# Patient Record
Sex: Male | Born: 1950 | Race: White | Hispanic: No | Marital: Married | Smoking: Former smoker
Health system: Southern US, Community
[De-identification: ages and names within clinical notes are randomized; demographics above are authoritative.]

## PROBLEM LIST (undated history)

## (undated) DIAGNOSIS — I10 Essential (primary) hypertension: Secondary | ICD-10-CM

## (undated) DIAGNOSIS — N189 Chronic kidney disease, unspecified: Secondary | ICD-10-CM

## (undated) DIAGNOSIS — M1990 Unspecified osteoarthritis, unspecified site: Secondary | ICD-10-CM

## (undated) DIAGNOSIS — I4891 Unspecified atrial fibrillation: Secondary | ICD-10-CM

## (undated) DIAGNOSIS — R7303 Prediabetes: Secondary | ICD-10-CM

## (undated) DIAGNOSIS — K651 Peritoneal abscess: Secondary | ICD-10-CM

## (undated) DIAGNOSIS — D62 Acute posthemorrhagic anemia: Secondary | ICD-10-CM

## (undated) DIAGNOSIS — E7800 Pure hypercholesterolemia, unspecified: Secondary | ICD-10-CM

## (undated) DIAGNOSIS — I499 Cardiac arrhythmia, unspecified: Secondary | ICD-10-CM

## (undated) HISTORY — DX: Peritoneal abscess: K65.1

## (undated) HISTORY — PX: FRACTURE SURGERY: SHX138

## (undated) HISTORY — PX: ANKLE FRACTURE SURGERY: SHX122

## (undated) HISTORY — PX: COLONOSCOPY W/ BIOPSIES AND POLYPECTOMY: SHX1376

## (undated) HISTORY — DX: Acute posthemorrhagic anemia: D62

---

## 2001-06-29 ENCOUNTER — Emergency Department (HOSPITAL_COMMUNITY): Admission: EM | Admit: 2001-06-29 | Discharge: 2001-06-29 | Payer: Self-pay | Admitting: Emergency Medicine

## 2012-06-24 ENCOUNTER — Encounter (HOSPITAL_COMMUNITY): Payer: Self-pay | Admitting: Anesthesiology

## 2012-06-24 ENCOUNTER — Ambulatory Visit (HOSPITAL_COMMUNITY): Payer: Self-pay

## 2012-06-24 ENCOUNTER — Ambulatory Visit (HOSPITAL_COMMUNITY): Payer: Self-pay | Admitting: Anesthesiology

## 2012-06-24 ENCOUNTER — Encounter (HOSPITAL_COMMUNITY)
Admission: RE | Disposition: A | Discharge status: Home / Self Care | Payer: Self-pay | Source: Ambulatory Visit | Attending: Orthopedic Surgery

## 2012-06-24 ENCOUNTER — Ambulatory Visit (HOSPITAL_COMMUNITY)
Admission: RE | Admit: 2012-06-24 | Discharge: 2012-06-24 | Disposition: A | Discharge status: Home / Self Care | Payer: Self-pay | Source: Ambulatory Visit | Attending: Orthopedic Surgery | Admitting: Orthopedic Surgery

## 2012-06-24 ENCOUNTER — Encounter (HOSPITAL_COMMUNITY): Payer: Self-pay

## 2012-06-24 DIAGNOSIS — W19XXXA Unspecified fall, initial encounter: Secondary | ICD-10-CM | POA: Insufficient documentation

## 2012-06-24 DIAGNOSIS — Z79899 Other long term (current) drug therapy: Secondary | ICD-10-CM | POA: Insufficient documentation

## 2012-06-24 DIAGNOSIS — J4489 Other specified chronic obstructive pulmonary disease: Secondary | ICD-10-CM | POA: Insufficient documentation

## 2012-06-24 DIAGNOSIS — I10 Essential (primary) hypertension: Secondary | ICD-10-CM | POA: Insufficient documentation

## 2012-06-24 DIAGNOSIS — S93439A Sprain of tibiofibular ligament of unspecified ankle, initial encounter: Secondary | ICD-10-CM | POA: Insufficient documentation

## 2012-06-24 DIAGNOSIS — J449 Chronic obstructive pulmonary disease, unspecified: Secondary | ICD-10-CM | POA: Insufficient documentation

## 2012-06-24 DIAGNOSIS — S82899A Other fracture of unspecified lower leg, initial encounter for closed fracture: Secondary | ICD-10-CM | POA: Insufficient documentation

## 2012-06-24 DIAGNOSIS — F17200 Nicotine dependence, unspecified, uncomplicated: Secondary | ICD-10-CM | POA: Insufficient documentation

## 2012-06-24 HISTORY — PX: ORIF FIBULA FRACTURE: SHX5114

## 2012-06-24 LAB — CBC
MCH: 34.9 pg — ABNORMAL HIGH (ref 26.0–34.0)
MCV: 96.5 fL (ref 78.0–100.0)
Platelets: 287 10*3/uL (ref 150–400)
RBC: 4.81 MIL/uL (ref 4.22–5.81)
RDW: 11.9 % (ref 11.5–15.5)
WBC: 9.9 10*3/uL (ref 4.0–10.5)

## 2012-06-24 LAB — HEPATIC FUNCTION PANEL
Albumin: 4 g/dL (ref 3.5–5.2)
Alkaline Phosphatase: 81 U/L (ref 39–117)
Bilirubin, Direct: <0.1 mg/dL (ref 0.0–0.3)
Total Bilirubin: 0.5 mg/dL (ref 0.3–1.2)

## 2012-06-24 LAB — SURGICAL PCR SCREEN: MRSA, PCR: NEGATIVE

## 2012-06-24 SURGERY — OPEN REDUCTION INTERNAL FIXATION (ORIF) FIBULA FRACTURE
Anesthesia: General | Site: Ankle | Laterality: Left | Wound class: Clean

## 2012-06-24 MED ORDER — DEXTROSE 5 % IV SOLN
3.0000 g | INTRAVENOUS | Status: AC
  Administered 2012-06-24: 3 g via INTRAVENOUS
  Filled 2012-06-24: qty 3000

## 2012-06-24 MED ORDER — MIDAZOLAM HCL 2 MG/2ML IJ SOLN
INTRAMUSCULAR | Status: AC
  Filled 2012-06-24: qty 2

## 2012-06-24 MED ORDER — LACTATED RINGERS IV SOLN: INTRAVENOUS | Status: DC

## 2012-06-24 MED ORDER — FENTANYL CITRATE 0.05 MG/ML IJ SOLN
INTRAMUSCULAR | Status: DC | PRN
  Administered 2012-06-24: 50 ug via INTRAVENOUS
  Administered 2012-06-24: 100 ug via INTRAVENOUS
  Administered 2012-06-24: 50 ug via INTRAVENOUS
  Administered 2012-06-24: 100 ug via INTRAVENOUS

## 2012-06-24 MED ORDER — BUPIVACAINE-EPINEPHRINE PF 0.5-1:200000 % IJ SOLN
INTRAMUSCULAR | Status: DC | PRN
  Administered 2012-06-24: 45 mL

## 2012-06-24 MED ORDER — FENTANYL CITRATE 0.05 MG/ML IJ SOLN
INTRAMUSCULAR | Status: AC
  Filled 2012-06-24: qty 2

## 2012-06-24 MED ORDER — ONDANSETRON HCL 4 MG/2ML IJ SOLN
INTRAMUSCULAR | Status: DC | PRN
  Administered 2012-06-24: 4 mg via INTRAVENOUS

## 2012-06-24 MED ORDER — LACTATED RINGERS IV SOLN
INTRAVENOUS | Status: DC | PRN
  Administered 2012-06-24 (×2): via INTRAVENOUS

## 2012-06-24 MED ORDER — MIDAZOLAM HCL 2 MG/2ML IJ SOLN: 1.0000 mg | INTRAMUSCULAR | Status: DC | PRN

## 2012-06-24 MED ORDER — HYDROMORPHONE HCL PF 1 MG/ML IJ SOLN
INTRAMUSCULAR | Status: AC
  Filled 2012-06-24: qty 1

## 2012-06-24 MED ORDER — 0.9 % SODIUM CHLORIDE (POUR BTL) OPTIME
TOPICAL | Status: DC | PRN
  Administered 2012-06-24: 1000 mL

## 2012-06-24 MED ORDER — ONDANSETRON HCL 4 MG PO TABS: 4.0000 mg | ORAL_TABLET | Freq: Three times a day (TID) | ORAL | Status: DC | PRN

## 2012-06-24 MED ORDER — MUPIROCIN 2 % EX OINT
TOPICAL_OINTMENT | CUTANEOUS | Status: AC
  Administered 2012-06-24: 1 via NASAL
  Filled 2012-06-24: qty 22

## 2012-06-24 MED ORDER — NEOSTIGMINE METHYLSULFATE 1 MG/ML IJ SOLN
INTRAMUSCULAR | Status: DC | PRN
  Administered 2012-06-24: 3 mg via INTRAVENOUS

## 2012-06-24 MED ORDER — LIDOCAINE HCL (CARDIAC) 20 MG/ML IV SOLN
INTRAVENOUS | Status: DC | PRN
  Administered 2012-06-24: 100 mg via INTRAVENOUS

## 2012-06-24 MED ORDER — PROPOFOL 10 MG/ML IV BOLUS
INTRAVENOUS | Status: DC | PRN
  Administered 2012-06-24: 180 mg via INTRAVENOUS
  Administered 2012-06-24: 100 mg via INTRAVENOUS
  Administered 2012-06-24: 60 mg via INTRAVENOUS

## 2012-06-24 MED ORDER — DEXAMETHASONE SODIUM PHOSPHATE 4 MG/ML IJ SOLN
INTRAMUSCULAR | Status: DC | PRN
  Administered 2012-06-24: 4 mg

## 2012-06-24 MED ORDER — MIDAZOLAM HCL 5 MG/5ML IJ SOLN
INTRAMUSCULAR | Status: DC | PRN
  Administered 2012-06-24: 2 mg via INTRAVENOUS

## 2012-06-24 MED ORDER — HYDROMORPHONE HCL PF 1 MG/ML IJ SOLN
0.2500 mg | INTRAMUSCULAR | Status: DC | PRN
  Administered 2012-06-24 (×2): 0.5 mg via INTRAVENOUS

## 2012-06-24 MED ORDER — OXYCODONE-ACETAMINOPHEN 5-325 MG PO TABS: 1.0000 | ORAL_TABLET | Freq: Four times a day (QID) | ORAL | Status: DC | PRN

## 2012-06-24 MED ORDER — FENTANYL CITRATE 0.05 MG/ML IJ SOLN: 50.0000 ug | Freq: Once | INTRAMUSCULAR | Status: DC

## 2012-06-24 MED ORDER — ROCURONIUM BROMIDE 100 MG/10ML IV SOLN
INTRAVENOUS | Status: DC | PRN
  Administered 2012-06-24: 50 mg via INTRAVENOUS
  Administered 2012-06-24: 10 mg via INTRAVENOUS

## 2012-06-24 MED ORDER — MUPIROCIN 2 % EX OINT
TOPICAL_OINTMENT | Freq: Two times a day (BID) | CUTANEOUS | Status: DC
Start: 2012-06-24 — End: 2012-06-24
  Administered 2012-06-24: 1 via NASAL

## 2012-06-24 MED ORDER — PROMETHAZINE HCL 25 MG/ML IJ SOLN: 6.2500 mg | INTRAMUSCULAR | Status: DC | PRN

## 2012-06-24 MED ORDER — OXYCODONE HCL 5 MG PO TABS: 5.0000 mg | ORAL_TABLET | Freq: Once | ORAL | Status: DC | PRN

## 2012-06-24 MED ORDER — OXYCODONE HCL 5 MG/5ML PO SOLN: 5.0000 mg | Freq: Once | ORAL | Status: DC | PRN

## 2012-06-24 MED ORDER — OXYCODONE HCL 5 MG PO TABS
5.0000 mg | ORAL_TABLET | ORAL | Status: DC | PRN
Start: 2012-06-24 — End: 2013-04-24

## 2012-06-24 MED ORDER — GLYCOPYRROLATE 0.2 MG/ML IJ SOLN
INTRAMUSCULAR | Status: DC | PRN
  Administered 2012-06-24: 0.4 mg via INTRAVENOUS

## 2012-06-24 SURGICAL SUPPLY — 79 items
BANDAGE ELASTIC 4 VELCRO ST LF (GAUZE/BANDAGES/DRESSINGS) ×2 IMPLANT
BANDAGE ELASTIC 6 VELCRO ST LF (GAUZE/BANDAGES/DRESSINGS) ×2 IMPLANT
BANDAGE ESMARK 6X9 LF (GAUZE/BANDAGES/DRESSINGS) ×1 IMPLANT
BANDAGE GAUZE ELAST BULKY 4 IN (GAUZE/BANDAGES/DRESSINGS) ×3 IMPLANT
BIT DRILL 2.5X110 QC LCP DISP (BIT) ×1 IMPLANT
BNDG CMPR 9X6 STRL LF SNTH (GAUZE/BANDAGES/DRESSINGS) ×1
BNDG COHESIVE 6X5 TAN STRL LF (GAUZE/BANDAGES/DRESSINGS) ×1 IMPLANT
BNDG ESMARK 6X9 LF (GAUZE/BANDAGES/DRESSINGS) ×2
BRUSH SCRUB DISP (MISCELLANEOUS) ×4 IMPLANT
CLEANER TIP ELECTROSURG 2X2 (MISCELLANEOUS) ×2 IMPLANT
CLOTH BEACON ORANGE TIMEOUT ST (SAFETY) ×2 IMPLANT
COVER MAYO STAND STRL (DRAPES) IMPLANT
COVER SURGICAL LIGHT HANDLE (MISCELLANEOUS) ×4 IMPLANT
CUFF TOURNIQUET SINGLE 18IN (TOURNIQUET CUFF) IMPLANT
CUFF TOURNIQUET SINGLE 24IN (TOURNIQUET CUFF) IMPLANT
DECANTER SPIKE VIAL GLASS SM (MISCELLANEOUS) IMPLANT
DRAPE C-ARM 42X72 X-RAY (DRAPES) IMPLANT
DRAPE C-ARMOR (DRAPES) ×2 IMPLANT
DRAPE OEC MINIVIEW 54X84 (DRAPES) ×2 IMPLANT
DRAPE ORTHO SPLIT 77X108 STRL (DRAPES) ×2
DRAPE SURG ORHT 6 SPLT 77X108 (DRAPES) ×1 IMPLANT
DRAPE U-SHAPE 47X51 STRL (DRAPES) ×2 IMPLANT
DRSG ADAPTIC 3X8 NADH LF (GAUZE/BANDAGES/DRESSINGS) ×2 IMPLANT
ELECT REM PT RETURN 9FT ADLT (ELECTROSURGICAL) ×2
ELECTRODE REM PT RTRN 9FT ADLT (ELECTROSURGICAL) ×1 IMPLANT
EVACUATOR 1/8 PVC DRAIN (DRAIN) IMPLANT
GLOVE BIO SURGEON STRL SZ7.5 (GLOVE) ×2 IMPLANT
GLOVE BIO SURGEON STRL SZ8 (GLOVE) ×2 IMPLANT
GLOVE BIOGEL PI IND STRL 7.5 (GLOVE) ×1 IMPLANT
GLOVE BIOGEL PI IND STRL 8 (GLOVE) ×1 IMPLANT
GLOVE BIOGEL PI INDICATOR 7.5 (GLOVE) ×1
GLOVE BIOGEL PI INDICATOR 8 (GLOVE) ×1
GOWN PREVENTION PLUS XLARGE (GOWN DISPOSABLE) ×2 IMPLANT
GOWN PREVENTION PLUS XXLARGE (GOWN DISPOSABLE) ×2 IMPLANT
GOWN STRL NON-REIN LRG LVL3 (GOWN DISPOSABLE) ×4 IMPLANT
KIT 1/3 TUB PL 7H 85M (Orthopedic Implant) IMPLANT
KIT BASIN OR (CUSTOM PROCEDURE TRAY) ×2 IMPLANT
KIT ROOM TURNOVER OR (KITS) ×2 IMPLANT
MANIFOLD NEPTUNE II (INSTRUMENTS) ×2 IMPLANT
NEEDLE 22X1 1/2 (OR ONLY) (NEEDLE) IMPLANT
NS IRRIG 1000ML POUR BTL (IV SOLUTION) ×2 IMPLANT
PACK ORTHO EXTREMITY (CUSTOM PROCEDURE TRAY) ×2 IMPLANT
PAD ARMBOARD 7.5X6 YLW CONV (MISCELLANEOUS) ×4 IMPLANT
PAD CAST 4YDX4 CTTN HI CHSV (CAST SUPPLIES) IMPLANT
PADDING CAST COTTON 4X4 STRL (CAST SUPPLIES) ×4
PADDING CAST COTTON 6X4 STRL (CAST SUPPLIES) ×4 IMPLANT
PROS 1/3 TUB PL 7H 85M (Orthopedic Implant) ×2 IMPLANT
SCREW CANC FT/18 4.0 (Screw) ×1 IMPLANT
SCREW CORTEX 3.5 12MM (Screw) ×2 IMPLANT
SCREW CORTEX 3.5 14MM (Screw) ×1 IMPLANT
SCREW CORTEX 3.5 40MM (Screw) ×1 IMPLANT
SCREW CORTEX 3.5 45MM (Screw) ×1 IMPLANT
SCREW LOCK CORT ST 3.5X12 (Screw) IMPLANT
SCREW LOCK CORT ST 3.5X14 (Screw) IMPLANT
SCREW LOCK CORT ST 3.5X40 (Screw) IMPLANT
SPLINT PLASTER CAST XFAST 5X30 (CAST SUPPLIES) IMPLANT
SPLINT PLASTER XFAST SET 5X30 (CAST SUPPLIES) ×1
SPONGE GAUZE 4X4 12PLY (GAUZE/BANDAGES/DRESSINGS) ×2 IMPLANT
SPONGE LAP 18X18 X RAY DECT (DISPOSABLE) ×2 IMPLANT
SPONGE SCRUB IODOPHOR (GAUZE/BANDAGES/DRESSINGS) ×2 IMPLANT
STAPLER VISISTAT 35W (STAPLE) IMPLANT
STOCKINETTE IMPERVIOUS LG (DRAPES) ×2 IMPLANT
STRIP CLOSURE SKIN 1/2X4 (GAUZE/BANDAGES/DRESSINGS) IMPLANT
SUCTION FRAZIER TIP 10 FR DISP (SUCTIONS) IMPLANT
SUT ETHILON 3 0 PS 1 (SUTURE) IMPLANT
SUT PDS AB 2-0 CT1 27 (SUTURE) IMPLANT
SUT PROLENE 0 CT 1 30 (SUTURE) ×4 IMPLANT
SUT VIC AB 0 CT1 27 (SUTURE) ×4
SUT VIC AB 0 CT1 27XBRD ANBCTR (SUTURE) ×2 IMPLANT
SUT VIC AB 2-0 CT1 27 (SUTURE) ×4
SUT VIC AB 2-0 CT1 TAPERPNT 27 (SUTURE) ×2 IMPLANT
SYR CONTROL 10ML LL (SYRINGE) IMPLANT
TOWEL OR 17X24 6PK STRL BLUE (TOWEL DISPOSABLE) ×4 IMPLANT
TOWEL OR 17X26 10 PK STRL BLUE (TOWEL DISPOSABLE) ×4 IMPLANT
TRAY FOLEY CATH 14FR (SET/KITS/TRAYS/PACK) ×2 IMPLANT
TUBE CONNECTING 12X1/4 (SUCTIONS) ×2 IMPLANT
UNDERPAD 30X30 INCONTINENT (UNDERPADS AND DIAPERS) ×2 IMPLANT
WATER STERILE IRR 1000ML POUR (IV SOLUTION) ×4 IMPLANT
YANKAUER SUCT BULB TIP NO VENT (SUCTIONS) ×2 IMPLANT

## 2012-06-24 NOTE — H&P (Signed)
[facility] H&P  Chief Complaint: L ankle fracture HPI:  62 y/o male sustained a L ankle fx approximately 10 days ago. Seen as an outpatient.  Had to wait to allow swelling to resolve.  Pt smokes > 1 ppd. Presents today for ORIF of L ankle  PMH  HTN  No past surgical history on file.  FHX  noncontributory  Social History:  Pt works as a Clinical cytogeneticist   Allergies: No Known Allergies  Medications Prior to Admission  Medication Sig Dispense Refill  . losartan-hydrochlorothiazide (HYZAAR) 100-12.5 MG per tablet Take 1 tablet by mouth daily.      [name] oxyCODONE-acetaminophen (PERCOCET/ROXICET) 5-325 MG per tablet Take 1 tablet by mouth every 4 (four) hours as needed. Takes for sleep not for pain      Has not take antihypertensive several weeks   Review of Systems  Constitutional: Negative for fever and chills.  Eyes: Negative for blurred vision.  Respiratory: Negative for shortness of breath and wheezing.   Cardiovascular: Negative for chest pain.  Gastrointestinal: Negative for nausea, vomiting and abdominal pain.  Musculoskeletal:       L ankle pain  Neurological: Negative for dizziness, tingling and headaches.  Psychiatric/Behavioral: Positive for hallucinations.       Had hallucinations on Norco    Blood pressure 143/91, pulse 78, temperature 97.6 F (36.4 C), temperature source Oral, resp. rate 18, height 6\' 1"  (1.854 m), weight 83.66 kg (184 lb 7 oz), SpO2 97.00%. Physical Exam  Constitutional: He is oriented to person, place, and time. He appears well-developed and well-nourished. He is cooperative. No distress.       Pt noted to be hypertensive. Pt was on meds but has stopped to save money  HENT:  Head: Normocephalic and atraumatic.  Cardiovascular: Normal rate, regular rhythm, S1 normal and S2 normal.   Respiratory:       Dec at bases   GI:       Soft, NT, + BS  Musculoskeletal:       Left Leg    Splint to L ankle     +  Swelling     Skin stable     + ecchymosis     Motor and sensory functions intact     Ext warm     + pulse noted  Neurological: He is alert and oriented to person, place, and time.     xrays   Office films show displaced weber C ankle fx w/o clear evidence of syndesmotic injury  Assessment/Plan  62 y/o male with L ankle fracture  Weber C L ankle fx   OR for ORIF   Likely dc home after surgery vs overnight observation  Risks and benefits discussed with pt including consequences of continued nicotine use during recovery period.  Pt understands and accepts these risks and wishes to proceed.   Mearl Latin, PA-C [facility] [phone] (P) 06/24/2012, 11:11 AM

## 2012-06-24 NOTE — Preoperative (Signed)
Beta Blockers   Reason not to administer Beta Blockers:Not Applicable 

## 2012-06-24 NOTE — Transfer of Care (Signed)
Immediate Anesthesia Transfer of Care Note  Patient: Travis Conrad  Procedure(s) Performed: Procedure(s) (LRB) with comments: OPEN REDUCTION INTERNAL FIXATION (ORIF) FIBULA FRACTURE (Left) - ORIF LEFT FIBULA,  POSSIBLE REPAIR OF SYNDESMOSIS   Patient Location: PACU  Anesthesia Type:GA combined with regional for post-op pain  Level of Consciousness: awake, alert , oriented and patient cooperative  Airway & Oxygen Therapy: Patient Spontanous Breathing and Patient connected to face mask oxygen  Post-op Assessment: Report given to PACU RN, Post -op Vital signs reviewed and stable and Patient moving all extremities  Post vital signs: Reviewed and stable  Complications: No apparent anesthesia complications

## 2012-06-24 NOTE — Anesthesia Preprocedure Evaluation (Signed)
Anesthesia Evaluation  Patient identified by MRN, date of birth, ID band Patient awake    Reviewed: Allergy & Precautions, H&P , NPO status , Patient's Chart, lab work & pertinent test results  Airway Mallampati: I TM Distance: >3 FB Neck ROM: Full    Dental   Pulmonary COPDCurrent Smoker,  + rhonchi         Cardiovascular hypertension, Rhythm:Regular Rate:Normal     Neuro/Psych    GI/Hepatic   Endo/Other    Renal/GU      Musculoskeletal   Abdominal   Peds  Hematology   Anesthesia Other Findings   Reproductive/Obstetrics                           Anesthesia Physical Anesthesia Plan  ASA: II  Anesthesia Plan: General   Post-op Pain Management:    Induction: Intravenous  Airway Management Planned: Oral ETT  Additional Equipment:   Intra-op Plan:   Post-operative Plan: Extubation in OR  Informed Consent: I have reviewed the patients History and Physical, chart, labs and discussed the procedure including the risks, benefits and alternatives for the proposed anesthesia with the patient or authorized representative who has indicated his/her understanding and acceptance.     Plan Discussed with: CRNA and Surgeon  Anesthesia Plan Comments:         Anesthesia Quick Evaluation

## 2012-06-24 NOTE — Anesthesia Procedure Notes (Addendum)
Anesthesia Regional Block:  Popliteal block  Pre-Anesthetic Checklist: ,, timeout performed, Correct Patient, Correct Site, Correct Laterality, Correct Procedure, Correct Position, site marked, Risks and benefits discussed,  Surgical consent,  Pre-op evaluation,  At surgeon's request and post-op pain management  Laterality: Left  Prep: chloraprep       Needles:  Injection technique: Single-shot  Needle Type: Echogenic Stimulator Needle      Needle Gauge: 22 and 22 G    Additional Needles:  Procedures: ultrasound guided (picture in chart) and nerve stimulator Popliteal block  Nerve Stimulator or Paresthesia:  Response: 0.5 mA,   Additional Responses:   Narrative:  Start time: 06/24/2012 1:10 PM End time: 06/24/2012 1:28 PM Injection made incrementally with aspirations every 5 mL. Anesthesiologist: Dr Gypsy Balsam  Additional Notes: 4132-4401 L Pop Nerve Block POP CHG prep, sterile tech US guidance, nerve stim set at .5ma [place] Pix in chart Vernia Buff .5% w/epi 1:200000 total45cc+decadron 4mg  infiltrated Multiple neg asp No compl Dr Gypsy Balsam   Procedure Name: Intubation Date/Time: 06/24/2012 1:45 PM Performed by: Jerilee Hoh Pre-anesthesia Checklist: Emergency Drugs available, Patient identified, Patient being monitored and Suction available Patient Re-evaluated:Patient Re-evaluated prior to inductionOxygen Delivery Method: Circle system utilized Preoxygenation: Pre-oxygenation with 100% oxygen Intubation Type: IV induction Ventilation: Mask ventilation without difficulty and Oral airway inserted - appropriate to patient size Laryngoscope Size: Mac and 4 Grade View: Grade I Tube type: Oral Tube size: 7.5 mm Number of attempts: 1 Airway Equipment and Method: Stylet Placement Confirmation: ETT inserted through vocal cords under direct vision,  positive ETCO2 and breath sounds checked- equal and bilateral Secured at: 22 cm Tube secured with: Tape Dental Injury: Teeth and  Oropharynx as per pre-operative assessment

## 2012-06-24 NOTE — Anesthesia Postprocedure Evaluation (Signed)
  Anesthesia Post-op Note  Patient: Travis Conrad  Procedure(s) Performed: Procedure(s) (LRB) with comments: OPEN REDUCTION INTERNAL FIXATION (ORIF) FIBULA FRACTURE (Left) - ORIF LEFT FIBULA,  POSSIBLE REPAIR OF SYNDESMOSIS   Patient Location: PACU  Anesthesia Type:GA combined with regional for post-op pain  Level of Consciousness: awake and alert   Airway and Oxygen Therapy: Patient Spontanous Breathing  Post-op Pain: mild  Post-op Assessment: Post-op Vital signs reviewed, Patient's Cardiovascular Status Stable, Respiratory Function Stable, Patent Airway, No signs of Nausea or vomiting and Pain level controlled  Post-op Vital Signs: stable  Complications: No apparent anesthesia complications

## 2012-06-24 NOTE — Brief Op Note (Signed)
06/24/2012  3:36 PM  PATIENT:  Travis Conrad  62 y.o. male  PRE-OPERATIVE DIAGNOSIS:  left fibula fracture,  syndesmosis   POST-OPERATIVE DIAGNOSIS:  left fibula fracture,  syndesmosis   PROCEDURE:  ORIF lateral malleolus  ORIF syndesmosis left  SURGEON:  Surgeon(s) and Role:    * Budd Palmer, MD - Primary  ANESTHESIA:   general  EBL:  Total I/O In: 1400 [I.V.:1400] Out: 50 [Blood:50]  BLOOD ADMINISTERED:none  DRAINS: none   LOCAL MEDICATIONS USED:  NONE  SPECIMEN:  No Specimen  DISPOSITION OF SPECIMEN:  N/A  COUNTS:  YES  TOURNIQUET:  * No tourniquets in log *  DICTATION: .Dragon Dictation and Other Dictation: Dictation Number [id number]  PLAN OF CARE: Discharge to home after PACU  PATIENT DISPOSITION:  PACU - hemodynamically stable.   Delay start of Pharmacological VTE agent (>24hrs) due to surgical blood loss or risk of bleeding: no

## 2012-06-25 ENCOUNTER — Encounter (HOSPITAL_COMMUNITY): Payer: Self-pay | Admitting: Orthopedic Surgery

## 2012-06-25 NOTE — Op Note (Signed)
NAME:  Travis Conrad, Travis Conrad NO.:  [id number]  MEDICAL RECORD NO.:  [id number]  LOCATION:  [facility]                         FACILITY:  [facility]  PHYSICIAN:  Doralee Albino. Carola Frost, M.D. DATE OF BIRTH:  [birth year]  DATE OF PROCEDURE:  06/24/2012 DATE OF DISCHARGE:  06/24/2012                              OPERATIVE REPORT   PREOPERATIVE DIAGNOSIS:  Weber C left ankle fracture.  POSTOPERATIVE DIAGNOSES: 1. Weber C lateral malleolus fracture. 2. Disrupted syndesmosis.  PROCEDURES: 1. Open reduction and internal fixation of left lateral malleolus. 2. Open reduction and internal fixation of left ankle syndesmosis. 3. Stress fluoroscopy, left ankle.  SURGEON:  Doralee Albino. Carola Frost, M.D.  ASSISTANT:  None.  ANESTHESIA:  General supplemented with popliteal block.  COMPLICATIONS:  None.  TOURNIQUET:  None.  ESTIMATED BLOOD LOSS:  Minimal.  DISPOSITION:  To PACU.  CONDITION:  Stable.  BRIEF SUMMARY OF INDICATIONS FOR PROCEDURE:  [name] is a 62 year old male who sustained a left ankle fracture in a fall.  He was initially seen, evaluated and underwent a closed reduction, and a posterior stirrup splinting, followed up in the office, where we discussed the risks and benefits of surgery including the possibility of infection, nerve injury, vessel injury, DVT, PE, heart attack, stroke, the need for intraoperative stress evaluation of the syndesmotic ligament with possible ORIF in the event it was ruptured as anticipated. He understood these risks, and understood the risk of subsequent hardware breakage, possibility of need for removal and many others including heart attack and stroke and did wish to proceed.  BRIEF DESCRIPTION OF PROCEDURE:  [name] was given 3 g of Ancef, taken to the operating room where general anesthesia was induced.  His left lower extremity was prepped and draped in usual sterile fashion. No tourniquet was used during the procedure.  Standard  lateral approach was made to the fibula retracting the superficial peroneal nerve anteriorly.  The fracture site was exposed, cleaned with a curette, lavaged, and then reduced and held using a 7 hole 1/3rd tubular plate, placed posterolaterally for buttress.  This reduction was checked on multiple views with C-arm, secured with 3 standard screws proximally, one distal screw.  The fracture site was seen interdigitate anteriorly and was comminuted posteriorly.  The ankle joint was then stressed and this resulted in a widened syndesmosis and an increase in the medial clear space consistent with disruption and instability of the syndesmotic ligament.  This was then followed with placement of 2 syndesmotic screws.  The patient tolerated the procedure very well and was taken to the PACU in stable condition after standard layered closure and placement of a posterior stirrup splint.  No complications occurred during the case.  PROGNOSIS:  [name] will be nonweightbearing for the next 8 weeks. We anticipate and return to the office in 2 weeks for removal of sutures and then graduated weightbearing again beginning at 8 weeks with unrestricted range of motion after removal of his splint and conversion into a Cam boot.  He will be on DVT prophylaxis with Lovenox and if this is financially not feasible aspirin.     Doralee Albino. Carola Frost, M.D.     [facility]/MEDQ  D:  06/24/2012  T:  06/25/2012  Job:  [id number]

## 2012-07-01 NOTE — H&P (Signed)
I have seen and examined the patient. I agree with the findings above.  I discussed with the patient the risks and benefits of surgery, including the possibility of infection, nerve injury, vessel injury, wound breakdown, arthritis, symptomatic hardware, DVT/ PE, loss of motion, and need for further surgery among others.  He understood these risks and wished to proceed.   Budd Palmer, MD

## 2013-04-24 ENCOUNTER — Encounter (HOSPITAL_COMMUNITY): Payer: Self-pay | Admitting: Emergency Medicine

## 2013-04-24 ENCOUNTER — Emergency Department (HOSPITAL_COMMUNITY): Payer: BC Managed Care – PPO

## 2013-04-24 ENCOUNTER — Emergency Department (HOSPITAL_COMMUNITY)
Admission: EM | Admit: 2013-04-24 | Discharge: 2013-04-24 | Disposition: A | Discharge status: Home / Self Care | Payer: BC Managed Care – PPO | Attending: Emergency Medicine | Admitting: Emergency Medicine

## 2013-04-24 DIAGNOSIS — R079 Chest pain, unspecified: Secondary | ICD-10-CM | POA: Insufficient documentation

## 2013-04-24 DIAGNOSIS — W19XXXA Unspecified fall, initial encounter: Secondary | ICD-10-CM | POA: Insufficient documentation

## 2013-04-24 DIAGNOSIS — S2231XA Fracture of one rib, right side, initial encounter for closed fracture: Secondary | ICD-10-CM

## 2013-04-24 DIAGNOSIS — S2239XA Fracture of one rib, unspecified side, initial encounter for closed fracture: Secondary | ICD-10-CM | POA: Insufficient documentation

## 2013-04-24 DIAGNOSIS — Y929 Unspecified place or not applicable: Secondary | ICD-10-CM | POA: Insufficient documentation

## 2013-04-24 DIAGNOSIS — Y939 Activity, unspecified: Secondary | ICD-10-CM | POA: Insufficient documentation

## 2013-04-24 DIAGNOSIS — F17200 Nicotine dependence, unspecified, uncomplicated: Secondary | ICD-10-CM | POA: Insufficient documentation

## 2013-04-24 MED ORDER — OXYCODONE-ACETAMINOPHEN 5-325 MG PO TABS
2.0000 | ORAL_TABLET | Freq: Once | ORAL | Status: AC
  Administered 2013-04-24: 2 via ORAL
  Filled 2013-04-24: qty 2

## 2013-04-24 MED ORDER — HYDROCODONE-ACETAMINOPHEN 5-325 MG PO TABS: 1.0000 | ORAL_TABLET | Freq: Four times a day (QID) | ORAL | Status: DC | PRN

## 2013-04-24 MED ORDER — DIAZEPAM 5 MG/ML IJ SOLN
5.0000 mg | Freq: Once | INTRAMUSCULAR | Status: AC
  Administered 2013-04-24: 5 mg via INTRAVENOUS
  Filled 2013-04-24: qty 2

## 2013-04-24 MED ORDER — OXYCODONE-ACETAMINOPHEN 5-325 MG PO TABS: 1.0000 | ORAL_TABLET | Freq: Four times a day (QID) | ORAL | Status: DC | PRN

## 2013-04-24 MED ORDER — DIAZEPAM 5 MG PO TABS: 5.0000 mg | ORAL_TABLET | Freq: Three times a day (TID) | ORAL | Status: DC | PRN

## 2013-04-24 MED ORDER — HYDROMORPHONE HCL PF 1 MG/ML IJ SOLN
1.0000 mg | Freq: Once | INTRAMUSCULAR | Status: AC
  Administered 2013-04-24: 1 mg via INTRAVENOUS
  Filled 2013-04-24: qty 1

## 2013-04-24 MED ORDER — KETOROLAC TROMETHAMINE 30 MG/ML IJ SOLN
30.0000 mg | Freq: Once | INTRAMUSCULAR | Status: AC
  Administered 2013-04-24: 30 mg via INTRAVENOUS
  Filled 2013-04-24: qty 1

## 2013-04-24 NOTE — ED Provider Notes (Signed)
CSN: [id number]     Arrival date & time 04/24/13  1243 History   First MD Initiated Contact with Patient 04/24/13 1250     Chief Complaint  Patient presents with  . Rib Injury   (Consider location/radiation/quality/duration/timing/severity/associated sxs/prior Treatment) Patient is a 62 y.o. male presenting with fall. The history is provided by the patient.  Fall This is a new problem. The current episode started more than 2 days ago. Episode frequency: once. The problem has not changed since onset.Associated symptoms include chest pain (posterior, with movement). Pertinent negatives include no shortness of breath. The symptoms are aggravated by twisting (certain movements). Nothing relieves the symptoms. He has tried nothing for the symptoms.    History reviewed. No pertinent past medical history. Past Surgical History  Procedure Laterality Date  . Colonoscopy  >10 years  . Orif fibula fracture  06/24/2012    Procedure: OPEN REDUCTION INTERNAL FIXATION (ORIF) FIBULA FRACTURE;  Surgeon: Budd Palmer, MD;  Location: [facility] OR;  Service: Orthopedics;  Laterality: Left;  ORIF LEFT FIBULA,  POSSIBLE REPAIR OF SYNDESMOSIS    Family History  Problem Relation Age of Onset  . Other Brother    History  Substance Use Topics  . Smoking status: Current Every Day Smoker -- 1.00 packs/day for 40 years    Types: Cigarettes  . Smokeless tobacco: Not on file  . Alcohol Use: 3.6 oz/week    6 Shots of liquor per week     Comment: 1 pint /day. 2 weeks ago decreased to 1/2 pint per day    Review of Systems  Constitutional: Negative for fever.  Respiratory: Negative for cough and shortness of breath.   Cardiovascular: Positive for chest pain (posterior, with movement).  All other systems reviewed and are negative.    Allergies  Review of patient's allergies indicates no known allergies.  Home Medications   Current Outpatient Rx  Name  Route  Sig  Dispense  Refill  . diazepam (VALIUM) 5 MG  tablet   Oral   Take 1 tablet (5 mg total) by mouth every 8 (eight) hours as needed for muscle spasms (spasms).   10 tablet   0   . oxyCODONE-acetaminophen (PERCOCET/ROXICET) 5-325 MG per tablet   Oral   Take 1 tablet by mouth every 6 (six) hours as needed for severe pain.   30 tablet   0    BP 116/63  Pulse 70  Temp(Src) 98.1 F (36.7 C) (Oral)  Resp 20  SpO2 93% Physical Exam  Nursing note and vitals reviewed. Constitutional: He is oriented to person, place, and time. He appears well-developed and well-nourished. No distress.  HENT:  Head: Normocephalic and atraumatic.  Mouth/Throat: No oropharyngeal exudate.  Eyes: EOM are normal. Pupils are equal, round, and reactive to light.  Neck: Normal range of motion. Neck supple.  Cardiovascular: Normal rate and regular rhythm.  Exam reveals no friction rub.   No murmur heard. Pulmonary/Chest: Effort normal and breath sounds normal. No respiratory distress. He has no wheezes. He has no rales. He exhibits tenderness (R posterior tenderness, lower, no paradoxical motion or flail segment identified.).  Abdominal: He exhibits no distension. There is no tenderness. There is no rebound.  Musculoskeletal: Normal range of motion. He exhibits no edema.  Neurological: He is alert and oriented to person, place, and time.  Skin: He is not diaphoretic.    ED Course  Procedures (including critical care time) Labs Review Labs Reviewed - No data to display Imaging Review  Dg Chest 2 View  04/24/2013   CLINICAL DATA:  Rib injury. Fell 4 days ago was slight right rib pain.  EXAM: CHEST  2 VIEW  COMPARISON:  Chest radiograph 06/24/2012 and right rib series 11 11/2012  FINDINGS: The heart size and mediastinal contours are within normal limits. Both lungs are clear. The visualized skeletal structures are unremarkable. No displaced rib fracture is identified on this chest radiograph. Atherosclerotic calcification of the thoracic aortic arch is noted.   IMPRESSION: 1. No acute cardiopulmonary disease.  2.  Atherosclerotic calcification of the thoracic aortic arch.   Electronically Signed   By: Britta Mccreedy M.D.   On: 04/24/2013 14:04   Dg Ribs Unilateral Right  04/24/2013   CLINICAL DATA:  The status post fall 4 days prior  EXAM: RIGHT RIBS - 2 VIEW  COMPARISON:  None.  FINDINGS: A posterior rib fracture with callus formation is appreciated involving the 7th rib. A 2nd fracture is identified along the posterior aspect of the 8th rib. It is not appear to be significant callus formation about the 8th rib fracture.  IMPRESSION: 1. Findings concerning for nondisplaced fracture along the posterior aspect of 8th rib without significant callus formation suggesting an acute/subacute findings 2. Callus formation about a posterior 7th rib fracture these findings are on the left. For   Electronically Signed   By: Salome Holmes M.D.   On: 04/24/2013 14:10    EKG Interpretation   None       MDM   1. Rib fracture, right, closed, initial encounter    49M here with posterior chest pain. Fell 3 days ago, hit it during the fall. Rolled over today and noticed worsening pain. No pain with deep breathing, no SOB, however worse with movements. No fevers. Here relaxing comfortably, R posterior lower rib cage tenderness to palpation. Patient given fentanyl en route. Patient here with stable vitals, clear lungs, no abdominal pain. CXR shows R posterior 8th rib fracture. Given percocet without relief. Dilaudid and toradol given. Patient given Rx for percocet and valium to go home.     Dagmar Hait, MD 04/24/13 [phone]

## 2013-04-24 NOTE — ED Notes (Signed)
Pt fell 3 days ago injuring lower back. Pt states that she was laying supine when he turned and "heard a crack". Pt states that he thinks he "broke a rib" Pt was given 250 mvg of fentayl en route.

## 2013-04-25 ENCOUNTER — Emergency Department (HOSPITAL_COMMUNITY)
Admission: EM | Admit: 2013-04-25 | Discharge: 2013-04-25 | Disposition: A | Discharge status: Home / Self Care | Payer: BC Managed Care – PPO | Attending: Emergency Medicine | Admitting: Emergency Medicine

## 2013-04-25 DIAGNOSIS — G8911 Acute pain due to trauma: Secondary | ICD-10-CM | POA: Insufficient documentation

## 2013-04-25 DIAGNOSIS — R079 Chest pain, unspecified: Secondary | ICD-10-CM | POA: Insufficient documentation

## 2013-04-25 DIAGNOSIS — R109 Unspecified abdominal pain: Secondary | ICD-10-CM | POA: Insufficient documentation

## 2013-04-25 DIAGNOSIS — F17200 Nicotine dependence, unspecified, uncomplicated: Secondary | ICD-10-CM | POA: Insufficient documentation

## 2013-04-25 DIAGNOSIS — S2231XS Fracture of one rib, right side, sequela: Secondary | ICD-10-CM

## 2013-04-25 MED ORDER — DIAZEPAM 5 MG/ML IJ SOLN
5.0000 mg | Freq: Once | INTRAMUSCULAR | Status: AC
  Administered 2013-04-25: 5 mg via INTRAMUSCULAR
  Filled 2013-04-25: qty 2

## 2013-04-25 MED ORDER — KETOROLAC TROMETHAMINE 60 MG/2ML IM SOLN
60.0000 mg | Freq: Once | INTRAMUSCULAR | Status: AC
  Administered 2013-04-25: 60 mg via INTRAMUSCULAR
  Filled 2013-04-25: qty 2

## 2013-04-25 NOTE — ED Notes (Signed)
Vitals taken by [employer] ([employer] Care[employer] Student)

## 2013-04-25 NOTE — ED Provider Notes (Signed)
CSN: [id number]     Arrival date & time 04/25/13  1300 History   First MD Initiated Contact with Patient 04/25/13 1307     Chief Complaint  Patient presents with  . Flank Pain   (Consider location/radiation/quality/duration/timing/severity/associated sxs/prior Treatment) The history is provided by the patient and medical records.   This is a 29 male with significant history presenting to the ED for right posterior rib pain. Patient states he fell proximally 4 days ago while going down his stairs. He denies head trauma or loss of consciousness at that time. Patient was seen in the ED yesterday and diagnosed with a 8th posterior rib fracture. He was treated in the ED and given home pain medications. States he's been taking the medications without noted improvement. He states he's had difficulty walking around his house, turning in bed, and sitting upright due to pain. Denies new injury or trauma to the ribs.  Denies any chest pain, palpitations, or shortness of breath.  Per EMS report, pt stated in the ambulance "they gave me dilaudid yesterday and i need that again."  VS stable on arrival.  No past medical history on file. Past Surgical History  Procedure Laterality Date  . Colonoscopy  >10 years  . Orif fibula fracture  06/24/2012    Procedure: OPEN REDUCTION INTERNAL FIXATION (ORIF) FIBULA FRACTURE;  Surgeon: Budd Palmer, MD;  Location: [facility] OR;  Service: Orthopedics;  Laterality: Left;  ORIF LEFT FIBULA,  POSSIBLE REPAIR OF SYNDESMOSIS    Family History  Problem Relation Age of Onset  . Other Brother    History  Substance Use Topics  . Smoking status: Current Every Day Smoker -- 1.00 packs/day for 40 years    Types: Cigarettes  . Smokeless tobacco: Not on file  . Alcohol Use: 3.6 oz/week    6 Shots of liquor per week     Comment: 1 pint /day. 2 weeks ago decreased to 1/2 pint per day    Review of Systems  Musculoskeletal: Positive for arthralgias.  All other systems reviewed and  are negative.    Allergies  Review of patient's allergies indicates no known allergies.  Home Medications   Current Outpatient Rx  Name  Route  Sig  Dispense  Refill  . diazepam (VALIUM) 5 MG tablet   Oral   Take 1 tablet (5 mg total) by mouth every 8 (eight) hours as needed for muscle spasms (spasms).   10 tablet   0   . oxyCODONE-acetaminophen (PERCOCET/ROXICET) 5-325 MG per tablet   Oral   Take 1 tablet by mouth every 6 (six) hours as needed for severe pain.   30 tablet   0    BP 151/96  Pulse 70  Temp(Src) 98.2 F (36.8 C) (Oral)  Resp 18  SpO2 95%  Physical Exam  Nursing note and vitals reviewed. Constitutional: He is oriented to person, place, and time. He appears well-developed and well-nourished.  HENT:  Head: Normocephalic and atraumatic.  Mouth/Throat: Oropharynx is clear and moist.  Eyes: Conjunctivae and EOM are normal. Pupils are equal, round, and reactive to light.  Neck: Normal range of motion.  Cardiovascular: Normal rate, regular rhythm and normal heart sounds.   Pulmonary/Chest: Effort normal and breath sounds normal. No respiratory distress. He has no wheezes. He has no rhonchi.  TTP of right posterior ribs without noted bruising, swelling, laceration, abrasion, or deformity; no crepitus; lungs CTAB; no respiratory distress  Abdominal: Soft. Bowel sounds are normal.  Musculoskeletal: Normal range  of motion.  Neurological: He is alert and oriented to person, place, and time.  Skin: Skin is warm and dry.  Psychiatric: He has a normal mood and affect.    ED Course  Procedures (including critical care time) Labs Review Labs Reviewed - No data to display Imaging Review Dg Chest 2 View  04/24/2013   CLINICAL DATA:  Rib injury. Fell 4 days ago was slight right rib pain.  EXAM: CHEST  2 VIEW  COMPARISON:  Chest radiograph 06/24/2012 and right rib series 11 11/2012  FINDINGS: The heart size and mediastinal contours are within normal limits. Both lungs  are clear. The visualized skeletal structures are unremarkable. No displaced rib fracture is identified on this chest radiograph. Atherosclerotic calcification of the thoracic aortic arch is noted.  IMPRESSION: 1. No acute cardiopulmonary disease.  2.  Atherosclerotic calcification of the thoracic aortic arch.   Electronically Signed   By: Britta Mccreedy M.D.   On: 04/24/2013 14:04   Dg Ribs Unilateral Right  04/24/2013   CLINICAL DATA:  The status post fall 4 days prior  EXAM: RIGHT RIBS - 2 VIEW  COMPARISON:  None.  FINDINGS: A posterior rib fracture with callus formation is appreciated involving the 7th rib. A 2nd fracture is identified along the posterior aspect of the 8th rib. It is not appear to be significant callus formation about the 8th rib fracture.  IMPRESSION: 1. Findings concerning for nondisplaced fracture along the posterior aspect of 8th rib without significant callus formation suggesting an acute/subacute findings 2. Callus formation about a posterior 7th rib fracture these findings are on the left. For   Electronically Signed   By: Salome Holmes M.D.   On: 04/24/2013 14:10    EKG Interpretation   None       MDM   1. Rib fracture, right, sequela    Pt has known 8th rib fracture identified via imaging yesterday, results above.  Pt without new injury at this time, will not repeat imaging.  VS stable, lungs CTAB, no tachycardia, dyspnea, or chest pain to suggest ACS, PE, dissection, or other cardiac event at this time.  Will give pain meds and reassess.  Pts ribs were wrapped with 2 large ace bandages-- states provided a lot of comfort.  Pain controlled with toradol and valium in the ED.  Instructed to continue taking pain medications as directed, may add motrin between narcotic doses.  FU with [facility] if problems occur.  Discussed plan with pt, he agreed.  Return precautions advised.  Garlon Hatchet, PA-C 04/25/13 1654

## 2013-04-25 NOTE — ED Notes (Signed)
Patient arrived via [facility] from home. Patient fell on Monday and fractured his ribs. Patient was seen here in the ER yesterday. According to EMS patient is here today because he stated "the  pain medication is not working and they gave me Dilaudid and that worked".

## 2013-04-29 NOTE — ED Provider Notes (Signed)
Medical screening examination/treatment/procedure(s) were performed by non-physician practitioner and as supervising physician I was immediately available for consultation/collaboration.  EKG Interpretation   None         Anira Senegal M Marico Buckle, DO 04/29/13 0013 

## 2013-07-12 ENCOUNTER — Emergency Department (HOSPITAL_BASED_OUTPATIENT_CLINIC_OR_DEPARTMENT_OTHER): Payer: BC Managed Care – PPO

## 2013-07-12 ENCOUNTER — Emergency Department (HOSPITAL_BASED_OUTPATIENT_CLINIC_OR_DEPARTMENT_OTHER)
Admission: EM | Admit: 2013-07-12 | Discharge: 2013-07-12 | Disposition: A | Discharge status: Home / Self Care | Payer: BC Managed Care – PPO | Attending: Emergency Medicine | Admitting: Emergency Medicine

## 2013-07-12 ENCOUNTER — Encounter (HOSPITAL_BASED_OUTPATIENT_CLINIC_OR_DEPARTMENT_OTHER): Payer: Self-pay | Admitting: Emergency Medicine

## 2013-07-12 DIAGNOSIS — R51 Headache: Secondary | ICD-10-CM | POA: Insufficient documentation

## 2013-07-12 DIAGNOSIS — R61 Generalized hyperhidrosis: Secondary | ICD-10-CM | POA: Insufficient documentation

## 2013-07-12 DIAGNOSIS — I10 Essential (primary) hypertension: Secondary | ICD-10-CM | POA: Insufficient documentation

## 2013-07-12 DIAGNOSIS — M549 Dorsalgia, unspecified: Secondary | ICD-10-CM | POA: Insufficient documentation

## 2013-07-12 DIAGNOSIS — Z7982 Long term (current) use of aspirin: Secondary | ICD-10-CM | POA: Insufficient documentation

## 2013-07-12 DIAGNOSIS — I4891 Unspecified atrial fibrillation: Secondary | ICD-10-CM

## 2013-07-12 DIAGNOSIS — Z79899 Other long term (current) drug therapy: Secondary | ICD-10-CM | POA: Insufficient documentation

## 2013-07-12 HISTORY — DX: Essential (primary) hypertension: I10

## 2013-07-12 LAB — CBC
HEMATOCRIT: 55.1 % — AB (ref 39.0–52.0)
HEMOGLOBIN: 19.8 g/dL — AB (ref 13.0–17.0)
MCH: 34.7 pg — AB (ref 26.0–34.0)
MCHC: 35.9 g/dL (ref 30.0–36.0)
MCV: 96.7 fL (ref 78.0–100.0)
Platelets: 200 10*3/uL (ref 150–400)
RBC: 5.7 MIL/uL (ref 4.22–5.81)
RDW: 13.3 % (ref 11.5–15.5)
WBC: 12.1 10*3/uL — ABNORMAL HIGH (ref 4.0–10.5)

## 2013-07-12 LAB — COMPREHENSIVE METABOLIC PANEL
ALK PHOS: 96 U/L (ref 39–117)
ALT: 25 U/L (ref 0–53)
AST: 27 U/L (ref 0–37)
Albumin: 4.3 g/dL (ref 3.5–5.2)
BUN: 15 mg/dL (ref 6–23)
CALCIUM: 10 mg/dL (ref 8.4–10.5)
CO2: 27 meq/L (ref 19–32)
Chloride: 97 mEq/L (ref 96–112)
Creatinine, Ser: 1.1 mg/dL (ref 0.50–1.35)
GFR calc Af Amer: 81 mL/min — ABNORMAL LOW (ref >90)
GFR, EST NON AFRICAN AMERICAN: 70 mL/min — AB (ref >90)
Glucose, Bld: 129 mg/dL — ABNORMAL HIGH (ref 70–99)
POTASSIUM: 4 meq/L (ref 3.7–5.3)
SODIUM: 140 meq/L (ref 137–147)
Total Bilirubin: 0.6 mg/dL (ref 0.3–1.2)
Total Protein: 7.8 g/dL (ref 6.0–8.3)

## 2013-07-12 LAB — PROTIME-INR
INR: 0.93 (ref 0.00–1.49)
Prothrombin Time: 12.3 seconds (ref 11.6–15.2)

## 2013-07-12 LAB — APTT: aPTT: 34 seconds (ref 24–37)

## 2013-07-12 LAB — TROPONIN I

## 2013-07-12 MED ORDER — ASPIRIN 81 MG PO CHEW: 81.0000 mg | CHEWABLE_TABLET | Freq: Every day | ORAL | Status: DC

## 2013-07-12 MED ORDER — DILTIAZEM HCL ER COATED BEADS 120 MG PO CP24: 120.0000 mg | ORAL_CAPSULE | Freq: Every day | ORAL | Status: DC

## 2013-07-12 MED ORDER — SODIUM CHLORIDE 0.9 % IV SOLN: INTRAVENOUS | Status: DC

## 2013-07-12 MED ORDER — DILTIAZEM HCL 25 MG/5ML IV SOLN
20.0000 mg | Freq: Once | INTRAVENOUS | Status: AC
  Administered 2013-07-12: 20 mg via INTRAVENOUS
  Filled 2013-07-12: qty 5

## 2013-07-12 MED ORDER — SODIUM CHLORIDE 0.9 % IV BOLUS (SEPSIS)
500.0000 mL | Freq: Once | INTRAVENOUS | Status: AC
  Administered 2013-07-12: 500 mL via INTRAVENOUS

## 2013-07-12 MED ORDER — LORAZEPAM 2 MG/ML IJ SOLN
1.0000 mg | Freq: Once | INTRAMUSCULAR | Status: AC
  Administered 2013-07-12: 1 mg via INTRAVENOUS
  Filled 2013-07-12: qty 1

## 2013-07-12 NOTE — Discharge Instructions (Signed)
Atrial Fibrillation Atrial fibrillation is a condition that causes your heart to beat irregularly. It may also cause your heart to beat faster than normal. Atrial fibrillation can prevent your heart from pumping blood normally. It increases your risk of stroke and heart problems. HOME CARE  Take medications as told by your doctor.  Only take medications that your doctor says are safe. Some medications can make the condition worse or happen again.  If blood thinners were prescribed by your doctor, take them exactly as told. Too much can cause bleeding. Too little and you will not have the needed protection against stroke and other problems.  Perform blood tests at home if told by your doctor.  Perform blood tests exactly as told by your doctor.  Do not drink alcohol.  Do not drink beverages with caffeine such as coffee, soda, and some teas.  Maintain a healthy weight.  Do not use diet pills unless your doctor says they are safe. They may make heart problems worse.  Follow diet instructions as told by your doctor.  Exercise regularly as told by your doctor.  Keep all follow-up appointments. GET HELP RIGHT AWAY IF:   You have chest or belly (abdominal) pain.  You feel sick to your stomach (nauseous)  You suddenly have swollen feet and ankles.  You feel dizzy.  You face, arms, or legs feel numb or weak.  There is a change in your vision or speech.  You notice a change in the speed, rhythm, or strength of your heartbeat.  You suddenly begin peeing (urinating) more often.  You get tired more easily when moving or exercising. MAKE SURE YOU:   Understand these instructions.  Will watch your condition.  Will get help right away if you are not doing well or get worse. Document Released: 03/14/2008 Document Revised: 09/30/2012 Document Reviewed: 07/16/2012 [facility] Patient Information 2014 [place].  Start taking a baby aspirin a day. Start taking Procardia is am  as directed once a day. Call cardiology for appointment. Number is [phone]. Return for any new or worse symptoms her heart rate going fast for 40 minutes or longer. Return for any development of chest pain.

## 2013-07-12 NOTE — ED Provider Notes (Signed)
CSN: [id number]     Arrival date & time 07/12/13  1521 History  This chart was scribed for Mervin Kung, MD by Eugenia Mcalpine, ED Scribe. This patient was seen in room MH04/MH04 and the patient's care was started at 4:08 PM.      Chief Complaint  Patient presents with  . Chest Pain   (Consider location/radiation/quality/duration/timing/severity/associated sxs/prior Treatment) Patient is a 63 y.o. male presenting with chest pain. The history is provided by the patient. No language interpreter was used.  Chest Pain Associated symptoms: back pain, diaphoresis, dizziness, headache and palpitations   Associated symptoms: no abdominal pain, no cough, no fever, no nausea, no shortness of breath and not vomiting    HPI Comments: Travis Conrad is a 63 y.o. male who presents to the Emergency Department complaining of sudden onset gradually worsening constant palpitations onset this morning around 8:00 am with associated sweating in the area and light headedness.  He has had previous occurences throughout this week. Pt states that the palpitations are lasting longer than usual; typically they last for half a day.  But, the symptoms are worse today.  He denies dizziness, CP, nausea , vomiting, SOB.  Pcp Not In System   Past Medical History  Diagnosis Date  . Hypertension    Past Surgical History  Procedure Laterality Date  . Colonoscopy  >10 years  . Orif fibula fracture  06/24/2012    Procedure: OPEN REDUCTION INTERNAL FIXATION (ORIF) FIBULA FRACTURE;  Surgeon: [name], MD;  Location: [place];  Service: Orthopedics;  Laterality: Left;  ORIF LEFT FIBULA,  POSSIBLE REPAIR OF SYNDESMOSIS   . Ankle surgery Left    Family History  Problem Relation Age of Onset  . Other Brother    History  Substance Use Topics  . Smoking status: Current Every Day Smoker -- 0.50 packs/day for 40 years    Types: Cigarettes  . Smokeless tobacco: Not on file  . Alcohol Use: 3.6 oz/week    6 Shots of  liquor per week     Comment: 1 pint /day. 2 weeks ago decreased to 1/2 pint per day    Review of Systems  Constitutional: Positive for diaphoresis. Negative for fever and chills.  HENT: Negative for rhinorrhea and sore throat.   Eyes: Negative for visual disturbance.  Respiratory: Negative for cough and shortness of breath.   Cardiovascular: Positive for palpitations. Negative for chest pain and leg swelling.  Gastrointestinal: Negative for nausea, vomiting, abdominal pain and diarrhea.  Musculoskeletal: Positive for back pain.  Skin: Negative for rash.  Neurological: Positive for dizziness, light-headedness and headaches. Negative for syncope.       No veritgo   Hematological: Does not bruise/bleed easily.  Psychiatric/Behavioral: Negative for confusion.  All other systems reviewed and are negative.    Allergies  Review of patient's allergies indicates no known allergies.  Home Medications   Current Outpatient Rx  Name  Route  Sig  Dispense  Refill  . losartan-hydrochlorothiazide (HYZAAR) 100-25 MG per tablet   Oral   Take 1 tablet by mouth daily.         [name] aspirin 81 MG chewable tablet   Oral   Chew 1 tablet (81 mg total) by mouth daily.   30 tablet   2   . diltiazem (CARDIZEM CD) 120 MG 24 hr capsule   Oral   Take 1 capsule (120 mg total) by mouth daily.   14 capsule   0  BP 120/90  Pulse 113  Temp(Src) 97.5 F (36.4 C) (Oral)  Resp 31  Wt 196 lb (88.905 kg)  SpO2 97% Physical Exam  Nursing note and vitals reviewed. Constitutional: He is oriented to person, place, and time. He appears well-developed and well-nourished. No distress.  HENT:  Head: Normocephalic and atraumatic.  Eyes: EOM are normal.  Neck: Neck supple. No tracheal deviation present.  Cardiovascular: An irregular rhythm present. Tachycardia present.   No murmur heard. Pulmonary/Chest: Effort normal and breath sounds normal. No respiratory distress.  Abdominal: Soft.   Musculoskeletal: Normal range of motion. He exhibits no edema.  Neurological: He is alert and oriented to person, place, and time. No cranial nerve deficit. He exhibits normal muscle tone. Coordination normal.  Skin: Skin is warm and dry.  Psychiatric: He has a normal mood and affect. His behavior is normal.    ED Course  Procedures (including critical care time)  DIAGNOSTIC STUDIES: Oxygen Saturation is 99% on RA, normal by my interpretation.    COORDINATION OF CARE: 4:17 PM- Pt advised of plan for treatment including chest X-ray, heart rate medication and advising the pt to follow up with a cardiologist and possible admittance if we cannot get the heart rate under control. Pt also warned of the possibility of clots and medication if he continues to be in A-fib and pt agrees.    Labs Review Labs Reviewed  COMPREHENSIVE METABOLIC PANEL - Abnormal; Notable for the following:    Glucose, Bld 129 (*)    GFR calc non Af Amer 70 (*)    GFR calc Af Amer 81 (*)    All other components within normal limits  CBC - Abnormal; Notable for the following:    WBC 12.1 (*)    Hemoglobin 19.8 (*)    HCT 55.1 (*)    MCH 34.7 (*)    All other components within normal limits  APTT  PROTIME-INR  TROPONIN I   Results for orders placed during the hospital encounter of 07/12/13  APTT      Result Value Range   aPTT 34  24 - 37 seconds  PROTIME-INR      Result Value Range   Prothrombin Time 12.3  11.6 - 15.2 seconds   INR 0.93  0.00 - 1.49  COMPREHENSIVE METABOLIC PANEL      Result Value Range   Sodium 140  137 - 147 mEq/L   Potassium 4.0  3.7 - 5.3 mEq/L   Chloride 97  96 - 112 mEq/L   CO2 27  19 - 32 mEq/L   Glucose, Bld 129 (*) 70 - 99 mg/dL   BUN 15  6 - 23 mg/dL   Creatinine, Ser 1.10  0.50 - 1.35 mg/dL   Calcium 10.0  8.4 - 10.5 mg/dL   Total Protein 7.8  6.0 - 8.3 g/dL   Albumin 4.3  3.5 - 5.2 g/dL   AST 27  0 - 37 U/L   ALT 25  0 - 53 U/L   Alkaline Phosphatase 96  39 - 117  U/L   Total Bilirubin 0.6  0.3 - 1.2 mg/dL   GFR calc non Af Amer 70 (*) >90 mL/min   GFR calc Af Amer 81 (*) >90 mL/min  CBC      Result Value Range   WBC 12.1 (*) 4.0 - 10.5 K/uL   RBC 5.70  4.22 - 5.81 MIL/uL   Hemoglobin 19.8 (*) 13.0 - 17.0 g/dL   HCT 55.1 (*)  39.0 - 52.0 %   MCV 96.7  78.0 - 100.0 fL   MCH 34.7 (*) 26.0 - 34.0 pg   MCHC 35.9  30.0 - 36.0 g/dL   RDW 13.3  11.5 - 15.5 %   Platelets 200  150 - 400 K/uL  TROPONIN I      Result Value Range   Troponin I <0.30  <0.30 ng/mL    Imaging Review Dg Chest Port 1 View  07/12/2013   CLINICAL DATA:  Chest pain.  EXAM: PORTABLE CHEST - 1 VIEW  COMPARISON:  04/24/2013  FINDINGS: Lungs are clear. The cardiomediastinal silhouette and remainder of the exam is unchanged.  IMPRESSION: No active disease.   Electronically Signed   By: Marin Olp M.D.   On: 07/12/2013 16:43    EKG Interpretation    Date/Time:  Saturday July 12 2013 17:18:12 EST Ventricular Rate:  113 PR Interval:  220 QRS Duration: 86 QT Interval:  298 QTC Calculation: 408 R Axis:   55 Text Interpretation:  Sinus tachycardia with 1st degree A-V block Cannot rule out Anterior infarct , age undetermined Abnormal ECG Atrial fibrillation RESOLVED SINCE PREVIOUS Confirmed by Jaysa Kise  MD, Jeneane Pieczynski ([id number]) on 07/12/2013 5:29:41 PM            MDM   1. Atrial fibrillation with rapid ventricular response     Last EKG is the only one that is listed above. That's after the patient had received 20 mg of Cardizem and converted back to a sinus rhythm. Some mild tachycardia. No significant shortness of breath no chest pain of any significance. Patient has had this happen in the past on and off usually lasting for about 6 hours and then resolving. Patient has not any followup. Initial EKG was consistent with rapid rate atrial fibrillation. Patient will be continued on Cardizem by mouth 120 mg once a day and will followup with cardiology appointment number provided. In  addition patient will start a baby aspirin a day. Patient will return for any recurrent symptoms or new or worse symptoms.  At discharge patient feels much better no acute distress nontoxic. The symptoms that he had with the rapid heart rate of feeling lightheaded and dizzy have all resolved.  Patient's chest x-rays negative for any significant findings no pneumonia pneumothorax or pulmonary edema. Troponin was negative. Labs without any significant abnormalities.   I personally performed the services described in this documentation, which was scribed in my presence. The recorded information has been reviewed and is accurate.     Mervin Kung, MD 07/12/13 [phone]

## 2013-07-12 NOTE — ED Notes (Addendum)
Patient here with recurrent CP. Reports that this episode today is the 3rd event this week with dizziness and diaphoresis. Stopped his BP meds but restarted same 3 days ago. denies radiation with the pain. States that the discomfort is hard to describe, nagging discomfort

## 2013-07-25 ENCOUNTER — Ambulatory Visit (INDEPENDENT_AMBULATORY_CARE_PROVIDER_SITE_OTHER): Payer: BC Managed Care – PPO | Admitting: Internal Medicine

## 2013-07-25 ENCOUNTER — Encounter: Payer: Self-pay | Admitting: Internal Medicine

## 2013-07-25 VITALS — BP 153/88 | HR 77 | Ht 73.0 in | Wt 196.0 lb

## 2013-07-25 DIAGNOSIS — I10 Essential (primary) hypertension: Secondary | ICD-10-CM

## 2013-07-25 DIAGNOSIS — Z720 Tobacco use: Secondary | ICD-10-CM

## 2013-07-25 DIAGNOSIS — I4891 Unspecified atrial fibrillation: Secondary | ICD-10-CM

## 2013-07-25 DIAGNOSIS — F17200 Nicotine dependence, unspecified, uncomplicated: Secondary | ICD-10-CM

## 2013-07-25 DIAGNOSIS — F1010 Alcohol abuse, uncomplicated: Secondary | ICD-10-CM

## 2013-07-25 LAB — BASIC METABOLIC PANEL WITH GFR
BUN: 27 mg/dL — ABNORMAL HIGH (ref 6–23)
CALCIUM: 9.6 mg/dL (ref 8.4–10.5)
CO2: 29 meq/L (ref 19–32)
CREATININE: 1.16 mg/dL (ref 0.50–1.35)
Chloride: 100 mEq/L (ref 96–112)
GFR, Est African American: 77 mL/min
GFR, Est Non African American: 67 mL/min
GLUCOSE: 88 mg/dL (ref 70–99)
Potassium: 4.1 mEq/L (ref 3.5–5.3)
SODIUM: 139 meq/L (ref 135–145)

## 2013-07-25 LAB — HEPATIC FUNCTION PANEL
ALK PHOS: 76 U/L (ref 39–117)
ALT: 22 U/L (ref 0–53)
AST: 23 U/L (ref 0–37)
Albumin: 4.3 g/dL (ref 3.5–5.2)
BILIRUBIN DIRECT: 0.2 mg/dL (ref 0.0–0.3)
BILIRUBIN INDIRECT: 0.5 mg/dL (ref 0.2–1.2)
BILIRUBIN TOTAL: 0.7 mg/dL (ref 0.2–1.2)
Total Protein: 7.1 g/dL (ref 6.0–8.3)

## 2013-07-25 LAB — TSH: TSH: 2.129 u[IU]/mL (ref 0.350–4.500)

## 2013-07-25 LAB — CBC
HEMATOCRIT: 48.1 % (ref 39.0–52.0)
HEMOGLOBIN: 17.4 g/dL — AB (ref 13.0–17.0)
MCH: 35.2 pg — ABNORMAL HIGH (ref 26.0–34.0)
MCHC: 36.2 g/dL — ABNORMAL HIGH (ref 30.0–36.0)
MCV: 97.2 fL (ref 78.0–100.0)
Platelets: 226 10*3/uL (ref 150–400)
RBC: 4.95 MIL/uL (ref 4.22–5.81)
RDW: 14.2 % (ref 11.5–15.5)
WBC: 10.4 10*3/uL (ref 4.0–10.5)

## 2013-07-25 MED ORDER — DILTIAZEM HCL ER COATED BEADS 180 MG PO CP24: 180.0000 mg | ORAL_CAPSULE | Freq: Every day | ORAL | Status: DC

## 2013-07-25 MED ORDER — DILTIAZEM HCL ER COATED BEADS 180 MG PO CP24
180.0000 mg | ORAL_CAPSULE | Freq: Every day | ORAL | Status: DC
Start: 2013-07-25 — End: 2013-07-25

## 2013-07-25 MED ORDER — ASPIRIN EC 325 MG PO TBEC: 325.0000 mg | DELAYED_RELEASE_TABLET | Freq: Every day | ORAL | Status: DC

## 2013-07-25 NOTE — Patient Instructions (Signed)
Your physician recommends that you schedule a follow-up appointment in: 4 WEEKS WITH DR ROSS  INCREASE DILTIAZEM TO 180 MG ONCE DAILY  INCREASE ASPIRIN TO 325 MG ONCE DAILY  Your physician has requested that you have an echocardiogram. Echocardiography is a painless test that uses sound waves to create images of your heart. It provides your doctor with information about the size and shape of your heart and how well your heart's chambers and valves are working. This procedure takes approximately one hour. There are no restrictions for this procedure.   Your physician recommends that you HAVE LAB WORK TODAY

## 2013-07-25 NOTE — Progress Notes (Addendum)
HPI Patient is a 63 yo who was referred freom the ER for evaluation of afib The patient was seen at [facility] ER in January.  He woke up one morning with his heart racing.  Associated with diaphoresis and dizziness.  No CP   Patient went to ER  Found to be in Afib  Given diltizaem with conversion to SR The patient says prior to ER visit he had had intermitt palpitations but nothing as severe or prolonged.  He has not had any severe spell since Claims he drinks a pint of alcohol per night.    No Known Allergies  Current Outpatient Prescriptions  Medication Sig Dispense Refill  . aspirin 81 MG chewable tablet Chew 1 tablet (81 mg total) by mouth daily.  30 tablet  2  . diltiazem (CARDIZEM CD) 120 MG 24 hr capsule Take 1 capsule (120 mg total) by mouth daily.  14 capsule  0  . losartan-hydrochlorothiazide (HYZAAR) 100-25 MG per tablet Take 1 tablet by mouth daily.       No current facility-administered medications for this visit.    Past Medical History  Diagnosis Date  . Hypertension     Past Surgical History  Procedure Laterality Date  . Colonoscopy  >10 years  . Orif fibula fracture  06/24/2012    Procedure: OPEN REDUCTION INTERNAL FIXATION (ORIF) FIBULA FRACTURE;  Surgeon: [name], MD;  Location: [place];  Service: Orthopedics;  Laterality: Left;  ORIF LEFT FIBULA,  POSSIBLE REPAIR OF SYNDESMOSIS   . Ankle surgery Left     Family History  Problem Relation Age of Onset  . Other Brother     History   Social History  . Marital Status: Married    Spouse Name: N/A    Number of Children: N/A  . Years of Education: N/A   Occupational History  . Not on file.   Social History Main Topics  . Smoking status: Current Every Day Smoker -- 0.50 packs/day for 40 years    Types: Cigarettes  . Smokeless tobacco: Not on file  . Alcohol Use: 3.6 oz/week    6 Shots of liquor per week     Comment: 1 pint /day. 2 weeks ago decreased to 1/2 pint per day  . Drug Use: Yes    Special:  Cocaine, Marijuana     Comment: 15 years ago  . Sexual Activity: Yes   Other Topics Concern  . Not on file   Social History Narrative  . No narrative on file    Review of Systems:  All systems reviewed.  They are negative to the above problem except as previously stated.  Vital Signs: BP 153/88  Pulse 77  Ht 6\' 1"  (1.854 m)  Wt 196 lb (88.905 kg)  BMI 25.86 kg/m2  Physical Exam Patient is in NAD HEENT:  Normocephalic, atraumatic. EOMI, PERRLA.  Neck: JVP is normal.  No bruits.  Lungs: clear to auscultation. No rales no wheezes.  Heart: Regular rate and rhythm. Normal S1, S2. No S3.   No significant murmurs. PMI not displaced.  Abdomen:  Supple, nontender. Normal bowel sounds. No masses. No hepatomegaly.  Extremities:   Good distal pulses throughout. No lower extremity edema.  Musculoskeletal :moving all extremities.  Neuro:   alert and oriented x3.  CN II-XII grossly intact.  EKG  SR 77.  Occasional PVC   Assessment and Plan:  1.  Atrial fib  Patinet currently in SR CHADSVasc is 1.  I would recomm ECASA  325 mg Would set up for echo.  Keep on dilitazem  INcrease to 180 mg given BP Check TSH  2.  HTN  As above.    3.  EtOH abuse  Counselled  Needs to quit esp with afib.Explained risks of multiorgan damage 4.  Tob  Counselled on cessation.    5.  Heme  Will set up for CBC  Patient's Hgb/Hct were elevated when seen in ER.

## 2013-08-12 ENCOUNTER — Other Ambulatory Visit (HOSPITAL_COMMUNITY): Payer: BC Managed Care – PPO

## 2013-08-18 ENCOUNTER — Ambulatory Visit: Payer: BC Managed Care – PPO | Admitting: Internal Medicine

## 2013-09-03 ENCOUNTER — Ambulatory Visit (HOSPITAL_COMMUNITY): Payer: BC Managed Care – PPO | Attending: Internal Medicine | Admitting: Cardiology

## 2013-09-03 DIAGNOSIS — I4891 Unspecified atrial fibrillation: Secondary | ICD-10-CM

## 2013-09-03 DIAGNOSIS — I10 Essential (primary) hypertension: Secondary | ICD-10-CM | POA: Insufficient documentation

## 2013-09-03 DIAGNOSIS — F17200 Nicotine dependence, unspecified, uncomplicated: Secondary | ICD-10-CM | POA: Insufficient documentation

## 2013-09-03 NOTE — Progress Notes (Signed)
Echo performed. 

## 2013-09-08 ENCOUNTER — Ambulatory Visit (INDEPENDENT_AMBULATORY_CARE_PROVIDER_SITE_OTHER): Payer: BC Managed Care – PPO | Admitting: Internal Medicine

## 2013-09-08 ENCOUNTER — Encounter: Payer: Self-pay | Admitting: Internal Medicine

## 2013-09-08 VITALS — BP 149/72 | HR 79 | Ht 72.0 in | Wt 195.0 lb

## 2013-09-08 DIAGNOSIS — F1010 Alcohol abuse, uncomplicated: Secondary | ICD-10-CM

## 2013-09-08 DIAGNOSIS — I10 Essential (primary) hypertension: Secondary | ICD-10-CM

## 2013-09-08 DIAGNOSIS — I4891 Unspecified atrial fibrillation: Secondary | ICD-10-CM

## 2013-09-08 MED ORDER — DILTIAZEM HCL ER COATED BEADS 240 MG PO CP24: 240.0000 mg | ORAL_CAPSULE | Freq: Every day | ORAL | Status: DC

## 2013-09-08 NOTE — Progress Notes (Signed)
HPI Patient is a 63 yo who I saw for the first time in Feb 2015  Referred then forafib  See note for full details. When I saw him I recomm echo, ASA, dilt 180  Echo was done on 3/18 showing normal LV function.  No valvular abnormalities  LA was normal in size Since seen he has done OK  No palpitations  No dizziness  Breathing is OK Not taking aspirin as i prescribed     No Known Allergies  Current Outpatient Prescriptions  Medication Sig Dispense Refill  . aspirin EC 325 MG tablet Take 1 tablet (325 mg total) by mouth daily.  30 tablet  0  . diltiazem (CARDIZEM CD) 180 MG 24 hr capsule Take 1 capsule (180 mg total) by mouth daily.  30 capsule  12  . losartan-hydrochlorothiazide (HYZAAR) 100-25 MG per tablet Take 1 tablet by mouth daily.       No current facility-administered medications for this visit.    Past Medical History  Diagnosis Date  . Hypertension     Past Surgical History  Procedure Laterality Date  . Colonoscopy  >10 years  . Orif fibula fracture  06/24/2012    Procedure: OPEN REDUCTION INTERNAL FIXATION (ORIF) FIBULA FRACTURE;  Surgeon: [name], MD;  Location: [place];  Service: Orthopedics;  Laterality: Left;  ORIF LEFT FIBULA,  POSSIBLE REPAIR OF SYNDESMOSIS   . Ankle surgery Left     Family History  Problem Relation Age of Onset  . Other Brother     History   Social History  . Marital Status: Married    Spouse Name: N/A    Number of Children: N/A  . Years of Education: N/A   Occupational History  . Not on file.   Social History Main Topics  . Smoking status: Current Every Day Smoker -- 0.50 packs/day for 40 years    Types: Cigarettes  . Smokeless tobacco: Not on file  . Alcohol Use: 3.6 oz/week    6 Shots of liquor per week     Comment: 1 pint /day. 2 weeks ago decreased to 1/2 pint per day  . Drug Use: Yes    Special: Cocaine, Marijuana     Comment: 15 years ago  . Sexual Activity: Yes   Other Topics Concern  . Not on file   Social  History Narrative  . No narrative on file    Review of Systems:  All systems reviewed.  They are negative to the above problem except as previously stated.  Vital Signs: BP 149/72  Pulse 79  Ht 6' (1.829 m)  Wt 195 lb (88.451 kg)  BMI 26.44 kg/m2  Physical Exam Patient is in NAD HEENT:  Normocephalic, atraumatic. EOMI, PERRLA.  Neck: JVP is normal.  No bruits.  Lungs: clear to auscultation. No rales no wheezes.  Heart: Regular rate and rhythm. Normal S1, S2. No S3.   No significant murmurs. PMI not displaced.  Abdomen:  Supple, nontender. Normal bowel sounds. No masses. No hepatomegaly.  Extremities:   Good distal pulses throughout. No lower extremity edema.  Musculoskeletal :moving all extremities.  Neuro:   alert and oriented x3.  CN II-XII grossly intact.  EKG  SR 77.  Occasional PVC   Assessment and Plan:  1.  Atrial fib  Patinet currently in SR CHADSVasc is 1.  WIll increase Dilt to 240.  He is not taking ASA  With ETOH use (1 pint per night) it prob is a risk for  Bleeding  Discussed stroke risk.  He understands.    2.  HTN  As above.    3.  EtOH abuse  Counselled  Needs to quit esp with afib.Explained risks of multiorgan damage 4.  Tob  Counselled on cessation.    F/U in September  5.  Heme  Will set up for CBC  Patient's Hgb/Hct were elevated when seen in ER.

## 2013-09-08 NOTE — Patient Instructions (Signed)
Your physician wants you to follow-up in: [place] will receive a reminder letter in the mail two months in advance. If you don't receive a letter, please call our office to schedule the follow-up appointment.   STOP DILTIAZEM 180 MG WHEN FINISHED WITH CURRENT SUPPLY  START DILTIAZEM 240 MG WHEN OTHER IS COMPLETE.

## 2013-09-10 ENCOUNTER — Telehealth: Payer: Self-pay | Admitting: *Deleted

## 2013-09-10 NOTE — Telephone Encounter (Signed)
Message from dr [name]     Patient with history of Afib    Echo shows normal LV/RV function.     No change    Left message of results for pt

## 2013-12-25 ENCOUNTER — Other Ambulatory Visit: Payer: Self-pay

## 2013-12-25 MED ORDER — LOSARTAN POTASSIUM-HCTZ 100-25 MG PO TABS: 1.0000 | ORAL_TABLET | Freq: Every day | ORAL | Status: DC

## 2013-12-29 ENCOUNTER — Emergency Department (HOSPITAL_BASED_OUTPATIENT_CLINIC_OR_DEPARTMENT_OTHER): Payer: BC Managed Care – PPO

## 2013-12-29 ENCOUNTER — Encounter (HOSPITAL_BASED_OUTPATIENT_CLINIC_OR_DEPARTMENT_OTHER): Payer: Self-pay | Admitting: Emergency Medicine

## 2013-12-29 ENCOUNTER — Emergency Department (HOSPITAL_BASED_OUTPATIENT_CLINIC_OR_DEPARTMENT_OTHER)
Admission: EM | Admit: 2013-12-29 | Discharge: 2013-12-29 | Disposition: A | Discharge status: Home / Self Care | Payer: BC Managed Care – PPO | Attending: Emergency Medicine | Admitting: Emergency Medicine

## 2013-12-29 DIAGNOSIS — I4891 Unspecified atrial fibrillation: Secondary | ICD-10-CM | POA: Diagnosis not present

## 2013-12-29 DIAGNOSIS — I10 Essential (primary) hypertension: Secondary | ICD-10-CM | POA: Insufficient documentation

## 2013-12-29 DIAGNOSIS — Z79899 Other long term (current) drug therapy: Secondary | ICD-10-CM | POA: Diagnosis not present

## 2013-12-29 DIAGNOSIS — M545 Low back pain, unspecified: Secondary | ICD-10-CM

## 2013-12-29 DIAGNOSIS — F17200 Nicotine dependence, unspecified, uncomplicated: Secondary | ICD-10-CM | POA: Diagnosis not present

## 2013-12-29 HISTORY — DX: Unspecified atrial fibrillation: I48.91

## 2013-12-29 LAB — URINALYSIS, ROUTINE W REFLEX MICROSCOPIC
Bilirubin Urine: NEGATIVE
Glucose, UA: NEGATIVE mg/dL
HGB URINE DIPSTICK: NEGATIVE
Ketones, ur: NEGATIVE mg/dL
Leukocytes, UA: NEGATIVE
Nitrite: NEGATIVE
PROTEIN: NEGATIVE mg/dL
Specific Gravity, Urine: 1.023 (ref 1.005–1.030)
Urobilinogen, UA: 1 mg/dL (ref 0.0–1.0)
pH: 6.5 (ref 5.0–8.0)

## 2013-12-29 MED ORDER — CYCLOBENZAPRINE HCL 5 MG PO TABS: 5.0000 mg | ORAL_TABLET | Freq: Two times a day (BID) | ORAL | Status: DC | PRN

## 2013-12-29 MED ORDER — CYCLOBENZAPRINE HCL 10 MG PO TABS
10.0000 mg | ORAL_TABLET | Freq: Once | ORAL | Status: AC
  Administered 2013-12-29: 10 mg via ORAL
  Filled 2013-12-29: qty 1

## 2013-12-29 MED ORDER — KETOROLAC TROMETHAMINE 30 MG/ML IJ SOLN
60.0000 mg | Freq: Once | INTRAMUSCULAR | Status: AC
  Administered 2013-12-29: 60 mg via INTRAMUSCULAR
  Filled 2013-12-29: qty 2

## 2013-12-29 MED ORDER — HYDROCODONE-ACETAMINOPHEN 5-325 MG PO TABS: 1.0000 | ORAL_TABLET | Freq: Four times a day (QID) | ORAL | Status: DC | PRN

## 2013-12-29 NOTE — ED Notes (Signed)
Pt reports mid back pain since Saturday.  Denies injury.

## 2013-12-29 NOTE — ED Provider Notes (Signed)
CSN: [id number]     Arrival date & time 12/29/13  1652 History   First MD Initiated Contact with Patient 12/29/13 1707     Chief Complaint  Patient presents with  . Back Pain     (Consider location/radiation/quality/duration/timing/severity/associated sxs/prior Treatment) Patient is a 63 y.o. male presenting with back pain. The history is provided by the patient. No language interpreter was used.  Back Pain Location:  Lumbar spine Quality:  Aching Radiates to:  Does not radiate Pain severity:  Moderate Pain is:  Same all the time Onset quality:  Sudden Timing:  Constant Progression:  Unchanged Chronicity:  New Context: not lifting heavy objects   Relieved by:  Nothing Worsened by:  Movement Ineffective treatments:  Narcotics and NSAIDs Associated symptoms: no abdominal pain, no fever, no numbness and no tingling     Past Medical History  Diagnosis Date  . Hypertension   . Atrial fibrillation    Past Surgical History  Procedure Laterality Date  . Colonoscopy  >10 years  . Orif fibula fracture  06/24/2012    Procedure: OPEN REDUCTION INTERNAL FIXATION (ORIF) FIBULA FRACTURE;  Surgeon: [name], MD;  Location: [place];  Service: Orthopedics;  Laterality: Left;  ORIF LEFT FIBULA,  POSSIBLE REPAIR OF SYNDESMOSIS   . Ankle surgery Left    Family History  Problem Relation Age of Onset  . Other Brother    History  Substance Use Topics  . Smoking status: Current Every Day Smoker -- 1.00 packs/day for 40 years    Types: Cigarettes  . Smokeless tobacco: Not on file  . Alcohol Use: 3.6 oz/week    6 Shots of liquor per week     Comment: 1 pint /day    Review of Systems  Constitutional: Negative for fever.  Respiratory: Negative.   Cardiovascular: Negative.   Gastrointestinal: Negative for abdominal pain.  Musculoskeletal: Positive for back pain.  Neurological: Negative for tingling and numbness.      Allergies  Review of patient's allergies indicates no known  allergies.  Home Medications   Prior to Admission medications   Medication Sig Start Date End Date Taking? Authorizing Provider  diltiazem (CARDIZEM CD) 240 MG 24 hr capsule Take 1 capsule (240 mg total) by mouth daily. 09/08/13   Fay Records, MD  losartan-hydrochlorothiazide (HYZAAR) 100-25 MG per tablet Take 1 tablet by mouth daily. 12/25/13   Fay Records, MD   BP 144/102  Pulse 77  Temp(Src) 98.3 F (36.8 C) (Oral)  Resp 18  SpO2 97% Physical Exam  Nursing note and vitals reviewed. Constitutional: He is oriented to person, place, and time. He appears well-developed and well-nourished.  Cardiovascular: Normal rate and regular rhythm.   Pulmonary/Chest: Effort normal and breath sounds normal.  Musculoskeletal: Normal range of motion.       Cervical back: Normal.       Thoracic back: Normal.       Lumbar back: He exhibits bony tenderness.  Pt has full rom of all extremities. Pt ambulatory without any problem.   Neurological: He is alert and oriented to person, place, and time. He exhibits normal muscle tone. Coordination normal.  Skin: Skin is warm and dry.  Psychiatric: He has a normal mood and affect.    ED Course  Procedures (including critical care time) Labs Review Labs Reviewed  URINALYSIS, ROUTINE W REFLEX MICROSCOPIC    Imaging Review Dg Lumbar Spine Complete  12/29/2013   CLINICAL DATA:  Back pain  EXAM: LUMBAR  SPINE - COMPLETE 4+ VIEW  COMPARISON:  None.  FINDINGS: Normal alignment lumbar vertebral bodies. No loss of vertebral body height or disc height. No subluxation. There is mild endplate spurring. No pars fracture.  IMPRESSION: No acute findings lumbar spine.  Mild endplate degeneration.   Electronically Signed   By: Suzy Bouchard M.D.   On: 12/29/2013 18:23     EKG Interpretation None      MDM   Final diagnoses:  Midline low back pain without sciatica   Pt is neurologically intact:no red flags. Pt feeling a little better after toradol and flexeril  here. No infection noted in the urine. Pt is okay to follow up with DR. hudnall for continued symptoms. Will send home on hydrocodone and flexeril   Glendell Docker, NP 12/29/13 1853

## 2013-12-29 NOTE — Discharge Instructions (Signed)

## 2013-12-29 NOTE — ED Provider Notes (Signed)
Medical screening examination/treatment/procedure(s) were performed by non-physician practitioner and as supervising physician I was immediately available for consultation/collaboration.  Neta Ehlers, MD 12/29/13 [phone]

## 2014-01-13 ENCOUNTER — Encounter: Payer: Self-pay | Admitting: Internal Medicine

## 2014-04-09 NOTE — Progress Notes (Signed)
HPI Travis Conrad is a 63 yo who I saw for the first time in Feb 2015  Referred then forafib  See note for full details. When I saw him I recomm echo, ASA, dilt 180  Echo was done on 3/18 showing normal LV function.  No valvular abnormalities  LA was normal in size Since seen he has done OK  No palpitations  No dizziness  Breathing is OK Not taking aspirin as i prescribed  I saw the Travis Conrad back in March 2015  SInce seen he notes two spells of his heart racing  Most recent was last wk.  MIld dizziness  Last spell lasted 3 t o4 hours.  When not feeling feels OK    STill smoking  Has cough   No Known Allergies  Current Outpatient Prescriptions  Medication Sig Dispense Refill  . diltiazem (CARDIZEM CD) 240 MG 24 hr capsule Take 1 capsule (240 mg total) by mouth daily.  90 capsule  3  . losartan-hydrochlorothiazide (HYZAAR) 100-25 MG per tablet Take 1 tablet by mouth daily.  90 tablet  2   No current facility-administered medications for this visit.    Past Medical History  Diagnosis Date  . Hypertension   . Atrial fibrillation     Past Surgical History  Procedure Laterality Date  . Colonoscopy  >10 years  . Orif fibula fracture  06/24/2012    Procedure: OPEN REDUCTION INTERNAL FIXATION (ORIF) FIBULA FRACTURE;  Surgeon: [name], MD;  Location: [facility];  Service: Orthopedics;  Laterality: Left;  ORIF LEFT FIBULA,  POSSIBLE REPAIR OF SYNDESMOSIS   . Ankle surgery Left     Family History  Problem Relation Age of Onset  . Other Brother     History   Social History  . Marital Status: Married    Spouse Name: N/A    Number of Children: N/A  . Years of Education: N/A   Occupational History  . Not on file.   Social History Main Topics  . Smoking status: Current Every Day Smoker -- 1.00 packs/day for 40 years    Types: Cigarettes  . Smokeless tobacco: Not on file  . Alcohol Use: 3.6 oz/week    6 Shots of liquor per week     Comment: 1 pint /day  . Drug Use: No     Comment:  15 years ago  . Sexual Activity: Yes   Other Topics Concern  . Not on file   Social History Narrative  . No narrative on file    Review of Systems:  All systems reviewed.  They are negative to the above problem except as previously stated.  Vital Signs: BP 126/82  Pulse 75  Ht 6' (1.829 m)  Wt 189 lb (85.73 kg)  BMI 25.63 kg/m2  Physical Exam Travis Conrad is in NAD HEENT:  Normocephalic, atraumatic. EOMI, PERRLA.  Neck: JVP is normal.  No bruits.  Lungs: Rhonchi, wheezes  Heart: Regular rate and rhythm. Normal S1, S2. No S3.   No significant murmurs. PMI not displaced.  Abdomen:  Supple, nontender. Normal bowel sounds. No masses. No hepatomegaly.  Extremities:   Good distal pulses throughout. No lower extremity edema.  Musculoskeletal :moving all extremities.  Neuro:   alert and oriented x3.  CN II-XII grossly intact.  EKG  SR 75 bpm   Assessment and Plan:  1.  Atrial fib  Patinet currently in SR CHADSVasc is 1. Keep on current regimen  IF has more in the next couple months would  set up for monitor to document  Then consider antiarrhythmic    2.  HTN  BP is normal    3.  EtOH abuse   4.  Tobacco abuse  COunselled on cesssation.  I have given him Rx for steroid inhaler.

## 2014-04-10 ENCOUNTER — Encounter: Payer: Self-pay | Admitting: Internal Medicine

## 2014-04-10 ENCOUNTER — Ambulatory Visit (INDEPENDENT_AMBULATORY_CARE_PROVIDER_SITE_OTHER): Payer: BC Managed Care – PPO | Admitting: Internal Medicine

## 2014-04-10 VITALS — BP 126/82 | HR 75 | Ht 72.0 in | Wt 189.0 lb

## 2014-04-10 DIAGNOSIS — I4891 Unspecified atrial fibrillation: Secondary | ICD-10-CM

## 2014-04-10 MED ORDER — FLUTICASONE PROPIONATE HFA 220 MCG/ACT IN AERO: 1.0000 | INHALATION_SPRAY | Freq: Every day | RESPIRATORY_TRACT | Status: DC

## 2014-04-10 NOTE — Patient Instructions (Addendum)
Your physician wants you to follow-up in: [place] will receive a reminder letter in the mail two months in advance. If you don't receive a letter, please call our office to schedule the follow-up appointment.  Your physician recommends that you continue on your current medications as directed. Please refer to the Current Medication list given to you today.   GEN FLOVENT   1 PUFF  EVERY DAY

## 2014-10-03 ENCOUNTER — Other Ambulatory Visit: Payer: Self-pay | Admitting: Internal Medicine

## 2014-10-12 ENCOUNTER — Encounter: Payer: Self-pay | Admitting: Internal Medicine

## 2014-10-12 ENCOUNTER — Ambulatory Visit (INDEPENDENT_AMBULATORY_CARE_PROVIDER_SITE_OTHER): Payer: 59 | Admitting: Internal Medicine

## 2014-10-12 VITALS — BP 132/66 | HR 84 | Ht 72.0 in | Wt 183.6 lb

## 2014-10-12 DIAGNOSIS — I10 Essential (primary) hypertension: Secondary | ICD-10-CM

## 2014-10-12 DIAGNOSIS — I4891 Unspecified atrial fibrillation: Secondary | ICD-10-CM

## 2014-10-12 NOTE — Patient Instructions (Addendum)
Your physician recommends that you continue on your current medications as directed. Please refer to the Current Medication list given to you today. Your physician recommends that you return for lab work in: TOMORROW (TSH, LIPID, BMET, CBC)  Your physician wants you to follow-up in: December, 2016 WITH [name].  You will receive a reminder letter in the mail two months in advance. If you don't receive a letter, please call our office to schedule the follow-up appointment.

## 2014-10-12 NOTE — Progress Notes (Signed)
Cardiology Office Note   Date:  10/12/2014   ID:  Travis Conrad Mar 09, 1951, MRN [id number]  PCP:  Pcp Not In System  Cardiologist:   [name], MD   No chief complaint on file.     History of Present Illness: Travis Conrad is a 64 y.o. male with a history of PAF   Echo snowed normal LA, LV  I saw him last fall  He was having increased palpitaitions   After seeing him he realized he was not taking his diltiazme SInce restarting he has not had any more problems No CP  No dizziness  No SOB  No palpitaiotns.        Current Outpatient Prescriptions  Medication Sig Dispense Refill  . cyclobenzaprine (FLEXERIL) 10 MG tablet Take 1 tablet by mouth 3 (three) times daily.   2  . diltiazem (DILACOR XR) 240 MG 24 hr capsule TAKE ONE CAPSULE BY MOUTH EVERY DAY 90 capsule 3  . fluticasone (FLOVENT HFA) 220 MCG/ACT inhaler Inhale 1 puff into the lungs daily. 1 Inhaler 6  . losartan-hydrochlorothiazide (HYZAAR) 100-25 MG per tablet TAKE 1 TABLET BY MOUTH DAILY. 90 tablet 2  . meloxicam (MOBIC) 15 MG tablet Take 1 tablet by mouth daily.  2   No current facility-administered medications for this visit.    Allergies:   Review of patient's allergies indicates no known allergies.   Past Medical History  Diagnosis Date  . Hypertension   . Atrial fibrillation     Past Surgical History  Procedure Laterality Date  . Colonoscopy  >10 years  . Orif fibula fracture  06/24/2012    Procedure: OPEN REDUCTION INTERNAL FIXATION (ORIF) FIBULA FRACTURE;  Surgeon: [name], MD;  Location: [facility];  Service: Orthopedics;  Laterality: Left;  ORIF LEFT FIBULA,  POSSIBLE REPAIR OF SYNDESMOSIS   . Ankle surgery Left      Social History:  The patient  reports that he has been smoking Cigarettes.  He has a 40 pack-year smoking history. He does not have any smokeless tobacco history on file. He reports that he drinks about 3.6 oz of alcohol per week. He reports that he does not use illicit  drugs.   Family History:  The patient's family history includes Other in his brother.    ROS:  Please see the history of present illness. All other systems are reviewed and  Negative to the above problem except as noted.    PHYSICAL EXAM: VS:  BP 132/66 mmHg  Pulse 84  Ht 6' (1.829 m)  Wt 183 lb 9.6 oz (83.28 kg)  BMI 24.90 kg/m2  GEN: Well nourished, well developed, in no acute distress HEENT: normal Neck: no JVD, carotid bruits, or masses Cardiac: RRR; no murmurs, rubs, or gallops,no edema  Respiratory:  clear to auscultation bilaterally, normal work of breathing GI: soft, nontender, nondistended, + BS  No hepatomegaly  MS: no deformity Moving all extremities   Skin: warm and dry, no rash Neuro:  Strength and sensation are intact Psych: euthymic mood, full affect   EKG:  EKG is not ordered today.   Lipid Panel No results found for: CHOL, TRIG, HDL, CHOLHDL, VLDL, LDLCALC, LDLDIRECT    Wt Readings from Last 3 Encounters:  10/12/14 183 lb 9.6 oz (83.28 kg)  04/10/14 189 lb (85.73 kg)  09/08/13 195 lb (88.451 kg)      ASSESSMENT AND PLAN:  1.  PAF  Patinet doing good  No palpitations.  Continue on sam reigmen  2.  HTN  Keep on same regimen  Check BMET    3 HCM  WIll set up for lipids  Check CBC  Signed, [name], MD  10/12/2014 3:57 PM    [facility] [address] [address], [place], [place]  [address] Phone: [phone]; Fax: [phone]

## 2014-10-13 ENCOUNTER — Other Ambulatory Visit (INDEPENDENT_AMBULATORY_CARE_PROVIDER_SITE_OTHER): Payer: 59

## 2014-10-13 DIAGNOSIS — I4891 Unspecified atrial fibrillation: Secondary | ICD-10-CM

## 2014-10-13 DIAGNOSIS — I10 Essential (primary) hypertension: Secondary | ICD-10-CM

## 2014-10-13 LAB — CBC
HCT: 45.5 % (ref 39.0–52.0)
HEMOGLOBIN: 16.1 g/dL (ref 13.0–17.0)
MCHC: 35.4 g/dL (ref 30.0–36.0)
MCV: 101.8 fl — ABNORMAL HIGH (ref 78.0–100.0)
Platelets: 199 10*3/uL (ref 150.0–400.0)
RBC: 4.48 Mil/uL (ref 4.22–5.81)
RDW: 13.2 % (ref 11.5–15.5)
WBC: 8.3 10*3/uL (ref 4.0–10.5)

## 2014-10-13 LAB — LIPID PANEL
CHOLESTEROL: 227 mg/dL — AB (ref 0–200)
HDL: 48.8 mg/dL (ref >39.00)
LDL CALC: 163 mg/dL — AB (ref 0–99)
NonHDL: 178.2
Total CHOL/HDL Ratio: 5
Triglycerides: 77 mg/dL (ref 0.0–149.0)
VLDL: 15.4 mg/dL (ref 0.0–40.0)

## 2014-10-13 LAB — BASIC METABOLIC PANEL
BUN: 19 mg/dL (ref 6–23)
CALCIUM: 9.7 mg/dL (ref 8.4–10.5)
CO2: 28 mEq/L (ref 19–32)
CREATININE: 1.11 mg/dL (ref 0.40–1.50)
Chloride: 103 mEq/L (ref 96–112)
GFR: 70.81 mL/min (ref >60.00)
Glucose, Bld: 91 mg/dL (ref 70–99)
Potassium: 4 mEq/L (ref 3.5–5.1)
SODIUM: 137 meq/L (ref 135–145)

## 2014-10-13 LAB — TSH: TSH: 1.33 u[IU]/mL (ref 0.35–4.50)

## 2014-10-14 ENCOUNTER — Telehealth: Payer: Self-pay | Admitting: *Deleted

## 2014-10-14 DIAGNOSIS — I4891 Unspecified atrial fibrillation: Secondary | ICD-10-CM

## 2014-10-14 DIAGNOSIS — I10 Essential (primary) hypertension: Secondary | ICD-10-CM

## 2014-10-14 MED ORDER — ATORVASTATIN CALCIUM 10 MG PO TABS: 10.0000 mg | ORAL_TABLET | Freq: Every day | ORAL | Status: DC

## 2014-10-14 NOTE — Telephone Encounter (Signed)
Notified patient that Lipitor Rx had been called in to his pharmacy. Also advised him that he will need to return for follow up lab work in 8-10 weeks (late June to mid-July). Patient verbalized understanding and agreement with current treatment plan. He requests a letter be mailed to him regarding lab work so he can put it up on refrigerator as a reminder. Completed.

## 2014-10-14 NOTE — Telephone Encounter (Signed)
-----   Message from Fay Records, MD sent at 10/13/2014  5:15 PM EDT ----- Spoke to patient and wife re labs Discssed cholesterol  He does not eat much fatty foods Agreed to try lipitor empirically to lower  (discussed also imaging options) Would recomm he start lipitor 10 mg   F/U lipid panel and AST in 8 to 10 wks

## 2014-10-27 ENCOUNTER — Telehealth: Payer: Self-pay | Admitting: Internal Medicine

## 2014-10-27 NOTE — Telephone Encounter (Signed)
Discussed with Cyril Mourning A.-pharmacist who rec. Have patient stop medication. Called patient.--LEFT MESSAGE TO CALL BACK.

## 2014-10-27 NOTE — Telephone Encounter (Signed)
New message     Pt c/o medication issue:  1. Name of Medication: lipitor 2. How are you currently taking this medication (dosage and times per day)? 10mg  daily 3. Are you having a reaction (difficulty breathing--STAT)?  Not sure  4. What is your medication issue?  Since being put on lipitor, he has noticed that when he urinates the urine is of a dark color (dark yellow); and  Sometimes his urine "trickles" out, and  He has had leg pain (above the knee) since being put on medication.  He want to stop this medication.  Please advise

## 2014-10-28 MED ORDER — ATORVASTATIN CALCIUM 10 MG PO TABS: ORAL_TABLET | ORAL | Status: DC

## 2014-10-28 NOTE — Telephone Encounter (Signed)
I spoke with the pt's wife and made her aware that the pt needs to stop Atorvastatin 10mg  at this time.  She will contact the office in 2 weeks to let us know if the pt's symptoms have improved and then we will make further recommendations at that time. Mrs Friedland agreed with plan.

## 2014-10-28 NOTE — Telephone Encounter (Signed)
Follow Up      Pt's wife calling stating she is returning Michalene's call.

## 2015-05-28 ENCOUNTER — Ambulatory Visit (INDEPENDENT_AMBULATORY_CARE_PROVIDER_SITE_OTHER): Payer: 59 | Admitting: Internal Medicine

## 2015-05-28 ENCOUNTER — Encounter: Payer: Self-pay | Admitting: Internal Medicine

## 2015-05-28 VITALS — BP 134/86 | HR 71 | Ht 72.0 in | Wt 189.0 lb

## 2015-05-28 DIAGNOSIS — E785 Hyperlipidemia, unspecified: Secondary | ICD-10-CM

## 2015-05-28 DIAGNOSIS — I10 Essential (primary) hypertension: Secondary | ICD-10-CM | POA: Diagnosis not present

## 2015-05-28 MED ORDER — DILTIAZEM HCL ER 240 MG PO CP24: 240.0000 mg | ORAL_CAPSULE | Freq: Every day | ORAL | Status: DC

## 2015-05-28 MED ORDER — LOSARTAN POTASSIUM-HCTZ 100-25 MG PO TABS
1.0000 | ORAL_TABLET | Freq: Every day | ORAL | Status: DC
Start: 2015-05-28 — End: 2015-12-20

## 2015-05-28 NOTE — Progress Notes (Signed)
Cardiology Office Note   Date:  05/28/2015   ID:  Travis Conrad, DOB [birth year], MRN [id number]  PCP:  Pcp Not In System  Cardiologist:   [name], MD   No chief complaint on file.     History of Present Illness: Travis Conrad is a 64 y.o. male with a history of PAF and HTN  Since I saw him in the spring he had 2 spells  No assocaitate dizziness  Breathing is OK     Current Outpatient Prescriptions  Medication Sig Dispense Refill  . diltiazem (DILACOR XR) 240 MG 24 hr capsule Take 240 mg by mouth daily.    [name] losartan-hydrochlorothiazide (HYZAAR) 100-25 MG per tablet TAKE 1 TABLET BY MOUTH DAILY. 90 tablet 2   No current facility-administered medications for this visit.    Allergies:   Review of patient's allergies indicates no known allergies.   Past Medical History  Diagnosis Date  . Hypertension   . Atrial fibrillation [facility])     Past Surgical History  Procedure Laterality Date  . Colonoscopy  >10 years  . Orif fibula fracture  06/24/2012    Procedure: OPEN REDUCTION INTERNAL FIXATION (ORIF) FIBULA FRACTURE;  Surgeon: [name], MD;  Location: [facility];  Service: Orthopedics;  Laterality: Left;  ORIF LEFT FIBULA,  POSSIBLE REPAIR OF SYNDESMOSIS   . Ankle surgery Left      Social History:  The patient  reports that he has been smoking Cigarettes.  He has a 40 pack-year smoking history. He does not have any smokeless tobacco history on file. He reports that he drinks about 3.6 oz of alcohol per week. He reports that he does not use illicit drugs.   Family History:  The patient's family history includes Other in his brother.    ROS:  Please see the history of present illness. All other systems are reviewed and  Negative to the above problem except as noted.    PHYSICAL EXAM: VS:  BP 134/86 mmHg  Pulse 71  Ht 6' (1.829 m)  Wt 85.73 kg (189 lb)  BMI 25.63 kg/m2  GEN: Well nourished, well developed, in no acute distress HEENT: normal Neck: no JVD,  carotid bruits, or masses Cardiac: RRR; no murmurs, rubs, or gallops,no edema  Respiratory:  clear to auscultation bilaterally, normal work of breathing GI: soft, nontender, nondistended, + BS  No hepatomegaly  MS: no deformity Moving all extremities   Skin: warm and dry, no rash Neuro:  Strength and sensation are intact Psych: euthymic mood, full affect   EKG:  EKG is not  ordered today.   Lipid Panel    Component Value Date/Time   CHOL 227* 10/13/2014 1236   TRIG 77.0 10/13/2014 1236   HDL 48.80 10/13/2014 1236   CHOLHDL 5 10/13/2014 1236   VLDL 15.4 10/13/2014 1236   LDLCALC 163* 10/13/2014 1236      Wt Readings from Last 3 Encounters:  05/28/15 85.73 kg (189 lb)  10/12/14 83.28 kg (183 lb 9.6 oz)  04/10/14 85.73 kg (189 lb)      ASSESSMENT AND PLAN:  1  PAF  Few spells since last seen   In a few days CHADSVASc will be 2  I would recomm a NOAC  Discussed risks/benefits with pt  He is reflecting  Will discuss when call with labs  Check CBC and BMET  2.  HTN  Adequate control  3.  HCM  Check lipids    Otherwise f/u in  6 months    Signed, [name], MD  05/28/2015 3:53 PM    [address], [place], [place]  [address] Phone: [phone]; Fax: [phone]

## 2015-05-28 NOTE — Patient Instructions (Addendum)
  Your physician recommends that you return for lab work NEXT WEEK (bmet, lipids, cbc)  Your physician wants you to follow-up in: 6 months with [name].  You will receive a reminder letter in the mail two months in advance. If you don't receive a letter, please call our office to schedule the follow-up appointment.

## 2015-06-04 ENCOUNTER — Other Ambulatory Visit (INDEPENDENT_AMBULATORY_CARE_PROVIDER_SITE_OTHER): Payer: 59 | Admitting: *Deleted

## 2015-06-04 DIAGNOSIS — I10 Essential (primary) hypertension: Secondary | ICD-10-CM

## 2015-06-04 DIAGNOSIS — E785 Hyperlipidemia, unspecified: Secondary | ICD-10-CM | POA: Diagnosis not present

## 2015-06-04 LAB — CBC
HCT: 46.8 % (ref 39.0–52.0)
Hemoglobin: 17 g/dL (ref 13.0–17.0)
MCH: 36.9 pg — AB (ref 26.0–34.0)
MCHC: 36.3 g/dL — AB (ref 30.0–36.0)
MCV: 101.5 fL — AB (ref 78.0–100.0)
MPV: 9.3 fL (ref 8.6–12.4)
PLATELETS: 238 10*3/uL (ref 150–400)
RBC: 4.61 MIL/uL (ref 4.22–5.81)
RDW: 13.8 % (ref 11.5–15.5)
WBC: 8.6 10*3/uL (ref 4.0–10.5)

## 2015-06-04 LAB — BASIC METABOLIC PANEL
BUN: 20 mg/dL (ref 7–25)
CALCIUM: 9.8 mg/dL (ref 8.6–10.3)
CO2: 26 mmol/L (ref 20–31)
Chloride: 99 mmol/L (ref 98–110)
Creat: 1.42 mg/dL — ABNORMAL HIGH (ref 0.70–1.25)
GLUCOSE: 93 mg/dL (ref 65–99)
Potassium: 3.7 mmol/L (ref 3.5–5.3)
SODIUM: 137 mmol/L (ref 135–146)

## 2015-06-04 LAB — LIPID PANEL
CHOLESTEROL: 212 mg/dL — AB (ref 125–200)
HDL: 43 mg/dL (ref >=40)
LDL Cholesterol: 136 mg/dL — ABNORMAL HIGH (ref <130)
TRIGLYCERIDES: 167 mg/dL — AB (ref <150)
Total CHOL/HDL Ratio: 4.9 Ratio (ref <=5.0)
VLDL: 33 mg/dL — ABNORMAL HIGH (ref <30)

## 2015-06-04 LAB — AST: AST: 26 U/L (ref 10–35)

## 2015-06-04 NOTE — Addendum Note (Signed)
Addended by: Eulis Foster on: 06/04/2015 10:22 AM   Modules accepted: Orders

## 2015-06-08 ENCOUNTER — Telehealth: Payer: Self-pay | Admitting: Internal Medicine

## 2015-06-08 NOTE — Telephone Encounter (Signed)
New message    Patient wife calling stating [name] called them on last night - returning call back

## 2015-06-08 NOTE — Telephone Encounter (Signed)
I left patient message to call back yesterday to review labs.  Spoke with Travis Conrad today re: labs.

## 2015-06-09 ENCOUNTER — Telehealth: Payer: Self-pay | Admitting: Internal Medicine

## 2015-06-09 DIAGNOSIS — I480 Paroxysmal atrial fibrillation: Secondary | ICD-10-CM

## 2015-06-09 MED ORDER — RIVAROXABAN 20 MG PO TABS: 20.0000 mg | ORAL_TABLET | Freq: Every day | ORAL | Status: DC

## 2015-06-09 NOTE — Telephone Encounter (Signed)
Notes Recorded by Fay Records, MD on 06/05/2015 at 10:42 PM Electrolytes are OK  CBC is OK  LDL is elevated at 136 It is better than earlier in year when 163 Continue to work with diet.  Reviewed PAF with pt while he was in clinic  Should be on NOAC He was reflecting Would tolerate any of them    Notes Recorded by Rodman Key, RN on 06/08/2015 at 1:07 PM Reviewed labs with patient's wife. She states patient is skeptical about taking a blood thinner. They are going to continue to consider it and will call back if/when he decides

## 2015-06-09 NOTE — Telephone Encounter (Signed)
Would recomm Xarelto 20  F/U CBC and BMET in 3 to 4 months

## 2015-06-09 NOTE — Telephone Encounter (Signed)
New message     Pt said he would go on the blood thinner.  Please call it in to [facility] at [place].  Please call pt back.

## 2015-06-09 NOTE — Telephone Encounter (Signed)
Spoke with patient's wife, informed patient is to start taking xarelto 20 mg daily. appt made for labs in April.

## 2015-09-24 ENCOUNTER — Other Ambulatory Visit (INDEPENDENT_AMBULATORY_CARE_PROVIDER_SITE_OTHER): Payer: Medicare Other | Admitting: *Deleted

## 2015-09-24 DIAGNOSIS — I480 Paroxysmal atrial fibrillation: Secondary | ICD-10-CM

## 2015-09-24 LAB — CBC
HEMATOCRIT: 51 % — AB (ref 38.5–50.0)
Hemoglobin: 18.2 g/dL — ABNORMAL HIGH (ref 13.2–17.1)
MCH: 36.5 pg — AB (ref 27.0–33.0)
MCHC: 35.7 g/dL (ref 32.0–36.0)
MCV: 102.4 fL — AB (ref 80.0–100.0)
MPV: 9.4 fL (ref 7.5–12.5)
PLATELETS: 235 10*3/uL (ref 140–400)
RBC: 4.98 MIL/uL (ref 4.20–5.80)
RDW: 13.5 % (ref 11.0–15.0)
WBC: 7.3 10*3/uL (ref 3.8–10.8)

## 2015-09-24 LAB — BASIC METABOLIC PANEL
BUN: 15 mg/dL (ref 7–25)
CALCIUM: 9.6 mg/dL (ref 8.6–10.3)
CO2: 26 mmol/L (ref 20–31)
CREATININE: 1.77 mg/dL — AB (ref 0.70–1.25)
Chloride: 100 mmol/L (ref 98–110)
GLUCOSE: 90 mg/dL (ref 65–99)
Potassium: 3.6 mmol/L (ref 3.5–5.3)
SODIUM: 139 mmol/L (ref 135–146)

## 2015-09-28 ENCOUNTER — Telehealth: Payer: Self-pay | Admitting: Internal Medicine

## 2015-09-28 DIAGNOSIS — I10 Essential (primary) hypertension: Secondary | ICD-10-CM

## 2015-09-28 NOTE — Telephone Encounter (Signed)
New message      Returning a call to the nurse to get lab results.  Please call before 9 or after 12:30

## 2015-09-28 NOTE — Telephone Encounter (Signed)
Pt and wife are aware of lab results and MD's recommendations. Pt will take 1/2 tablet of Hyzaar pills once daily. Pt to keep a record of his BP daily, and come for Blood work (BMET) on May 9 th 2017. Pt and wife verbalized understanding.

## 2015-10-06 ENCOUNTER — Other Ambulatory Visit: Payer: Self-pay | Admitting: *Deleted

## 2015-10-06 DIAGNOSIS — R7989 Other specified abnormal findings of blood chemistry: Secondary | ICD-10-CM

## 2015-10-25 ENCOUNTER — Other Ambulatory Visit (INDEPENDENT_AMBULATORY_CARE_PROVIDER_SITE_OTHER): Payer: Medicare Other | Admitting: *Deleted

## 2015-10-25 DIAGNOSIS — I10 Essential (primary) hypertension: Secondary | ICD-10-CM

## 2015-10-25 DIAGNOSIS — R7989 Other specified abnormal findings of blood chemistry: Secondary | ICD-10-CM

## 2015-10-25 LAB — BASIC METABOLIC PANEL
BUN: 12 mg/dL (ref 7–25)
CALCIUM: 10 mg/dL (ref 8.6–10.3)
CHLORIDE: 99 mmol/L (ref 98–110)
CO2: 26 mmol/L (ref 20–31)
Creat: 1.06 mg/dL (ref 0.70–1.25)
Glucose, Bld: 100 mg/dL — ABNORMAL HIGH (ref 65–99)
Potassium: 3.8 mmol/L (ref 3.5–5.3)
SODIUM: 139 mmol/L (ref 135–146)

## 2015-10-25 LAB — CBC
HCT: 51.2 % — ABNORMAL HIGH (ref 38.5–50.0)
HEMOGLOBIN: 18.3 g/dL — AB (ref 13.2–17.1)
MCH: 36.5 pg — AB (ref 27.0–33.0)
MCHC: 35.7 g/dL (ref 32.0–36.0)
MCV: 102 fL — AB (ref 80.0–100.0)
MPV: 10 fL (ref 7.5–12.5)
Platelets: 241 10*3/uL (ref 140–400)
RBC: 5.02 MIL/uL (ref 4.20–5.80)
RDW: 14.2 % (ref 11.0–15.0)
WBC: 9.4 10*3/uL (ref 3.8–10.8)

## 2015-10-25 NOTE — Addendum Note (Signed)
Addended by: Eulis Foster on: 10/25/2015 08:13 AM   Modules accepted: Orders

## 2015-10-26 ENCOUNTER — Other Ambulatory Visit: Payer: Medicare Other

## 2015-11-03 ENCOUNTER — Telehealth: Payer: Self-pay | Admitting: Internal Medicine

## 2015-11-03 NOTE — Telephone Encounter (Signed)
Follow Up  3rd attempt to call and get results. Please call

## 2015-11-03 NOTE — Telephone Encounter (Signed)
Reviewed results of lab work. Patients wife reports he has been fatigued for about the last 3 weeks. Thinks he has episodes of afib; lasts few hours at a time.  Pt last seen in December, due in July for follow up. Added to Dr. Alan Ripper schedule for tomorrow am

## 2015-11-03 NOTE — Telephone Encounter (Signed)
Pt's wife returned call re labs

## 2015-11-04 ENCOUNTER — Ambulatory Visit (INDEPENDENT_AMBULATORY_CARE_PROVIDER_SITE_OTHER): Payer: Medicare Other | Admitting: Internal Medicine

## 2015-11-04 ENCOUNTER — Encounter: Payer: Self-pay | Admitting: Internal Medicine

## 2015-11-04 VITALS — BP 126/74 | HR 96 | Ht 72.0 in | Wt 183.1 lb

## 2015-11-04 DIAGNOSIS — I480 Paroxysmal atrial fibrillation: Secondary | ICD-10-CM | POA: Diagnosis not present

## 2015-11-04 DIAGNOSIS — R5383 Other fatigue: Secondary | ICD-10-CM

## 2015-11-04 DIAGNOSIS — R0602 Shortness of breath: Secondary | ICD-10-CM

## 2015-11-04 DIAGNOSIS — Z720 Tobacco use: Secondary | ICD-10-CM | POA: Diagnosis not present

## 2015-11-04 DIAGNOSIS — F17200 Nicotine dependence, unspecified, uncomplicated: Secondary | ICD-10-CM

## 2015-11-04 NOTE — Patient Instructions (Signed)
Your physician recommends that you continue on your current medications as directed. Please refer to the Current Medication list given to you today. Your physician has recommended that you wear an event monitor. Event monitors are medical devices that record the heart's electrical activity. Doctors most often [place] these monitors to diagnose arrhythmias. Arrhythmias are problems with the speed or rhythm of the heartbeat. The monitor is a small, portable device. You can wear one while you do your normal daily activities. This is usually used to diagnose what is causing palpitations/syncope (passing out).  Your physician has recommended that you have a pulmonary function test. Pulmonary Function Tests are a group of tests that measure how well air moves in and out of your lungs.  Be sure to increase your fluid intake.  Drink more water every day.

## 2015-11-04 NOTE — Progress Notes (Signed)
Cardiology Office Note   Date:  11/04/2015   ID:  Travis Conrad, DOB Jun 21, 1950, MRN [id number]  PCP:  Pcp Not In System  Cardiologist:   [name], MD   Pt presents for f/u of atrial fib     History of Present Illness: Travis Conrad is a 65 y.o. male with a history of PAF and HTN  I saw him In December   CHADS VASC of 2   Since I saw him he says that he is feeling weak, fatigued  Thinks it is more afib   Legs feel rubbery  No energy  Comes and goes  No CP        Outpatient Prescriptions Prior to Visit  Medication Sig Dispense Refill  . diltiazem (DILACOR XR) 240 MG 24 hr capsule Take 1 capsule (240 mg total) by mouth daily. 90 capsule 3  . losartan-hydrochlorothiazide (HYZAAR) 100-25 MG tablet Take 1 tablet by mouth daily. 90 tablet 3  . rivaroxaban (XARELTO) 20 MG TABS tablet Take 1 tablet (20 mg total) by mouth daily with supper. 30 tablet 11   No facility-administered medications prior to visit.     Allergies:   Review of patient's allergies indicates no known allergies.   Past Medical History  Diagnosis Date  . Hypertension   . Atrial fibrillation [facility])     Past Surgical History  Procedure Laterality Date  . Colonoscopy  >10 years  . Orif fibula fracture  06/24/2012    Procedure: OPEN REDUCTION INTERNAL FIXATION (ORIF) FIBULA FRACTURE;  Surgeon: [name], MD;  Location: [facility];  Service: Orthopedics;  Laterality: Left;  ORIF LEFT FIBULA,  POSSIBLE REPAIR OF SYNDESMOSIS   . Ankle surgery Left      Social History:  The patient  reports that he has been smoking Cigarettes.  He has a 40 pack-year smoking history. He does not have any smokeless tobacco history on file. He reports that he drinks about 3.6 oz of alcohol per week. He reports that he does not use illicit drugs.   Family History:  The patient's family history includes Other in his brother.    ROS:  Please see the history of present illness. All other systems are reviewed and  Negative to  the above problem except as noted.    PHYSICAL EXAM: VS:  BP 126/74 mmHg  Pulse 96  Ht 6' (1.829 m)  Wt 183 lb 1.9 oz (83.063 kg)  BMI 24.83 kg/m2  GEN: Well nourished, well developed, in no acute distress HEENT: normal Neck: no JVD, carotid bruits, or masses Cardiac: Irreg  no murmurs, rubs, or gallops,no edema  Respiratory: Rhonchi  Mild wheeze   GI: soft, nontender, nondistended, + BS  No hepatomegaly  MS: no deformity Moving all extremities   Skin: warm and dry, no rash Neuro:  Strength and sensation are intact Psych: euthymic mood, full affect   EKG:  EKG is ordered today.  Atrial  96     Lipid Panel    Component Value Date/Time   CHOL 212* 06/04/2015 1022   TRIG 167* 06/04/2015 1022   HDL 43 06/04/2015 1022   CHOLHDL 4.9 06/04/2015 1022   VLDL 33* 06/04/2015 1022   LDLCALC 136* 06/04/2015 1022      Wt Readings from Last 3 Encounters:  11/04/15 183 lb 1.9 oz (83.063 kg)  05/28/15 189 lb (85.73 kg)  10/12/14 183 lb 9.6 oz (83.28 kg)      ASSESSMENT AND PLAN:  1  Fatigue   Not clear if related to afib  Pt with wheezes on exam I would recomm PFTs   I would also set up for event monitor  Encouraged him to stay hydrated  Cr a few wks ago was 1.77  Repeat 1.1   2 erythrocytosis  Hgb 18.3  Will cehck PFTs  Question referral to heme   Signed, [name], MD  11/04/2015 9:25 AM    [facility] [address], [place], [place]  [address] Phone: [phone]; Fax: [phone]

## 2015-11-09 ENCOUNTER — Ambulatory Visit (INDEPENDENT_AMBULATORY_CARE_PROVIDER_SITE_OTHER): Payer: Medicare Other

## 2015-11-09 DIAGNOSIS — Z720 Tobacco use: Secondary | ICD-10-CM | POA: Diagnosis not present

## 2015-11-09 DIAGNOSIS — I480 Paroxysmal atrial fibrillation: Secondary | ICD-10-CM | POA: Diagnosis not present

## 2015-11-09 DIAGNOSIS — R5383 Other fatigue: Secondary | ICD-10-CM

## 2015-11-09 DIAGNOSIS — R0602 Shortness of breath: Secondary | ICD-10-CM

## 2015-11-11 ENCOUNTER — Ambulatory Visit (INDEPENDENT_AMBULATORY_CARE_PROVIDER_SITE_OTHER): Payer: Medicare Other | Admitting: Internal Medicine

## 2015-11-11 DIAGNOSIS — I480 Paroxysmal atrial fibrillation: Secondary | ICD-10-CM

## 2015-11-11 DIAGNOSIS — R5383 Other fatigue: Secondary | ICD-10-CM

## 2015-11-11 DIAGNOSIS — F17200 Nicotine dependence, unspecified, uncomplicated: Secondary | ICD-10-CM

## 2015-11-11 DIAGNOSIS — R0602 Shortness of breath: Secondary | ICD-10-CM

## 2015-11-11 DIAGNOSIS — IMO0001 Reserved for inherently not codable concepts without codable children: Secondary | ICD-10-CM

## 2015-11-11 LAB — PULMONARY FUNCTION TEST
DL/VA % pred: 96 %
DL/VA: 4.56 ml/min/mmHg/L
DLCO COR % PRED: 86 %
DLCO COR: 30.35 ml/min/mmHg
DLCO UNC % PRED: 90 %
DLCO UNC: 31.79 ml/min/mmHg
FEF 25-75 POST: 2.51 L/s
FEF 25-75 Pre: 1.98 L/sec
FEF2575-%Change-Post: 26 %
FEF2575-%PRED-POST: 86 %
FEF2575-%PRED-PRE: 68 %
FEV1-%Change-Post: 4 %
FEV1-%PRED-POST: 86 %
FEV1-%Pred-Pre: 82 %
FEV1-POST: 3.18 L
FEV1-Pre: 3.04 L
FEV1FVC-%Change-Post: 4 %
FEV1FVC-%PRED-PRE: 95 %
FEV6-%Change-Post: 0 %
FEV6-%Pred-Post: 88 %
FEV6-%Pred-Pre: 89 %
FEV6-POST: 4.17 L
FEV6-Pre: 4.18 L
FEV6FVC-%CHANGE-POST: 0 %
FEV6FVC-%PRED-POST: 104 %
FEV6FVC-%Pred-Pre: 104 %
FVC-%CHANGE-POST: 0 %
FVC-%Pred-Post: 85 %
FVC-%Pred-Pre: 85 %
FVC-PRE: 4.22 L
FVC-Post: 4.24 L
POST FEV1/FVC RATIO: 75 %
PRE FEV1/FVC RATIO: 72 %
Post FEV6/FVC ratio: 99 %
Pre FEV6/FVC Ratio: 99 %
RV % pred: 133 %
RV: 3.28 L
TLC % PRED: 100 %
TLC: 7.48 L

## 2015-11-11 NOTE — Progress Notes (Signed)
PFT done today. 

## 2015-11-14 ENCOUNTER — Telehealth: Payer: Self-pay | Admitting: Internal Medicine

## 2015-11-14 NOTE — Telephone Encounter (Signed)
Received alert from [employer] that patient had an episode of atrial flutter with rates in 120s which converted to normal sinus rhythm afterwards. Patient might need anticoagulation so he was told to contact again in the AM to decide about that and discuss this with his primary cardiologist.

## 2015-11-16 ENCOUNTER — Telehealth: Payer: Self-pay | Admitting: *Deleted

## 2015-11-16 DIAGNOSIS — D751 Secondary polycythemia: Secondary | ICD-10-CM

## 2015-11-16 NOTE — Telephone Encounter (Signed)
Received email from monitor tech--from [employer] for:  11/14/15 9:04 am atrial fibrillation HR 120  Contacted by on call Dr. Eula Fried at 11:35 pm.  Pt is on Xarelto and also takes Cardizem.  Pt has known PAF.  Routing to [name] for further recommendation. Monitor applied 11/09/15

## 2015-11-18 NOTE — Telephone Encounter (Signed)
Spoke with patient's wife and informed of PFT results. She wanted to inform [name] that patient is in atrial fibrillation today again.  Have not received any further [employer] reports. It makes him tired and he feels bad.  Also he becomes very anxious and shaky.  Difficult to calm down.  She states he has appointment with his PCP tomorrow for physical.  Patient and his wife want to know what else can be done for his atrial fibrillation at this time.  Advised I will inform [name] and someone will call them back with any new recommendations.

## 2015-11-18 NOTE — Telephone Encounter (Signed)
Will review event monitor when complete to decide on best Rx.

## 2015-11-23 ENCOUNTER — Telehealth: Payer: Self-pay | Admitting: *Deleted

## 2015-11-23 NOTE — Telephone Encounter (Signed)
Patient wife returned my call.  See previous phone note.  She reports these "episodes" that he has been having have been each morning for the past 5 mornings.  They last approx 3-4 hours from when he wakes up.  He records symptoms on his event monitor.  After that he feels better. He has been taking his diltiazem in the am. I advised that he start taking diltiazem every night before bed to see if that is helpful.   He will start this tonight. Reminded also that patient is on blood thinner so that his stroke risk is lower, also encouraged to continue to record symptoms to the monitor so that [name] can see his symptoms and whether or not they correlate to any arrythmia.  Also reminded that the monitor company will contact him and our office with any serious/signifcant arrhythmias.  She verbalizes understanding and will communicate this with him

## 2015-11-23 NOTE — Telephone Encounter (Signed)
Left detailed message that [name] will wait until event monitor is complete before deciding on best treatment.  Advised to call back if any further questions/concerns.

## 2015-11-24 ENCOUNTER — Encounter: Payer: Self-pay | Admitting: Oncology

## 2015-11-24 ENCOUNTER — Telehealth: Payer: Self-pay | Admitting: Oncology

## 2015-11-24 NOTE — Telephone Encounter (Signed)
Verified address and insurance, in basket referring provider appt date/time, contact pt and mailed out new pt packet, completed intake

## 2015-12-03 ENCOUNTER — Ambulatory Visit (HOSPITAL_BASED_OUTPATIENT_CLINIC_OR_DEPARTMENT_OTHER): Payer: Medicare Other | Admitting: Oncology

## 2015-12-03 ENCOUNTER — Telehealth: Payer: Self-pay | Admitting: Oncology

## 2015-12-03 VITALS — BP 128/78 | HR 86 | Temp 98.0°F | Resp 18 | Ht 72.0 in | Wt 182.8 lb

## 2015-12-03 DIAGNOSIS — D751 Secondary polycythemia: Secondary | ICD-10-CM | POA: Diagnosis not present

## 2015-12-03 NOTE — Progress Notes (Signed)
Please see consult note.  

## 2015-12-03 NOTE — Telephone Encounter (Signed)
Gave pt apt & avs °

## 2015-12-03 NOTE — Consult Note (Signed)
Reason for Referral: Polycythemia.   HPI: 65 year old gentleman currently of [place] where he lived the majority of his life. He is a gentleman with history of hypertension and atrial fibrillation but for the most part in reasonable health. He is currently chronically anticoagulated with Xarelto because of his atrial fibrillation. He was noted to have increase in his hemoglobin on 10/25/2015. His hemoglobin was 18.3 with hematocrit of 51. His MCV was 102. Previous counts dating back to 2015 showed fluctuating erythrocytosis with a hemoglobin up to 19.8 in January 2015 and normalizing again in April 2016. He denied any thrombotic events such as strokes or myocardial infarctions. He denied any other constitutional symptoms. He does smoke on a daily basis about 1 pack per day. 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 He denied any hormonal supplements or testosterone injections. He denied any travel to higher elevation. He does report some fatigue and tiredness associated with his atrial fibrillation when his heart is at out of rhythm.  He does not report any headaches, blurry vision, syncope or seizures. He does not report any fevers, chills, sweats or weight loss. He does not report any chest pain, palpitation, orthopnea or leg edema. Does not report any cough, wheezing or hemoptysis. Does not report any nausea, vomiting, abdominal pain. He does not report any frequency urgency or hesitancy. Does not report any skeletal complaints of arthralgias or myalgias. Remaining review of systems unremarkable.   Past Medical History  Diagnosis Date  . Hypertension   . Atrial fibrillation [facility])   :  Past Surgical History  Procedure Laterality Date  . Colonoscopy  >10 years  . Orif fibula fracture  06/24/2012    Procedure: OPEN REDUCTION INTERNAL FIXATION (ORIF) FIBULA FRACTURE;  Surgeon: [name], MD;  Location: [place];  Service: Orthopedics;  Laterality: Left;  ORIF  LEFT FIBULA,  POSSIBLE REPAIR OF SYNDESMOSIS   . Ankle surgery Left   :   Current outpatient prescriptions:  .  diltiazem (DILACOR XR) 240 MG 24 hr capsule, Take 1 capsule (240 mg total) by mouth daily., Disp: 90 capsule, Rfl: 3 .  losartan-hydrochlorothiazide (HYZAAR) 100-25 MG tablet, Take 1 tablet by mouth daily., Disp: 90 tablet, Rfl: 3 .  rivaroxaban (XARELTO) 20 MG TABS tablet, Take 1 tablet (20 mg total) by mouth daily with supper., Disp: 30 tablet, Rfl: 11:  No Known Allergies:  Family History  Problem Relation Age of Onset  . Other Brother   :  Social History   Social History  . Marital Status: Married    Spouse Name: N/A  . Number of Children: N/A  . Years of Education: N/A   Occupational History  . Not on file.   Social History Main Topics  . Smoking status: Current Every Day Smoker -- 1.00 packs/day for 40 years    Types: Cigarettes  . Smokeless tobacco: Not on file  . Alcohol Use: 3.6 oz/week    6 Shots of liquor per week     Comment: 1 pint /day  . Drug Use: No     Comment: 15 years ago  . Sexual Activity: Yes   Other Topics Concern  . Not on file   Social History Narrative  :  Pertinent items are noted in HPI.  Exam: Blood pressure 128/78, pulse 86, temperature 98 F (36.7 C), temperature source Oral, resp. rate 18, height 6' (1.829 m), weight 182 lb 12.8 oz (82.918 kg), SpO2 97 %. General appearance:  alert and cooperative appeared without distress. Head: Normocephalic, without obvious abnormality. Red complexions noted with slight erythema in his sclera. Throat: lips, mucosa, and tongue normal; teeth and gums normal Neck: no adenopathy, no carotid bruit and no JVD Back: negative Resp: clear to auscultation bilaterally Chest wall: no tenderness Cardio: regular rate and rhythm, S1, S2 normal, no murmur, click, rub or gallop GI: soft, non-tender; bowel sounds normal; no masses,  no organomegaly Pulses: 2+ and symmetric Skin: Skin color,  texture, turgor normal. No rashes or lesions Lymph nodes: Cervical, supraclavicular, and axillary nodes normal.  CBC    Component Value Date/Time   WBC 9.4 10/25/2015 0813   RBC 5.02 10/25/2015 0813   HGB 18.3* 10/25/2015 0813   HCT 51.2* 10/25/2015 0813   PLT 241 10/25/2015 0813   MCV 102.0* 10/25/2015 0813   MCH 36.5* 10/25/2015 0813   MCHC 35.7 10/25/2015 0813   RDW 14.2 10/25/2015 0813      Assessment and Plan:   65 year old gentleman with the following issues:  1. Polycythemia: He presented with fluctuating hemoglobin as high as 19.8 in January 2015 and most recently 56.3. He had a normal white cell count and normal platelet count. The differential diagnosis was discussed today with the patient and his wife. Secondary causes include smoking, toxic fume exposures as well as the use of diuretics are likely etiology. He endorsed heavy smoking history for many years at this time. He denied any signs and symptoms of obstructive sleep apnea.  Primary causes such as polycythemia vera are also a consideration although considered to be most likely in this situation.  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 If these maneuvers do not improve his hematocrit count, therapeutic phlebotomy can be utilized in the future.  2. Thrombosis prophylaxis: His risk of thrombosis is low given the fact that he is a chronically anticoagulated at this time. He has no history of thrombosis in the past which makes him less likely to develop in the future clotting episodes associated with polycythemia.  3. Follow-up: Will be in 3 months to repeat his CBC I will also obtain erythropoietin level as well as JAK-2 mutation to rule out polycythemia vera.

## 2015-12-20 ENCOUNTER — Encounter: Payer: Self-pay | Admitting: Internal Medicine

## 2015-12-20 ENCOUNTER — Ambulatory Visit (INDEPENDENT_AMBULATORY_CARE_PROVIDER_SITE_OTHER): Payer: Medicare Other | Admitting: Internal Medicine

## 2015-12-20 VITALS — BP 118/74 | HR 87 | Ht 72.0 in | Wt 182.8 lb

## 2015-12-20 DIAGNOSIS — E785 Hyperlipidemia, unspecified: Secondary | ICD-10-CM

## 2015-12-20 DIAGNOSIS — I10 Essential (primary) hypertension: Secondary | ICD-10-CM

## 2015-12-20 MED ORDER — LOSARTAN POTASSIUM-HCTZ 100-25 MG PO TABS: 1.0000 | ORAL_TABLET | Freq: Every day | ORAL | Status: DC

## 2015-12-20 MED ORDER — DILTIAZEM HCL ER 240 MG PO CP24: 240.0000 mg | ORAL_CAPSULE | Freq: Every day | ORAL | Status: DC

## 2015-12-20 NOTE — Patient Instructions (Signed)
Your physician recommends that you continue on your current medications as directed. Please refer to the Current Medication list given to you today. Your physician wants you to follow-up in: February, 2018 with [name].  You will receive a reminder letter in the mail two months in advance. If you don't receive a letter, please call our office to schedule the follow-up appointment.

## 2015-12-20 NOTE — Progress Notes (Signed)
Cardiology Office Note   Date:  12/20/2015   ID:  Travis, Conrad 1950-10-25, MRN [id number]  PCP:  [name], MD  Cardiologist:   [name], MD   Chief Complaint  Patient presents with  . Follow-up    monitor results   F/U of PAF     History of Present Illness: Travis Conrad is a 65 y.o. male with a history of PAF and HTN  I saw him in May 2017  He complained of fatigue  I recomm PFTs and an event monitor   Event monitor showed SR and also Afib  He did not always sense rapid afib    PFTs showed no signif abnormality   He has been seen by Onc  They felt most likely increased Hg was due to secondary causes  Will f/u later in fall  The pt has started taking Dilt at night   Finds he has few palpitations in the am  Taking Xarelto in AM       Outpatient Prescriptions Prior to Visit  Medication Sig Dispense Refill  . losartan-hydrochlorothiazide (HYZAAR) 100-25 MG tablet Take 1 tablet by mouth daily. 90 tablet 3  . rivaroxaban (XARELTO) 20 MG TABS tablet Take 1 tablet (20 mg total) by mouth daily with supper. 30 tablet 11  . diltiazem (DILACOR XR) 240 MG 24 hr capsule Take 1 capsule (240 mg total) by mouth daily. 90 capsule 3   No facility-administered medications prior to visit.     Allergies:   Review of patient's allergies indicates no known allergies.   Past Medical History  Diagnosis Date  . Hypertension   . Atrial fibrillation [facility])     Past Surgical History  Procedure Laterality Date  . Colonoscopy  >10 years  . Orif fibula fracture  06/24/2012    Procedure: OPEN REDUCTION INTERNAL FIXATION (ORIF) FIBULA FRACTURE;  Surgeon: [name], MD;  Location: [place];  Service: Orthopedics;  Laterality: Left;  ORIF LEFT FIBULA,  POSSIBLE REPAIR OF SYNDESMOSIS   . Ankle surgery Left      Social History:  The patient  reports that he has been smoking Cigarettes.  He has a 40 pack-year smoking history. He does not have any smokeless tobacco history on file. He  reports that he drinks about 3.6 oz of alcohol per week. He reports that he does not use illicit drugs.   Family History:  The patient's family history includes Other in his brother.    ROS:  Please see the history of present illness. All other systems are reviewed and  Negative to the above problem except as noted.    PHYSICAL EXAM: VS:  BP 118/74 mmHg  Pulse 87  Ht 6' (1.829 m)  Wt 182 lb 12.8 oz (82.918 kg)  BMI 24.79 kg/m2  GEN: Well nourished, well developed, in no acute distress HEENT: normal Neck: no JVD, carotid bruits, or masses Cardiac: RRR; no murmurs, rubs, or gallops,no edema  Respiratory:  Rhonchi that clear with cough  Some decreased airflow  GI: soft, nontender, nondistended, + BS  No hepatomegaly  MS: no deformity Moving all extremities   Skin: warm and dry, no rash Neuro:  Strength and sensation are intact Psych: euthymic mood, full affect   EKG:  EKG is not ordered today.   Lipid Panel    Component Value Date/Time   CHOL 212* 06/04/2015 1022   TRIG 167* 06/04/2015 1022   HDL 43 06/04/2015 1022   CHOLHDL 4.9  06/04/2015 1022   VLDL 33* 06/04/2015 1022   LDLCALC 136* 06/04/2015 1022      Wt Readings from Last 3 Encounters:  12/20/15 182 lb 12.8 oz (82.918 kg)  12/03/15 182 lb 12.8 oz (82.918 kg)  11/04/15 183 lb 1.9 oz (83.063 kg)      ASSESSMENT AND PLAN:  1  PAF   Pt in SR clinically  I would keep on same reigmen  Continue anticoagulaton  Since event monitor I am not convinced he is symptomatic with afib    2  Increased Hgb  F/u in oncology  3  Tob  Counselled on cessation    F/U in Feb   Signed, Travis Joung, MD  12/20/2015 4:09 PM    [address], [place], [place]  [address] Phone: [phone]; Fax: [phone]

## 2016-03-03 ENCOUNTER — Other Ambulatory Visit (HOSPITAL_BASED_OUTPATIENT_CLINIC_OR_DEPARTMENT_OTHER): Payer: Medicare Other

## 2016-03-03 DIAGNOSIS — D751 Secondary polycythemia: Secondary | ICD-10-CM

## 2016-03-03 LAB — CBC WITH DIFFERENTIAL/PLATELET
BASO%: 0.7 % (ref 0.0–2.0)
Basophils Absolute: 0.1 10*3/uL (ref 0.0–0.1)
EOS ABS: 0.1 10*3/uL (ref 0.0–0.5)
EOS%: 1 % (ref 0.0–7.0)
HEMATOCRIT: 54.2 % — AB (ref 38.4–49.9)
HEMOGLOBIN: 18.7 g/dL — AB (ref 13.0–17.1)
LYMPH#: 3 10*3/uL (ref 0.9–3.3)
LYMPH%: 38.9 % (ref 14.0–49.0)
MCH: 36.7 pg — ABNORMAL HIGH (ref 27.2–33.4)
MCHC: 34.6 g/dL (ref 32.0–36.0)
MCV: 106 fL — AB (ref 79.3–98.0)
MONO#: 0.6 10*3/uL (ref 0.1–0.9)
MONO%: 8.3 % (ref 0.0–14.0)
NEUT%: 51.1 % (ref 39.0–75.0)
NEUTROS ABS: 3.9 10*3/uL (ref 1.5–6.5)
PLATELETS: 204 10*3/uL (ref 140–400)
RBC: 5.11 10*6/uL (ref 4.20–5.82)
RDW: 14.2 % (ref 11.0–14.6)
WBC: 7.7 10*3/uL (ref 4.0–10.3)

## 2016-03-03 LAB — COMPREHENSIVE METABOLIC PANEL
ALT: 31 U/L (ref 0–55)
AST: 36 U/L — AB (ref 5–34)
Albumin: 3.8 g/dL (ref 3.5–5.0)
Alkaline Phosphatase: 116 U/L (ref 40–150)
Anion Gap: 13 mEq/L — ABNORMAL HIGH (ref 3–11)
BUN: 13.6 mg/dL (ref 7.0–26.0)
CALCIUM: 9.6 mg/dL (ref 8.4–10.4)
CHLORIDE: 104 meq/L (ref 98–109)
CO2: 25 mEq/L (ref 22–29)
Creatinine: 1.2 mg/dL (ref 0.7–1.3)
EGFR: 64 mL/min/{1.73_m2} — AB (ref >90)
GLUCOSE: 89 mg/dL (ref 70–140)
POTASSIUM: 4.3 meq/L (ref 3.5–5.1)
SODIUM: 142 meq/L (ref 136–145)
Total Bilirubin: 0.71 mg/dL (ref 0.20–1.20)
Total Protein: 7.3 g/dL (ref 6.4–8.3)

## 2016-03-04 LAB — ERYTHROPOIETIN: Erythropoietin: 15.5 m[IU]/mL (ref 2.6–18.5)

## 2016-03-10 ENCOUNTER — Telehealth: Payer: Self-pay | Admitting: Oncology

## 2016-03-10 ENCOUNTER — Ambulatory Visit (HOSPITAL_BASED_OUTPATIENT_CLINIC_OR_DEPARTMENT_OTHER): Payer: Medicare Other | Admitting: Oncology

## 2016-03-10 VITALS — BP 128/78 | HR 80 | Temp 98.4°F | Resp 18 | Ht 72.0 in | Wt 179.4 lb

## 2016-03-10 DIAGNOSIS — D751 Secondary polycythemia: Secondary | ICD-10-CM | POA: Diagnosis not present

## 2016-03-10 DIAGNOSIS — R634 Abnormal weight loss: Secondary | ICD-10-CM | POA: Diagnosis not present

## 2016-03-10 DIAGNOSIS — Z7901 Long term (current) use of anticoagulants: Secondary | ICD-10-CM

## 2016-03-10 DIAGNOSIS — Z720 Tobacco use: Secondary | ICD-10-CM | POA: Diagnosis not present

## 2016-03-10 DIAGNOSIS — R1013 Epigastric pain: Secondary | ICD-10-CM

## 2016-03-10 DIAGNOSIS — R0789 Other chest pain: Secondary | ICD-10-CM

## 2016-03-10 NOTE — Telephone Encounter (Signed)
Gave patient avs report and appointments for March. Central radiology will call re ct - patient aware.

## 2016-03-10 NOTE — Progress Notes (Signed)
Hematology and Oncology Follow Up Visit  Travis Conrad [id number] March 28, 1951 65 y.o. 03/10/2016 9:28 AM Travis Conrad, MDSun, [name], MD   Principle Diagnosis: 65 year old gentleman with polycythemia, macrocytosis and weight loss. This was a documented in May 2017 with unclear etiology. JAK2 mutation was not detected.   Current therapy: Evaluation for possible need for phlebotomy.  Interim History:   Travis Conrad presents today for a follow-up visit. Since the last visit, he reports no major changes in his health. He continues to have unexplained weight loss as well as periodic epigastric abdominal pain. Does not report any major changes in his bowel habits without any constipation or diarrhea. He does report having colonoscopy in the past although it is more than 10 years ago. He does report some occasional dyspnea on exertion but he continues to smoke heavily. He also drinks a pint of whiskey on a daily basis.  He does not report any headaches, blurry vision, syncope or seizures. He does not report any fevers, chills, sweats. He does not report any palpitation, orthopnea or leg edema. Does not report any cough, wheezing or hemoptysis. Does not report any nausea, vomiting, and does report early satiety. He does not report any frequency urgency or hesitancy. Does not report any skeletal complaints of arthralgias or myalgias. Remaining review of systems unremarkable.   Medications: I have reviewed the patient's current medications.  Current Outpatient Prescriptions  Medication Sig Dispense Refill  . diltiazem (DILACOR XR) 240 MG 24 hr capsule Take 1 capsule (240 mg total) by mouth daily with supper. 90 capsule 3  . losartan-hydrochlorothiazide (HYZAAR) 100-25 MG tablet Take 1 tablet by mouth daily. 90 tablet 3  . rivaroxaban (XARELTO) 20 MG TABS tablet Take 1 tablet (20 mg total) by mouth daily with supper. 30 tablet 11   No current facility-administered medications for this visit.      Allergies:  No Known Allergies  Past Medical History, Surgical history, Social history, and Family History were reviewed and updated.   Physical Exam: Blood pressure 128/78, pulse 80, temperature 98.4 F (36.9 C), temperature source Oral, resp. rate 18, height 6' (1.829 m), weight 179 lb 6.4 oz (81.4 kg), SpO2 97 %. ECOG: 1 General appearance: alert and cooperative Head: Normocephalic, without obvious abnormality Neck: no adenopathy Lymph nodes: Cervical, supraclavicular, and axillary nodes normal. Heart:regular rate and rhythm, S1, S2 normal, no murmur, click, rub or gallop Lung:chest clear, no wheezing, rales, normal symmetric air entry, Heart exam - S1, S2 normal, no murmur, no gallop, rate regular Abdomin: soft, non-tender, without masses or organomegaly EXT:no erythema, induration, or nodules   Lab Results: Lab Results  Component Value Date   WBC 7.7 03/03/2016   HGB 18.7 (H) 03/03/2016   HCT 54.2 (H) 03/03/2016   MCV 106.0 (H) 03/03/2016   PLT 204 03/03/2016     Chemistry      Component Value Date/Time   NA 142 03/03/2016 0921   K 4.3 03/03/2016 0921   CL 99 10/25/2015 0813   CO2 25 03/03/2016 0921   BUN 13.6 03/03/2016 0921   CREATININE 1.2 03/03/2016 0921      Component Value Date/Time   CALCIUM 9.6 03/03/2016 0921   ALKPHOS 116 03/03/2016 0921   AST 36 (H) 03/03/2016 0921   ALT 31 03/03/2016 0921   BILITOT 0.71 03/03/2016 0921       Impression and Plan:  1. Polycythemia: He presented with fluctuating hemoglobin as high as 19.8 in January 2015 and most recently 18.7. He  had a normal white cell count and normal platelet count.   Radiology of his polycythemia is likely secondary in nature. Polycythemia vera appears to be less likely. Occult malignancy causing perineoplastic polycythemia is a possibility given his presentation.  A management standpoint I do not recommend therapeutic phlebotomy at this time as he is asymptomatic from his polycythemia. We will continue to  monitor periodically and institute phlebotomy if he becomes symptomatic.  2. Weight loss, epigastric pain and polysubstance abuse: He could have a malignancy that is causing his polycythemia as well as abdominal pain and weight loss. Pancreatic cancer as well as lung malignancy could present with similar signs and symptoms. I discussed with him obtaining imaging studies to rule out out and is agreeable to proceed with that. I recommended CT scan chest abdomen and pelvis to evaluate his symptoms. This will be done in the near future and we'll communicate the results to him.  3. Thrombosis prophylaxis: His risk of thrombosis is low and he is on full dose anticoagulation with Xarelto.  4. Follow-up: Will be in 6 months sooner if needed to.     [facility], MD 9/22/20179:28 AM

## 2016-03-17 ENCOUNTER — Ambulatory Visit (HOSPITAL_COMMUNITY)
Admission: RE | Admit: 2016-03-17 | Discharge: 2016-03-17 | Disposition: A | Discharge status: Home / Self Care | Payer: Medicare Other | Source: Ambulatory Visit | Attending: Oncology | Admitting: Oncology

## 2016-03-17 ENCOUNTER — Encounter (HOSPITAL_COMMUNITY): Payer: Self-pay

## 2016-03-17 DIAGNOSIS — I2510 Atherosclerotic heart disease of native coronary artery without angina pectoris: Secondary | ICD-10-CM | POA: Insufficient documentation

## 2016-03-17 DIAGNOSIS — R0789 Other chest pain: Secondary | ICD-10-CM | POA: Insufficient documentation

## 2016-03-17 DIAGNOSIS — I700 Atherosclerosis of aorta: Secondary | ICD-10-CM | POA: Diagnosis not present

## 2016-03-17 DIAGNOSIS — R918 Other nonspecific abnormal finding of lung field: Secondary | ICD-10-CM | POA: Insufficient documentation

## 2016-03-17 DIAGNOSIS — J439 Emphysema, unspecified: Secondary | ICD-10-CM | POA: Insufficient documentation

## 2016-03-17 DIAGNOSIS — R1013 Epigastric pain: Secondary | ICD-10-CM

## 2016-03-17 DIAGNOSIS — R634 Abnormal weight loss: Secondary | ICD-10-CM | POA: Diagnosis not present

## 2016-03-17 DIAGNOSIS — K760 Fatty (change of) liver, not elsewhere classified: Secondary | ICD-10-CM | POA: Diagnosis not present

## 2016-03-17 DIAGNOSIS — K5730 Diverticulosis of large intestine without perforation or abscess without bleeding: Secondary | ICD-10-CM | POA: Diagnosis not present

## 2016-03-17 MED ORDER — IOPAMIDOL (ISOVUE-300) INJECTION 61%
100.0000 mL | Freq: Once | INTRAVENOUS | Status: AC | PRN
  Administered 2016-03-17: 100 mL via INTRAVENOUS

## 2016-03-21 NOTE — Progress Notes (Signed)
Spoke with patient. Let him know that his last scan was normal.

## 2016-05-15 ENCOUNTER — Other Ambulatory Visit: Payer: Self-pay | Admitting: Internal Medicine

## 2016-05-15 DIAGNOSIS — E785 Hyperlipidemia, unspecified: Secondary | ICD-10-CM

## 2016-05-15 DIAGNOSIS — I10 Essential (primary) hypertension: Secondary | ICD-10-CM

## 2016-05-19 ENCOUNTER — Other Ambulatory Visit: Payer: Self-pay | Admitting: Internal Medicine

## 2016-05-19 DIAGNOSIS — E785 Hyperlipidemia, unspecified: Secondary | ICD-10-CM

## 2016-05-19 DIAGNOSIS — I10 Essential (primary) hypertension: Secondary | ICD-10-CM

## 2016-05-19 NOTE — Telephone Encounter (Signed)
Medication Detail    Disp Refills Start End   losartan-hydrochlorothiazide (HYZAAR) 100-25 MG tablet 90 tablet 3 12/20/2015    Sig - Route: Take 1 tablet by mouth daily. - Oral   Patient taking differently: Take 1 tablet by mouth daily.        E-Prescribing Status: Receipt confirmed by pharmacy (12/20/2015 4:21 PM EDT)   Associated Diagnoses   Dyslipidemia - Primary     Essential hypertension     Pharmacy   [id number] - [place], [place] - [address]

## 2016-06-22 ENCOUNTER — Other Ambulatory Visit: Payer: Self-pay | Admitting: Internal Medicine

## 2016-07-24 ENCOUNTER — Encounter: Payer: Self-pay | Admitting: Internal Medicine

## 2016-07-24 ENCOUNTER — Encounter (INDEPENDENT_AMBULATORY_CARE_PROVIDER_SITE_OTHER): Payer: Self-pay

## 2016-07-24 ENCOUNTER — Ambulatory Visit (INDEPENDENT_AMBULATORY_CARE_PROVIDER_SITE_OTHER): Payer: Medicare Other | Admitting: Internal Medicine

## 2016-07-24 VITALS — BP 128/74 | HR 88 | Ht 72.0 in | Wt 190.1 lb

## 2016-07-24 DIAGNOSIS — I480 Paroxysmal atrial fibrillation: Secondary | ICD-10-CM | POA: Diagnosis not present

## 2016-07-24 NOTE — Patient Instructions (Addendum)
Your physician recommends that you continue on your current medications as directed. Please refer to the Current Medication list given to you today.  Please follow up re: hospital admission for Sotalol administration.

## 2016-07-24 NOTE — Progress Notes (Signed)
Cardiology Office Note   Date:  07/24/2016   ID:  Wright, Molle [birth year], MRN [id number]  PCP:  [name], MD  Cardiologist:   [name], MD   Pt presents for f/u of PAF   History of Present Illness: Travis Conrad is a 66 y.o. male with a history of PAF and HTN  I saw him in July 2017  Event monitor showed SR and aifb  PFTs normal    Since I saw him has had more problems with atrial fib he says  Feels his heart racing often  Worse with stress.  Can last hours.           Current Meds  Medication Sig  . diltiazem (DILACOR XR) 240 MG 24 hr capsule Take 1 capsule (240 mg total) by mouth daily with supper.  . losartan-hydrochlorothiazide (HYZAAR) 100-25 MG tablet Take 1 tablet by mouth daily. (Patient taking differently: Take 1 tablet by mouth daily. Take 1/2 tablet daily)  . XARELTO 20 MG TABS tablet TAKE 1 TABLET(20 MG) BY MOUTH DAILY WITH DINNER     Allergies:   Patient has no known allergies.   Past Medical History:  Diagnosis Date  . Atrial fibrillation ([place])   . Hypertension     Past Surgical History:  Procedure Laterality Date  . ANKLE SURGERY Left   . COLONOSCOPY  >10 years  . ORIF FIBULA FRACTURE  06/24/2012   Procedure: OPEN REDUCTION INTERNAL FIXATION (ORIF) FIBULA FRACTURE;  Surgeon: [name], MD;  Location: [place];  Service: Orthopedics;  Laterality: Left;  ORIF LEFT FIBULA,  POSSIBLE REPAIR OF SYNDESMOSIS      Social History:  The patient  reports that he has been smoking Cigarettes.  He has a 40.00 pack-year smoking history. He has never used smokeless tobacco. He reports that he drinks about 3.6 oz of alcohol per week . He reports that he does not use drugs.   Family History:  The patient's family history includes Other in his brother.    ROS:  Please see the history of present illness. All other systems are reviewed and  Negative to the above problem except as noted.    PHYSICAL EXAM: VS:  BP 128/74   Pulse 88   Ht 6' (1.829 m)    Wt 190 lb 1.9 oz (86.2 kg)   SpO2 95%   BMI 25.78 kg/m   GEN: Well nourished, well developed, in no acute distress  HEENT: normal  Neck: no JVD, carotid bruits, or masses Cardiac: RRR; no murmurs, rubs, or gallops,no edema  Respiratory  Wheezes  Pops  Moving air   GI: soft, nontender, nondistended, + BS  No hepatomegaly  MS: no deformity Moving all extremities   Skin: warm and dry, no rash Neuro:  Strength and sensation are intact Psych: euthymic mood, full affect   EKG:  EKG is ordered today.  SR 81 bpm     Lipid Panel    Component Value Date/Time   CHOL 212 (H) 06/04/2015 1022   TRIG 167 (H) 06/04/2015 1022   HDL 43 06/04/2015 1022   CHOLHDL 4.9 06/04/2015 1022   VLDL 33 (H) 06/04/2015 1022   LDLCALC 136 (H) 06/04/2015 1022      Wt Readings from Last 3 Encounters:  07/24/16 190 lb 1.9 oz (86.2 kg)  03/10/16 179 lb 6.4 oz (81.4 kg)  12/20/15 182 lb 12.8 oz (82.9 kg)      ASSESSMENT AND PLAN:  1  PAF  I have reviewed with EP  Pt has evid of CAD on CT scan  With this I would recomm sotalol 120 bid  Watch dilt dosing (Follow HR) I described plan with pt   He would need to be hosp for a couple days  H If this does not wark would refer to EP for eval of ablation. Continue xarelto  2  HTN  Adequate control  Keep on meds  3.  Tob  Counselled on cessation.     Current medicines are reviewed at length with the patient today.  The patient does not have concerns regarding medicines.  Signed, [name], MD  07/24/2016 9:48 AM    [address], [place], [place]  [address] Phone: [phone]; Fax: [phone]

## 2016-07-25 ENCOUNTER — Encounter: Payer: Self-pay | Admitting: Internal Medicine

## 2016-07-31 ENCOUNTER — Encounter: Payer: Self-pay | Admitting: Internal Medicine

## 2016-08-01 ENCOUNTER — Encounter: Payer: Self-pay | Admitting: Internal Medicine

## 2016-08-14 ENCOUNTER — Ambulatory Visit (INDEPENDENT_AMBULATORY_CARE_PROVIDER_SITE_OTHER): Payer: Medicare Other | Admitting: Pharmacist

## 2016-08-14 ENCOUNTER — Encounter (HOSPITAL_COMMUNITY): Payer: Self-pay | Admitting: General Practice

## 2016-08-14 ENCOUNTER — Inpatient Hospital Stay (HOSPITAL_COMMUNITY)
Admission: AD | Admit: 2016-08-14 | Discharge: 2016-08-16 | DRG: 310 | Disposition: A | Discharge status: Home / Self Care | Payer: Medicare Other | Source: Ambulatory Visit | Attending: Internal Medicine | Admitting: Internal Medicine

## 2016-08-14 DIAGNOSIS — F1010 Alcohol abuse, uncomplicated: Secondary | ICD-10-CM | POA: Diagnosis present

## 2016-08-14 DIAGNOSIS — Z79899 Other long term (current) drug therapy: Secondary | ICD-10-CM

## 2016-08-14 DIAGNOSIS — I480 Paroxysmal atrial fibrillation: Secondary | ICD-10-CM | POA: Diagnosis present

## 2016-08-14 DIAGNOSIS — I10 Essential (primary) hypertension: Secondary | ICD-10-CM | POA: Diagnosis present

## 2016-08-14 DIAGNOSIS — I4891 Unspecified atrial fibrillation: Secondary | ICD-10-CM | POA: Diagnosis present

## 2016-08-14 HISTORY — DX: Pure hypercholesterolemia, unspecified: E78.00

## 2016-08-14 HISTORY — DX: Paroxysmal atrial fibrillation: I48.0

## 2016-08-14 LAB — BASIC METABOLIC PANEL
BUN / CREAT RATIO: 9 — AB (ref 10–24)
BUN: 10 mg/dL (ref 8–27)
CALCIUM: 9.6 mg/dL (ref 8.6–10.2)
CHLORIDE: 101 mmol/L (ref 96–106)
CO2: 26 mmol/L (ref 18–29)
Creatinine, Ser: 1.14 mg/dL (ref 0.76–1.27)
GFR calc non Af Amer: 67 mL/min/{1.73_m2} (ref >59)
GFR, EST AFRICAN AMERICAN: 77 mL/min/{1.73_m2} (ref >59)
GLUCOSE: 150 mg/dL — AB (ref 65–99)
POTASSIUM: 3.8 mmol/L (ref 3.5–5.2)
Sodium: 136 mmol/L (ref 134–144)

## 2016-08-14 LAB — MAGNESIUM: Magnesium: 1.7 mg/dL (ref 1.6–2.3)

## 2016-08-14 MED ORDER — SODIUM CHLORIDE 0.9% FLUSH: 3.0000 mL | INTRAVENOUS | Status: DC | PRN

## 2016-08-14 MED ORDER — HYDROCHLOROTHIAZIDE 25 MG PO TABS
25.0000 mg | ORAL_TABLET | Freq: Every day | ORAL | Status: DC
  Administered 2016-08-15 – 2016-08-16 (×2): 25 mg via ORAL
  Filled 2016-08-14 (×2): qty 1

## 2016-08-14 MED ORDER — LORAZEPAM 1 MG PO TABS: 1.0000 mg | ORAL_TABLET | Freq: Four times a day (QID) | ORAL | Status: DC | PRN

## 2016-08-14 MED ORDER — ONDANSETRON HCL 4 MG/2ML IJ SOLN: 4.0000 mg | Freq: Four times a day (QID) | INTRAMUSCULAR | Status: DC | PRN

## 2016-08-14 MED ORDER — ZOLPIDEM TARTRATE 5 MG PO TABS
5.0000 mg | ORAL_TABLET | Freq: Every evening | ORAL | Status: DC | PRN
  Administered 2016-08-14 – 2016-08-15 (×2): 5 mg via ORAL
  Filled 2016-08-14 (×2): qty 1

## 2016-08-14 MED ORDER — RIVAROXABAN 20 MG PO TABS
20.0000 mg | ORAL_TABLET | Freq: Every day | ORAL | Status: DC
  Administered 2016-08-15: 20 mg via ORAL
  Filled 2016-08-14: qty 1

## 2016-08-14 MED ORDER — ADULT MULTIVITAMIN W/MINERALS CH
1.0000 | ORAL_TABLET | Freq: Every day | ORAL | Status: DC
  Administered 2016-08-14 – 2016-08-15 (×2): 1 via ORAL
  Filled 2016-08-14 (×2): qty 1

## 2016-08-14 MED ORDER — POTASSIUM CHLORIDE CRYS ER 20 MEQ PO TBCR
40.0000 meq | EXTENDED_RELEASE_TABLET | Freq: Once | ORAL | Status: AC
  Administered 2016-08-14: 40 meq via ORAL
  Filled 2016-08-14: qty 2

## 2016-08-14 MED ORDER — LORAZEPAM 2 MG/ML IJ SOLN
0.0000 mg | Freq: Two times a day (BID) | INTRAMUSCULAR | Status: DC
Start: 2016-08-16 — End: 2016-08-16

## 2016-08-14 MED ORDER — SODIUM CHLORIDE 0.9 % IV SOLN: 250.0000 mL | INTRAVENOUS | Status: DC | PRN

## 2016-08-14 MED ORDER — ACETAMINOPHEN 325 MG PO TABS: 650.0000 mg | ORAL_TABLET | ORAL | Status: DC | PRN

## 2016-08-14 MED ORDER — FOLIC ACID 1 MG PO TABS
1.0000 mg | ORAL_TABLET | Freq: Every day | ORAL | Status: DC
  Administered 2016-08-14 – 2016-08-15 (×2): 1 mg via ORAL
  Filled 2016-08-14 (×2): qty 1

## 2016-08-14 MED ORDER — MAGNESIUM SULFATE 2 GM/50ML IV SOLN
2.0000 g | Freq: Once | INTRAVENOUS | Status: AC
  Administered 2016-08-14: 2 g via INTRAVENOUS
  Filled 2016-08-14: qty 50

## 2016-08-14 MED ORDER — LORAZEPAM 2 MG/ML IJ SOLN: 2.0000 mg | INTRAMUSCULAR | Status: DC | PRN

## 2016-08-14 MED ORDER — THIAMINE HCL 100 MG/ML IJ SOLN: 100.0000 mg | Freq: Every day | INTRAMUSCULAR | Status: DC

## 2016-08-14 MED ORDER — VITAMIN B-1 100 MG PO TABS
100.0000 mg | ORAL_TABLET | Freq: Every day | ORAL | Status: DC
  Administered 2016-08-14 – 2016-08-15 (×2): 100 mg via ORAL
  Filled 2016-08-14 (×3): qty 1

## 2016-08-14 MED ORDER — LORAZEPAM 2 MG/ML IJ SOLN: 0.0000 mg | Freq: Four times a day (QID) | INTRAMUSCULAR | Status: DC

## 2016-08-14 MED ORDER — LORAZEPAM 2 MG/ML IJ SOLN: 1.0000 mg | Freq: Four times a day (QID) | INTRAMUSCULAR | Status: DC | PRN

## 2016-08-14 MED ORDER — LOSARTAN POTASSIUM 50 MG PO TABS
100.0000 mg | ORAL_TABLET | Freq: Every day | ORAL | Status: DC
  Administered 2016-08-15 – 2016-08-16 (×2): 100 mg via ORAL
  Filled 2016-08-14 (×2): qty 2

## 2016-08-14 MED ORDER — ENSURE ENLIVE PO LIQD
237.0000 mL | Freq: Two times a day (BID) | ORAL | Status: DC
  Administered 2016-08-14 – 2016-08-15 (×3): 237 mL via ORAL

## 2016-08-14 MED ORDER — DILTIAZEM HCL ER 240 MG PO CP24
240.0000 mg | ORAL_CAPSULE | Freq: Every day | ORAL | Status: DC
  Administered 2016-08-14 – 2016-08-15 (×2): 240 mg via ORAL
  Filled 2016-08-14 (×4): qty 1

## 2016-08-14 MED ORDER — SODIUM CHLORIDE 0.9% FLUSH
3.0000 mL | Freq: Two times a day (BID) | INTRAVENOUS | Status: DC
  Administered 2016-08-14 – 2016-08-15 (×2): 3 mL via INTRAVENOUS

## 2016-08-14 MED ORDER — SOTALOL HCL 120 MG PO TABS
120.0000 mg | ORAL_TABLET | Freq: Two times a day (BID) | ORAL | Status: DC
  Administered 2016-08-14 – 2016-08-16 (×4): 120 mg via ORAL
  Filled 2016-08-14 (×4): qty 1

## 2016-08-14 MED ORDER — NICOTINE 14 MG/24HR TD PT24
14.0000 mg | MEDICATED_PATCH | Freq: Every day | TRANSDERMAL | Status: DC
  Administered 2016-08-14 – 2016-08-15 (×2): 14 mg via TRANSDERMAL
  Filled 2016-08-14 (×2): qty 1

## 2016-08-14 MED ORDER — LOSARTAN POTASSIUM-HCTZ 100-25 MG PO TABS: 0.5000 | ORAL_TABLET | Freq: Every day | ORAL | Status: DC

## 2016-08-14 NOTE — H&P (Signed)
ELECTROPHYSIOLOGY HISTORY AND PHYSICAL    Patient ID: Travis Conrad MRN: [id number], DOB/AGE: 07-10-1950 66 y.o.  Admit date: 08/14/2016 Date of Consult: 08/14/2016  Primary Physician: [name], MD Primary Cardiologist: [name]  CC: here for Sotalol load  HPI:  Travis Conrad is a 66 y.o. male with a past medical history significant for paroxysmal atrial fibrillation, hypertension, and alcohol abuse. He has had AF for the past 4-5 years. He was seen by Dr [name] in the office recently and due to increasing symptoms was planned for Sotalol admission today. He reports compliance with Xarelto for the last 4 weeks without missed doses.  He denies chest pain, shortness of breath, LE edema, recent fevers, chills, nausea or vomiting. He drinks a pint of whiskey nightly and is unsure of how he Katori Wirsing react without ETOH as he has never been without before.  He is not aware that he is currently in AF  Last echo 2015 demonstrated EF 55-60%, no RWMA, LA 38  Past Medical History:  Diagnosis Date  . Atrial fibrillation ([place])   . High cholesterol    "RX made groin hurt" (08/14/2016)  . Hypertension      Surgical History:  Past Surgical History:  Procedure Laterality Date  . ANKLE FRACTURE SURGERY Left ~ 2012   "put rod and pins in"  . COLONOSCOPY W/ BIOPSIES AND POLYPECTOMY  ~ 2000  . FRACTURE SURGERY    . ORIF FIBULA FRACTURE  06/24/2012   Procedure: OPEN REDUCTION INTERNAL FIXATION (ORIF) FIBULA FRACTURE;  Surgeon: [name], MD;  Location: [facility];  Service: Orthopedics;  Laterality: Left;  ORIF LEFT FIBULA,  POSSIBLE REPAIR OF SYNDESMOSIS      Prescriptions Prior to Admission  Medication Sig Dispense Refill Last Dose  . diltiazem (DILACOR XR) 240 MG 24 hr capsule Take 1 capsule (240 mg total) by mouth daily with supper. 90 capsule 3 Taking  . losartan-hydrochlorothiazide (HYZAAR) 100-25 MG tablet Take 0.5 tablets by mouth daily.     Alveda Reasons 20 MG TABS tablet TAKE 1 TABLET(20 MG) BY  MOUTH DAILY WITH DINNER 30 tablet 6 Taking    Inpatient Medications:  . feeding supplement (ENSURE ENLIVE)  237 mL Oral BID BM    Allergies: No Known Allergies  Social History   Social History  . Marital status: Married    Spouse name: N/A  . Number of children: N/A  . Years of education: N/A   Occupational History  . Not on file.   Social History Main Topics  . Smoking status: Current Every Day Smoker    Packs/day: 1.00    Years: 50.00    Types: Cigarettes  . Smokeless tobacco: Never Used  . Alcohol use 45.0 oz/week    75 Shots of liquor per week     Comment: 08/14/2016 "1 pint whiskey /day; last drink was yesterday"  . Drug use: Yes    Types: Cocaine, Marijuana     Comment: 08/14/2016 "last cocaine was in early 1990s; smoked pot yesterday"  . Sexual activity: Not Currently   Other Topics Concern  . Not on file   Social History Narrative  . No narrative on file     Family History  Problem Relation Age of Onset  . Other Brother      Review of Systems: All other systems reviewed and are otherwise negative except as noted above.  Physical Exam: Vitals:   08/14/16 1341  BP: (!) 131/108  Resp: 18  Temp: 98.2  F (36.8 C)  TempSrc: Oral  SpO2: 96%  Weight: 187 lb 8 oz (85 kg)  Height: 6' (1.829 m)    GEN- The patient is well appearing, alert and oriented x 3 today.   HEENT: normocephalic, atraumatic; sclera clear, conjunctiva pink; hearing intact; oropharynx clear; neck supple  Lungs- Clear to ausculation bilaterally, normal work of breathing.  No wheezes, rales, rhonchi Heart- Tachycardic irregular rate and rhythm  GI- soft, non-tender, non-distended, bowel sounds present  Extremities- no clubbing, cyanosis, or edema  MS- no significant deformity or atrophy Skin- warm and dry, no rash or lesion Psych- euthymic mood, full affect Neuro- strength and sensation are intact  Labs:   Lab Results  Component Value Date   WBC 7.7 03/03/2016   HGB 18.7 (H)  03/03/2016   HCT 54.2 (H) 03/03/2016   MCV 106.0 (H) 03/03/2016   PLT 204 03/03/2016    Recent Labs Lab 08/14/16 1151  NA 136  K 3.8  CL 101  CO2 26  BUN 10  CREATININE 1.14  CALCIUM 9.6  GLUCOSE 150*      Radiology/Studies: No results found.  EKG: AF, v rate 111, QTc 469msec   TELEMETRY: AF with RVR, V rates 140's  Assessment/Plan: 1.  Atrial fibrillation with RVR Intermittently symptomatic Plan per Dr [name] to start Sotalol this admission Jacqueline Delapena start Sotalol 120mg  twice daily today QTc ok Eowyn Tabone supplement K and Mg - do NOT hold Sotalol tonight for electrolytes He would also benefit from lifestyle modifications - encouraged to stop ETOH and have sleep study as an outpatient Continue Xarelto long term for [facility] of 2 Update echo this admission  If still in AF on Wednesday, Saharsh Sterling need DCCV   2.  ETOH abuse He drinks a pint of whiskey nightly Complete cessation advised CIWA protocol while admitted  3.  HTN Stable No change required today  4.  Sleep disordered breathing Would benefit from outpatient sleep study    Signed, Chanetta Marshall, NP 08/14/2016 3:21 PM   I have seen and examined this patient with Chanetta Marshall.  Agree with above, note added to reflect my findings.  On exam, tachycardic, no murmurs, lungs clear. Presenting to the hospital for sotalol loading for atrial fibrillation. Plan to replete labs for K and Mg and get 120 mg tonight. Discussed heavy EtOH abuse and relation to AF. Werner Labella place on CIWA. If remains in AF, Braylin Formby plan for cardioversion after 4th dose.    Dimarco Minkin M. Anushree Dorsi MD 08/14/2016 3:47 PM

## 2016-08-14 NOTE — Progress Notes (Signed)
Patient ID: Travis Conrad                 DOB: 03-Oct-1950                    MRN: [id number]     HPI: Travis Conrad is a 66 y.o. male patient of [name] who presents today for sotalol initiation. PMH is significant for PAF and HTN. Pt symptomatic with frequent heart racing that feels worse with stress and can last hours. Given evidence of CAD on CT scan, Dr [name] discussed with EP and recommended sotalol admission.  Pt educated on potential risks with sotalol including QTc prolongation. Pt is aware of the importance of not missing any doses and will call the office if they miss more than 2 doses in a row. Pt is anticoagulated with Xarelto and reports no missed doses within the past month. Pt is currently not taking any QTc prolongating or contraindicated medications.   EKG: Reviewed by Dr Rayann Heman. Afib with RVR. Vent rate 111bpm, QTc 468msec.  Labs: K 3.8, Mg 1.7, SCr 1.14, CrCl 14mL/min, 187  lbs  Past Medical History:  Diagnosis Date  . Atrial fibrillation ([place])   . Hypertension     Current Outpatient Prescriptions on File Prior to Visit  Medication Sig Dispense Refill  . diltiazem (DILACOR XR) 240 MG 24 hr capsule Take 1 capsule (240 mg total) by mouth daily with supper. 90 capsule 3  . losartan-hydrochlorothiazide (HYZAAR) 100-25 MG tablet Take 1 tablet by mouth daily. (Patient taking differently: Take 1 tablet by mouth daily. Take 1/2 tablet daily) 90 tablet 3  . XARELTO 20 MG TABS tablet TAKE 1 TABLET(20 MG) BY MOUTH DAILY WITH DINNER 30 tablet 6   No current facility-administered medications on file prior to visit.     No Known Allergies  Assessment/Plan:  1. Atrial fibrillation - QTc < 443msec ok for sotalol admit. K low at 3.8 and Mg low at 1.7. Called pt to discuss supplementation and he was already almost at the hospital since a bed opened up earlier than expected. Will need electrolyte repletion once there.   Megan E. Supple, PharmD, CPP, [place] [id number], [place], [place] [address] Phone: [phone]; Fax: [phone] 08/14/2016 1:13 PM

## 2016-08-15 ENCOUNTER — Inpatient Hospital Stay (HOSPITAL_COMMUNITY): Payer: Medicare Other

## 2016-08-15 DIAGNOSIS — I4891 Unspecified atrial fibrillation: Secondary | ICD-10-CM

## 2016-08-15 LAB — BASIC METABOLIC PANEL
ANION GAP: 10 (ref 5–15)
BUN: 10 mg/dL (ref 6–20)
CHLORIDE: 102 mmol/L (ref 101–111)
CO2: 26 mmol/L (ref 22–32)
CREATININE: 1.09 mg/dL (ref 0.61–1.24)
Calcium: 9.4 mg/dL (ref 8.9–10.3)
GFR calc non Af Amer: >60 mL/min (ref >60)
Glucose, Bld: 111 mg/dL — ABNORMAL HIGH (ref 65–99)
Potassium: 3.9 mmol/L (ref 3.5–5.1)
Sodium: 138 mmol/L (ref 135–145)

## 2016-08-15 LAB — ECHOCARDIOGRAM COMPLETE
Height: 72 in
WEIGHTICAEL: 2939.2 [oz_av]

## 2016-08-15 LAB — MAGNESIUM: MAGNESIUM: 2.1 mg/dL (ref 1.7–2.4)

## 2016-08-15 MED ORDER — RIVAROXABAN 20 MG PO TABS
20.0000 mg | ORAL_TABLET | Freq: Every day | ORAL | Status: DC
  Administered 2016-08-16: 20 mg via ORAL
  Filled 2016-08-15: qty 1

## 2016-08-15 MED ORDER — OFF THE BEAT BOOK
Freq: Once | Status: AC
  Administered 2016-08-15: 17:00:00
  Filled 2016-08-15: qty 1

## 2016-08-15 MED ORDER — RIVAROXABAN 20 MG PO TABS: 20.0000 mg | ORAL_TABLET | Freq: Every day | ORAL | Status: DC

## 2016-08-15 NOTE — Discharge Summary (Signed)
ELECTROPHYSIOLOGY PROCEDURE DISCHARGE SUMMARY    Patient ID: Travis Conrad,  MRN: [id number], DOB/AGE: Jun 02, 1951 66 y.o.  Admit date: 08/14/2016 Discharge date: 08/16/2016  Primary Care Physician: [name], MD Primary Cardiologist: [name] Electrophysiologist: Lovena Le  Primary Discharge Diagnosis:  1.  Paroxysmal atrial fibrillation status post Sotalol loading this admission  Secondary Discharge Diagnosis:  1.  ETOH abuse 2.  HTN  No Known Allergies   Procedures This Admission:  1.  Sotalol loading 2.  Echocardiogram demonstrated normal LV function with severe LA enlargement.   Brief HPI: Travis Conrad is a 66 y.o. male with a past medical history as noted above.  He has had increased AF burden recently and was seen by Dr [name] with plans for admission for Sotalol load.  Risks, benefits, and alternatives to Sotalol were reviewed with the patient who wished to proceed.    Hospital Course:  The patient was admitted and Sotalol was initiated.  Renal function and electrolytes were followed during the hospitalization.  Their QTc remained stable.   They were monitored until discharge on telemetry which demonstrated sinus rhythm.  On the day of discharge, they were examined by Dr Lovena Le who considered them stable for discharge to home.  Follow-up has been arranged with AF clinic in 1 week and with Dr Lovena Le in 4 weeks.   Complete ETOH cessation advised. He also has sleep disordered breathing and would potentially benefit from sleep study as an outpatient.   Physical Exam: Vitals:   08/15/16 0900 08/15/16 1643 08/15/16 2004 08/16/16 0445  BP: (!) 126/92 124/83 118/76 107/70  Pulse:   66 60  Resp:      Temp:  98.1 F (36.7 C) 97.9 F (36.6 C) 98 F (36.7 C)  TempSrc:  Oral Oral Oral  SpO2:  96% 96% 98%  Weight:    185 lb 3.2 oz (84 kg)  Height:        GEN- The patient is well appearing, alert and oriented x 3 today.   HEENT: normocephalic, atraumatic; sclera clear,  conjunctiva pink; hearing intact; oropharynx clear; neck supple  Lungs- Clear to ausculation bilaterally, normal work of breathing.  No wheezes, rales, rhonchi Heart- Regular rate and rhythm  GI- soft, non-tender, non-distended, bowel sounds present  Extremities- no clubbing, cyanosis, or edema  MS- no significant deformity or atrophy Skin- warm and dry, no rash or lesion Psych- euthymic mood, full affect Neuro- strength and sensation are intact   Labs:   Lab Results  Component Value Date   WBC 7.7 03/03/2016   HGB 18.7 (H) 03/03/2016   HCT 54.2 (H) 03/03/2016   MCV 106.0 (H) 03/03/2016   PLT 204 03/03/2016     Recent Labs Lab 08/15/16 0446  NA 138  K 3.9  CL 102  CO2 26  BUN 10  CREATININE 1.09  CALCIUM 9.4  GLUCOSE 111*     Discharge Medications:  Allergies as of 08/16/2016   No Known Allergies     Medication List    TAKE these medications   diltiazem 240 MG 24 hr capsule Commonly known as:  DILACOR XR Take 1 capsule (240 mg total) by mouth daily with supper.   losartan-hydrochlorothiazide 100-25 MG tablet Commonly known as:  HYZAAR Take 0.5 tablets by mouth daily.   sotalol 120 MG tablet Commonly known as:  BETAPACE Take 1 tablet (120 mg total) by mouth every 12 (twelve) hours.   Vitamin D3 5000 units Caps Take 5,000 Units by mouth  daily.   XARELTO 20 MG Tabs tablet Generic drug:  rivaroxaban TAKE 1 TABLET(20 MG) BY MOUTH DAILY WITH DINNER What changed:  See the new instructions.       Disposition:  Discharge Instructions    Diet - low sodium heart healthy    Complete by:  As directed    Increase activity slowly    Complete by:  As directed      Follow-up Information    [facility] Follow up on 08/22/2016.   Specialty:  Cardiology Why:  at 10:30AM  Contact information: [address] [id number] [place]       Cristopher Peru, MD Follow up on 09/21/2016.     Specialty:  Cardiology Why:  at [place] information: [place] [address] [place] [address] [phone]           Duration of Discharge Encounter: Greater than 30 minutes including physician time.  Signed, Chanetta Marshall, NP 08/16/2016 6:48 AM  EP Attending  Patient seen and examined. He is stable for DC home. He has a RRR and clear lungs and normal neuro exam and no edema. Tele demonstrates NSR with a normal QT. He will follow up as noted above. ETOH cessation has been recommended.  Mikle Bosworth.D.

## 2016-08-15 NOTE — Progress Notes (Signed)
  Echocardiogram 2D Echocardiogram has been performed.  Donata Clay 08/15/2016, 12:16 PM

## 2016-08-15 NOTE — Progress Notes (Addendum)
Nutrition Brief Note  Patient identified on the Malnutrition Screening Tool (MST) Report for 5-10 lb weight loss within the past year. No significant weight changes noted.   Wt Readings from Last 15 Encounters:  08/15/16 183 lb 11.2 oz (83.3 kg)  07/24/16 190 lb 1.9 oz (86.2 kg)  03/10/16 179 lb 6.4 oz (81.4 kg)  12/20/15 182 lb 12.8 oz (82.9 kg)  12/03/15 182 lb 12.8 oz (82.9 kg)  11/04/15 183 lb 1.9 oz (83.1 kg)  05/28/15 189 lb (85.7 kg)  10/12/14 183 lb 9.6 oz (83.3 kg)  04/10/14 189 lb (85.7 kg)  09/08/13 195 lb (88.5 kg)  07/25/13 196 lb (88.9 kg)  07/12/13 196 lb (88.9 kg)  06/24/12 184 lb 7 oz (83.7 kg)    Body mass index is 24.91 kg/m. Patient meets criteria for normal weight based on current BMI.   Current diet order is heart healthy, patient is consuming approximately 100% of meals at this time. Labs and medications reviewed.   No nutrition interventions warranted at this time. If nutrition issues arise, please consult RD.   Molli Barrows, RD, LDN, [place] Pager [phone] After Hours Pager [phone]

## 2016-08-15 NOTE — Progress Notes (Signed)
SUBJECTIVE: The patient is doing well today.  At this time, he denies chest pain, shortness of breath, or any new concerns.  CURRENT MEDICATIONS: . diltiazem  240 mg Oral Q supper  . feeding supplement (ENSURE ENLIVE)  237 mL Oral BID BM  . folic acid  1 mg Oral Daily  . losartan  100 mg Oral Daily   And  . hydrochlorothiazide  25 mg Oral Daily  . LORazepam  0-4 mg Intravenous Q6H   Followed by  . [START ON 08/16/2016] LORazepam  0-4 mg Intravenous Q12H  . multivitamin with minerals  1 tablet Oral Daily  . nicotine  14 mg Transdermal Daily  . rivaroxaban  20 mg Oral Daily  . sodium chloride flush  3 mL Intravenous Q12H  . sotalol  120 mg Oral Q12H  . thiamine  100 mg Oral Daily   Or  . thiamine  100 mg Intravenous Daily     OBJECTIVE: Physical Exam: Vitals:   08/14/16 1341 08/14/16 1626 08/14/16 1945 08/15/16 0529  BP: (!) 131/108 (!) 139/91 (!) 121/102 (!) 120/93  Pulse:   80 62  Resp: 18  20 19   Temp: 98.2 F (36.8 C)  98.1 F (36.7 C) 98.1 F (36.7 C)  TempSrc: Oral  Oral Oral  SpO2: 96%  98% 97%  Weight: 187 lb 8 oz (85 kg)   183 lb 11.2 oz (83.3 kg)  Height: 6' (1.829 m)       Intake/Output Summary (Last 24 hours) at 08/15/16 [id number] Last data filed at 08/15/16 0600  Gross per 24 hour  Intake              125 ml  Output                0 ml  Net              125 ml    Telemetry reveals sinus rhythm  GEN- The patient is well appearing, alert and oriented x 3 today.   Head- normocephalic, atraumatic Eyes-  Sclera clear, conjunctiva pink Ears- hearing intact Oropharynx- clear Neck- supple Lungs- Clear to ausculation bilaterally, normal work of breathing Heart- Regular rate and rhythm, no murmurs, rubs or gallops GI- soft, NT, ND, + BS Extremities- no clubbing, cyanosis, or edema Skin- no rash or lesion Psych- euthymic mood, full affect Neuro- strength and sensation are intact  LABS: Basic Metabolic Panel:  Recent Labs  08/14/16 1151  08/15/16 0446  NA 136 138  K 3.8 3.9  CL 101 102  CO2 26 26  GLUCOSE 150* 111*  BUN 10 10  CREATININE 1.14 1.09  CALCIUM 9.6 9.4  MG 1.7 2.1    ASSESSMENT AND PLAN:  Active Problems:   Paroxysmal atrial fibrillation ([facility])  1. Paroxysmal atrial fibrillation with RVR Intermittently symptomatic Plan per Dr [name] to start Sotalol this admission Continue Sotalol 120mg  twice daily  QTc ok, BMET, Mg stable  He would benefit from lifestyle modifications - encouraged to stop ETOH and have sleep study as an outpatient Continue Xarelto long term for [facility] of 2 Update echo this admission - ordered  2.  ETOH abuse He drinks a pint of whiskey nightly Complete cessation advised CIWA protocol while admitted  3.  HTN Stable No change required today  4.  Sleep disordered breathing Would benefit from outpatient sleep study  Will arrange   If QTc remains stable today and pt remains in SR, potentially discharge tomorrow  Chanetta Marshall,  NP 08/15/2016 7:23 AM  EP Attending  Patient seen and examined. He has reverted back to NSR. His QT is ok. His exam reveals a RRR and clear lungs. No evidence of DT's. We will dc home tomorrow if his ECG is ok. I discussed importance of reducing ETOH consumption.  Mikle Bosworth.D.

## 2016-08-15 NOTE — Plan of Care (Signed)
Problem: Activity: Goal: Ability to tolerate increased activity will improve Outcome: Progressing Ambulating in room and hallways

## 2016-08-16 MED ORDER — SOTALOL HCL 120 MG PO TABS: 120.0000 mg | ORAL_TABLET | Freq: Two times a day (BID) | ORAL | 2 refills | Status: DC

## 2016-08-16 NOTE — Plan of Care (Signed)
Problem: Activity: Goal: Ability to tolerate increased activity will improve Outcome: Completed/Met Date Met: 08/16/16 Pt ambulating in room without difficulty, given Ambien for sleep.

## 2016-08-22 ENCOUNTER — Ambulatory Visit (HOSPITAL_COMMUNITY)
Admission: RE | Admit: 2016-08-22 | Discharge: 2016-08-22 | Disposition: A | Discharge status: Home / Self Care | Payer: Medicare Other | Source: Ambulatory Visit | Attending: Nurse Practitioner | Admitting: Nurse Practitioner

## 2016-08-22 ENCOUNTER — Encounter (HOSPITAL_COMMUNITY): Payer: Self-pay | Admitting: Nurse Practitioner

## 2016-08-22 VITALS — BP 132/84 | HR 50 | Ht 72.0 in | Wt 187.6 lb

## 2016-08-22 DIAGNOSIS — E7800 Pure hypercholesterolemia, unspecified: Secondary | ICD-10-CM | POA: Diagnosis not present

## 2016-08-22 DIAGNOSIS — F17210 Nicotine dependence, cigarettes, uncomplicated: Secondary | ICD-10-CM | POA: Insufficient documentation

## 2016-08-22 DIAGNOSIS — I10 Essential (primary) hypertension: Secondary | ICD-10-CM | POA: Diagnosis not present

## 2016-08-22 DIAGNOSIS — I480 Paroxysmal atrial fibrillation: Secondary | ICD-10-CM | POA: Diagnosis present

## 2016-08-22 DIAGNOSIS — Z7901 Long term (current) use of anticoagulants: Secondary | ICD-10-CM | POA: Diagnosis not present

## 2016-08-22 LAB — BASIC METABOLIC PANEL
Anion gap: 9 (ref 5–15)
BUN: 11 mg/dL (ref 6–20)
CHLORIDE: 100 mmol/L — AB (ref 101–111)
CO2: 28 mmol/L (ref 22–32)
CREATININE: 1.17 mg/dL (ref 0.61–1.24)
Calcium: 9.6 mg/dL (ref 8.9–10.3)
GFR calc Af Amer: >60 mL/min (ref >60)
GFR calc non Af Amer: >60 mL/min (ref >60)
GLUCOSE: 113 mg/dL — AB (ref 65–99)
Potassium: 4 mmol/L (ref 3.5–5.1)
Sodium: 137 mmol/L (ref 135–145)

## 2016-08-22 LAB — MAGNESIUM: Magnesium: 1.8 mg/dL (ref 1.7–2.4)

## 2016-08-22 NOTE — Progress Notes (Signed)
Primary Care Physician: [name], MD Referring Physician: [facility] f/u, Dr. Lovena Le Cardiologist: Dr. Amadeo Garnet Travis Conrad is a 66 y.o. male with a h/o paroxymal afib that is in the afib clinic one week s/p hospitalization for sotalol load. Pt has not noted any further afib. He does continue to smoke and drink alcohol more than 2 drinks a week. He is aware that these issues can be triggers for afib. He is aware of risk associated with qtc prolonging drugs and precaution not to take with other qtc prolonging drugs. He has no concerns today.  Today, he denies symptoms of palpitations, chest pain, shortness of breath, orthopnea, PND, lower extremity edema, dizziness, presyncope, syncope, or neurologic sequela. The patient is tolerating medications without difficulties and is otherwise without complaint today.   Past Medical History:  Diagnosis Date  . Atrial fibrillation ([place])   . High cholesterol    "RX made groin hurt" (08/14/2016)  . Hypertension    Past Surgical History:  Procedure Laterality Date  . ANKLE FRACTURE SURGERY Left ~ 2012   "put rod and pins in"  . COLONOSCOPY W/ BIOPSIES AND POLYPECTOMY  ~ 2000  . FRACTURE SURGERY    . ORIF FIBULA FRACTURE  06/24/2012   Procedure: OPEN REDUCTION INTERNAL FIXATION (ORIF) FIBULA FRACTURE;  Surgeon: [name], MD;  Location: [facility];  Service: Orthopedics;  Laterality: Left;  ORIF LEFT FIBULA,  POSSIBLE REPAIR OF SYNDESMOSIS     Current Outpatient Prescriptions  Medication Sig Dispense Refill  . Cholecalciferol (VITAMIN D3) 5000 units CAPS Take 5,000 Units by mouth daily.    [name] diltiazem (DILACOR XR) 240 MG 24 hr capsule Take 1 capsule (240 mg total) by mouth daily with supper. 90 capsule 3  . losartan-hydrochlorothiazide (HYZAAR) 100-25 MG tablet Take 0.5 tablets by mouth daily.    . sotalol (BETAPACE) 120 MG tablet Take 1 tablet (120 mg total) by mouth every 12 (twelve) hours. 60 tablet 2  . XARELTO 20 MG TABS tablet TAKE 1 TABLET(20 MG)  BY MOUTH DAILY WITH DINNER (Patient taking differently: Take 20 mg by mouth in the morning) 30 tablet 6   No current facility-administered medications for this encounter.     No Known Allergies  Social History   Social History  . Marital status: Married    Spouse name: N/A  . Number of children: N/A  . Years of education: N/A   Occupational History  . Not on file.   Social History Main Topics  . Smoking status: Current Every Day Smoker    Packs/day: 1.00    Years: 50.00    Types: Cigarettes  . Smokeless tobacco: Never Used  . Alcohol use 45.0 oz/week    75 Shots of liquor per week     Comment: 08/14/2016 "1 pint whiskey /day; last drink was yesterday"  . Drug use: Yes    Types: Cocaine, Marijuana     Comment: 08/14/2016 "last cocaine was in early 1990s; smoked pot yesterday"  . Sexual activity: Not Currently   Other Topics Concern  . Not on file   Social History Narrative  . No narrative on file    Family History  Problem Relation Age of Onset  . Other Brother     ROS- All systems are reviewed and negative except as per the HPI above  Physical Exam: Vitals:   08/22/16 0959  BP: 132/84  Pulse: (!) 50  Weight: 187 lb 9.6 oz (85.1 kg)  Height: 6' (1.829  m)   Wt Readings from Last 3 Encounters:  08/22/16 187 lb 9.6 oz (85.1 kg)  08/16/16 185 lb 3.2 oz (84 kg)  07/24/16 190 lb 1.9 oz (86.2 kg)    Labs: Lab Results  Component Value Date   NA 137 08/22/2016   K 4.0 08/22/2016   CL 100 (L) 08/22/2016   CO2 28 08/22/2016   GLUCOSE 113 (H) 08/22/2016   BUN 11 08/22/2016   CREATININE 1.17 08/22/2016   CALCIUM 9.6 08/22/2016   MG 1.8 08/22/2016   Lab Results  Component Value Date   INR 0.93 07/12/2013   Lab Results  Component Value Date   CHOL 212 (H) 06/04/2015   HDL 43 06/04/2015   LDLCALC 136 (H) 06/04/2015   TRIG 167 (H) 06/04/2015     GEN- The patient is well appearing, alert and oriented x 3 today.   Head- normocephalic, atraumatic Eyes-   Sclera clear, conjunctiva pink Ears- hearing intact Oropharynx- clear Neck- supple, no JVP Lymph- no cervical lymphadenopathy Lungs- Clear to ausculation bilaterally, normal work of breathing Heart- Regular rate and rhythm, no murmurs, rubs or gallops, PMI not laterally displaced GI- soft, NT, ND, + BS Extremities- no clubbing, cyanosis, or edema MS- no significant deformity or atrophy Skin- no rash or lesion Psych- euthymic mood, full affect Neuro- strength and sensation are intact  EKG-sinus brady at 50, pr int 160 ms, qrs int 84 ms, qtc 454 ms(stable) Epic records reviewed    Assessment and Plan: 1. Paroxysmal symptomatic afib Maintaining SR with sotalol Continue 120 mg bid Precautions with sotalol discussed Continue xarelto Bmet/mag today  2.Lifestyle contributors Discussed limiting alcohol and smoking contribution to afib Pt is not interested in modifying lifestyle at this time  3. HTN Stable  F/u with Dr. Lovena Le as scheduled 3/23   [name]. Terica Yogi, [facility] [address] [place], [place] [address] [phone]

## 2016-09-01 ENCOUNTER — Ambulatory Visit (HOSPITAL_COMMUNITY)
Admission: RE | Admit: 2016-09-01 | Discharge: 2016-09-01 | Disposition: A | Discharge status: Home / Self Care | Payer: Medicare Other | Source: Ambulatory Visit | Attending: Nurse Practitioner | Admitting: Nurse Practitioner

## 2016-09-01 DIAGNOSIS — I4891 Unspecified atrial fibrillation: Secondary | ICD-10-CM | POA: Insufficient documentation

## 2016-09-01 LAB — MAGNESIUM: Magnesium: 1.9 mg/dL (ref 1.7–2.4)

## 2016-09-08 ENCOUNTER — Other Ambulatory Visit (HOSPITAL_BASED_OUTPATIENT_CLINIC_OR_DEPARTMENT_OTHER): Payer: Medicare Other

## 2016-09-08 ENCOUNTER — Other Ambulatory Visit: Payer: Self-pay | Admitting: Nurse Practitioner

## 2016-09-08 ENCOUNTER — Ambulatory Visit (HOSPITAL_BASED_OUTPATIENT_CLINIC_OR_DEPARTMENT_OTHER): Payer: Medicare Other | Admitting: Oncology

## 2016-09-08 VITALS — BP 120/71 | HR 62 | Temp 97.9°F | Resp 18 | Wt 186.4 lb

## 2016-09-08 DIAGNOSIS — R634 Abnormal weight loss: Secondary | ICD-10-CM | POA: Diagnosis not present

## 2016-09-08 DIAGNOSIS — Z720 Tobacco use: Secondary | ICD-10-CM

## 2016-09-08 DIAGNOSIS — R1013 Epigastric pain: Secondary | ICD-10-CM

## 2016-09-08 DIAGNOSIS — R0789 Other chest pain: Secondary | ICD-10-CM

## 2016-09-08 DIAGNOSIS — D751 Secondary polycythemia: Secondary | ICD-10-CM

## 2016-09-08 LAB — CBC WITH DIFFERENTIAL/PLATELET
BASO%: 0.3 % (ref 0.0–2.0)
Basophils Absolute: 0 10*3/uL (ref 0.0–0.1)
EOS%: 1.7 % (ref 0.0–7.0)
Eosinophils Absolute: 0.2 10*3/uL (ref 0.0–0.5)
HCT: 50.4 % — ABNORMAL HIGH (ref 38.4–49.9)
HGB: 18.2 g/dL — ABNORMAL HIGH (ref 13.0–17.1)
LYMPH%: 31.3 % (ref 14.0–49.0)
MCH: 37.1 pg — ABNORMAL HIGH (ref 27.2–33.4)
MCHC: 36.1 g/dL — AB (ref 32.0–36.0)
MCV: 102.6 fL — ABNORMAL HIGH (ref 79.3–98.0)
MONO#: 1 10*3/uL — AB (ref 0.1–0.9)
MONO%: 11.5 % (ref 0.0–14.0)
NEUT#: 4.7 10*3/uL (ref 1.5–6.5)
NEUT%: 55.2 % (ref 39.0–75.0)
PLATELETS: 209 10*3/uL (ref 140–400)
RBC: 4.91 10*6/uL (ref 4.20–5.82)
RDW: 13.3 % (ref 11.0–14.6)
WBC: 8.6 10*3/uL (ref 4.0–10.3)
lymph#: 2.7 10*3/uL (ref 0.9–3.3)

## 2016-09-08 NOTE — Progress Notes (Signed)
Hematology and Oncology Follow Up Visit  Travis Conrad [id number] 15-Jan-1951 66 y.o. 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 JAK2 mutation was not detected. These findings states back to 2015.   Current therapy: Evaluation for possible need for phlebotomy.  Interim History:   [name] presents today for a follow-up visit. Since the last visit, he was hospitalized in February 2018 for paroxysmal atrial fibrillation and chronically anticoagulated on Xarelto. He was treated with sotalol and was discharged without incident on 08/16/2016. Since his discharge, he has been doing reasonably well without any major complaints. He denied any headaches or blurry vision. He denied angina or hemoptysis. He denied any hematochezia or melena.   He does not report any headaches, blurry vision, syncope or seizures. He does not report any fevers, chills, sweats. He does not report any palpitation, orthopnea or leg edema. Does not report any cough, wheezing or hemoptysis. Does not report any nausea, vomiting, and does report early satiety. He does not report any frequency urgency or hesitancy. Does not report any skeletal complaints of arthralgias or myalgias. Remaining review of systems unremarkable.   Medications: I have reviewed the patient's current medications.  Current Outpatient Prescriptions  Medication Sig Dispense Refill  . Cholecalciferol (VITAMIN D3) 5000 units CAPS Take 5,000 Units by mouth daily.    [name] diltiazem (DILACOR XR) 240 MG 24 hr capsule Take 1 capsule (240 mg total) by mouth daily with supper. 90 capsule 3  . losartan-hydrochlorothiazide (HYZAAR) 100-25 MG tablet Take 0.5 tablets by mouth daily.    . Magnesium 250 MG TABS Take 1 tablet by mouth daily.    . sotalol (BETAPACE) 120 MG tablet Take 1 tablet (120 mg total) by mouth every 12 (twelve) hours. 60 tablet 2  .  XARELTO 20 MG TABS tablet TAKE 1 TABLET(20 MG) BY MOUTH DAILY WITH DINNER (Patient taking differently: Take 20 mg by mouth in the morning) 30 tablet 6   No current facility-administered medications for this visit.      Allergies: No Known Allergies  Past Medical History, Surgical history, Social history, and Family History were reviewed and updated.   Physical Exam: Blood pressure 120/71, pulse 62, temperature 97.9 F (36.6 C), temperature source Oral, resp. rate 18, weight 186 lb 6.4 oz (84.6 kg), SpO2 98 %. ECOG: 1 General appearance: alert and cooperative. Without distress. Head: Normocephalic, without obvious abnormality without oral ulcers or lesions. Neck: no adenopathy Lymph nodes: Cervical, supraclavicular, and axillary nodes normal. Heart:regular rate and rhythm, S1, S2 normal, no murmur, click, rub or gallop Lung:chest clear, no wheezing, rales, normal symmetric air entry.  Abdomin: soft, non-tender, without masses or organomegaly EXT:no erythema, induration, or nodules   Lab Results: Lab Results  Component Value Date   WBC 8.6 09/08/2016   HGB 18.2 (H) 09/08/2016   HCT 50.4 (H) 09/08/2016   MCV 102.6 (H) 09/08/2016   PLT 209 09/08/2016     Chemistry      Component Value Date/Time   NA 137 08/22/2016 1000   NA 136 08/14/2016 1151   NA 142 03/03/2016 0921   K 4.0 08/22/2016 1000   K 4.3 03/03/2016 0921   CL 100 (L) 08/22/2016 1000   CO2 28 08/22/2016 1000   CO2 25 03/03/2016 0921   BUN 11 08/22/2016 1000   BUN 10 08/14/2016 1151   BUN 13.6 03/03/2016 0921   CREATININE 1.17 08/22/2016  1000   CREATININE 1.2 03/03/2016 0921      Component Value Date/Time   CALCIUM 9.6 08/22/2016 1000   CALCIUM 9.6 03/03/2016 0921   ALKPHOS 116 03/03/2016 0921   AST 36 (H) 03/03/2016 0921   ALT 31 03/03/2016 0921   BILITOT 0.71 03/03/2016 0921       Impression and Plan:  1. Polycythemia: Appears to be secondary in nature related to heavy tobacco use. Polycythemia  vera workup has been unrevealing. His hemoglobin is mildly elevated and is asymptomatic.  From a management standpoint, I have recommended observation only without any therapeutic phlebotomy. The utility for therapeutic phlebotomy in the setting of a secondary polycythemia is unclear less respiratory symptoms develop in the future.  I have counseled him about smoking cessation which will improve his polycythemia.  2. Weight loss: CT scan obtained in September 2017 did not show any evidence of malignancy. His weight has been stable since the last visit.  3. Thrombosis prophylaxis: His risk of thrombosis is low and he is on full dose anticoagulation with Xarelto.  4. Follow-up: Will be as needed in the future.     [facility], MD 3/23/20189:57 AM

## 2016-09-21 ENCOUNTER — Encounter: Payer: Self-pay | Admitting: *Deleted

## 2016-09-21 ENCOUNTER — Ambulatory Visit (INDEPENDENT_AMBULATORY_CARE_PROVIDER_SITE_OTHER): Payer: Medicare Other | Admitting: Internal Medicine

## 2016-09-21 ENCOUNTER — Encounter: Payer: Self-pay | Admitting: Internal Medicine

## 2016-09-21 VITALS — BP 128/80 | HR 59 | Ht 72.0 in | Wt 185.4 lb

## 2016-09-21 DIAGNOSIS — I480 Paroxysmal atrial fibrillation: Secondary | ICD-10-CM

## 2016-09-21 MED ORDER — SOTALOL HCL 120 MG PO TABS: ORAL_TABLET | ORAL | 1 refills | Status: DC

## 2016-09-21 NOTE — Patient Instructions (Signed)
Medication Instructions:   Your physician recommends that you continue on your current medications as directed. Please refer to the Current Medication list given to you today.  Labwork:  none  Testing/Procedures:  none  Follow-Up:  Your physician recommends that you schedule a follow-up appointment in: 3 months.  Any Other Special Instructions Will Be Listed Below (If Applicable).  If you need a refill on your cardiac medications before your next appointment, please call your pharmacy. 

## 2016-09-21 NOTE — Progress Notes (Signed)
HPI [name] returns today for followup. He is a pleasant 66 yo man with PAF, HTN, CAD (coronary calcifications), and was admitted for initiation of Sotalol. Since starting sotalol, he has had 3 short epeisodes of atrial fibrillation. He denies chest pain, sob, or syncope. He admits to drinking a pint of whiskey a day. No Known Allergies   Current Outpatient Prescriptions  Medication Sig Dispense Refill  . Cholecalciferol (VITAMIN D3) 5000 units CAPS Take 5,000 Units by mouth daily.    [name] diltiazem (DILACOR XR) 240 MG 24 hr capsule Take 1 capsule (240 mg total) by mouth daily with supper. 90 capsule 3  . losartan-hydrochlorothiazide (HYZAAR) 100-25 MG tablet Take 0.5 tablets by mouth daily.    . Magnesium 250 MG TABS Take 1 tablet by mouth daily.    . sotalol (BETAPACE) 120 MG tablet TAKE 1 TABLET(120 MG) BY MOUTH EVERY 12 HOURS 180 tablet 1  . XARELTO 20 MG TABS tablet TAKE 1 TABLET(20 MG) BY MOUTH DAILY WITH DINNER 30 tablet 6   No current facility-administered medications for this visit.      Past Medical History:  Diagnosis Date  . Atrial fibrillation ([place])   . High cholesterol    "RX made groin hurt" (08/14/2016)  . Hypertension     ROS:   All systems reviewed and negative except as noted in the HPI.   Past Surgical History:  Procedure Laterality Date  . ANKLE FRACTURE SURGERY Left ~ 2012   "put rod and pins in"  . COLONOSCOPY W/ BIOPSIES AND POLYPECTOMY  ~ 2000  . FRACTURE SURGERY    . ORIF FIBULA FRACTURE  06/24/2012   Procedure: OPEN REDUCTION INTERNAL FIXATION (ORIF) FIBULA FRACTURE;  Surgeon: [name], MD;  Location: [facility];  Service: Orthopedics;  Laterality: Left;  ORIF LEFT FIBULA,  POSSIBLE REPAIR OF SYNDESMOSIS      Family History  Problem Relation Age of Onset  . Other Brother      Social History   Social History  . Marital status: Married    Spouse name: N/A  . Number of children: N/A  . Years of education: N/A   Occupational  History  . Not on file.   Social History Main Topics  . Smoking status: Current Every Day Smoker    Packs/day: 1.00    Years: 50.00    Types: Cigarettes  . Smokeless tobacco: Never Used  . Alcohol use 45.0 oz/week    75 Shots of liquor per week     Comment: 08/14/2016 "1 pint whiskey /day; last drink was yesterday"  . Drug use: Yes    Types: Cocaine, Marijuana     Comment: 08/14/2016 "last cocaine was in early 1990s; smoked pot yesterday"  . Sexual activity: Not Currently   Other Topics Concern  . Not on file   Social History Narrative  . No narrative on file     BP 128/80   Pulse (!) 59   Ht 6' (1.829 m)   Wt 185 lb 6.4 oz (84.1 kg)   SpO2 96%   BMI 25.14 kg/m   Physical Exam:  Well appearing NAD HEENT: Unremarkable Neck:  No JVD, no thyromegally Lymphatics:  No adenopathy Back:  No CVA tenderness Lungs:  Clear with no wheezes HEART:  Regular rate rhythm, no murmurs, no rubs, no clicks Abd:  soft, positive bowel sounds, no organomegally, no rebound, no guarding Ext:  2 plus pulses, no edema, no cyanosis, no clubbing Skin:  No rashes no nodules Neuro:  CN II through XII intact, motor grossly intact  EKG  - sinus bradycardia with QT prolongation.  Assess/Plan: 1. PAF - he appears to mostly be maintaining NSR. I have asked him to continue his current meds. If he were to have worsening of his atrial fib, then we would uptitrate his meds and cut back his calcium channel blocker. 2. HTN - his blood pressure is well controlled.  3. ETOH abuse - I have asked him to reduce his ETOH consumption. 4. CAD - he denies anginal symptoms. Will follow.  Mikle Bosworth.D.

## 2016-11-19 ENCOUNTER — Emergency Department (HOSPITAL_COMMUNITY)
Admission: EM | Admit: 2016-11-19 | Discharge: 2016-11-19 | Disposition: A | Discharge status: Home / Self Care | Payer: Medicare Other | Attending: Emergency Medicine | Admitting: Emergency Medicine

## 2016-11-19 ENCOUNTER — Encounter (HOSPITAL_COMMUNITY): Payer: Self-pay

## 2016-11-19 ENCOUNTER — Emergency Department (HOSPITAL_COMMUNITY): Payer: Medicare Other

## 2016-11-19 DIAGNOSIS — K625 Hemorrhage of anus and rectum: Secondary | ICD-10-CM | POA: Insufficient documentation

## 2016-11-19 DIAGNOSIS — I10 Essential (primary) hypertension: Secondary | ICD-10-CM | POA: Diagnosis not present

## 2016-11-19 DIAGNOSIS — E7800 Pure hypercholesterolemia, unspecified: Secondary | ICD-10-CM | POA: Diagnosis not present

## 2016-11-19 DIAGNOSIS — Z79899 Other long term (current) drug therapy: Secondary | ICD-10-CM | POA: Insufficient documentation

## 2016-11-19 DIAGNOSIS — I482 Chronic atrial fibrillation, unspecified: Secondary | ICD-10-CM

## 2016-11-19 DIAGNOSIS — K5780 Diverticulitis of intestine, part unspecified, with perforation and abscess without bleeding: Secondary | ICD-10-CM

## 2016-11-19 DIAGNOSIS — I4891 Unspecified atrial fibrillation: Secondary | ICD-10-CM | POA: Diagnosis not present

## 2016-11-19 DIAGNOSIS — F17210 Nicotine dependence, cigarettes, uncomplicated: Secondary | ICD-10-CM | POA: Diagnosis not present

## 2016-11-19 DIAGNOSIS — K922 Gastrointestinal hemorrhage, unspecified: Secondary | ICD-10-CM | POA: Diagnosis not present

## 2016-11-19 DIAGNOSIS — K5793 Diverticulitis of intestine, part unspecified, without perforation or abscess with bleeding: Secondary | ICD-10-CM | POA: Diagnosis not present

## 2016-11-19 DIAGNOSIS — I480 Paroxysmal atrial fibrillation: Secondary | ICD-10-CM

## 2016-11-19 DIAGNOSIS — K5792 Diverticulitis of intestine, part unspecified, without perforation or abscess without bleeding: Secondary | ICD-10-CM

## 2016-11-19 HISTORY — DX: Diverticulitis of intestine, part unspecified, with perforation and abscess without bleeding: K57.80

## 2016-11-19 LAB — POC OCCULT BLOOD, ED: Fecal Occult Bld: POSITIVE — AB

## 2016-11-19 LAB — COMPREHENSIVE METABOLIC PANEL
ALBUMIN: 3.6 g/dL (ref 3.5–5.0)
ALT: 16 U/L — ABNORMAL LOW (ref 17–63)
ANION GAP: 11 (ref 5–15)
AST: 22 U/L (ref 15–41)
Alkaline Phosphatase: 85 U/L (ref 38–126)
BUN: 9 mg/dL (ref 6–20)
CHLORIDE: 101 mmol/L (ref 101–111)
CO2: 22 mmol/L (ref 22–32)
Calcium: 9.4 mg/dL (ref 8.9–10.3)
Creatinine, Ser: 0.85 mg/dL (ref 0.61–1.24)
GFR calc Af Amer: >60 mL/min (ref >60)
GFR calc non Af Amer: >60 mL/min (ref >60)
GLUCOSE: 103 mg/dL — AB (ref 65–99)
POTASSIUM: 3.7 mmol/L (ref 3.5–5.1)
SODIUM: 134 mmol/L — AB (ref 135–145)
Total Bilirubin: 0.9 mg/dL (ref 0.3–1.2)
Total Protein: 6.7 g/dL (ref 6.5–8.1)

## 2016-11-19 LAB — TYPE AND SCREEN
ABO/RH(D): A POS
Antibody Screen: NEGATIVE

## 2016-11-19 LAB — CBC
HEMATOCRIT: 50.2 % (ref 39.0–52.0)
Hemoglobin: 18.4 g/dL — ABNORMAL HIGH (ref 13.0–17.0)
MCH: 37.7 pg — ABNORMAL HIGH (ref 26.0–34.0)
MCHC: 36.7 g/dL — AB (ref 30.0–36.0)
MCV: 102.9 fL — AB (ref 78.0–100.0)
Platelets: 226 10*3/uL (ref 150–400)
RBC: 4.88 MIL/uL (ref 4.22–5.81)
RDW: 13.7 % (ref 11.5–15.5)
WBC: 8.7 10*3/uL (ref 4.0–10.5)

## 2016-11-19 LAB — ABO/RH: ABO/RH(D): A POS

## 2016-11-19 MED ORDER — CIPROFLOXACIN HCL 500 MG PO TABS: 500.0000 mg | ORAL_TABLET | Freq: Two times a day (BID) | ORAL | 0 refills | Status: DC

## 2016-11-19 MED ORDER — IOPAMIDOL (ISOVUE-300) INJECTION 61%
INTRAVENOUS | Status: AC
  Administered 2016-11-19: 100 mL
  Filled 2016-11-19: qty 100

## 2016-11-19 MED ORDER — CIPROFLOXACIN IN D5W 400 MG/200ML IV SOLN: 400.0000 mg | Freq: Once | INTRAVENOUS | Status: DC

## 2016-11-19 MED ORDER — METRONIDAZOLE 500 MG PO TABS: 500.0000 mg | ORAL_TABLET | Freq: Two times a day (BID) | ORAL | 0 refills | Status: DC

## 2016-11-19 MED ORDER — METRONIDAZOLE IN NACL 5-0.79 MG/ML-% IV SOLN: 500.0000 mg | Freq: Once | INTRAVENOUS | Status: DC

## 2016-11-19 NOTE — Discharge Instructions (Signed)
[place] Xarelto to night and follow-up closely with your primary doctor tomorrow. Return immediately for worsening bleeding, lightheadedness, persistent vomiting, worsening abdominal pain or for any concerns.

## 2016-11-19 NOTE — ED Notes (Signed)
Patient transported to CT 

## 2016-11-19 NOTE — ED Triage Notes (Signed)
Per Pt, Pt is coming from home with complaints of blood noted in his stool. Reports to be bright red and mucous like in character. Pt reports have no pain, but some intermittent discomfort at times.

## 2016-11-19 NOTE — H&P (Signed)
Consultation note   Travis Conrad NWG:[id number] DOB: Dec 19, 1950 DOA: 11/19/2016  PCP: [name], MD  Patient coming from: Home  I have personally briefly reviewed patient's old medical records in [place]  Chief Complaint: Rectal bleeding  HPI: Travis Conrad is a 66 y.o. male with medical history significant of atrial fibrillation on several toe, elevated cholesterol, and hypertension who presents to the ER with complaints of rectal bleeding. Patient had an episode yesterday of rectal bleeding and 2 brief episodes today. Reports it as oozing from his bottom. He did not fill the toilet with blood. He said some of the blood was bright red and he takes anticoagulation for his atrial fibrillation. Because of the bleeding yesterday he rightfully did not take his Xarelto yesterday. A CT scan was obtained today and showed a descending diverticulitis with multiple loops of diverticulosis. The patient has been tolerating by mouth intake well. Had any nausea or vomiting or fevers. He has had some minimal abdominal pain but nothing particularly severe. Abdominal pain is mild does not radiate and is associated with attempts at bowel movements. Nothing makes it better and nothing makes it worse.  ED Course: Patient underwent CT scan which showed a sending diverticulitis. He has been able to tolerate orals in the emergency department. His hemoglobin is 18. His white blood cell count is 8.7. His temperature is 97.3 degrees blood pressure is 106/75. His pulse is 56 and his vitals have been similar to this all day.    Review of Systems: As per HPI otherwise 10 point review of systems negative.   Review of Systems  Constitutional: Negative for chills and fever.  HENT: Negative for ear pain, hearing loss and tinnitus.   Eyes: Negative for blurred vision and double vision.  Respiratory: Negative for cough and shortness of breath.   Cardiovascular: Negative for chest pain, palpitations and  orthopnea.  Gastrointestinal: Negative for abdominal pain, heartburn and nausea.  Genitourinary: Negative for dysuria and urgency.  Musculoskeletal: Negative for myalgias and neck pain.  Skin: Negative for itching and rash.  Neurological: Negative for dizziness, tingling and headaches.  Endo/Heme/Allergies: Negative for environmental allergies and polydipsia.  Psychiatric/Behavioral: Negative for depression, substance abuse and suicidal ideas.   Past Medical History:  Diagnosis Date  . Atrial fibrillation ([place])   . High cholesterol    "RX made groin hurt" (08/14/2016)  . Hypertension     Past Surgical History:  Procedure Laterality Date  . ANKLE FRACTURE SURGERY Left ~ 2012   "put rod and pins in"  . COLONOSCOPY W/ BIOPSIES AND POLYPECTOMY  ~ 2000  . FRACTURE SURGERY    . ORIF FIBULA FRACTURE  06/24/2012   Procedure: OPEN REDUCTION INTERNAL FIXATION (ORIF) FIBULA FRACTURE;  Surgeon: [name], MD;  Location: [facility];  Service: Orthopedics;  Laterality: Left;  ORIF LEFT FIBULA,  POSSIBLE REPAIR OF SYNDESMOSIS      reports that he has been smoking Cigarettes.  He has a 50.00 pack-year smoking history. He has never used smokeless tobacco. He reports that he drinks about 45.0 oz of alcohol per week . He reports that he uses drugs, including Cocaine and Marijuana.  No Known Allergies  Family History  Problem Relation Age of Onset  . Other Brother    Family history reviewed and noncontributory  Prior to Admission medications   Medication Sig Start Date End Date Taking? Authorizing Provider  Cholecalciferol (VITAMIN D3) 5000 units CAPS Take 5,000 Units by mouth daily.  Yes [provider]  diltiazem (DILACOR XR) 240 MG 24 hr capsule Take 1 capsule (240 mg total) by mouth daily with supper. 12/20/15  Yes Fay Records, MD  losartan-hydrochlorothiazide (HYZAAR) 100-25 MG tablet Take 0.5 tablets by mouth daily.   Yes [provider]  Magnesium 250 MG TABS Take 1  tablet by mouth daily. 09/08/16  Yes [provider]  sotalol (BETAPACE) 120 MG tablet TAKE 1 TABLET(120 MG) BY MOUTH EVERY 12 HOURS 09/21/16  Yes Evans Lance, MD  ciprofloxacin (CIPRO) 500 MG tablet Take 1 tablet (500 mg total) by mouth 2 (two) times daily. One po bid x 7 days 11/19/16   Julianne Rice, MD  metroNIDAZOLE (FLAGYL) 500 MG tablet Take 1 tablet (500 mg total) by mouth 2 (two) times daily. One po bid x 7 days 11/19/16   Julianne Rice, MD  XARELTO 20 MG TABS tablet TAKE 1 TABLET(20 MG) BY MOUTH DAILY WITH DINNER 06/22/16   Fay Records, MD    Physical Exam: Vitals:   11/19/16 1045 11/19/16 1115 11/19/16 1130 11/19/16 1145  BP: 102/69 113/79 119/71 106/75  Pulse: (!) 54 (!) 52 (!) 54 (!) 56  Resp: 17 18 17 20   Temp:      TempSrc:      SpO2: 95% 92% 94% 91%  Weight:      Height:        Constitutional: NAD, calm, comfortable Vitals:   11/19/16 1045 11/19/16 1115 11/19/16 1130 11/19/16 1145  BP: 102/69 113/79 119/71 106/75  Pulse: (!) 54 (!) 52 (!) 54 (!) 56  Resp: 17 18 17 20   Temp:      TempSrc:      SpO2: 95% 92% 94% 91%  Weight:      Height:       Eyes: PERRL, lids and conjunctivae normal ENMT: Mucous membranes are moist. Posterior pharynx clear of any exudate or lesions.Normal dentition.  Neck: normal, supple, no masses, no thyromegaly Respiratory: clear to auscultation bilaterally, no wheezing, no crackles. Normal respiratory effort. No accessory muscle use.  Cardiovascular: Regular rate and rhythm, no murmurs / rubs / gallops. No extremity edema. 2+ pedal pulses. No carotid bruits.  Abdomen: no tenderness, no masses palpated. No hepatosplenomegaly. Bowel sounds positive. Heme positive stool on rectal exam by ED Musculoskeletal: no clubbing / cyanosis. No joint deformity upper and lower extremities. Good ROM, no contractures. Normal muscle tone.  Skin: no rashes, lesions, ulcers. No induration Neurologic: CN 2-12 grossly intact. Sensation intact, DTR  normal. Strength 5/5 in all 4.  Psychiatric: Normal judgment and insight. Alert and oriented x 3. Normal mood.     Labs on Admission: I have personally reviewed following labs and imaging studies  CBC:  Recent Labs Lab 11/19/16 0809  WBC 8.7  HGB 18.4*  HCT 50.2  MCV 102.9*  PLT 825   Basic Metabolic Panel:  Recent Labs Lab 11/19/16 0809  NA 134*  K 3.7  CL 101  CO2 22  GLUCOSE 103*  BUN 9  CREATININE 0.85  CALCIUM 9.4   GFR: Estimated Creatinine Clearance: 93.8 mL/min (by C-G formula based on SCr of 0.85 mg/dL). Liver Function Tests:  Recent Labs Lab 11/19/16 0809  AST 22  ALT 16*  ALKPHOS 85  BILITOT 0.9  PROT 6.7  ALBUMIN 3.6   No results for input(s): LIPASE, AMYLASE in the last 168 hours. No results for input(s): AMMONIA in the last 168 hours. Coagulation Profile: No results for input(s): INR, PROTIME  in the last 168 hours. Cardiac Enzymes: No results for input(s): CKTOTAL, CKMB, CKMBINDEX, TROPONINI in the last 168 hours. BNP (last 3 results) No results for input(s): PROBNP in the last 8760 hours. HbA1C: No results for input(s): HGBA1C in the last 72 hours. CBG: No results for input(s): GLUCAP in the last 168 hours. Lipid Profile: No results for input(s): CHOL, HDL, LDLCALC, TRIG, CHOLHDL, LDLDIRECT in the last 72 hours. Thyroid Function Tests: No results for input(s): TSH, T4TOTAL, FREET4, T3FREE, THYROIDAB in the last 72 hours. Anemia Panel: No results for input(s): VITAMINB12, FOLATE, FERRITIN, TIBC, IRON, RETICCTPCT in the last 72 hours. Urine analysis:    Component Value Date/Time   COLORURINE YELLOW 12/29/2013 [place] 12/29/2013 1735   LABSPEC 1.023 12/29/2013 1735   PHURINE 6.5 12/29/2013 1735   GLUCOSEU NEGATIVE 12/29/2013 1735   HGBUR NEGATIVE 12/29/2013 1735   BILIRUBINUR NEGATIVE 12/29/2013 1735   KETONESUR NEGATIVE 12/29/2013 1735   PROTEINUR NEGATIVE 12/29/2013 1735   UROBILINOGEN 1.0 12/29/2013 1735    NITRITE NEGATIVE 12/29/2013 1735   LEUKOCYTESUR NEGATIVE 12/29/2013 1735    Radiological Exams on Admission: Ct Abdomen Pelvis W Contrast  Result Date: 11/19/2016 CLINICAL DATA:  Blood in stool. EXAM: CT ABDOMEN AND PELVIS WITH CONTRAST TECHNIQUE: Multidetector CT imaging of the abdomen and pelvis was performed using the standard protocol following bolus administration of intravenous contrast. CONTRAST:  100 ml ISOVUE-300 IOPAMIDOL (ISOVUE-300) INJECTION 61% COMPARISON:  CT chest, abdomen and pelvis 03/17/2016. FINDINGS: Lower chest: Lung bases are clear. No pleural or pericardial effusion. Calcific coronary artery disease is seen. Hepatobiliary: The liver is diffusely low attenuating consistent with fatty infiltration. No focal lesion. The gallbladder and biliary tree appear normal. Pancreas: Unremarkable. No pancreatic ductal dilatation or surrounding inflammatory changes. Spleen: Normal in size without focal abnormality. Adrenals/Urinary Tract: Adrenal glands are unremarkable. Kidneys are normal, without renal calculi, focal lesion, or hydronephrosis. Bladder is unremarkable. Stomach/Bowel: Scattered diverticular disease is seen, worst in the sigmoid. There is stranding about a diverticulum in the proximal ascending colon. Adjacent colonic wall is thickened. The stomach, small bowel and appendix appear normal. Vascular/Lymphatic: Aortoiliac atherosclerosis is identified. No lymphadenopathy. Reproductive: The prostate is unremarkable. Other: No fluid collection. Musculoskeletal: No acute bony abnormality. IMPRESSION: Scattered diverticulosis with findings consistent with acute diverticulitis in the proximal ascending colon. No abscess or perforation. Wall thickening adjacent to the site of diverticulitis is likely inflammatory but if the patient is not current with colon cancer screening, screening is recommended after his acute episode has passed. Diffuse fatty infiltration liver. Calcific aortic and  coronary atherosclerosis. Electronically Signed   By: Inge Rise M.D.   On: 11/19/2016 13:03    EKG: Independently reviewed. From 09/22/2016 shows sinus bradycardia.  Assessment/Plan Active Problems:   Acute diverticulitis   Atrial fibrillation, chronic (HCC)   Elevated cholesterol   Essential hypertension     Acute diverticulitis with anticoagulation for atrial fibrillation: Patient's vital signs are stable and his hemoglobin is 18. He has not had any bleeding since early this morning. And he did not take his Xarelto last night. Patient offered observation status versus discharge home with follow-up tomorrow morning with his primary care physician. As he feels well, he wants to eat, and his father-in-law is at for psych hospital very ill, the patient wishes to go home so his wife can go see her father. I instructed him to return to the emergency department via EMS if he has large amount of blood more than just  on the toilet paper. He is to return if he feels diaphoretic or ill. And he is especially to return if he has any more serious symptoms than those. The patient seems to understand the plan well he will discharge home on oral ciprofloxacin and Flagyl and will see his primary care physician tomorrow for hemoglobin check and examination. At that point his primary care physician can instruct the patient about when to restart his Xarelto. He is instructed to hold his Xarelto tonight.    Lady Deutscher MD FACP [facility]   If 7PM-7AM, please contact night-coverage [email or web address] Password [email or web address]  11/19/2016, 3:40 PM

## 2016-11-23 NOTE — ED Provider Notes (Signed)
[name] DEPT Provider Note   CSN: [id number] Arrival date & time: 11/19/16  0746     History   Chief Complaint Chief Complaint  Patient presents with  . GI Bleeding    HPI Travis Conrad is a 66 y.o. male.  HPI Patient with history of atrial fibrillation on Xarelto presents with bloody stools for the past 24 hours. Denies any lightheadedness or syncope. No focal weakness or numbness. He's had some mild abdominal discomfort associated with the bloody bowel movements. Denies any nausea or vomiting. Taking PO well. Past Medical History:  Diagnosis Date  . Atrial fibrillation ([place])   . High cholesterol    "RX made groin hurt" (08/14/2016)  . Hypertension     Patient Active Problem List   Diagnosis Date Noted  . Acute diverticulitis 11/19/2016  . Atrial fibrillation, chronic ([place]) 11/19/2016  . Elevated cholesterol 11/19/2016  . Essential hypertension 11/19/2016  . Paroxysmal atrial fibrillation ([place]) 08/14/2016    Past Surgical History:  Procedure Laterality Date  . ANKLE FRACTURE SURGERY Left ~ 2012   "put rod and pins in"  . COLONOSCOPY W/ BIOPSIES AND POLYPECTOMY  ~ 2000  . FRACTURE SURGERY    . ORIF FIBULA FRACTURE  06/24/2012   Procedure: OPEN REDUCTION INTERNAL FIXATION (ORIF) FIBULA FRACTURE;  Surgeon: [name], MD;  Location: [facility];  Service: Orthopedics;  Laterality: Left;  ORIF LEFT FIBULA,  POSSIBLE REPAIR OF SYNDESMOSIS        Home Medications    Prior to Admission medications   Medication Sig Start Date End Date Taking? Authorizing Provider  Cholecalciferol (VITAMIN D3) 5000 units CAPS Take 5,000 Units by mouth daily.   Yes [provider]  diltiazem (DILACOR XR) 240 MG 24 hr capsule Take 1 capsule (240 mg total) by mouth daily with supper. 12/20/15  Yes Fay Records, MD  losartan-hydrochlorothiazide (HYZAAR) 100-25 MG tablet Take 0.5 tablets by mouth daily.   Yes [provider]  Magnesium 250 MG TABS Take 1 tablet by  mouth daily. 09/08/16  Yes [provider]  sotalol (BETAPACE) 120 MG tablet TAKE 1 TABLET(120 MG) BY MOUTH EVERY 12 HOURS 09/21/16  Yes Evans Lance, MD  ciprofloxacin (CIPRO) 500 MG tablet Take 1 tablet (500 mg total) by mouth 2 (two) times daily. One po bid x 7 days 11/19/16   Julianne Rice, MD  metroNIDAZOLE (FLAGYL) 500 MG tablet Take 1 tablet (500 mg total) by mouth 2 (two) times daily. One po bid x 7 days 11/19/16   Julianne Rice, MD  XARELTO 20 MG TABS tablet TAKE 1 TABLET(20 MG) BY MOUTH DAILY WITH DINNER 06/22/16   Fay Records, MD    Family History Family History  Problem Relation Age of Onset  . Other Brother     Social History Social History  Substance Use Topics  . Smoking status: Current Every Day Smoker    Packs/day: 1.00    Years: 50.00    Types: Cigarettes  . Smokeless tobacco: Never Used  . Alcohol use 45.0 oz/week    75 Shots of liquor per week     Comment: 08/14/2016 "1 pint whiskey /day; last drink was yesterday"     Allergies   Patient has no known allergies.   Review of Systems Review of Systems  Constitutional: Negative for chills and fever.  Respiratory: Negative for cough and shortness of breath.   Cardiovascular: Negative for chest pain, palpitations and leg swelling.  Gastrointestinal: Positive for abdominal pain  and blood in stool. Negative for constipation, diarrhea, nausea and vomiting.  Genitourinary: Negative for dysuria, flank pain and frequency.  Musculoskeletal: Negative for back pain, myalgias, neck pain and neck stiffness.  Neurological: Negative for dizziness, weakness, light-headedness, numbness and headaches.  All other systems reviewed and are negative.    Physical Exam Updated Vital Signs BP 106/75   Pulse (!) 56   Temp 97.3 F (36.3 C) (Oral)   Resp 20   Ht 6' (1.829 m)   Wt 83.9 kg (185 lb)   SpO2 91%   BMI 25.09 kg/m   Physical Exam  Constitutional: He is oriented to person, place, and time. He appears  well-developed and well-nourished. No distress.  HENT:  Head: Normocephalic and atraumatic.  Mouth/Throat: Oropharynx is clear and moist.  Eyes: EOM are normal. Pupils are equal, round, and reactive to light.  Neck: Normal range of motion. Neck supple.  Cardiovascular: Normal rate and regular rhythm.   Pulmonary/Chest: Effort normal and breath sounds normal.  Abdominal: Soft. Bowel sounds are normal. There is tenderness (mild left lower abdominal tenderness to palpation. No rebound or guarding.). There is no rebound and no guarding.  Genitourinary: Rectal exam shows guaiac positive stool.  Genitourinary Comments: Grossly bloody stool on rectal exam. No obvious hemorrhoidal bleeding.  Musculoskeletal: Normal range of motion. He exhibits no edema or tenderness.  Neurological: He is alert and oriented to person, place, and time.  Moving all extremities without focal deficit. Sensation intact.  Skin: Skin is warm and dry. No rash noted. No erythema.  Psychiatric: He has a normal mood and affect. His behavior is normal.  Nursing note and vitals reviewed.    ED Treatments / Results  Labs (all labs ordered are listed, but only abnormal results are displayed) Labs Reviewed  COMPREHENSIVE METABOLIC PANEL - Abnormal; Notable for the following:       Result Value   Sodium 134 (*)    Glucose, Bld 103 (*)    ALT 16 (*)    All other components within normal limits  CBC - Abnormal; Notable for the following:    Hemoglobin 18.4 (*)    MCV 102.9 (*)    MCH 37.7 (*)    MCHC 36.7 (*)    All other components within normal limits  POC OCCULT BLOOD, ED - Abnormal; Notable for the following:    Fecal Occult Bld POSITIVE (*)    All other components within normal limits  TYPE AND SCREEN  ABO/RH    EKG  EKG Interpretation None       Radiology No results found.  Procedures Procedures (including critical care time)  Medications Ordered in ED Medications  iopamidol (ISOVUE-300) 61 %  injection (100 mLs  Contrast Given 11/19/16 1233)     Initial Impression / Assessment and Plan / ED Course  I have reviewed the triage vital signs and the nursing notes.  Pertinent labs & imaging results that were available during my care of the patient were reviewed by me and considered in my medical decision making (see chart for details).     Started on IV Cipro and Flagyl for diverticulitis. Hemoglobin is stable. Discussed with hospitalist who will evaluate in the emergency department.  Final Clinical Impressions(s) / ED Diagnoses   Final diagnoses:  Acute GI bleeding  Diverticulitis    New Prescriptions Discharge Medication List as of 11/19/2016  3:18 PM    START taking these medications   Details  ciprofloxacin (CIPRO) 500 MG tablet Take  1 tablet (500 mg total) by mouth 2 (two) times daily. One po bid x 7 days, Starting Sun 11/19/2016, Print    metroNIDAZOLE (FLAGYL) 500 MG tablet Take 1 tablet (500 mg total) by mouth 2 (two) times daily. One po bid x 7 days, Starting Sun 11/19/2016, Print         Julianne Rice, MD 11/23/16 [phone]

## 2017-01-10 ENCOUNTER — Other Ambulatory Visit: Payer: Self-pay | Admitting: Internal Medicine

## 2017-01-10 DIAGNOSIS — I10 Essential (primary) hypertension: Secondary | ICD-10-CM

## 2017-01-10 DIAGNOSIS — E785 Hyperlipidemia, unspecified: Secondary | ICD-10-CM

## 2017-01-29 ENCOUNTER — Encounter: Payer: Self-pay | Admitting: Internal Medicine

## 2017-02-04 ENCOUNTER — Other Ambulatory Visit: Payer: Self-pay | Admitting: Internal Medicine

## 2017-02-05 ENCOUNTER — Other Ambulatory Visit: Payer: Self-pay | Admitting: Internal Medicine

## 2017-02-05 NOTE — Telephone Encounter (Signed)
Age 66 years Wt 84.1kg 09/21/2016 11/19/2016 Hgb 18.4 HCT 50.2 11/19/2016 SrCr 0.85 Saw Dr Lovena Le on 09/21/2016  CrCl 101.66  Refill done for Xarelto 20 mg daily as requested

## 2017-02-14 ENCOUNTER — Encounter: Payer: Self-pay | Admitting: Internal Medicine

## 2017-02-14 ENCOUNTER — Ambulatory Visit (INDEPENDENT_AMBULATORY_CARE_PROVIDER_SITE_OTHER): Payer: Medicare Other | Admitting: Internal Medicine

## 2017-02-14 VITALS — BP 122/78 | HR 79 | Ht 72.0 in | Wt 174.4 lb

## 2017-02-14 DIAGNOSIS — I480 Paroxysmal atrial fibrillation: Secondary | ICD-10-CM

## 2017-02-14 NOTE — Progress Notes (Signed)
HPI Travis Conrad returns today for followup. He is a pleasant 66 yo man with PAF, who was placed on sotalol, HTN, and alcohol abuse. The patient still drinks a pint of whiskey every day. Over the last few weeks, he has complained of generalized fatigue. He has a hard time knowing when he is in atrial fibrillation. He had a significant improvement on sotalol initially and was in sinus rhythm. Finally, the patient notes an 11 pound weight loss. No Known Allergies   Current Outpatient Prescriptions  Medication Sig Dispense Refill  . Cholecalciferol (VITAMIN D3) 5000 units CAPS Take 5,000 Units by mouth daily.    [name] DILT-XR 240 MG 24 hr capsule TAKE 1 CAPSULE(240 MG) BY MOUTH DAILY WITH SUPPER 90 capsule 0  . losartan-hydrochlorothiazide (HYZAAR) 100-25 MG tablet Take 0.5 tablets by mouth daily. 45 tablet 1  . Magnesium 250 MG TABS Take 1 tablet by mouth daily.    . sotalol (BETAPACE) 120 MG tablet TAKE 1 TABLET(120 MG) BY MOUTH EVERY 12 HOURS 180 tablet 1  . XARELTO 20 MG TABS tablet TAKE 1 TABLET(20 MG) BY MOUTH DAILY WITH DINNER 30 tablet 5   No current facility-administered medications for this visit.      Past Medical History:  Diagnosis Date  . Atrial fibrillation ([place])   . High cholesterol    "RX made groin hurt" (08/14/2016)  . Hypertension     ROS:   All systems reviewed and negative except as noted in the HPI.   Past Surgical History:  Procedure Laterality Date  . ANKLE FRACTURE SURGERY Left ~ 2012   "put rod and pins in"  . COLONOSCOPY W/ BIOPSIES AND POLYPECTOMY  ~ 2000  . FRACTURE SURGERY    . ORIF FIBULA FRACTURE  06/24/2012   Procedure: OPEN REDUCTION INTERNAL FIXATION (ORIF) FIBULA FRACTURE;  Surgeon: [name], MD;  Location: [facility];  Service: Orthopedics;  Laterality: Left;  ORIF LEFT FIBULA,  POSSIBLE REPAIR OF SYNDESMOSIS      Family History  Problem Relation Age of Onset  . Other Brother      Social History   Social History  . Marital  status: Married    Spouse name: N/A  . Number of children: N/A  . Years of education: N/A   Occupational History  . Not on file.   Social History Main Topics  . Smoking status: Current Every Day Smoker    Packs/day: 1.00    Years: 50.00    Types: Cigarettes  . Smokeless tobacco: Never Used  . Alcohol use 45.0 oz/week    75 Shots of liquor per week     Comment: 08/14/2016 "1 pint whiskey /day; last drink was yesterday"  . Drug use: Yes    Types: Cocaine, Marijuana     Comment: 08/14/2016 "last cocaine was in early 1990s; smoked pot yesterday"  . Sexual activity: Not Currently   Other Topics Concern  . Not on file   Social History Narrative  . No narrative on file     BP 122/78   Pulse 79   Ht 6' (1.829 m)   Wt 174 lb 6.4 oz (79.1 kg)   SpO2 96%   BMI 23.65 kg/m   Physical Exam:   Stable appearing 66 year old man, NAD HEENT: Unremarkable Neck:  No JVD, no thyromegally Lymphatics:  No adenopathy Back:  No CVA tenderness Lungs:  Clear HEART:  Regular rate rhythm, no murmurs, no rubs, no clicks Abd:  soft, positive  bowel sounds, no organomegally, no rebound, no guarding Ext:  2 plus pulses, no edema, no cyanosis, no clubbing Skin:  No rashes no nodules Neuro:  CN II through XII intact, motor grossly intact  EKG - atrial fibrillation with a controlled ventricular response   Assess/Plan: 1. Persistent atrial fibrillation - I discussed the treatment options with the patient including changing antiarrhythmic drug therapy versus catheter ablation versus rate control versus continuing as we are. After approximately 20 minutes of discussion on this topic alone, the patient wants to continue taking sotalol as he states that he feels much better on this medication. He is in atrial fibrillation today, but it is unclear how often and how much of the time he is in sinus rhythm. He thinks he is in sinus rhythm more often than not. 2. Alcohol abuse - he continues to use alcohol in  excess. I've encouraged the patient to stop drinking 3. Hypertension - his blood pressure today is reasonably well controlled. He is encouraged to maintain a low-sodium diet. 4. Weight loss - I suspect this is related to his alcohol intake. He is encouraged to increase his caloric consumption. I reviewed his labs nurse no evidence of elevated liver function tests.  Travis Conrad.D.

## 2017-02-14 NOTE — Patient Instructions (Addendum)
Medication Instructions:  Your physician recommends that you continue on your current medications as directed. Please refer to the Current Medication list given to you today.  Labwork: None ordered.  Testing/Procedures: None ordered.  Follow-Up: Your physician wants you to follow-up in: 3 to 4 months with [name].    Any Other Special Instructions Will Be Listed Below (If Applicable).  Eat more high calorie foods.   If you need a refill on your cardiac medications before your next appointment, please call your pharmacy.

## 2017-03-07 ENCOUNTER — Other Ambulatory Visit: Payer: Self-pay | Admitting: Nurse Practitioner

## 2017-05-09 ENCOUNTER — Other Ambulatory Visit: Payer: Self-pay | Admitting: Internal Medicine

## 2017-05-28 ENCOUNTER — Ambulatory Visit: Payer: Medicare Other | Admitting: Internal Medicine

## 2017-06-14 NOTE — Progress Notes (Deleted)
Cardiology Office Note    Date:  06/14/2017   ID:  Travis Conrad, Travis Conrad 17, 1952, MRN [id number]  PCP:  [name], MD  Cardiologist:  [name] Electrophysiologist: Dr. Lovena Le  Chief Complaint: 3 Months follow up  History of Present Illness:   Travis Conrad is a 66 y.o. male with hx of peroxysmal atrial fibrillation on Sotalol, ongoing alcohol abuse, and HTN presents for follow up.   Last seen by Dr. Lovena Le 02/08/17. Siscussed the treatment options of changing antiarrhythmic drug therapy versus catheter ablation versus rate control versus continuing as we are.The patient wants to continue taking sotalol as he states that he feels much better on this medication.   Here today for follow up.      Past Medical History:  Diagnosis Date  . Atrial fibrillation ([place])   . High cholesterol    "RX made groin hurt" (08/14/2016)  . Hypertension     Past Surgical History:  Procedure Laterality Date  . ANKLE FRACTURE SURGERY Left ~ 2012   "put rod and pins in"  . COLONOSCOPY W/ BIOPSIES AND POLYPECTOMY  ~ 2000  . FRACTURE SURGERY    . ORIF FIBULA FRACTURE  06/24/2012   Procedure: OPEN REDUCTION INTERNAL FIXATION (ORIF) FIBULA FRACTURE;  Surgeon: [name], MD;  Location: [facility];  Service: Orthopedics;  Laterality: Left;  ORIF LEFT FIBULA,  POSSIBLE REPAIR OF SYNDESMOSIS     Current Medications: Prior to Admission medications   Medication Sig Start Date End Date Taking? Authorizing Provider  Cholecalciferol (VITAMIN D3) 5000 units CAPS Take 5,000 Units by mouth daily.    [provider]  DILT-XR 240 MG 24 hr capsule TAKE 1 CAPSULE(240 MG) BY MOUTH DAILY WITH SUPPER 05/09/17   Fay Records, MD  losartan-hydrochlorothiazide (HYZAAR) 100-25 MG tablet Take 0.5 tablets by mouth daily. 01/11/17   Fay Records, MD  Magnesium 250 MG TABS Take 1 tablet by mouth daily. 09/08/16   [provider]  sotalol (BETAPACE) 120 MG tablet TAKE 1 TABLET BY MOUTH EVERY 12 HOURS  03/07/17   Evans Lance, MD  XARELTO 20 MG TABS tablet TAKE 1 TABLET(20 MG) BY MOUTH DAILY WITH DINNER 02/05/17   Fay Records, MD    Allergies:   Patient has no known allergies.   Social History   Socioeconomic History  . Marital status: Married    Spouse name: Not on file  . Number of children: Not on file  . Years of education: Not on file  . Highest education level: Not on file  Social Needs  . Financial resource strain: Not on file  . Food insecurity - worry: Not on file  . Food insecurity - inability: Not on file  . Transportation needs - medical: Not on file  . Transportation needs - non-medical: Not on file  Occupational History  . Not on file  Tobacco Use  . Smoking status: Current Every Day Smoker    Packs/day: 1.00    Years: 50.00    Pack years: 50.00    Types: Cigarettes  . Smokeless tobacco: Never Used  Substance and Sexual Activity  . Alcohol use: Yes    Alcohol/week: 45.0 oz    Types: 75 Shots of liquor per week    Comment: 08/14/2016 "1 pint whiskey /day; last drink was yesterday"  . Drug use: Yes    Types: Cocaine, Marijuana    Comment: 08/14/2016 "last cocaine was in early 1990s; smoked pot yesterday"  .  Sexual activity: Not Currently  Other Topics Concern  . Not on file  Social History Narrative  . Not on file     Family History:  The patient's family history includes Other in his brother. ***  ROS:   Please see the history of present illness.    ROS All other systems reviewed and are negative.   PHYSICAL EXAM:   VS:  There were no vitals taken for this visit.   GEN: Well nourished, well developed, in no acute distress  HEENT: normal  Neck: no JVD, carotid bruits, or masses Cardiac: ***RRR; no murmurs, rubs, or gallops,no edema  Respiratory:  clear to auscultation bilaterally, normal work of breathing GI: soft, nontender, nondistended, + BS MS: no deformity or atrophy  Skin: warm and dry, no rash Neuro:  Alert and Oriented x 3, Strength  and sensation are intact Psych: euthymic mood, full affect  Wt Readings from Last 3 Encounters:  02/14/17 174 lb 6.4 oz (79.1 kg)  11/19/16 185 lb (83.9 kg)  09/21/16 185 lb 6.4 oz (84.1 kg)      Studies/Labs Reviewed:   EKG:  EKG is ordered today.  The ekg ordered today demonstrates ***  Recent Labs: 09/01/2016: Magnesium 1.9 11/19/2016: ALT 16; BUN 9; Creatinine, Ser 0.85; Hemoglobin 18.4; Platelets 226; Potassium 3.7; Sodium 134   Lipid Panel    Component Value Date/Time   CHOL 212 (H) 06/04/2015 1022   TRIG 167 (H) 06/04/2015 1022   HDL 43 06/04/2015 1022   CHOLHDL 4.9 06/04/2015 1022   VLDL 33 (H) 06/04/2015 1022   LDLCALC 136 (H) 06/04/2015 1022    Additional studies/ records that were reviewed today include:   Echocardiogram: 08/15/2016 Study Conclusions  - Left ventricle: The cavity size was normal. Wall thickness was   normal. Systolic function was normal. The estimated ejection   fraction was in the range of 60% to 65%. Left ventricular   diastolic function parameters were normal. - Aortic valve: Trileaflet. Sclerosis without stenosis. There was   mild regurgitation. - Mitral valve: Mildly thickened leaflets . There was trivial   regurgitation. - Left atrium: Severely dilated. - Right ventricle: The cavity size was normal. Systolic function is   mildly reduced. - Inferior vena cava: The vessel was normal in size. The   respirophasic diameter changes were in the normal range (= 50%),   consistent with normal central venous pressure.  Impressions:  - Compared to a prior echo in 2015, the LVEF is higher at 60-65%.   There is severe LAE.   Cardiac monitor 10/2015 Sinus rhythm, atrial fibrillation and atrial flutter. Some of palpitations correlate with atrial fibrillation.  But often atrial fibrillation (even with rapid rates) was not detected.      ASSESSMENT & PLAN:    1. Paroxysmal atrial fibrillation  2. Alcohol abuse  3.  HTN    Medication Adjustments/Labs and Tests Ordered: Current medicines are reviewed at length with the patient today.  Concerns regarding medicines are outlined above.  Medication changes, Labs and Tests ordered today are listed in the Patient Instructions below. There are no Patient Instructions on file for this visit.   Jarrett Soho, [place]  06/14/2017 4:23 PM    [facility] [address], [place], [place]  [address] Phone: [phone]; Fax: [phone]

## 2017-06-20 ENCOUNTER — Ambulatory Visit: Payer: Medicare Other | Admitting: Physician Assistant

## 2017-07-10 NOTE — Progress Notes (Signed)
Cardiology Office Note    Date:  07/12/2017   ID:  Travis Conrad, Travis Conrad 1950-12-12, MRN [id number]  PCP:  [name], MD  Cardiologist:  [name] Electrophysiologist: Dr. Lovena Le  Chief Complaint: 4 Months follow up  History of Present Illness:   Travis Conrad is a 67 y.o. male with hx of PAF on sotalol, HTN and alcohol abuse presents for follow up.   Last seen by Dr. Lovena Le 02/14/2017. Noted in afib. Unknown duration.  Discussed the treatment options with the patient including changing antiarrhythmic drug therapy versus catheter ablation versus rate control versus continuing as we are. After approximately 20 minutes of discussion on this topic alone, the patient wants to continue taking sotalol as he states that he feels much better on this medication. Encouraged alcohol cessation.   Here today for follow up. He drinks less then 1 pine a day. Has used to drink more than 1 pine/day. He smoking less than 1 pack per day. He is retired now. No exercise or regular activity. Mostly stays at home and not active. Denies chest pain, SOB, LE edema, orthopnea, PND, syncope, dizziness, melena. He has he goes in afib for approximately 2 days/week. Improved. Last for few hours with self resolution. Anxiety and stress exacerbates his symptoms.    Past Medical History:  Diagnosis Date  . Atrial fibrillation ([place])   . High cholesterol    "RX made groin hurt" (08/14/2016)  . Hypertension     Past Surgical History:  Procedure Laterality Date  . ANKLE FRACTURE SURGERY Left ~ 2012   "put rod and pins in"  . COLONOSCOPY W/ BIOPSIES AND POLYPECTOMY  ~ 2000  . FRACTURE SURGERY    . ORIF FIBULA FRACTURE  06/24/2012   Procedure: OPEN REDUCTION INTERNAL FIXATION (ORIF) FIBULA FRACTURE;  Surgeon: [name], MD;  Location: [place];  Service: Orthopedics;  Laterality: Left;  ORIF LEFT FIBULA,  POSSIBLE REPAIR OF SYNDESMOSIS     Current Medications: Prior to Admission medications   Medication Sig  Start Date End Date Taking? Authorizing Provider  Cholecalciferol (VITAMIN D3) 5000 units CAPS Take 5,000 Units by mouth daily.    [provider]  DILT-XR 240 MG 24 hr capsule TAKE 1 CAPSULE(240 MG) BY MOUTH DAILY WITH SUPPER 05/09/17   Fay Records, MD  losartan-hydrochlorothiazide (HYZAAR) 100-25 MG tablet Take 0.5 tablets by mouth daily. 01/11/17   Fay Records, MD  Magnesium 250 MG TABS Take 1 tablet by mouth daily. 09/08/16   [provider]  sotalol (BETAPACE) 120 MG tablet TAKE 1 TABLET BY MOUTH EVERY 12 HOURS 03/07/17   Evans Lance, MD  XARELTO 20 MG TABS tablet TAKE 1 TABLET(20 MG) BY MOUTH DAILY WITH DINNER 02/05/17   Fay Records, MD    Allergies:   Patient has no known allergies.   Social History   Socioeconomic History  . Marital status: Married    Spouse name: None  . Number of children: None  . Years of education: None  . Highest education level: None  Social Needs  . Financial resource strain: None  . Food insecurity - worry: None  . Food insecurity - inability: None  . Transportation needs - medical: None  . Transportation needs - non-medical: None  Occupational History  . None  Tobacco Use  . Smoking status: Current Every Day Smoker    Packs/day: 1.00    Years: 50.00    Pack years: 50.00    Types:  Cigarettes  . Smokeless tobacco: Never Used  Substance and Sexual Activity  . Alcohol use: Yes    Alcohol/week: 45.0 oz    Types: 75 Shots of liquor per week    Comment: 08/14/2016 "1 pint whiskey /day; last drink was yesterday"  . Drug use: Yes    Types: Cocaine, Marijuana    Comment: 08/14/2016 "last cocaine was in early 1990s; smoked pot yesterday"  . Sexual activity: Not Currently  Other Topics Concern  . None  Social History Narrative  . None     Family History:  The patient's family history includes Other in his brother.   ROS:   Please see the history of present illness.    ROS All other systems reviewed and are  negative.   PHYSICAL EXAM:   VS:  BP 108/70   Pulse (!) 55   Ht 6' (1.829 m)   Wt 180 lb 9.6 oz (81.9 kg)   BMI 24.49 kg/m    GEN: Well nourished, well developed, in no acute distress  HEENT: normal  Neck: no JVD, carotid bruits, or masses Cardiac: RRR; no murmurs, rubs, or gallops,no edema  Respiratory: Diffuse wheezing at base bilaterally  GI: soft, nontender, nondistended, + BS MS: no deformity or atrophy  Skin: warm and dry, no rash Neuro:  Alert and Oriented x 3, Strength and sensation are intact Psych: euthymic mood, full affect  Wt Readings from Last 3 Encounters:  07/12/17 180 lb 9.6 oz (81.9 kg)  02/14/17 174 lb 6.4 oz (79.1 kg)  11/19/16 185 lb (83.9 kg)      Studies/Labs Reviewed:   EKG:  EKG is ordered today.  The ekg ordered today demonstrates Sinus bradycardia at 55 bpm , new TWI inferior laterally   Recent Labs: 09/01/2016: Magnesium 1.9 11/19/2016: ALT 16; BUN 9; Creatinine, Ser 0.85; Hemoglobin 18.4; Platelets 226; Potassium 3.7; Sodium 134   Lipid Panel    Component Value Date/Time   CHOL 212 (H) 06/04/2015 1022   TRIG 167 (H) 06/04/2015 1022   HDL 43 06/04/2015 1022   CHOLHDL 4.9 06/04/2015 1022   VLDL 33 (H) 06/04/2015 1022   LDLCALC 136 (H) 06/04/2015 1022    Additional studies/ records that were reviewed today include:   Echo 08/15/2016 Study Conclusions  - Left ventricle: The cavity size was normal. Wall thickness was   normal. Systolic function was normal. The estimated ejection   fraction was in the range of 60% to 65%. Left ventricular   diastolic function parameters were normal. - Aortic valve: Trileaflet. Sclerosis without stenosis. There was   mild regurgitation. - Mitral valve: Mildly thickened leaflets . There was trivial   regurgitation. - Left atrium: Severely dilated. - Right ventricle: The cavity size was normal. Systolic function is   mildly reduced. - Inferior vena cava: The vessel was normal in size. The    respirophasic diameter changes were in the normal range (= 50%),   consistent with normal central venous pressure.  Impressions:  - Compared to a prior echo in 2015, the LVEF is higher at 60-65%.   There is severe LAE.   ASSESSMENT & PLAN:    1.  Paroxysmal atrial fibrillation   - symptoms improving however still stays in 2days/week. Encourage to  Avoid trigger. Get breathing and regular exercise. Continue sotalol, Cardizem and Xarelto. Will check CBC and BMET today.   2.  Alcohol abuse - Encouraged complete cessation,. Educated regarding potential complications of afib and on anticoagulation. He understand the risk.  3.  Hypertension - STable  4. Tobacco abuse - Education given. encouraged cessation.   5.Abnormal EKG - subtle T wave inversion in inferior lateral leads. New compared to prior EKG. No ischemic evaluation warranted currently given lack of symptoms. He will give [place] call if develops chest pain or shortness of breath. Reviewed with [name].   6. Wheezing and chronic cough - seem early bronchitis. Advised to follow up with PCP. No fever or chills.   Medication Adjustments/Labs and Tests Ordered: Current medicines are reviewed at length with the patient today.  Concerns regarding medicines are outlined above.  Medication changes, Labs and Tests ordered today are listed in the Patient Instructions below. Patient Instructions  Medication Instructions:   Your physician recommends that you continue on your current medications as directed. Please refer to the Current Medication list given to you today.    Labwork:  TODAY--BMET AND CBC W DIFF    Follow-Up:  Your physician wants you to follow-up in: [place] will receive a reminder letter in the mail two months in advance. If you don't receive a letter, please call our office to schedule the follow-up appointment.        If you need a refill on your cardiac medications before your next  appointment, please call your pharmacy.      Jarrett Soho, [place]  07/12/2017 10:37 AM    [facility] [address], [place], [place]  [address] Phone: [phone]; Fax: [phone]

## 2017-07-12 ENCOUNTER — Ambulatory Visit: Payer: Medicare Other | Admitting: Physician Assistant

## 2017-07-12 ENCOUNTER — Encounter: Payer: Self-pay | Admitting: Physician Assistant

## 2017-07-12 VITALS — BP 108/70 | HR 55 | Ht 72.0 in | Wt 180.6 lb

## 2017-07-12 DIAGNOSIS — F1010 Alcohol abuse, uncomplicated: Secondary | ICD-10-CM

## 2017-07-12 DIAGNOSIS — F17200 Nicotine dependence, unspecified, uncomplicated: Secondary | ICD-10-CM | POA: Diagnosis not present

## 2017-07-12 DIAGNOSIS — J40 Bronchitis, not specified as acute or chronic: Secondary | ICD-10-CM | POA: Diagnosis not present

## 2017-07-12 DIAGNOSIS — I10 Essential (primary) hypertension: Secondary | ICD-10-CM

## 2017-07-12 DIAGNOSIS — I480 Paroxysmal atrial fibrillation: Secondary | ICD-10-CM

## 2017-07-12 DIAGNOSIS — R9431 Abnormal electrocardiogram [ECG] [EKG]: Secondary | ICD-10-CM | POA: Diagnosis not present

## 2017-07-12 DIAGNOSIS — Z7901 Long term (current) use of anticoagulants: Secondary | ICD-10-CM

## 2017-07-12 NOTE — Patient Instructions (Signed)
Medication Instructions:   Your physician recommends that you continue on your current medications as directed. Please refer to the Current Medication list given to you today.    Labwork:  TODAY--BMET AND CBC W DIFF    Follow-Up:  Your physician wants you to follow-up in: [place] will receive a reminder letter in the mail two months in advance. If you don't receive a letter, please call our office to schedule the follow-up appointment.        If you need a refill on your cardiac medications before your next appointment, please call your pharmacy.

## 2017-07-13 ENCOUNTER — Other Ambulatory Visit: Payer: Self-pay | Admitting: Internal Medicine

## 2017-07-13 DIAGNOSIS — I10 Essential (primary) hypertension: Secondary | ICD-10-CM

## 2017-07-13 DIAGNOSIS — E785 Hyperlipidemia, unspecified: Secondary | ICD-10-CM

## 2017-07-16 LAB — BASIC METABOLIC PANEL
BUN / CREAT RATIO: 10 (ref 10–24)
BUN: 11 mg/dL (ref 8–27)
CO2: 25 mmol/L (ref 20–29)
CREATININE: 1.08 mg/dL (ref 0.76–1.27)
Calcium: 9.8 mg/dL (ref 8.6–10.2)
Chloride: 96 mmol/L (ref 96–106)
GFR calc Af Amer: 82 mL/min/{1.73_m2} (ref >59)
GFR, EST NON AFRICAN AMERICAN: 71 mL/min/{1.73_m2} (ref >59)
Glucose: 107 mg/dL — ABNORMAL HIGH (ref 65–99)
Potassium: 4.5 mmol/L (ref 3.5–5.2)
SODIUM: 138 mmol/L (ref 134–144)

## 2017-07-16 LAB — CBC WITH DIFFERENTIAL/PLATELET
BASOS: 0 %
Basophils Absolute: 0 10*3/uL (ref 0.0–0.2)
EOS (ABSOLUTE): 0.1 10*3/uL (ref 0.0–0.4)
EOS: 1 %
Hematocrit: 57.1 % — ABNORMAL HIGH (ref 37.5–51.0)
Hemoglobin: 20.5 g/dL (ref 13.0–17.7)
Immature Grans (Abs): 0 10*3/uL (ref 0.0–0.1)
Immature Granulocytes: 0 %
Lymphocytes Absolute: 3.2 10*3/uL — ABNORMAL HIGH (ref 0.7–3.1)
Lymphs: 31 %
MCH: 38.4 pg — ABNORMAL HIGH (ref 26.6–33.0)
MCHC: 35.9 g/dL — ABNORMAL HIGH (ref 31.5–35.7)
MCV: 107 fL — AB (ref 79–97)
MONOCYTES: 11 %
MONOS ABS: 1.2 10*3/uL — AB (ref 0.1–0.9)
Neutrophils Absolute: 5.7 10*3/uL (ref 1.4–7.0)
Neutrophils: 57 %
PLATELETS: 207 10*3/uL (ref 150–379)
RBC: 5.34 x10E6/uL (ref 4.14–5.80)
RDW: 13.4 % (ref 12.3–15.4)
WBC: 10.2 10*3/uL (ref 3.4–10.8)

## 2017-08-11 ENCOUNTER — Other Ambulatory Visit: Payer: Self-pay | Admitting: Internal Medicine

## 2017-08-15 ENCOUNTER — Encounter: Payer: Self-pay | Admitting: *Deleted

## 2017-09-01 ENCOUNTER — Other Ambulatory Visit: Payer: Self-pay | Admitting: Internal Medicine

## 2018-02-08 ENCOUNTER — Ambulatory Visit: Payer: Medicare Other | Admitting: Internal Medicine

## 2018-02-12 ENCOUNTER — Other Ambulatory Visit: Payer: Self-pay | Admitting: Internal Medicine

## 2018-03-02 IMAGING — CT CT CHEST W/ CM
2 of 5 series · 13 of 46 positions shown, 15 images · IV contrast (iopamidol)
Comparison: No priors.

CLINICAL DATA: 64-year-old male with 5-10 pound weight loss since
December 2015, with concurrent lower chest and upper abdominal pain
since that time.

EXAM:
CT CHEST, ABDOMEN, AND PELVIS WITH CONTRAST
TECHNIQUE: Multidetector CT imaging of the chest, abdomen and pelvis was
performed following the standard protocol during bolus
administration of intravenous contrast.
CONTRAST:  100mL [id number] IOPAMIDOL ([id number]) INJECTION 61%

[Series 2: c/a/p with · axial · 0.83mm/px · z∈[-624,-69]mm · 10 of 135 slices shown, 12 images]
[im 12/135  soft-tissue]
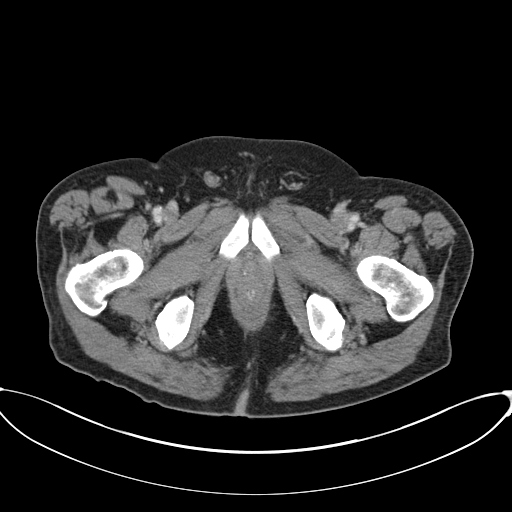
[im 12/135  bone]
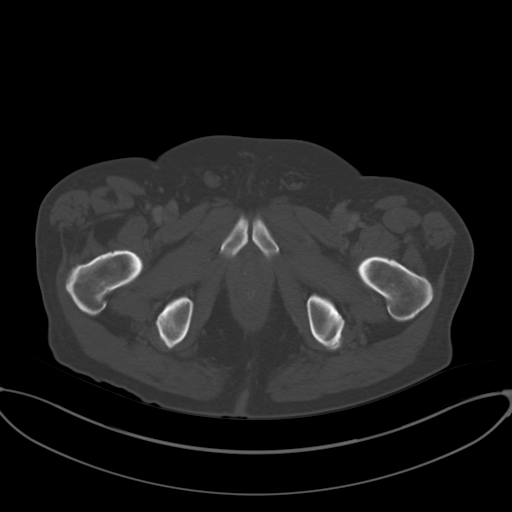
[im 23/135  soft-tissue]
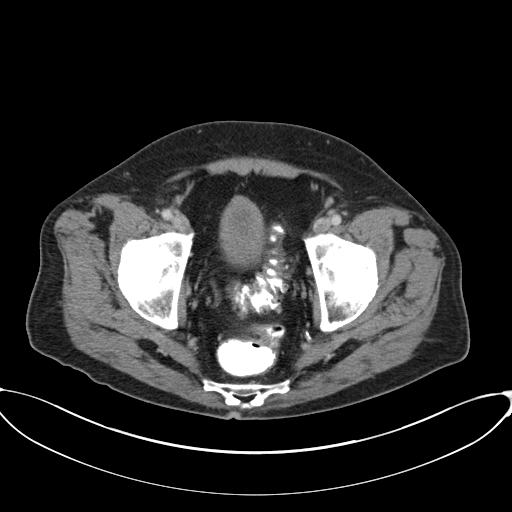
[im 34/135  soft-tissue]
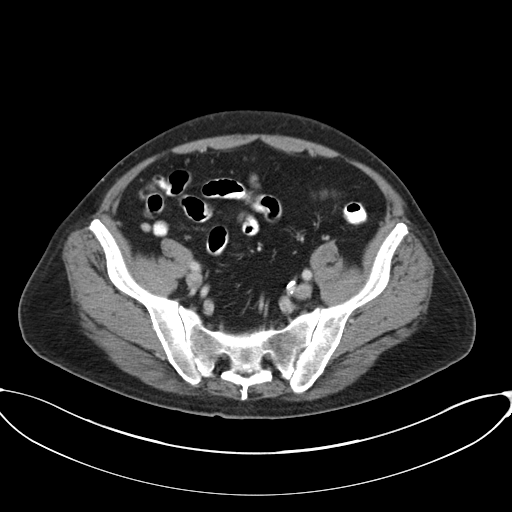
[im 45/135  soft-tissue]
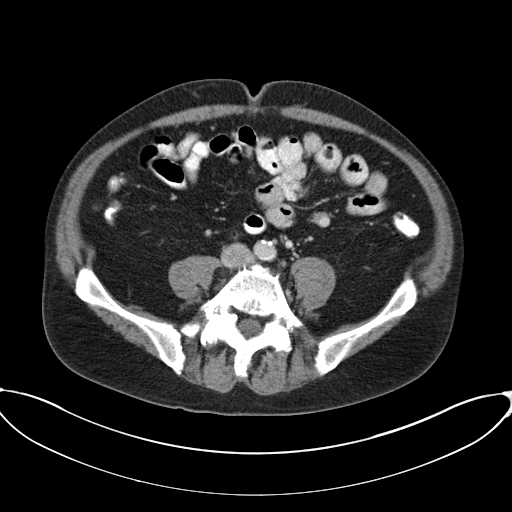
[im 56/135  soft-tissue]
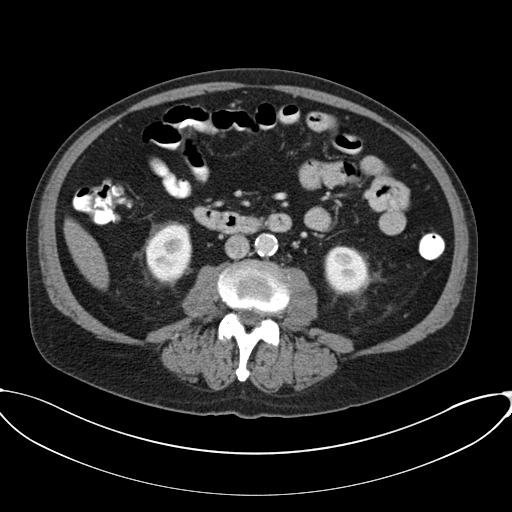
[im 79/135  soft-tissue]
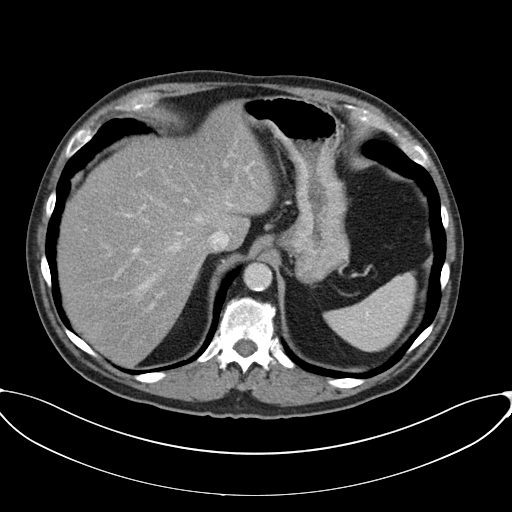
[im 90/135  soft-tissue]
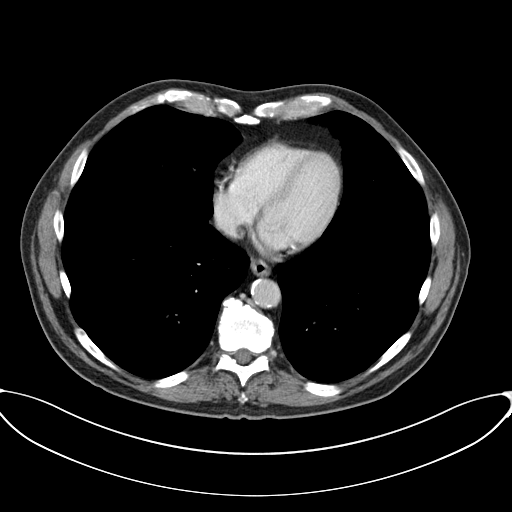
[im 101/135  soft-tissue]
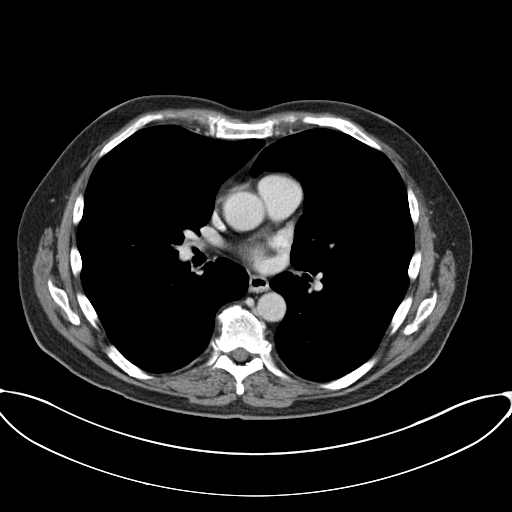
[im 112/135  soft-tissue]
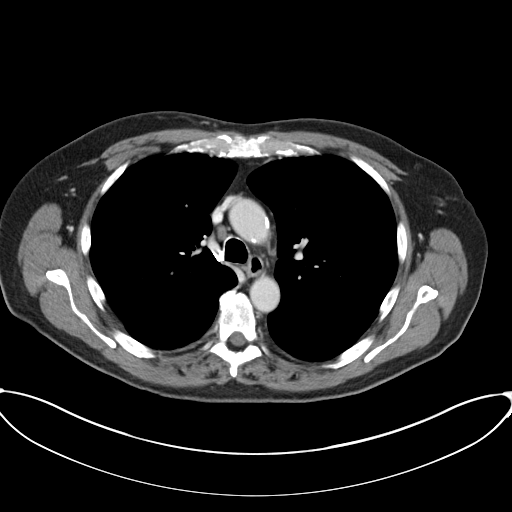
[im 112/135  bone]
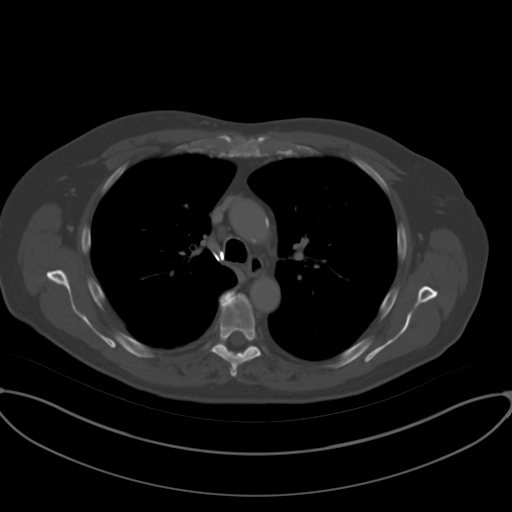
[im 123/135  soft-tissue]
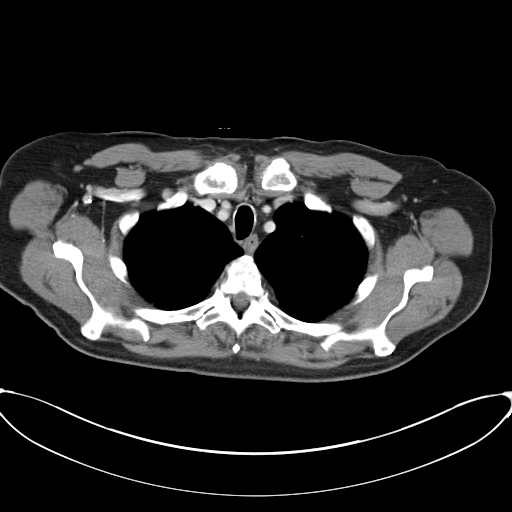

[Series 5: coronal a/|p · coronal · 0.84mm/px · 3 of 166 slices shown]
[im 56/166  soft-tissue]
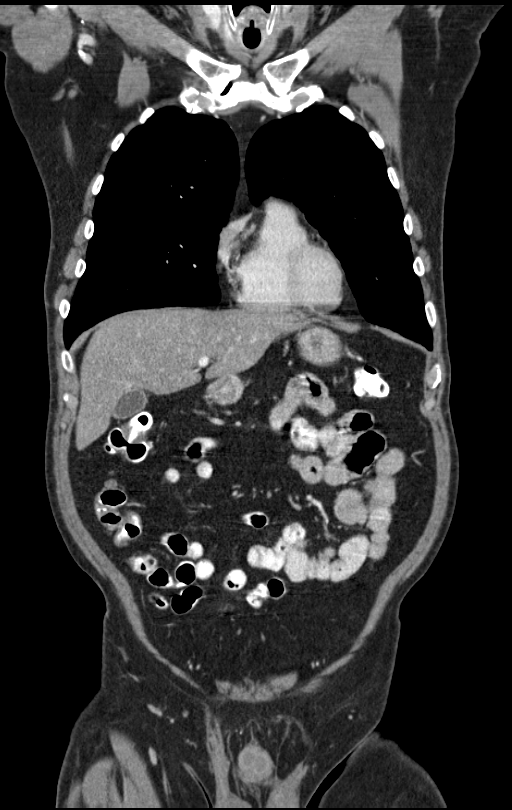
[im 74/166  soft-tissue]
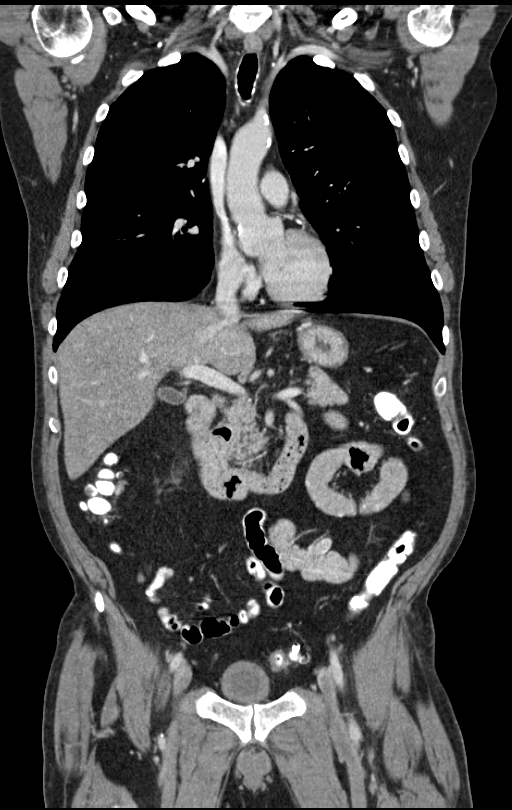
[im 92/166  soft-tissue]
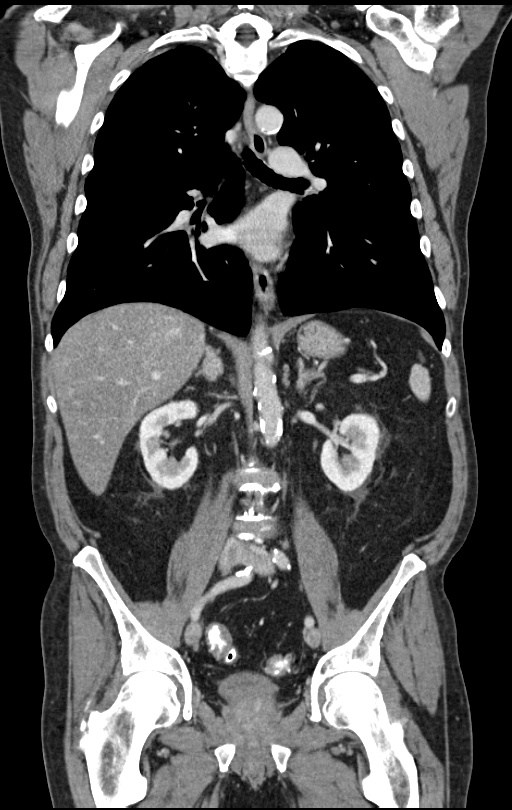

[13 of 46 positions shown; findings below may reference images not displayed]

FINDINGS: CT CHEST FINDINGS

Cardiovascular: Heart size is normal. There is no significant
pericardial fluid, thickening or pericardial calcification. There is
aortic atherosclerosis, as well as atherosclerosis of the great
vessels of the mediastinum and the coronary arteries, including
calcified atherosclerotic plaque in the left main, left anterior
descending, left circumflex and right coronary arteries.

Mediastinum/Nodes: No pathologically enlarged mediastinal or hilar
lymph nodes. Esophagus is unremarkable in appearance. No axillary
lymphadenopathy.

Lungs/Pleura: No suspicious appearing pulmonary nodules or masses.
There are innumerable tiny 1-2 mm pulmonary nodules scattered
throughout the periphery of the lungs bilaterally. No acute
consolidative airspace disease. No pleural effusions. Mild diffuse
bronchial wall thickening with mild paraseptal emphysema, most
apparent the lung apices.

Musculoskeletal: Several old healed right-sided posterior rib
fractures. There are no aggressive appearing lytic or blastic
lesions noted in the visualized portions of the skeleton.

CT ABDOMEN PELVIS FINDINGS

Hepatobiliary: Mild diffuse low attenuation throughout the hepatic
parenchyma, compatible with a background of hepatic steatosis. No
cystic or solid hepatic lesions. No intra or extrahepatic biliary
ductal dilatation. Gallbladder is normal in appearance.

Pancreas: No pancreatic mass. No pancreatic ductal dilatation. No
pancreatic or peripancreatic fluid or inflammatory changes.

Spleen: Unremarkable.

Adrenals/Urinary Tract: No suspicious renal lesions. No
hydroureteronephrosis. Bilateral perinephric stranding is noted
(nonspecific). Urinary bladder is normal in appearance. Bilateral
adrenal glands are unremarkable in appearance.

Stomach/Bowel: The appearance of the stomach is normal. There is no
pathologic dilatation of small bowel or colon. Normal appendix.
Numerous colonic diverticulae are noted, without surrounding
inflammatory changes to suggest an acute diverticulitis at this
time.

Vascular/Lymphatic: Aortic atherosclerosis, without evidence of
aneurysm or dissection in the abdominal or pelvic vasculature. No
lymphadenopathy noted in the abdomen or pelvis.

Reproductive: Prostate gland and seminal vesicles are unremarkable
in appearance.

Other: No significant volume of ascites.  No pneumoperitoneum.

Musculoskeletal: There are no aggressive appearing lytic or blastic
lesions noted in the visualized portions of the skeleton.
IMPRESSION: 1. No acute findings in the chest, abdomen or pelvis to account for
the patient's symptoms.
2. Hepatic steatosis.
3. Aortic atherosclerosis, in addition to left main and 3 vessel
coronary artery disease. Please note that although the presence of
coronary artery calcium documents the presence of coronary artery
disease, the severity of this disease and any potential stenosis
cannot be assessed on this non-gated CT examination. Assessment for
potential risk factor modification, dietary therapy or pharmacologic
therapy may be warranted, if clinically indicated.
4. Colonic diverticulosis without evidence of acute diverticulitis
at this time.
5. Normal appendix.
6. Innumerable tiny 1-2 mm pulmonary nodules scattered throughout
the periphery of the lungs bilaterally, concerning for a smoking
related disease such as respiratory bronchiolitis (MIJANGOS), or if the
patient is symptomatic, respiratory bronchiolitis interstitial lung
disease (MIJANGOS -ILD).
7. Mild diffuse bronchial wall thickening with mild paraseptal
emphysema; imaging findings suggestive of underlying COPD.
8. The additional incidental findings, as above.

## 2018-03-13 ENCOUNTER — Telehealth: Payer: Self-pay | Admitting: Internal Medicine

## 2018-03-13 NOTE — Telephone Encounter (Signed)
New Message   Spoke with patients wife in reference to patients lab results from Jan. She says that she was advised that is blood count was high. She did reach out to the oncologist and never heard back. She wants to know can he come in and have lab work done prior to his appointment or does it need to be done during his appointment on the 15th. She states that if you call and she does not answer that a detailed vm can be left of what needs to be done. Please call to discuss.

## 2018-03-15 NOTE — Telephone Encounter (Signed)
Spoke to pt wife re: labwork and his upcoming appt.. I advised her that we will wait until he is sen that dya and I asked of her had been followed by a PMD for his Hgb/cbc results back in 06/2017 and she reported that he has not. He had been going to [facility] but she has not been happy there. I gave her the [facility] number to call to get him a new PMD in the [facility] system and she says she will call today. He is also being seen by a Rheumatologist on [place] on Oct 4. Will keep his appt with [place] on 04/02/18.

## 2018-04-02 ENCOUNTER — Ambulatory Visit: Payer: Medicare Other | Admitting: Internal Medicine

## 2018-04-02 ENCOUNTER — Encounter: Payer: Self-pay | Admitting: Internal Medicine

## 2018-04-02 VITALS — BP 104/80 | HR 63 | Ht 72.0 in | Wt 177.0 lb

## 2018-04-02 DIAGNOSIS — F1010 Alcohol abuse, uncomplicated: Secondary | ICD-10-CM | POA: Diagnosis not present

## 2018-04-02 DIAGNOSIS — I480 Paroxysmal atrial fibrillation: Secondary | ICD-10-CM | POA: Diagnosis not present

## 2018-04-02 DIAGNOSIS — Z720 Tobacco use: Secondary | ICD-10-CM | POA: Diagnosis not present

## 2018-04-02 DIAGNOSIS — I10 Essential (primary) hypertension: Secondary | ICD-10-CM

## 2018-04-02 LAB — BASIC METABOLIC PANEL
BUN/Creatinine Ratio: 7 — ABNORMAL LOW (ref 10–24)
BUN: 8 mg/dL (ref 8–27)
CHLORIDE: 93 mmol/L — AB (ref 96–106)
CO2: 24 mmol/L (ref 20–29)
CREATININE: 1.1 mg/dL (ref 0.76–1.27)
Calcium: 9.6 mg/dL (ref 8.6–10.2)
GFR calc Af Amer: 80 mL/min/{1.73_m2} (ref >59)
GFR calc non Af Amer: 69 mL/min/{1.73_m2} (ref >59)
GLUCOSE: 103 mg/dL — AB (ref 65–99)
Potassium: 4.3 mmol/L (ref 3.5–5.2)
Sodium: 136 mmol/L (ref 134–144)

## 2018-04-02 LAB — CBC
HEMOGLOBIN: 20.6 g/dL — AB (ref 13.0–17.7)
Hematocrit: 55.8 % — ABNORMAL HIGH (ref 37.5–51.0)
MCH: 39.1 pg — AB (ref 26.6–33.0)
MCHC: 36.9 g/dL — ABNORMAL HIGH (ref 31.5–35.7)
MCV: 106 fL — ABNORMAL HIGH (ref 79–97)
PLATELETS: 258 10*3/uL (ref 150–450)
RBC: 5.27 x10E6/uL (ref 4.14–5.80)
RDW: 13.2 % (ref 12.3–15.4)
WBC: 11.9 10*3/uL — ABNORMAL HIGH (ref 3.4–10.8)

## 2018-04-02 NOTE — Progress Notes (Signed)
Cardiology Office Note   Date:  04/02/2018   ID:  Travis Conrad, DOB 23-Jan-1951, MRN [id number]  PCP:  [name], MD  Cardiologist:   [name], MD   Pt presents for f/u of PAF   History of Present Illness: Travis Conrad is a 67 y.o. male with a history of PAF and HTN  I saw him in Feb 2018   The pt says he has "attacks' of aafib that come and go   No dizziness   Last about 3 to 4 hours   Says he feels tired.    Has been on sotalol   Not eager th change  Denies CP    He continues to drink, continues to smoke             Current Meds  Medication Sig  . diltiazem (DILT-XR) 240 MG 24 hr capsule Take 1 capsule (240 mg total) by mouth daily with supper. Please keep upcoming appt in December with [name] for future refills. Thank you  . losartan-hydrochlorothiazide (HYZAAR) 100-25 MG tablet TAKE 1/2 TABLET BY MOUTH DAILY  . sotalol (BETAPACE) 120 MG tablet TAKE 1 TABLET BY MOUTH EVERY 12 HOURS  . XARELTO 20 MG TABS tablet TAKE 1 TABLET BY MOUTH EVERY DAY WITH DINNER     Allergies:   Patient has no known allergies.   Past Medical History:  Diagnosis Date  . Atrial fibrillation ([place])   . High cholesterol    "RX made groin hurt" (08/14/2016)  . Hypertension     Past Surgical History:  Procedure Laterality Date  . ANKLE FRACTURE SURGERY Left ~ 2012   "put rod and pins in"  . COLONOSCOPY W/ BIOPSIES AND POLYPECTOMY  ~ 2000  . FRACTURE SURGERY    . ORIF FIBULA FRACTURE  06/24/2012   Procedure: OPEN REDUCTION INTERNAL FIXATION (ORIF) FIBULA FRACTURE;  Surgeon: [name], MD;  Location: [place];  Service: Orthopedics;  Laterality: Left;  ORIF LEFT FIBULA,  POSSIBLE REPAIR OF SYNDESMOSIS      Social History:  The patient  reports that he has been smoking cigarettes. He has a 50.00 pack-year smoking history. He has never used smokeless tobacco. He reports that he drinks about 75.0 standard drinks of alcohol per week. He reports that he has current or past drug  history. Drugs: Cocaine and Marijuana.   Family History:  The patient's family history includes Other in his brother.    ROS:  Please see the history of present illness. All other systems are reviewed and  Negative to the above problem except as noted.    PHYSICAL EXAM: VS:  BP 104/80   Pulse 63   Ht 6' (1.829 m)   Wt 177 lb (80.3 kg)   SpO2 94%   BMI 24.01 kg/m   GEN: Well nourished, well developed, in no acute distress  HEENT: normal  Neck: no JVD, carotid bruits, or masses Cardiac: RRR; no murmurs, rubs, or gallops,no edema  Respiratory Moving air.     GI: soft, nontender, nondistended, + BS  No hepatomegaly  MS: no deformity Moving all extremities   Skin: warm and dry, no rash Neuro:  Strength and sensation are intact Psych: euthymic mood, full affect   EKG:  EKG is not ordered today.    Lipid Panel    Component Value Date/Time   CHOL 212 (H) 06/04/2015 1022   TRIG 167 (H) 06/04/2015 1022   HDL 43 06/04/2015 1022   CHOLHDL 4.9  06/04/2015 1022   VLDL 33 (H) 06/04/2015 1022   LDLCALC 136 (H) 06/04/2015 1022      Wt Readings from Last 3 Encounters:  04/02/18 177 lb (80.3 kg)  07/12/17 180 lb 9.6 oz (81.9 kg)  02/14/17 174 lb 6.4 oz (79.1 kg)      ASSESSMENT AND PLAN:  1   PAF  Keep on same regimen   Does not want to switch Conitnue Xarelot   Check CBC an d BMET   2  HTN  BP is good   He denies dizziness   If develops would back down on meds some    3.  Tob  Counselled on cessation.    4  EtOH abuse   Counselled on quitting      F/U in 6 to 8 months    Current medicines are reviewed at length with the patient today.  The patient does not have concerns regarding medicines.  Signed, [name], MD  04/02/2018 10:14 AM    [facility], [place], [place]  [address] Phone: [phone]; Fax: [phone]

## 2018-04-02 NOTE — Patient Instructions (Signed)
Medication Instructions:  Your physician recommends that you continue on your current medications as directed. Please refer to the Current Medication list given to you today.  If you need a refill on your cardiac medications before your next appointment, please call your pharmacy.   Lab work: Your physician recommends that you return for lab work in:  Today (BMET, CBC)  If you have labs (blood work) drawn today and your tests are completely normal, you will receive your results only by: [name] MyChart Message (if you have MyChart) OR . A paper copy in the mail If you have any lab test that is abnormal or we need to change your treatment, we will call you to review the results.  Testing/Procedures: none  Follow-Up: At [facility], you and your health needs are our priority.  As part of our continuing mission to provide you with exceptional heart care, we have created designated Provider Care Teams.  These Care Teams include your primary Cardiologist (physician) and Advanced Practice Providers (APPs -  Physician Assistants and Nurse Practitioners) who all work together to provide you with the care you need, when you need it. You will need a follow up appointment in:  7 months.  Please call our office 2 months in advance to schedule this appointment.  You may see [name], MD or one of the following Advanced Practice Providers on your designated Care Team: Richardson Dopp, PA-C [place], [place] . Daune Perch, NP  Any Other Special Instructions Will Be Listed Below (If Applicable). none

## 2018-05-18 ENCOUNTER — Other Ambulatory Visit: Payer: Self-pay | Admitting: Internal Medicine

## 2018-05-18 DIAGNOSIS — E785 Hyperlipidemia, unspecified: Secondary | ICD-10-CM

## 2018-05-18 DIAGNOSIS — I10 Essential (primary) hypertension: Secondary | ICD-10-CM

## 2018-05-21 ENCOUNTER — Other Ambulatory Visit: Payer: Self-pay | Admitting: Internal Medicine

## 2018-05-21 DIAGNOSIS — E785 Hyperlipidemia, unspecified: Secondary | ICD-10-CM

## 2018-05-21 DIAGNOSIS — I10 Essential (primary) hypertension: Secondary | ICD-10-CM

## 2018-05-21 MED ORDER — LOSARTAN POTASSIUM-HCTZ 100-25 MG PO TABS: 0.5000 | ORAL_TABLET | Freq: Every day | ORAL | 3 refills | Status: DC

## 2018-05-21 NOTE — Telephone Encounter (Signed)
Outpatient Medication Detail    Disp Refills Start End   losartan-hydrochlorothiazide (HYZAAR) 100-25 MG tablet 45 tablet 3 05/21/2018    Sig - Route: Take 0.5 tablets by mouth daily. - Oral   Sent to pharmacy as: losartan-hydrochlorothiazide (HYZAAR) 100-25 MG tablet   E-Prescribing Status: Receipt confirmed by pharmacy (05/21/2018 12:04 PM EST)   Associated Diagnoses   Dyslipidemia     Essential hypertension     [place], [place] - [address] [place] [address] [place] [address]

## 2018-06-03 ENCOUNTER — Ambulatory Visit: Payer: Medicare Other | Admitting: Internal Medicine

## 2018-06-05 ENCOUNTER — Other Ambulatory Visit: Payer: Self-pay | Admitting: Internal Medicine

## 2018-07-24 ENCOUNTER — Other Ambulatory Visit: Payer: Self-pay | Admitting: Internal Medicine

## 2018-07-24 NOTE — Telephone Encounter (Signed)
Pt last saw Dr Ross 04/02/18, last labs 04/02/18 Creat 1.10, age 67, weight 80.3kg, CrCl 73, based on CrCl pt is on appropriate dosage of Xarelto 20mg QD.  Will refill rx.  

## 2018-08-10 ENCOUNTER — Other Ambulatory Visit: Payer: Self-pay | Admitting: Internal Medicine

## 2018-10-01 ENCOUNTER — Telehealth: Payer: Self-pay

## 2018-10-01 NOTE — Telephone Encounter (Signed)
Pt not interested in OV at this time. States there have been no changes in his medical condition. Pt appt rescheduled for Aug 20th.      Primary Cardiologist:  [name], MD   Patient contacted.  History reviewed.  No symptoms to suggest any unstable cardiac conditions.  Based on discussion, with current pandemic situation, we will be postponing this appointment for Travis Conrad with a plan for f/u in 12 wks or sooner if feasible/necessary.  If symptoms change, he has been instructed to contact our office.   Routing to C19 CANCEL pool for tracking (P CV DIV CV19 CANCEL - reason for visit "other.") and assigning priority (1 = 4-6 wks, 2 = 6-12 wks, 3 = >12 wks).   Wilma Flavin, RN  10/01/2018 10:40 AM         .

## 2018-10-07 ENCOUNTER — Ambulatory Visit: Payer: Medicare Other | Admitting: Internal Medicine

## 2019-01-29 ENCOUNTER — Other Ambulatory Visit: Payer: Self-pay | Admitting: Internal Medicine

## 2019-01-29 NOTE — Telephone Encounter (Signed)
Pt last saw Dr [name] 04/02/18, last labs 04/02/18 Creat 1.10, age 68, weight 80.3kg, CrCl 73, based on CrCl pt is on appropriate dosage of Xarelto 20mg  QD.  Will refill rx.

## 2019-02-03 ENCOUNTER — Telehealth: Payer: Self-pay | Admitting: Internal Medicine

## 2019-02-03 MED ORDER — DILTIAZEM HCL ER 240 MG PO CP24: ORAL_CAPSULE | ORAL | 1 refills | Status: DC

## 2019-02-03 MED ORDER — SOTALOL HCL 120 MG PO TABS: 120.0000 mg | ORAL_TABLET | Freq: Two times a day (BID) | ORAL | 1 refills | Status: DC

## 2019-02-03 NOTE — Telephone Encounter (Signed)
Offered video or telephone visit. Pt 's wife reports that he continues to have almost daily bouts of afib for approx 5-6 hours a day. His appetite is fair, sometimes poor.  She said patient is afraid to come into office for appointment because he might get covid 19. I offered virtual, they do not have smartphone and she feels that he will just tell [name] everything is fine and he would best benefit from face to face visit.  She rescheduled appointment for November. Refilled diltiazem and sotalol.

## 2019-02-03 NOTE — Telephone Encounter (Signed)
  Wife is calling because patient has concerns about coming into the office with the Corona virus. They would like to know if he reschedules for a later date would Dr [name] still fill his medications.

## 2019-02-03 NOTE — Progress Notes (Deleted)
Cardiology Office Note   Date:  02/03/2019   ID:  Travis Conrad, Travis Conrad 23-Mar-1951, MRN [id number]  PCP:  [name], MD  Cardiologist:   [name], MD   Pt presents for f/u of PAF   History of Present Illness: Travis Conrad is a 68 y.o. male with a history of PAF and HTN  I saw him in Feb 2018   The pt says he has "attacks' of aafib that come and go   No dizziness   Last about 3 to 4 hours   Says he feels tired.    Has been on sotalol   Not eager th change  Denies CP    He continues to drink, continues to smoke             No outpatient medications have been marked as taking for the 02/06/19 encounter (Appointment) with Fay Records, MD.     Allergies:   Patient has no known allergies.   Past Medical History:  Diagnosis Date  . Atrial fibrillation ([place])   . High cholesterol    "RX made groin hurt" (08/14/2016)  . Hypertension     Past Surgical History:  Procedure Laterality Date  . ANKLE FRACTURE SURGERY Left ~ 2012   "put rod and pins in"  . COLONOSCOPY W/ BIOPSIES AND POLYPECTOMY  ~ 2000  . FRACTURE SURGERY    . ORIF FIBULA FRACTURE  06/24/2012   Procedure: OPEN REDUCTION INTERNAL FIXATION (ORIF) FIBULA FRACTURE;  Surgeon: [name], MD;  Location: [facility];  Service: Orthopedics;  Laterality: Left;  ORIF LEFT FIBULA,  POSSIBLE REPAIR OF SYNDESMOSIS      Social History:  The patient  reports that he has been smoking cigarettes. He has a 50.00 pack-year smoking history. He has never used smokeless tobacco. He reports current alcohol use of about 75.0 standard drinks of alcohol per week. He reports current drug use. Drugs: Cocaine and Marijuana.   Family History:  The patient's family history includes Other in his brother.    ROS:  Please see the history of present illness. All other systems are reviewed and  Negative to the above problem except as noted.    PHYSICAL EXAM: VS:  There were no vitals taken for this visit.  GEN: Well nourished, well  developed, in no acute distress  HEENT: normal  Neck: no JVD, carotid bruits, or masses Cardiac: RRR; no murmurs, rubs, or gallops,no edema  Respiratory Moving air.     GI: soft, nontender, nondistended, + BS  No hepatomegaly  MS: no deformity Moving all extremities   Skin: warm and dry, no rash Neuro:  Strength and sensation are intact Psych: euthymic mood, full affect   EKG:  EKG is not ordered today.    Lipid Panel    Component Value Date/Time   CHOL 212 (H) 06/04/2015 1022   TRIG 167 (H) 06/04/2015 1022   HDL 43 06/04/2015 1022   CHOLHDL 4.9 06/04/2015 1022   VLDL 33 (H) 06/04/2015 1022   LDLCALC 136 (H) 06/04/2015 1022      Wt Readings from Last 3 Encounters:  04/02/18 177 lb (80.3 kg)  07/12/17 180 lb 9.6 oz (81.9 kg)  02/14/17 174 lb 6.4 oz (79.1 kg)      ASSESSMENT AND PLAN:  1   PAF  Keep on same regimen   Does not want to switch Conitnue Xarelot   Check CBC an d BMET   2  HTN  BP  is good   He denies dizziness   If develops would back down on meds some    3.  Tob  Counselled on cessation.    4  EtOH abuse   Counselled on quitting      F/U in 6 to 8 months    Current medicines are reviewed at length with the patient today.  The patient does not have concerns regarding medicines.  Signed, [name], MD  02/03/2019 9:42 AM    [facility], [place], [place]  [address] Phone: [phone]; Fax: [phone]

## 2019-02-06 ENCOUNTER — Ambulatory Visit: Payer: Medicare Other | Admitting: Internal Medicine

## 2019-02-23 ENCOUNTER — Other Ambulatory Visit: Payer: Self-pay | Admitting: Internal Medicine

## 2019-02-27 ENCOUNTER — Other Ambulatory Visit: Payer: Self-pay | Admitting: Internal Medicine

## 2019-03-07 ENCOUNTER — Other Ambulatory Visit: Payer: Self-pay | Admitting: Internal Medicine

## 2019-05-01 NOTE — Progress Notes (Signed)
Cardiology Office Note   Date:  05/02/2019   ID:  Travis Conrad [birth year], MRN [id number]  PCP:  [name], MD  Cardiologist:   [name], MD   Pt presents for f/u of PAF   History of Present Illness: Travis LEATHEM is a 68 y.o. male with a history of PAF and HTN   I saw the pt in Oct 2019    The patient returns today for follow-up.  His wife is on the phone during this visit.  Patient says he has had more spells of what he thinks are atrial fibrillation he gets very jittery during this time heart racing.  The wife says he has been more confused at times is hot cold.  He has not been out with with the given pandemic much.  The patient denies chest pain.  Breathing is okay.  Wife reports that his appetite is way down though the patient says he is eating.     Current Meds  Medication Sig  . diltiazem (DILT-XR) 240 MG 24 hr capsule Take 1 capsule (240 mg total) by mouth daily with supper. TAKE 1 CAPSULE BY MOUTH DAILY WITH SUPPER  . losartan-hydrochlorothiazide (HYZAAR) 100-25 MG tablet Take 0.5 tablets by mouth daily.  . sotalol (BETAPACE) 120 MG tablet Take 1 tablet (120 mg total) by mouth every 12 (twelve) hours.  Alveda Reasons 20 MG TABS tablet TAKE 1 TABLET BY MOUTH EVERY DAY WITH DINNER     Allergies:   Patient has no known allergies.   Past Medical History:  Diagnosis Date  . Atrial fibrillation ([place])   . High cholesterol    "RX made groin hurt" (08/14/2016)  . Hypertension     Past Surgical History:  Procedure Laterality Date  . ANKLE FRACTURE SURGERY Left ~ 2012   "put rod and pins in"  . COLONOSCOPY W/ BIOPSIES AND POLYPECTOMY  ~ 2000  . FRACTURE SURGERY    . ORIF FIBULA FRACTURE  06/24/2012   Procedure: OPEN REDUCTION INTERNAL FIXATION (ORIF) FIBULA FRACTURE;  Surgeon: [name], MD;  Location: [facility];  Service: Orthopedics;  Laterality: Left;  ORIF LEFT FIBULA,  POSSIBLE REPAIR OF SYNDESMOSIS      Social History:  The patient  reports that he  has been smoking cigarettes. He has a 50.00 pack-year smoking history. He has never used smokeless tobacco. He reports current alcohol use of about 75.0 standard drinks of alcohol per week. He reports current drug use. Drugs: Cocaine and Marijuana.   Family History:  The patient's family history includes Other in his brother.    ROS:  Please see the history of present illness. All other systems are reviewed and  Negative to the above problem except as noted.    PHYSICAL EXAM: VS:  BP 120/74   Pulse (!) 57   Ht 6' (1.829 m)   Wt 175 lb (79.4 kg)   BMI 23.73 kg/m   GEN: Well nourished, well developed, in no acute distress  HEENT: normal  Neck: no JVD, carotid bruits, or masses Cardiac: RRR; no murmurs, rubs, or gallops,no edema  Respiratory Moving air.     GI: soft, nontender, nondistended, + BS  No hepatomegaly  MS: no deformity Moving all extremities   Skin: warm and dry, no rash Neuro:  Strength and sensation are intact Psych: euthymic mood, full affect   EKG:  EKG is ordered today. SB 57 bpm      Lipid Panel    Component  Value Date/Time   CHOL 212 (H) 06/04/2015 1022   TRIG 167 (H) 06/04/2015 1022   HDL 43 06/04/2015 1022   CHOLHDL 4.9 06/04/2015 1022   VLDL 33 (H) 06/04/2015 1022   LDLCALC 136 (H) 06/04/2015 1022      Wt Readings from Last 3 Encounters:  05/02/19 175 lb (79.4 kg)  04/02/18 177 lb (80.3 kg)  07/12/17 180 lb 9.6 oz (81.9 kg)      ASSESSMENT AND PLAN:  1   PAF patient is in sinus rhythm now.  Suspicious that he is having more atrial fibrillation.  I would set him up for an event monitor to document before changing any medicines.  We will check labs today.  2  HTN blood pressure is good today we will check labs.  3  CAD patient has evidence of CAD on CT scan done back in 2017.  Aortic atherosclerosis also noted.  He is asymptomatic.  Would get lipids today.  He is on Xarelto.  4.  Abnormal chest CT.  Patient had evidence in 2017 of small  pulmonary nodules at the periphery question inflammation.  Bronchial thickening.  We will set him up for repeat CT to compare.   3.  Tob  Counselled on cessation.  He smokes a pack every few days  4  EtOH abuse   Counselled on quitting  He is drinkng a jug every few days     F/u will be based on test results   For now 1 year but probabbly sooner     Current medicines are reviewed at length with the patient today.  The patient does not have concerns regarding medicines.  Signed, [name], MD  05/02/2019 8:31 AM    [facility], [place], [place]  [address] Phone: [phone]; Fax: [phone]

## 2019-05-02 ENCOUNTER — Ambulatory Visit: Payer: Medicare Other | Admitting: Internal Medicine

## 2019-05-02 ENCOUNTER — Encounter: Payer: Self-pay | Admitting: Internal Medicine

## 2019-05-02 ENCOUNTER — Other Ambulatory Visit: Payer: Self-pay

## 2019-05-02 ENCOUNTER — Telehealth: Payer: Self-pay

## 2019-05-02 VITALS — BP 120/74 | HR 57 | Ht 72.0 in | Wt 175.0 lb

## 2019-05-02 DIAGNOSIS — R000 Tachycardia, unspecified: Secondary | ICD-10-CM | POA: Diagnosis not present

## 2019-05-02 DIAGNOSIS — I480 Paroxysmal atrial fibrillation: Secondary | ICD-10-CM | POA: Diagnosis not present

## 2019-05-02 DIAGNOSIS — E7800 Pure hypercholesterolemia, unspecified: Secondary | ICD-10-CM | POA: Diagnosis not present

## 2019-05-02 NOTE — Telephone Encounter (Signed)
30 day Event monitor ordered.

## 2019-05-02 NOTE — Patient Instructions (Signed)
Medication Instructions:  No changes *If you need a refill on your cardiac medications before your next appointment, please call your pharmacy*  Lab Work: Today: cbc, tsh, bmet, lipids If you have labs (blood work) drawn today and your tests are completely normal, you will receive your results only by: [name] MyChart Message (if you have MyChart) OR . A paper copy in the mail If you have any lab test that is abnormal or we need to change your treatment, we will call you to review the results.  Testing/Procedures: Your physician has recommended that you wear an event monitor. Event monitors are medical devices that record the heart's electrical activity. Doctors most often [place] these monitors to diagnose arrhythmias. Arrhythmias are problems with the speed or rhythm of the heartbeat. The monitor is a small, portable device. You can wear one while you do your normal daily activities. This is usually used to diagnose what is causing palpitations/syncope (passing out).    Follow-Up: At [facility], you and your health needs are our priority.  As part of our continuing mission to provide you with exceptional heart care, we have created designated Provider Care Teams.  These Care Teams include your primary Cardiologist (physician) and Advanced Practice Providers (APPs -  Physician Assistants and Nurse Practitioners) who all work together to provide you with the care you need, when you need it.  Your next appointment:   12 months The format for your next appointment:   In Person  Provider:   [name], MD  Other Instructions

## 2019-05-03 LAB — CBC
Hematocrit: 52.6 % — ABNORMAL HIGH (ref 37.5–51.0)
Hemoglobin: 19.6 g/dL — ABNORMAL HIGH (ref 13.0–17.7)
MCH: 39.4 pg — ABNORMAL HIGH (ref 26.6–33.0)
MCHC: 37.3 g/dL — ABNORMAL HIGH (ref 31.5–35.7)
MCV: 106 fL — ABNORMAL HIGH (ref 79–97)
Platelets: 365 10*3/uL (ref 150–450)
RBC: 4.97 x10E6/uL (ref 4.14–5.80)
RDW: 12.5 % (ref 11.6–15.4)
WBC: 10 10*3/uL (ref 3.4–10.8)

## 2019-05-03 LAB — BASIC METABOLIC PANEL
BUN/Creatinine Ratio: 8 — ABNORMAL LOW (ref 10–24)
BUN: 8 mg/dL (ref 8–27)
CO2: 27 mmol/L (ref 20–29)
Calcium: 9.9 mg/dL (ref 8.6–10.2)
Chloride: 96 mmol/L (ref 96–106)
Creatinine, Ser: 1.01 mg/dL (ref 0.76–1.27)
GFR calc Af Amer: 88 mL/min/{1.73_m2} (ref >59)
GFR calc non Af Amer: 76 mL/min/{1.73_m2} (ref >59)
Glucose: 98 mg/dL (ref 65–99)
Potassium: 3.8 mmol/L (ref 3.5–5.2)
Sodium: 138 mmol/L (ref 134–144)

## 2019-05-03 LAB — LIPID PANEL
Chol/HDL Ratio: 5.6 ratio — ABNORMAL HIGH (ref 0.0–5.0)
Cholesterol, Total: 211 mg/dL — ABNORMAL HIGH (ref 100–199)
HDL: 38 mg/dL — ABNORMAL LOW (ref >39)
LDL Chol Calc (NIH): 140 mg/dL — ABNORMAL HIGH (ref 0–99)
Triglycerides: 183 mg/dL — ABNORMAL HIGH (ref 0–149)
VLDL Cholesterol Cal: 33 mg/dL (ref 5–40)

## 2019-05-03 LAB — TSH: TSH: 2.37 u[IU]/mL (ref 0.450–4.500)

## 2019-05-05 NOTE — Addendum Note (Signed)
Addended by: Patterson Hammersmith A on: 05/05/2019 12:06 PM   Modules accepted: Orders

## 2019-05-06 ENCOUNTER — Other Ambulatory Visit: Payer: Self-pay | Admitting: *Deleted

## 2019-05-06 ENCOUNTER — Encounter: Payer: Self-pay | Admitting: *Deleted

## 2019-05-06 DIAGNOSIS — E7800 Pure hypercholesterolemia, unspecified: Secondary | ICD-10-CM

## 2019-05-06 MED ORDER — ROSUVASTATIN CALCIUM 20 MG PO TABS: 20.0000 mg | ORAL_TABLET | Freq: Every day | ORAL | 3 refills | Status: DC

## 2019-05-13 ENCOUNTER — Ambulatory Visit (INDEPENDENT_AMBULATORY_CARE_PROVIDER_SITE_OTHER): Payer: Medicare Other

## 2019-05-13 DIAGNOSIS — R000 Tachycardia, unspecified: Secondary | ICD-10-CM | POA: Diagnosis not present

## 2019-05-13 DIAGNOSIS — I480 Paroxysmal atrial fibrillation: Secondary | ICD-10-CM

## 2019-05-14 ENCOUNTER — Telehealth: Payer: Self-pay | Admitting: Internal Medicine

## 2019-05-14 NOTE — Telephone Encounter (Signed)
Received strips from [employer] from event at 4:36 this AM.  Demonstrated Atrial flutter with sinus rhythm.  Reviewed by Dr Daneen Schick.  No new orders given at this time.

## 2019-05-14 NOTE — Telephone Encounter (Signed)
[place] calling from [facility] to report "Atrial Flutter w/ variable conduction with a rate of 60 bpm" on 05/14/19 at 12:56 AM CST. She states that the patient converted into Sinus Bradycardia before the end of the recording. She states that they have faxed over a copy of the report. Will wait for fax. Patient is already anticoagulated.

## 2019-05-14 NOTE — Telephone Encounter (Signed)
[facility] is calling with a critical EKG

## 2019-05-16 ENCOUNTER — Other Ambulatory Visit: Payer: Self-pay | Admitting: Internal Medicine

## 2019-05-16 DIAGNOSIS — E785 Hyperlipidemia, unspecified: Secondary | ICD-10-CM

## 2019-05-16 DIAGNOSIS — I10 Essential (primary) hypertension: Secondary | ICD-10-CM

## 2019-06-25 ENCOUNTER — Other Ambulatory Visit: Payer: Self-pay | Admitting: Internal Medicine

## 2019-06-25 DIAGNOSIS — R000 Tachycardia, unspecified: Secondary | ICD-10-CM

## 2019-06-25 DIAGNOSIS — I480 Paroxysmal atrial fibrillation: Secondary | ICD-10-CM

## 2019-07-22 ENCOUNTER — Ambulatory Visit: Payer: Medicare Other | Admitting: Cardiology

## 2019-07-22 ENCOUNTER — Other Ambulatory Visit: Payer: Self-pay

## 2019-07-22 ENCOUNTER — Encounter: Payer: Self-pay | Admitting: Cardiology

## 2019-07-22 VITALS — BP 100/60 | HR 60 | Ht 72.0 in | Wt 174.4 lb

## 2019-07-22 DIAGNOSIS — I480 Paroxysmal atrial fibrillation: Secondary | ICD-10-CM

## 2019-07-22 DIAGNOSIS — Z01812 Encounter for preprocedural laboratory examination: Secondary | ICD-10-CM

## 2019-07-22 DIAGNOSIS — Z79899 Other long term (current) drug therapy: Secondary | ICD-10-CM

## 2019-07-22 NOTE — Progress Notes (Signed)
Electrophysiology Office Note   Date:  07/22/2019   ID:  Travis Conrad 11/22/1950, MRN [id number]  PCP:  [name], MD  Cardiologist:  Travis Conrad Primary Electrophysiologist:  Travis Colquhoun [name], MD    Chief Complaint: AF   History of Present Illness: Travis Conrad is a 69 y.o. male who is being seen today for the evaluation of AF at the request of Travis Conrad. Presenting today for electrophysiology evaluation.  He has a history significant for atrial fibrillation and hypertension.  When he is in atrial fibrillation, he gets jittery and palpitations.  He wore a cardiac monitor that showed both atrial fibrillation and atrial flutter associated with his symptoms.  Today, he denies symptoms of palpitations, chest pain, shortness of breath, orthopnea, PND, lower extremity edema, claudication, presyncope, syncope, bleeding, or neurologic sequela. The patient is tolerating medications without difficulties.  He has been having more symptoms from his atrial fibrillation.  Symptoms include weakness, fatigue, and dizziness.  At times he gets quite dizzy and has to sit down.  This only occurs when he is in atrial fibrillation it does not occur when he is in normal rhythm.   Past Medical History:  Diagnosis Date  . Atrial fibrillation ([place])   . High cholesterol    "RX made groin hurt" (08/14/2016)  . Hypertension    Past Surgical History:  Procedure Laterality Date  . ANKLE FRACTURE SURGERY Left ~ 2012   "put rod and pins in"  . COLONOSCOPY W/ BIOPSIES AND POLYPECTOMY  ~ 2000  . FRACTURE SURGERY    . ORIF FIBULA FRACTURE  06/24/2012   Procedure: OPEN REDUCTION INTERNAL FIXATION (ORIF) FIBULA FRACTURE;  Surgeon: Travis Box, MD;  Location: [facility];  Service: Orthopedics;  Laterality: Left;  ORIF LEFT FIBULA,  POSSIBLE REPAIR OF SYNDESMOSIS      Current Outpatient Medications  Medication Sig Dispense Refill  . diltiazem (DILT-XR) 240 MG 24 hr capsule Take 1 capsule (240 mg total) by  mouth daily with supper. TAKE 1 CAPSULE BY MOUTH DAILY WITH SUPPER 90 capsule 0  . losartan-hydrochlorothiazide (HYZAAR) 100-25 MG tablet TAKE 1/2 TABLET BY MOUTH DAILY 45 tablet 3  . rosuvastatin (CRESTOR) 20 MG tablet Take 1 tablet (20 mg total) by mouth daily. 90 tablet 3  . sotalol (BETAPACE) 120 MG tablet Take 1 tablet (120 mg total) by mouth every 12 (twelve) hours. 180 tablet 1  . XARELTO 20 MG TABS tablet TAKE 1 TABLET BY MOUTH EVERY DAY WITH DINNER 30 tablet 5   No current facility-administered medications for this visit.    Allergies:   Atorvastatin   Social History:  The patient  reports that he has been smoking cigarettes. He has a 50.00 pack-year smoking history. He has never used smokeless tobacco. He reports current alcohol use of about 75.0 standard drinks of alcohol per week. He reports current drug use. Drugs: Cocaine and Marijuana.   Family History:  The patient's family history includes Other in his brother.    ROS:  Please see the history of present illness.   Otherwise, review of systems is positive for none.   All other systems are reviewed and negative.    PHYSICAL EXAM: VS:  BP 100/60   Pulse 60   Ht 6' (1.829 m)   Wt 174 lb 6.4 oz (79.1 kg)   SpO2 96%   BMI 23.65 kg/m  , BMI Body mass index is 23.65 kg/m. GEN: Well nourished, well developed, in no acute  distress  HEENT: normal  Neck: no JVD, carotid bruits, or masses Cardiac: irregular; no murmurs, rubs, or gallops,no edema  Respiratory:  clear to auscultation bilaterally, normal work of breathing GI: soft, nontender, nondistended, + BS MS: no deformity or atrophy  Skin: warm and dry Neuro:  Strength and sensation are intact Psych: euthymic mood, full affect  EKG:  EKG is ordered today. Personal review of the ekg ordered shows atrial fibrillation, rate 60   Recent Labs: 05/02/2019: BUN 8; Creatinine, Ser 1.01; Hemoglobin 19.6; Platelets 365; Potassium 3.8; Sodium 138; TSH 2.370    Lipid Panel       Component Value Date/Time   CHOL 211 (H) 05/02/2019 0838   TRIG 183 (H) 05/02/2019 0838   HDL 38 (L) 05/02/2019 0838   CHOLHDL 5.6 (H) 05/02/2019 0838   CHOLHDL 4.9 06/04/2015 1022   VLDL 33 (H) 06/04/2015 1022   LDLCALC 140 (H) 05/02/2019 0838     Wt Readings from Last 3 Encounters:  07/22/19 174 lb 6.4 oz (79.1 kg)  05/02/19 175 lb (79.4 kg)  04/02/18 177 lb (80.3 kg)      Other studies Reviewed: Additional studies/ records that were reviewed today include: TTE 08/15/16  Review of the above records today demonstrates:  - Left ventricle: The cavity size was normal. Wall thickness was  normal. Systolic function was normal. The estimated ejection  fraction was in the range of 60% to 65%. Left ventricular  diastolic function parameters were normal.  - Aortic valve: Trileaflet. Sclerosis without stenosis. There was  mild regurgitation.  - Mitral valve: Mildly thickened leaflets . There was trivial  regurgitation.  - Left atrium: Severely dilated.  - Right ventricle: The cavity size was normal. Systolic function is  mildly reduced.  - Inferior vena cava: The vessel was normal in size. The  respirophasic diameter changes were in the normal range (= 50%),  consistent with normal central venous pressure.  Cardiac Monitor 06/25/19 personally reviewed Sinus rhythm, atrial fibrllation, atrial flutter.   Symptoms of palpitations not associated with arrhythmias  ASSESSMENT AND PLAN:  1.  Paroxysmal atrial fibrillation/flutter: Currently on diltiazem, sotalol, and Xarelto.  CHA2DS2-VASc of at least 2.  He is unfortunately continued to have episodes of atrial fibrillation and flutter.  Due to that, we Murphy Duzan plan for ablation.  Risks and benefits were discussed.  Risks include bleeding, tamponade, heart block, stroke, damage surrounding organs.  He understands these risks and is agreed to the procedure.  2.  Hypertension: Currently well controlled  Case discussed with  referring  Current medicines are reviewed at length with the patient today.   The patient does not have concerns regarding his medicines.  The following changes were made today:  none  Labs/ tests ordered today include:  Orders Placed This Encounter  Procedures  . CT CARDIAC MORPH/PULM VEIN W/CM&W/O CA SCORE  . CT CORONARY FRACTIONAL FLOW RESERVE DATA PREP  . CT CORONARY FRACTIONAL FLOW RESERVE FLUID ANALYSIS  . Basic metabolic panel  . CBC  . Magnesium  . EKG 12-Lead     Disposition:   FU with Devi Hopman 3 months  Signed, Lason Eveland [name], MD  07/22/2019 11:39 AM     [facility] [address] [place] [address] [phone] (office) [phone] (fax)

## 2019-07-22 NOTE — Patient Instructions (Addendum)
Medication Instructions:  Your physician recommends that you continue on your current medications as directed. Please refer to the Current Medication list given to you today.  *If you need a refill on your cardiac medications before your next appointment, please call your pharmacy.  Labwork: You will get lab work today:  BMP, CBC & Magnesium level If you have labs (blood work) drawn today and your tests are completely normal, you will receive your results only by:  [place] (if you have MyChart) OR  A paper copy in the mail If you have any lab test that is abnormal or we need to change your treatment, we will call you to review the results.  Testing/Procedures: Your physician has requested that you have cardiac CT within 7 days prior to your ablation. Cardiac computed tomography (CT) is a painless test that uses an x-ray machine to take clear, detailed pictures of your heart. For further information please visit [email or web address]. Please follow instruction below located under special instructions. You will get a call from our office to schedule the date for this test.  Your physician has recommended that you have an ablation. Catheter ablation is a medical procedure used to treat some cardiac arrhythmias (irregular heartbeats). During catheter ablation, a long, thin, flexible tube is put into a blood vessel in your groin (upper thigh), or neck. This tube is called an ablation catheter. It is then guided to your heart through the blood vessel. Radio frequency waves destroy small areas of heart tissue where abnormal heartbeats may cause an arrhythmia to start. Please see the instructions below located under special instructions  Follow-Up: Your physician recommends that you schedule a follow-up appointment in: 4 weeks, after your procedure on 08/20/2019, with Roderic Palau NP in the AFib clinic.  Your physician recommends that you schedule a follow up appointment in: 3 months, after your  procedure on 08/20/2019, with Dr. [name].  Thank you for choosing [facility]!! Trinidad Curet, RN [phone]   Any Other Special Instructions Will Be Listed Below    CT INSTRUCTIONS Your cardiac CT will be scheduled at:  [facility] [address] [place], [place] [address] [phone]  Please arrive at the [facility] main entrance of [facility] at _____________. Please arrive 30-45 minutes prior to test start time. Proceed to the [facility] Radiology Department (first floor) to check-in and test prep.  Please follow these instructions carefully (unless otherwise directed):  Hold all erectile dysfunction medications at least 48 hours prior to test.  On the Night Before the Test: . Be sure to Drink plenty of water. . Do not consume any caffeinated/decaffeinated beverages or chocolate 12 hours prior to your test. . Do not take any antihistamines 12 hours prior to your test.  On the Day of the Test: . Drink plenty of water. Do not drink any water within one hour of the test. . Do not eat any food 4 hours prior to the test. . You may take your regular medications prior to the test.  . Take your Sotalol & Diltiazem the morning of this testing . HOLD Furosemide/Hydrochlorothiazide morning of the test.      After the Test: . Drink plenty of water. . After receiving IV contrast, you may experience a mild flushed feeling. This is normal. . On occasion, you may experience a mild rash up to 24 hours after the test. This is not dangerous. If this occurs, you can take Benadryl 25 mg and increase your  fluid intake. . If you experience trouble breathing, this can be serious. If it is severe call 911 IMMEDIATELY. If it is mild, please call our office.   Please contact the cardiac imaging nurse navigator should you have any questions/concerns Marchia Bond, RN Navigator Cardiac Imaging [facility] [phone] Office  [phone]  Cell        Electrophysiology/Ablation Procedure Instructions   You are scheduled for a(n)  ablation on 08/20/2019 with Dr. Allegra Lai.   1.   Pre procedure testing-             A.  LAB WORK --- On 07/22/19  for your pre procedure blood work.                 B. COVID TEST-- On 08/16/19 @ 11:30 am - You will go to [facility] hospital ([place]) for your Covid testing.   This is a drive thru test site.  There will be multiple testing areas.  Be sure to share with the first checkpoint that you are there for pre-procedure/surgery testing. This will put you into the right (yellow) lane that leads to the PAT testing team. Stay in your car and the nurse team will come to your car to test you.  After you are tested please go home and self quarantine until the day of your procedure.     2. On the day of your procedure 08/20/2019 you will go to [facility] [phone]) at 6:30 am.  Dennis Bast will go to the main entrance [address] [employer]) and enter where the [employer] are.  Your driver will drop you off and you will head down the hallway to ADMITTING.  You may have one support person come in to the hospital with you.  They will be asked to wait in the waiting room.   3.   Do not eat or drink after midnight prior to your procedure.   4.   Do NOT take any medications the morning of your procedure.   5.  Plan for an overnight stay.  If you use your phone frequently bring your phone charger.   6. You will follow up with the AFIB clinic 4 weeks after your procedure.  You will follow up with Dr. [name]  3 months after your procedure.  These appointments will be made for you.   * If you have ANY questions please call the office [phone] and ask for Beza Steppe RN or send me a MyChart message   * Occasionally, EP Studies and ablations can become lengthy.  Please make your family aware of this before your procedure starts.  Average time ranges from 2-8 hours for EP  studies/ablations.  Your physician will call your family after the procedure with the results.                                    Cardiac Ablation Cardiac ablation is a procedure to disable (ablate) a small amount of heart tissue in very specific places. The heart has many electrical connections. Sometimes these connections are abnormal and can cause the heart to beat very fast or irregularly. Ablating some of the problem areas can improve the heart rhythm or return it to normal. Ablation may be done for people who:  Have Wolff-Parkinson-White syndrome.  Have fast heart rhythms (tachycardia).  Have taken medicines  for an abnormal heart rhythm (arrhythmia) that were not effective or caused side effects.  Have a high-risk heartbeat that may be life-threatening. During the procedure, a small incision is made in the neck or the groin, and a long, thin, flexible tube (catheter) is inserted into the incision and moved to the heart. Small devices (electrodes) on the tip of the catheter will send out electrical currents. A type of X-ray (fluoroscopy) will be used to help guide the catheter and to provide images of the heart. Tell a health care provider about:  Any allergies you have.  All medicines you are taking, including vitamins, herbs, eye drops, creams, and over-the-counter medicines.  Any problems you or family members have had with anesthetic medicines.  Any blood disorders you have.  Any surgeries you have had.  Any medical conditions you have, such as kidney failure.  Whether you are pregnant or may be pregnant. What are the risks? Generally, this is a safe procedure. However, problems may occur, including:  Infection.  Bruising and bleeding at the catheter insertion site.  Bleeding into the chest, especially into the sac that surrounds the heart. This is a serious complication.  Stroke or blood clots.  Damage to other structures or organs.  Allergic reaction to medicines  or dyes.  Need for a permanent pacemaker if the normal electrical system is damaged. A pacemaker is a small computer that sends electrical signals to the heart and helps your heart beat normally.  The procedure not being fully effective. This may not be recognized until months later. Repeat ablation procedures are sometimes required. What happens before the procedure?  Follow instructions from your health care provider about eating or drinking restrictions.  Ask your health care provider about: ? Changing or stopping your regular medicines. This is especially important if you are taking diabetes medicines or blood thinners. ? Taking medicines such as aspirin and ibuprofen. These medicines can thin your blood. Do not take these medicines before your procedure if your health care provider instructs you not to.  Plan to have someone take you home from the hospital or clinic.  If you will be going home right after the procedure, plan to have someone with you for 24 hours. What happens during the procedure?  To lower your risk of infection: ? Your health care team will wash or sanitize their hands. ? Your skin will be washed with soap. ? Hair may be removed from the incision area.  An IV tube will be inserted into one of your veins.  You will be given a medicine to help you relax (sedative).  The skin on your neck or groin will be numbed.  An incision will be made in your neck or your groin.  A needle will be inserted through the incision and into a large vein in your neck or groin.  A catheter will be inserted into the needle and moved to your heart.  Dye may be injected through the catheter to help your surgeon see the area of the heart that needs treatment.  Electrical currents will be sent from the catheter to ablate heart tissue in desired areas. There are three types of energy that may be used to ablate heart tissue: ? Heat (radiofrequency energy). ? Laser energy. ? Extreme  cold (cryoablation).  When the necessary tissue has been ablated, the catheter will be removed.  Pressure will be held on the catheter insertion area to prevent excessive bleeding.  A bandage (dressing) will be placed  over the catheter insertion area. The procedure may vary among health care providers and hospitals. What happens after the procedure?  Your blood pressure, heart rate, breathing rate, and blood oxygen level will be monitored until the medicines you were given have worn off.  Your catheter insertion area will be monitored for bleeding. You will need to lie still for a few hours to ensure that you do not bleed from the catheter insertion area.  Do not drive for 24 hours or as long as directed by your health care provider. Summary  Cardiac ablation is a procedure to disable (ablate) a small amount of heart tissue in very specific places. Ablating some of the problem areas can improve the heart rhythm or return it to normal.  During the procedure, electrical currents will be sent from the catheter to ablate heart tissue in desired areas. This information is not intended to replace advice given to you by your health care provider. Make sure you discuss any questions you have with your health care provider. Document Revised: 11/26/2017 Document Reviewed: 04/24/2016 Elsevier Patient Education  [place].

## 2019-07-23 ENCOUNTER — Other Ambulatory Visit: Payer: Self-pay | Admitting: Internal Medicine

## 2019-07-23 LAB — BASIC METABOLIC PANEL
BUN/Creatinine Ratio: 11 (ref 10–24)
BUN: 12 mg/dL (ref 8–27)
CO2: 27 mmol/L (ref 20–29)
Calcium: 9.6 mg/dL (ref 8.6–10.2)
Chloride: 96 mmol/L (ref 96–106)
Creatinine, Ser: 1.07 mg/dL (ref 0.76–1.27)
GFR calc Af Amer: 81 mL/min/{1.73_m2} (ref >59)
GFR calc non Af Amer: 70 mL/min/{1.73_m2} (ref >59)
Glucose: 105 mg/dL — ABNORMAL HIGH (ref 65–99)
Potassium: 3.7 mmol/L (ref 3.5–5.2)
Sodium: 140 mmol/L (ref 134–144)

## 2019-07-23 LAB — CBC
Hematocrit: 43.1 % (ref 37.5–51.0)
Hemoglobin: 15.7 g/dL (ref 13.0–17.7)
MCH: 37.3 pg — ABNORMAL HIGH (ref 26.6–33.0)
MCHC: 36.4 g/dL — ABNORMAL HIGH (ref 31.5–35.7)
MCV: 102 fL — ABNORMAL HIGH (ref 79–97)
Platelets: 314 10*3/uL (ref 150–450)
RBC: 4.21 x10E6/uL (ref 4.14–5.80)
RDW: 12.6 % (ref 11.6–15.4)
WBC: 16.4 10*3/uL — ABNORMAL HIGH (ref 3.4–10.8)

## 2019-07-23 LAB — MAGNESIUM: Magnesium: 2 mg/dL (ref 1.6–2.3)

## 2019-07-23 NOTE — Telephone Encounter (Signed)
Pt last saw Dr [name] 07/22/19, last labs 07/22/19 Creat 1.07, age 69, weight 79.1kg, CrCl 72.9, based on CrCl pt is on appropriate dosage of Xarelto 20mg  QD.  Will refill rx.

## 2019-08-05 ENCOUNTER — Telehealth: Payer: Self-pay | Admitting: *Deleted

## 2019-08-05 ENCOUNTER — Telehealth (HOSPITAL_COMMUNITY): Payer: Self-pay | Admitting: Nurse Practitioner

## 2019-08-05 NOTE — Telephone Encounter (Signed)
Spouse returned call.  She is aware of appt on 09/17/19 with Roderic Palau, NP.

## 2019-08-05 NOTE — Telephone Encounter (Signed)
Called and left message for patient to call back, need to schedule 4 wk f/u after ablation on 08/20/19 by Dr. [name].

## 2019-08-05 NOTE — Telephone Encounter (Signed)
Made aware to arrive at 9:30 am for his ablation on 08/20/19. Wife verbalized understanding and will inform pt

## 2019-08-14 ENCOUNTER — Encounter (HOSPITAL_COMMUNITY): Payer: Self-pay

## 2019-08-14 ENCOUNTER — Telehealth (HOSPITAL_COMMUNITY): Payer: Self-pay | Admitting: Emergency Medicine

## 2019-08-14 NOTE — Telephone Encounter (Signed)
Left message on voicemail with name and callback number Kem Parcher RN Navigator Cardiac Imaging [facility] [phone] Office [phone] Cell  

## 2019-08-15 ENCOUNTER — Other Ambulatory Visit: Payer: Self-pay

## 2019-08-15 ENCOUNTER — Ambulatory Visit (HOSPITAL_COMMUNITY)
Admission: RE | Admit: 2019-08-15 | Discharge: 2019-08-15 | Disposition: A | Discharge status: Home / Self Care | Payer: Medicare Other | Source: Ambulatory Visit | Attending: Cardiology | Admitting: Cardiology

## 2019-08-15 DIAGNOSIS — I480 Paroxysmal atrial fibrillation: Secondary | ICD-10-CM | POA: Diagnosis present

## 2019-08-15 MED ORDER — IOHEXOL 350 MG/ML SOLN
80.0000 mL | Freq: Once | INTRAVENOUS | Status: AC | PRN
  Administered 2019-08-15: 80 mL via INTRAVENOUS

## 2019-08-16 ENCOUNTER — Other Ambulatory Visit (HOSPITAL_COMMUNITY)
Admission: RE | Admit: 2019-08-16 | Discharge: 2019-08-16 | Disposition: A | Discharge status: Home / Self Care | Payer: Medicare Other | Source: Ambulatory Visit | Attending: Cardiology | Admitting: Cardiology

## 2019-08-16 DIAGNOSIS — Z01812 Encounter for preprocedural laboratory examination: Secondary | ICD-10-CM | POA: Insufficient documentation

## 2019-08-16 DIAGNOSIS — Z20822 Contact with and (suspected) exposure to covid-19: Secondary | ICD-10-CM | POA: Insufficient documentation

## 2019-08-16 LAB — SARS CORONAVIRUS 2 (TAT 6-24 HRS): SARS Coronavirus 2: NEGATIVE

## 2019-08-18 ENCOUNTER — Ambulatory Visit (HOSPITAL_COMMUNITY)
Admission: RE | Admit: 2019-08-18 | Discharge: 2019-08-18 | Disposition: A | Discharge status: Home / Self Care | Payer: Medicare Other | Source: Ambulatory Visit | Attending: Cardiology | Admitting: Cardiology

## 2019-08-18 DIAGNOSIS — I480 Paroxysmal atrial fibrillation: Secondary | ICD-10-CM | POA: Insufficient documentation

## 2019-08-19 DIAGNOSIS — I480 Paroxysmal atrial fibrillation: Secondary | ICD-10-CM | POA: Diagnosis not present

## 2019-08-20 ENCOUNTER — Other Ambulatory Visit: Payer: Self-pay

## 2019-08-20 ENCOUNTER — Ambulatory Visit (HOSPITAL_COMMUNITY): Payer: Medicare Other | Admitting: Certified Registered Nurse Anesthetist

## 2019-08-20 ENCOUNTER — Ambulatory Visit (HOSPITAL_COMMUNITY)
Admission: RE | Admit: 2019-08-20 | Discharge: 2019-08-20 | Disposition: A | Discharge status: Home / Self Care | Payer: Medicare Other | Attending: Cardiology | Admitting: Cardiology

## 2019-08-20 ENCOUNTER — Encounter (HOSPITAL_COMMUNITY)
Admission: RE | Disposition: A | Discharge status: Home / Self Care | Payer: Medicare Other | Source: Home / Self Care | Attending: Cardiology

## 2019-08-20 DIAGNOSIS — I4891 Unspecified atrial fibrillation: Secondary | ICD-10-CM | POA: Diagnosis present

## 2019-08-20 DIAGNOSIS — Z01812 Encounter for preprocedural laboratory examination: Secondary | ICD-10-CM | POA: Insufficient documentation

## 2019-08-20 DIAGNOSIS — Z01810 Encounter for preprocedural cardiovascular examination: Secondary | ICD-10-CM | POA: Diagnosis not present

## 2019-08-20 DIAGNOSIS — Z538 Procedure and treatment not carried out for other reasons: Secondary | ICD-10-CM | POA: Insufficient documentation

## 2019-08-20 LAB — CBC
HCT: 41.4 % (ref 39.0–52.0)
Hemoglobin: 15.2 g/dL (ref 13.0–17.0)
MCH: 37.1 pg — ABNORMAL HIGH (ref 26.0–34.0)
MCHC: 36.7 g/dL — ABNORMAL HIGH (ref 30.0–36.0)
MCV: 101 fL — ABNORMAL HIGH (ref 80.0–100.0)
Platelets: 286 10*3/uL (ref 150–400)
RBC: 4.1 MIL/uL — ABNORMAL LOW (ref 4.22–5.81)
RDW: 13 % (ref 11.5–15.5)
WBC: 11 10*3/uL — ABNORMAL HIGH (ref 4.0–10.5)
nRBC: 0 % (ref 0.0–0.2)

## 2019-08-20 LAB — BASIC METABOLIC PANEL
Anion gap: 15 (ref 5–15)
BUN: 12 mg/dL (ref 8–23)
CO2: 26 mmol/L (ref 22–32)
Calcium: 9.2 mg/dL (ref 8.9–10.3)
Chloride: 94 mmol/L — ABNORMAL LOW (ref 98–111)
Creatinine, Ser: 1.11 mg/dL (ref 0.61–1.24)
GFR calc Af Amer: >60 mL/min (ref >60)
GFR calc non Af Amer: >60 mL/min (ref >60)
Glucose, Bld: 95 mg/dL (ref 70–99)
Potassium: 3.1 mmol/L — ABNORMAL LOW (ref 3.5–5.1)
Sodium: 135 mmol/L (ref 135–145)

## 2019-08-20 SURGERY — ATRIAL FIBRILLATION ABLATION
Anesthesia: General

## 2019-08-20 MED ORDER — SODIUM CHLORIDE 0.9 % IV SOLN: INTRAVENOUS | Status: DC

## 2019-08-20 NOTE — Progress Notes (Signed)
Dr [name] updated, will discuss with patient on how to proceed today.

## 2019-08-20 NOTE — H&P (Signed)
Travis Conrad presented to the hospital for atrial fibrillation ablation. He is unsure if he took his Xarelto last night. States that he drank whiskey and thus does not remember. [name] cancel procedure for now and call him back to possibly reschedule.  Allegra Lai, MD

## 2019-08-20 NOTE — Anesthesia Preprocedure Evaluation (Deleted)
Anesthesia Evaluation  Patient identified by MRN, date of birth, ID band Patient awake    Reviewed: Allergy & Precautions, NPO status , Patient's Chart, lab work & pertinent test results, reviewed documented beta blocker date and time   History of Anesthesia Complications Negative for: history of anesthetic complications  Airway Mallampati: II  TM Distance: >3 FB Neck ROM: Full    Dental  (+) Chipped, Dental Advisory Given   Pulmonary Current Smoker and Patient abstained from smoking.,  08/16/2019 SARS coronavirus NEG   breath sounds clear to auscultation       Cardiovascular hypertension, Pt. on medications and Pt. on home beta blockers (-) angina+ dysrhythmias Atrial Fibrillation  Rhythm:Irregular Rate:Normal  '18 ECHO: EF 60% to 65%.mild AI, severely dilated LA, mildly reduced RV function .    Neuro/Psych negative neurological ROS     GI/Hepatic negative GI ROS, (+)     substance abuse  marijuana use,   Endo/Other  negative endocrine ROS  Renal/GU negative Renal ROS     Musculoskeletal   Abdominal   Peds  Hematology Xarelto: cannot remember when he took his last dose   Anesthesia Other Findings   Reproductive/Obstetrics                            Anesthesia Physical Anesthesia Plan  ASA: III  Anesthesia Plan: General   Post-op Pain Management:    Induction: Intravenous  PONV Risk Score and Plan: 1 and Ondansetron  Airway Management Planned: Oral ETT  Additional Equipment:   Intra-op Plan:   Post-operative Plan: Extubation in OR  Informed Consent: I have reviewed the patients History and Physical, chart, labs and discussed the procedure including the risks, benefits and alternatives for the proposed anesthesia with the patient or authorized representative who has indicated his/her understanding and acceptance.     Dental advisory given  Plan Discussed with: CRNA and  Surgeon  Anesthesia Plan Comments:        Anesthesia Quick Evaluation

## 2019-08-20 NOTE — Progress Notes (Signed)
Gwynne Edinger notified. Stat EKG ordered

## 2019-08-20 NOTE — Progress Notes (Signed)
Pt has not taken his Xarelto since 08-18-2019

## 2019-08-20 NOTE — Progress Notes (Signed)
Some discussion among the family as to whether or not pt took meds yesterday. Pt says he cannot be sure.  Dr [name] notified and has accessed pt.  Pt to be rescheduled per Dr [name].

## 2019-08-22 ENCOUNTER — Telehealth: Payer: Self-pay

## 2019-08-22 NOTE — Telephone Encounter (Signed)
The patient has been notified of the result and verbalized understanding.  All questions (if any) were answered. Wilma Flavin, RN 08/22/2019 1:42 PM

## 2019-08-22 NOTE — Telephone Encounter (Signed)
-----   Message from [name], MD sent at 08/22/2019 10:33 AM EST ----- No significant CAD noted on CT

## 2019-09-03 ENCOUNTER — Other Ambulatory Visit: Payer: Self-pay

## 2019-09-03 MED ORDER — SOTALOL HCL 120 MG PO TABS: 120.0000 mg | ORAL_TABLET | Freq: Two times a day (BID) | ORAL | 3 refills | Status: DC

## 2019-09-12 ENCOUNTER — Other Ambulatory Visit (HOSPITAL_COMMUNITY): Payer: Medicare Other

## 2019-09-12 ENCOUNTER — Ambulatory Visit: Payer: Medicare Other | Attending: Internal Medicine

## 2019-09-12 DIAGNOSIS — Z23 Encounter for immunization: Secondary | ICD-10-CM

## 2019-09-12 NOTE — Progress Notes (Signed)
   Covid-19 Vaccination Clinic  Name:  Travis Conrad    MRN: [id number] DOB: 10-28-1950  09/12/2019  [name] was observed post Covid-19 immunization for 15 minutes without incident. He was provided with Vaccine Information Sheet and instruction to access the V-Safe system.   [name] was instructed to call 911 with any severe reactions post vaccine: [name] Difficulty breathing  . Swelling of face and throat  . A fast heartbeat  . A bad rash all over body  . Dizziness and weakness   Immunizations Administered    Name Date Dose VIS Date Route   Pfizer COVID-19 Vaccine 09/12/2019 11:14 AM 0.3 mL 05/30/2019 Intramuscular   Manufacturer: [place]   Lot: [id number]   [place]: [id number]

## 2019-09-17 ENCOUNTER — Ambulatory Visit (HOSPITAL_COMMUNITY): Payer: Medicare Other | Admitting: Nurse Practitioner

## 2019-09-17 ENCOUNTER — Telehealth: Payer: Self-pay | Admitting: *Deleted

## 2019-09-17 DIAGNOSIS — Z01812 Encounter for preprocedural laboratory examination: Secondary | ICD-10-CM

## 2019-09-17 DIAGNOSIS — R931 Abnormal findings on diagnostic imaging of heart and coronary circulation: Secondary | ICD-10-CM

## 2019-09-17 DIAGNOSIS — I480 Paroxysmal atrial fibrillation: Secondary | ICD-10-CM

## 2019-09-17 NOTE — Telephone Encounter (Signed)
-----   Message from [name], MD sent at 08/18/2019  3:40 PM EST ----- CT without LAA thrombus. Needs fasting lipids due to coronary calcium.

## 2019-09-17 NOTE — Telephone Encounter (Signed)
Spoke to pt's wife and informed of recommendation for follow up Lipid profile. Pt will stop by the office 4/13, prior to covid screening, for blood work (bmet/cbc/lipid). Aware he needs to be fasting. Wife will let pt know and agreeable to plan.

## 2019-09-30 ENCOUNTER — Other Ambulatory Visit: Payer: Self-pay

## 2019-09-30 ENCOUNTER — Other Ambulatory Visit: Payer: Medicare Other

## 2019-09-30 ENCOUNTER — Other Ambulatory Visit (HOSPITAL_COMMUNITY)
Admission: RE | Admit: 2019-09-30 | Discharge: 2019-09-30 | Disposition: A | Discharge status: Home / Self Care | Payer: Medicare Other | Source: Ambulatory Visit | Attending: Cardiology | Admitting: Cardiology

## 2019-09-30 DIAGNOSIS — Z01812 Encounter for preprocedural laboratory examination: Secondary | ICD-10-CM | POA: Insufficient documentation

## 2019-09-30 DIAGNOSIS — I480 Paroxysmal atrial fibrillation: Secondary | ICD-10-CM

## 2019-09-30 DIAGNOSIS — Z20822 Contact with and (suspected) exposure to covid-19: Secondary | ICD-10-CM | POA: Diagnosis not present

## 2019-09-30 DIAGNOSIS — R931 Abnormal findings on diagnostic imaging of heart and coronary circulation: Secondary | ICD-10-CM

## 2019-09-30 LAB — BASIC METABOLIC PANEL
BUN/Creatinine Ratio: 8 — ABNORMAL LOW (ref 10–24)
BUN: 11 mg/dL (ref 8–27)
CO2: 31 mmol/L — ABNORMAL HIGH (ref 20–29)
Calcium: 10 mg/dL (ref 8.6–10.2)
Chloride: 95 mmol/L — ABNORMAL LOW (ref 96–106)
Creatinine, Ser: 1.37 mg/dL — ABNORMAL HIGH (ref 0.76–1.27)
GFR calc Af Amer: 60 mL/min/{1.73_m2} (ref >59)
GFR calc non Af Amer: 52 mL/min/{1.73_m2} — ABNORMAL LOW (ref >59)
Glucose: 100 mg/dL — ABNORMAL HIGH (ref 65–99)
Potassium: 3.3 mmol/L — ABNORMAL LOW (ref 3.5–5.2)
Sodium: 134 mmol/L (ref 134–144)

## 2019-09-30 LAB — CBC
Hematocrit: 47.1 % (ref 37.5–51.0)
Hemoglobin: 16.4 g/dL (ref 13.0–17.7)
MCH: 36.2 pg — ABNORMAL HIGH (ref 26.6–33.0)
MCHC: 34.8 g/dL (ref 31.5–35.7)
MCV: 104 fL — ABNORMAL HIGH (ref 79–97)
Platelets: 253 10*3/uL (ref 150–450)
RBC: 4.53 x10E6/uL (ref 4.14–5.80)
RDW: 14.2 % (ref 11.6–15.4)
WBC: 10.8 10*3/uL (ref 3.4–10.8)

## 2019-09-30 LAB — SARS CORONAVIRUS 2 (TAT 6-24 HRS): SARS Coronavirus 2: NEGATIVE

## 2019-09-30 LAB — LIPID PANEL
Chol/HDL Ratio: 2.9 ratio (ref 0.0–5.0)
Cholesterol, Total: 102 mg/dL (ref 100–199)
HDL: 35 mg/dL — ABNORMAL LOW (ref >39)
LDL Chol Calc (NIH): 32 mg/dL (ref 0–99)
Triglycerides: 225 mg/dL — ABNORMAL HIGH (ref 0–149)
VLDL Cholesterol Cal: 35 mg/dL (ref 5–40)

## 2019-10-01 NOTE — Telephone Encounter (Addendum)
Discussed procedure instructions w/ wife. Aware he will have TEE on table prior to ablation. Aware NPO after MN night before procedure. Aware no medications morning of procedure. Aware office will call to arrange post procedure follow up.  Also advised to increase Potassium intake due K+ today 3.3. Will send to Dr. [name] for his FYI. Aware they will probably recheck Friday morning prior to procedure.

## 2019-10-02 NOTE — Progress Notes (Signed)
Instructed patient on the following items: Arrival time 0830 Nothing to eat or drink after midnight No meds AM of procedure Responsible person to drive you home and stay with you for 24 hrs  Have you missed any doses of anti-coagulant on Xarelto- missed no doses

## 2019-10-03 ENCOUNTER — Ambulatory Visit (HOSPITAL_COMMUNITY): Payer: Medicare Other | Admitting: Anesthesiology

## 2019-10-03 ENCOUNTER — Encounter (HOSPITAL_COMMUNITY)
Admission: RE | Disposition: A | Discharge status: Home / Self Care | Payer: Self-pay | Source: Home / Self Care | Attending: Cardiology

## 2019-10-03 ENCOUNTER — Ambulatory Visit (HOSPITAL_BASED_OUTPATIENT_CLINIC_OR_DEPARTMENT_OTHER): Payer: Medicare Other

## 2019-10-03 ENCOUNTER — Ambulatory Visit (HOSPITAL_COMMUNITY)
Admission: RE | Admit: 2019-10-03 | Discharge: 2019-10-03 | Disposition: A | Discharge status: Home / Self Care | Payer: Medicare Other | Attending: Cardiology | Admitting: Cardiology

## 2019-10-03 ENCOUNTER — Other Ambulatory Visit: Payer: Self-pay

## 2019-10-03 DIAGNOSIS — I4892 Unspecified atrial flutter: Secondary | ICD-10-CM | POA: Diagnosis not present

## 2019-10-03 DIAGNOSIS — E7800 Pure hypercholesterolemia, unspecified: Secondary | ICD-10-CM | POA: Insufficient documentation

## 2019-10-03 DIAGNOSIS — I10 Essential (primary) hypertension: Secondary | ICD-10-CM | POA: Insufficient documentation

## 2019-10-03 DIAGNOSIS — F17210 Nicotine dependence, cigarettes, uncomplicated: Secondary | ICD-10-CM | POA: Insufficient documentation

## 2019-10-03 DIAGNOSIS — I480 Paroxysmal atrial fibrillation: Secondary | ICD-10-CM | POA: Diagnosis not present

## 2019-10-03 HISTORY — PX: TEE WITHOUT CARDIOVERSION: SHX5443

## 2019-10-03 HISTORY — PX: ATRIAL FIBRILLATION ABLATION: EP1191

## 2019-10-03 LAB — BASIC METABOLIC PANEL
Anion gap: 11 (ref 5–15)
BUN: 9 mg/dL (ref 8–23)
CO2: 29 mmol/L (ref 22–32)
Calcium: 9.3 mg/dL (ref 8.9–10.3)
Chloride: 97 mmol/L — ABNORMAL LOW (ref 98–111)
Creatinine, Ser: 1.31 mg/dL — ABNORMAL HIGH (ref 0.61–1.24)
GFR calc Af Amer: >60 mL/min (ref >60)
GFR calc non Af Amer: 55 mL/min — ABNORMAL LOW (ref >60)
Glucose, Bld: 96 mg/dL (ref 70–99)
Potassium: 3.3 mmol/L — ABNORMAL LOW (ref 3.5–5.1)
Sodium: 137 mmol/L (ref 135–145)

## 2019-10-03 LAB — ECHO TEE
Height: 72 in
Weight: 2800 oz

## 2019-10-03 LAB — POCT ACTIVATED CLOTTING TIME
Activated Clotting Time: 439 seconds
Activated Clotting Time: 461 seconds

## 2019-10-03 SURGERY — ATRIAL FIBRILLATION ABLATION
Anesthesia: General

## 2019-10-03 MED ORDER — ONDANSETRON HCL 4 MG/2ML IJ SOLN: 4.0000 mg | Freq: Four times a day (QID) | INTRAMUSCULAR | Status: DC | PRN

## 2019-10-03 MED ORDER — HEPARIN SODIUM (PORCINE) 1000 UNIT/ML IJ SOLN
INTRAMUSCULAR | Status: DC | PRN
  Administered 2019-10-03: 1000 [IU] via INTRAVENOUS

## 2019-10-03 MED ORDER — HEPARIN SODIUM (PORCINE) 1000 UNIT/ML IJ SOLN
INTRAMUSCULAR | Status: DC | PRN
  Administered 2019-10-03: 15000 [IU] via INTRAVENOUS

## 2019-10-03 MED ORDER — PROTAMINE SULFATE 10 MG/ML IV SOLN
INTRAVENOUS | Status: DC | PRN
  Administered 2019-10-03: 50 mg via INTRAVENOUS

## 2019-10-03 MED ORDER — PHENYLEPHRINE HCL (PRESSORS) 10 MG/ML IV SOLN
INTRAVENOUS | Status: DC | PRN
  Administered 2019-10-03: 80 ug via INTRAVENOUS
  Administered 2019-10-03: 120 ug via INTRAVENOUS
  Administered 2019-10-03 (×3): 80 ug via INTRAVENOUS
  Administered 2019-10-03: 120 ug via INTRAVENOUS

## 2019-10-03 MED ORDER — PROPOFOL 10 MG/ML IV BOLUS
INTRAVENOUS | Status: DC | PRN
  Administered 2019-10-03: 150 mg via INTRAVENOUS

## 2019-10-03 MED ORDER — HEPARIN SODIUM (PORCINE) 1000 UNIT/ML IJ SOLN
INTRAMUSCULAR | Status: AC
  Filled 2019-10-03: qty 1

## 2019-10-03 MED ORDER — LACTATED RINGERS IV SOLN: INTRAVENOUS | Status: DC | PRN

## 2019-10-03 MED ORDER — SODIUM CHLORIDE 0.9 % IV SOLN: 250.0000 mL | INTRAVENOUS | Status: DC | PRN

## 2019-10-03 MED ORDER — HEPARIN (PORCINE) IN NACL 1000-0.9 UT/500ML-% IV SOLN
INTRAVENOUS | Status: DC | PRN
  Administered 2019-10-03 (×5): 500 mL

## 2019-10-03 MED ORDER — ROCURONIUM BROMIDE 100 MG/10ML IV SOLN
INTRAVENOUS | Status: DC | PRN
  Administered 2019-10-03: 20 mg via INTRAVENOUS
  Administered 2019-10-03: 50 mg via INTRAVENOUS

## 2019-10-03 MED ORDER — ACETAMINOPHEN 325 MG PO TABS: 650.0000 mg | ORAL_TABLET | ORAL | Status: DC | PRN

## 2019-10-03 MED ORDER — DOBUTAMINE IN D5W 4-5 MG/ML-% IV SOLN
INTRAVENOUS | Status: DC | PRN
  Administered 2019-10-03: 20 ug/kg/min via INTRAVENOUS

## 2019-10-03 MED ORDER — SODIUM CHLORIDE 0.9 % IV SOLN: INTRAVENOUS | Status: DC

## 2019-10-03 MED ORDER — HEPARIN (PORCINE) IN NACL 1000-0.9 UT/500ML-% IV SOLN
INTRAVENOUS | Status: AC
  Filled 2019-10-03: qty 500

## 2019-10-03 MED ORDER — DOBUTAMINE IN D5W 4-5 MG/ML-% IV SOLN
INTRAVENOUS | Status: AC
  Filled 2019-10-03: qty 250

## 2019-10-03 MED ORDER — FENTANYL CITRATE (PF) 100 MCG/2ML IJ SOLN
INTRAMUSCULAR | Status: DC | PRN
  Administered 2019-10-03 (×2): 50 ug via INTRAVENOUS

## 2019-10-03 MED ORDER — ACETAMINOPHEN 500 MG PO TABS
1000.0000 mg | ORAL_TABLET | ORAL | Status: AC
  Administered 2019-10-03: 1000 mg via ORAL
  Filled 2019-10-03 (×2): qty 2

## 2019-10-03 MED ORDER — ACETAMINOPHEN 325 MG PO TABS
ORAL_TABLET | ORAL | Status: AC
  Administered 2019-10-03: 650 mg via ORAL
  Filled 2019-10-03: qty 2

## 2019-10-03 MED ORDER — MIDAZOLAM HCL 2 MG/2ML IJ SOLN
INTRAMUSCULAR | Status: DC | PRN
  Administered 2019-10-03: 1 mg via INTRAVENOUS

## 2019-10-03 MED ORDER — PHENYLEPHRINE HCL-NACL 10-0.9 MG/250ML-% IV SOLN
INTRAVENOUS | Status: DC | PRN
  Administered 2019-10-03: 25 ug/min via INTRAVENOUS

## 2019-10-03 MED ORDER — SODIUM CHLORIDE 0.9% FLUSH: 3.0000 mL | INTRAVENOUS | Status: DC | PRN

## 2019-10-03 MED ORDER — LIDOCAINE HCL (CARDIAC) PF 100 MG/5ML IV SOSY
PREFILLED_SYRINGE | INTRAVENOUS | Status: DC | PRN
  Administered 2019-10-03: 80 mg via INTRATRACHEAL

## 2019-10-03 MED ORDER — SUGAMMADEX SODIUM 200 MG/2ML IV SOLN
INTRAVENOUS | Status: DC | PRN
  Administered 2019-10-03: 200 mg via INTRAVENOUS

## 2019-10-03 MED ORDER — ONDANSETRON HCL 4 MG/2ML IJ SOLN
INTRAMUSCULAR | Status: DC | PRN
  Administered 2019-10-03: 4 mg via INTRAVENOUS

## 2019-10-03 MED ORDER — POTASSIUM CHLORIDE CRYS ER 20 MEQ PO TBCR: 40.0000 meq | EXTENDED_RELEASE_TABLET | Freq: Once | ORAL | Status: DC

## 2019-10-03 SURGICAL SUPPLY — 22 items
BAG SNAP BAND KOVER 36X36 (MISCELLANEOUS) ×2 IMPLANT
BLANKET WARM UNDERBOD FULL ACC (MISCELLANEOUS) ×3 IMPLANT
CATH MAPPNG PENTARAY F 2-6-2MM (CATHETERS) IMPLANT
CATH SMTCH THERMOCOOL SF DF (CATHETERS) ×2 IMPLANT
CATH SOUNDSTAR ECO 8FR (CATHETERS) ×2 IMPLANT
CATH WEBSTER BI DIR CS D-F CRV (CATHETERS) ×2 IMPLANT
COVER SWIFTLINK CONNECTOR (BAG) ×3 IMPLANT
DEVICE CLOSURE PERCLS PRGLD 6F (VASCULAR PRODUCTS) IMPLANT
PACK EP LATEX FREE (CUSTOM PROCEDURE TRAY) ×3
PACK EP LF (CUSTOM PROCEDURE TRAY) ×1 IMPLANT
PAD PRO RADIOLUCENT 2001M-C (PAD) ×3 IMPLANT
PATCH CARTO3 (PAD) ×2 IMPLANT
PENTARAY F 2-6-2MM (CATHETERS) ×3
PERCLOSE PROGLIDE 6F (VASCULAR PRODUCTS) ×12
SHEATH BAYLIS SUREFLEX  M 8.5 (SHEATH) ×3
SHEATH BAYLIS SUREFLEX M 8.5 (SHEATH) IMPLANT
SHEATH BAYLIS TRANSSEPTAL 98CM (NEEDLE) ×2 IMPLANT
SHEATH CARTO VIZIGO SM CVD (SHEATH) ×2 IMPLANT
SHEATH PINNACLE 7F 10CM (SHEATH) ×2 IMPLANT
SHEATH PINNACLE 8F 10CM (SHEATH) ×4 IMPLANT
SHEATH PINNACLE 9F 10CM (SHEATH) ×2 IMPLANT
TUBING SMART ABLATE COOLFLOW (TUBING) ×2 IMPLANT

## 2019-10-03 NOTE — Anesthesia Preprocedure Evaluation (Signed)
Anesthesia Evaluation  Patient identified by MRN, date of birth, ID band Patient awake    Reviewed: Allergy & Precautions, NPO status , Patient's Chart, lab work & pertinent test results  Airway Mallampati: II  TM Distance: >3 FB Neck ROM: Full    Dental  (+) Teeth Intact, Dental Advisory Given   Pulmonary Current SmokerPatient did not abstain from smoking.,    Pulmonary exam normal breath sounds clear to auscultation       Cardiovascular hypertension, Pt. on home beta blockers and Pt. on medications + dysrhythmias Atrial Fibrillation  Rhythm:Irregular Rate:Abnormal     Neuro/Psych negative neurological ROS  negative psych ROS   GI/Hepatic negative GI ROS, Neg liver ROS,   Endo/Other  negative endocrine ROS  Renal/GU negative Renal ROS     Musculoskeletal negative musculoskeletal ROS (+)   Abdominal   Peds  Hematology  (+) Blood dyscrasia (Xarelto), ,   Anesthesia Other Findings Day of surgery medications reviewed with the patient.  Reproductive/Obstetrics                             Anesthesia Physical Anesthesia Plan  ASA: III  Anesthesia Plan: General   Post-op Pain Management:    Induction: Intravenous  PONV Risk Score and Plan: 1 and Treatment may vary due to age or medical condition, Dexamethasone and Ondansetron  Airway Management Planned: Oral ETT  Additional Equipment:   Intra-op Plan:   Post-operative Plan: Extubation in OR  Informed Consent: I have reviewed the patients History and Physical, chart, labs and discussed the procedure including the risks, benefits and alternatives for the proposed anesthesia with the patient or authorized representative who has indicated his/her understanding and acceptance.     Dental advisory given  Plan Discussed with: CRNA  Anesthesia Plan Comments:         Anesthesia Quick Evaluation

## 2019-10-03 NOTE — Discharge Instructions (Signed)
Post procedure care instructions No driving for 4 days. No lifting over 5 lbs for 1 week. No vigorous or sexual activity for 1 week. You may return to work/your usual activities on 10/10/2019. Keep procedure site clean & dry. If you notice increased pain, swelling, bleeding or pus, call/return!  You may shower, but no soaking baths/hot tubs/pools for 1 week.  You have an appointment set up with the [facility].  Multiple studies have shown that being followed by a dedicated atrial fibrillation clinic in addition to the standard care you receive from your other physicians improves health. We believe that enrollment in the atrial fibrillation clinic will allow [place] to better care for you.    Cardiac Ablation  Cardiac ablation is a procedure to stop some heart tissue from causing problems. The heart has many electrical connections. Sometimes these connections make the heart beat very fast or irregularly. Removing some problem areas can improve the heart rhythm or make it normal. What happens before the procedure?  Follow instructions from your doctor about what you cannot eat or drink.  Ask your doctor about: ? Changing or stopping your normal medicines. This is important if you take diabetes medicines or blood thinners. ? Taking medicines such as aspirin and ibuprofen. These medicines can thin your blood. Do not take these medicines before your procedure if your doctor tells you not to.  Plan to have someone take you home.  If you will be going home right after the procedure, plan to have someone with you for 24 hours. What happens during the procedure?  To lower your risk of infection: ? Your health care team will wash or sanitize their hands. ? Your skin will be washed with soap. ? Hair may be removed from your neck or groin.  An IV tube will be put into one of your veins.  You will be given a medicine to help you relax (sedative).  Skin on your neck or groin will be  numbed.  A cut (incision) will be made in your neck or groin.  A needle will be put through your cut and into a vein in your neck or groin.  A tube (catheter) will be put into the needle. The tube will be moved to your heart. X-rays (fluoroscopy) will be used to help guide the tube.  Small devices (electrodes) on the tip of the tube will send out electrical currents.  Dye may be put through the tube. This helps your surgeon see your heart.  Electrical energy will be used to scar (ablate) some heart tissue. Your surgeon may use: ? Heat (radiofrequency energy). ? Laser energy. ? Extreme cold (cryoablation).  The tube will be taken out.  Pressure will be held on your cut. This helps stop bleeding.  A bandage (dressing) will be put on your cut. The procedure may vary. What happens after the procedure?  You will be monitored until your medicines have worn off.  Your cut will be watched for bleeding. You will need to lie still for a few hours.  Do not drive for 24 hours or as long as your doctor tells you. Summary  Cardiac ablation is a procedure to stop some heart tissue from causing problems.  Electrical energy will be used to scar (ablate) some heart tissue. This information is not intended to replace advice given to you by your health care provider. Make sure you discuss any questions you have with your health care provider. Document Revised: 05/18/2017 Document Reviewed:  04/24/2016 Elsevier Patient Education  2020 [place] phone number to the [facility] is [phone]. The clinic is staffed Monday through Friday from 8:30am to 5pm.  Parking Directions: The clinic is located in the Heart and Vascular Building connected to [facility]. 1)From [address] turn on to [address] and go to the 3rd entrance  (Heart and Vascular entrance) on the right. 2)Look to the right for Heart &Vascular Parking Garage. 3)The parking code for May is  5008.   4)Take the elevators to the 1st floor. Registration is in the room with the glass walls at the end of the hallway.  If you have any trouble parking or locating the clinic, please dont hesitate to call [phone].

## 2019-10-03 NOTE — Progress Notes (Signed)
Patient and wife was given discharge instructions. Both verbalized understanding. 

## 2019-10-03 NOTE — Progress Notes (Signed)
  Echocardiogram Echocardiogram Transesophageal has been performed.  Travis Conrad 10/03/2019, 12:46 PM

## 2019-10-03 NOTE — H&P (Signed)
Electrophysiology Office Note   Date:  10/03/2019   ID:  Travis Conrad, DOB [birth year], MRN [id number]  PCP:  [name], MD  Cardiologist:  [name] Primary Electrophysiologist:  Stephanne Greeley [name], MD    Chief Complaint: AF   History of Present Illness: Travis Conrad is a 69 y.o. male who is being seen today for the evaluation of AF at the request of Travis Conrad. Presenting today for electrophysiology evaluation.  He has a history significant for atrial fibrillation and hypertension.  When he is in atrial fibrillation, he gets jittery and palpitations.  He wore a cardiac monitor that showed both atrial fibrillation and atrial flutter associated with his symptoms.  Today, denies symptoms of palpitations, chest pain, shortness of breath, orthopnea, PND, lower extremity edema, claudication, dizziness, presyncope, syncope, bleeding, or neurologic sequela. The patient is tolerating medications without difficulties. Plan for AF ablation today.    Past Medical History:  Diagnosis Date  . Atrial fibrillation ([place])   . High cholesterol    "RX made groin hurt" (08/14/2016)  . Hypertension    Past Surgical History:  Procedure Laterality Date  . ANKLE FRACTURE SURGERY Left ~ 2012   "put rod and pins in"  . COLONOSCOPY W/ BIOPSIES AND POLYPECTOMY  ~ 2000  . FRACTURE SURGERY    . ORIF FIBULA FRACTURE  06/24/2012   Procedure: OPEN REDUCTION INTERNAL FIXATION (ORIF) FIBULA FRACTURE;  Surgeon: [name], MD;  Location: [facility];  Service: Orthopedics;  Laterality: Left;  ORIF LEFT FIBULA,  POSSIBLE REPAIR OF SYNDESMOSIS      Current Facility-Administered Medications  Medication Dose Route Frequency Provider Last Rate Last Admin  . 0.9 %  sodium chloride infusion   Intravenous Continuous Constance Haw, MD 50 mL/hr at 10/03/19 0927 New Bag at 10/03/19 0927    Allergies:   Atorvastatin   Social History:  The patient  reports that he has been smoking cigarettes. He has a 50.00  pack-year smoking history. He has never used smokeless tobacco. He reports current alcohol use of about 75.0 standard drinks of alcohol per week. He reports current drug use. Drugs: Cocaine and Marijuana.   Family History:  The patient's family history includes Other in his brother.    ROS:  Please see the history of present illness.   Otherwise, review of systems is positive for none.   All other systems are reviewed and negative.    PHYSICAL EXAM: VS:  BP 119/64   Pulse (!) 58   Temp 97.7 F (36.5 C) (Skin)   Resp 18   Ht 6' (1.829 m)   Wt 79.4 kg   SpO2 98%   BMI 23.73 kg/m  , BMI Body mass index is 23.73 kg/m. GEN: Well nourished, well developed, in no acute distress  HEENT: normal  Neck: no JVD, carotid bruits, or masses Cardiac: RRR; no murmurs, rubs, or gallops,no edema  Respiratory:  clear to auscultation bilaterally, normal work of breathing GI: soft, nontender, nondistended, + BS MS: no deformity or atrophy  Skin: warm and dry Neuro:  Strength and sensation are intact Psych: euthymic mood, full affect    Recent Labs: 05/02/2019: TSH 2.370 07/22/2019: Magnesium 2.0 09/30/2019: Hemoglobin 16.4; Platelets 253 10/03/2019: BUN 9; Creatinine, Ser 1.31; Potassium 3.3; Sodium 137    Lipid Panel     Component Value Date/Time   CHOL 102 09/30/2019 0959   TRIG 225 (H) 09/30/2019 0959   HDL 35 (L) 09/30/2019 0959   CHOLHDL  2.9 09/30/2019 0959   CHOLHDL 4.9 06/04/2015 1022   VLDL 33 (H) 06/04/2015 1022   LDLCALC 32 09/30/2019 0959     Wt Readings from Last 3 Encounters:  10/03/19 79.4 kg  08/20/19 78.9 kg  07/22/19 79.1 kg      Other studies Reviewed: Additional studies/ records that were reviewed today include: TTE 08/15/16  Review of the above records today demonstrates:  - Left ventricle: The cavity size was normal. Wall thickness was  normal. Systolic function was normal. The estimated ejection  fraction was in the range of 60% to 65%. Left ventricular   diastolic function parameters were normal.  - Aortic valve: Trileaflet. Sclerosis without stenosis. There was  mild regurgitation.  - Mitral valve: Mildly thickened leaflets . There was trivial  regurgitation.  - Left atrium: Severely dilated.  - Right ventricle: The cavity size was normal. Systolic function is  mildly reduced.  - Inferior vena cava: The vessel was normal in size. The  respirophasic diameter changes were in the normal range (= 50%),  consistent with normal central venous pressure.  Cardiac Monitor 06/25/19 personally reviewed Sinus rhythm, atrial fibrllation, atrial flutter.   Symptoms of palpitations not associated with arrhythmias  ASSESSMENT AND PLAN:  1.  Paroxysmal atrial fibrillation/flutter: plan for ablaiton today.  Travis Conrad has presented today for surgery, with the diagnosis of atrial fibrillation.  The various methods of treatment have been discussed with the patient and family. After consideration of risks, benefits and other options for treatment, the patient has consented to  Procedure(s): Catheter ablation as a surgical intervention .  Risks include but not limited to bleeding, tamponade, heart block, stroke, damage to surrounding organs, among others. The patient's history has been reviewed, patient examined, no change in status, stable for surgery.  I have reviewed the patient's chart and labs.  Questions were answered to the patient's satisfaction.    Travis Febo [name], MD 10/03/2019 11:12 AM

## 2019-10-03 NOTE — Transfer of Care (Signed)
Immediate Anesthesia Transfer of Care Note  Patient: Travis Conrad  Procedure(s) Performed: ATRIAL FIBRILLATION ABLATION (N/A ) TRANSESOPHAGEAL ECHOCARDIOGRAM (TEE) (N/A )  Patient Location: PACU  Anesthesia Type:General  Level of Consciousness: awake, alert , oriented and patient cooperative  Airway & Oxygen Therapy: Patient Spontanous Breathing and Patient connected to nasal cannula oxygen  Post-op Assessment: Report given to RN and Post -op Vital signs reviewed and stable  Post vital signs: Reviewed and stable  Last Vitals:  Vitals Value Taken Time  BP 104/52 10/03/19 1512  Temp    Pulse 65 10/03/19 1516  Resp 17 10/03/19 1516  SpO2 97 % 10/03/19 1516  Vitals shown include unvalidated device data.  Last Pain:  Vitals:   10/03/19 1510  TempSrc:   PainSc: 0-No pain         Complications: No apparent anesthesia complications

## 2019-10-06 NOTE — Anesthesia Postprocedure Evaluation (Signed)
Anesthesia Post Note  Patient: Travis Conrad  Procedure(s) Performed: ATRIAL FIBRILLATION ABLATION (N/A ) TRANSESOPHAGEAL ECHOCARDIOGRAM (TEE) (N/A )     Patient location during evaluation: PACU Anesthesia Type: General Level of consciousness: awake and alert Pain management: pain level controlled Vital Signs Assessment: post-procedure vital signs reviewed and stable Respiratory status: spontaneous breathing, nonlabored ventilation, respiratory function stable and patient connected to nasal cannula oxygen Cardiovascular status: blood pressure returned to baseline and stable Postop Assessment: no apparent nausea or vomiting Anesthetic complications: no    Last Vitals:  Vitals:   10/03/19 1715 10/03/19 1800  BP: 118/67 111/69  Pulse: (!) 56 71  Resp: 19 19  Temp:    SpO2: 98% 99%    Last Pain:  Vitals:   10/03/19 1715  TempSrc:   PainSc: [place]

## 2019-10-08 ENCOUNTER — Ambulatory Visit: Payer: Medicare Other | Attending: Internal Medicine

## 2019-10-08 DIAGNOSIS — Z23 Encounter for immunization: Secondary | ICD-10-CM

## 2019-10-08 NOTE — Progress Notes (Signed)
   Covid-19 Vaccination Clinic  Name:  Travis Conrad    MRN: [id number] DOB: Apr 24, 1951  10/08/2019  Mr. Beeks was observed post Covid-19 immunization for 15 minutes without incident. He was provided with Vaccine Information Sheet and instruction to access the V-Safe system.   Mr. Glassburn was instructed to call 911 with any severe reactions post vaccine: [name] Difficulty breathing  . Swelling of face and throat  . A fast heartbeat  . A bad rash all over body  . Dizziness and weakness   Immunizations Administered    Name Date Dose VIS Date Route   Pfizer COVID-19 Vaccine 10/08/2019 10:35 AM 0.3 mL 08/13/2018 Intramuscular   Manufacturer: [place]   Lot: [id number]   [place]: [id number]

## 2019-11-02 NOTE — Progress Notes (Signed)
Primary Care Physician: [name], MD Primary Cardiologist: Dr [name] Primary Electrophysiologist: Dr Taylor/Dr [name] Referring Physician: Dr Trey Paula is a 69 y.o. male with a history of HTN, atrial flutter, and paroxysmal atrial fibrillation who presents for follow up in the [facility]. Patient has been maintained on sotalol but unfortunately continued to have symptomatic episodes of atrial fibrillation and atrial flutter. Patient is on Xarelto for a CHADS2VASC score of 2. He underwent afib ablation with Dr [name] on 10/03/19. He reports that he has done well since his procedure. He has had some episodes of palpitations but these have been shorter and less symptomatic than before the ablation. He denies CP, swallowing, or groin issues.  Today, he denies symptoms of chest pain, shortness of breath, orthopnea, PND, lower extremity edema, dizziness, presyncope, syncope, snoring, daytime somnolence, bleeding, or neurologic sequela. The patient is tolerating medications without difficulties and is otherwise without complaint today.    Atrial Fibrillation Risk Factors:  he does not have symptoms or diagnosis of sleep apnea. he does not have a history of rheumatic fever.   he has a BMI of Body mass index is 23.54 kg/m.[name] Filed Weights   11/03/19 1027  Weight: 78.7 kg    Family History  Problem Relation Age of Onset  . Other Brother      Atrial Fibrillation Management history:  Previous antiarrhythmic drugs: sotalol Previous cardioversions: none Previous ablations: 10/03/19 CHADS2VASC score: 2 Anticoagulation history: Xarelto   Past Medical History:  Diagnosis Date  . Atrial fibrillation ([place])   . High cholesterol    "RX made groin hurt" (08/14/2016)  . Hypertension    Past Surgical History:  Procedure Laterality Date  . ANKLE FRACTURE SURGERY Left ~ 2012   "put rod and pins in"  . ATRIAL FIBRILLATION ABLATION N/A 10/03/2019   Procedure: ATRIAL FIBRILLATION ABLATION;  Surgeon: Constance Haw, MD;  Location: [facility];  Service: Cardiovascular;  Laterality: N/A;  . COLONOSCOPY W/ BIOPSIES AND POLYPECTOMY  ~ 2000  . FRACTURE SURGERY    . ORIF FIBULA FRACTURE  06/24/2012   Procedure: OPEN REDUCTION INTERNAL FIXATION (ORIF) FIBULA FRACTURE;  Surgeon: [name], MD;  Location: [facility];  Service: Orthopedics;  Laterality: Left;  ORIF LEFT FIBULA,  POSSIBLE REPAIR OF SYNDESMOSIS   . TEE WITHOUT CARDIOVERSION N/A 10/03/2019   Procedure: TRANSESOPHAGEAL ECHOCARDIOGRAM (TEE);  Surgeon: Constance Haw, MD;  Location: [facility];  Service: Cardiovascular;  Laterality: N/A;    Current Outpatient Medications  Medication Sig Dispense Refill  . diltiazem (DILT-XR) 240 MG 24 hr capsule Take 1 capsule (240 mg total) by mouth daily with supper. TAKE 1 CAPSULE BY MOUTH DAILY WITH SUPPER (Patient taking differently: Take 240 mg by mouth daily with supper. ) 90 capsule 0  . losartan-hydrochlorothiazide (HYZAAR) 100-25 MG tablet TAKE 1/2 TABLET BY MOUTH DAILY (Patient taking differently: Take 0.5 tablets by mouth daily. ) 45 tablet 3  . rosuvastatin (CRESTOR) 20 MG tablet Take 1 tablet (20 mg total) by mouth daily. 90 tablet 3  . sotalol (BETAPACE) 120 MG tablet Take 1 tablet (120 mg total) by mouth every 12 (twelve) hours. 180 tablet 3  . XARELTO 20 MG TABS tablet TAKE 1 TABLET BY MOUTH EVERY DAY WITH DINNER (Patient taking differently: Take 20 mg by mouth daily with supper. ) 30 tablet 6   No current facility-administered medications for this encounter.    Allergies  Allergen  Reactions  . Atorvastatin Other (See Comments)    myalgias    Social History   Socioeconomic History  . Marital status: Married    Spouse name: Not on file  . Number of children: Not on file  . Years of education: Not on file  . Highest education level: Not on file  Occupational History  . Not on file  Tobacco Use  .  Smoking status: Current Every Day Smoker    Packs/day: 1.00    Years: 50.00    Pack years: 50.00    Types: Cigarettes  . Smokeless tobacco: Never Used  Substance and Sexual Activity  . Alcohol use: Yes    Alcohol/week: 75.0 standard drinks    Types: 75 Shots of liquor per week    Comment: 08/14/2016 "1 pint whiskey /day; last drink was yesterday"  . Drug use: Yes    Types: Cocaine, Marijuana    Comment: 08/14/2016 "last cocaine was in early 1990s; smoked pot yesterday"  . Sexual activity: Not Currently  Other Topics Concern  . Not on file  Social History Narrative  . Not on file   Social Determinants of Health   Financial Resource Strain:   . Difficulty of Paying Living Expenses:   Food Insecurity:   . Worried About Charity fundraiser in the Last Year:   . Arboriculturist in the Last Year:   Transportation Needs:   . Film/video editor (Medical):   [name] Lack of Transportation (Non-Medical):   Physical Activity:   . Days of Exercise per Week:   . Minutes of Exercise per Session:   Stress:   . Feeling of Stress :   Social Connections:   . Frequency of Communication with Friends and Family:   . Frequency of Social Gatherings with Friends and Family:   . Attends Religious Services:   . Active Member of Clubs or Organizations:   . Attends Archivist Meetings:   [name] Marital Status:   Intimate Partner Violence:   . Fear of Current or Ex-Partner:   . Emotionally Abused:   [name] Physically Abused:   . Sexually Abused:      ROS- All systems are reviewed and negative except as per the HPI above.  Physical Exam: Vitals:   11/03/19 1027  BP: 108/66  Pulse: (!) 54  Weight: 78.7 kg  Height: 6' (1.829 m)    GEN- The patient is well appearing, alert and oriented x 3 today.   Head- normocephalic, atraumatic Eyes-  Sclera clear, conjunctiva pink Ears- hearing intact Oropharynx- clear Neck- supple  Lungs- Clear to ausculation bilaterally, normal work of  breathing Heart- Regular rate and rhythm, no murmurs, rubs or gallops  GI- soft, NT, ND, + BS Extremities- no clubbing, cyanosis, or edema MS- no significant deformity or atrophy Skin- no rash or lesion Psych- euthymic mood, full affect Neuro- strength and sensation are intact  Wt Readings from Last 3 Encounters:  11/03/19 78.7 kg  10/03/19 79.4 kg  08/20/19 78.9 kg    EKG today demonstrates SB HR 54, NST, PR 184, QRS 84, QTc 485  Echo 08/15/16 demonstrated  - Left ventricle: The cavity size was normal. Wall thickness was  normal. Systolic function was normal. The estimated ejection  fraction was in the range of 60% to 65%. Left ventricular  diastolic function parameters were normal.  - Aortic valve: Trileaflet. Sclerosis without stenosis. There was  mild regurgitation.  - Mitral valve: Mildly thickened leaflets . There  was trivial  regurgitation.  - Left atrium: Severely dilated.  - Right ventricle: The cavity size was normal. Systolic function is  mildly reduced.  - Inferior vena cava: The vessel was normal in size. The  respirophasic diameter changes were in the normal range (= 50%),  consistent with normal central venous pressure.   Impressions:   - Compared to a prior echo in 2015, the LVEF is higher at 60-65%.  There is severe LAE.   Epic records are reviewed at length today  CHA2DS2-VASc Score = 2  The patient's score is based upon: CHF History: 0 HTN History: 1 Age : 1 Diabetes History: 0 Stroke History: 0 Vascular Disease History: 0 Gender: 0      ASSESSMENT AND PLAN: 1. Paroxysmal Atrial Fibrillation/atrial flutter The patient's CHA2DS2-VASc score is 2, indicating a 2.2% annual risk of stroke.   S/p afib ablation on 10/03/19 Reassured patient that paroxysms of afib are normal within the first 3 months post ablation.  Continue sotalol 120 mg BID. QT stable. Xarelto 20 mg daily with no missed doses for 3 months post ablation.   Continue diltiazem 240 mg daily  2. Secondary Hypercoagulable State (ICD10:  D68.69) The patient is at significant risk for stroke/thromboembolism based upon his CHA2DS2-VASc Score of 2.  Continue Rivaroxaban (Xarelto).   3. HTN Stable, no changes today.    Follow up with Dr [name] as scheduled.    [facility] [address] [place], [place] [address] [phone] 11/03/2019 11:19 AM

## 2019-11-03 ENCOUNTER — Other Ambulatory Visit: Payer: Self-pay

## 2019-11-03 ENCOUNTER — Ambulatory Visit (HOSPITAL_COMMUNITY)
Admit: 2019-11-03 | Discharge: 2019-11-03 | Disposition: A | Discharge status: Home / Self Care | Payer: Medicare Other | Attending: Physician Assistant | Admitting: Physician Assistant

## 2019-11-03 VITALS — BP 108/66 | HR 54 | Ht 72.0 in | Wt 173.6 lb

## 2019-11-03 DIAGNOSIS — I480 Paroxysmal atrial fibrillation: Secondary | ICD-10-CM | POA: Insufficient documentation

## 2019-11-03 DIAGNOSIS — F17210 Nicotine dependence, cigarettes, uncomplicated: Secondary | ICD-10-CM | POA: Insufficient documentation

## 2019-11-03 DIAGNOSIS — D6869 Other thrombophilia: Secondary | ICD-10-CM | POA: Diagnosis not present

## 2019-11-03 DIAGNOSIS — Z79899 Other long term (current) drug therapy: Secondary | ICD-10-CM | POA: Diagnosis not present

## 2019-11-03 DIAGNOSIS — E785 Hyperlipidemia, unspecified: Secondary | ICD-10-CM | POA: Diagnosis not present

## 2019-11-03 DIAGNOSIS — I4892 Unspecified atrial flutter: Secondary | ICD-10-CM | POA: Insufficient documentation

## 2019-11-03 DIAGNOSIS — Z7901 Long term (current) use of anticoagulants: Secondary | ICD-10-CM | POA: Diagnosis not present

## 2019-11-03 DIAGNOSIS — I10 Essential (primary) hypertension: Secondary | ICD-10-CM | POA: Insufficient documentation

## 2019-11-03 HISTORY — DX: Other thrombophilia: D68.69

## 2019-11-14 ENCOUNTER — Other Ambulatory Visit: Payer: Self-pay | Admitting: Internal Medicine

## 2019-11-25 ENCOUNTER — Ambulatory Visit: Payer: Medicare Other | Admitting: Cardiology

## 2019-12-18 ENCOUNTER — Telehealth: Payer: Self-pay | Admitting: Internal Medicine

## 2019-12-18 NOTE — Telephone Encounter (Signed)
Will forward to [name] and PharmD for recommendations.

## 2019-12-18 NOTE — Telephone Encounter (Signed)
   Pt c/o medication issue:  1. Name of Medication: Voltaren  2. How are you currently taking this medication (dosage and times per day)?   3. Are you having a reaction (difficulty breathing--STAT)?   4. What is your medication issue? Pt's wife calling, she said pt's pcp gave him a new prescription Voltaren and would like to make sure this will not going to effect pt' blood thinner and other heart meds.

## 2019-12-19 NOTE — Telephone Encounter (Signed)
Other than tylenol, a topical product like Voltaren is the safest option. There is very little systemic absorption, making bleeding risk very low.

## 2019-12-22 NOTE — Telephone Encounter (Signed)
Topical Voltaran for pains is OK   Also tylenol

## 2019-12-26 NOTE — Telephone Encounter (Signed)
Patients wife notified

## 2020-01-05 ENCOUNTER — Encounter: Payer: Self-pay | Admitting: Cardiology

## 2020-01-05 ENCOUNTER — Ambulatory Visit (INDEPENDENT_AMBULATORY_CARE_PROVIDER_SITE_OTHER): Payer: Medicare Other | Admitting: Cardiology

## 2020-01-05 ENCOUNTER — Other Ambulatory Visit: Payer: Self-pay

## 2020-01-05 VITALS — BP 100/60 | HR 57 | Ht 72.0 in | Wt 172.0 lb

## 2020-01-05 DIAGNOSIS — I4819 Other persistent atrial fibrillation: Secondary | ICD-10-CM

## 2020-01-05 DIAGNOSIS — Z79899 Other long term (current) drug therapy: Secondary | ICD-10-CM

## 2020-01-05 LAB — MAGNESIUM: Magnesium: 2.1 mg/dL (ref 1.6–2.3)

## 2020-01-05 NOTE — Patient Instructions (Signed)
Medication Instructions:  Your physician recommends that you continue on your current medications as directed. Please refer to the Current Medication list given to you today.  *If you need a refill on your cardiac medications before your next appointment, please call your pharmacy*   Lab Work: Today: Magnesium level If you have labs (blood work) drawn today and your tests are completely normal, you will receive your results only by: [name] MyChart Message (if you have MyChart) OR . A paper copy in the mail If you have any lab test that is abnormal or we need to change your treatment, we will call you to review the results.   Testing/Procedures: None ordered   Follow-Up: At [facility], you and your health needs are our priority.  As part of our continuing mission to provide you with exceptional heart care, we have created designated Provider Care Teams.  These Care Teams include your primary Cardiologist (physician) and Advanced Practice Providers (APPs -  Physician Assistants and Nurse Practitioners) who all work together to provide you with the care you need, when you need it.  We recommend signing up for the patient portal called "MyChart".  Sign up information is provided on this After Visit Summary.  MyChart is used to connect with patients for Virtual Visits (Telemedicine).  Patients are able to view lab/test results, encounter notes, upcoming appointments, etc.  Non-urgent messages can be sent to your provider as well.   To learn more about what you can do with MyChart, go to [email or web address].    Your next appointment:   3 month(s)  The format for your next appointment:   In Person  Provider:   Allegra Lai, MD   Thank you for choosing [facility]!!   Trinidad Curet, RN [phone]    Other Instructions

## 2020-01-05 NOTE — Progress Notes (Signed)
Electrophysiology Office Note   Date:  01/05/2020   ID:  Travis Conrad, DOB 09/11/1950, MRN [id number]  PCP:  [name], MD  Cardiologist:  [name] Primary Electrophysiologist:  Luvena Wentling [name], MD    Chief Complaint: AF   History of Present Illness: Travis Conrad is a 69 y.o. male who is being seen today for the evaluation of AF at the request of [name]. Presenting today for electrophysiology evaluation.  He has a history significant for atrial fibrillation and hypertension.  When he is in atrial fibrillation, he gets jittery and palpitations.  He wore a cardiac monitor that showed both atrial fibrillation and atrial flutter associated with his symptoms.  He is now status post ablation 10/03/2019.  Today, denies symptoms of palpitations, chest pain, shortness of breath, orthopnea, PND, lower extremity edema, claudication, dizziness, presyncope, syncope, bleeding, or neurologic sequela. The patient is tolerating medications without difficulties.  Overall he is doing well.  His atrial fibrillation burden has decreased since ablation.  He has had much fewer episodes, though he is continued to have episodes multiple times a month.  His episodes last for multiple hours during the day.  He has weakness and fatigue during his episodes.  In the month of July, he is felt much improved.   Past Medical History:  Diagnosis Date  . Atrial fibrillation ([place])   . High cholesterol    "RX made groin hurt" (08/14/2016)  . Hypertension    Past Surgical History:  Procedure Laterality Date  . ANKLE FRACTURE SURGERY Left ~ 2012   "put rod and pins in"  . ATRIAL FIBRILLATION ABLATION N/A 10/03/2019   Procedure: ATRIAL FIBRILLATION ABLATION;  Surgeon: Constance Haw, MD;  Location: [facility];  Service: Cardiovascular;  Laterality: N/A;  . COLONOSCOPY W/ BIOPSIES AND POLYPECTOMY  ~ 2000  . FRACTURE SURGERY    . ORIF FIBULA FRACTURE  06/24/2012   Procedure: OPEN REDUCTION INTERNAL  FIXATION (ORIF) FIBULA FRACTURE;  Surgeon: [name], MD;  Location: [facility];  Service: Orthopedics;  Laterality: Left;  ORIF LEFT FIBULA,  POSSIBLE REPAIR OF SYNDESMOSIS   . TEE WITHOUT CARDIOVERSION N/A 10/03/2019   Procedure: TRANSESOPHAGEAL ECHOCARDIOGRAM (TEE);  Surgeon: Constance Haw, MD;  Location: [facility];  Service: Cardiovascular;  Laterality: N/A;     Current Outpatient Medications  Medication Sig Dispense Refill  . DILT-XR 240 MG 24 hr capsule TAKE 1 CAPSULE(240 MG) BY MOUTH DAILY WITH SUPPER 90 capsule 0  . losartan-hydrochlorothiazide (HYZAAR) 100-25 MG tablet TAKE 1/2 TABLET BY MOUTH DAILY 45 tablet 3  . rosuvastatin (CRESTOR) 20 MG tablet Take 1 tablet (20 mg total) by mouth daily. 90 tablet 3  . sotalol (BETAPACE) 120 MG tablet Take 1 tablet (120 mg total) by mouth every 12 (twelve) hours. 180 tablet 3  . XARELTO 20 MG TABS tablet TAKE 1 TABLET BY MOUTH EVERY DAY WITH DINNER 30 tablet 6   No current facility-administered medications for this visit.    Allergies:   Atorvastatin   Social History:  The patient  reports that he has been smoking cigarettes. He has a 50.00 pack-year smoking history. He has never used smokeless tobacco. He reports current alcohol use of about 75.0 standard drinks of alcohol per week. He reports current drug use. Drugs: Cocaine and Marijuana.   Family History:  The patient's family history includes Other in his brother.   ROS:  Please see the history of present illness.  Otherwise, review of systems is positive for none.   All other systems are reviewed and negative.   PHYSICAL EXAM: VS:  BP 100/60   Pulse (!) 57   Ht 6' (1.829 m)   Wt 172 lb (78 kg)   SpO2 99%   BMI 23.33 kg/m  , BMI Body mass index is 23.33 kg/m. GEN: Well nourished, well developed, in no acute distress  HEENT: normal  Neck: no JVD, carotid bruits, or masses Cardiac: RRR; no murmurs, rubs, or gallops,no edema  Respiratory:  clear to auscultation  bilaterally, normal work of breathing GI: soft, nontender, nondistended, + BS MS: no deformity or atrophy  Skin: warm and dry Neuro:  Strength and sensation are intact Psych: euthymic mood, full affect  EKG:  EKG is ordered today. Personal review of the ekg ordered shows sinus rhythm, rate 57, QTc 471 ms  Recent Labs: 05/02/2019: TSH 2.370 07/22/2019: Magnesium 2.0 09/30/2019: Hemoglobin 16.4; Platelets 253 10/03/2019: BUN 9; Creatinine, Ser 1.31; Potassium 3.3; Sodium 137    Lipid Panel     Component Value Date/Time   CHOL 102 09/30/2019 0959   TRIG 225 (H) 09/30/2019 0959   HDL 35 (L) 09/30/2019 0959   CHOLHDL 2.9 09/30/2019 0959   CHOLHDL 4.9 06/04/2015 1022   VLDL 33 (H) 06/04/2015 1022   LDLCALC 32 09/30/2019 0959     Wt Readings from Last 3 Encounters:  01/05/20 172 lb (78 kg)  11/03/19 173 lb 9.6 oz (78.7 kg)  10/03/19 175 lb (79.4 kg)      Other studies Reviewed: Additional studies/ records that were reviewed today include: TTE 08/15/16  Review of the above records today demonstrates:  - Left ventricle: The cavity size was normal. Wall thickness was  normal. Systolic function was normal. The estimated ejection  fraction was in the range of 60% to 65%. Left ventricular  diastolic function parameters were normal.  - Aortic valve: Trileaflet. Sclerosis without stenosis. There was  mild regurgitation.  - Mitral valve: Mildly thickened leaflets . There was trivial  regurgitation.  - Left atrium: Severely dilated.  - Right ventricle: The cavity size was normal. Systolic function is  mildly reduced.  - Inferior vena cava: The vessel was normal in size. The  respirophasic diameter changes were in the normal range (= 50%),  consistent with normal central venous pressure.  Cardiac Monitor 06/25/19 personally reviewed Sinus rhythm, atrial fibrllation, atrial flutter.   Symptoms of palpitations not associated with arrhythmias  ASSESSMENT AND PLAN:  1.   Paroxysmal atrial fibrillation/flutter: Currently on diltiazem, sotalol, Xarelto.  CHA2DS2-VASc of 2.  Status post AF ablation 10/03/2019.  He is continued to have episodes of atrial fibrillation, though they have significantly decreased this month.  He would like to hold off on further therapy at this time.  I Payal Stanforth see him back in 3 months.  At that time, he Zakee Deerman may wish to have repeat ablation performed.  ECG monitoring and magnesium for sotalol, high risk medication.  2.  Hypertension: Currently well controlled  Current medicines are reviewed at length with the patient today.   The patient does not have concerns regarding his medicines.  The following changes were made today: None  Labs/ tests ordered today include:  Orders Placed This Encounter  Procedures  . Magnesium  . EKG 12-Lead     Disposition:   FU with Remijio Holleran 3 months  Signed, Lynette Topete [name], MD  01/05/2020 11:51 AM     [facility]  [address] [place] [address] [phone] (office) [phone] (fax)

## 2020-02-12 ENCOUNTER — Other Ambulatory Visit: Payer: Self-pay | Admitting: Internal Medicine

## 2020-02-12 ENCOUNTER — Other Ambulatory Visit: Payer: Self-pay | Admitting: Cardiology

## 2020-02-12 NOTE — Telephone Encounter (Signed)
Pt last saw Dr [name] 01/05/20, last labs 10/03/19 Creat 1.31, age 69, weight 78kg, CrCl 58.72, based on CrCl pt is on appropriate dosage of Xarelto 20mg  QD.  Will refill rx.

## 2020-04-05 ENCOUNTER — Other Ambulatory Visit (HOSPITAL_BASED_OUTPATIENT_CLINIC_OR_DEPARTMENT_OTHER): Payer: Self-pay | Admitting: Internal Medicine

## 2020-04-05 ENCOUNTER — Ambulatory Visit: Payer: Medicare Other | Attending: Internal Medicine

## 2020-04-05 DIAGNOSIS — Z23 Encounter for immunization: Secondary | ICD-10-CM

## 2020-04-05 NOTE — Progress Notes (Signed)
   Covid-19 Vaccination Clinic  Name:  MELVEN STOCKARD    MRN: [id number] DOB: 05/31/1951  04/05/2020  Mr. Mazor was observed post Covid-19 immunization for 15 minutes without incident. He was provided with Vaccine Information Sheet and instruction to access the V-Safe system.   Mr. Juncaj was instructed to call 911 with any severe reactions post vaccine: [name] Difficulty breathing  . Swelling of face and throat  . A fast heartbeat  . A bad rash all over body  . Dizziness and weakness

## 2020-04-06 ENCOUNTER — Ambulatory Visit (INDEPENDENT_AMBULATORY_CARE_PROVIDER_SITE_OTHER): Payer: Medicare Other | Admitting: Cardiology

## 2020-04-06 ENCOUNTER — Other Ambulatory Visit: Payer: Self-pay

## 2020-04-06 ENCOUNTER — Encounter: Payer: Self-pay | Admitting: Cardiology

## 2020-04-06 VITALS — BP 110/56 | HR 58 | Ht 72.0 in | Wt 173.6 lb

## 2020-04-06 DIAGNOSIS — I480 Paroxysmal atrial fibrillation: Secondary | ICD-10-CM

## 2020-04-06 NOTE — Patient Instructions (Signed)
Medication Instructions:  °Your physician recommends that you continue on your current medications as directed. Please refer to the Current Medication list given to you today. ° °*If you need a refill on your cardiac medications before your next appointment, please call your pharmacy* ° ° °Lab Work: °None ordered ° ° °Testing/Procedures: °None ordered ° ° °Follow-Up: °At [facility], you and your health needs are our priority.  As part of our continuing mission to provide you with exceptional heart care, we have created designated Provider Care Teams.  These Care Teams include your primary Cardiologist (physician) and Advanced Practice Providers (APPs -  Physician Assistants and Nurse Practitioners) who all work together to provide you with the care you need, when you need it. ° °Your next appointment:   °6 month(s) ° °The format for your next appointment:   °In Person ° °Provider:   °Will Camnitz, MD ° ° ° °Thank you for choosing [facility]!! ° ° °Laurel Smeltz, RN °[phone] °  °

## 2020-04-06 NOTE — Progress Notes (Signed)
Electrophysiology Office Note   Date:  04/06/2020   ID:  Travis Conrad, Travis Conrad 08-29-1950, MRN [id number]  PCP:  [name], MD  Cardiologist:  [name] Primary Electrophysiologist:  Reymond Maynez [name], MD    Chief Complaint: AF   History of Present Illness: Travis Conrad is a 69 y.o. male who is being seen today for the evaluation of AF at the request of [name]. Presenting today for electrophysiology evaluation.  He has a history of atrial fibrillation and hypertension.  When he is in atrial fibrillation he gets jittery with palpitations.  He wore a cardiac monitor that showed both atrial fibrillation and atrial flutter with associated symptoms.  He is now status post AF ablation 10/03/2019.  After his ablation, he continued to have short episodes of atrial fibrillation, though his control was much improved.  Today, denies symptoms of palpitations, chest pain, shortness of breath, orthopnea, PND, lower extremity edema, claudication, dizziness, presyncope, syncope, bleeding, or neurologic sequela. The patient is tolerating medications without difficulties.  He continues to have short episodes of atrial fibrillation.  His last one episode was 2 months ago.  He is comfortable with his overall control and does not wish any further therapy.  If his atrial fibrillation becomes more persistent, he would agree to a repeat ablation.   Past Medical History:  Diagnosis Date  . Atrial fibrillation (Travis Conrad)   . High cholesterol    "RX made groin hurt" (08/14/2016)  . Hypertension    Past Surgical History:  Procedure Laterality Date  . ANKLE FRACTURE SURGERY Left ~ 2012   "put rod and pins in"  . ATRIAL FIBRILLATION ABLATION N/A 10/03/2019   Procedure: ATRIAL FIBRILLATION ABLATION;  Surgeon: Constance Haw, MD;  Location: [facility];  Service: Cardiovascular;  Laterality: N/A;  . COLONOSCOPY W/ BIOPSIES AND POLYPECTOMY  ~ 2000  . FRACTURE SURGERY    . ORIF FIBULA FRACTURE   06/24/2012   Procedure: OPEN REDUCTION INTERNAL FIXATION (ORIF) FIBULA FRACTURE;  Surgeon: [name], MD;  Location: [facility];  Service: Orthopedics;  Laterality: Left;  ORIF LEFT FIBULA,  POSSIBLE REPAIR OF SYNDESMOSIS   . TEE WITHOUT CARDIOVERSION N/A 10/03/2019   Procedure: TRANSESOPHAGEAL ECHOCARDIOGRAM (TEE);  Surgeon: Constance Haw, MD;  Location: [facility];  Service: Cardiovascular;  Laterality: N/A;     Current Outpatient Medications  Medication Sig Dispense Refill  . DILT-XR 240 MG 24 hr capsule TAKE 1 CAPSULE(240 MG) BY MOUTH DAILY WITH SUPPER 90 capsule 3  . losartan-hydrochlorothiazide (HYZAAR) 100-25 MG tablet TAKE 1/2 TABLET BY MOUTH DAILY 45 tablet 3  . rosuvastatin (CRESTOR) 20 MG tablet Take 1 tablet (20 mg total) by mouth daily. 90 tablet 3  . sotalol (BETAPACE) 120 MG tablet Take 1 tablet (120 mg total) by mouth every 12 (twelve) hours. 180 tablet 3  . XARELTO 20 MG TABS tablet TAKE 1 TABLET BY MOUTH EVERY DAY WITH DINNER 30 tablet 6   No current facility-administered medications for this visit.    Allergies:   Atorvastatin   Social History:  The patient  reports that he has been smoking cigarettes. He has a 50.00 pack-year smoking history. He has never used smokeless tobacco. He reports current alcohol use of about 75.0 standard drinks of alcohol per week. He reports current drug use. Drugs: Cocaine and Marijuana.   Family History:  The patient's family history includes Other in his brother.   ROS:  Please see the history of  present illness.   Otherwise, review of systems is positive for none.   All other systems are reviewed and negative.   PHYSICAL EXAM: VS:  BP (!) 110/56   Pulse (!) 58   Ht 6' (1.829 m)   Wt 173 lb 9.6 oz (78.7 kg)   SpO2 98%   BMI 23.54 kg/m  , BMI Body mass index is 23.54 kg/m. GEN: Well nourished, well developed, in no acute distress  HEENT: normal  Neck: no JVD, carotid bruits, or masses Cardiac: RRR; no murmurs, rubs,  or gallops,no edema  Respiratory:  clear to auscultation bilaterally, normal work of breathing GI: soft, nontender, nondistended, + BS MS: no deformity or atrophy  Skin: warm and dry Neuro:  Strength and sensation are intact Psych: euthymic mood, full affect  EKG:  EKG is ordered today. Personal review of the ekg ordered shows sinus rhythm, rate 55, QTc 497 ms  Recent Labs: 05/02/2019: TSH 2.370 09/30/2019: Hemoglobin 16.4; Platelets 253 10/03/2019: BUN 9; Creatinine, Ser 1.31; Potassium 3.3; Sodium 137 01/05/2020: Magnesium 2.1    Lipid Panel     Component Value Date/Time   CHOL 102 09/30/2019 0959   TRIG 225 (H) 09/30/2019 0959   HDL 35 (L) 09/30/2019 0959   CHOLHDL 2.9 09/30/2019 0959   CHOLHDL 4.9 06/04/2015 1022   VLDL 33 (H) 06/04/2015 1022   LDLCALC 32 09/30/2019 0959     Wt Readings from Last 3 Encounters:  04/06/20 173 lb 9.6 oz (78.7 kg)  01/05/20 172 lb (78 kg)  11/03/19 173 lb 9.6 oz (78.7 kg)      Other studies Reviewed: Additional studies/ records that were reviewed today include: TTE 08/15/16  Review of the above records today demonstrates:  - Left ventricle: The cavity size was normal. Wall thickness was  normal. Systolic function was normal. The estimated ejection  fraction was in the range of 60% to 65%. Left ventricular  diastolic function parameters were normal.  - Aortic valve: Trileaflet. Sclerosis without stenosis. There was  mild regurgitation.  - Mitral valve: Mildly thickened leaflets . There was trivial  regurgitation.  - Left atrium: Severely dilated.  - Right ventricle: The cavity size was normal. Systolic function is  mildly reduced.  - Inferior vena cava: The vessel was normal in size. The  respirophasic diameter changes were in the normal range (= 50%),  consistent with normal central venous pressure.  Cardiac Monitor 06/25/19 personally reviewed Sinus rhythm, atrial fibrllation, atrial flutter.   Symptoms of  palpitations not associated with arrhythmias  ASSESSMENT AND PLAN:  1.  Paroxysmal atrial fibrillation: Currently on Xarelto, diltiazem, sotalol (ECG monitoring for high risk medication). CHA2DS2-VASc of 2.  Status post AF ablation 10/03/2018.  He continues to have short episodes of atrial fibrillation but he is comfortable with his overall control.  We Hurshell Dino continue with current management.  2.  Hypertension: Currently well controlled  Current medicines are reviewed at length with the patient today.   The patient does not have concerns regarding his medicines.  The following changes were made today: None  Labs/ tests ordered today include:  No orders of the defined types were placed in this encounter.    Disposition:   FU with Lateshia Schmoker 6 months  Signed, Kaytlynn Kochan [name], MD  04/06/2020 9:57 AM     [facility] [address] [place] [address] [phone] (office) [phone] (fax)

## 2020-04-07 NOTE — Addendum Note (Signed)
Addended by: Campbell Riches on: 04/07/2020 08:34 AM   Modules accepted: Orders

## 2020-04-13 MED FILL — PFIZER-BIONTECH COVID-19 VA: 30 | 1 days supply | Qty: 0 | Fill #0

## 2020-04-27 ENCOUNTER — Other Ambulatory Visit: Payer: Self-pay | Admitting: Internal Medicine

## 2020-05-12 ENCOUNTER — Other Ambulatory Visit: Payer: Self-pay | Admitting: Internal Medicine

## 2020-05-12 DIAGNOSIS — E785 Hyperlipidemia, unspecified: Secondary | ICD-10-CM

## 2020-05-12 DIAGNOSIS — I10 Essential (primary) hypertension: Secondary | ICD-10-CM

## 2020-06-11 ENCOUNTER — Other Ambulatory Visit: Payer: Self-pay | Admitting: Internal Medicine

## 2020-06-11 DIAGNOSIS — E785 Hyperlipidemia, unspecified: Secondary | ICD-10-CM

## 2020-06-11 DIAGNOSIS — I10 Essential (primary) hypertension: Secondary | ICD-10-CM

## 2020-06-28 ENCOUNTER — Emergency Department (HOSPITAL_BASED_OUTPATIENT_CLINIC_OR_DEPARTMENT_OTHER)
Admission: EM | Admit: 2020-06-28 | Discharge: 2020-06-28 | Disposition: A | Discharge status: Home / Self Care | Payer: Medicare Other | Attending: Emergency Medicine | Admitting: Emergency Medicine

## 2020-06-28 ENCOUNTER — Emergency Department (HOSPITAL_BASED_OUTPATIENT_CLINIC_OR_DEPARTMENT_OTHER): Payer: Medicare Other

## 2020-06-28 ENCOUNTER — Other Ambulatory Visit: Payer: Self-pay

## 2020-06-28 ENCOUNTER — Encounter (HOSPITAL_BASED_OUTPATIENT_CLINIC_OR_DEPARTMENT_OTHER): Payer: Self-pay | Admitting: *Deleted

## 2020-06-28 DIAGNOSIS — S0990XA Unspecified injury of head, initial encounter: Secondary | ICD-10-CM | POA: Diagnosis present

## 2020-06-28 DIAGNOSIS — W01198A Fall on same level from slipping, tripping and stumbling with subsequent striking against other object, initial encounter: Secondary | ICD-10-CM | POA: Insufficient documentation

## 2020-06-28 DIAGNOSIS — I10 Essential (primary) hypertension: Secondary | ICD-10-CM | POA: Insufficient documentation

## 2020-06-28 DIAGNOSIS — S2232XA Fracture of one rib, left side, initial encounter for closed fracture: Secondary | ICD-10-CM

## 2020-06-28 DIAGNOSIS — R059 Cough, unspecified: Secondary | ICD-10-CM | POA: Insufficient documentation

## 2020-06-28 DIAGNOSIS — F17210 Nicotine dependence, cigarettes, uncomplicated: Secondary | ICD-10-CM | POA: Diagnosis not present

## 2020-06-28 DIAGNOSIS — Z79899 Other long term (current) drug therapy: Secondary | ICD-10-CM | POA: Diagnosis not present

## 2020-06-28 DIAGNOSIS — Z7901 Long term (current) use of anticoagulants: Secondary | ICD-10-CM | POA: Insufficient documentation

## 2020-06-28 DIAGNOSIS — S299XXA Unspecified injury of thorax, initial encounter: Secondary | ICD-10-CM | POA: Diagnosis present

## 2020-06-28 MED ORDER — HYDROCODONE-ACETAMINOPHEN 5-325 MG PO TABS: 1.0000 | ORAL_TABLET | Freq: Four times a day (QID) | ORAL | 0 refills | Status: DC | PRN

## 2020-06-28 MED ORDER — LIDOCAINE 5 % EX PTCH
1.0000 | MEDICATED_PATCH | Freq: Once | CUTANEOUS | Status: DC
  Administered 2020-06-28: 1 via TRANSDERMAL
  Filled 2020-06-28: qty 1

## 2020-06-28 MED ORDER — HYDROCODONE-ACETAMINOPHEN 5-325 MG PO TABS
1.0000 | ORAL_TABLET | Freq: Once | ORAL | Status: AC
  Administered 2020-06-28: 1 via ORAL
  Filled 2020-06-28: qty 1

## 2020-06-28 NOTE — ED Notes (Signed)
Patient denies pain and is resting comfortably. Pain with movement

## 2020-06-28 NOTE — ED Provider Notes (Signed)
[facility] EMERGENCY DEPARTMENT Provider Note   CSN: [id number] Arrival date & time: 06/28/20  1830     History Chief Complaint  Patient presents with  . Fall    Travis Conrad is a 70 y.o. male.  Travis Conrad is a 70 y.o. male with a history of A. fib, hypertension, hyperlipidemia, on chronic anticoagulation, who presents to the ED for evaluation of rib pain after a mechanical fall.  Patient reports last night he got tripped up by his cat and fell striking his left side on an end table.  He denies hitting his head, denies any neck or back pain.  Reports initially he thought he was doing okay but today throughout the day he has had worsening pain on his left side in particular after he had a cough and felt something pop, since then he has had more severe pain that is now worse with any movement, coughing or deep breathing.  Denies any associated fevers.  No abdominal pain.  No nausea or vomiting.  Denies any focal pain in his extremities and has been able to walk. Received fentanyl with EMS and does report some improvement in his pain.        Past Medical History:  Diagnosis Date  . Atrial fibrillation ([place])   . High cholesterol    "RX made groin hurt" (08/14/2016)  . Hypertension     Patient Active Problem List   Diagnosis Date Noted  . Secondary hypercoagulable state ([place]) 11/03/2019  . Acute diverticulitis 11/19/2016  . Atrial fibrillation, chronic ([place]) 11/19/2016  . Elevated cholesterol 11/19/2016  . Essential hypertension 11/19/2016  . Paroxysmal atrial fibrillation ([place]) 08/14/2016    Past Surgical History:  Procedure Laterality Date  . ANKLE FRACTURE SURGERY Left ~ 2012   "put rod and pins in"  . ATRIAL FIBRILLATION ABLATION N/A 10/03/2019   Procedure: ATRIAL FIBRILLATION ABLATION;  Surgeon: Constance Haw, MD;  Location: [facility];  Service: Cardiovascular;  Laterality: N/A;  . COLONOSCOPY W/ BIOPSIES AND POLYPECTOMY  ~ 2000  .  FRACTURE SURGERY    . ORIF FIBULA FRACTURE  06/24/2012   Procedure: OPEN REDUCTION INTERNAL FIXATION (ORIF) FIBULA FRACTURE;  Surgeon: [name], MD;  Location: [facility];  Service: Orthopedics;  Laterality: Left;  ORIF LEFT FIBULA,  POSSIBLE REPAIR OF SYNDESMOSIS   . TEE WITHOUT CARDIOVERSION N/A 10/03/2019   Procedure: TRANSESOPHAGEAL ECHOCARDIOGRAM (TEE);  Surgeon: Constance Haw, MD;  Location: [name];  Service: Cardiovascular;  Laterality: N/A;       Family History  Problem Relation Age of Onset  . Other Brother     Social History   Tobacco Use  . Smoking status: Current Every Day Smoker    Packs/day: 1.00    Years: 50.00    Pack years: 50.00    Types: Cigarettes  . Smokeless tobacco: Never Used  Vaping Use  . Vaping Use: Never used  Substance Use Topics  . Alcohol use: Yes    Alcohol/week: 75.0 standard drinks    Types: 75 Shots of liquor per week    Comment: 08/14/2016 "1 pint whiskey /day; last drink was yesterday"  . Drug use: Yes    Types: Cocaine, Marijuana    Comment: 08/14/2016 "last cocaine was in early 1990s; smoked pot yesterday"    Home Medications Prior to Admission medications   Medication Sig Start Date End Date Taking? Authorizing Provider  HYDROcodone-acetaminophen (NORCO) 5-325 MG tablet Take 1-2 tablets by  mouth every 6 (six) hours as needed. 06/28/20  Yes Dartha Lodge, PA-C  DILT-XR 240 MG 24 hr capsule TAKE 1 CAPSULE(240 MG) BY MOUTH DAILY WITH SUPPER 02/12/20   Pricilla Riffle, MD  losartan-hydrochlorothiazide (HYZAAR) 100-25 MG tablet TAKE 1/2 TABLET BY MOUTH DAILY. PLEASE MAKE OVERDUE APPOINTMENT WITH DOCTOR ROSS BEFORE ANYMORE REFILLS. THANK YOU FIRST ATTEMPT 06/14/20   Pricilla Riffle, MD  rosuvastatin (CRESTOR) 20 MG tablet TAKE 1 TABLET(20 MG) BY MOUTH DAILY 04/28/20   Pricilla Riffle, MD  sotalol (BETAPACE) 120 MG tablet Take 1 tablet (120 mg total) by mouth every 12 (twelve) hours. 09/03/19   Pricilla Riffle, MD  XARELTO 20 MG TABS  tablet TAKE 1 TABLET BY MOUTH EVERY DAY WITH DINNER 02/12/20   Camnitz, Andree Coss, MD    Allergies    Atorvastatin  Review of Systems   Review of Systems  Constitutional: Negative for chills and fever.  Respiratory: Positive for cough. Negative for shortness of breath.   Cardiovascular: Positive for chest pain.  Gastrointestinal: Negative for abdominal pain, nausea and vomiting.  Musculoskeletal: Positive for myalgias. Negative for arthralgias and back pain.  Skin: Negative for color change and rash.  All other systems reviewed and are negative.   Physical Exam Updated Vital Signs BP 107/82 (BP Location: Right Arm)   Pulse 66   Temp 98 F (36.7 C) (Oral)   Resp 16   Ht 6' (1.829 m)   Wt 79.4 kg   SpO2 94%   BMI 23.73 kg/m   Physical Exam Vitals and nursing note reviewed.  Constitutional:      General: He is not in acute distress.    Appearance: Normal appearance. He is well-developed, normal weight and well-nourished. He is not ill-appearing or diaphoretic.     Comments: Elderly gentleman, appears uncomfortable but is in no acute distress  HENT:     Head: Normocephalic and atraumatic.     Comments: No evidence of head trauma, no hematoma, step-off or deformity, no ecchymosis    Mouth/Throat:     Mouth: Mucous membranes are moist.     Pharynx: Oropharynx is clear.  Eyes:     General:        Right eye: No discharge.        Left eye: No discharge.  Neck:     Comments: No midline C-spine tenderness, full range of motion Cardiovascular:     Rate and Rhythm: Normal rate and regular rhythm.     Pulses: Normal pulses.     Heart sounds: Normal heart sounds.  Pulmonary:     Effort: Pulmonary effort is normal. No respiratory distress.     Breath sounds: Normal breath sounds.     Comments: Respirations are equal and unlabored, patient does have pain with deep breathing, breath sounds present and equal bilaterally. Patient with tenderness to palpation over the lateral  lower ribs without palpable deformity, overlying skin changes or crepitus.  Chest wall is nontender elsewhere. Chest:     Chest wall: Tenderness present.  Abdominal:     General: Abdomen is flat. Bowel sounds are normal. There is no distension.     Palpations: Abdomen is soft. There is no mass.     Tenderness: There is no abdominal tenderness. There is no guarding.     Comments: Abdomen is soft and nondistended with no focal tenderness, in particular no left upper quadrant or left flank tenderness associated with left lower rib pain.  Musculoskeletal:  General: No deformity.     Cervical back: Neck supple.  Skin:    General: Skin is warm and dry.  Neurological:     Mental Status: He is alert and oriented to person, place, and time.     Coordination: Coordination normal.  Psychiatric:        Mood and Affect: Mood and affect and mood normal.        Behavior: Behavior normal.     ED Results / Procedures / Treatments   Labs (all labs ordered are listed, but only abnormal results are displayed) Labs Reviewed - No data to display  EKG EKG Interpretation  Date/Time:  Monday June 28 2020 20:01:24 EST Ventricular Rate:  65 PR Interval:    QRS Duration: 96 QT Interval:  427 QTC Calculation: 444 R Axis:   84 Text Interpretation: Sinus rhythm Short PR interval Borderline right axis deviation Borderline repolarization abnormality Confirmed by Lennice Sites ([id number]) on 06/29/2020 11:31:14 AM   Radiology DG Ribs Unilateral W/Chest Left  Result Date: 06/28/2020 CLINICAL DATA:  Fall now with left lower back/rib pain. EXAM: LEFT RIBS AND CHEST - 3+ VIEW COMPARISON:  None. FINDINGS: Subtle bowing and cortical irregularity of the lateral left eighth and ninth ribs. There is no evidence of pneumothorax or pleural effusion. Both lungs are clear. Heart size and mediastinal contours are within normal limits. IMPRESSION: Subtle bowing and cortical irregularity of the lateral left eighth and  ninth ribs which may represent nondisplaced fractures recommend correlation with direct palpation for point tenderness. Electronically Signed   By: Dahlia Bailiff MD   On: 06/28/2020 20:38   CT Head Wo Contrast  Result Date: 06/28/2020 CLINICAL DATA:  Head trauma.  Fall last night. EXAM: CT HEAD WITHOUT CONTRAST TECHNIQUE: Contiguous axial images were obtained from the base of the skull through the vertex without intravenous contrast. COMPARISON:  None. FINDINGS: Brain: There is no evidence of an acute infarct, intracranial hemorrhage, mass, midline shift, or extra-axial fluid collection. Hypodensities in the cerebral white matter nonspecific but compatible with minimal chronic small vessel ischemic disease. Mild cerebral atrophy is within normal limits for age. Vascular: Calcified atherosclerosis at the skull base. No hyperdense vessel. Skull: No fracture or suspicious osseous lesion. Sinuses/Orbits: Mild bilateral ethmoid air cell mucosal thickening. Clear mastoid air cells. Unremarkable orbits. Other: None. IMPRESSION: No evidence of acute intracranial abnormality. Electronically Signed   By: Logan Bores M.D.   On: 06/28/2020 21:29   CT Chest Wo Contrast  Result Date: 06/28/2020 CLINICAL DATA:  Fall, rib fracture suspected. EXAM: CT CHEST WITHOUT CONTRAST TECHNIQUE: Multidetector CT imaging of the chest was performed following the standard protocol without IV contrast. COMPARISON:  None. FINDINGS: Cardiovascular: Heart is normal size. Diffuse coronary artery and aortic calcifications. Aorta normal caliber. Mediastinum/Nodes: No mediastinal, hilar, or axillary adenopathy. Trachea and esophagus are unremarkable. Thyroid unremarkable. Lungs/Pleura: No confluent opacities or effusions.  No pneumothorax. Upper Abdomen: Imaging into the upper abdomen demonstrates no acute findings. Musculoskeletal: Fracture through the posterior left 11th rib, nondisplaced. Old healed right rib fractures. IMPRESSION:  Nondisplaced posterior left 11th rib fracture. No pneumothorax or effusion. Coronary artery disease. Aortic Atherosclerosis (ICD10-I70.0). Electronically Signed   By: Rolm Baptise M.D.   On: 06/28/2020 21:31    Procedures Procedures (including critical care time)  Medications Ordered in ED Medications  HYDROcodone-acetaminophen (NORCO/VICODIN) 5-325 MG per tablet 1 tablet (1 tablet Oral Given 06/28/20 1941)  HYDROcodone-acetaminophen (NORCO/VICODIN) 5-325 MG per tablet 1 tablet (1 tablet Oral Given 06/28/20  2235)    ED Course  I have reviewed the triage vital signs and the nursing notes.  Pertinent labs & imaging results that were available during my care of the patient were reviewed by me and considered in my medical decision making (see chart for details).   MDM Rules/Calculators/A&P                          70 year old male presents after mechanical fall yesterday, striking his left lateral chest on an end table.  Denies head injury, but patient is on anticoagulation.  Initially he was doing okay with pain but after cough he felt like he heard something pop and has had worsening pain over the left lateral chest.  Focally tender over the left lateral lower ribs without palpable deformity, breath sounds present and equal bilaterally.  No hypoxia.  Chest x-ray with questionable left eighth and ninth rib fractures.  Will get chest CT to better characterize injuries and assess for other intrathoracic injuries and will also get CT of the head given anticoagulation with significant fall.  Patient with no midline spinal tenderness.  No abdominal tenderness.  No injury to the extremities.  Pain treated here in the ED.  CT of the head is unremarkable. CT of the chest shows old healed eighth and ninth rib fractures but an acute fracture through the posterior left 11th rib which is nondisplaced.  This certainly explains patient's lower pain.  No other intrathoracic injuries, no pneumothorax  noted.  Given singular rib fracture and the focus will be on pain management and patient provided incentive spirometer to help encourage deep breathing and prevent pneumonia.  Discussed pain management at home and PCP follow-up.  Provided strict return precautions.  Patient expresses understanding and agreement with this plan.  Discharged home in good condition.  Patient discussed with Dr. Almyra Free, who saw patient as well and agrees with plan.   Final Clinical Impression(s) / ED Diagnoses Final diagnoses:  Closed fracture of one rib of left side, initial encounter    Rx / DC Orders ED Discharge Orders         Ordered    HYDROcodone-acetaminophen (NORCO) 5-325 MG tablet  Every 6 hours PRN        06/28/20 2225           Janet Berlin 07/03/20 [id number]    Luna Fuse, MD 07/05/20 1655

## 2020-06-28 NOTE — ED Triage Notes (Signed)
Fell last night around 8 pm and patient stated that when he coughed, he felt something popped on his left side.

## 2020-06-28 NOTE — Discharge Instructions (Addendum)
You have a fracture to your left 11th rib.  This will take time to heal, use incentive spirometer every 1-2 hours while awake to help promote deep breaths and prevent pneumonia.  To help control pain you can take 325 mg of Tylenol every 6 hours and 1 tablet of Norco as needed for breakthrough pain every 6 hours, if you are having severe pain you can take an additional tablet of Norco.  You can also use over-the-counter Salonpas lidocaine patches which can be applied for 12 hours and then remove for 12 hours to provide local relief.  You can also use ice and heat.  Use a pillow or blanket to brace over your left side with movement or cough.  Please follow-up with your primary care provider.  Return for worsening pain, shortness of breath or fever.

## 2020-08-11 ENCOUNTER — Other Ambulatory Visit: Payer: Self-pay | Admitting: *Deleted

## 2020-08-11 ENCOUNTER — Other Ambulatory Visit: Payer: Self-pay | Admitting: Internal Medicine

## 2020-08-11 MED ORDER — RIVAROXABAN 20 MG PO TABS: ORAL_TABLET | ORAL | 5 refills | Status: DC

## 2020-08-11 NOTE — Telephone Encounter (Addendum)
Prescription refill request for Xarelto received from [facility].   Indication:Afib Last office visit: Camnitz, 04/06/2020 Weight: 79.4 kg Age: 70 yo  Scr: 1.37, 09/30/2019 CrCl: 56 ml/min   Pt on the correct dose per dosing criteria. Prescription refill sent for Xarelto 20mg , once daily

## 2020-09-10 ENCOUNTER — Other Ambulatory Visit: Payer: Self-pay | Admitting: Internal Medicine

## 2020-09-10 ENCOUNTER — Other Ambulatory Visit: Payer: Self-pay | Admitting: Cardiology

## 2020-09-10 DIAGNOSIS — I10 Essential (primary) hypertension: Secondary | ICD-10-CM

## 2020-09-10 DIAGNOSIS — E785 Hyperlipidemia, unspecified: Secondary | ICD-10-CM

## 2020-09-10 NOTE — Telephone Encounter (Signed)
Pt's age 70, wt 79.4 kg, SCr 1.13, CrCl 68.31, last ov w/ WC 07/08/19.

## 2020-10-26 ENCOUNTER — Other Ambulatory Visit: Payer: Self-pay | Admitting: Internal Medicine

## 2020-10-26 DIAGNOSIS — I10 Essential (primary) hypertension: Secondary | ICD-10-CM

## 2020-10-26 DIAGNOSIS — E785 Hyperlipidemia, unspecified: Secondary | ICD-10-CM

## 2021-01-17 ENCOUNTER — Ambulatory Visit: Payer: Self-pay

## 2021-01-17 NOTE — Telephone Encounter (Signed)
Reason for Disposition  All other insect bites  Answer Assessment - Initial Assessment Questions 1. TYPE of INSECT: "What type of insect was it?"      Hornet 2. ONSET: "When did you get bitten?"      A few minutes ago 3. LOCATION: "Where is the insect bite located?"      Hands and arms 4. REDNESS: "Is the area red or pink?" If Yes, ask: "What size is area of redness?" (inches or cm). "When did the redness start?"     Justthe sting spots. 5. PAIN: "Is there any pain?" If Yes, ask: "How bad is it?"  (Scale 1-10; or mild, moderate, severe)     Mild/moderate 6. ITCHING: "Does it itch?" If Yes, ask: "How bad is the itch?"    - MILD: doesn't interfere with normal activities   - MODERATE-SEVERE: interferes with work, school, sleep, or other activities      no 7. SWELLING: "How big is the swelling?" (inches, cm, or compare to coins)    Some 8. OTHER SYMPTOMS: "Do you have any other symptoms?"  (e.g., difficulty breathing, hives)     no 9. PREGNANCY: "Is there any chance you are pregnant?" "When was your last menstrual period?"     no  Protocols used: Insect Bite-A-AH

## 2021-01-17 NOTE — Telephone Encounter (Signed)
Pt called, verified that I may speak to wife regarding medical care.   Pt was bitten several times by hornets while mowing the grass.   Pt has red sting  marks on his arms and hands. Hands are slightly swollen.   Pt will use hydrocortisone cream on stings. Pt and wife are hesitant to use benadryl.   Wife will monitor pt for facial swelling/ difficulty breathing and take him to UC if needed.

## 2021-02-07 ENCOUNTER — Other Ambulatory Visit: Payer: Self-pay | Admitting: Internal Medicine

## 2021-02-19 ENCOUNTER — Encounter (HOSPITAL_BASED_OUTPATIENT_CLINIC_OR_DEPARTMENT_OTHER): Payer: Self-pay | Admitting: Emergency Medicine

## 2021-02-19 ENCOUNTER — Emergency Department (HOSPITAL_BASED_OUTPATIENT_CLINIC_OR_DEPARTMENT_OTHER): Payer: Medicare Other

## 2021-02-19 ENCOUNTER — Other Ambulatory Visit: Payer: Self-pay

## 2021-02-19 ENCOUNTER — Inpatient Hospital Stay (HOSPITAL_BASED_OUTPATIENT_CLINIC_OR_DEPARTMENT_OTHER)
Admission: EM | Admit: 2021-02-19 | Discharge: 2021-04-06 | DRG: 853 | Disposition: A | Discharge status: Rehabilitation Facility | Payer: Medicare Other | Attending: Internal Medicine | Admitting: Internal Medicine

## 2021-02-19 DIAGNOSIS — F10239 Alcohol dependence with withdrawal, unspecified: Secondary | ICD-10-CM | POA: Diagnosis not present

## 2021-02-19 DIAGNOSIS — K56600 Partial intestinal obstruction, unspecified as to cause: Secondary | ICD-10-CM | POA: Diagnosis present

## 2021-02-19 DIAGNOSIS — R109 Unspecified abdominal pain: Secondary | ICD-10-CM | POA: Diagnosis not present

## 2021-02-19 DIAGNOSIS — D539 Nutritional anemia, unspecified: Secondary | ICD-10-CM | POA: Diagnosis not present

## 2021-02-19 DIAGNOSIS — I482 Chronic atrial fibrillation, unspecified: Secondary | ICD-10-CM | POA: Diagnosis not present

## 2021-02-19 DIAGNOSIS — J9601 Acute respiratory failure with hypoxia: Secondary | ICD-10-CM | POA: Diagnosis not present

## 2021-02-19 DIAGNOSIS — N179 Acute kidney failure, unspecified: Secondary | ICD-10-CM | POA: Diagnosis not present

## 2021-02-19 DIAGNOSIS — K5780 Diverticulitis of intestine, part unspecified, with perforation and abscess without bleeding: Secondary | ICD-10-CM

## 2021-02-19 DIAGNOSIS — R6520 Severe sepsis without septic shock: Secondary | ICD-10-CM | POA: Diagnosis present

## 2021-02-19 DIAGNOSIS — K6812 Psoas muscle abscess: Secondary | ICD-10-CM | POA: Diagnosis not present

## 2021-02-19 DIAGNOSIS — Z781 Physical restraint status: Secondary | ICD-10-CM

## 2021-02-19 DIAGNOSIS — R12 Heartburn: Secondary | ICD-10-CM | POA: Diagnosis present

## 2021-02-19 DIAGNOSIS — A419 Sepsis, unspecified organism: Principal | ICD-10-CM | POA: Diagnosis present

## 2021-02-19 DIAGNOSIS — D62 Acute posthemorrhagic anemia: Secondary | ICD-10-CM | POA: Diagnosis not present

## 2021-02-19 DIAGNOSIS — R443 Hallucinations, unspecified: Secondary | ICD-10-CM | POA: Diagnosis not present

## 2021-02-19 DIAGNOSIS — E871 Hypo-osmolality and hyponatremia: Secondary | ICD-10-CM | POA: Diagnosis not present

## 2021-02-19 DIAGNOSIS — Z20822 Contact with and (suspected) exposure to covid-19: Secondary | ICD-10-CM | POA: Diagnosis present

## 2021-02-19 DIAGNOSIS — I959 Hypotension, unspecified: Secondary | ICD-10-CM | POA: Diagnosis present

## 2021-02-19 DIAGNOSIS — R630 Anorexia: Secondary | ICD-10-CM | POA: Diagnosis not present

## 2021-02-19 DIAGNOSIS — R059 Cough, unspecified: Secondary | ICD-10-CM

## 2021-02-19 DIAGNOSIS — R0682 Tachypnea, not elsewhere classified: Secondary | ICD-10-CM

## 2021-02-19 DIAGNOSIS — D638 Anemia in other chronic diseases classified elsewhere: Secondary | ICD-10-CM | POA: Diagnosis present

## 2021-02-19 DIAGNOSIS — Z7901 Long term (current) use of anticoagulants: Secondary | ICD-10-CM

## 2021-02-19 DIAGNOSIS — Z79899 Other long term (current) drug therapy: Secondary | ICD-10-CM

## 2021-02-19 DIAGNOSIS — E876 Hypokalemia: Secondary | ICD-10-CM | POA: Diagnosis present

## 2021-02-19 DIAGNOSIS — E46 Unspecified protein-calorie malnutrition: Secondary | ICD-10-CM | POA: Diagnosis present

## 2021-02-19 DIAGNOSIS — M1712 Unilateral primary osteoarthritis, left knee: Secondary | ICD-10-CM | POA: Diagnosis present

## 2021-02-19 DIAGNOSIS — E875 Hyperkalemia: Secondary | ICD-10-CM | POA: Diagnosis not present

## 2021-02-19 DIAGNOSIS — N432 Other hydrocele: Secondary | ICD-10-CM | POA: Diagnosis present

## 2021-02-19 DIAGNOSIS — B952 Enterococcus as the cause of diseases classified elsewhere: Secondary | ICD-10-CM | POA: Diagnosis not present

## 2021-02-19 DIAGNOSIS — F17210 Nicotine dependence, cigarettes, uncomplicated: Secondary | ICD-10-CM | POA: Diagnosis present

## 2021-02-19 DIAGNOSIS — I110 Hypertensive heart disease with heart failure: Secondary | ICD-10-CM | POA: Diagnosis not present

## 2021-02-19 DIAGNOSIS — K56609 Unspecified intestinal obstruction, unspecified as to partial versus complete obstruction: Secondary | ICD-10-CM

## 2021-02-19 DIAGNOSIS — E7800 Pure hypercholesterolemia, unspecified: Secondary | ICD-10-CM | POA: Diagnosis not present

## 2021-02-19 DIAGNOSIS — R0600 Dyspnea, unspecified: Secondary | ICD-10-CM

## 2021-02-19 DIAGNOSIS — N451 Epididymitis: Secondary | ICD-10-CM

## 2021-02-19 DIAGNOSIS — K651 Peritoneal abscess: Secondary | ICD-10-CM

## 2021-02-19 DIAGNOSIS — Z1621 Resistance to vancomycin: Secondary | ICD-10-CM | POA: Diagnosis not present

## 2021-02-19 DIAGNOSIS — R0902 Hypoxemia: Secondary | ICD-10-CM

## 2021-02-19 DIAGNOSIS — Z765 Malingerer [conscious simulation]: Secondary | ICD-10-CM

## 2021-02-19 DIAGNOSIS — K9189 Other postprocedural complications and disorders of digestive system: Secondary | ICD-10-CM

## 2021-02-19 DIAGNOSIS — K5792 Diverticulitis of intestine, part unspecified, without perforation or abscess without bleeding: Secondary | ICD-10-CM | POA: Diagnosis present

## 2021-02-19 DIAGNOSIS — D1779 Benign lipomatous neoplasm of other sites: Secondary | ICD-10-CM | POA: Diagnosis not present

## 2021-02-19 DIAGNOSIS — N5082 Scrotal pain: Secondary | ICD-10-CM

## 2021-02-19 DIAGNOSIS — R52 Pain, unspecified: Secondary | ICD-10-CM

## 2021-02-19 DIAGNOSIS — Z0189 Encounter for other specified special examinations: Secondary | ICD-10-CM

## 2021-02-19 DIAGNOSIS — K567 Ileus, unspecified: Secondary | ICD-10-CM | POA: Diagnosis not present

## 2021-02-19 DIAGNOSIS — I5031 Acute diastolic (congestive) heart failure: Secondary | ICD-10-CM | POA: Diagnosis not present

## 2021-02-19 DIAGNOSIS — Z4659 Encounter for fitting and adjustment of other gastrointestinal appliance and device: Secondary | ICD-10-CM

## 2021-02-19 DIAGNOSIS — I10 Essential (primary) hypertension: Secondary | ICD-10-CM | POA: Diagnosis present

## 2021-02-19 DIAGNOSIS — R682 Dry mouth, unspecified: Secondary | ICD-10-CM | POA: Diagnosis not present

## 2021-02-19 DIAGNOSIS — K5720 Diverticulitis of large intestine with perforation and abscess without bleeding: Secondary | ICD-10-CM

## 2021-02-19 DIAGNOSIS — I4892 Unspecified atrial flutter: Secondary | ICD-10-CM | POA: Diagnosis present

## 2021-02-19 DIAGNOSIS — F419 Anxiety disorder, unspecified: Secondary | ICD-10-CM | POA: Diagnosis not present

## 2021-02-19 DIAGNOSIS — M25562 Pain in left knee: Secondary | ICD-10-CM

## 2021-02-19 DIAGNOSIS — R079 Chest pain, unspecified: Secondary | ICD-10-CM

## 2021-02-19 DIAGNOSIS — N453 Epididymo-orchitis: Secondary | ICD-10-CM | POA: Diagnosis not present

## 2021-02-19 DIAGNOSIS — I480 Paroxysmal atrial fibrillation: Secondary | ICD-10-CM | POA: Diagnosis present

## 2021-02-19 DIAGNOSIS — K5790 Diverticulosis of intestine, part unspecified, without perforation or abscess without bleeding: Secondary | ICD-10-CM | POA: Diagnosis present

## 2021-02-19 DIAGNOSIS — R0602 Shortness of breath: Secondary | ICD-10-CM

## 2021-02-19 HISTORY — DX: Cardiac arrhythmia, unspecified: I49.9

## 2021-02-19 LAB — DIFFERENTIAL
Abs Immature Granulocytes: 0.2 10*3/uL — ABNORMAL HIGH (ref 0.00–0.07)
Basophils Absolute: 0.1 10*3/uL (ref 0.0–0.1)
Basophils Relative: 0 %
Eosinophils Absolute: 0 10*3/uL (ref 0.0–0.5)
Eosinophils Relative: 0 %
Immature Granulocytes: 1 %
Lymphocytes Relative: 8 %
Lymphs Abs: 1.7 10*3/uL (ref 0.7–4.0)
Monocytes Absolute: 1.6 10*3/uL — ABNORMAL HIGH (ref 0.1–1.0)
Monocytes Relative: 7 %
Neutro Abs: 18.3 10*3/uL — ABNORMAL HIGH (ref 1.7–7.7)
Neutrophils Relative %: 84 %

## 2021-02-19 LAB — COMPREHENSIVE METABOLIC PANEL
ALT: 11 U/L (ref 0–44)
AST: 18 U/L (ref 15–41)
Albumin: 3.9 g/dL (ref 3.5–5.0)
Alkaline Phosphatase: 85 U/L (ref 38–126)
Anion gap: 18 — ABNORMAL HIGH (ref 5–15)
BUN: 38 mg/dL — ABNORMAL HIGH (ref 8–23)
CO2: 25 mmol/L (ref 22–32)
Calcium: 11 mg/dL — ABNORMAL HIGH (ref 8.9–10.3)
Chloride: 88 mmol/L — ABNORMAL LOW (ref 98–111)
Creatinine, Ser: 3.23 mg/dL — ABNORMAL HIGH (ref 0.61–1.24)
GFR, Estimated: 20 mL/min — ABNORMAL LOW (ref >60)
Glucose, Bld: 137 mg/dL — ABNORMAL HIGH (ref 70–99)
Potassium: 3.2 mmol/L — ABNORMAL LOW (ref 3.5–5.1)
Sodium: 131 mmol/L — ABNORMAL LOW (ref 135–145)
Total Bilirubin: 1.1 mg/dL (ref 0.3–1.2)
Total Protein: 7.6 g/dL (ref 6.5–8.1)

## 2021-02-19 LAB — CBC
HCT: 44.8 % (ref 39.0–52.0)
Hemoglobin: 16 g/dL (ref 13.0–17.0)
MCH: 34.3 pg — ABNORMAL HIGH (ref 26.0–34.0)
MCHC: 35.7 g/dL (ref 30.0–36.0)
MCV: 96.1 fL (ref 80.0–100.0)
Platelets: 254 10*3/uL (ref 150–400)
RBC: 4.66 MIL/uL (ref 4.22–5.81)
RDW: 12.6 % (ref 11.5–15.5)
WBC: 21.8 10*3/uL — ABNORMAL HIGH (ref 4.0–10.5)
nRBC: 0 % (ref 0.0–0.2)

## 2021-02-19 LAB — PROTIME-INR
INR: 1.1 (ref 0.8–1.2)
Prothrombin Time: 14.6 seconds (ref 11.4–15.2)

## 2021-02-19 LAB — URINALYSIS, ROUTINE W REFLEX MICROSCOPIC
Glucose, UA: NEGATIVE mg/dL
Ketones, ur: NEGATIVE mg/dL
Leukocytes,Ua: NEGATIVE
Nitrite: NEGATIVE
Protein, ur: >300 mg/dL — AB
RBC / HPF: >50 RBC/hpf — ABNORMAL HIGH (ref 0–5)
Specific Gravity, Urine: 1.026 (ref 1.005–1.030)
pH: 5.5 (ref 5.0–8.0)

## 2021-02-19 LAB — PHOSPHORUS: Phosphorus: 4.8 mg/dL — ABNORMAL HIGH (ref 2.5–4.6)

## 2021-02-19 LAB — RESP PANEL BY RT-PCR (FLU A&B, COVID) ARPGX2
Influenza A by PCR: NEGATIVE
Influenza B by PCR: NEGATIVE
SARS Coronavirus 2 by RT PCR: NEGATIVE

## 2021-02-19 LAB — LIPASE, BLOOD: Lipase: 22 U/L (ref 11–51)

## 2021-02-19 LAB — LACTIC ACID, PLASMA: Lactic Acid, Venous: 1.3 mmol/L (ref 0.5–1.9)

## 2021-02-19 LAB — MAGNESIUM: Magnesium: 2 mg/dL (ref 1.7–2.4)

## 2021-02-19 MED ORDER — SODIUM CHLORIDE 0.9 % IV SOLN: 2.0000 g | Freq: Once | INTRAVENOUS | Status: DC

## 2021-02-19 MED ORDER — METRONIDAZOLE 500 MG/100ML IV SOLN: 500.0000 mg | Freq: Once | INTRAVENOUS | Status: DC

## 2021-02-19 MED ORDER — LACTATED RINGERS IV BOLUS
1000.0000 mL | Freq: Once | INTRAVENOUS | Status: AC
  Administered 2021-02-19: 1000 mL via INTRAVENOUS

## 2021-02-19 MED ORDER — LACTATED RINGERS IV SOLN: INTRAVENOUS | Status: AC

## 2021-02-19 MED ORDER — HYDROMORPHONE HCL 1 MG/ML IJ SOLN
1.0000 mg | Freq: Once | INTRAMUSCULAR | Status: AC
  Administered 2021-02-19: 1 mg via INTRAVENOUS
  Filled 2021-02-19: qty 1

## 2021-02-19 MED ORDER — ONDANSETRON HCL 4 MG/2ML IJ SOLN
4.0000 mg | Freq: Once | INTRAMUSCULAR | Status: AC
  Administered 2021-02-19: 4 mg via INTRAVENOUS
  Filled 2021-02-19: qty 2

## 2021-02-19 MED ORDER — LACTATED RINGERS IV BOLUS
500.0000 mL | Freq: Once | INTRAVENOUS | Status: AC
  Administered 2021-02-19: 500 mL via INTRAVENOUS

## 2021-02-19 MED ORDER — HYDROMORPHONE HCL 1 MG/ML IJ SOLN
0.5000 mg | INTRAMUSCULAR | Status: DC | PRN
  Administered 2021-02-20 – 2021-02-25 (×12): 1 mg via INTRAVENOUS
  Administered 2021-02-25 (×2): 0.5 mg via INTRAVENOUS
  Administered 2021-02-25 – 2021-02-26 (×3): 1 mg via INTRAVENOUS
  Filled 2021-02-19 (×18): qty 1

## 2021-02-19 MED ORDER — NOREPINEPHRINE 4 MG/250ML-% IV SOLN
INTRAVENOUS | Status: AC
  Administered 2021-02-19: 1 ug/min via INTRAVENOUS
  Filled 2021-02-19: qty 250

## 2021-02-19 MED ORDER — NOREPINEPHRINE 4 MG/250ML-% IV SOLN: 0.0000 ug/min | INTRAVENOUS | Status: DC

## 2021-02-19 MED ORDER — PIPERACILLIN-TAZOBACTAM 3.375 G IVPB 30 MIN
3.3750 g | Freq: Once | INTRAVENOUS | Status: AC
  Administered 2021-02-19: 3.375 g via INTRAVENOUS
  Filled 2021-02-19: qty 50

## 2021-02-19 MED ORDER — PIPERACILLIN-TAZOBACTAM 3.375 G IVPB
3.3750 g | Freq: Three times a day (TID) | INTRAVENOUS | Status: AC
  Administered 2021-02-20 – 2021-03-08 (×50): 3.375 g via INTRAVENOUS
  Filled 2021-02-19 (×49): qty 50

## 2021-02-19 MED ORDER — SODIUM CHLORIDE 0.9 % IV SOLN
12.5000 mg | Freq: Once | INTRAVENOUS | Status: AC
  Administered 2021-02-20: 12.5 mg via INTRAVENOUS
  Filled 2021-02-19: qty 0.5

## 2021-02-19 MED ORDER — LACTATED RINGERS IV BOLUS
30.0000 mL/kg | Freq: Once | INTRAVENOUS | Status: AC
  Administered 2021-02-19: 2382 mL via INTRAVENOUS

## 2021-02-19 NOTE — Consult Note (Signed)
Travis Conrad 05-31-1951  [id number].    Requesting MD: Dr. Roslynn Amble Chief Complaint/Reason for Consult: perforated diverticulitis  HPI:  Travis Conrad is a 70 year old male who presented to the ED today with several days of abdominal pain, nausea, and vomiting.  He started having lower abdominal cramping several days ago that is progressed to diffuse abdominal pain.  He has also had nausea and vomiting with very poor p.o. intake for the last 2 to 3 days.  He has had very small loose bowel movements over the last few days.  He has had subjective chills but no fevers.  On presentation to the ED he was mildly hypotensive with systolic blood pressures in the 70s.  Labs were significant for a white count of 21, and an AKI with a creatinine of 3.2.  He had a CT abdomen pelvis which showed inflammation of the sigmoid colon and a small amount of extraluminal gas, consistent with perforated diverticulitis.  He reports that his last colonoscopy was 7-8 years ago.  He was given a dose of Zosyn in the [facility] ED as well as several liters of IV fluid and started on low-dose Levophed prior to transfer to the [facility] ED. He is now off Levophed with a systolic blood pressure of 90.  Medical history includes a-fib for which he underwent an ablation in April 2021.  He is on Xarelto and took his last dose yesterday evening.  He is currently in atrial fibrillation.  ROS: Review of Systems  Constitutional:  Positive for chills and malaise/fatigue. Negative for fever.  HENT:  Negative for sore throat.   Eyes:  Negative for redness.  Respiratory:  Negative for shortness of breath, wheezing and stridor.   Cardiovascular:  Positive for palpitations.  Gastrointestinal:  Positive for abdominal pain, nausea and vomiting.  Genitourinary:        Decreased UOP  Neurological:  Positive for weakness.   Family History  Problem Relation Age of Onset   Other Brother     Past Medical History:  Diagnosis Date   Atrial  fibrillation ([place])    High cholesterol    "RX made groin hurt" (08/14/2016)   Hypertension     Past Surgical History:  Procedure Laterality Date   ANKLE FRACTURE SURGERY Left ~ 2012   "put rod and pins in"   ATRIAL FIBRILLATION ABLATION N/A 10/03/2019   Procedure: ATRIAL FIBRILLATION ABLATION;  Surgeon: Constance Haw, MD;  Location: [place] [facility];  Service: Cardiovascular;  Laterality: N/A;   COLONOSCOPY W/ BIOPSIES AND POLYPECTOMY  ~ 2000   FRACTURE SURGERY     ORIF FIBULA FRACTURE  06/24/2012   Procedure: OPEN REDUCTION INTERNAL FIXATION (ORIF) FIBULA FRACTURE;  Surgeon: [name], MD;  Location: [place];  Service: Orthopedics;  Laterality: Left;  ORIF LEFT FIBULA,  POSSIBLE REPAIR OF SYNDESMOSIS    TEE WITHOUT CARDIOVERSION N/A 10/03/2019   Procedure: TRANSESOPHAGEAL ECHOCARDIOGRAM (TEE);  Surgeon: Constance Haw, MD;  Location: [place] [facility];  Service: Cardiovascular;  Laterality: N/A;    Social History:  reports that he has been smoking cigarettes. He has a 50.00 pack-year smoking history. He has never used smokeless tobacco. He reports that he does not currently use alcohol. He reports current drug use. Drugs: Cocaine and Marijuana.  Allergies:  Allergies  Allergen Reactions   Atorvastatin Other (See Comments)    myalgias    (Not in a hospital admission)    Physical Exam: Blood pressure 106/77, pulse  90, temperature 98 F (36.7 C), resp. rate 13, height 6' (1.829 m), weight 79.4 kg, SpO2 98 %. General: resting comfortably, appears stated age, no apparent distress Neurological: alert and oriented, no focal deficits, cranial nerves grossly in tact HEENT: normocephalic, atraumatic, oropharynx clear, no scleral icterus CV: irregular rhythm, extremities warm and well-perfused Respiratory: normal work of breathing on room air, symmetric chest wall expansion Abdomen: soft, mildly distended, mildly tender to palpation across lower abdomen but no rebound  tenderness or guarding. No surgical scars. No masses or organomegaly. Extremities: warm and well-perfused, no deformities, moving all extremities spontaneously Psychiatric: normal mood and affect Skin: warm and dry, no jaundice, no rashes or lesions   Results for orders placed or performed during the hospital encounter of 02/19/21 (from the past 48 hour(s))  Urinalysis, Routine w reflex microscopic     Status: Abnormal   Collection Time: 02/19/21  4:45 PM  Result Value Ref Range   Color, Urine BROWN (A) YELLOW    Comment: BIOCHEMICALS MAY BE AFFECTED BY COLOR   APPearance CLOUDY (A) CLEAR   Specific Gravity, Urine 1.026 1.005 - 1.030   pH 5.5 5.0 - 8.0   Glucose, UA NEGATIVE NEGATIVE mg/dL   Hgb urine dipstick LARGE (A) NEGATIVE   Bilirubin Urine SMALL (A) NEGATIVE   Ketones, ur NEGATIVE NEGATIVE mg/dL   Protein, ur >300 (A) NEGATIVE mg/dL   Nitrite NEGATIVE NEGATIVE   Leukocytes,Ua NEGATIVE NEGATIVE   RBC / HPF >50 (H) 0 - 5 RBC/hpf   WBC, UA 0-5 0 - 5 WBC/hpf   Bacteria, UA FEW (A) NONE SEEN   Hyaline Casts, UA PRESENT    Granular Casts, UA PRESENT    Amorphous Crystal PRESENT     Comment: Performed at [facility], [place], [place] [address]  Lipase, blood     Status: None   Collection Time: 02/19/21  4:53 PM  Result Value Ref Range   Lipase 22 11 - 51 U/L    Comment: Performed at [facility], [place], [place] [address]  Comprehensive metabolic panel     Status: Abnormal   Collection Time: 02/19/21  4:53 PM  Result Value Ref Range   Sodium 131 (L) 135 - 145 mmol/L   Potassium 3.2 (L) 3.5 - 5.1 mmol/L   Chloride 88 (L) 98 - 111 mmol/L   CO2 25 22 - 32 mmol/L   Glucose, Bld 137 (H) 70 - 99 mg/dL    Comment: Glucose reference range applies only to samples taken after fasting for at least 8 hours.   BUN 38 (H) 8 - 23 mg/dL   Creatinine, Ser 3.23 (H) 0.61 - 1.24 mg/dL   Calcium 11.0 (H) 8.9 - 10.3  mg/dL   Total Protein 7.6 6.5 - 8.1 g/dL   Albumin 3.9 3.5 - 5.0 g/dL   AST 18 15 - 41 U/L   ALT 11 0 - 44 U/L   Alkaline Phosphatase 85 38 - 126 U/L   Total Bilirubin 1.1 0.3 - 1.2 mg/dL   GFR, Estimated 20 (L) >60 mL/min    Comment: (NOTE) Calculated using the CKD-EPI Creatinine Equation (2021)    Anion gap 18 (H) 5 - 15    Comment: Performed at [facility], [address], [place], [place] [address]  CBC     Status: Abnormal   Collection Time: 02/19/21  4:53 PM  Result Value Ref Range   WBC 21.8 (H) 4.0 - 10.5 K/uL  RBC 4.66 4.22 - 5.81 MIL/uL   Hemoglobin 16.0 13.0 - 17.0 g/dL   HCT 44.8 39.0 - 52.0 %   MCV 96.1 80.0 - 100.0 fL   MCH 34.3 (H) 26.0 - 34.0 pg   MCHC 35.7 30.0 - 36.0 g/dL   RDW 12.6 11.5 - 15.5 %   Platelets 254 150 - 400 K/uL   nRBC 0.0 0.0 - 0.2 %    Comment: Performed at [facility], [address] [place], [place] [address]  Differential     Status: Abnormal   Collection Time: 02/19/21  6:38 PM  Result Value Ref Range   Neutrophils Relative % 84 %   Neutro Abs 18.3 (H) 1.7 - 7.7 K/uL   Lymphocytes Relative 8 %   Lymphs Abs 1.7 0.7 - 4.0 K/uL   Monocytes Relative 7 %   Monocytes Absolute 1.6 (H) 0.1 - 1.0 K/uL   Eosinophils Relative 0 %   Eosinophils Absolute 0.0 0.0 - 0.5 K/uL   Basophils Relative 0 %   Basophils Absolute 0.1 0.0 - 0.1 K/uL   Immature Granulocytes 1 %   Abs Immature Granulocytes 0.20 (H) 0.00 - 0.07 K/uL    Comment: Performed at [facility], [address] [place], [place] [address]  Lactic acid, plasma     Status: None   Collection Time: 02/19/21  6:38 PM  Result Value Ref Range   Lactic Acid, Venous 1.3 0.5 - 1.9 mmol/L    Comment: Performed at [facility], [address], [place], [place] [address]  Protime-INR     Status: None   Collection Time: 02/19/21  6:38 PM  Result Value Ref Range   Prothrombin Time 14.6 11.4 - 15.2 seconds   INR  1.1 0.8 - 1.2    Comment: (NOTE) INR goal varies based on device and disease states. Performed at [facility], [address] [place], [place] [address]   Magnesium     Status: None   Collection Time: 02/19/21  6:38 PM  Result Value Ref Range   Magnesium 2.0 1.7 - 2.4 mg/dL    Comment: Performed at [facility], [address], [place], [place] [address]  Phosphorus     Status: Abnormal   Collection Time: 02/19/21  6:38 PM  Result Value Ref Range   Phosphorus 4.8 (H) 2.5 - 4.6 mg/dL    Comment: Performed at [facility], [address], [place], [place] [address]  Resp Panel by RT-PCR (Flu A&B, Covid)     Status: None   Collection Time: 02/19/21  6:38 PM   Specimen: Nasopharyngeal(NP) swabs in vial transport medium  Result Value Ref Range   SARS Coronavirus 2 by RT PCR NEGATIVE NEGATIVE    Comment: (NOTE) SARS-CoV-2 target nucleic acids are NOT DETECTED.  The SARS-CoV-2 RNA is generally detectable in upper respiratory specimens during the acute phase of infection. The lowest concentration of SARS-CoV-2 viral copies this assay can detect is 138 copies/mL. A negative result does not preclude SARS-Cov-2 infection and should not be used as the sole basis for treatment or other patient management decisions. A negative result may occur with  improper specimen collection/handling, submission of specimen other than nasopharyngeal swab, presence of viral mutation(s) within the areas targeted by this assay, and inadequate number of viral copies(<138 copies/mL). A negative result must be combined with clinical observations, patient history, and epidemiological information. The expected result is Negative.  Fact Sheet for Patients:  EntrepreneurPulse.com.au  Fact  Sheet for Healthcare Providers:  IncredibleEmployment.be  This test is no t yet approved or cleared by the [place] FDA and   has been authorized for detection and/or diagnosis of SARS-CoV-2 by FDA under an Emergency Use Authorization (EUA). This EUA will remain  in effect (meaning this test can be used) for the duration of the COVID-19 declaration under Section 564(b)(1) of the Act, 21 U.S.C.section 360bbb-3(b)(1), unless the authorization is terminated  or revoked sooner.       Influenza A by PCR NEGATIVE NEGATIVE   Influenza B by PCR NEGATIVE NEGATIVE    Comment: (NOTE) The Xpert Xpress SARS-CoV-2/FLU/RSV plus assay is intended as an aid in the diagnosis of influenza from Nasopharyngeal swab specimens and should not be used as a sole basis for treatment. Nasal washings and aspirates are unacceptable for Xpert Xpress SARS-CoV-2/FLU/RSV testing.  Fact Sheet for Patients: EntrepreneurPulse.com.au  Fact Sheet for Healthcare Providers: IncredibleEmployment.be  This test is not yet approved or cleared by the [place] FDA and has been authorized for detection and/or diagnosis of SARS-CoV-2 by FDA under an Emergency Use Authorization (EUA). This EUA will remain in effect (meaning this test can be used) for the duration of the COVID-19 declaration under Section 564(b)(1) of the Act, 21 U.S.C. section 360bbb-3(b)(1), unless the authorization is terminated or revoked.  Performed at [facility], [address], [place], [place] [address]    CT Abdomen Pelvis Wo Contrast  Result Date: 02/19/2021 CLINICAL DATA:  Constipation, abdominal pain, not urinating much, decreased renal function on labs today, suspected intra-abdominal infection/abscess, history atrial fibrillation, hypertension, smoker EXAM: CT ABDOMEN AND PELVIS WITHOUT CONTRAST TECHNIQUE: Multidetector CT imaging of the abdomen and pelvis was performed following the standard protocol without IV contrast. Sagittal and coronal MPR images reconstructed from axial data set. No oral contrast  administered. 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 Adrenal glands, kidneys, and ureters otherwise normal appearance. Bladder decompressed. Stomach/Bowel: Normal appendix. Scattered colonic diverticulosis. Wall thickening at sigmoid colon with infiltration of pericolic fat consistent with acute diverticulitis. Small extraluminal gas and fluid collection identified consistent with perforation and abscess, collection measuring 3.5 x 1.5 x 2.7 cm. Small duodenal diverticulum. Dilated proximal and decompressed distal small bowel loops, question ileus, no discrete transition zone identified. Stomach unremarkable. Vascular/Lymphatic: Atherosclerotic calcifications aorta and iliac arteries without aneurysm. Scattered pelvic phleboliths. Reproductive: Mild prostatic enlargement. Seminal vesicles unremarkable. Other: No free air or free fluid.  No hernia. Musculoskeletal: Bones demineralized. IMPRESSION: Acute sigmoid diverticulitis with a small extraluminal gas and fluid collection in the sigmoid mesocolon 3.5 x 1.5 x 2.7 cm consistent with perforation and abscess. Dilated proximal and decompressed distal small bowel loops, question ileus. Mild prostatic enlargement. 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 Aortic Atherosclerosis (ICD10-I70.0). Findings called to Dr.  Johnney Killian on 02/19/2021 at 1847 hours. Electronically Signed   By: Lavonia Dana M.D.   On: 02/19/2021 18:48   DG Chest Port 1 View  Result Date: 02/19/2021 CLINICAL DATA:  Abdominal pain and wheezing EXAM: PORTABLE CHEST 1 VIEW COMPARISON:  Portable exam 1824 hours compared to 06/28/2020 FINDINGS: Normal heart size, mediastinal contours, and pulmonary vascularity. Atherosclerotic calcification aorta. Lungs clear. No pulmonary infiltrate, pleural effusion,  or pneumothorax. Osseous structures unremarkable. IMPRESSION: No acute abnormalities. Aortic Atherosclerosis (ICD10-I70.0). Electronically Signed   By: Lavonia Dana M.D.   On: 02/19/2021 18:49  Assessment/Plan This is a 70 year old male presenting with sepsis secondary to acute perforated sigmoid diverticulitis.  I reviewed his CT scan which shows thickening of the sigmoid colon and small foci of extraluminal gas consistent with a contained perforation.  He does not have any large volume free air and there does not appear to be a drainable abscess.  The small bowel is dilated proximally and there appears to be a transition adjacent to the inflamed sigmoid colon.  Because the perforation appears contained and the patient does not have clinical signs of peritonitis, I do not recommend an emergent surgical intervention at this time.  The patient's hemodynamics have improved with IV fluid resuscitation.  I discussed with the patient and his significant other that I would recommend nonoperative management at this time with fluids, IV antibiotics and bowel rest.  If his condition worsens or if he develops diffuse peritonitis he would require a sigmoid colectomy and end colostomy.  - NPO, continue IV fluid hydration - Zosyn - Imaging suggests a reactive ileus vs partial obstruction. If patient has persistent emesis he may need NG tube placement.  - Hold Xarelto in case of need for an urgent procedure - If nonoperative management is successful, patient should have an outpatient colonoscopy in 6 weeks. - Surgery will follow closely   Michaelle Birks, [facility], Hepatobiliary and Pancreatic Surgery 02/19/21 9:30 PM

## 2021-02-19 NOTE — ED Provider Notes (Signed)
Travis Conrad is a 70 y.o. male who presents as a transfer from OSH for further management of perforated diverticulitis with abscess.  The patient reports a 3-day history of left-sided abdominal pain with associated chills, nausea, and vomiting.  His pain worsened today prompting him to present to the emergency department for further evaluation.    Physical Exam  BP 113/79   Pulse 87   Temp 98 F (36.7 C) (Oral)   Resp 17   Ht 6' (1.829 m)   Wt 79.4 kg   SpO2 91%   BMI 23.74 kg/m   Physical Exam Vitals and nursing note reviewed.  Constitutional:      General: He is not in acute distress.    Appearance: He is well-developed.  HENT:     Head: Normocephalic and atraumatic.  Eyes:     Conjunctiva/sclera: Conjunctivae normal.  Cardiovascular:     Rate and Rhythm: Normal rate and regular rhythm.     Heart sounds: No murmur heard. Pulmonary:     Effort: Pulmonary effort is normal. No respiratory distress.     Breath sounds: Normal breath sounds.  Abdominal:     Palpations: Abdomen is soft.     Tenderness: There is abdominal tenderness in the left upper quadrant and left lower quadrant.  Musculoskeletal:     Cervical back: Neck supple.  Skin:    General: Skin is warm and dry.     Capillary Refill: Capillary refill takes less than 2 seconds.  Neurological:     General: No focal deficit present.     Mental Status: He is alert and oriented to person, place, and time.    ED Course/Procedures   Clinical Course as of 02/21/21 0029  Sat Feb 19, 2021  1911 Consult: Reviewed with Dr. Michaelle Birks general surgery.  Requests ED to ED transfer for expedited evaluation of perforated diverticulitis with sepsis profile. [MP]    Clinical Course User Index [MP] Charlesetta Shanks, MD    Procedures  MDM  70 y.o. male presents from OSH for further evaluation of perforated diverticulitis with abscess.  Afebrile and hemodynamically stable on 1 of Levophed, which was stopped upon  arrival.  Labs from OSH are notable for leukocytosis with left shift with WBC 21.8 and absolute neutrophil count 18.3.  CMP with mild hyponatremia 131, mild hypokalemia with potassium 3.2, AKI on CKD with creatinine 3.23 (baseline 1.3), normal lactic acid.  CT abdomen pelvis demonstrated acute sigmoid diverticulitis with perforation and abscess.  He was given Zosyn and 30 cc/kg IV fluids prior to transfer.  Surgery was consulted, who accepted the patient in transfer.  Upon arrival to our emergency department, Dr. Zenia Resides of surgery was consulted and evaluated the patient at bedside.  They will plan to trial nonoperative management at this time with fluids, IV antibiotics, and bowel rest.  Hospitalist was consulted for admission.    Violet Baldy, MD 02/21/21 [id number]    Lucrezia Starch, MD 02/22/21 [phone]

## 2021-02-19 NOTE — Progress Notes (Addendum)
Pharmacy Antibiotic Note  Travis Conrad is a 70 y.o. male admitted on 02/19/2021 with  IAI .  Pharmacy has been consulted for zosyn dosing.  Plan: Zosyn 3.375g IV q8h (4 hour infusion). -Monitor renal function, clinical status, and antibiotic plan  Height: 6' (182.9 cm) Weight: 79.4 kg (175 lb 0.7 oz) IBW/kg (Calculated) : 77.6  Temp (24hrs), Avg:98 F (36.7 C), Min:98 F (36.7 C), Max:98 F (36.7 C)  Recent Labs  Lab 02/19/21 1653  WBC 21.8*  CREATININE 3.23*    Estimated Creatinine Clearance: 23.4 mL/min (A) (by C-G formula based on SCr of 3.23 mg/dL (H)).    Allergies  Allergen Reactions   Atorvastatin Other (See Comments)    myalgias    Antimicrobials this admission: Zosyn 9/3 >>   Microbiology results: 9/3 BCx:   Thank you for allowing pharmacy to be a part of this patient's care.  Joetta Manners, PharmD, [facility] Clinical Pharmacist ED RPh Phone: [place]: [phone]

## 2021-02-19 NOTE — ED Provider Notes (Signed)
[facility] EMERGENCY DEPT Provider Note   CSN: [id number] Arrival date & time: 02/19/21  1613     History Chief Complaint  Patient presents with   Abdominal Pain    Travis Conrad is a 70 y.o. male.  HPI Patient reports he has had abdominal pain with nausea and vomiting for about 3 days.  Vomit has had a yellow appearance.  No blood.  He has been trying to take fluids but has had such bad nausea that he has been able to drink very little.  Patient reports he is only had very small amounts of stool no diarrhea.  He has abdominal pain that started in the lower abdomen but has been migrating towards his upper abdomen.  He has had chills but not documented fever.    Past Medical History:  Diagnosis Date   Atrial fibrillation ([place])    High cholesterol    "RX made groin hurt" (08/14/2016)   Hypertension     Patient Active Problem List   Diagnosis Date Noted   Secondary hypercoagulable state ([place]) 11/03/2019   Acute diverticulitis 11/19/2016   Atrial fibrillation, chronic ([place]) 11/19/2016   Elevated cholesterol 11/19/2016   Essential hypertension 11/19/2016   Paroxysmal atrial fibrillation ([place]) 08/14/2016    Past Surgical History:  Procedure Laterality Date   ANKLE FRACTURE SURGERY Left ~ 2012   "put rod and pins in"   ATRIAL FIBRILLATION ABLATION N/A 10/03/2019   Procedure: ATRIAL FIBRILLATION ABLATION;  Surgeon: Constance Haw, MD;  Location: [facility];  Service: Cardiovascular;  Laterality: N/A;   COLONOSCOPY W/ BIOPSIES AND POLYPECTOMY  ~ 2000   FRACTURE SURGERY     ORIF FIBULA FRACTURE  06/24/2012   Procedure: OPEN REDUCTION INTERNAL FIXATION (ORIF) FIBULA FRACTURE;  Surgeon: [name], MD;  Location: [facility];  Service: Orthopedics;  Laterality: Left;  ORIF LEFT FIBULA,  POSSIBLE REPAIR OF SYNDESMOSIS    TEE WITHOUT CARDIOVERSION N/A 10/03/2019   Procedure: TRANSESOPHAGEAL ECHOCARDIOGRAM (TEE);  Surgeon: Constance Haw, MD;   Location: [facility];  Service: Cardiovascular;  Laterality: N/A;       Family History  Problem Relation Age of Onset   Other Brother     Social History   Tobacco Use   Smoking status: Every Day    Packs/day: 1.00    Years: 50.00    Pack years: 50.00    Types: Cigarettes   Smokeless tobacco: Never  Vaping Use   Vaping Use: Never used  Substance Use Topics   Alcohol use: Not Currently    Comment: 1/2 gallon of whiskey a week   Drug use: Yes    Types: Cocaine, Marijuana    Comment: 08/14/2016 "last cocaine was in early 1990s; smoked pot yesterday"    Home Medications Prior to Admission medications   Medication Sig Start Date End Date Taking? Authorizing Provider  COVID-19 mRNA vaccine, Pfizer, 30 MCG/0.3ML injection INJECT AS DIRECTED 04/05/20 04/05/21  Carlyle Basques, MD  diltiazem (DILT-XR) 240 MG 24 hr capsule Take 1 capsule by mouth daily with supper. Patient overdue for an appointment with Dr [name] and needs to call and schedule for further refills 1st attempt 02/08/21   Fay Records, MD  HYDROcodone-acetaminophen [facility]) 5-325 MG tablet Take 1-2 tablets by mouth every 6 (six) hours as needed. 06/28/20   Jacqlyn Larsen, PA-C  losartan-hydrochlorothiazide (HYZAAR) 100-25 MG tablet Take 1 tablet by mouth daily. Please schedule overdue appointment for future refills. 3rd and final  attempt. 10/26/20   Fay Records, MD  rosuvastatin (CRESTOR) 20 MG tablet TAKE 1 TABLET(20 MG) BY MOUTH DAILY 04/28/20   Fay Records, MD  sotalol (BETAPACE) 120 MG tablet TAKE 1 TABLET(120 MG) BY MOUTH EVERY 12 HOURS 08/11/20   Fay Records, MD  XARELTO 20 MG TABS tablet TAKE 1 TABLET BY MOUTH EVERY DAY WITH DINNER 09/10/20   Camnitz, Ocie Doyne, MD    Allergies    Atorvastatin  Review of Systems   Review of Systems 10 systems reviewed negative except as per HPI Physical Exam Updated Vital Signs BP 98/75   Pulse (!) 35   Temp 98 F (36.7 C)   Resp 18   Ht 6' (1.829 m)   Wt 79.4  kg   SpO2 95%   BMI 23.74 kg/m   Physical Exam Constitutional:      Comments: Patient is alert.  Nontoxic.  Mental status clear.  Mildly ill in appearance.  HENT:     Head: Normocephalic and atraumatic.     Mouth/Throat:     Pharynx: Oropharynx is clear.  Eyes:     Extraocular Movements: Extraocular movements intact.  Cardiovascular:     Rate and Rhythm: Normal rate and regular rhythm.  Pulmonary:     Comments: No respiratory distress.  Extensive coarse wheeze bilateral lower lung fields. Abdominal:     Comments: Moderate to severe pain midline lower and central abdomen.  Musculoskeletal:        General: No swelling or tenderness. Normal range of motion.     Right lower leg: No edema.     Left lower leg: No edema.  Skin:    General: Skin is warm and dry.  Neurological:     General: No focal deficit present.     Mental Status: He is oriented to person, place, and time.     Cranial Nerves: No cranial nerve deficit.     Coordination: Coordination normal.  Psychiatric:        Mood and Affect: Mood normal.    ED Results / Procedures / Treatments   Labs (all labs ordered are listed, but only abnormal results are displayed) Labs Reviewed  COMPREHENSIVE METABOLIC PANEL - Abnormal; Notable for the following components:      Result Value   Sodium 131 (*)    Potassium 3.2 (*)    Chloride 88 (*)    Glucose, Bld 137 (*)    BUN 38 (*)    Creatinine, Ser 3.23 (*)    Calcium 11.0 (*)    GFR, Estimated 20 (*)    Anion gap 18 (*)    All other components within normal limits  CBC - Abnormal; Notable for the following components:   WBC 21.8 (*)    MCH 34.3 (*)    All other components within normal limits  URINALYSIS, ROUTINE W REFLEX MICROSCOPIC - Abnormal; Notable for the following components:   Color, Urine BROWN (*)    APPearance CLOUDY (*)    Hgb urine dipstick LARGE (*)    Bilirubin Urine SMALL (*)    Protein, ur >300 (*)    RBC / HPF >50 (*)    Bacteria, UA FEW (*)     All other components within normal limits  DIFFERENTIAL - Abnormal; Notable for the following components:   Neutro Abs 18.3 (*)    Monocytes Absolute 1.6 (*)    Abs Immature Granulocytes 0.20 (*)    All other components within normal limits  PHOSPHORUS - Abnormal; Notable for the following components:   Phosphorus 4.8 (*)    All other components within normal limits  RESP PANEL BY RT-PCR (FLU A&B, COVID) ARPGX2  CULTURE, BLOOD (ROUTINE X 2)  CULTURE, BLOOD (ROUTINE X 2)  LIPASE, BLOOD  LACTIC ACID, PLASMA  PROTIME-INR  MAGNESIUM  LACTIC ACID, PLASMA    EKG EKG Interpretation  Date/Time:  Saturday February 19 2021 18:45:48 EDT Ventricular Rate:  97 PR Interval:    QRS Duration: 99 QT Interval:  462 QTC Calculation: 495 R Axis:   75 Text Interpretation: Atrial fibrillation Borderline repolarization abnormality Borderline prolonged QT interval atrial fib not present on previous but known chronic, QRS morphology unchanged Confirmed by Charlesetta Shanks [phone]) on 02/19/2021 7:31:23 PM Also confirmed by Charlesetta Shanks [phone]), editor [place], LaVerne [phone])  on 02/20/2021 8:59:13 AM  Radiology CT Abdomen Pelvis Wo Contrast  Result Date: 02/19/2021 CLINICAL DATA:  Constipation, abdominal pain, not urinating much, decreased renal function on labs today, suspected intra-abdominal infection/abscess, history atrial fibrillation, hypertension, smoker EXAM: CT ABDOMEN AND PELVIS WITHOUT CONTRAST TECHNIQUE: Multidetector CT imaging of the abdomen and pelvis was performed following the standard protocol without IV contrast. Sagittal and coronal MPR images reconstructed from axial data set. No oral contrast administered. 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 Adrenal glands, kidneys, and ureters  otherwise normal appearance. Bladder decompressed. Stomach/Bowel: Normal appendix. Scattered colonic diverticulosis. Wall thickening at sigmoid colon with infiltration of pericolic fat consistent with acute diverticulitis. Small extraluminal gas and fluid collection identified consistent with perforation and abscess, collection measuring 3.5 x 1.5 x 2.7 cm. Small duodenal diverticulum. Dilated proximal and decompressed distal small bowel loops, question ileus, no discrete transition zone identified. Stomach unremarkable. Vascular/Lymphatic: Atherosclerotic calcifications aorta and iliac arteries without aneurysm. Scattered pelvic phleboliths. Reproductive: Mild prostatic enlargement. Seminal vesicles unremarkable. Other: No free air or free fluid.  No hernia. Musculoskeletal: Bones demineralized. IMPRESSION: Acute sigmoid diverticulitis with a small extraluminal gas and fluid collection in the sigmoid mesocolon 3.5 x 1.5 x 2.7 cm consistent with perforation and abscess. Dilated proximal and decompressed distal small bowel loops, question ileus. Mild prostatic enlargement. 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 Aortic Atherosclerosis (ICD10-I70.0). Findings called to Dr.  Johnney Killian on 02/19/2021 at 1847 hours. Electronically Signed   By: Lavonia Dana M.D.   On: 02/19/2021 18:48   DG Chest Port 1 View  Result Date: 02/19/2021 CLINICAL DATA:  Abdominal pain and wheezing EXAM: PORTABLE CHEST 1 VIEW COMPARISON:  Portable exam 1824 hours compared to 06/28/2020 FINDINGS: Normal heart size, mediastinal contours, and pulmonary vascularity. Atherosclerotic calcification aorta. Lungs clear. No pulmonary infiltrate, pleural effusion, or pneumothorax. Osseous structures unremarkable. IMPRESSION: No acute abnormalities. Aortic Atherosclerosis (ICD10-I70.0). Electronically Signed   By: Lavonia Dana M.D.   On: 02/19/2021 18:49    Procedures Procedures  CRITICAL CARE Performed by: Charlesetta Shanks   Total critical care  time: 30 minutes  Critical care time was exclusive of separately billable procedures and treating other patients.  Critical care was necessary to treat or prevent imminent or life-threatening deterioration.  Critical care was time spent personally by me on the following activities: development of treatment plan with patient and/or surrogate as well as nursing, discussions with consultants, evaluation of patient's response to treatment, examination of patient, obtaining history from patient or surrogate, ordering and performing treatments and interventions, ordering  and review of laboratory studies, ordering and review of radiographic studies, pulse oximetry and re-evaluation of patient's condition.  Medications Ordered in ED Medications  lactated ringers infusion (has no administration in time range)  lactated ringers bolus 1,000 mL (has no administration in time range)  piperacillin-tazobactam (ZOSYN) IVPB 3.375 g (has no administration in time range)  lactated ringers bolus 2,382 mL (2,382 mLs Intravenous New Bag/Given 02/19/21 1855)  piperacillin-tazobactam (ZOSYN) IVPB 3.375 g (3.375 g Intravenous New Bag/Given 02/19/21 1852)  ondansetron (ZOFRAN) injection 4 mg (4 mg Intravenous Given 02/19/21 1851)  HYDROmorphone (DILAUDID) injection 1 mg (1 mg Intravenous Given 02/19/21 1851)    ED Course  I have reviewed the triage vital signs and the nursing notes.  Pertinent labs & imaging results that were available during my care of the patient were reviewed by me and considered in my medical decision making (see chart for details).  Clinical Course as of 02/25/21 0001  Sat Feb 19, 2021  1911 Consult: Reviewed with Dr. Michaelle Birks general surgery.  Requests ED to ED transfer for expedited evaluation of perforated diverticulitis with sepsis profile. [MP]    Clinical Course User Index [MP] Charlesetta Shanks, MD   MDM Rules/Calculators/A&P                           Patient presents with severe  abdominal pain.  CT identifies diverticulitis with likely abscess.  Patient exhibited tachycardia and hypotension consistent with sepsis.  Sepsis protocol was initiated. Final Clinical Impression(s) / ED Diagnoses Final diagnoses:  Diverticulitis  Sepsis with acute renal failure, due to unspecified organism, unspecified acute renal failure type, unspecified whether septic shock present [facility])    Rx / DC Orders ED Discharge Orders     None        Charlesetta Shanks, MD 02/25/21 0003

## 2021-02-19 NOTE — Sepsis Progress Note (Signed)
Following per sepsis protocol   

## 2021-02-19 NOTE — ED Notes (Signed)
Dr Allen at bedside  

## 2021-02-19 NOTE — ED Notes (Signed)
Pt is A fib therefore HR reading 35 with pulse ox, ekg mode 91.

## 2021-02-19 NOTE — ED Triage Notes (Signed)
Pt feels constipated/ having abdominal pain/ not sure he is emptying his bladder (not urinating much ).

## 2021-02-20 ENCOUNTER — Inpatient Hospital Stay (HOSPITAL_COMMUNITY): Payer: Medicare Other

## 2021-02-20 DIAGNOSIS — R109 Unspecified abdominal pain: Secondary | ICD-10-CM | POA: Diagnosis present

## 2021-02-20 DIAGNOSIS — F4323 Adjustment disorder with mixed anxiety and depressed mood: Secondary | ICD-10-CM | POA: Diagnosis not present

## 2021-02-20 DIAGNOSIS — D539 Nutritional anemia, unspecified: Secondary | ICD-10-CM | POA: Diagnosis present

## 2021-02-20 DIAGNOSIS — K5780 Diverticulitis of intestine, part unspecified, with perforation and abscess without bleeding: Secondary | ICD-10-CM | POA: Diagnosis not present

## 2021-02-20 DIAGNOSIS — K5901 Slow transit constipation: Secondary | ICD-10-CM | POA: Diagnosis not present

## 2021-02-20 DIAGNOSIS — K6812 Psoas muscle abscess: Secondary | ICD-10-CM | POA: Diagnosis present

## 2021-02-20 DIAGNOSIS — D1779 Benign lipomatous neoplasm of other sites: Secondary | ICD-10-CM | POA: Diagnosis present

## 2021-02-20 DIAGNOSIS — D638 Anemia in other chronic diseases classified elsewhere: Secondary | ICD-10-CM | POA: Diagnosis present

## 2021-02-20 DIAGNOSIS — N179 Acute kidney failure, unspecified: Secondary | ICD-10-CM | POA: Diagnosis present

## 2021-02-20 DIAGNOSIS — A419 Sepsis, unspecified organism: Secondary | ICD-10-CM | POA: Diagnosis present

## 2021-02-20 DIAGNOSIS — R6520 Severe sepsis without septic shock: Secondary | ICD-10-CM | POA: Diagnosis present

## 2021-02-20 DIAGNOSIS — I480 Paroxysmal atrial fibrillation: Secondary | ICD-10-CM | POA: Diagnosis not present

## 2021-02-20 DIAGNOSIS — M25561 Pain in right knee: Secondary | ICD-10-CM | POA: Diagnosis not present

## 2021-02-20 DIAGNOSIS — K651 Peritoneal abscess: Secondary | ICD-10-CM | POA: Diagnosis not present

## 2021-02-20 DIAGNOSIS — I10 Essential (primary) hypertension: Secondary | ICD-10-CM

## 2021-02-20 DIAGNOSIS — E46 Unspecified protein-calorie malnutrition: Secondary | ICD-10-CM | POA: Diagnosis present

## 2021-02-20 DIAGNOSIS — R609 Edema, unspecified: Secondary | ICD-10-CM | POA: Diagnosis not present

## 2021-02-20 DIAGNOSIS — R5381 Other malaise: Secondary | ICD-10-CM | POA: Diagnosis not present

## 2021-02-20 DIAGNOSIS — K5792 Diverticulitis of intestine, part unspecified, without perforation or abscess without bleeding: Secondary | ICD-10-CM | POA: Diagnosis not present

## 2021-02-20 DIAGNOSIS — N451 Epididymitis: Secondary | ICD-10-CM | POA: Diagnosis not present

## 2021-02-20 DIAGNOSIS — I4892 Unspecified atrial flutter: Secondary | ICD-10-CM | POA: Diagnosis present

## 2021-02-20 DIAGNOSIS — D62 Acute posthemorrhagic anemia: Secondary | ICD-10-CM | POA: Diagnosis not present

## 2021-02-20 DIAGNOSIS — D72829 Elevated white blood cell count, unspecified: Secondary | ICD-10-CM | POA: Diagnosis not present

## 2021-02-20 DIAGNOSIS — Z1621 Resistance to vancomycin: Secondary | ICD-10-CM | POA: Diagnosis present

## 2021-02-20 DIAGNOSIS — R443 Hallucinations, unspecified: Secondary | ICD-10-CM | POA: Diagnosis not present

## 2021-02-20 DIAGNOSIS — Z79899 Other long term (current) drug therapy: Secondary | ICD-10-CM | POA: Diagnosis not present

## 2021-02-20 DIAGNOSIS — K56600 Partial intestinal obstruction, unspecified as to cause: Secondary | ICD-10-CM | POA: Diagnosis present

## 2021-02-20 DIAGNOSIS — I110 Hypertensive heart disease with heart failure: Secondary | ICD-10-CM | POA: Diagnosis present

## 2021-02-20 DIAGNOSIS — Z20822 Contact with and (suspected) exposure to covid-19: Secondary | ICD-10-CM | POA: Diagnosis present

## 2021-02-20 DIAGNOSIS — K9189 Other postprocedural complications and disorders of digestive system: Secondary | ICD-10-CM | POA: Diagnosis not present

## 2021-02-20 DIAGNOSIS — K56609 Unspecified intestinal obstruction, unspecified as to partial versus complete obstruction: Secondary | ICD-10-CM

## 2021-02-20 DIAGNOSIS — E871 Hypo-osmolality and hyponatremia: Secondary | ICD-10-CM | POA: Diagnosis present

## 2021-02-20 DIAGNOSIS — E7800 Pure hypercholesterolemia, unspecified: Secondary | ICD-10-CM | POA: Diagnosis present

## 2021-02-20 DIAGNOSIS — K567 Ileus, unspecified: Secondary | ICD-10-CM | POA: Diagnosis not present

## 2021-02-20 DIAGNOSIS — I482 Chronic atrial fibrillation, unspecified: Secondary | ICD-10-CM | POA: Diagnosis present

## 2021-02-20 DIAGNOSIS — Z4659 Encounter for fitting and adjustment of other gastrointestinal appliance and device: Secondary | ICD-10-CM | POA: Diagnosis not present

## 2021-02-20 DIAGNOSIS — L299 Pruritus, unspecified: Secondary | ICD-10-CM | POA: Diagnosis not present

## 2021-02-20 DIAGNOSIS — I4891 Unspecified atrial fibrillation: Secondary | ICD-10-CM | POA: Diagnosis not present

## 2021-02-20 DIAGNOSIS — J9601 Acute respiratory failure with hypoxia: Secondary | ICD-10-CM | POA: Diagnosis not present

## 2021-02-20 DIAGNOSIS — I5031 Acute diastolic (congestive) heart failure: Secondary | ICD-10-CM | POA: Diagnosis present

## 2021-02-20 DIAGNOSIS — B952 Enterococcus as the cause of diseases classified elsewhere: Secondary | ICD-10-CM | POA: Diagnosis present

## 2021-02-20 DIAGNOSIS — K5720 Diverticulitis of large intestine with perforation and abscess without bleeding: Secondary | ICD-10-CM | POA: Diagnosis present

## 2021-02-20 DIAGNOSIS — F10239 Alcohol dependence with withdrawal, unspecified: Secondary | ICD-10-CM | POA: Diagnosis present

## 2021-02-20 HISTORY — DX: Sepsis, unspecified organism: A41.9

## 2021-02-20 LAB — COMPREHENSIVE METABOLIC PANEL
ALT: 23 U/L (ref 0–44)
AST: 36 U/L (ref 15–41)
Albumin: 2.7 g/dL — ABNORMAL LOW (ref 3.5–5.0)
Alkaline Phosphatase: 94 U/L (ref 38–126)
Anion gap: 15 (ref 5–15)
BUN: 38 mg/dL — ABNORMAL HIGH (ref 8–23)
CO2: 25 mmol/L (ref 22–32)
Calcium: 9.6 mg/dL (ref 8.9–10.3)
Chloride: 92 mmol/L — ABNORMAL LOW (ref 98–111)
Creatinine, Ser: 2.93 mg/dL — ABNORMAL HIGH (ref 0.61–1.24)
GFR, Estimated: 22 mL/min — ABNORMAL LOW (ref >60)
Glucose, Bld: 121 mg/dL — ABNORMAL HIGH (ref 70–99)
Potassium: 3.4 mmol/L — ABNORMAL LOW (ref 3.5–5.1)
Sodium: 132 mmol/L — ABNORMAL LOW (ref 135–145)
Total Bilirubin: 1.4 mg/dL — ABNORMAL HIGH (ref 0.3–1.2)
Total Protein: 6.7 g/dL (ref 6.5–8.1)

## 2021-02-20 LAB — APTT
aPTT: 32 seconds (ref 24–36)
aPTT: 25 seconds (ref 24–36)

## 2021-02-20 LAB — CBC
HCT: 44.1 % (ref 39.0–52.0)
Hemoglobin: 15.3 g/dL (ref 13.0–17.0)
MCH: 34.5 pg — ABNORMAL HIGH (ref 26.0–34.0)
MCHC: 34.7 g/dL (ref 30.0–36.0)
MCV: 99.5 fL (ref 80.0–100.0)
Platelets: 261 10*3/uL (ref 150–400)
RBC: 4.43 MIL/uL (ref 4.22–5.81)
RDW: 12.8 % (ref 11.5–15.5)
WBC: 14.3 10*3/uL — ABNORMAL HIGH (ref 4.0–10.5)
nRBC: 0 % (ref 0.0–0.2)

## 2021-02-20 LAB — LACTIC ACID, PLASMA: Lactic Acid, Venous: 1.5 mmol/L (ref 0.5–1.9)

## 2021-02-20 LAB — HEPARIN LEVEL (UNFRACTIONATED)
Heparin Unfractionated: 0.29 IU/mL — ABNORMAL LOW (ref 0.30–0.70)
Heparin Unfractionated: 0.3 IU/mL (ref 0.30–0.70)

## 2021-02-20 LAB — HIV ANTIBODY (ROUTINE TESTING W REFLEX): HIV Screen 4th Generation wRfx: NONREACTIVE

## 2021-02-20 MED ORDER — POTASSIUM CHLORIDE 10 MEQ/100ML IV SOLN
10.0000 meq | INTRAVENOUS | Status: AC
  Administered 2021-02-20 (×4): 10 meq via INTRAVENOUS
  Filled 2021-02-20 (×2): qty 100

## 2021-02-20 MED ORDER — THIAMINE HCL 100 MG/ML IJ SOLN
100.0000 mg | Freq: Every day | INTRAMUSCULAR | Status: DC
  Administered 2021-02-21 – 2021-03-05 (×4): 100 mg via INTRAVENOUS
  Filled 2021-02-20 (×7): qty 2

## 2021-02-20 MED ORDER — ONDANSETRON HCL 4 MG/2ML IJ SOLN
4.0000 mg | Freq: Four times a day (QID) | INTRAMUSCULAR | Status: DC | PRN
  Administered 2021-02-20 – 2021-04-05 (×23): 4 mg via INTRAVENOUS
  Filled 2021-02-20 (×24): qty 2

## 2021-02-20 MED ORDER — LORAZEPAM 1 MG PO TABS: 1.0000 mg | ORAL_TABLET | ORAL | Status: DC | PRN

## 2021-02-20 MED ORDER — HEPARIN (PORCINE) 25000 UT/250ML-% IV SOLN
1850.0000 [IU]/h | INTRAVENOUS | Status: AC
  Administered 2021-02-20: 1150 [IU]/h via INTRAVENOUS
  Administered 2021-02-22 – 2021-02-25 (×6): 1500 [IU]/h via INTRAVENOUS
  Filled 2021-02-20 (×8): qty 250

## 2021-02-20 MED ORDER — LORAZEPAM 2 MG/ML IJ SOLN
1.0000 mg | INTRAMUSCULAR | Status: DC | PRN
  Administered 2021-02-20: 2 mg via INTRAVENOUS
  Administered 2021-02-21: 4 mg via INTRAVENOUS
  Filled 2021-02-20: qty 2
  Filled 2021-02-20: qty 1

## 2021-02-20 MED ORDER — ACETAMINOPHEN 325 MG PO TABS
650.0000 mg | ORAL_TABLET | Freq: Four times a day (QID) | ORAL | Status: DC | PRN
  Administered 2021-02-25: 650 mg via ORAL
  Filled 2021-02-20: qty 2

## 2021-02-20 MED ORDER — ACETAMINOPHEN 650 MG RE SUPP: 650.0000 mg | Freq: Four times a day (QID) | RECTAL | Status: DC | PRN

## 2021-02-20 MED ORDER — METOPROLOL TARTRATE 5 MG/5ML IV SOLN
5.0000 mg | Freq: Four times a day (QID) | INTRAVENOUS | Status: DC
  Administered 2021-02-20 – 2021-02-21 (×3): 5 mg via INTRAVENOUS
  Filled 2021-02-20 (×3): qty 5

## 2021-02-20 MED ORDER — NICOTINE 21 MG/24HR TD PT24
21.0000 mg | MEDICATED_PATCH | Freq: Every day | TRANSDERMAL | Status: DC
  Administered 2021-02-20 – 2021-04-06 (×46): 21 mg via TRANSDERMAL
  Filled 2021-02-20 (×47): qty 1

## 2021-02-20 MED ORDER — PHENOL 1.4 % MT LIQD
1.0000 | OROMUCOSAL | Status: DC | PRN
  Administered 2021-02-20 – 2021-02-24 (×2): 1 via OROMUCOSAL
  Filled 2021-02-20: qty 177

## 2021-02-20 MED ORDER — MENTHOL 3 MG MT LOZG: 1.0000 | LOZENGE | OROMUCOSAL | Status: DC | PRN

## 2021-02-20 MED ORDER — LACTATED RINGERS IV SOLN: INTRAVENOUS | Status: AC

## 2021-02-20 MED ORDER — HEPARIN BOLUS VIA INFUSION
1500.0000 [IU] | Freq: Once | INTRAVENOUS | Status: AC
  Administered 2021-02-20: 1500 [IU] via INTRAVENOUS
  Filled 2021-02-20: qty 1500

## 2021-02-20 MED ORDER — ADULT MULTIVITAMIN W/MINERALS CH
1.0000 | ORAL_TABLET | Freq: Every day | ORAL | Status: DC
  Administered 2021-02-22 – 2021-02-24 (×3): 1
  Filled 2021-02-20 (×4): qty 1

## 2021-02-20 MED ORDER — METOPROLOL TARTRATE 5 MG/5ML IV SOLN
2.5000 mg | Freq: Four times a day (QID) | INTRAVENOUS | Status: DC
  Administered 2021-02-20: 2.5 mg via INTRAVENOUS
  Filled 2021-02-20: qty 5

## 2021-02-20 MED ORDER — HALOPERIDOL LACTATE 5 MG/ML IJ SOLN
2.0000 mg | Freq: Once | INTRAMUSCULAR | Status: AC
  Administered 2021-02-20: 2 mg via INTRAVENOUS
  Filled 2021-02-20: qty 1

## 2021-02-20 MED ORDER — ONDANSETRON HCL 4 MG PO TABS: 4.0000 mg | ORAL_TABLET | Freq: Four times a day (QID) | ORAL | Status: DC | PRN

## 2021-02-20 MED ORDER — THIAMINE HCL 100 MG PO TABS
100.0000 mg | ORAL_TABLET | Freq: Every day | ORAL | Status: DC
  Administered 2021-02-22 – 2021-03-04 (×8): 100 mg
  Filled 2021-02-20 (×11): qty 1

## 2021-02-20 MED ORDER — FOLIC ACID 1 MG PO TABS
1.0000 mg | ORAL_TABLET | Freq: Every day | ORAL | Status: DC
  Administered 2021-02-21 – 2021-03-05 (×12): 1 mg
  Filled 2021-02-20 (×14): qty 1

## 2021-02-20 NOTE — Progress Notes (Signed)
Pt accidentally pulled NG tube about about 3 inches, MD notified.

## 2021-02-20 NOTE — Progress Notes (Signed)
Pt accidentally further pulled out NG tube.  MD placed orders for abdominal xray.

## 2021-02-20 NOTE — ED Notes (Signed)
Pt dry heaving  

## 2021-02-20 NOTE — Progress Notes (Signed)
MD notified of heart rate AFIB 120s sustained. Orders given for IV Lopressor.  Also notified of resulted abdominal xray and orders received to advance NG tube.

## 2021-02-20 NOTE — Progress Notes (Signed)
ANTICOAGULATION CONSULT NOTE - Initial Consult  Pharmacy Consult for heparin dosing. Indication: atrial fibrillation  Allergies  Allergen Reactions   Atorvastatin Other (See Comments)    myalgias    Patient Measurements: Height: 6' (182.9 cm) Weight: 79.4 kg (175 lb 0.7 oz) IBW/kg (Calculated) : 77.6 Heparin Dosing Weight: 79.4kg  Vital Signs: Temp: 97.8 F (36.6 C) (09/04 0925) Temp Source: Oral (09/04 0925) BP: 109/71 (09/04 0925) Pulse Rate: 97 (09/04 0925)  Labs: Recent Labs    02/19/21 1653 02/19/21 1838 02/20/21 0549  HGB 16.0  --  15.3  HCT 44.8  --  44.1  PLT 254  --  261  LABPROT  --  14.6  --   INR  --  1.1  --   CREATININE 3.23*  --  2.93*    Estimated Creatinine Clearance: 25.7 mL/min (A) (by C-G formula based on SCr of 2.93 mg/dL (H)).   Medical History: Past Medical History:  Diagnosis Date   [place])    High cholesterol    "RX made groin hurt" (08/14/2016)   Hypertension     Medications:  Scheduled:  Infusions:   lactated ringers 125 mL/hr at 02/20/21 0949   piperacillin-tazobactam (ZOSYN)  IV 3.375 g (02/20/21 1109)    Assessment: 70 y/o male admitted for sepsis from contained perforated diverticulitis with active ileus. On PTA Xarelto for Afib with last dose on 9/2 at 20:00. Hgb 15.3, PLTs 261.   Goal of Therapy:  Heparin level 0.3-0.7 units/ml aPTT 66-102 seconds Monitor platelets by anticoagulation protocol: Yes   Plan:  Start heparin infusion at 1150 units/hr (in 14-16 u/kg/hr range) Check anti-Xa level in 8 hours and daily while on heparin Check aPTT until aPTT and HL start to correlate Continue to monitor CBC and signs/symptoms of bleeding Spoke to nurse, she is aware of these changes  Donald Pore, PharmD Pharmacy Resident 02/20/2021, 11:29 AM

## 2021-02-20 NOTE — Progress Notes (Signed)
MD notified that patient informed nurse that he drinks half a pint or 'a few sips' of whiskey daily.  Pt states his last drink was Friday.  New orders received for CIWAS q4

## 2021-02-20 NOTE — Progress Notes (Addendum)
PROGRESS NOTE        PATIENT DETAILS Name: Travis Conrad Age: 70 y.o. Sex: male Date of Birth: Mar 02, 1951 Admit Date: 02/19/2021 Admitting Physician Etta Quill, DO PCP:Sun, [name], MD  Brief Narrative: Patient is a 70 y.o. male A. fib, HTN, HLD, prior diverticulitis-who presented with lower abdominal pain/nausea/vomiting-on initial presentation to the ED found to have severe sepsis physiology with hypotension/AKI-felt to be due to perforated diverticulitis.  See below for further details.  Significant events: 9/3>> presented with lower abdominal pain/vomiting-sepsis from contained perforated diverticulitis.  Found to have very active ileus/PSBO from diverticulitis/bowel inflammation  Significant studies: 9/3>> CT abdomen/pelvis: Acute sigmoid diverticulitis-small extraluminal gas-fluid collection in the sigmoid mesocolon consistent with abscess.  Dilated proximal small bowel-likely ileus.  Antimicrobial therapy: Zosyn: 9/3>>  Microbiology data: 9/3>> COVID/influenza PCR: Negative 9/3>> blood culture: Negative  Procedures : None  Consults: General surgery  DVT Prophylaxis : SCDs Start: 02/20/21 0002   Subjective: Feels better-left lower abdominal pain much better than yesterday.   Assessment/Plan: Severe sepsis due to diverticulitis with contained perforation and small abscess: Improving-sepsis physiology has significantly improved compared to yesterday-continue IV antibiotics-bowel rest and other supportive care.  Hopefully we can avoid laparotomy/colectomy-General surgery following.  Ileus: Likely sequelae of diverticulitis- NG tube decompression ongoing-continue supportive care-continue bowel rest-remains NPO.  AKI: likely hemodynamically mediated-improving with supportive care-avoid nephrotoxic agents.  Hypokalemia: Replete and recheck.  PAF: Continue telemetry monitoring-given soft BP-rate control agents on hold.  We will start IV  heparin-unable to resume Xarelto given the potential to require laparotomy.  HTN: All antihypertensives on hold-as BP soft.  Reassess on 9/5.  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  Addendum: Informed by RN that patient acknowledges drinking approximately half a pint of whiskey daily-last drink this past Friday.  Starting CIWA protocol with Ativan.   Diet: Diet Order             Diet NPO time specified  Diet effective now                    Code Status: Full code  Family Communication: Spouse at bedside  Disposition Plan: Status is: Inpatient  Remains inpatient appropriate because:Inpatient level of care appropriate due to severity of illness  Dispo: The patient is from: Home              Anticipated d/c is to: Home              Patient currently is not medically stable to d/c.   Difficult to place patient No     Barriers to Discharge: Perforated diverticulitis with abscess-on IV antibiotics  Antimicrobial agents: Anti-infectives (From admission, onward)    Start     Dose/Rate Route Frequency Ordered Stop   02/20/21 0300  piperacillin-tazobactam (ZOSYN) IVPB 3.375 g        3.375 g 12.5 mL/hr over 240 Minutes Intravenous Every 8 hours 02/19/21 1907     02/19/21 1900  ceFEPIme (MAXIPIME) 2 g in sodium chloride 0.9 % 100 mL IVPB  Status:  Discontinued        2 g 200 mL/hr over 30 Minutes Intravenous  Once 02/19/21 1857 02/19/21 1906   02/19/21 1900  metroNIDAZOLE (FLAGYL) IVPB 500 mg  Status:  Discontinued  500 mg 100 mL/hr over 60 Minutes Intravenous  Once 02/19/21 1857 02/19/21 1906   02/19/21 1815  piperacillin-tazobactam (ZOSYN) IVPB 3.375 g        3.375 g 100 mL/hr over 30 Minutes Intravenous  Once 02/19/21 1809 02/19/21 1944        Time spent: 35 minutes-Greater than 50% of this time was spent in counseling, explanation of diagnosis, planning of further  management, and coordination of care.  MEDICATIONS: Scheduled Meds: Continuous Infusions:  lactated ringers 125 mL/hr at 02/20/21 0949   piperacillin-tazobactam (ZOSYN)  IV Stopped (02/20/21 0819)   PRN Meds:.acetaminophen **OR** acetaminophen, HYDROmorphone (DILAUDID) injection, menthol-cetylpyridinium, ondansetron **OR** ondansetron (ZOFRAN) IV, phenol   PHYSICAL EXAM: Vital signs: Vitals:   02/20/21 0807 02/20/21 0819 02/20/21 0858 02/20/21 0925  BP:  112/90 94/65 109/71  Pulse:  76 94 97  Resp:  (!) '21 20 15  '$ Temp: (!) 97.3 F (36.3 C)  98.2 F (36.8 C) 97.8 F (36.6 C)  TempSrc: Temporal   Oral  SpO2:  92% 92% 91%  Weight:      Height:       Filed Weights   02/19/21 1800  Weight: 79.4 kg   Body mass index is 23.74 kg/m.   Gen Exam:Alert awake-not in any distress HEENT:atraumatic, normocephalic Chest: B/L clear to auscultation anteriorly CVS:S1S2 regular Abdomen:soft mild tenderness in the lower abdomen-no peritoneal signs. Extremities:no edema Neurology: Non focal Skin: no rash  I have personally reviewed following labs and imaging studies  LABORATORY DATA: CBC: Recent Labs  Lab 02/19/21 1653 02/19/21 1838 02/20/21 0549  WBC 21.8*  --  14.3*  NEUTROABS  --  18.3*  --   HGB 16.0  --  15.3  HCT 44.8  --  44.1  MCV 96.1  --  99.5  PLT 254  --  0000000    Basic Metabolic Panel: Recent Labs  Lab 02/19/21 1653 02/19/21 1838 02/20/21 0549  NA 131*  --  132*  K 3.2*  --  3.4*  CL 88*  --  92*  CO2 25  --  25  GLUCOSE 137*  --  121*  BUN 38*  --  38*  CREATININE 3.23*  --  2.93*  CALCIUM 11.0*  --  9.6  MG  --  2.0  --   PHOS  --  4.8*  --     GFR: Estimated Creatinine Clearance: 25.7 mL/min (A) (by C-G formula based on SCr of 2.93 mg/dL (H)).  Liver Function Tests: Recent Labs  Lab 02/19/21 1653 02/20/21 0549  AST 18 36  ALT 11 23  ALKPHOS 85 94  BILITOT 1.1 1.4*  PROT 7.6 6.7  ALBUMIN 3.9 2.7*   Recent Labs  Lab 02/19/21 1653   LIPASE 22   No results for input(s): AMMONIA in the last 168 hours.  Coagulation Profile: Recent Labs  Lab 02/19/21 1838  INR 1.1    Cardiac Enzymes: No results for input(s): CKTOTAL, CKMB, CKMBINDEX, TROPONINI in the last 168 hours.  BNP (last 3 results) No results for input(s): PROBNP in the last 8760 hours.  Lipid Profile: No results for input(s): CHOL, HDL, LDLCALC, TRIG, CHOLHDL, LDLDIRECT in the last 72 hours.  Thyroid Function Tests: No results for input(s): TSH, T4TOTAL, FREET4, T3FREE, THYROIDAB in the last 72 hours.  Anemia Panel: No results for input(s): VITAMINB12, FOLATE, FERRITIN, TIBC, IRON, RETICCTPCT in the last 72 hours.  Urine analysis:    Component Value Date/Time   COLORURINE BROWN (A) 02/19/2021 1645  APPEARANCEUR CLOUDY (A) 02/19/2021 1645   LABSPEC 1.026 02/19/2021 1645   PHURINE 5.5 02/19/2021 1645   GLUCOSEU NEGATIVE 02/19/2021 1645   HGBUR LARGE (A) 02/19/2021 1645   BILIRUBINUR SMALL (A) 02/19/2021 1645   KETONESUR NEGATIVE 02/19/2021 1645   PROTEINUR >300 (A) 02/19/2021 1645   UROBILINOGEN 1.0 12/29/2013 1735   NITRITE NEGATIVE 02/19/2021 1645   LEUKOCYTESUR NEGATIVE 02/19/2021 1645    Sepsis Labs: Lactic Acid, Venous    Component Value Date/Time   LATICACIDVEN 1.3 02/19/2021 1838    MICROBIOLOGY: Recent Results (from the past 240 hour(s))  Culture, blood (routine x 2)     Status: None (Preliminary result)   Collection Time: 02/19/21  6:07 PM   Specimen: BLOOD  Result Value Ref Range Status   Specimen Description   Final    BLOOD BLOOD LEFT FOREARM Performed at [facility], [address] [place], [place] [address]    Special Requests   Final    BOTTLES DRAWN AEROBIC AND ANAEROBIC Blood Culture adequate volume Performed at [facility], [address], [place], [place] [address]    Culture   Final    NO GROWTH < 12 HOURS Performed at [facility], [place] [address],  [place], [place] [address]    Report Status PENDING  Incomplete  Culture, blood (routine x 2)     Status: None (Preliminary result)   Collection Time: 02/19/21  6:12 PM   Specimen: BLOOD  Result Value Ref Range Status   Specimen Description   Final    BLOOD BLOOD RIGHT FOREARM Performed at [facility], [address], [place], [place] [address]    Special Requests   Final    BOTTLES DRAWN AEROBIC AND ANAEROBIC Blood Culture adequate volume Performed at [facility], [address], [place], [place] [address]    Culture   Final    NO GROWTH < 12 HOURS Performed at [facility], [place] [address], [place],  [address]    Report Status PENDING  Incomplete  Resp Panel by RT-PCR (Flu A&B, Covid)     Status: None   Collection Time: 02/19/21  6:38 PM   Specimen: Nasopharyngeal(NP) swabs in vial transport medium  Result Value Ref Range Status   SARS Coronavirus 2 by RT PCR NEGATIVE NEGATIVE Final    Comment: (NOTE) SARS-CoV-2 target nucleic acids are NOT DETECTED.  The SARS-CoV-2 RNA is generally detectable in upper respiratory specimens during the acute phase of infection. The lowest concentration of SARS-CoV-2 viral copies this assay can detect is 138 copies/mL. A negative result does not preclude SARS-Cov-2 infection and should not be used as the sole basis for treatment or other patient management decisions. A negative result may occur with  improper specimen collection/handling, submission of specimen other than nasopharyngeal swab, presence of viral mutation(s) within the areas targeted by this assay, and inadequate number of viral copies(<138 copies/mL). A negative result must be combined with clinical observations, patient history, and epidemiological information. The expected result is Negative.  Fact Sheet for Patients:  EntrepreneurPulse.com.au  Fact Sheet for Healthcare Providers:   IncredibleEmployment.be  This test is no t yet approved or cleared by the [place] FDA and  has been authorized for detection and/or diagnosis of SARS-CoV-2 by FDA under an Emergency Use Authorization (EUA). This EUA will remain  in effect (meaning this test can be used) for the duration of the COVID-19 declaration under Section 564(b)(1) of the Act, 21 U.S.C.section 360bbb-3(b)(1),  unless the authorization is terminated  or revoked sooner.       Influenza A by PCR NEGATIVE NEGATIVE Final   Influenza B by PCR NEGATIVE NEGATIVE Final    Comment: (NOTE) The Xpert Xpress SARS-CoV-2/FLU/RSV plus assay is intended as an aid in the diagnosis of influenza from Nasopharyngeal swab specimens and should not be used as a sole basis for treatment. Nasal washings and aspirates are unacceptable for Xpert Xpress SARS-CoV-2/FLU/RSV testing.  Fact Sheet for Patients: EntrepreneurPulse.com.au  Fact Sheet for Healthcare Providers: IncredibleEmployment.be  This test is not yet approved or cleared by the [place] FDA and has been authorized for detection and/or diagnosis of SARS-CoV-2 by FDA under an Emergency Use Authorization (EUA). This EUA will remain in effect (meaning this test can be used) for the duration of the COVID-19 declaration under Section 564(b)(1) of the Act, 21 U.S.C. section 360bbb-3(b)(1), unless the authorization is terminated or revoked.  Performed at [facility], [address], [place], [place] [address]     RADIOLOGY STUDIES/RESULTS: CT Abdomen Pelvis Wo Contrast  Result Date: 02/19/2021 CLINICAL DATA:  Constipation, abdominal pain, not urinating much, decreased renal function on labs today, suspected intra-abdominal infection/abscess, history atrial fibrillation, hypertension, smoker EXAM: CT ABDOMEN AND PELVIS WITHOUT CONTRAST TECHNIQUE: Multidetector CT imaging of the abdomen and  pelvis was performed following the standard protocol without IV contrast. Sagittal and coronal MPR images reconstructed from axial data set. No oral contrast administered. 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 Adrenal glands, kidneys, and ureters otherwise normal appearance. Bladder decompressed. Stomach/Bowel: Normal appendix. Scattered colonic diverticulosis. Wall thickening at sigmoid colon with infiltration of pericolic fat consistent with acute diverticulitis. Small extraluminal gas and fluid collection identified consistent with perforation and abscess, collection measuring 3.5 x 1.5 x 2.7 cm. Small duodenal diverticulum. Dilated proximal and decompressed distal small bowel loops, question ileus, no discrete transition zone identified. Stomach unremarkable. Vascular/Lymphatic: Atherosclerotic calcifications aorta and iliac arteries without aneurysm. Scattered pelvic phleboliths. Reproductive: Mild prostatic enlargement. Seminal vesicles unremarkable. Other: No free air or free fluid.  No hernia. Musculoskeletal: Bones demineralized. IMPRESSION: Acute sigmoid diverticulitis with a small extraluminal gas and fluid collection in the sigmoid mesocolon 3.5 x 1.5 x 2.7 cm consistent with perforation and abscess. Dilated proximal and decompressed distal small bowel loops, question ileus. Mild prostatic enlargement. 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 Aortic Atherosclerosis (ICD10-I70.0). Findings called to Dr.  Johnney Killian on 02/19/2021 at 1847 hours. Electronically Signed   By: Lavonia Dana M.D.   On: 02/19/2021 18:48   DG Chest Port 1 View  Result Date: 02/19/2021 CLINICAL DATA:  Abdominal pain and wheezing EXAM: PORTABLE CHEST 1 VIEW COMPARISON:  Portable exam 1824 hours compared to 06/28/2020 FINDINGS:  Normal heart size, mediastinal contours, and pulmonary vascularity. Atherosclerotic calcification aorta. Lungs clear. No pulmonary infiltrate, pleural effusion, or pneumothorax. Osseous structures unremarkable. IMPRESSION: No acute abnormalities. Aortic Atherosclerosis (ICD10-I70.0). Electronically Signed   By: Lavonia Dana M.D.   On: 02/19/2021 18:49   DG Abd Portable 1 View  Result Date: 02/20/2021 CLINICAL DATA:  Nasogastric tube placement EXAM: PORTABLE ABDOMEN - 1 VIEW COMPARISON:  Abdominal CT from yesterday FINDINGS: The enteric tube tip reaches the stomach with side port at the GE junction. Dilated small bowel in the upper abdomen which is known from prior CT. IMPRESSION: Enteric tube with tip at the stomach  and side port at the GE junction. Electronically Signed   By: Monte Fantasia M.D.   On: 02/20/2021 06:28     LOS: 0 days   Oren Binet, MD  [facility]    To contact the attending provider between 7A-7P or the covering provider during after hours 7P-7A, please log into the web site [email or web address] and access using universal [place] password for that web site. If you do not have the password, please call the hospital operator.  02/20/2021, 10:52 AM

## 2021-02-20 NOTE — Progress Notes (Signed)
RN advanced NG tube to 64 cm as per orders from MD.  RN also notified MD no changes in elevated heart rate AFIB 120s-140s now.  New orders received for prn medication. MD also gave RN verbal orders to recheck placement of NG tube with another abdominal xray.

## 2021-02-20 NOTE — Progress Notes (Signed)
Patient anxious  at this time . Ativan given. Patient confused trying to get out of bed. MD made aware will continue to monitor.

## 2021-02-20 NOTE — Progress Notes (Signed)
Subjective: Patient feels much better this morning.  Pain has essentially resolved.  Has NGT in place that after adjusting has put out over 500cc in about 3-4 minutes and still working.  Had had about 8 loose BMs this am per him as well.  Hate NGT  ROS: See above, otherwise other systems negative  Objective: Vital signs in last 24 hours: Temp:  [97.3 F (36.3 C)-98 F (36.7 C)] 97.3 F (36.3 C) (09/04 0807) Pulse Rate:  [27-112] 80 (09/04 0745) Resp:  [13-30] 17 (09/04 0745) BP: (74-110)/(51-77) 110/65 (09/04 0745) SpO2:  [88 %-98 %] 91 % (09/04 0745) Weight:  [79.4 kg] 79.4 kg (09/03 1800)    Intake/Output from previous day: 09/03 0701 - 09/04 0700 In: 4028.8 [IV Piggyback:4028.8] Out: -  Intake/Output this shift: No intake/output data recorded.  PE: Gen: NAD Heart: irregularly irregular Lungs: CTAB Abd: soft, NT, mild distention, NGT in place with about 500cc of bilious output   Lab Results:  Recent Labs    02/19/21 1653 02/20/21 0549  WBC 21.8* 14.3*  HGB 16.0 15.3  HCT 44.8 44.1  PLT 254 261   BMET Recent Labs    02/19/21 1653 02/20/21 0549  NA 131* 132*  K 3.2* 3.4*  CL 88* 92*  CO2 25 25  GLUCOSE 137* 121*  BUN 38* 38*  CREATININE 3.23* 2.93*  CALCIUM 11.0* 9.6   PT/INR Recent Labs    02/19/21 1838  LABPROT 14.6  INR 1.1   CMP     Component Value Date/Time   NA 132 (L) 02/20/2021 0549   NA 134 09/30/2019 0959   NA 142 03/03/2016 0921   K 3.4 (L) 02/20/2021 0549   K 4.3 03/03/2016 0921   CL 92 (L) 02/20/2021 0549   CO2 25 02/20/2021 0549   CO2 25 03/03/2016 0921   GLUCOSE 121 (H) 02/20/2021 0549   GLUCOSE 89 03/03/2016 0921   BUN 38 (H) 02/20/2021 0549   BUN 11 09/30/2019 0959   BUN 13.6 03/03/2016 0921   CREATININE 2.93 (H) 02/20/2021 0549   CREATININE 1.2 03/03/2016 0921   CALCIUM 9.6 02/20/2021 0549   CALCIUM 9.6 03/03/2016 0921   PROT 6.7 02/20/2021 0549   PROT 7.3 03/03/2016 0921   ALBUMIN 2.7 (L) 02/20/2021 0549    ALBUMIN 3.8 03/03/2016 0921   AST 36 02/20/2021 0549   AST 36 (H) 03/03/2016 0921   ALT 23 02/20/2021 0549   ALT 31 03/03/2016 0921   ALKPHOS 94 02/20/2021 0549   ALKPHOS 116 03/03/2016 0921   BILITOT 1.4 (H) 02/20/2021 0549   BILITOT 0.71 03/03/2016 0921   GFRNONAA 22 (L) 02/20/2021 0549   GFRNONAA 67 07/25/2013 1712   GFRAA >60 10/03/2019 0930   GFRAA 77 07/25/2013 1712   Lipase     Component Value Date/Time   LIPASE 22 02/19/2021 1653       Studies/Results: CT Abdomen Pelvis Wo Contrast  Result Date: 02/19/2021 CLINICAL DATA:  Constipation, abdominal pain, not urinating much, decreased renal function on labs today, suspected intra-abdominal infection/abscess, history atrial fibrillation, hypertension, smoker EXAM: CT ABDOMEN AND PELVIS WITHOUT CONTRAST TECHNIQUE: Multidetector CT imaging of the abdomen and pelvis was performed following the standard protocol without IV contrast. Sagittal and coronal MPR images reconstructed from axial data set. No oral contrast administered. COMPARISON:  11/19/2016 FINDINGS: Lower chest: Lung bases clear Hepatobiliary: Gallbladder and liver normal appearance Pancreas: Normal appearance Spleen: Normal appearance Adrenals/Urinary Tract: Tiny LEFT adrenal myelolipoma 9 mm  diameter. Adrenal glands, kidneys, and ureters otherwise normal appearance. Bladder decompressed. Stomach/Bowel: Normal appendix. Scattered colonic diverticulosis. Wall thickening at sigmoid colon with infiltration of pericolic fat consistent with acute diverticulitis. Small extraluminal gas and fluid collection identified consistent with perforation and abscess, collection measuring 3.5 x 1.5 x 2.7 cm. Small duodenal diverticulum. Dilated proximal and decompressed distal small bowel loops, question ileus, no discrete transition zone identified. Stomach unremarkable. Vascular/Lymphatic: Atherosclerotic calcifications aorta and iliac arteries without aneurysm. Scattered pelvic  phleboliths. Reproductive: Mild prostatic enlargement. Seminal vesicles unremarkable. Other: No free air or free fluid.  No hernia. Musculoskeletal: Bones demineralized. IMPRESSION: Acute sigmoid diverticulitis with a small extraluminal gas and fluid collection in the sigmoid mesocolon 3.5 x 1.5 x 2.7 cm consistent with perforation and abscess. Dilated proximal and decompressed distal small bowel loops, question ileus. Mild prostatic enlargement. 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 Aortic Atherosclerosis (ICD10-I70.0). Findings called to Dr.  Johnney Killian on 02/19/2021 at 1847 hours. Electronically Signed   By: Lavonia Dana M.D.   On: 02/19/2021 18:48   DG Chest Port 1 View  Result Date: 02/19/2021 CLINICAL DATA:  Abdominal pain and wheezing EXAM: PORTABLE CHEST 1 VIEW COMPARISON:  Portable exam 1824 hours compared to 06/28/2020 FINDINGS: Normal heart size, mediastinal contours, and pulmonary vascularity. Atherosclerotic calcification aorta. Lungs clear. No pulmonary infiltrate, pleural effusion, or pneumothorax. Osseous structures unremarkable. IMPRESSION: No acute abnormalities. Aortic Atherosclerosis (ICD10-I70.0). Electronically Signed   By: Lavonia Dana M.D.   On: 02/19/2021 18:49   DG Abd Portable 1 View  Result Date: 02/20/2021 CLINICAL DATA:  Nasogastric tube placement EXAM: PORTABLE ABDOMEN - 1 VIEW COMPARISON:  Abdominal CT from yesterday FINDINGS: The enteric tube tip reaches the stomach with side port at the GE junction. Dilated small bowel in the upper abdomen which is known from prior CT. IMPRESSION: Enteric tube with tip at the stomach and side port at the GE junction. Electronically Signed   By: Monte Fantasia M.D.   On: 02/20/2021 06:28    Anti-infectives: Anti-infectives (From admission, onward)    Start     Dose/Rate Route Frequency Ordered Stop   02/20/21 0300  piperacillin-tazobactam (ZOSYN) IVPB 3.375 g        3.375 g 12.5 mL/hr over 240 Minutes Intravenous Every 8 hours  02/19/21 1907     02/19/21 1900  ceFEPIme (MAXIPIME) 2 g in sodium chloride 0.9 % 100 mL IVPB  Status:  Discontinued        2 g 200 mL/hr over 30 Minutes Intravenous  Once 02/19/21 1857 02/19/21 1906   02/19/21 1900  metroNIDAZOLE (FLAGYL) IVPB 500 mg  Status:  Discontinued        500 mg 100 mL/hr over 60 Minutes Intravenous  Once 02/19/21 1857 02/19/21 1906   02/19/21 1815  piperacillin-tazobactam (ZOSYN) IVPB 3.375 g        3.375 g 100 mL/hr over 30 Minutes Intravenous  Once 02/19/21 1809 02/19/21 1944        Assessment/Plan Acute sigmoid diverticulitis with contained perforation and abscess -not drainable -cont zosyn and abx therapy -cont NGT for gastric decompression -WBC improved to 14K from 21K, labs in am -spray and lozenges for his throat  -pain essentially resolved this morning.  Will continue to closely monitor given ileus/PSBO type picture though secondary to inflammatory change -hopes for improvement without surgical intervention -will need c-scope in about 6 weeks after DC  FEN - NPO/NGT/IVFs, K replacement today VTE - ok for chemical prophylaxis from surgery standpoint ID -  zosyn  A fib AKI Hypokalemia HTN   LOS: 0 days    Henreitta Cea , [facility] 02/20/2021, 8:08 AM Please see Amion for pager number during day hours 7:00am-4:30pm or 7:00am -11:30am on weekends

## 2021-02-20 NOTE — Progress Notes (Signed)
ANTICOAGULATION CONSULT NOTE - Initial Consult  Pharmacy Consult for heparin dosing. Indication: atrial fibrillation  Allergies  Allergen Reactions   Atorvastatin Other (See Comments)    myalgias    Patient Measurements: Height: 6' (182.9 cm) Weight: 79.4 kg (175 lb 0.7 oz) IBW/kg (Calculated) : 77.6 Heparin Dosing Weight: 79.4kg  Vital Signs: Temp: 98 F (36.7 C) (09/04 1618) Temp Source: Oral (09/04 1618) BP: 113/79 (09/04 1800) Pulse Rate: 87 (09/04 1618)  Labs: Recent Labs    02/19/21 1653 02/19/21 1838 02/20/21 0549 02/20/21 1137 02/20/21 1910  HGB 16.0  --  15.3  --   --   HCT 44.8  --  44.1  --   --   PLT 254  --  261  --   --   APTT  --   --   --  32 25  LABPROT  --  14.6  --   --   --   INR  --  1.1  --   --   --   HEPARINUNFRC  --   --   --  0.29* 0.30  CREATININE 3.23*  --  2.93*  --   --      Estimated Creatinine Clearance: 25.7 mL/min (A) (by C-G formula based on SCr of 2.93 mg/dL (H)).   Medical History: Past Medical History:  Diagnosis Date   Atrial fibrillation ([place])    High cholesterol    "RX made groin hurt" (08/14/2016)   Hypertension     Medications:  Scheduled:   folic acid  1 mg Per Tube Daily   heparin  1,500 Units Intravenous Once   metoprolol tartrate  5 mg Intravenous Q6H   multivitamin with minerals  1 tablet Per Tube Daily   nicotine  21 mg Transdermal Daily   thiamine  100 mg Per Tube Daily   Or   thiamine  100 mg Intravenous Daily   Infusions:   heparin 1,150 Units/hr (02/20/21 1746)   lactated ringers 125 mL/hr at 02/20/21 1802   piperacillin-tazobactam (ZOSYN)  IV 3.375 g (02/20/21 2012)    Assessment: 70 y/o male admitted for sepsis from contained perforated diverticulitis with active ileus. On PTA Xarelto for Afib with last dose on 9/2 at 20:00. Hgb 15.3, PLTs 261.   HL 0.3, aPTT subtherapeutic at 25. No s/sx of bleeding per RN or issues with IV site.  Goal of Therapy:  Heparin level 0.3-0.7 units/ml aPTT  66-102 seconds Monitor platelets by anticoagulation protocol: Yes   Plan: Bolus 1500 units x1 Increase heparin infusion at 1350 units/hr Check anti-Xa level in 8 hours and daily while on heparin Check aPTT until aPTT and HL start to correlate Continue to monitor CBC and signs/symptoms of bleeding  Joetta Manners, PharmD, [facility] Clinical Pharmacist ED RPh Phone: [place]: [phone]

## 2021-02-20 NOTE — H&P (Signed)
History and Physical    Travis Conrad [id number] DOB: May 12, 1951 DOA: 02/19/2021  PCP: [name], MD  Patient coming from: Home  I have personally briefly reviewed patient's old medical records in Travis Conrad  Chief Complaint: N/V, abd pain  HPI: Travis Conrad is a 70 y.o. male with medical history significant of A.Fib, HLD, HTN, prior diverticulitis.  Pt presents to ED with c/o several day h/o worsening abd pain, nausea, vomiting.  Symptoms persistent, worsening.  Poor PO intake for past 2-3 days.  Nothing makes symptoms better or worse.  Symptoms severe.  ROS positive for decreased urination.  No documented fever, does have chills.   ED Course: Pt initially in with severe sepsis, BPs in the 70s, AKI with creat of 3.2 up from 1.3.  WBC 21k  CT AP reveals: 1) perforated diverticulitis with abscess 2) reactive ileus vs PSBO  Started on zosyn  Given 30cc/kg bolus, then given another 1.5L when BP persistently low.  Levophed started, pt transferred to [facility], levophed stopped.   Review of Systems: As per HPI, otherwise all review of systems negative.  Past Medical History:  Diagnosis Date   Atrial fibrillation ([place])    High cholesterol    "RX made groin hurt" (08/14/2016)   Hypertension     Past Surgical History:  Procedure Laterality Date   ANKLE FRACTURE SURGERY Left ~ 2012   "put rod and pins in"   ATRIAL FIBRILLATION ABLATION N/A 10/03/2019   Procedure: ATRIAL FIBRILLATION ABLATION;  Surgeon: Travis Haw, MD;  Location: [facility];  Service: Cardiovascular;  Laterality: N/A;   COLONOSCOPY W/ BIOPSIES AND POLYPECTOMY  ~ 2000   FRACTURE SURGERY     ORIF FIBULA FRACTURE  06/24/2012   Procedure: OPEN REDUCTION INTERNAL FIXATION (ORIF) FIBULA FRACTURE;  Surgeon: Travis Box, MD;  Location: [facility];  Service: Orthopedics;  Laterality: Left;  ORIF LEFT FIBULA,  POSSIBLE REPAIR OF SYNDESMOSIS    TEE WITHOUT CARDIOVERSION N/A 10/03/2019    Procedure: TRANSESOPHAGEAL ECHOCARDIOGRAM (TEE);  Surgeon: Travis Haw, MD;  Location: [facility] [facility];  Service: Cardiovascular;  Laterality: N/A;     reports that he has been smoking cigarettes. He has a 50.00 pack-year smoking history. He has never used smokeless tobacco. He reports that he does not currently use alcohol. He reports current drug use. Drugs: Cocaine and Marijuana.  Allergies  Allergen Reactions   Atorvastatin Other (See Comments)    myalgias    Family History  Problem Relation Age of Onset   Other Brother      Prior to Admission medications   Medication Sig Start Date End Date Taking? Authorizing Provider  calcium carbonate (TUMS - DOSED IN MG ELEMENTAL CALCIUM) 500 MG chewable tablet Chew 1 tablet by mouth 3 (three) times daily as needed for indigestion or heartburn.   Yes [provider]  diltiazem (DILT-XR) 240 MG 24 hr capsule Take 1 capsule by mouth daily with supper. Patient overdue for an appointment with Dr [name] and needs to call and schedule for further refills 1st attempt Patient taking differently: Take 240 mg by mouth every evening. 02/08/21  Yes Fay Records, MD  diphenhydrAMINE HCl, Sleep, (ZZZQUIL PO) Take 2 capsules by mouth at bedtime as needed (sleep).   Yes [provider]  linaCLOtide (LINZESS PO) Take 1 capsule by mouth daily.   Yes [provider]  losartan-hydrochlorothiazide (HYZAAR) 100-25 MG tablet Take 1 tablet by mouth daily. Please schedule overdue  appointment for future refills. 3rd and final attempt. Patient taking differently: Take 0.5 tablets by mouth daily. 10/26/20  Yes Fay Records, MD  rosuvastatin (CRESTOR) 20 MG tablet TAKE 1 TABLET(20 MG) BY MOUTH DAILY Patient taking differently: Take 20 mg by mouth daily. 04/28/20  Yes Fay Records, MD  sotalol (BETAPACE) 120 MG tablet TAKE 1 TABLET(120 MG) BY MOUTH EVERY 12 HOURS Patient taking differently: Take 120 mg by mouth 2 (two) times daily.  08/11/20  Yes Fay Records, MD  XARELTO 20 MG TABS tablet TAKE 1 TABLET BY MOUTH EVERY DAY WITH DINNER Patient taking differently: Take 20 mg by mouth at bedtime. 09/10/20  Yes Camnitz, Travis Hassell Done, MD  COVID-19 mRNA vaccine, Pfizer, 30 MCG/0.3ML injection INJECT AS DIRECTED 04/05/20 04/05/21  Carlyle Basques, MD  HYDROcodone-acetaminophen [facility]) 5-325 MG tablet Take 1-2 tablets by mouth every 6 (six) hours as needed. Patient not taking: No sig reported 06/28/20   Travis Conrad, [place]    Physical Exam: Vitals:   02/19/21 2200 02/19/21 2215 02/19/21 2230 02/19/21 2245  BP: (!) 84/65 (!) 86/68 (!) 84/66 (!) 87/73  Pulse: (!) 57 (!) 38 (!) 39 (!) 27  Resp: '16 20 19 18  '$ Temp:      SpO2: (!) 89% 94% 91% 90%  Weight:      Height:        Constitutional: Ill appearing, having emesis Eyes: PERRL, lids and conjunctivae normal ENMT: Mucous membranes are moist. Posterior pharynx clear of any exudate or lesions.Normal dentition.  Neck: normal, supple, no masses, no thyromegaly Respiratory: clear to auscultation bilaterally, no wheezing, no crackles. Normal respiratory effort. No accessory muscle use.  Cardiovascular: IRR, IRR Abdomen: Mild TTP Musculoskeletal: no clubbing / cyanosis. No joint deformity upper and lower extremities. Good ROM, no contractures. Normal muscle tone.  Skin: no rashes, lesions, ulcers. No induration Neurologic: CN 2-12 grossly intact. Sensation intact, DTR normal. Strength 5/5 in all 4.  Psychiatric: Normal judgment and insight. Alert and oriented x 3. Normal mood.    Labs on Admission: I have personally reviewed following labs and imaging studies  CBC: Recent Labs  Lab 02/19/21 1653 02/19/21 1838  WBC 21.8*  --   NEUTROABS  --  18.3*  HGB 16.0  --   HCT 44.8  --   MCV 96.1  --   PLT 254  --    Basic Metabolic Panel: Recent Labs  Lab 02/19/21 1653 02/19/21 1838  NA 131*  --   K 3.2*  --   CL 88*  --   CO2 25  --   GLUCOSE 137*  --   BUN 38*  --    CREATININE 3.23*  --   CALCIUM 11.0*  --   MG  --  2.0  PHOS  --  4.8*   GFR: Estimated Creatinine Clearance: 23.4 mL/min (A) (by C-G formula based on SCr of 3.23 mg/dL (H)). Liver Function Tests: Recent Labs  Lab 02/19/21 1653  AST 18  ALT 11  ALKPHOS 85  BILITOT 1.1  PROT 7.6  ALBUMIN 3.9   Recent Labs  Lab 02/19/21 1653  LIPASE 22   No results for input(s): AMMONIA in the last 168 hours. Coagulation Profile: Recent Labs  Lab 02/19/21 1838  INR 1.1   Cardiac Enzymes: No results for input(s): CKTOTAL, CKMB, CKMBINDEX, TROPONINI in the last 168 hours. BNP (last 3 results) No results for input(s): PROBNP in the last 8760 hours. HbA1C: No results for input(s): HGBA1C in  the last 72 hours. CBG: No results for input(s): GLUCAP in the last 168 hours. Lipid Profile: No results for input(s): CHOL, HDL, LDLCALC, TRIG, CHOLHDL, LDLDIRECT in the last 72 hours. Thyroid Function Tests: No results for input(s): TSH, T4TOTAL, FREET4, T3FREE, THYROIDAB in the last 72 hours. Anemia Panel: No results for input(s): VITAMINB12, FOLATE, FERRITIN, TIBC, IRON, RETICCTPCT in the last 72 hours. Urine analysis:    Component Value Date/Time   COLORURINE BROWN (A) 02/19/2021 1645   APPEARANCEUR CLOUDY (A) 02/19/2021 1645   LABSPEC 1.026 02/19/2021 1645   PHURINE 5.5 02/19/2021 1645   GLUCOSEU NEGATIVE 02/19/2021 1645   HGBUR LARGE (A) 02/19/2021 1645   BILIRUBINUR SMALL (A) 02/19/2021 1645   KETONESUR NEGATIVE 02/19/2021 1645   PROTEINUR >300 (A) 02/19/2021 1645   UROBILINOGEN 1.0 12/29/2013 1735   NITRITE NEGATIVE 02/19/2021 1645   LEUKOCYTESUR NEGATIVE 02/19/2021 1645    Radiological Exams on Admission: CT Abdomen Pelvis Wo Contrast  Result Date: 02/19/2021 CLINICAL DATA:  Constipation, abdominal pain, not urinating much, decreased renal function on labs today, suspected intra-abdominal infection/abscess, history atrial fibrillation, hypertension, smoker EXAM: CT ABDOMEN AND  PELVIS WITHOUT CONTRAST TECHNIQUE: Multidetector CT imaging of the abdomen and pelvis was performed following the standard protocol without IV contrast. Sagittal and coronal MPR images reconstructed from axial data set. No oral contrast administered. 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 Adrenal glands, kidneys, and ureters otherwise normal appearance. Bladder decompressed. Stomach/Bowel: Normal appendix. Scattered colonic diverticulosis. Wall thickening at sigmoid colon with infiltration of pericolic fat consistent with acute diverticulitis. Small extraluminal gas and fluid collection identified consistent with perforation and abscess, collection measuring 3.5 x 1.5 x 2.7 cm. Small duodenal diverticulum. Dilated proximal and decompressed distal small bowel loops, question ileus, no discrete transition zone identified. Stomach unremarkable. Vascular/Lymphatic: Atherosclerotic calcifications aorta and iliac arteries without aneurysm. Scattered pelvic phleboliths. Reproductive: Mild prostatic enlargement. Seminal vesicles unremarkable. Other: No free air or free fluid.  No hernia. Musculoskeletal: Bones demineralized. IMPRESSION: Acute sigmoid diverticulitis with a small extraluminal gas and fluid collection in the sigmoid mesocolon 3.5 x 1.5 x 2.7 cm consistent with perforation and abscess. Dilated proximal and decompressed distal small bowel loops, question ileus. Mild prostatic enlargement. 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 Aortic Atherosclerosis (ICD10-I70.0). Findings called to Dr.  Johnney Killian on 02/19/2021 at 1847 hours. Electronically Signed   By: Lavonia Dana M.D.   On: 02/19/2021 18:48   DG Chest Port 1 View  Result Date: 02/19/2021 CLINICAL DATA:  Abdominal pain and wheezing EXAM: PORTABLE CHEST 1  VIEW COMPARISON:  Portable exam 1824 hours compared to 06/28/2020 FINDINGS: Normal heart size, mediastinal contours, and pulmonary vascularity. Atherosclerotic calcification aorta. Lungs clear. No pulmonary infiltrate, pleural effusion, or pneumothorax. Osseous structures unremarkable. IMPRESSION: No acute abnormalities. Aortic Atherosclerosis (ICD10-I70.0). Electronically Signed   By: Lavonia Dana M.D.   On: 02/19/2021 18:49    EKG: Independently reviewed.  Assessment/Plan Principal Problem:   Diverticulitis of large intestine with perforation and abscess without bleeding Active Problems:   Atrial fibrillation, chronic (HCC)   Essential hypertension   Diverticulitis with obstruction (HCC)   AKI (acute kidney injury) ([place])   Severe sepsis with acute organ dysfunction ([place])    Diverticulitis with perforation, abscess, and obstruction vs ileus - See gen surg consult NPO Dilaudid PRN pain if needed (hopefully BP can tolerate). Got zofran for  nausea, had vomiting despite this Trying phenergan, if he has vomiting despite phenergan then Travis need NGT placed for the ileus / obstruction. Let RN know and went ahead and ordered this as I strongly suspect he Travis end up needing it. Severe sepsis with hypotension and AKI - Secondary to #1 above IVF: 2382cc (19m/kg) + 1.5 L + LR at 125 Narrowly has avoided needing levophed for the moment Empiric zosyn Tele monitor Strict intake and output Repeat CMP + CBC in AM BCx pending A.Fib - Tele monitor Holding home BB and CCB due to hypotension May need to use short acting when HR increases Holding Xarelto in case emergent surgery required HTN - Holding all home BP meds due to hypotension from sepsis Hypokalemia - replace K  DVT prophylaxis: SCDs Code Status: Full Family Communication: Wife at bedside Disposition Plan: Home after sepsis resolved, diverticulitis improved, taking POs Consults called: Gen surg Admission status: Admit to  inpatient  Severity of Illness: The appropriate patient status for this patient is INPATIENT. Inpatient status is judged to be reasonable and necessary in order to provide the required intensity of service to ensure the patient's safety. The patient's presenting symptoms, physical exam findings, and initial radiographic and laboratory data in the context of their chronic comorbidities is felt to place them at high risk for further clinical deterioration. Furthermore, it is not anticipated that the patient Travis be medically stable for discharge from the hospital within 2 midnights of admission. The following factors support the patient status of inpatient.   IP status for diverticulitis complicated by perforation, abscess, obstruction vs ileus.  Also causing severe sepsis with hypotension which in turn is causing AKI.   * I certify that at the point of admission it is my clinical judgment that the patient Travis require inpatient hospital care spanning beyond 2 midnights from the point of admission due to high intensity of service, high risk for further deterioration and high frequency of surveillance required.*   Yamato Kopf M. DO [facility]  How to contact the [facility] HospitalAttending or Consulting provider [place] covering provider during after hours [place] for this patient?  Check the care team in [facility] Hospitaland look for a) attending/consulting TR[facility] provider listed and b) the TNorth[facility] Medical Centerteam listed Log into [email or web address]  Amion Physician Scheduling and messaging for groups and whole hospitals  On call and physician scheduling software for group practices, residents, hospitalists and other medical providers for call, clinic, rotation and shift schedules. OnCall [employer] is a hospital-wide system for scheduling doctors and paging doctors on call. EasyPlot is for scientific plotting and data analysis.  [email or web address]  and use [place]'s universal password to access. If you do not have the password, please  contact the hospital operator.  Locate the TBakers[facility] Hospital- 34Th Streetprovider you are looking for under [facility] and page to a number that you can be directly reached. If you still have difficulty reaching the provider, please page the [facility](Director on Call) for the Hospitalists listed on amion for assistance.  02/20/2021, 12:00 AM

## 2021-02-20 NOTE — ED Notes (Signed)
Pt belongings taken home by wife.

## 2021-02-21 ENCOUNTER — Inpatient Hospital Stay (HOSPITAL_COMMUNITY): Payer: Medicare Other

## 2021-02-21 DIAGNOSIS — A419 Sepsis, unspecified organism: Principal | ICD-10-CM

## 2021-02-21 DIAGNOSIS — K5720 Diverticulitis of large intestine with perforation and abscess without bleeding: Secondary | ICD-10-CM | POA: Diagnosis not present

## 2021-02-21 DIAGNOSIS — I482 Chronic atrial fibrillation, unspecified: Secondary | ICD-10-CM

## 2021-02-21 DIAGNOSIS — R6520 Severe sepsis without septic shock: Secondary | ICD-10-CM | POA: Diagnosis not present

## 2021-02-21 DIAGNOSIS — K5792 Diverticulitis of intestine, part unspecified, without perforation or abscess without bleeding: Secondary | ICD-10-CM | POA: Diagnosis not present

## 2021-02-21 DIAGNOSIS — N179 Acute kidney failure, unspecified: Secondary | ICD-10-CM | POA: Diagnosis not present

## 2021-02-21 LAB — CBC
HCT: 40.4 % (ref 39.0–52.0)
HCT: 43.9 % (ref 39.0–52.0)
Hemoglobin: 14.1 g/dL (ref 13.0–17.0)
Hemoglobin: 15.3 g/dL (ref 13.0–17.0)
MCH: 34.5 pg — ABNORMAL HIGH (ref 26.0–34.0)
MCH: 34.6 pg — ABNORMAL HIGH (ref 26.0–34.0)
MCHC: 34.9 g/dL (ref 30.0–36.0)
MCHC: 34.9 g/dL (ref 30.0–36.0)
MCV: 98.8 fL (ref 80.0–100.0)
MCV: 99.3 fL (ref 80.0–100.0)
Platelets: 266 10*3/uL (ref 150–400)
Platelets: 311 10*3/uL (ref 150–400)
RBC: 4.09 MIL/uL — ABNORMAL LOW (ref 4.22–5.81)
RBC: 4.42 MIL/uL (ref 4.22–5.81)
RDW: 12.8 % (ref 11.5–15.5)
RDW: 12.8 % (ref 11.5–15.5)
WBC: 11.7 10*3/uL — ABNORMAL HIGH (ref 4.0–10.5)
WBC: 13.9 10*3/uL — ABNORMAL HIGH (ref 4.0–10.5)
nRBC: 0 % (ref 0.0–0.2)
nRBC: 0 % (ref 0.0–0.2)

## 2021-02-21 LAB — BASIC METABOLIC PANEL
Anion gap: 15 (ref 5–15)
BUN: 37 mg/dL — ABNORMAL HIGH (ref 8–23)
CO2: 28 mmol/L (ref 22–32)
Calcium: 9.2 mg/dL (ref 8.9–10.3)
Chloride: 93 mmol/L — ABNORMAL LOW (ref 98–111)
Creatinine, Ser: 2.19 mg/dL — ABNORMAL HIGH (ref 0.61–1.24)
GFR, Estimated: 32 mL/min — ABNORMAL LOW (ref >60)
Glucose, Bld: 114 mg/dL — ABNORMAL HIGH (ref 70–99)
Potassium: 2.7 mmol/L — CL (ref 3.5–5.1)
Sodium: 136 mmol/L (ref 135–145)

## 2021-02-21 LAB — HEPARIN LEVEL (UNFRACTIONATED)
Heparin Unfractionated: 0.17 IU/mL — ABNORMAL LOW (ref 0.30–0.70)
Heparin Unfractionated: 0.27 IU/mL — ABNORMAL LOW (ref 0.30–0.70)
Heparin Unfractionated: 0.65 IU/mL (ref 0.30–0.70)

## 2021-02-21 LAB — PHOSPHORUS: Phosphorus: 3.4 mg/dL (ref 2.5–4.6)

## 2021-02-21 LAB — MAGNESIUM: Magnesium: 1.8 mg/dL (ref 1.7–2.4)

## 2021-02-21 LAB — APTT
aPTT: 39 seconds — ABNORMAL HIGH (ref 24–36)
aPTT: 56 seconds — ABNORMAL HIGH (ref 24–36)

## 2021-02-21 MED ORDER — LORAZEPAM 1 MG PO TABS
1.0000 mg | ORAL_TABLET | ORAL | Status: AC | PRN
  Administered 2021-02-22: 2 mg via ORAL
  Filled 2021-02-21: qty 2

## 2021-02-21 MED ORDER — AMIODARONE LOAD VIA INFUSION
150.0000 mg | Freq: Once | INTRAVENOUS | Status: AC
  Administered 2021-02-21: 150 mg via INTRAVENOUS
  Filled 2021-02-21: qty 83.34

## 2021-02-21 MED ORDER — THIAMINE HCL 100 MG PO TABS: 100.0000 mg | ORAL_TABLET | Freq: Every day | ORAL | Status: DC

## 2021-02-21 MED ORDER — DILTIAZEM HCL 30 MG PO TABS
30.0000 mg | ORAL_TABLET | Freq: Four times a day (QID) | ORAL | Status: DC
  Administered 2021-02-21: 30 mg via ORAL
  Filled 2021-02-21: qty 1

## 2021-02-21 MED ORDER — FOLIC ACID 1 MG PO TABS: 1.0000 mg | ORAL_TABLET | Freq: Every day | ORAL | Status: DC

## 2021-02-21 MED ORDER — LORAZEPAM 1 MG PO TABS: 1.0000 mg | ORAL_TABLET | ORAL | Status: DC | PRN

## 2021-02-21 MED ORDER — MIDODRINE HCL 5 MG PO TABS
10.0000 mg | ORAL_TABLET | Freq: Three times a day (TID) | ORAL | Status: DC
  Administered 2021-02-23 – 2021-02-24 (×4): 10 mg via ORAL
  Filled 2021-02-21 (×5): qty 2

## 2021-02-21 MED ORDER — LORAZEPAM 2 MG/ML IJ SOLN: 1.0000 mg | INTRAMUSCULAR | Status: DC | PRN

## 2021-02-21 MED ORDER — POTASSIUM CHLORIDE 10 MEQ/100ML IV SOLN
10.0000 meq | INTRAVENOUS | Status: AC
  Administered 2021-02-21 (×6): 10 meq via INTRAVENOUS
  Filled 2021-02-21 (×6): qty 100

## 2021-02-21 MED ORDER — METOPROLOL TARTRATE 5 MG/5ML IV SOLN
5.0000 mg | INTRAVENOUS | Status: DC
  Administered 2021-02-21: 5 mg via INTRAVENOUS
  Filled 2021-02-21: qty 5

## 2021-02-21 MED ORDER — LACTATED RINGERS IV SOLN: INTRAVENOUS | Status: DC

## 2021-02-21 MED ORDER — THIAMINE HCL 100 MG/ML IJ SOLN: 100.0000 mg | Freq: Every day | INTRAMUSCULAR | Status: DC

## 2021-02-21 MED ORDER — BUTALBITAL-APAP-CAFFEINE 50-325-40 MG PO TABS: 1.0000 | ORAL_TABLET | Freq: Four times a day (QID) | ORAL | Status: DC | PRN

## 2021-02-21 MED ORDER — BUSPIRONE HCL 10 MG PO TABS
10.0000 mg | ORAL_TABLET | Freq: Three times a day (TID) | ORAL | Status: DC
  Administered 2021-02-21: 10 mg
  Filled 2021-02-21: qty 1

## 2021-02-21 MED ORDER — ADULT MULTIVITAMIN W/MINERALS CH: 1.0000 | ORAL_TABLET | Freq: Every day | ORAL | Status: DC

## 2021-02-21 MED ORDER — AMIODARONE HCL IN DEXTROSE 360-4.14 MG/200ML-% IV SOLN
60.0000 mg/h | INTRAVENOUS | Status: AC
  Administered 2021-02-21 (×2): 60 mg/h via INTRAVENOUS
  Filled 2021-02-21: qty 200

## 2021-02-21 MED ORDER — AMIODARONE HCL IN DEXTROSE 360-4.14 MG/200ML-% IV SOLN
30.0000 mg/h | INTRAVENOUS | Status: DC
  Administered 2021-02-21 – 2021-03-06 (×24): 30 mg/h via INTRAVENOUS
  Filled 2021-02-21 (×29): qty 200

## 2021-02-21 MED ORDER — LORAZEPAM 2 MG/ML IJ SOLN: 0.0000 mg | Freq: Three times a day (TID) | INTRAMUSCULAR | Status: DC

## 2021-02-21 MED ORDER — SODIUM CHLORIDE 0.9 % IV SOLN
INTRAVENOUS | Status: DC | PRN
  Administered 2021-02-21 – 2021-03-24 (×4): 500 mL via INTRAVENOUS

## 2021-02-21 MED ORDER — LORAZEPAM 2 MG/ML IJ SOLN: 0.0000 mg | INTRAMUSCULAR | Status: DC

## 2021-02-21 MED ORDER — LORAZEPAM 2 MG/ML IJ SOLN
1.0000 mg | INTRAMUSCULAR | Status: AC | PRN
  Administered 2021-02-23: 2 mg via INTRAVENOUS
  Filled 2021-02-21: qty 1

## 2021-02-21 MED ORDER — DILTIAZEM HCL 30 MG PO TABS
30.0000 mg | ORAL_TABLET | Freq: Four times a day (QID) | ORAL | Status: DC
Start: 2021-02-21 — End: 2021-02-21

## 2021-02-21 MED ORDER — MAGNESIUM SULFATE 2 GM/50ML IV SOLN
2.0000 g | Freq: Once | INTRAVENOUS | Status: AC
  Administered 2021-02-21: 2 g via INTRAVENOUS
  Filled 2021-02-21: qty 50

## 2021-02-21 NOTE — Progress Notes (Signed)
PROGRESS NOTE        PATIENT DETAILS Name: SCOEY MONTY Age: 70 y.o. Sex: male Date of Birth: 04/18/1951 Admit Date: 02/19/2021 Admitting Physician Etta Quill, DO PCP:Sun, [name], MD  Brief Narrative: Patient is a 70 y.o. male A. fib, HTN, HLD, prior diverticulitis, heavy EtOH use-who presented with lower abdominal pain/nausea/vomiting-on initial presentation to the ED found to have severe sepsis physiology with hypotension/AKI-felt to be due to perforated diverticulitis.  See below for further details.  Significant events: 9/3>> presented with lower abdominal pain/vomiting-sepsis from contained perforated diverticulitis.  Found to have very active ileus/PSBO from diverticulitis/bowel inflammation 9/4>> persistent A. fib with RVR-not responding to Lopressor/Cardizem-BP soft-starting amiodarone infusion-cardiology consulted.  Significant studies: 9/3>> CT abdomen/pelvis: Acute sigmoid diverticulitis-small extraluminal gas-fluid collection in the sigmoid mesocolon consistent with abscess.  Dilated proximal small bowel-likely ileus.  Antimicrobial therapy: Zosyn: 9/3>>  Microbiology data: 9/3>> COVID/influenza PCR: Negative 9/3>> blood culture: Negative  Procedures : None  Consults: General surgery Cardiology  DVT Prophylaxis : SCDs Start: 02/20/21 0002   Subjective: Confused-required restraints last night.  Last EtOH use was this past Friday.   Assessment/Plan: Severe sepsis due to diverticulitis with contained perforation and small abscess: Sepsis physiology has improved cultures negative so far-abdominal exam significantly better with hardly any tenderness.  Continue Zosyn-surgery allowing sips/meds orally.  Ileus: Likely sequelae of diverticulitis-NG tube pulled out by patient last night-given benign abdominal exam-plan is to just watch without NG tube reinsertion.    AKI: likely hemodynamically mediated-improving with supportive  care-avoid nephrotoxic agents.  Hypokalemia: Due to NG tube suctioning/EtOH use-continue to replete and recheck.  PAF with RVR: Provoked by sepsis and alcohol withdrawal.  No response to scheduled IV Lopressor-on oral Cardizem-BP soft this morning-we will start amiodarone infusion.  Cardiology consulted.  Remains on IV heparin.  Xarelto remains on hold in case patient requires a laparotomy.    Alcohol withdrawal: Delirious-last drink was on 9/2.  Apparently drinks approximately a pint of alcohol and multiple beers daily.  Starting scheduled IV Ativan-continue as needed Ativan per CIWA scale.  Watch closely.  HTN: All antihypertensives on hold-as BP soft.    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  Diet: Diet Order             Diet NPO time specified Except for: Ice Chips, Sips with Meds, Other (See Comments)  Diet effective now                    Code Status: Full code  Family Communication: Spouse-Jimmi Franzel-[phone] updated over the phone on 9/2.  Disposition Plan: Status is: Inpatient  Remains inpatient appropriate because:Inpatient level of care appropriate due to severity of illness  Dispo: The patient is from: Home              Anticipated d/c is to: Home              Patient currently is not medically stable to d/c.   Difficult to place patient No   Barriers to Discharge: Perforated diverticulitis with abscess-on IV antibiotics-developing alcohol withdrawal symptoms/PAF with RVR.  Antimicrobial agents: Anti-infectives (From admission, onward)    Start     Dose/Rate Route Frequency Ordered Stop   02/20/21 0300  piperacillin-tazobactam (ZOSYN) IVPB 3.375 g  3.375 g 12.5 mL/hr over 240 Minutes Intravenous Every 8 hours 02/19/21 1907     02/19/21 1900  ceFEPIme (MAXIPIME) 2 g in sodium chloride 0.9 % 100 mL IVPB  Status:  Discontinued        2 g 200 mL/hr over 30  Minutes Intravenous  Once 02/19/21 1857 02/19/21 1906   02/19/21 1900  metroNIDAZOLE (FLAGYL) IVPB 500 mg  Status:  Discontinued        500 mg 100 mL/hr over 60 Minutes Intravenous  Once 02/19/21 1857 02/19/21 1906   02/19/21 1815  piperacillin-tazobactam (ZOSYN) IVPB 3.375 g        3.375 g 100 mL/hr over 30 Minutes Intravenous  Once 02/19/21 1809 02/19/21 1944        Time spent: 35 minutes-Greater than 50% of this time was spent in counseling, explanation of diagnosis, planning of further management, and coordination of care.  MEDICATIONS: Scheduled Meds:  amiodarone  150 mg Intravenous Once   busPIRone  10 mg Per Tube TID   folic acid  1 mg Per Tube Daily   LORazepam  0-4 mg Intravenous Q4H   Followed by   Derrill Memo ON 02/23/2021] LORazepam  0-4 mg Intravenous Q8H   midodrine  10 mg Oral TID WC   multivitamin with minerals  1 tablet Per Tube Daily   nicotine  21 mg Transdermal Daily   thiamine  100 mg Per Tube Daily   Or   thiamine  100 mg Intravenous Daily   Continuous Infusions:  sodium chloride 500 mL (02/21/21 1109)   amiodarone     Followed by   amiodarone     heparin 1,350 Units/hr (02/21/21 0600)   lactated ringers Stopped (02/21/21 0328)   piperacillin-tazobactam (ZOSYN)  IV 3.375 g (02/21/21 1110)   PRN Meds:.sodium chloride, acetaminophen **OR** acetaminophen, HYDROmorphone (DILAUDID) injection, LORazepam **OR** LORazepam, menthol-cetylpyridinium, ondansetron **OR** ondansetron (ZOFRAN) IV, phenol   PHYSICAL EXAM: Vital signs: Vitals:   02/21/21 0000 02/21/21 0200 02/21/21 0321 02/21/21 0805  BP: 113/70  (!) 123/56 107/75  Pulse: (!) 105 80 95 98  Resp: (!) '22 20 20 '$ (!) 25  Temp: 98.9 F (37.2 C)  98.2 F (36.8 C) 97.6 F (36.4 C)  TempSrc: Oral  Oral Oral  SpO2: 93% 94% 95% 97%  Weight:      Height:       Filed Weights   02/19/21 1800 02/20/21 2000  Weight: 79.4 kg 79.4 kg   Body mass index is 23.74 kg/m.   Gen Exam: Somewhat confused-following  commands.  Not in any distress. HEENT:atraumatic, normocephalic Chest: B/L clear to auscultation anteriorly CVS:S1S2 regular-tachycardic. Abdomen: Soft-hardly any tenderness in the lower abdomen today. Extremities:no edema Neurology: Non focal Skin: no rash   I have personally reviewed following labs and imaging studies  LABORATORY DATA: CBC: Recent Labs  Lab 02/19/21 1653 02/19/21 1838 02/20/21 0549 02/21/21 0119 02/21/21 1101  WBC 21.8*  --  14.3* 11.7* 13.9*  NEUTROABS  --  18.3*  --   --   --   HGB 16.0  --  15.3 14.1 15.3  HCT 44.8  --  44.1 40.4 43.9  MCV 96.1  --  99.5 98.8 99.3  PLT 254  --  261 266 311     Basic Metabolic Panel: Recent Labs  Lab 02/19/21 1653 02/19/21 1838 02/20/21 0549 02/21/21 0119  NA 131*  --  132* 136  K 3.2*  --  3.4* 2.7*  CL 88*  --  92* 93*  CO2 25  --  25 28  GLUCOSE 137*  --  121* 114*  BUN 38*  --  38* 37*  CREATININE 3.23*  --  2.93* 2.19*  CALCIUM 11.0*  --  9.6 9.2  MG  --  2.0  --  1.8  PHOS  --  4.8*  --  3.4     GFR: Estimated Creatinine Clearance: 34.4 mL/min (A) (by C-G formula based on SCr of 2.19 mg/dL (H)).  Liver Function Tests: Recent Labs  Lab 02/19/21 1653 02/20/21 0549  AST 18 36  ALT 11 23  ALKPHOS 85 94  BILITOT 1.1 1.4*  PROT 7.6 6.7  ALBUMIN 3.9 2.7*    Recent Labs  Lab 02/19/21 1653  LIPASE 22    No results for input(s): AMMONIA in the last 168 hours.  Coagulation Profile: Recent Labs  Lab 02/19/21 1838  INR 1.1     Cardiac Enzymes: No results for input(s): CKTOTAL, CKMB, CKMBINDEX, TROPONINI in the last 168 hours.  BNP (last 3 results) No results for input(s): PROBNP in the last 8760 hours.  Lipid Profile: No results for input(s): CHOL, HDL, LDLCALC, TRIG, CHOLHDL, LDLDIRECT in the last 72 hours.  Thyroid Function Tests: No results for input(s): TSH, T4TOTAL, FREET4, T3FREE, THYROIDAB in the last 72 hours.  Anemia Panel: No results for input(s): VITAMINB12, FOLATE,  FERRITIN, TIBC, IRON, RETICCTPCT in the last 72 hours.  Urine analysis:    Component Value Date/Time   COLORURINE BROWN (A) 02/19/2021 1645   APPEARANCEUR CLOUDY (A) 02/19/2021 1645   LABSPEC 1.026 02/19/2021 1645   PHURINE 5.5 02/19/2021 1645   GLUCOSEU NEGATIVE 02/19/2021 1645   HGBUR LARGE (A) 02/19/2021 1645   BILIRUBINUR SMALL (A) 02/19/2021 1645   KETONESUR NEGATIVE 02/19/2021 1645   PROTEINUR >300 (A) 02/19/2021 1645   UROBILINOGEN 1.0 12/29/2013 1735   NITRITE NEGATIVE 02/19/2021 1645   LEUKOCYTESUR NEGATIVE 02/19/2021 1645    Sepsis Labs: Lactic Acid, Venous    Component Value Date/Time   LATICACIDVEN 1.3 02/19/2021 1838    MICROBIOLOGY: Recent Results (from the past 240 hour(s))  Culture, blood (routine x 2)     Status: None (Preliminary result)   Collection Time: 02/19/21  6:07 PM   Specimen: BLOOD  Result Value Ref Range Status   Specimen Description   Final    BLOOD BLOOD LEFT FOREARM Performed at [facility], [address] [place], [place] [address]    Special Requests   Final    BOTTLES DRAWN AEROBIC AND ANAEROBIC Blood Culture adequate volume Performed at [facility], [address] [place], [place] [address]    Culture   Final    NO GROWTH 2 DAYS Performed at [facility], [place] [address] [place], [place] [address]    Report Status PENDING  Incomplete  Culture, blood (routine x 2)     Status: None (Preliminary result)   Collection Time: 02/19/21  6:12 PM   Specimen: BLOOD  Result Value Ref Range Status   Specimen Description   Final    BLOOD BLOOD RIGHT FOREARM Performed at [facility], [address] [place], [place] [address]    Special Requests   Final    BOTTLES DRAWN AEROBIC AND ANAEROBIC Blood Culture adequate volume Performed at [facility], [address] [place],  [address]    Culture   Final    NO GROWTH 2 DAYS Performed at [facility], [place],  [place] [address]    Report Status PENDING  Incomplete  Resp Panel by RT-PCR (Flu A&B, Covid)     Status: None   Collection Time: 02/19/21  6:38 PM   Specimen: Nasopharyngeal(NP) swabs in vial transport medium  Result Value Ref Range Status   SARS Coronavirus 2 by RT PCR NEGATIVE NEGATIVE Final    Comment: (NOTE) SARS-CoV-2 target nucleic acids are NOT DETECTED.  The SARS-CoV-2 RNA is generally detectable in upper respiratory specimens during the acute phase of infection. The lowest concentration of SARS-CoV-2 viral copies this assay can detect is 138 copies/mL. A negative result does not preclude SARS-Cov-2 infection and should not be used as the sole basis for treatment or other patient management decisions. A negative result may occur with  improper specimen collection/handling, submission of specimen other than nasopharyngeal swab, presence of viral mutation(s) within the areas targeted by this assay, and inadequate number of viral copies(<138 copies/mL). A negative result must be combined with clinical observations, patient history, and epidemiological information. The expected result is Negative.  Fact Sheet for Patients:  EntrepreneurPulse.com.au  Fact Sheet for Healthcare Providers:  IncredibleEmployment.be  This test is no t yet approved or cleared by the [place] FDA and  has been authorized for detection and/or diagnosis of SARS-CoV-2 by FDA under an Emergency Use Authorization (EUA). This EUA will remain  in effect (meaning this test can be used) for the duration of the COVID-19 declaration under Section 564(b)(1) of the Act, 21 U.S.C.section 360bbb-3(b)(1), unless the authorization is terminated  or revoked sooner.       Influenza A by PCR NEGATIVE NEGATIVE Final   Influenza B by PCR NEGATIVE NEGATIVE Final    Comment: (NOTE) The Xpert Xpress SARS-CoV-2/FLU/RSV plus assay is intended  as an aid in the diagnosis of influenza from Nasopharyngeal swab specimens and should not be used as a sole basis for treatment. Nasal washings and aspirates are unacceptable for Xpert Xpress SARS-CoV-2/FLU/RSV testing.  Fact Sheet for Patients: EntrepreneurPulse.com.au  Fact Sheet for Healthcare Providers: IncredibleEmployment.be  This test is not yet approved or cleared by the [place] FDA and has been authorized for detection and/or diagnosis of SARS-CoV-2 by FDA under an Emergency Use Authorization (EUA). This EUA will remain in effect (meaning this test can be used) for the duration of the COVID-19 declaration under Section 564(b)(1) of the Act, 21 U.S.C. section 360bbb-3(b)(1), unless the authorization is terminated or revoked.  Performed at [facility], [address], [place],  [address]     RADIOLOGY STUDIES/RESULTS: CT Abdomen Pelvis Wo Contrast  Result Date: 02/19/2021 CLINICAL DATA:  Constipation, abdominal pain, not urinating much, decreased renal function on labs today, suspected intra-abdominal infection/abscess, history atrial fibrillation, hypertension, smoker EXAM: CT ABDOMEN AND PELVIS WITHOUT CONTRAST TECHNIQUE: Multidetector CT imaging of the abdomen and pelvis was performed following the standard protocol without IV contrast. Sagittal and coronal MPR images reconstructed from axial data set. No oral contrast administered. 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 Adrenal glands, kidneys, and ureters otherwise normal appearance. Bladder decompressed. Stomach/Bowel: Normal appendix. Scattered colonic diverticulosis. Wall thickening at sigmoid colon with infiltration of pericolic fat consistent with acute  diverticulitis. Small extraluminal gas and fluid collection identified consistent with perforation and abscess, collection measuring 3.5 x 1.5 x 2.7 cm. Small duodenal diverticulum. Dilated proximal and decompressed distal small bowel loops, question  ileus, no discrete transition zone identified. Stomach unremarkable. Vascular/Lymphatic: Atherosclerotic calcifications aorta and iliac arteries without aneurysm. Scattered pelvic phleboliths. Reproductive: Mild prostatic enlargement. Seminal vesicles unremarkable. Other: No free air or free fluid.  No hernia. Musculoskeletal: Bones demineralized. IMPRESSION: Acute sigmoid diverticulitis with a small extraluminal gas and fluid collection in the sigmoid mesocolon 3.5 x 1.5 x 2.7 cm consistent with perforation and abscess. Dilated proximal and decompressed distal small bowel loops, question ileus. Mild prostatic enlargement. 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 Aortic Atherosclerosis (ICD10-I70.0). Findings called to Dr.  Johnney Killian on 02/19/2021 at 1847 hours. Electronically Signed   By: Lavonia Dana M.D.   On: 02/19/2021 18:48   DG Chest Port 1 View  Result Date: 02/19/2021 CLINICAL DATA:  Abdominal pain and wheezing EXAM: PORTABLE CHEST 1 VIEW COMPARISON:  Portable exam 1824 hours compared to 06/28/2020 FINDINGS: Normal heart size, mediastinal contours, and pulmonary vascularity. Atherosclerotic calcification aorta. Lungs clear. No pulmonary infiltrate, pleural effusion, or pneumothorax. Osseous structures unremarkable. IMPRESSION: No acute abnormalities. Aortic Atherosclerosis (ICD10-I70.0). Electronically Signed   By: Lavonia Dana M.D.   On: 02/19/2021 18:49   DG Abd Portable 1V  Result Date: 02/20/2021 CLINICAL DATA:  Nasogastric tube placement EXAM: PORTABLE ABDOMEN - 1 VIEW COMPARISON:  Portable exam 2000 hours compared to 1537 hours FINDINGS: Tip of nasogastric tube projects over stomach, though the proximal side-port likely remains in the distal  esophagus; recommend advancing tube 3 cm. Lung bases clear. Dilated small bowel loops in upper abdomen. IMPRESSION: Recommend advancing nasogastric tube 3 cm. Electronically Signed   By: Lavonia Dana M.D.   On: 02/20/2021 20:43   DG Abd Portable 1V  Result Date: 02/20/2021 CLINICAL DATA:  Oral gastric tube placement. EXAM: PORTABLE ABDOMEN - 1 VIEW COMPARISON:  Abdominal radiograph dated 02/20/2021. FINDINGS: An enteric tube has been retracted with the tip at the gastroesophageal junction and the side port in the mid to distal esophagus. IMPRESSION: Enteric tube with tip near the gastroesophageal junction. Electronically Signed   By: Zerita Boers M.D.   On: 02/20/2021 15:47   DG Abd Portable 1 View  Result Date: 02/20/2021 CLINICAL DATA:  Nasogastric tube placement EXAM: PORTABLE ABDOMEN - 1 VIEW COMPARISON:  Abdominal CT from yesterday FINDINGS: The enteric tube tip reaches the stomach with side port at the GE junction. Dilated small bowel in the upper abdomen which is known from prior CT. IMPRESSION: Enteric tube with tip at the stomach and side port at the GE junction. Electronically Signed   By: Monte Fantasia M.D.   On: 02/20/2021 06:28     LOS: 1 day   Oren Binet, MD  [facility]    To contact the attending provider between 7A-7P or the covering provider during after hours 7P-7A, please log into the web site [email or web address] and access using universal [place] password for that web site. If you do not have the password, please call the hospital operator.  02/21/2021, 12:21 PM

## 2021-02-21 NOTE — Progress Notes (Signed)
RN notified Provider Ghimire, MD regarding patient's heart rate sustaining 150s with blood pressure 86/70 metoprolol IV and PO cardizem have been received this shift. Orders received for Amiodarone IV and PO Midodrine.

## 2021-02-21 NOTE — Progress Notes (Signed)
ANTICOAGULATION CONSULT NOTE  Pharmacy Consult for heparin Indication: atrial fibrillation  Allergies  Allergen Reactions   Atorvastatin Other (See Comments)    myalgias    Patient Measurements: Height: 6' (182.9 cm) Weight: 79.4 kg (175 lb 0.7 oz) IBW/kg (Calculated) : 77.6 Heparin Dosing Weight: 79.4kg  Vital Signs: Temp: 98.2 F (36.8 C) (09/05 0321) Temp Source: Oral (09/05 0321) BP: 123/56 (09/05 0321) Pulse Rate: 95 (09/05 0321)  Labs: Recent Labs    02/19/21 1653 02/19/21 1838 02/20/21 0549 02/20/21 1137 02/20/21 1910 02/21/21 0119  HGB 16.0  --  15.3  --   --  14.1  HCT 44.8  --  44.1  --   --  40.4  PLT 254  --  261  --   --  266  APTT  --   --   --  32 25 39*  LABPROT  --  14.6  --   --   --   --   INR  --  1.1  --   --   --   --   HEPARINUNFRC  --   --   --  0.29* 0.30 0.17*  CREATININE 3.23*  --  2.93*  --   --  2.19*     Estimated Creatinine Clearance: 34.4 mL/min (A) (by C-G formula based on SCr of 2.19 mg/dL (H)).  Assessment: 70 y/o male admitted for sepsis from contained perforated diverticulitis with active ileus. On PTA Xarelto for Afib with last dose on 9/2 at 20:00. Hgb 15.3, PLTs 311.   Heparin level 0.27 (affected by Xarelto), aPTT 56 sec (subtherapeutic) on gtt at 1350 units/hr. RN reports no line issues or interruptions. No overt signs or symptoms of bleeding, but she does report that there may be some slight blood in urine and she is monitoring closely.   Goal of Therapy:  Heparin level 0.3-0.7 units/ml aPTT 66-102 seconds Monitor platelets by anticoagulation protocol: Yes   Plan:  Increase heparin rate by ~2 u/kg/hr 1500 units/hr F/u heparin level and aPTT in 8 hours at 2100 on 9/5 Continue daily HL, aPTT and CBC DC monitoring with aPTT once it correlates with HL  Donald Pore, PharmD Pharmacy Resident 02/21/2021, 12:49 PM

## 2021-02-21 NOTE — Consult Note (Signed)
CARDIOLOGY CONSULT NOTE       Patient ID: Travis Conrad MRN: [id number] DOB/AGE: 23-Apr-1951 70 y.o.  Admit date: 02/19/2021 Referring Physician: Delene Ruffini Primary Physician: [name], MD Primary Cardiologist: [name] Reason for Consultation: Aflutter/hypotension  Principal Problem:   Diverticulitis of large intestine with perforation and abscess without bleeding Active Problems:   Atrial fibrillation, chronic ([place])   Essential hypertension   Diverticulitis with obstruction ([place])   AKI (acute kidney injury) ([place])   Severe sepsis with acute organ dysfunction [facility])   HPI:  70 y.o. with HTN, PAF post ablation 10/03/19 on sotolol for infrequent recurrences. CHADVASC 2 on xarelto prior to admission History of ETOH abuse and diverticulitis admitted with abdominal pain, nausea, vomiting sepsis from perforated diverticulitis. His xarelto has been held on heparin. Admission ECG afib on 9/3 and flutter on telemetry now Not known how long he has been in PAF. He has been Rx conservatively no surgery. Some delirium from ETOH pulled NG tube out and NPO AV nodal drugs d/c due to hypotension and started on amiodarone today No history of CAD TEE 10/03/19 EF 60-65% no significant LVH or valve disease.  He had a cardiac CT 08/18/19 prior to ablation and calcium score 1980  With negative FFR in circumflex and proximal mid LAD but RCA and distal LAD not intepretable  FFR 0.82 OM1 and 0.87 in distal circumflex   ROS All other systems reviewed and negative except as noted above  Past Medical History:  Diagnosis Date   Atrial fibrillation (HCC)    High cholesterol    "RX made groin hurt" (08/14/2016)   Hypertension     Family History  Problem Relation Age of Onset   Other Brother     Social History   Socioeconomic History   Marital status: Married    Spouse name: Not on file   Number of children: Not on file   Years of education: Not on file   Highest education level: Not on file  Occupational  History   Not on file  Tobacco Use   Smoking status: Every Day    Packs/day: 1.00    Years: 50.00    Pack years: 50.00    Types: Cigarettes   Smokeless tobacco: Never  Vaping Use   Vaping Use: Never used  Substance and Sexual Activity   Alcohol use: Not Currently    Comment: 1/2 gallon of whiskey a week   Drug use: Yes    Types: Cocaine, Marijuana    Comment: 08/14/2016 "last cocaine was in early 1990s; smoked pot yesterday"   Sexual activity: Not Currently  Other Topics Concern   Not on file  Social History Narrative   Not on file   Social Determinants of Health   Financial Resource Strain: Not on file  Food Insecurity: Not on file  Transportation Needs: Not on file  Physical Activity: Not on file  Stress: Not on file  Social Connections: Not on file  Intimate Partner Violence: Not on file    Past Surgical History:  Procedure Laterality Date   ANKLE FRACTURE SURGERY Left ~ 2012   "put rod and pins in"   [place] N/A 10/03/2019   Procedure: ATRIAL FIBRILLATION ABLATION;  Surgeon: Constance Haw, MD;  Location: [place] [facility];  Service: Cardiovascular;  Laterality: N/A;   COLONOSCOPY W/ BIOPSIES AND POLYPECTOMY  ~ 2000   FRACTURE SURGERY     ORIF FIBULA FRACTURE  06/24/2012   Procedure: OPEN REDUCTION INTERNAL FIXATION (  ORIF) FIBULA FRACTURE;  Surgeon: [name], MD;  Location: [employer] [facility];  Service: Orthopedics;  Laterality: Left;  ORIF LEFT FIBULA,  POSSIBLE REPAIR OF SYNDESMOSIS    TEE WITHOUT CARDIOVERSION N/A 10/03/2019   Procedure: TRANSESOPHAGEAL ECHOCARDIOGRAM (TEE);  Surgeon: Constance Haw, MD;  Location: [place] [facility];  Service: Cardiovascular;  Laterality: N/A;      Current Facility-Administered Medications:    0.9 %  sodium chloride infusion, , Intravenous, PRN, Ghimire, Henreitta Leber, MD, Last Rate: 10 mL/hr at 02/21/21 1109, 500 mL at 02/21/21 1109   acetaminophen (TYLENOL) tablet 650 mg, 650 mg, Oral, Q6H PRN **OR**  acetaminophen (TYLENOL) suppository 650 mg, 650 mg, Rectal, Q6H PRN, Alcario Drought, Jared M, DO   amiodarone (NEXTERONE PREMIX) 360-4.14 MG/200ML-% (1.8 mg/mL) IV infusion, 60 mg/hr, Intravenous, Continuous **FOLLOWED BY** amiodarone (NEXTERONE PREMIX) 360-4.14 MG/200ML-% (1.8 mg/mL) IV infusion, 30 mg/hr, Intravenous, Continuous, Ghimire, Henreitta Leber, MD   amiodarone (NEXTERONE) 1.8 mg/mL load via infusion 150 mg, 150 mg, Intravenous, Once, Ghimire, Henreitta Leber, MD   folic acid (FOLVITE) tablet 1 mg, 1 mg, Per Tube, Daily, Ghimire, Henreitta Leber, MD, 1 mg at 02/21/21 0930   heparin ADULT infusion 100 units/mL (25000 units/271m), 1,350 Units/hr, Intravenous, Continuous, Ghimire, SHenreitta Leber MD, Last Rate: 13.5 mL/hr at 02/21/21 0600, 1,350 Units/hr at 02/21/21 0600   HYDROmorphone (DILAUDID) injection 0.5-1 mg, 0.5-1 mg, Intravenous, Q2H PRN, GEtta Quill DO, 1 mg at 02/21/21 1149   lactated ringers infusion, , Intravenous, Continuous, Ghimire, SHenreitta Leber MD, Last Rate: 125 mL/hr at 02/21/21 1254, New Bag at 02/21/21 1254   LORazepam (ATIVAN) injection 0-4 mg, 0-4 mg, Intravenous, Q4H **FOLLOWED BY** [START ON 02/23/2021] LORazepam (ATIVAN) injection 0-4 mg, 0-4 mg, Intravenous, Q8H, Ghimire, Shanker M, MD   LORazepam (ATIVAN) tablet 1-4 mg, 1-4 mg, Oral, Q1H PRN **OR** LORazepam (ATIVAN) injection 1-4 mg, 1-4 mg, Intravenous, Q1H PRN, Ghimire, SHenreitta Leber MD   menthol-cetylpyridinium (CEPACOL) lozenge 3 mg, 1 lozenge, Oral, PRN, OSaverio Danker PA-C   midodrine (PROAMATINE) tablet 10 mg, 10 mg, Oral, TID WC, Ghimire, SHenreitta Leber MD   multivitamin with minerals tablet 1 tablet, 1 tablet, Per Tube, Daily, Ghimire, SHenreitta Leber MD   nicotine (NICODERM CQ - dosed in mg/24 hours) patch 21 mg, 21 mg, Transdermal, Daily, Ghimire, Shanker M, MD, 21 mg at 02/21/21 0930   ondansetron (ZOFRAN) tablet 4 mg, 4 mg, Oral, Q6H PRN **OR** ondansetron (ZOFRAN) injection 4 mg, 4 mg, Intravenous, Q6H PRN, GAlcario Drought Jared M, DO, 4 mg at  02/21/21 1103   phenol (CHLORASEPTIC) mouth spray 1 spray, 1 spray, Mouth/Throat, PRN, OSaverio Danker PA-C, 1 spray at 02/20/21 0956   piperacillin-tazobactam (ZOSYN) IVPB 3.375 g, 3.375 g, Intravenous, Q8H, Pfeiffer, MJeannie Done MD, Last Rate: 12.5 mL/hr at 02/21/21 1110, 3.375 g at 02/21/21 1110   thiamine tablet 100 mg, 100 mg, Per Tube, Daily **OR** thiamine (B-1) injection 100 mg, 100 mg, Intravenous, Daily, Ghimire, Shanker M, MD, 100 mg at 02/21/21 0930  amiodarone  150 mg Intravenous Once   folic acid  1 mg Per Tube Daily   LORazepam  0-4 mg Intravenous Q4H   Followed by   [Derrill MemoON 02/23/2021] LORazepam  0-4 mg Intravenous Q8H   midodrine  10 mg Oral TID WC   multivitamin with minerals  1 tablet Per Tube Daily   nicotine  21 mg Transdermal Daily   thiamine  100 mg Per Tube Daily   Or   thiamine  100 mg Intravenous Daily  sodium chloride 500 mL (02/21/21 1109)   amiodarone     Followed by   amiodarone     heparin 1,350 Units/hr (02/21/21 0600)   lactated ringers 125 mL/hr at 02/21/21 1254   piperacillin-tazobactam (ZOSYN)  IV 3.375 g (02/21/21 1110)    Physical Exam: Blood pressure (!) 86/70, pulse 74, temperature (!) 97.2 F (36.2 C), temperature source Axillary, resp. rate 18, height 6' (1.829 m), weight 79.4 kg, SpO2 93 %.   Delirium Chronically ill  HEENT: normal Neck supple with no adenopathy JVP normal no bruits no thyromegaly Lungs clear with no wheezing and good diaphragmatic motion Heart:  S1/S2 no murmur, no rub, gallop or click PMI normal Abdomen: BS positive mild tympany  Distal pulses intact with no bruits No edema Neuro non-focal Skin warm and dry No muscular weakness   Labs:   Lab Results  Component Value Date   WBC 13.9 (H) 02/21/2021   HGB 15.3 02/21/2021   HCT 43.9 02/21/2021   MCV 99.3 02/21/2021   PLT 311 02/21/2021    Recent Labs  Lab 02/20/21 0549 02/21/21 0119  NA 132* 136  K 3.4* 2.7*  CL 92* 93*  CO2 25 28  BUN 38* 37*   CREATININE 2.93* 2.19*  CALCIUM 9.6 9.2  PROT 6.7  --   BILITOT 1.4*  --   ALKPHOS 94  --   ALT 23  --   AST 36  --   GLUCOSE 121* 114*   Lab Results  Component Value Date   TROPONINI <0.30 07/12/2013    Lab Results  Component Value Date   CHOL 102 09/30/2019   CHOL 211 (H) 05/02/2019   CHOL 212 (H) 06/04/2015   Lab Results  Component Value Date   HDL 35 (L) 09/30/2019   HDL 38 (L) 05/02/2019   HDL 43 06/04/2015   Lab Results  Component Value Date   LDLCALC 32 09/30/2019   LDLCALC 140 (H) 05/02/2019   LDLCALC 136 (H) 06/04/2015   Lab Results  Component Value Date   TRIG 225 (H) 09/30/2019   TRIG 183 (H) 05/02/2019   TRIG 167 (H) 06/04/2015   Lab Results  Component Value Date   CHOLHDL 2.9 09/30/2019   CHOLHDL 5.6 (H) 05/02/2019   CHOLHDL 4.9 06/04/2015   No results found for: LDLDIRECT    Radiology: CT Abdomen Pelvis Wo Contrast  Result Date: 02/19/2021 CLINICAL DATA:  Constipation, abdominal pain, not urinating much, decreased renal function on labs today, suspected intra-abdominal infection/abscess, history atrial fibrillation, hypertension, smoker EXAM: CT ABDOMEN AND PELVIS WITHOUT CONTRAST TECHNIQUE: Multidetector CT imaging of the abdomen and pelvis was performed following the standard protocol without IV contrast. Sagittal and coronal MPR images reconstructed from axial data set. No oral contrast administered. 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 Adrenal glands, kidneys, and ureters otherwise normal appearance. Bladder decompressed. Stomach/Bowel: Normal appendix. Scattered colonic diverticulosis. Wall thickening at sigmoid colon with infiltration of pericolic fat consistent with acute diverticulitis. Small extraluminal gas and fluid collection identified  consistent with perforation and abscess, collection measuring 3.5 x 1.5 x 2.7 cm. Small duodenal diverticulum. Dilated proximal and decompressed distal small bowel loops, question ileus, no discrete transition zone identified. Stomach unremarkable. Vascular/Lymphatic: Atherosclerotic calcifications aorta and iliac arteries without aneurysm. Scattered pelvic phleboliths. Reproductive: Mild prostatic enlargement. Seminal vesicles unremarkable. Other: No free air or free  fluid.  No hernia. Musculoskeletal: Bones demineralized. IMPRESSION: Acute sigmoid diverticulitis with a small extraluminal gas and fluid collection in the sigmoid mesocolon 3.5 x 1.5 x 2.7 cm consistent with perforation and abscess. Dilated proximal and decompressed distal small bowel loops, question ileus. Mild prostatic enlargement. 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 Aortic Atherosclerosis (ICD10-I70.0). Findings called to Dr.  Johnney Killian on 02/19/2021 at 1847 hours. Electronically Signed   By: Lavonia Dana M.D.   On: 02/19/2021 18:48   DG Chest Port 1 View  Result Date: 02/19/2021 CLINICAL DATA:  Abdominal pain and wheezing EXAM: PORTABLE CHEST 1 VIEW COMPARISON:  Portable exam 1824 hours compared to 06/28/2020 FINDINGS: Normal heart size, mediastinal contours, and pulmonary vascularity. Atherosclerotic calcification aorta. Lungs clear. No pulmonary infiltrate, pleural effusion, or pneumothorax. Osseous structures unremarkable. IMPRESSION: No acute abnormalities. Aortic Atherosclerosis (ICD10-I70.0). Electronically Signed   By: Lavonia Dana M.D.   On: 02/19/2021 18:49   DG Abd Portable 1V  Result Date: 02/20/2021 CLINICAL DATA:  Nasogastric tube placement EXAM: PORTABLE ABDOMEN - 1 VIEW COMPARISON:  Portable exam 2000 hours compared to 1537 hours FINDINGS: Tip of nasogastric tube projects over stomach, though the proximal side-port likely remains in the distal esophagus; recommend advancing tube 3 cm. Lung bases clear. Dilated small  bowel loops in upper abdomen. IMPRESSION: Recommend advancing nasogastric tube 3 cm. Electronically Signed   By: Lavonia Dana M.D.   On: 02/20/2021 20:43   DG Abd Portable 1V  Result Date: 02/20/2021 CLINICAL DATA:  Oral gastric tube placement. EXAM: PORTABLE ABDOMEN - 1 VIEW COMPARISON:  Abdominal radiograph dated 02/20/2021. FINDINGS: An enteric tube has been retracted with the tip at the gastroesophageal junction and the side port in the mid to distal esophagus. IMPRESSION: Enteric tube with tip near the gastroesophageal junction. Electronically Signed   By: Zerita Boers M.D.   On: 02/20/2021 15:47   DG Abd Portable 1 View  Result Date: 02/20/2021 CLINICAL DATA:  Nasogastric tube placement EXAM: PORTABLE ABDOMEN - 1 VIEW COMPARISON:  Abdominal CT from yesterday FINDINGS: The enteric tube tip reaches the stomach with side port at the GE junction. Dilated small bowel in the upper abdomen which is known from prior CT. IMPRESSION: Enteric tube with tip at the stomach and side port at the GE junction. Electronically Signed   By: Monte Fantasia M.D.   On: 02/20/2021 06:28    EKG: afib    ASSESSMENT AND PLAN:   PAF:  post ablation 10/03/19 with recurrence on sotolol as outpatient now amiodarone as NPO and BP soft. Continue heparin as not clear that he will need surgery for perforation  Hypotension:  in setting of sepsis Hydrate with NS as NPO EF normal by echo and azotemic Also written for midodrine per primary service  Diverticular Perforation:  KUB with dilated small bowel NG tube pulled out this am continue Zosyn  ETOH:  delirium  on haldol and thiamine   Signed: Jenkins Rouge 02/21/2021, 12:59 PM

## 2021-02-21 NOTE — Progress Notes (Signed)
Subjective: NGT has been pulled out twice.  Patient very confused, in ETOH withdrawal.  No further BM per RN.  Patient denies nausea for what it's worth.  ROS: See above, otherwise other systems negative  Objective: Vital signs in last 24 hours: Temp:  [97.6 F (36.4 C)-98.9 F (37.2 C)] 97.6 F (36.4 C) (09/05 0805) Pulse Rate:  [74-105] 98 (09/05 0805) Resp:  [15-25] 25 (09/05 0805) BP: (94-123)/(53-90) 107/75 (09/05 0805) SpO2:  [91 %-97 %] 97 % (09/05 0805) Weight:  [79.4 kg] 79.4 kg (09/04 2000) Last BM Date: 02/20/21  Intake/Output from previous day: 09/04 0701 - 09/05 0700 In: 2809.4 [I.V.:2575.4; IV Piggyback:234] Out: 1750 [Urine:200; Emesis/NG output:900; Stool:100] Intake/Output this shift: No intake/output data recorded.  PE: Gen: NAD, but glassy eyes and confused Heart: irregularly irregular Lungs: CTAB Abd: soft, NT, ND, +BS   Lab Results:  Recent Labs    02/20/21 0549 02/21/21 0119  WBC 14.3* 11.7*  HGB 15.3 14.1  HCT 44.1 40.4  PLT 261 266   BMET Recent Labs    02/20/21 0549 02/21/21 0119  NA 132* 136  K 3.4* 2.7*  CL 92* 93*  CO2 25 28  GLUCOSE 121* 114*  BUN 38* 37*  CREATININE 2.93* 2.19*  CALCIUM 9.6 9.2   PT/INR Recent Labs    02/19/21 1838  LABPROT 14.6  INR 1.1   CMP     Component Value Date/Time   NA 136 02/21/2021 0119   NA 134 09/30/2019 0959   NA 142 03/03/2016 0921   K 2.7 (LL) 02/21/2021 0119   K 4.3 03/03/2016 0921   CL 93 (L) 02/21/2021 0119   CO2 28 02/21/2021 0119   CO2 25 03/03/2016 0921   GLUCOSE 114 (H) 02/21/2021 0119   GLUCOSE 89 03/03/2016 0921   BUN 37 (H) 02/21/2021 0119   BUN 11 09/30/2019 0959   BUN 13.6 03/03/2016 0921   CREATININE 2.19 (H) 02/21/2021 0119   CREATININE 1.2 03/03/2016 0921   CALCIUM 9.2 02/21/2021 0119   CALCIUM 9.6 03/03/2016 0921   PROT 6.7 02/20/2021 0549   PROT 7.3 03/03/2016 0921   ALBUMIN 2.7 (L) 02/20/2021 0549   ALBUMIN 3.8 03/03/2016 0921   AST 36  02/20/2021 0549   AST 36 (H) 03/03/2016 0921   ALT 23 02/20/2021 0549   ALT 31 03/03/2016 0921   ALKPHOS 94 02/20/2021 0549   ALKPHOS 116 03/03/2016 0921   BILITOT 1.4 (H) 02/20/2021 0549   BILITOT 0.71 03/03/2016 0921   GFRNONAA 32 (L) 02/21/2021 0119   GFRNONAA 67 07/25/2013 1712   GFRAA >60 10/03/2019 0930   GFRAA 77 07/25/2013 1712   Lipase     Component Value Date/Time   LIPASE 22 02/19/2021 1653       Studies/Results: CT Abdomen Pelvis Wo Contrast  Result Date: 02/19/2021 CLINICAL DATA:  Constipation, abdominal pain, not urinating much, decreased renal function on labs today, suspected intra-abdominal infection/abscess, history atrial fibrillation, hypertension, smoker EXAM: CT ABDOMEN AND PELVIS WITHOUT CONTRAST TECHNIQUE: Multidetector CT imaging of the abdomen and pelvis was performed following the standard protocol without IV contrast. Sagittal and coronal MPR images reconstructed from axial data set. No oral contrast administered. 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 Adrenal glands, kidneys, and ureters otherwise normal appearance. Bladder decompressed. Stomach/Bowel: Normal appendix. Scattered colonic diverticulosis. Wall thickening  at sigmoid colon with infiltration of pericolic fat consistent with acute diverticulitis. Small extraluminal gas and fluid collection identified consistent with perforation and abscess, collection measuring 3.5 x 1.5 x 2.7 cm. Small duodenal diverticulum. Dilated proximal and decompressed distal small bowel loops, question ileus, no discrete transition zone identified. Stomach unremarkable. Vascular/Lymphatic: Atherosclerotic calcifications aorta and iliac arteries without aneurysm. Scattered pelvic phleboliths. Reproductive: Mild prostatic  enlargement. Seminal vesicles unremarkable. Other: No free air or free fluid.  No hernia. Musculoskeletal: Bones demineralized. IMPRESSION: Acute sigmoid diverticulitis with a small extraluminal gas and fluid collection in the sigmoid mesocolon 3.5 x 1.5 x 2.7 cm consistent with perforation and abscess. Dilated proximal and decompressed distal small bowel loops, question ileus. Mild prostatic enlargement. 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 Aortic Atherosclerosis (ICD10-I70.0). Findings called to Dr.  Johnney Killian on 02/19/2021 at 1847 hours. Electronically Signed   By: Lavonia Dana M.D.   On: 02/19/2021 18:48   DG Chest Port 1 View  Result Date: 02/19/2021 CLINICAL DATA:  Abdominal pain and wheezing EXAM: PORTABLE CHEST 1 VIEW COMPARISON:  Portable exam 1824 hours compared to 06/28/2020 FINDINGS: Normal heart size, mediastinal contours, and pulmonary vascularity. Atherosclerotic calcification aorta. Lungs clear. No pulmonary infiltrate, pleural effusion, or pneumothorax. Osseous structures unremarkable. IMPRESSION: No acute abnormalities. Aortic Atherosclerosis (ICD10-I70.0). Electronically Signed   By: Lavonia Dana M.D.   On: 02/19/2021 18:49   DG Abd Portable 1V  Result Date: 02/20/2021 CLINICAL DATA:  Nasogastric tube placement EXAM: PORTABLE ABDOMEN - 1 VIEW COMPARISON:  Portable exam 2000 hours compared to 1537 hours FINDINGS: Tip of nasogastric tube projects over stomach, though the proximal side-port likely remains in the distal esophagus; recommend advancing tube 3 cm. Lung bases clear. Dilated small bowel loops in upper abdomen. IMPRESSION: Recommend advancing nasogastric tube 3 cm. Electronically Signed   By: Lavonia Dana M.D.   On: 02/20/2021 20:43   DG Abd Portable 1V  Result Date: 02/20/2021 CLINICAL DATA:  Oral gastric tube placement. EXAM: PORTABLE ABDOMEN - 1 VIEW COMPARISON:  Abdominal radiograph dated 02/20/2021. FINDINGS: An enteric tube has been retracted with the tip at the  gastroesophageal junction and the side port in the mid to distal esophagus. IMPRESSION: Enteric tube with tip near the gastroesophageal junction. Electronically Signed   By: Zerita Boers M.D.   On: 02/20/2021 15:47   DG Abd Portable 1 View  Result Date: 02/20/2021 CLINICAL DATA:  Nasogastric tube placement EXAM: PORTABLE ABDOMEN - 1 VIEW COMPARISON:  Abdominal CT from yesterday FINDINGS: The enteric tube tip reaches the stomach with side port at the GE junction. Dilated small bowel in the upper abdomen which is known from prior CT. IMPRESSION: Enteric tube with tip at the stomach and side port at the GE junction. Electronically Signed   By: Monte Fantasia M.D.   On: 02/20/2021 06:28    Anti-infectives: Anti-infectives (From admission, onward)    Start     Dose/Rate Route Frequency Ordered Stop   02/20/21 0300  piperacillin-tazobactam (ZOSYN) IVPB 3.375 g        3.375 g 12.5 mL/hr over 240 Minutes Intravenous Every 8 hours 02/19/21 1907     02/19/21 1900  ceFEPIme (MAXIPIME) 2 g in sodium chloride 0.9 % 100 mL IVPB  Status:  Discontinued        2 g 200 mL/hr over 30 Minutes Intravenous  Once 02/19/21 1857 02/19/21 1906   02/19/21 1900  metroNIDAZOLE (FLAGYL) IVPB 500 mg  Status:  Discontinued  500 mg 100 mL/hr over 60 Minutes Intravenous  Once 02/19/21 1857 02/19/21 1906   02/19/21 1815  piperacillin-tazobactam (ZOSYN) IVPB 3.375 g        3.375 g 100 mL/hr over 30 Minutes Intravenous  Once 02/19/21 1809 02/19/21 1944        Assessment/Plan Acute sigmoid diverticulitis with contained perforation and abscess -not drainable -cont zosyn and abx therapy -leave NPO today but allows sips of clears from floor and see how he does today.  Difficult to tell due to mental state how his nausea is, etc. -WBC almost normalized today to 11K -hope for improvement without surgical intervention -will need c-scope in about 6 weeks after DC  FEN - NPO x sips from floor/IVFs VTE - heparin  gtt ID - zosyn  A fib AKI Hypokalemia HTN ETOH withdrawal   LOS: 1 day    Henreitta Cea , [facility] 02/21/2021, 8:14 AM Please see Amion for pager number during day hours 7:00am-4:30pm or 7:00am -11:30am on weekends

## 2021-02-21 NOTE — Progress Notes (Signed)
Notified by RN. That patient having anxiety, jittery. Admitted to RN that he drinks whiskey daily but hasn't had a drink in past 3 days.    Placed on CIWA protocol and given ativan with elevated CIWA score, start Buspar, folic acid, thiamine, MVI. Monitor closely

## 2021-02-21 NOTE — Progress Notes (Addendum)
ANTICOAGULATION CONSULT NOTE  Pharmacy Consult for heparin Indication: atrial fibrillation  Allergies  Allergen Reactions   Atorvastatin Other (See Comments)    myalgias    Patient Measurements: Height: 6' (182.9 cm) Weight: 79.4 kg (175 lb 0.7 oz) IBW/kg (Calculated) : 77.6 Heparin Dosing Weight: 79.4kg  Vital Signs: Temp: 98 F (36.7 C) (09/04 1618) Temp Source: Oral (09/04 1618) BP: 113/79 (09/04 1800) Pulse Rate: 87 (09/04 1618)  Labs: Recent Labs    02/19/21 1653 02/19/21 1838 02/20/21 0549 02/20/21 1137 02/20/21 1910 02/21/21 0119  HGB 16.0  --  15.3  --   --  14.1  HCT 44.8  --  44.1  --   --  40.4  PLT 254  --  261  --   --  266  APTT  --   --   --  32 25 39*  LABPROT  --  14.6  --   --   --   --   INR  --  1.1  --   --   --   --   HEPARINUNFRC  --   --   --  0.29* 0.30 0.17*  CREATININE 3.23*  --  2.93*  --   --   --      Estimated Creatinine Clearance: 25.7 mL/min (A) (by C-G formula based on SCr of 2.93 mg/dL (H)).  Assessment: 70 y/o male admitted for sepsis from contained perforated diverticulitis with active ileus. On PTA Xarelto for Afib with last dose on 9/2 at 20:00. Hgb 15.3, PLTs 261.   Heparin level 0.17 (affected by Xarelto), aPTT 39 sec (subtherapeutic) on gtt at 1350 units/hr. RN reports that pt pulled out line ~0100 (labs drawn at ~0120 so likely inaccurate). Restarted heparin at 0230. No bleeding noted.  Goal of Therapy:  Heparin level 0.3-0.7 units/ml aPTT 66-102 seconds Monitor platelets by anticoagulation protocol: Yes   Plan:  Continue heparin at 1350 units/hr F/u heparin level and aPTT 8 hr post restart  Sherlon Handing, PharmD, BCPS Please see amion for complete clinical pharmacist phone list 02/21/2021 2:35 AM

## 2021-02-21 NOTE — Progress Notes (Signed)
Wilmer Floor paged to notified of patient's increasing nausea and distention. Wilmer Floor was off service and notified Dr. Donne Hazel.  Dr Donne Hazel gave verbal orders for an abdominal Xray.

## 2021-02-21 NOTE — Progress Notes (Signed)
Dr. Sloan Leiter notified of patient's blood pressure continuing to drop into the 80s SBP and inability to give PO Midodrine. MD requesting check of BP in 15-30 minutes. Upon recheck blood pressure improved over 100 SBP.

## 2021-02-22 DIAGNOSIS — N179 Acute kidney failure, unspecified: Secondary | ICD-10-CM | POA: Diagnosis not present

## 2021-02-22 DIAGNOSIS — I480 Paroxysmal atrial fibrillation: Secondary | ICD-10-CM

## 2021-02-22 DIAGNOSIS — K5792 Diverticulitis of intestine, part unspecified, without perforation or abscess without bleeding: Secondary | ICD-10-CM | POA: Diagnosis not present

## 2021-02-22 DIAGNOSIS — I482 Chronic atrial fibrillation, unspecified: Secondary | ICD-10-CM | POA: Diagnosis not present

## 2021-02-22 DIAGNOSIS — K5720 Diverticulitis of large intestine with perforation and abscess without bleeding: Secondary | ICD-10-CM | POA: Diagnosis not present

## 2021-02-22 LAB — BASIC METABOLIC PANEL
Anion gap: 10 (ref 5–15)
Anion gap: 9 (ref 5–15)
BUN: 28 mg/dL — ABNORMAL HIGH (ref 8–23)
BUN: 19 mg/dL (ref 8–23)
CO2: 31 mmol/L (ref 22–32)
CO2: 34 mmol/L — ABNORMAL HIGH (ref 22–32)
Calcium: 8.3 mg/dL — ABNORMAL LOW (ref 8.9–10.3)
Calcium: 8.5 mg/dL — ABNORMAL LOW (ref 8.9–10.3)
Chloride: 92 mmol/L — ABNORMAL LOW (ref 98–111)
Chloride: 94 mmol/L — ABNORMAL LOW (ref 98–111)
Creatinine, Ser: 1.43 mg/dL — ABNORMAL HIGH (ref 0.61–1.24)
Creatinine, Ser: 1.36 mg/dL — ABNORMAL HIGH (ref 0.61–1.24)
GFR, Estimated: 53 mL/min — ABNORMAL LOW (ref >60)
GFR, Estimated: 56 mL/min — ABNORMAL LOW (ref >60)
Glucose, Bld: 238 mg/dL — ABNORMAL HIGH (ref 70–99)
Glucose, Bld: 113 mg/dL — ABNORMAL HIGH (ref 70–99)
Potassium: 2.6 mmol/L — CL (ref 3.5–5.1)
Potassium: 3.4 mmol/L — ABNORMAL LOW (ref 3.5–5.1)
Sodium: 133 mmol/L — ABNORMAL LOW (ref 135–145)
Sodium: 137 mmol/L (ref 135–145)

## 2021-02-22 LAB — APTT
aPTT: 104 seconds — ABNORMAL HIGH (ref 24–36)
aPTT: 118 seconds — ABNORMAL HIGH (ref 24–36)

## 2021-02-22 LAB — CBC
HCT: 38.8 % — ABNORMAL LOW (ref 39.0–52.0)
Hemoglobin: 13.5 g/dL (ref 13.0–17.0)
MCH: 34.8 pg — ABNORMAL HIGH (ref 26.0–34.0)
MCHC: 34.8 g/dL (ref 30.0–36.0)
MCV: 100 fL (ref 80.0–100.0)
Platelets: 278 10*3/uL (ref 150–400)
RBC: 3.88 MIL/uL — ABNORMAL LOW (ref 4.22–5.81)
RDW: 13 % (ref 11.5–15.5)
WBC: 12.8 10*3/uL — ABNORMAL HIGH (ref 4.0–10.5)
nRBC: 0 % (ref 0.0–0.2)

## 2021-02-22 LAB — GLUCOSE, CAPILLARY
Glucose-Capillary: 119 mg/dL — ABNORMAL HIGH (ref 70–99)
Glucose-Capillary: 99 mg/dL (ref 70–99)
Glucose-Capillary: 106 mg/dL — ABNORMAL HIGH (ref 70–99)
Glucose-Capillary: 106 mg/dL — ABNORMAL HIGH (ref 70–99)

## 2021-02-22 LAB — MAGNESIUM: Magnesium: 2.1 mg/dL (ref 1.7–2.4)

## 2021-02-22 LAB — HEPARIN LEVEL (UNFRACTIONATED): Heparin Unfractionated: 0.41 IU/mL (ref 0.30–0.70)

## 2021-02-22 MED ORDER — POTASSIUM CHLORIDE 2 MEQ/ML IV SOLN
INTRAVENOUS | Status: AC
  Filled 2021-02-22 (×9): qty 1000

## 2021-02-22 MED ORDER — PANTOPRAZOLE SODIUM 40 MG IV SOLR
40.0000 mg | INTRAVENOUS | Status: DC
  Administered 2021-02-22 – 2021-03-12 (×19): 40 mg via INTRAVENOUS
  Filled 2021-02-22 (×19): qty 40

## 2021-02-22 MED ORDER — POTASSIUM CHLORIDE 10 MEQ/100ML IV SOLN
10.0000 meq | INTRAVENOUS | Status: AC
  Administered 2021-02-22 (×6): 10 meq via INTRAVENOUS
  Filled 2021-02-22 (×6): qty 100

## 2021-02-22 MED ORDER — PANTOPRAZOLE SODIUM 40 MG PO TBEC: 40.0000 mg | DELAYED_RELEASE_TABLET | Freq: Every day | ORAL | Status: DC

## 2021-02-22 NOTE — Progress Notes (Signed)
Subjective: Mental status improved - A&[name] this am. NGT replaced yesterday afternoon after continued nausea and emesis. No nausea/emesis since. He denies abdominal pain. He states he had a BM yesterday and is passing flatus today He begins to have hiccups while I am in the room  Objective: Vital signs in last 24 hours: Temp:  [97.2 F (36.2 C)-98.6 F (37 C)] 98.1 F (36.7 C) (09/06 0700) Pulse Rate:  [34-146] 79 (09/06 0800) Resp:  [14-29] 22 (09/06 0800) BP: (85-153)/(46-104) 125/70 (09/06 0800) SpO2:  [82 %-100 %] 100 % (09/06 0800) Last BM Date: 02/21/21  Intake/Output from previous day: 09/05 0701 - 09/06 0700 In: 4533.7 [I.V.:3619.7; IV Piggyback:914.1] Out: 1535 [Urine:785; Emesis/NG output:750] Intake/Output this shift: No intake/output data recorded.  PE: Gen: NAD, resting in bed comfortably Heart: regular rate and rhythm this am Lungs: respiratory effort nonlabored on supplemental O2 via [place] Abd: soft, NT, +BS. Mildly distended. NGT with 300 ml dark bilious output MSK: all 4 extremities symmetric without cyanosis or edema. No calf TTP bilaterally   Lab Results:  Recent Labs    02/21/21 1101 02/22/21 0022  WBC 13.9* 12.8*  HGB 15.3 13.5  HCT 43.9 38.8*  PLT 311 278    BMET Recent Labs    02/21/21 0119 02/22/21 0022  NA 136 133*  K 2.7* 2.6*  CL 93* 92*  CO2 28 31  GLUCOSE 114* 238*  BUN 37* 28*  CREATININE 2.19* 1.43*  CALCIUM 9.2 8.3*    PT/INR Recent Labs    02/19/21 1838  LABPROT 14.6  INR 1.1    CMP     Component Value Date/Time   NA 133 (L) 02/22/2021 0022   NA 134 09/30/2019 0959   NA 142 03/03/2016 0921   K 2.6 (LL) 02/22/2021 0022   K 4.3 03/03/2016 0921   CL 92 (L) 02/22/2021 0022   CO2 31 02/22/2021 0022   CO2 25 03/03/2016 0921   GLUCOSE 238 (H) 02/22/2021 0022   GLUCOSE 89 03/03/2016 0921   BUN 28 (H) 02/22/2021 0022   BUN 11 09/30/2019 0959   BUN 13.6 03/03/2016 0921   CREATININE 1.43 (H) 02/22/2021 0022    CREATININE 1.2 03/03/2016 0921   CALCIUM 8.3 (L) 02/22/2021 0022   CALCIUM 9.6 03/03/2016 0921   PROT 6.7 02/20/2021 0549   PROT 7.3 03/03/2016 0921   ALBUMIN 2.7 (L) 02/20/2021 0549   ALBUMIN 3.8 03/03/2016 0921   AST 36 02/20/2021 0549   AST 36 (H) 03/03/2016 0921   ALT 23 02/20/2021 0549   ALT 31 03/03/2016 0921   ALKPHOS 94 02/20/2021 0549   ALKPHOS 116 03/03/2016 0921   BILITOT 1.4 (H) 02/20/2021 0549   BILITOT 0.71 03/03/2016 0921   GFRNONAA 53 (L) 02/22/2021 0022   GFRNONAA 67 07/25/2013 1712   GFRAA >60 10/03/2019 0930   GFRAA 77 07/25/2013 1712   Lipase     Component Value Date/Time   LIPASE 22 02/19/2021 1653       Studies/Results: DG Abd 1 View  Result Date: 02/21/2021 CLINICAL DATA:  Abdominal pain. Diverticulitis of large intestine with perforation and abscess without bleeding. EXAM: ABDOMEN - 1 VIEW COMPARISON:  Radiographs yesterday.  CT 02/19/2021 FINDINGS: Divided supine views of the abdomen obtained. The enteric tube is not definitively seen on the current exam. There is gaseous gastric distension. There is progressive gaseous small bowel distension, measuring up to 5.1 cm in the left abdomen. Air throughout the colon. The extraluminal  gas and fluid collection on prior CT is not confidently seen by radiograph. No large volume pneumoperitoneum on this supine exam. IMPRESSION: Gaseous gastric distension. Progressive gaseous small bowel distension, measuring up to 5.1 cm in the left abdomen. Query generalized ileus versus developing obstruction. Electronically Signed   By: Keith Rake M.D.   On: 02/21/2021 18:23   DG Abd Portable 1V  Result Date: 02/21/2021 CLINICAL DATA:  NG tube placement. EXAM: PORTABLE ABDOMEN - 1 VIEW COMPARISON:  Radiograph earlier today. FINDINGS: Tip of the enteric tube is below the diaphragm in the stomach. The side-port is in the region of the gastroesophageal junction. There is slight decreased gaseous gastric distension. Dilated  small bowel in the upper abdomen is grossly similar. IMPRESSION: 1. Tip of the enteric tube below the diaphragm in the stomach, side-port in the region of the gastroesophageal junction. Consider advancement of 3-4 cm for optimal placement. Slight decreased gaseous gastric distension. 2. Dilated small bowel in the upper abdomen, similar to earlier today. Electronically Signed   By: Keith Rake M.D.   On: 02/21/2021 23:25   DG Abd Portable 1V  Result Date: 02/20/2021 CLINICAL DATA:  Nasogastric tube placement EXAM: PORTABLE ABDOMEN - 1 VIEW COMPARISON:  Portable exam 2000 hours compared to 1537 hours FINDINGS: Tip of nasogastric tube projects over stomach, though the proximal side-port likely remains in the distal esophagus; recommend advancing tube 3 cm. Lung bases clear. Dilated small bowel loops in upper abdomen. IMPRESSION: Recommend advancing nasogastric tube 3 cm. Electronically Signed   By: Lavonia Dana M.D.   On: 02/20/2021 20:43   DG Abd Portable 1V  Result Date: 02/20/2021 CLINICAL DATA:  Oral gastric tube placement. EXAM: PORTABLE ABDOMEN - 1 VIEW COMPARISON:  Abdominal radiograph dated 02/20/2021. FINDINGS: An enteric tube has been retracted with the tip at the gastroesophageal junction and the side port in the mid to distal esophagus. IMPRESSION: Enteric tube with tip near the gastroesophageal junction. Electronically Signed   By: Zerita Boers M.D.   On: 02/20/2021 15:47    Anti-infectives: Anti-infectives (From admission, onward)    Start     Dose/Rate Route Frequency Ordered Stop   02/20/21 0300  piperacillin-tazobactam (ZOSYN) IVPB 3.375 g        3.375 g 12.5 mL/hr over 240 Minutes Intravenous Every 8 hours 02/19/21 1907     02/19/21 1900  ceFEPIme (MAXIPIME) 2 g in sodium chloride 0.9 % 100 mL IVPB  Status:  Discontinued        2 g 200 mL/hr over 30 Minutes Intravenous  Once 02/19/21 1857 02/19/21 1906   02/19/21 1900  metroNIDAZOLE (FLAGYL) IVPB 500 mg  Status:  Discontinued         500 mg 100 mL/hr over 60 Minutes Intravenous  Once 02/19/21 1857 02/19/21 1906   02/19/21 1815  piperacillin-tazobactam (ZOSYN) IVPB 3.375 g        3.375 g 100 mL/hr over 30 Minutes Intravenous  Once 02/19/21 1809 02/19/21 1944        Assessment/Plan Acute sigmoid diverticulitis with contained perforation and abscess -not drainable -cont zosyn and abx therapy -NGT replaced pm 9/5. continue NGT to LIWS today and NPO except for sips/chips Abd xray 9/5 with dilated SB - WBC 12.8 (13.9) - likely repeat CT scan in next 24 hours -hope for improvement without surgical intervention -will need c-scope in about 6 weeks after DC  FEN - NGT LIWS, NPO sips/chips, IVF VTE - heparin gtt ID - zosyn  A fib  AKI Hypokalemia HTN ETOH withdrawal   LOS: 2 days    Winferd Humphrey , [facility] 02/22/2021, 8:26 AM Please see Amion for pager number during day hours 7:00am-4:30pm or 7:00am -11:30am on weekends

## 2021-02-22 NOTE — Progress Notes (Signed)
Critical potassium of 2.6 on AM labs. Dr. Tonie Griffith notified and IV potassium ordered. 57mq IV X 6, first 10 mEq started.

## 2021-02-22 NOTE — Progress Notes (Signed)
ANTICOAGULATION CONSULT NOTE  Pharmacy Consult for heparin Indication: atrial fibrillation  Allergies  Allergen Reactions   Atorvastatin Other (See Comments)    myalgias    Patient Measurements: Height: 6' (182.9 cm) Weight: 79.4 kg (175 lb 0.7 oz) IBW/kg (Calculated) : 77.6 Heparin Dosing Weight: 79.4kg  Vital Signs: Temp: 98.6 F (37 C) (09/06 0000) Temp Source: Oral (09/05 1522) BP: 109/62 (09/06 0100) Pulse Rate: 75 (09/06 0100)  Labs: Recent Labs    02/19/21 1838 02/20/21 0549 02/20/21 1137 02/21/21 0119 02/21/21 1101 02/21/21 2038 02/22/21 0022  HGB  --  15.3  --  14.1 15.3  --  13.5  HCT  --  44.1  --  40.4 43.9  --  38.8*  PLT  --  261  --  266 311  --  278  APTT  --   --    < > 39* 56*  --  104*  LABPROT 14.6  --   --   --   --   --   --   INR 1.1  --   --   --   --   --   --   HEPARINUNFRC  --   --    < > 0.17* 0.27* 0.65 0.41  CREATININE  --  2.93*  --  2.19*  --   --  1.43*   < > = values in this interval not displayed.     Estimated Creatinine Clearance: 52.8 mL/min (A) (by C-G formula based on SCr of 1.43 mg/dL (H)).  Assessment: 70 y/o male admitted for sepsis from contained perforated diverticulitis with active ileus. On PTA Xarelto for Afib with last dose on 9/2 at 2000.   Heparin level 0.65 and 0.41 (both therapeutic), PTT 104 sec - appears that Xarelto no longer affecting anti-Xa level so will utilize these for further monitoring. No bleeding noted.  Goal of Therapy:  Heparin level 0.3-0.7 units/ml aPTT 66-102 seconds Monitor platelets by anticoagulation protocol: Yes   Plan:  Continue heparin at 1500 units/hr D/c PTT  F/u daily heparin level and CBC  Sherlon Handing, PharmD, BCPS Please see amion for complete clinical pharmacist phone list 02/22/2021, 2:51 AM

## 2021-02-22 NOTE — Progress Notes (Signed)
Pharmacy Antibiotic Note  Travis Conrad is a 70 y.o. male admitted on 02/19/2021 with  IAI .  Pharmacy was consulted on 02/19/21 for zosyn dosing for IAI/severe sepsis 2/2 diverticulitis- contained perforation and small abscess.  WBC down to 12.8k. Afebrile. Scr improving decreased to 1.43, CrCl 52.8 ml/min.  Blood cx 9/3: no growth to date.   Plan: Continue Zosyn 3.375g IV q8h (4 hour infusion). -Monitor renal function, clinical status, and antibiotic plan  Height: 6' (182.9 cm) Weight: 79.4 kg (175 lb 0.7 oz) IBW/kg (Calculated) : 77.6  Temp (24hrs), Avg:98 F (36.7 C), Min:97.2 F (36.2 C), Max:98.6 F (37 C)  Recent Labs  Lab 02/19/21 1653 02/19/21 1838 02/20/21 0549 02/20/21 0920 02/21/21 0119 02/21/21 1101 02/22/21 0022  WBC 21.8*  --  14.3*  --  11.7* 13.9* 12.8*  CREATININE 3.23*  --  2.93*  --  2.19*  --  1.43*  LATICACIDVEN  --  1.3  --  1.5  --   --   --      Estimated Creatinine Clearance: 52.8 mL/min (A) (by C-G formula based on SCr of 1.43 mg/dL (H)).    Allergies  Allergen Reactions   Atorvastatin Other (See Comments)    myalgias    Antimicrobials this admission: Zosyn 9/3 >>   Microbiology results: 9/3 BCx x2:  ngtd x 3 days  Thank you for allowing pharmacy to be a part of this patient's care.  Nicole Cella, RPh Clinical Pharmacist [phone] Please check AMION for all [facility] phone numbers After 10:00 PM, call [place] [phone]

## 2021-02-22 NOTE — Progress Notes (Addendum)
PROGRESS NOTE        PATIENT DETAILS Name: Travis Conrad Age: 70 y.o. Sex: male Date of Birth: January 03, 1951 Admit Date: 02/19/2021 Admitting Physician Etta Quill, DO PCP:Sun, [name], MD  Brief Narrative: Patient is a 70 y.o. male A. fib, HTN, HLD, prior diverticulitis, heavy EtOH use-who presented with lower abdominal pain/nausea/vomiting-on initial presentation to the ED found to have severe sepsis physiology with hypotension/AKI-felt to be due to perforated diverticulitis.  See below for further details.  Significant events: 9/3>> presented with lower abdominal pain/vomiting-sepsis from contained perforated diverticulitis.  Found to have very active ileus/PSBO from diverticulitis/bowel inflammation 9/4>> A. fib with RVR-not responding to Lopressor/Cardizem-BP soft-started amiodarone infusion-cardiology consulted.  Significant studies: 9/3>> CT abdomen/pelvis: Acute sigmoid diverticulitis-small extraluminal gas-fluid collection in the sigmoid mesocolon consistent with abscess.  Dilated proximal small bowel-likely ileus.  Antimicrobial therapy: Zosyn: 9/3>>  Microbiology data: 9/3>> COVID/influenza PCR: Negative 9/3>> blood culture: Negative  Procedures : None  Consults: General surgery Cardiology  DVT Prophylaxis : SCDs Start: 02/20/21 0002   Subjective: Nausea/burping overnight-NG tube replaced.  He is much more awake and alert today compared to the past few days.   Assessment/Plan: Severe sepsis due to diverticulitis with contained perforation and small abscess: Sepsis physiology continues to improve-abdominal exam remains benign today-continue Zosyn-General surgery planning for repeat CT scan soon.    Ileus: Likely sequelae of diverticulitis-NG tube reinserted overnight due to nausea/constant burping-continue to await bowel function.    AKI: Hemodynamically mediated-improving-avoid nephrotoxic agents.    Hypokalemia: Due to NG tube  suctioning/EtOH use-continue to replete and recheck.  Add KCl to IVF.   PAF with RVR: Provoked by sepsis/alcohol withdrawal-started on amiodarone infusion-seems to have converted to sinus rhythm.  Due to n.p.o. status-cardiology recommending to continue amiodarone infusion.  On IV heparin as Xarelto remains on hold due to possibility of patient requiring a laparotomy.      ?Alcohol withdrawal: Much more awake and alert-continue Ativan per CIWA protocol.  Last drink on 9/2.  Wife claims that he does not drink significant amount of alcohol-she thinks that the patient erred when answering questions because he was already confused from pain medications etc.   HTN: All antihypertensives on hold-as BP soft.    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  Diet: Diet Order             Diet NPO time specified Except for: Ice Chips, Sips with Meds, Other (See Comments)  Diet effective now                    Code Status: Full code  Family Communication: Spouse-Jimmi Koeneman-[phone] updated over the phone on 9/6  Disposition Plan: Status is: Inpatient  Remains inpatient appropriate because:Inpatient level of care appropriate due to severity of illness  Dispo: The patient is from: Home              Anticipated d/c is to: Home              Patient currently is not medically stable to d/c.   Difficult to place patient No   Barriers to Discharge: Perforated diverticulitis with abscess-on IV antibiotics-developing alcohol withdrawal symptoms/PAF with RVR.  Antimicrobial agents: Anti-infectives (From admission, onward)    Start     Dose/Rate Route Frequency Ordered Stop  02/20/21 0300  piperacillin-tazobactam (ZOSYN) IVPB 3.375 g        3.375 g 12.5 mL/hr over 240 Minutes Intravenous Every 8 hours 02/19/21 1907     02/19/21 1900  ceFEPIme (MAXIPIME) 2 g in sodium chloride 0.9 % 100 mL IVPB  Status:   Discontinued        2 g 200 mL/hr over 30 Minutes Intravenous  Once 02/19/21 1857 02/19/21 1906   02/19/21 1900  metroNIDAZOLE (FLAGYL) IVPB 500 mg  Status:  Discontinued        500 mg 100 mL/hr over 60 Minutes Intravenous  Once 02/19/21 1857 02/19/21 1906   02/19/21 1815  piperacillin-tazobactam (ZOSYN) IVPB 3.375 g        3.375 g 100 mL/hr over 30 Minutes Intravenous  Once 02/19/21 1809 02/19/21 1944        Time spent: 35 minutes-Greater than 50% of this time was spent in counseling, explanation of diagnosis, planning of further management, and coordination of care.  MEDICATIONS: Scheduled Meds:  folic acid  1 mg Per Tube Daily   midodrine  10 mg Oral TID WC   multivitamin with minerals  1 tablet Per Tube Daily   nicotine  21 mg Transdermal Daily   thiamine  100 mg Per Tube Daily   Or   thiamine  100 mg Intravenous Daily   Continuous Infusions:  sodium chloride Stopped (02/22/21 1119)   amiodarone 30 mg/hr (02/22/21 1211)   heparin 1,500 Units/hr (02/22/21 1211)   lactated ringers with kcl 100 mL/hr at 02/22/21 1211   piperacillin-tazobactam (ZOSYN)  IV 12.5 mL/hr at 02/22/21 1211   PRN Meds:.sodium chloride, acetaminophen **OR** acetaminophen, HYDROmorphone (DILAUDID) injection, LORazepam **OR** LORazepam, menthol-cetylpyridinium, ondansetron **OR** ondansetron (ZOFRAN) IV, phenol   PHYSICAL EXAM: Vital signs: Vitals:   02/22/21 1130 02/22/21 1203 02/22/21 1230 02/22/21 1300  BP: (!) 141/71 130/73 129/72 133/71  Pulse: 79 79 79 80  Resp: 20 (!) 21 (!) 21 18  Temp: 98.2 F (36.8 C)     TempSrc: Oral     SpO2: 99% 98% 99% 99%  Weight:      Height:       Filed Weights   02/19/21 1800 02/20/21 2000  Weight: 79.4 kg 79.4 kg   Body mass index is 23.74 kg/m.   Gen Exam:Alert awake-not in any distress HEENT:atraumatic, normocephalic Chest: B/L clear to auscultation anteriorly CVS:S1S2 regular Abdomen:soft non tender, non distended Extremities:no  edema Neurology: Non focal Skin: no rash   I have personally reviewed following labs and imaging studies  LABORATORY DATA: CBC: Recent Labs  Lab 02/19/21 1653 02/19/21 1838 02/20/21 0549 02/21/21 0119 02/21/21 1101 02/22/21 0022  WBC 21.8*  --  14.3* 11.7* 13.9* 12.8*  NEUTROABS  --  18.3*  --   --   --   --   HGB 16.0  --  15.3 14.1 15.3 13.5  HCT 44.8  --  44.1 40.4 43.9 38.8*  MCV 96.1  --  99.5 98.8 99.3 100.0  PLT 254  --  261 266 311 278     Basic Metabolic Panel: Recent Labs  Lab 02/19/21 1653 02/19/21 1838 02/20/21 0549 02/21/21 0119 02/22/21 0022  NA 131*  --  132* 136 133*  K 3.2*  --  3.4* 2.7* 2.6*  CL 88*  --  92* 93* 92*  CO2 25  --  '25 28 31  '$ GLUCOSE 137*  --  121* 114* 238*  BUN 38*  --  38* 37* 28*  CREATININE 3.23*  --  2.93* 2.19* 1.43*  CALCIUM 11.0*  --  9.6 9.2 8.3*  MG  --  2.0  --  1.8 2.1  PHOS  --  4.8*  --  3.4  --      GFR: Estimated Creatinine Clearance: 52.8 mL/min (A) (by C-G formula based on SCr of 1.43 mg/dL (H)).  Liver Function Tests: Recent Labs  Lab 02/19/21 1653 02/20/21 0549  AST 18 36  ALT 11 23  ALKPHOS 85 94  BILITOT 1.1 1.4*  PROT 7.6 6.7  ALBUMIN 3.9 2.7*    Recent Labs  Lab 02/19/21 1653  LIPASE 22    No results for input(s): AMMONIA in the last 168 hours.  Coagulation Profile: Recent Labs  Lab 02/19/21 1838  INR 1.1     Cardiac Enzymes: No results for input(s): CKTOTAL, CKMB, CKMBINDEX, TROPONINI in the last 168 hours.  BNP (last 3 results) No results for input(s): PROBNP in the last 8760 hours.  Lipid Profile: No results for input(s): CHOL, HDL, LDLCALC, TRIG, CHOLHDL, LDLDIRECT in the last 72 hours.  Thyroid Function Tests: No results for input(s): TSH, T4TOTAL, FREET4, T3FREE, THYROIDAB in the last 72 hours.  Anemia Panel: No results for input(s): VITAMINB12, FOLATE, FERRITIN, TIBC, IRON, RETICCTPCT in the last 72 hours.  Urine analysis:    Component Value Date/Time    COLORURINE BROWN (A) 02/19/2021 1645   APPEARANCEUR CLOUDY (A) 02/19/2021 1645   LABSPEC 1.026 02/19/2021 1645   PHURINE 5.5 02/19/2021 1645   GLUCOSEU NEGATIVE 02/19/2021 1645   HGBUR LARGE (A) 02/19/2021 1645   BILIRUBINUR SMALL (A) 02/19/2021 1645   KETONESUR NEGATIVE 02/19/2021 1645   PROTEINUR >300 (A) 02/19/2021 1645   UROBILINOGEN 1.0 12/29/2013 1735   NITRITE NEGATIVE 02/19/2021 1645   LEUKOCYTESUR NEGATIVE 02/19/2021 1645    Sepsis Labs: Lactic Acid, Venous    Component Value Date/Time   LATICACIDVEN 1.5 02/20/2021 0920    MICROBIOLOGY: Recent Results (from the past 240 hour(s))  Culture, blood (routine x 2)     Status: None (Preliminary result)   Collection Time: 02/19/21  6:07 PM   Specimen: BLOOD  Result Value Ref Range Status   Specimen Description   Final    BLOOD BLOOD LEFT FOREARM Performed at [facility], [address] [place], [place] [address]    Special Requests   Final    BOTTLES DRAWN AEROBIC AND ANAEROBIC Blood Culture adequate volume Performed at [facility], [address] [place], [place] [address]    Culture   Final    NO GROWTH 3 DAYS Performed at [facility], [place] [address], [place], [place] [address]    Report Status PENDING  Incomplete  Culture, blood (routine x 2)     Status: None (Preliminary result)   Collection Time: 02/19/21  6:12 PM   Specimen: BLOOD  Result Value Ref Range Status   Specimen Description   Final    BLOOD BLOOD RIGHT FOREARM Performed at [facility], [address] [place], [place] [address]    Special Requests   Final    BOTTLES DRAWN AEROBIC AND ANAEROBIC Blood Culture adequate volume Performed at [facility], [address] [place], [place] [address]    Culture   Final    NO GROWTH 3 DAYS Performed at [facility], [place] [address] [place], [place] [address]    Report Status PENDING  Incomplete  Resp Panel  by RT-PCR (Flu A&B, Covid)  Status: None   Collection Time: 02/19/21  6:38 PM   Specimen: Nasopharyngeal(NP) swabs in vial transport medium  Result Value Ref Range Status   SARS Coronavirus 2 by RT PCR NEGATIVE NEGATIVE Final    Comment: (NOTE) SARS-CoV-2 target nucleic acids are NOT DETECTED.  The SARS-CoV-2 RNA is generally detectable in upper respiratory specimens during the acute phase of infection. The lowest concentration of SARS-CoV-2 viral copies this assay can detect is 138 copies/mL. A negative result does not preclude SARS-Cov-2 infection and should not be used as the sole basis for treatment or other patient management decisions. A negative result may occur with  improper specimen collection/handling, submission of specimen other than nasopharyngeal swab, presence of viral mutation(s) within the areas targeted by this assay, and inadequate number of viral copies(<138 copies/mL). A negative result must be combined with clinical observations, patient history, and epidemiological information. The expected result is Negative.  Fact Sheet for Patients:  EntrepreneurPulse.com.au  Fact Sheet for Healthcare Providers:  IncredibleEmployment.be  This test is no t yet approved or cleared by the [place] FDA and  has been authorized for detection and/or diagnosis of SARS-CoV-2 by FDA under an Emergency Use Authorization (EUA). This EUA will remain  in effect (meaning this test can be used) for the duration of the COVID-19 declaration under Section 564(b)(1) of the Act, 21 U.S.C.section 360bbb-3(b)(1), unless the authorization is terminated  or revoked sooner.       Influenza A by PCR NEGATIVE NEGATIVE Final   Influenza B by PCR NEGATIVE NEGATIVE Final    Comment: (NOTE) The Xpert Xpress SARS-CoV-2/FLU/RSV plus assay is intended as an aid in the diagnosis of influenza from Nasopharyngeal swab specimens and should not be used as a  sole basis for treatment. Nasal washings and aspirates are unacceptable for Xpert Xpress SARS-CoV-2/FLU/RSV testing.  Fact Sheet for Patients: EntrepreneurPulse.com.au  Fact Sheet for Healthcare Providers: IncredibleEmployment.be  This test is not yet approved or cleared by the [place] FDA and has been authorized for detection and/or diagnosis of SARS-CoV-2 by FDA under an Emergency Use Authorization (EUA). This EUA will remain in effect (meaning this test can be used) for the duration of the COVID-19 declaration under Section 564(b)(1) of the Act, 21 U.S.C. section 360bbb-3(b)(1), unless the authorization is terminated or revoked.  Performed at [facility], [address] [place], [place] [address]     RADIOLOGY STUDIES/RESULTS: DG Abd 1 View  Result Date: 02/21/2021 CLINICAL DATA:  Abdominal pain. Diverticulitis of large intestine with perforation and abscess without bleeding. EXAM: ABDOMEN - 1 VIEW COMPARISON:  Radiographs yesterday.  CT 02/19/2021 FINDINGS: Divided supine views of the abdomen obtained. The enteric tube is not definitively seen on the current exam. There is gaseous gastric distension. There is progressive gaseous small bowel distension, measuring up to 5.1 cm in the left abdomen. Air throughout the colon. The extraluminal gas and fluid collection on prior CT is not confidently seen by radiograph. No large volume pneumoperitoneum on this supine exam. IMPRESSION: Gaseous gastric distension. Progressive gaseous small bowel distension, measuring up to 5.1 cm in the left abdomen. Query generalized ileus versus developing obstruction. Electronically Signed   By: Keith Rake M.D.   On: 02/21/2021 18:23   DG Abd Portable 1V  Result Date: 02/21/2021 CLINICAL DATA:  NG tube placement. EXAM: PORTABLE ABDOMEN - 1 VIEW COMPARISON:  Radiograph earlier today. FINDINGS: Tip of the enteric tube is below the diaphragm  in the stomach. The side-port is in the  region of the gastroesophageal junction. There is slight decreased gaseous gastric distension. Dilated small bowel in the upper abdomen is grossly similar. IMPRESSION: 1. Tip of the enteric tube below the diaphragm in the stomach, side-port in the region of the gastroesophageal junction. Consider advancement of 3-4 cm for optimal placement. Slight decreased gaseous gastric distension. 2. Dilated small bowel in the upper abdomen, similar to earlier today. Electronically Signed   By: Keith Rake M.D.   On: 02/21/2021 23:25   DG Abd Portable 1V  Result Date: 02/20/2021 CLINICAL DATA:  Nasogastric tube placement EXAM: PORTABLE ABDOMEN - 1 VIEW COMPARISON:  Portable exam 2000 hours compared to 1537 hours FINDINGS: Tip of nasogastric tube projects over stomach, though the proximal side-port likely remains in the distal esophagus; recommend advancing tube 3 cm. Lung bases clear. Dilated small bowel loops in upper abdomen. IMPRESSION: Recommend advancing nasogastric tube 3 cm. Electronically Signed   By: Lavonia Dana M.D.   On: 02/20/2021 20:43   DG Abd Portable 1V  Result Date: 02/20/2021 CLINICAL DATA:  Oral gastric tube placement. EXAM: PORTABLE ABDOMEN - 1 VIEW COMPARISON:  Abdominal radiograph dated 02/20/2021. FINDINGS: An enteric tube has been retracted with the tip at the gastroesophageal junction and the side port in the mid to distal esophagus. IMPRESSION: Enteric tube with tip near the gastroesophageal junction. Electronically Signed   By: Zerita Boers M.D.   On: 02/20/2021 15:47     LOS: 2 days   Oren Binet, MD  [facility]    To contact the attending provider between 7A-7P or the covering provider during after hours 7P-7A, please log into the web site [email or web address] and access using universal [place] password for that web site. If you do not have the password, please call the hospital operator.  02/22/2021, 3:00 PM

## 2021-02-22 NOTE — TOC CAGE-AID Note (Signed)
Transition of Care [facility]) - CAGE-AID Screening   Patient Details  Name: Travis Conrad MRN: [id number] Date of Birth: [birth year]  Transition of Care [facility]) CM/SW Contact:    Joanne Chars, LCSW Phone Number: 02/22/2021, 1:39 PM   Clinical Narrative:CSW came by room for Cage aid screening.  Pt wife present, pt not feeling well, will attempt again later.  Pt wife reports pt ETOH use has been "overblown" and that he is not an excessive drinker.      CAGE-AID Screening: Substance Abuse Screening unable to be completed due to: : Patient unable to participate

## 2021-02-22 NOTE — Progress Notes (Signed)
Progress Note  Patient Name: Travis Conrad Date of Encounter: 02/22/2021  [facility] Cardiologist: [name], MD   Subjective   No CP or dyspnea  Inpatient Medications    Scheduled Meds:  folic acid  1 mg Per Tube Daily   LORazepam  0-4 mg Intravenous Q4H   Followed by   Derrill Memo ON 02/23/2021] LORazepam  0-4 mg Intravenous Q8H   midodrine  10 mg Oral TID WC   multivitamin with minerals  1 tablet Per Tube Daily   nicotine  21 mg Transdermal Daily   thiamine  100 mg Per Tube Daily   Or   thiamine  100 mg Intravenous Daily   Continuous Infusions:  sodium chloride 10 mL/hr at 02/21/21 1807   amiodarone 30 mg/hr (02/22/21 0317)   heparin 1,500 Units/hr (02/22/21 0427)   lactated ringers with kcl     piperacillin-tazobactam (ZOSYN)  IV 3.375 g (02/22/21 0315)   potassium chloride 10 mEq (02/22/21 0744)   PRN Meds: sodium chloride, acetaminophen **OR** acetaminophen, HYDROmorphone (DILAUDID) injection, LORazepam **OR** LORazepam, menthol-cetylpyridinium, ondansetron **OR** ondansetron (ZOFRAN) IV, phenol   Vital Signs    Vitals:   02/22/21 0400 02/22/21 0500 02/22/21 0600 02/22/21 0700  BP: (!) 98/54 99/69 102/65 (!) 103/58  Pulse: 77 77 76 77  Resp: '16 20 15 15  '$ Temp:  97.9 F (36.6 C)  98.1 F (36.7 C)  TempSrc:  Axillary  Oral  SpO2: 98% 99% 99% 99%  Weight:      Height:        Intake/Output Summary (Last 24 hours) at 02/22/2021 0755 Last data filed at 02/22/2021 0600 Gross per 24 hour  Intake 4533.73 ml  Output 1535 ml  Net 2998.73 ml   Last 3 Weights 02/20/2021 02/19/2021 06/28/2020  Weight (lbs) 175 lb 0.7 oz 175 lb 0.7 oz 175 lb  Weight (kg) 79.4 kg 79.4 kg 79.379 kg      Telemetry    Atrial fibrillation converting to sinus; 3 beats NSVT - Personally Reviewed  Physical Exam   GEN: No acute distress.   Neck: No JVD Cardiac: RRR Respiratory: Clear to auscultation bilaterally. GI: Slightly distended, not tender to palpation. MS: No edema Neuro:   Nonfocal  Psych: Normal affect   Labs    Chemistry Recent Labs  Lab 02/19/21 1653 02/20/21 0549 02/21/21 0119 02/22/21 0022  NA 131* 132* 136 133*  K 3.2* 3.4* 2.7* 2.6*  CL 88* 92* 93* 92*  CO2 '25 25 28 31  '$ GLUCOSE 137* 121* 114* 238*  BUN 38* 38* 37* 28*  CREATININE 3.23* 2.93* 2.19* 1.43*  CALCIUM 11.0* 9.6 9.2 8.3*  PROT 7.6 6.7  --   --   ALBUMIN 3.9 2.7*  --   --   AST 18 36  --   --   ALT 11 23  --   --   ALKPHOS 85 94  --   --   BILITOT 1.1 1.4*  --   --   GFRNONAA 20* 22* 32* 53*  ANIONGAP 18* '15 15 10     '$ Hematology Recent Labs  Lab 02/21/21 0119 02/21/21 1101 02/22/21 0022  WBC 11.7* 13.9* 12.8*  RBC 4.09* 4.42 3.88*  HGB 14.1 15.3 13.5  HCT 40.4 43.9 38.8*  MCV 98.8 99.3 100.0  MCH 34.5* 34.6* 34.8*  MCHC 34.9 34.9 34.8  RDW 12.8 12.8 13.0  PLT 266 311 278     Radiology    DG Abd 1 View  Result  Date: 02/21/2021 CLINICAL DATA:  Abdominal pain. Diverticulitis of large intestine with perforation and abscess without bleeding. EXAM: ABDOMEN - 1 VIEW COMPARISON:  Radiographs yesterday.  CT 02/19/2021 FINDINGS: Divided supine views of the abdomen obtained. The enteric tube is not definitively seen on the current exam. There is gaseous gastric distension. There is progressive gaseous small bowel distension, measuring up to 5.1 cm in the left abdomen. Air throughout the colon. The extraluminal gas and fluid collection on prior CT is not confidently seen by radiograph. No large volume pneumoperitoneum on this supine exam. IMPRESSION: Gaseous gastric distension. Progressive gaseous small bowel distension, measuring up to 5.1 cm in the left abdomen. Query generalized ileus versus developing obstruction. Electronically Signed   By: Keith Rake M.D.   On: 02/21/2021 18:23   DG Abd Portable 1V  Result Date: 02/21/2021 CLINICAL DATA:  NG tube placement. EXAM: PORTABLE ABDOMEN - 1 VIEW COMPARISON:  Radiograph earlier today. FINDINGS: Tip of the enteric tube is  below the diaphragm in the stomach. The side-port is in the region of the gastroesophageal junction. There is slight decreased gaseous gastric distension. Dilated small bowel in the upper abdomen is grossly similar. IMPRESSION: 1. Tip of the enteric tube below the diaphragm in the stomach, side-port in the region of the gastroesophageal junction. Consider advancement of 3-4 cm for optimal placement. Slight decreased gaseous gastric distension. 2. Dilated small bowel in the upper abdomen, similar to earlier today. Electronically Signed   By: Keith Rake M.D.   On: 02/21/2021 23:25   DG Abd Portable 1V  Result Date: 02/20/2021 CLINICAL DATA:  Nasogastric tube placement EXAM: PORTABLE ABDOMEN - 1 VIEW COMPARISON:  Portable exam 2000 hours compared to 1537 hours FINDINGS: Tip of nasogastric tube projects over stomach, though the proximal side-port likely remains in the distal esophagus; recommend advancing tube 3 cm. Lung bases clear. Dilated small bowel loops in upper abdomen. IMPRESSION: Recommend advancing nasogastric tube 3 cm. Electronically Signed   By: Lavonia Dana M.D.   On: 02/20/2021 20:43   DG Abd Portable 1V  Result Date: 02/20/2021 CLINICAL DATA:  Oral gastric tube placement. EXAM: PORTABLE ABDOMEN - 1 VIEW COMPARISON:  Abdominal radiograph dated 02/20/2021. FINDINGS: An enteric tube has been retracted with the tip at the gastroesophageal junction and the side port in the mid to distal esophagus. IMPRESSION: Enteric tube with tip near the gastroesophageal junction. Electronically Signed   By: Zerita Boers M.D.   On: 02/20/2021 15:47    Patient Profile     70 y.o. male with past medical history of paroxysmal atrial fibrillation status post ablation, hypertension, hyperlipidemia admitted with perforated diverticula/abscess for evaluation of recurrent atrial fibrillation.  Assessment & Plan    1 paroxysmal atrial fibrillation-patient has converted to sinus rhythm.  We will continue IV  amiodarone while he is n.p.o. and transition to oral when tolerating.  Continue IV heparin.  We will arrange follow-up echocardiogram.  2 perforated diverticula with abscess-management per general surgery.  3 acute kidney injury-patient is being hydrated.  He is not volume overloaded on exam.  Continue to follow.  4 hypokalemia-supplement  5 alcohol withdrawal-Per primary service.  For questions or updates, please contact [place] Please consult [email or web address] for contact info under        Signed, Kirk Ruths, MD  02/22/2021, 7:55 AM

## 2021-02-23 ENCOUNTER — Inpatient Hospital Stay (HOSPITAL_COMMUNITY): Payer: Medicare Other

## 2021-02-23 DIAGNOSIS — I4891 Unspecified atrial fibrillation: Secondary | ICD-10-CM | POA: Diagnosis not present

## 2021-02-23 DIAGNOSIS — I482 Chronic atrial fibrillation, unspecified: Secondary | ICD-10-CM | POA: Diagnosis not present

## 2021-02-23 DIAGNOSIS — N179 Acute kidney failure, unspecified: Secondary | ICD-10-CM | POA: Diagnosis not present

## 2021-02-23 DIAGNOSIS — K5792 Diverticulitis of intestine, part unspecified, without perforation or abscess without bleeding: Secondary | ICD-10-CM | POA: Diagnosis not present

## 2021-02-23 DIAGNOSIS — K5720 Diverticulitis of large intestine with perforation and abscess without bleeding: Secondary | ICD-10-CM | POA: Diagnosis not present

## 2021-02-23 DIAGNOSIS — I480 Paroxysmal atrial fibrillation: Secondary | ICD-10-CM | POA: Diagnosis not present

## 2021-02-23 LAB — CBC
HCT: 39.5 % (ref 39.0–52.0)
Hemoglobin: 13.3 g/dL (ref 13.0–17.0)
MCH: 34.3 pg — ABNORMAL HIGH (ref 26.0–34.0)
MCHC: 33.7 g/dL (ref 30.0–36.0)
MCV: 101.8 fL — ABNORMAL HIGH (ref 80.0–100.0)
Platelets: 254 10*3/uL (ref 150–400)
RBC: 3.88 MIL/uL — ABNORMAL LOW (ref 4.22–5.81)
RDW: 13.2 % (ref 11.5–15.5)
WBC: 16.1 10*3/uL — ABNORMAL HIGH (ref 4.0–10.5)
nRBC: 0 % (ref 0.0–0.2)

## 2021-02-23 LAB — GLUCOSE, CAPILLARY
Glucose-Capillary: 103 mg/dL — ABNORMAL HIGH (ref 70–99)
Glucose-Capillary: 111 mg/dL — ABNORMAL HIGH (ref 70–99)
Glucose-Capillary: 85 mg/dL (ref 70–99)
Glucose-Capillary: 86 mg/dL (ref 70–99)
Glucose-Capillary: 89 mg/dL (ref 70–99)
Glucose-Capillary: 93 mg/dL (ref 70–99)

## 2021-02-23 LAB — COMPREHENSIVE METABOLIC PANEL
ALT: 13 U/L (ref 0–44)
AST: 15 U/L (ref 15–41)
Albumin: 2.2 g/dL — ABNORMAL LOW (ref 3.5–5.0)
Alkaline Phosphatase: 64 U/L (ref 38–126)
Anion gap: 8 (ref 5–15)
BUN: 19 mg/dL (ref 8–23)
CO2: 30 mmol/L (ref 22–32)
Calcium: 8.5 mg/dL — ABNORMAL LOW (ref 8.9–10.3)
Chloride: 98 mmol/L (ref 98–111)
Creatinine, Ser: 1.32 mg/dL — ABNORMAL HIGH (ref 0.61–1.24)
GFR, Estimated: 58 mL/min — ABNORMAL LOW (ref >60)
Glucose, Bld: 96 mg/dL (ref 70–99)
Potassium: 3.7 mmol/L (ref 3.5–5.1)
Sodium: 136 mmol/L (ref 135–145)
Total Bilirubin: 0.4 mg/dL (ref 0.3–1.2)
Total Protein: 5.4 g/dL — ABNORMAL LOW (ref 6.5–8.1)

## 2021-02-23 LAB — ECHOCARDIOGRAM COMPLETE
AR max vel: 2.56 cm2
AV Area VTI: 2.58 cm2
AV Area mean vel: 2.26 cm2
AV Mean grad: 3 mmHg
AV Peak grad: 3.9 mmHg
Ao pk vel: 0.99 m/s
Area-P 1/2: 5.37 cm2
Height: 72 in
S' Lateral: 2.8 cm
Single Plane A4C EF: 63.2 %
Weight: 2800.72 oz

## 2021-02-23 LAB — MAGNESIUM: Magnesium: 2 mg/dL (ref 1.7–2.4)

## 2021-02-23 LAB — HEPARIN LEVEL (UNFRACTIONATED): Heparin Unfractionated: 0.36 IU/mL (ref 0.30–0.70)

## 2021-02-23 LAB — PHOSPHORUS: Phosphorus: 2.5 mg/dL (ref 2.5–4.6)

## 2021-02-23 MED ORDER — IOHEXOL 9 MG/ML PO SOLN
ORAL | Status: AC
  Administered 2021-02-23: 500 mL
  Filled 2021-02-23: qty 1000

## 2021-02-23 NOTE — Progress Notes (Signed)
PROGRESS NOTE        PATIENT DETAILS Name: Travis Conrad Age: 70 y.o. Sex: male Date of Birth: 04/10/1951 Admit Date: 02/19/2021 Admitting Physician Etta Quill, DO PCP:Sun, [name], MD  Brief Narrative: Patient is a 70 y.o. male A. fib, HTN, HLD, prior diverticulitis, heavy EtOH use-who presented with lower abdominal pain/nausea/vomiting-on initial presentation to the ED found to have severe sepsis physiology with hypotension/AKI-felt to be due to perforated diverticulitis.  See below for further details.  Significant events: 9/3>> presented with lower abdominal pain/vomiting-sepsis from contained perforated diverticulitis.  Found to have very active ileus/PSBO from diverticulitis/bowel inflammation 9/4>> A. fib with RVR-not responding to Lopressor/Cardizem-BP soft-started amiodarone infusion-cardiology consulted.  Significant studies: 9/3>> CT abdomen/pelvis: Acute sigmoid diverticulitis-small extraluminal gas-fluid collection in the sigmoid mesocolon consistent with abscess.  Dilated proximal small bowel-likely ileus.  Antimicrobial therapy: Zosyn: 9/3>>  Microbiology data: 9/3>> COVID/influenza PCR: Negative 9/3>> blood culture: Negative  Procedures : None  Consults: General surgery Cardiology  DVT Prophylaxis : SCDs Start: 02/20/21 0002   Subjective: Lying comfortably in bed.  Claims to bepassing flatus-no BM yet.  Acknowledges almost daily alcohol intake but claims that he has cut back significantly over the past few months   (RN at bedside when I evaluated the patient this morning)  Assessment/Plan: Severe sepsis due to diverticulitis with contained perforation and small abscess: Sepsis physiology has resolved-continue Zosyn-abdominal exam remains benign-General surgery planning on repeating CT abdomen today.  Will await further recommendations.    Ileus: Likely sequelae of diverticulitis-NG tube decompression ongoing-reinserted  overnight due to nausea/constant burping-continue to await bowel function.    AKI: Hemodynamically mediated-improved-continue to follow electrolytes.     Hypokalemia: Due to NG tube suctioning/EtOH use-repleted-continue KCl with IVF.  Recheck electrolytes tomorrow morning.    PAF with RVR: RVR provoked by sepsis/alcohol withdrawal-currently in sinus rhythm-remains on amiodarone infusion until oral intake can be established.  On IV heparin-Xarelto on hold due to the small possibility that patient may require a laparotomy.  [name]      ?Alcohol withdrawal: Awake and alert this morning-last drink was on 9/2.  Patient acknowledged almost daily alcohol use-but apparently has cut back significantly-spouse who I talked to on 9/6-thinks that this issue was probably overblown-and patient really does not drink all that much.  In any event-given his acute illness-tachycardic-confusion-he was treated with Ativan per CIWA protocol.  His mental status is much better-and even if he did have alcohol withdrawal-it was mild-and he is almost out of the window for any further withdrawal symptoms.  HTN: All antihypertensives on hold-as BP soft.    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  Diet: Diet Order             Diet NPO time specified Except for: Ice Chips, Sips with Meds, Other (See Comments)  Diet effective now                    Code Status: Full code  Family Communication: Spouse-Jimmi [phone] updated over the phone on 9/6  Disposition Plan: Status is: Inpatient  Remains inpatient appropriate because:Inpatient level of care appropriate due to severity of illness  Dispo: The patient is from: Home              Anticipated d/c is to: Home  Patient currently is not medically stable to d/c.   Difficult to place patient No   Barriers to Discharge: Perforated diverticulitis with abscess-on  IV antibiotics-developing alcohol withdrawal symptoms/PAF with RVR.  Antimicrobial agents: Anti-infectives (From admission, onward)    Start     Dose/Rate Route Frequency Ordered Stop   02/20/21 0300  piperacillin-tazobactam (ZOSYN) IVPB 3.375 g        3.375 g 12.5 mL/hr over 240 Minutes Intravenous Every 8 hours 02/19/21 1907     02/19/21 1900  ceFEPIme (MAXIPIME) 2 g in sodium chloride 0.9 % 100 mL IVPB  Status:  Discontinued        2 g 200 mL/hr over 30 Minutes Intravenous  Once 02/19/21 1857 02/19/21 1906   02/19/21 1900  metroNIDAZOLE (FLAGYL) IVPB 500 mg  Status:  Discontinued        500 mg 100 mL/hr over 60 Minutes Intravenous  Once 02/19/21 1857 02/19/21 1906   02/19/21 1815  piperacillin-tazobactam (ZOSYN) IVPB 3.375 g        3.375 g 100 mL/hr over 30 Minutes Intravenous  Once 02/19/21 1809 02/19/21 1944        Time spent: 25 minutes-Greater than 50% of this time was spent in counseling, explanation of diagnosis, planning of further management, and coordination of care.  MEDICATIONS: Scheduled Meds:  folic acid  1 mg Per Tube Daily   midodrine  10 mg Oral TID WC   multivitamin with minerals  1 tablet Per Tube Daily   nicotine  21 mg Transdermal Daily   pantoprazole (PROTONIX) IV  40 mg Intravenous Q24H   thiamine  100 mg Per Tube Daily   Or   thiamine  100 mg Intravenous Daily   Continuous Infusions:  sodium chloride Stopped (02/23/21 0259)   amiodarone 30 mg/hr (02/23/21 0044)   heparin 1,500 Units/hr (02/23/21 1158)   lactated ringers with kcl 100 mL/hr at 02/23/21 0257   piperacillin-tazobactam (ZOSYN)  IV 3.375 g (02/23/21 1033)   PRN Meds:.sodium chloride, acetaminophen **OR** acetaminophen, HYDROmorphone (DILAUDID) injection, LORazepam **OR** LORazepam, menthol-cetylpyridinium, ondansetron **OR** ondansetron (ZOFRAN) IV, phenol   PHYSICAL EXAM: Vital signs: Vitals:   02/22/21 2100 02/23/21 0000 02/23/21 0720 02/23/21 1120  BP: 137/88 (!) 90/57 118/77  118/74  Pulse: 89 83 80 76  Resp: '18 16 18 20  '$ Temp: 98.1 F (36.7 C) 98 F (36.7 C) 98 F (36.7 C) 98.2 F (36.8 C)  TempSrc: Oral Oral Oral Oral  SpO2: 99% 99% 99% 100%  Weight:      Height:       Filed Weights   02/19/21 1800 02/20/21 2000  Weight: 79.4 kg 79.4 kg   Body mass index is 23.74 kg/m.   Gen Exam:Alert awake-not in any distress HEENT:atraumatic, normocephalic Chest: B/L clear to auscultation anteriorly CVS:S1S2 regular Abdomen:soft non tender, non distended Extremities:no edema Neurology: Non focal Skin: no rash   I have personally reviewed following labs and imaging studies  LABORATORY DATA: CBC: Recent Labs  Lab 02/19/21 1838 02/20/21 0549 02/21/21 0119 02/21/21 1101 02/22/21 0022 02/23/21 0120  WBC  --  14.3* 11.7* 13.9* 12.8* 16.1*  NEUTROABS 18.3*  --   --   --   --   --   HGB  --  15.3 14.1 15.3 13.5 13.3  HCT  --  44.1 40.4 43.9 38.8* 39.5  MCV  --  99.5 98.8 99.3 100.0 101.8*  PLT  --  261 266 311 278 254     Basic Metabolic Panel:  Recent Labs  Lab 02/19/21 1838 02/20/21 0549 02/21/21 0119 02/22/21 0022 02/22/21 1619 02/23/21 0120  NA  --  132* 136 133* 137 136  K  --  3.4* 2.7* 2.6* 3.4* 3.7  CL  --  92* 93* 92* 94* 98  CO2  --  '25 28 31 '$ 34* 30  GLUCOSE  --  121* 114* 238* 113* 96  BUN  --  38* 37* 28* 19 19  CREATININE  --  2.93* 2.19* 1.43* 1.36* 1.32*  CALCIUM  --  9.6 9.2 8.3* 8.5* 8.5*  MG 2.0  --  1.8 2.1  --  2.0  PHOS 4.8*  --  3.4  --   --  2.5     GFR: Estimated Creatinine Clearance: 57.2 mL/min (A) (by C-G formula based on SCr of 1.32 mg/dL (H)).  Liver Function Tests: Recent Labs  Lab 02/19/21 1653 02/20/21 0549 02/23/21 0120  AST 18 36 15  ALT '11 23 13  '$ ALKPHOS 85 94 64  BILITOT 1.1 1.4* 0.4  PROT 7.6 6.7 5.4*  ALBUMIN 3.9 2.7* 2.2*    Recent Labs  Lab 02/19/21 1653  LIPASE 22    No results for input(s): AMMONIA in the last 168 hours.  Coagulation Profile: Recent Labs  Lab  02/19/21 1838  INR 1.1     Cardiac Enzymes: No results for input(s): CKTOTAL, CKMB, CKMBINDEX, TROPONINI in the last 168 hours.  BNP (last 3 results) No results for input(s): PROBNP in the last 8760 hours.  Lipid Profile: No results for input(s): CHOL, HDL, LDLCALC, TRIG, CHOLHDL, LDLDIRECT in the last 72 hours.  Thyroid Function Tests: No results for input(s): TSH, T4TOTAL, FREET4, T3FREE, THYROIDAB in the last 72 hours.  Anemia Panel: No results for input(s): VITAMINB12, FOLATE, FERRITIN, TIBC, IRON, RETICCTPCT in the last 72 hours.  Urine analysis:    Component Value Date/Time   COLORURINE BROWN (A) 02/19/2021 1645   APPEARANCEUR CLOUDY (A) 02/19/2021 1645   LABSPEC 1.026 02/19/2021 1645   PHURINE 5.5 02/19/2021 1645   GLUCOSEU NEGATIVE 02/19/2021 1645   HGBUR LARGE (A) 02/19/2021 1645   BILIRUBINUR SMALL (A) 02/19/2021 1645   KETONESUR NEGATIVE 02/19/2021 1645   PROTEINUR >300 (A) 02/19/2021 1645   UROBILINOGEN 1.0 12/29/2013 1735   NITRITE NEGATIVE 02/19/2021 1645   LEUKOCYTESUR NEGATIVE 02/19/2021 1645    Sepsis Labs: Lactic Acid, Venous    Component Value Date/Time   LATICACIDVEN 1.5 02/20/2021 0920    MICROBIOLOGY: Recent Results (from the past 240 hour(s))  Culture, blood (routine x 2)     Status: None (Preliminary result)   Collection Time: 02/19/21  6:07 PM   Specimen: BLOOD  Result Value Ref Range Status   Specimen Description   Final    BLOOD BLOOD LEFT FOREARM Performed at [facility], [address], [place], [place] [address]    Special Requests   Final    BOTTLES DRAWN AEROBIC AND ANAEROBIC Blood Culture adequate volume Performed at [facility], [address], [place], [place] [address]    Culture   Final    NO GROWTH 4 DAYS Performed at [facility], [place] [address] [place],  [address]    Report Status PENDING  Incomplete  Culture, blood (routine x 2)     Status: None (Preliminary  result)   Collection Time: 02/19/21  6:12 PM   Specimen: BLOOD  Result Value Ref Range Status   Specimen Description   Final    BLOOD BLOOD  RIGHT FOREARM Performed at [facility], [address] [place], [place] [address]    Special Requests   Final    BOTTLES DRAWN AEROBIC AND ANAEROBIC Blood Culture adequate volume Performed at [facility], [address], [place], [place] [address]    Culture   Final    NO GROWTH 4 DAYS Performed at [facility], [place] [address], [place], [place] [address]    Report Status PENDING  Incomplete  Resp Panel by RT-PCR (Flu A&B, Covid)     Status: None   Collection Time: 02/19/21  6:38 PM   Specimen: Nasopharyngeal(NP) swabs in vial transport medium  Result Value Ref Range Status   SARS Coronavirus 2 by RT PCR NEGATIVE NEGATIVE Final    Comment: (NOTE) SARS-CoV-2 target nucleic acids are NOT DETECTED.  The SARS-CoV-2 RNA is generally detectable in upper respiratory specimens during the acute phase of infection. The lowest concentration of SARS-CoV-2 viral copies this assay can detect is 138 copies/mL. A negative result does not preclude SARS-Cov-2 infection and should not be used as the sole basis for treatment or other patient management decisions. A negative result may occur with  improper specimen collection/handling, submission of specimen other than nasopharyngeal swab, presence of viral mutation(s) within the areas targeted by this assay, and inadequate number of viral copies(<138 copies/mL). A negative result must be combined with clinical observations, patient history, and epidemiological information. The expected result is Negative.  Fact Sheet for Patients:  EntrepreneurPulse.com.au  Fact Sheet for Healthcare Providers:  IncredibleEmployment.be  This test is no t yet approved or cleared by the [place] FDA and  has been authorized for detection  and/or diagnosis of SARS-CoV-2 by FDA under an Emergency Use Authorization (EUA). This EUA will remain  in effect (meaning this test can be used) for the duration of the COVID-19 declaration under Section 564(b)(1) of the Act, 21 U.S.C.section 360bbb-3(b)(1), unless the authorization is terminated  or revoked sooner.       Influenza A by PCR NEGATIVE NEGATIVE Final   Influenza B by PCR NEGATIVE NEGATIVE Final    Comment: (NOTE) The Xpert Xpress SARS-CoV-2/FLU/RSV plus assay is intended as an aid in the diagnosis of influenza from Nasopharyngeal swab specimens and should not be used as a sole basis for treatment. Nasal washings and aspirates are unacceptable for Xpert Xpress SARS-CoV-2/FLU/RSV testing.  Fact Sheet for Patients: EntrepreneurPulse.com.au  Fact Sheet for Healthcare Providers: IncredibleEmployment.be  This test is not yet approved or cleared by the [place] FDA and has been authorized for detection and/or diagnosis of SARS-CoV-2 by FDA under an Emergency Use Authorization (EUA). This EUA will remain in effect (meaning this test can be used) for the duration of the COVID-19 declaration under Section 564(b)(1) of the Act, 21 U.S.C. section 360bbb-3(b)(1), unless the authorization is terminated or revoked.  Performed at [facility], [address], [place], [place] [address]     RADIOLOGY STUDIES/RESULTS: DG Abd 1 View  Result Date: 02/21/2021 CLINICAL DATA:  Abdominal pain. Diverticulitis of large intestine with perforation and abscess without bleeding. EXAM: ABDOMEN - 1 VIEW COMPARISON:  Radiographs yesterday.  CT 02/19/2021 FINDINGS: Divided supine views of the abdomen obtained. The enteric tube is not definitively seen on the current exam. There is gaseous gastric distension. There is progressive gaseous small bowel distension, measuring up to 5.1 cm in the left abdomen. Air throughout the colon. The  extraluminal gas and fluid collection on prior CT is not confidently seen  by radiograph. No large volume pneumoperitoneum on this supine exam. IMPRESSION: Gaseous gastric distension. Progressive gaseous small bowel distension, measuring up to 5.1 cm in the left abdomen. Query generalized ileus versus developing obstruction. Electronically Signed   By: Keith Rake M.D.   On: 02/21/2021 18:23   DG Abd Portable 1V  Result Date: 02/21/2021 CLINICAL DATA:  NG tube placement. EXAM: PORTABLE ABDOMEN - 1 VIEW COMPARISON:  Radiograph earlier today. FINDINGS: Tip of the enteric tube is below the diaphragm in the stomach. The side-port is in the region of the gastroesophageal junction. There is slight decreased gaseous gastric distension. Dilated small bowel in the upper abdomen is grossly similar. IMPRESSION: 1. Tip of the enteric tube below the diaphragm in the stomach, side-port in the region of the gastroesophageal junction. Consider advancement of 3-4 cm for optimal placement. Slight decreased gaseous gastric distension. 2. Dilated small bowel in the upper abdomen, similar to earlier today. Electronically Signed   By: Keith Rake M.D.   On: 02/21/2021 23:25     LOS: 3 days   Oren Binet, MD  [facility]    To contact the attending provider between 7A-7P or the covering provider during after hours 7P-7A, please log into the web site [email or web address] and access using universal [place] password for that web site. If you do not have the password, please call the hospital operator.  02/23/2021, 12:17 PM

## 2021-02-23 NOTE — Progress Notes (Signed)
ANTICOAGULATION CONSULT NOTE  Pharmacy Consult for heparin Indication: atrial fibrillation  Allergies  Allergen Reactions   Atorvastatin Other (See Comments)    myalgias    Patient Measurements: Height: 6' (182.9 cm) Weight: 79.4 kg (175 lb 0.7 oz) IBW/kg (Calculated) : 77.6 Heparin Dosing Weight: 79.4kg  Vital Signs: Temp: 98 F (36.7 C) (09/07 0720) Temp Source: Oral (09/07 0720) BP: 118/77 (09/07 0720) Pulse Rate: 80 (09/07 0720)  Labs: Recent Labs    02/21/21 1101 02/21/21 2038 02/22/21 0022 02/22/21 1619 02/23/21 0120  HGB 15.3  --  13.5  --  13.3  HCT 43.9  --  38.8*  --  39.5  PLT 311  --  278  --  254  APTT 56* 118* 104*  --   --   HEPARINUNFRC 0.27* 0.65 0.41  --  0.36  CREATININE  --   --  1.43* 1.36* 1.32*     Estimated Creatinine Clearance: 57.2 mL/min (A) (by C-G formula based on SCr of 1.32 mg/dL (H)).  Assessment: 70 y/o male admitted for sepsis from contained perforated diverticulitis with active ileus. On PTA Xarelto for Afib with last dose on 9/2 at 2000.   Heparin level 0.36, therapeutic on heparin infusion 1500 units/hr.  No bleeding reported. PTA Xarelto no longer affecting anti-Xa level so will utilize these for further monitoring.   Surgery hopeful for improvement without surgical intervention. Plans to repeat CT scan today to assess diverticulitis/abscess progression  Goal of Therapy:  Heparin level 0.3-0.7 units/ml Monitor platelets by anticoagulation protocol: Yes   Plan:  Continue heparin at 1500 units/hr F/u daily heparin level and CBC  Nicole Cella, RPh Clinical Pharmacist [phone] Please see amion for complete clinical pharmacist phone list 02/23/2021, 9:00 AM

## 2021-02-23 NOTE — Progress Notes (Addendum)
Subjective: Remains A&[name]. He complains mostly of heartburn which he states worsens throughout the day which are exacerbated by hiccups. He states he had one episode of emesis yesterday. He has a wet cough. He denies abdominal pain and is passing flatus. No BM for two days   Objective: Vital signs in last 24 hours: Temp:  [98 F (36.7 C)-99 F (37.2 C)] 98 F (36.7 C) (09/07 0720) Pulse Rate:  [72-92] 80 (09/07 0720) Resp:  [15-24] 18 (09/07 0720) BP: (90-150)/(57-89) 118/77 (09/07 0720) SpO2:  [98 %-100 %] 99 % (09/07 0720) Last BM Date: 02/22/21  Intake/Output from previous day: 09/06 0701 - 09/07 0700 In: 3401.2 [I.V.:2923.2; IV Piggyback:478] Out: 1200 [Urine:350; Emesis/NG output:850] Intake/Output this shift: No intake/output data recorded.  PE: Gen: NAD, resting in bed comfortably Heart: regular rate and rhythm this am Lungs: respiratory effort nonlabored on supplemental O2 via [place] Abd: soft, NT, +BS. Mildly distended. NGT with 100 ml dark bilious output MSK: all 4 extremities symmetric without cyanosis or edema. No calf TTP bilaterally   Lab Results:  Recent Labs    02/22/21 0022 02/23/21 0120  WBC 12.8* 16.1*  HGB 13.5 13.3  HCT 38.8* 39.5  PLT 278 254    BMET Recent Labs    02/22/21 1619 02/23/21 0120  NA 137 136  K 3.4* 3.7  CL 94* 98  CO2 34* 30  GLUCOSE 113* 96  BUN 19 19  CREATININE 1.36* 1.32*  CALCIUM 8.5* 8.5*    PT/INR No results for input(s): LABPROT, INR in the last 72 hours.  CMP     Component Value Date/Time   NA 136 02/23/2021 0120   NA 134 09/30/2019 0959   NA 142 03/03/2016 0921   K 3.7 02/23/2021 0120   K 4.3 03/03/2016 0921   CL 98 02/23/2021 0120   CO2 30 02/23/2021 0120   CO2 25 03/03/2016 0921   GLUCOSE 96 02/23/2021 0120   GLUCOSE 89 03/03/2016 0921   BUN 19 02/23/2021 0120   BUN 11 09/30/2019 0959   BUN 13.6 03/03/2016 0921   CREATININE 1.32 (H) 02/23/2021 0120   CREATININE 1.2 03/03/2016 0921    CALCIUM 8.5 (L) 02/23/2021 0120   CALCIUM 9.6 03/03/2016 0921   PROT 5.4 (L) 02/23/2021 0120   PROT 7.3 03/03/2016 0921   ALBUMIN 2.2 (L) 02/23/2021 0120   ALBUMIN 3.8 03/03/2016 0921   AST 15 02/23/2021 0120   AST 36 (H) 03/03/2016 0921   ALT 13 02/23/2021 0120   ALT 31 03/03/2016 0921   ALKPHOS 64 02/23/2021 0120   ALKPHOS 116 03/03/2016 0921   BILITOT 0.4 02/23/2021 0120   BILITOT 0.71 03/03/2016 0921   GFRNONAA 58 (L) 02/23/2021 0120   GFRNONAA 67 07/25/2013 1712   GFRAA >60 10/03/2019 0930   GFRAA 77 07/25/2013 1712   Lipase     Component Value Date/Time   LIPASE 22 02/19/2021 1653       Studies/Results: DG Abd 1 View  Result Date: 02/21/2021 CLINICAL DATA:  Abdominal pain. Diverticulitis of large intestine with perforation and abscess without bleeding. EXAM: ABDOMEN - 1 VIEW COMPARISON:  Radiographs yesterday.  CT 02/19/2021 FINDINGS: Divided supine views of the abdomen obtained. The enteric tube is not definitively seen on the current exam. There is gaseous gastric distension. There is progressive gaseous small bowel distension, measuring up to 5.1 cm in the left abdomen. Air throughout the colon. The extraluminal gas and fluid collection on prior CT is  not confidently seen by radiograph. No large volume pneumoperitoneum on this supine exam. IMPRESSION: Gaseous gastric distension. Progressive gaseous small bowel distension, measuring up to 5.1 cm in the left abdomen. Query generalized ileus versus developing obstruction. Electronically Signed   By: Keith Rake M.D.   On: 02/21/2021 18:23   DG Abd Portable 1V  Result Date: 02/21/2021 CLINICAL DATA:  NG tube placement. EXAM: PORTABLE ABDOMEN - 1 VIEW COMPARISON:  Radiograph earlier today. FINDINGS: Tip of the enteric tube is below the diaphragm in the stomach. The side-port is in the region of the gastroesophageal junction. There is slight decreased gaseous gastric distension. Dilated small bowel in the upper abdomen is  grossly similar. IMPRESSION: 1. Tip of the enteric tube below the diaphragm in the stomach, side-port in the region of the gastroesophageal junction. Consider advancement of 3-4 cm for optimal placement. Slight decreased gaseous gastric distension. 2. Dilated small bowel in the upper abdomen, similar to earlier today. Electronically Signed   By: Keith Rake M.D.   On: 02/21/2021 23:25    Anti-infectives: Anti-infectives (From admission, onward)    Start     Dose/Rate Route Frequency Ordered Stop   02/20/21 0300  piperacillin-tazobactam (ZOSYN) IVPB 3.375 g        3.375 g 12.5 mL/hr over 240 Minutes Intravenous Every 8 hours 02/19/21 1907     02/19/21 1900  ceFEPIme (MAXIPIME) 2 g in sodium chloride 0.9 % 100 mL IVPB  Status:  Discontinued        2 g 200 mL/hr over 30 Minutes Intravenous  Once 02/19/21 1857 02/19/21 1906   02/19/21 1900  metroNIDAZOLE (FLAGYL) IVPB 500 mg  Status:  Discontinued        500 mg 100 mL/hr over 60 Minutes Intravenous  Once 02/19/21 1857 02/19/21 1906   02/19/21 1815  piperacillin-tazobactam (ZOSYN) IVPB 3.375 g        3.375 g 100 mL/hr over 30 Minutes Intravenous  Once 02/19/21 1809 02/19/21 1944        Assessment/Plan Acute sigmoid diverticulitis with contained perforation and abscess -not drainable -cont zosyn and abx therapy -NGT replaced 9/5. Output over last 24 H 950 mL. Continue NGT to LIWS for now and NPO except for sips/chips Abd xray 9/5 with dilated SB - WBC up to 16.1 from (12.8) - mobilize - can clamp NGT to ambulate -hopeful for improvement without surgical intervention -will need c-scope in about 6 weeks after DC  Last CT 9/3 - Will get repeat CT scan today to assess diverticulitis/abscess progression  FEN - NGT LIWS, NPO sips/chips, IVF VTE - heparin gtt ID - zosyn  A fib AKI Hypokalemia HTN ETOH withdrawal   LOS: 3 days    Winferd Humphrey , [facility] 02/23/2021, 7:54 AM Please see Amion for pager  number during day hours 7:00am-4:30pm or 7:00am -11:30am on weekends

## 2021-02-23 NOTE — Progress Notes (Signed)
Progress Note  Patient Name: Travis Conrad Date of Encounter: 02/23/2021  [facility] Cardiologist: [name], MD   Subjective   Pt denies CP  or dyspnea  Inpatient Medications    Scheduled Meds:  folic acid  1 mg Per Tube Daily   midodrine  10 mg Oral TID WC   multivitamin with minerals  1 tablet Per Tube Daily   nicotine  21 mg Transdermal Daily   pantoprazole (PROTONIX) IV  40 mg Intravenous Q24H   thiamine  100 mg Per Tube Daily   Or   thiamine  100 mg Intravenous Daily   Continuous Infusions:  sodium chloride Stopped (02/23/21 0259)   amiodarone 30 mg/hr (02/23/21 0044)   heparin 1,500 Units/hr (02/23/21 0309)   lactated ringers with kcl 100 mL/hr at 02/23/21 0257   piperacillin-tazobactam (ZOSYN)  IV 3.375 g (02/23/21 1033)   PRN Meds: sodium chloride, acetaminophen **OR** acetaminophen, HYDROmorphone (DILAUDID) injection, LORazepam **OR** LORazepam, menthol-cetylpyridinium, ondansetron **OR** ondansetron (ZOFRAN) IV, phenol   Vital Signs    Vitals:   02/22/21 1830 02/22/21 2100 02/23/21 0000 02/23/21 0720  BP: 139/83 137/88 (!) 90/57 118/77  Pulse: 91 89 83 80  Resp: '18 18 16 18  '$ Temp:  98.1 F (36.7 C) 98 F (36.7 C) 98 F (36.7 C)  TempSrc:  Oral Oral Oral  SpO2: 99% 99% 99% 99%  Weight:      Height:        Intake/Output Summary (Last 24 hours) at 02/23/2021 1052 Last data filed at 02/23/2021 0309 Gross per 24 hour  Intake 3401.16 ml  Output 1200 ml  Net 2201.16 ml    Last 3 Weights 02/20/2021 02/19/2021 06/28/2020  Weight (lbs) 175 lb 0.7 oz 175 lb 0.7 oz 175 lb  Weight (kg) 79.4 kg 79.4 kg 79.379 kg      Telemetry    Sinus- Personally Reviewed  Physical Exam   GEN: NAD Neck: Supple Cardiac: RRR, no rub Respiratory: CTA GI: Slightly distended, NT MS: No edema Neuro:  Grossly intact Psych: Normal affect   Labs    Chemistry Recent Labs  Lab 02/19/21 1653 02/20/21 0549 02/21/21 0119 02/22/21 0022 02/22/21 1619  02/23/21 0120  NA 131* 132*   < > 133* 137 136  K 3.2* 3.4*   < > 2.6* 3.4* 3.7  CL 88* 92*   < > 92* 94* 98  CO2 25 25   < > 31 34* 30  GLUCOSE 137* 121*   < > 238* 113* 96  BUN 38* 38*   < > 28* 19 19  CREATININE 3.23* 2.93*   < > 1.43* 1.36* 1.32*  CALCIUM 11.0* 9.6   < > 8.3* 8.5* 8.5*  PROT 7.6 6.7  --   --   --  5.4*  ALBUMIN 3.9 2.7*  --   --   --  2.2*  AST 18 36  --   --   --  15  ALT 11 23  --   --   --  13  ALKPHOS 85 94  --   --   --  64  BILITOT 1.1 1.4*  --   --   --  0.4  GFRNONAA 20* 22*   < > 53* 56* 58*  ANIONGAP 18* 15   < > '10 9 8   '$ < > = values in this interval not displayed.      Hematology Recent Labs  Lab 02/21/21 1101 02/22/21 0022 02/23/21 0120  WBC 13.9* 12.8* 16.1*  RBC 4.42 3.88* 3.88*  HGB 15.3 13.5 13.3  HCT 43.9 38.8* 39.5  MCV 99.3 100.0 101.8*  MCH 34.6* 34.8* 34.3*  MCHC 34.9 34.8 33.7  RDW 12.8 13.0 13.2  PLT 311 278 254      Radiology    DG Abd 1 View  Result Date: 02/21/2021 CLINICAL DATA:  Abdominal pain. Diverticulitis of large intestine with perforation and abscess without bleeding. EXAM: ABDOMEN - 1 VIEW COMPARISON:  Radiographs yesterday.  CT 02/19/2021 FINDINGS: Divided supine views of the abdomen obtained. The enteric tube is not definitively seen on the current exam. There is gaseous gastric distension. There is progressive gaseous small bowel distension, measuring up to 5.1 cm in the left abdomen. Air throughout the colon. The extraluminal gas and fluid collection on prior CT is not confidently seen by radiograph. No large volume pneumoperitoneum on this supine exam. IMPRESSION: Gaseous gastric distension. Progressive gaseous small bowel distension, measuring up to 5.1 cm in the left abdomen. Query generalized ileus versus developing obstruction. Electronically Signed   By: Keith Rake M.D.   On: 02/21/2021 18:23   DG Abd Portable 1V  Result Date: 02/21/2021 CLINICAL DATA:  NG tube placement. EXAM: PORTABLE ABDOMEN - 1  VIEW COMPARISON:  Radiograph earlier today. FINDINGS: Tip of the enteric tube is below the diaphragm in the stomach. The side-port is in the region of the gastroesophageal junction. There is slight decreased gaseous gastric distension. Dilated small bowel in the upper abdomen is grossly similar. IMPRESSION: 1. Tip of the enteric tube below the diaphragm in the stomach, side-port in the region of the gastroesophageal junction. Consider advancement of 3-4 cm for optimal placement. Slight decreased gaseous gastric distension. 2. Dilated small bowel in the upper abdomen, similar to earlier today. Electronically Signed   By: Keith Rake M.D.   On: 02/21/2021 23:25    Patient Profile     70 y.o. male with past medical history of paroxysmal atrial fibrillation status post ablation, hypertension, hyperlipidemia admitted with perforated diverticula/abscess for evaluation of recurrent atrial fibrillation.  Assessment & Plan    1 paroxysmal atrial fibrillation-patient remains in sinus rhythm this morning.  Continue IV amiodarone for now.  Once he is tolerating oral medications will transition to 200 mg daily.  Continue IV heparin and once all procedures are complete we will transition back to Xarelto.  Await follow-up echocardiogram.  2 perforated diverticula with abscess-management per general surgery.  3 acute kidney injury-creatinine unchanged today.  We will continue to follow.  4 hypokalemia-resolved  5 alcohol withdrawal-Per primary service.  For questions or updates, please contact [place] Please consult [email or web address] for contact info under        Signed, Kirk Ruths, MD  02/23/2021, 10:52 AM

## 2021-02-24 ENCOUNTER — Inpatient Hospital Stay: Payer: Self-pay

## 2021-02-24 ENCOUNTER — Encounter (HOSPITAL_COMMUNITY): Payer: Self-pay | Admitting: Internal Medicine

## 2021-02-24 ENCOUNTER — Inpatient Hospital Stay (HOSPITAL_COMMUNITY): Payer: Medicare Other

## 2021-02-24 DIAGNOSIS — K5720 Diverticulitis of large intestine with perforation and abscess without bleeding: Secondary | ICD-10-CM | POA: Diagnosis not present

## 2021-02-24 DIAGNOSIS — I480 Paroxysmal atrial fibrillation: Secondary | ICD-10-CM | POA: Diagnosis not present

## 2021-02-24 DIAGNOSIS — N179 Acute kidney failure, unspecified: Secondary | ICD-10-CM | POA: Diagnosis not present

## 2021-02-24 DIAGNOSIS — K5792 Diverticulitis of intestine, part unspecified, without perforation or abscess without bleeding: Secondary | ICD-10-CM | POA: Diagnosis not present

## 2021-02-24 DIAGNOSIS — I482 Chronic atrial fibrillation, unspecified: Secondary | ICD-10-CM | POA: Diagnosis not present

## 2021-02-24 LAB — CBC
HCT: 37.7 % — ABNORMAL LOW (ref 39.0–52.0)
Hemoglobin: 12.8 g/dL — ABNORMAL LOW (ref 13.0–17.0)
MCH: 34.9 pg — ABNORMAL HIGH (ref 26.0–34.0)
MCHC: 34 g/dL (ref 30.0–36.0)
MCV: 102.7 fL — ABNORMAL HIGH (ref 80.0–100.0)
Platelets: 286 10*3/uL (ref 150–400)
RBC: 3.67 MIL/uL — ABNORMAL LOW (ref 4.22–5.81)
RDW: 13.2 % (ref 11.5–15.5)
WBC: 14.6 10*3/uL — ABNORMAL HIGH (ref 4.0–10.5)
nRBC: 0 % (ref 0.0–0.2)

## 2021-02-24 LAB — HEPARIN LEVEL (UNFRACTIONATED): Heparin Unfractionated: 0.53 IU/mL (ref 0.30–0.70)

## 2021-02-24 LAB — GLUCOSE, CAPILLARY
Glucose-Capillary: 81 mg/dL (ref 70–99)
Glucose-Capillary: 83 mg/dL (ref 70–99)
Glucose-Capillary: 85 mg/dL (ref 70–99)
Glucose-Capillary: 85 mg/dL (ref 70–99)
Glucose-Capillary: 86 mg/dL (ref 70–99)
Glucose-Capillary: 87 mg/dL (ref 70–99)
Glucose-Capillary: 94 mg/dL (ref 70–99)

## 2021-02-24 LAB — CULTURE, BLOOD (ROUTINE X 2)
Culture: NO GROWTH
Culture: NO GROWTH
Special Requests: ADEQUATE
Special Requests: ADEQUATE

## 2021-02-24 LAB — BASIC METABOLIC PANEL
Anion gap: 12 (ref 5–15)
BUN: 15 mg/dL (ref 8–23)
CO2: 24 mmol/L (ref 22–32)
Calcium: 8.7 mg/dL — ABNORMAL LOW (ref 8.9–10.3)
Chloride: 98 mmol/L (ref 98–111)
Creatinine, Ser: 1.01 mg/dL (ref 0.61–1.24)
GFR, Estimated: >60 mL/min (ref >60)
Glucose, Bld: 80 mg/dL (ref 70–99)
Potassium: 4.6 mmol/L (ref 3.5–5.1)
Sodium: 134 mmol/L — ABNORMAL LOW (ref 135–145)

## 2021-02-24 LAB — MAGNESIUM: Magnesium: 2 mg/dL (ref 1.7–2.4)

## 2021-02-24 MED ORDER — MIDAZOLAM HCL 2 MG/2ML IJ SOLN
INTRAMUSCULAR | Status: DC | PRN
  Administered 2021-02-24: 1 mg via INTRAVENOUS

## 2021-02-24 MED ORDER — FENTANYL CITRATE (PF) 100 MCG/2ML IJ SOLN
INTRAMUSCULAR | Status: DC | PRN
  Administered 2021-02-24: 50 ug via INTRAVENOUS

## 2021-02-24 MED ORDER — FENTANYL CITRATE (PF) 100 MCG/2ML IJ SOLN
INTRAMUSCULAR | Status: DC | PRN
  Administered 2021-02-24: 25 ug via INTRAVENOUS

## 2021-02-24 MED ORDER — MIDAZOLAM HCL 2 MG/2ML IJ SOLN
INTRAMUSCULAR | Status: AC
  Filled 2021-02-24: qty 2

## 2021-02-24 MED ORDER — SODIUM CHLORIDE 0.9% FLUSH
10.0000 mL | Freq: Two times a day (BID) | INTRAVENOUS | Status: DC
  Administered 2021-02-24 – 2021-02-25 (×3): 10 mL

## 2021-02-24 MED ORDER — SODIUM CHLORIDE 0.9% FLUSH: 10.0000 mL | INTRAVENOUS | Status: DC | PRN

## 2021-02-24 MED ORDER — CHLORHEXIDINE GLUCONATE CLOTH 2 % EX PADS
6.0000 | MEDICATED_PAD | Freq: Every day | CUTANEOUS | Status: DC
  Administered 2021-02-25 – 2021-04-06 (×39): 6 via TOPICAL

## 2021-02-24 MED ORDER — MIDAZOLAM HCL 2 MG/2ML IJ SOLN
INTRAMUSCULAR | Status: DC | PRN
  Administered 2021-02-24: .5 mg via INTRAVENOUS

## 2021-02-24 MED ORDER — FUROSEMIDE 10 MG/ML IJ SOLN
20.0000 mg | Freq: Once | INTRAMUSCULAR | Status: AC
  Administered 2021-02-24: 20 mg via INTRAVENOUS
  Filled 2021-02-24: qty 2

## 2021-02-24 MED ORDER — FENTANYL CITRATE (PF) 100 MCG/2ML IJ SOLN
INTRAMUSCULAR | Status: AC
  Filled 2021-02-24: qty 2

## 2021-02-24 NOTE — Progress Notes (Signed)
[place] for heparin Indication: atrial fibrillation  Allergies  Allergen Reactions   Atorvastatin Other (See Comments)    myalgias    Patient Measurements: Height: 6' (182.9 cm) Weight: 79.4 kg (175 lb 0.7 oz) IBW/kg (Calculated) : 77.6 Heparin Dosing Weight: 79.4kg  Vital Signs: Temp: 99 F (37.2 C) (09/08 0817) Temp Source: Oral (09/08 0817) BP: 130/72 (09/08 0817) Pulse Rate: 71 (09/08 0817)  Labs: Recent Labs    02/21/21 1101 02/21/21 1101 02/21/21 2038 02/22/21 0022 02/22/21 1619 02/23/21 0120 02/24/21 0356  HGB 15.3  --   --  13.5  --  13.3 12.8*  HCT 43.9  --   --  38.8*  --  39.5 37.7*  PLT 311  --   --  278  --  254 286  APTT 56*  --  118* 104*  --   --   --   HEPARINUNFRC 0.27*  --  0.65 0.41  --  0.36 0.53  CREATININE  --    < >  --  1.43* 1.36* 1.32* 1.01   < > = values in this interval not displayed.     Estimated Creatinine Clearance: 74.7 mL/min (by C-G formula based on SCr of 1.01 mg/dL).  Assessment: 70 y/o male admitted for sepsis from contained perforated diverticulitis with active ileus. On PTA Xarelto for Afib with last dose on 9/2 at 2000.  On IV heparin-Xarelto on hold due to possibility that patient may require a laparotomy.        Heparin level remains therapeutic on heparin infusion 1500 units/hr.  No bleeding reported. PTA Xarelto no longer affecting anti-Xa level so will utilize these for further monitoring. Cardiologist noted may transtion back to Xarelto once all procedures complete.  Surgery hopeful for improvement without surgical intervention. Plans to repeat CT scan today to assess diverticulitis/abscess progression  Goal of Therapy:  Heparin level 0.3-0.7 units/ml Monitor platelets by anticoagulation protocol: Yes   Plan:  Continue heparin at 1500 units/hr F/u daily heparin level and CBC  Nicole Cella, RPh Clinical Pharmacist [phone] Please see amion for complete clinical  pharmacist phone list 02/24/2021, 9:49 AM

## 2021-02-24 NOTE — Progress Notes (Signed)
Peripherally Inserted Central Catheter Placement  The IV Nurse has discussed with the patient and/or persons authorized to consent for the patient, the purpose of this procedure and the potential benefits and risks involved with this procedure.  The benefits include less needle sticks, lab draws from the catheter, and the patient may be discharged home with the catheter. Risks include, but not limited to, infection, bleeding, blood clot (thrombus formation), and puncture of an artery; nerve damage and irregular heartbeat and possibility to perform a PICC exchange if needed/ordered by physician.  Alternatives to this procedure were also discussed.  Bard Power PICC patient education guide, fact sheet on infection prevention and patient information card has been provided to patient /or left at bedside.  Wife at bedside signed consent for PICC placement, pt drowsy and difficulty staying awake.  PICC Placement Documentation  PICC Triple Lumen [id number] PICC Right Basilic 43 cm 0 cm (Active)  Indication for Insertion or Continuance of Line Administration of hyperosmolar/irritating solutions (i.e. TPN, Vancomycin, etc.) 02/24/21 1633  Exposed Catheter (cm) 0 cm 02/24/21 1633  Site Assessment Clean;Dry;Intact 02/24/21 1633  Lumen #1 Status Flushed;Saline locked;Blood return noted 02/24/21 1633  Lumen #2 Status Flushed;Saline locked;Blood return noted 02/24/21 1633  Lumen #3 Status Flushed;Saline locked;Blood return noted 02/24/21 1633  Dressing Type Transparent 02/24/21 1633  Dressing Status Clean;Dry;Intact 02/24/21 1633  Antimicrobial disc in place? Yes 02/24/21 1633  Safety Lock Not Applicable [id number]  Line Care Connections checked and tightened 02/24/21 1633  Line Adjustment (NICU/IV Team Only) No 02/24/21 1633  Dressing Intervention New dressing 02/24/21 [id number]  Dressing Change Due 03/03/21 02/24/21 1633       Rolena Infante 02/24/2021, 4:34 PM

## 2021-02-24 NOTE — Sedation Documentation (Signed)
This RN assessed patients right forearm when amiodarone was infusing and noted the area to be very swollen, red and painful to the touch. Upon palpation area had a raised like area that felt like a rope and when questioned patient states " this area is very painful". This RN notified his floor RN and discontinued fluids from infusing to that site.

## 2021-02-24 NOTE — Progress Notes (Signed)
Initial Nutrition Assessment  DOCUMENTATION CODES:   Not applicable  INTERVENTION:  -TPN to be initiated and managed per Pharmacy  Once TPN is initiated, monitor magnesium, potassium, and phosphorus daily for at least 3 days, MD to replete as needed, as pt is at risk for refeeding syndrome given prolonged inadequate PO intake.  NUTRITION DIAGNOSIS:   Inadequate oral intake related to acute illness (diverticulitis) as evidenced by per patient/family report, NPO status.  GOAL:   Patient will meet greater than or equal to 90% of their needs  MONITOR:   I & O's, Weight trends, Labs  REASON FOR ASSESSMENT:   Consult New TPN/TNA  ASSESSMENT:   Pt with PMH significant for HTN, Afib, h/o diverticulitis, heavy EtOH use, and high cholesterol admitted with severe sepsis due to diverticulitis with contained perforation and small abscess  Pt unavailable at time of RD visit, though reporting lower abdominal pain and N/V per RN. Reviewed weight history; no significant weight changes noted (see below).   Pt found to have ileus, so NGT placed to suction (gastric tip verified per xray). 768m output yesterday, 5069mso far today  Consult received for initiation of TPN. Per Pharmacy, TPN labs to be ordered for tomorrow and TPN will be initiated tomorrow given order was placed after 1200 cutoff. Once TPN is initiated, monitor magnesium, potassium, and phosphorus daily for at least 3 days, MD to replete as needed, as pt is at risk for refeeding syndrome given prolonged inadequate PO intake.  UOP: 65051m24 hours  Labs: Na 134 (L) Medications:  Scheduled Meds:  fentaNYL       folic acid  1 mg Per Tube Daily   midazolam       multivitamin with minerals  1 tablet Per Tube Daily   nicotine  21 mg Transdermal Daily   pantoprazole (PROTONIX) IV  40 mg Intravenous Q24H   thiamine  100 mg Per Tube Daily   Or   thiamine  100 mg Intravenous Daily  Continuous Infusions:  sodium chloride Stopped  (02/23/21 0259)   amiodarone 30 mg/hr (02/24/21 0442)   heparin Stopped (02/24/21 0935)   lactated ringers with kcl 50 mL/hr at 02/24/21 0948   piperacillin-tazobactam (ZOSYN)  IV 3.375 g (02/24/21 1145)   NUTRITION - FOCUSED PHYSICAL EXAM: Unable to perform at this time. Will attempt at follow-up.   Diet Order:   Diet Order             Diet NPO time specified Except for: Ice Chips, Sips with Meds, Other (See Comments)  Diet effective now                   EDUCATION NEEDS:   No education needs have been identified at this time  Skin:  Skin Assessment: Reviewed RN Assessment  Last BM:  9/6  Height:   Ht Readings from Last 1 Encounters:  02/20/21 6' (1.829 m)    Weight:  Wt Readings from Last 10 Encounters:  02/20/21 79.4 kg  06/28/20 79.4 kg  04/06/20 78.7 kg  01/05/20 78 kg  11/03/19 78.7 kg  10/03/19 79.4 kg  08/20/19 78.9 kg  07/22/19 79.1 kg  05/02/19 79.4 kg  04/02/18 80.3 kg   BMI:  Body mass index is 23.74 kg/m.  Estimated Nutritional Needs:   Kcal:  2000-2200  Protein:  100-110 grams  Fluid:  >2L    AmaLarkin InaS, RD, LDN (she/her/hers) RD pager number and weekend/on-call pager number located in [place]

## 2021-02-24 NOTE — Care Management Important Message (Signed)
Important Message  Patient Details  Name: Travis Conrad MRN: [id number] Date of Birth: 1950-09-20   Medicare Important Message Given:  Yes     Orbie Pyo 02/24/2021, 3:02 PM

## 2021-02-24 NOTE — Procedures (Signed)
Vascular and Interventional Radiology Procedure Note  Patient: Travis Conrad DOB: [birth year] Medical Record Number: [id number] Note Date/Time: 02/24/21 3:10 PM   Performing Physician: Michaelle Birks, MD Assistant(s): None  Diagnosis: Perforated diverticulitis  Procedure: DRAINAGE CATHETER PLACEMENT OF PELVIC ABSCESS  Anesthesia: Conscious Sedation Complications: None Estimated Blood Loss: Minimal Specimens: Sent for Gram Stain, Aerobe Culture, and Anerobe Culture  Findings:  Successful CT-guided drain placement of 12 F catheter into LLQ abscess.  Plan:  - Flush drain with 10 mL Normal Saline every 12 hours. - Follow up drain evaluation / sinogram in 2 week(s).  See detailed procedure note with images in PACS. The patient tolerated the procedure well without incident or complication and was returned to Floor Bed in stable condition.    Michaelle Birks, MD [facility]   Pager. [place]

## 2021-02-24 NOTE — Progress Notes (Signed)
Subjective: Had some worsened abdominal pain and distension overnight and early this am. Discussed with nursing and his NGT suction was not functioning properly until she worked with it - at which time he had prompt output of 500 ml bilious output. He states pain and bloating have improved since then. No further emesis yesterday. No nausea currently.  Objective: Vital signs in last 24 hours: Temp:  [97.7 F (36.5 C)-98.5 F (36.9 C)] 98.1 F (36.7 C) (09/08 0400) Pulse Rate:  [62-84] 69 (09/07 2359) Resp:  [16-20] 16 (09/07 2359) BP: (116-142)/(65-74) 123/69 (09/07 2359) SpO2:  [98 %-100 %] 98 % (09/07 2359) Last BM Date: 02/22/21  Intake/Output from previous day: 09/07 0701 - 09/08 0700 In: 3414.2 [I.V.:3271.5; IV Piggyback:142.8] Out: 1400 [Urine:650; Emesis/NG output:750] Intake/Output this shift: No intake/output data recorded.  PE: Gen: NAD, resting in bed comfortably Heart: regular rate and rhythm  Lungs: respiratory effort nonlabored on supplemental O2 via [place] Abd: soft, +BS. Mildly distended and mild diffuse TTP without rebound or guarding. NGT cannister recently emptied MSK: all 4 extremities symmetric without cyanosis or edema. No calf TTP bilaterally   Lab Results:  Recent Labs    02/23/21 0120 02/24/21 0356  WBC 16.1* 14.6*  HGB 13.3 12.8*  HCT 39.5 37.7*  PLT 254 286    BMET Recent Labs    02/23/21 0120 02/24/21 0356  NA 136 134*  K 3.7 4.6  CL 98 98  CO2 30 24  GLUCOSE 96 80  BUN 19 15  CREATININE 1.32* 1.01  CALCIUM 8.5* 8.7*    PT/INR No results for input(s): LABPROT, INR in the last 72 hours.  CMP     Component Value Date/Time   NA 134 (L) 02/24/2021 0356   NA 134 09/30/2019 0959   NA 142 03/03/2016 0921   K 4.6 02/24/2021 0356   K 4.3 03/03/2016 0921   CL 98 02/24/2021 0356   CO2 24 02/24/2021 0356   CO2 25 03/03/2016 0921   GLUCOSE 80 02/24/2021 0356   GLUCOSE 89 03/03/2016 0921   BUN 15 02/24/2021 0356   BUN 11  09/30/2019 0959   BUN 13.6 03/03/2016 0921   CREATININE 1.01 02/24/2021 0356   CREATININE 1.2 03/03/2016 0921   CALCIUM 8.7 (L) 02/24/2021 0356   CALCIUM 9.6 03/03/2016 0921   PROT 5.4 (L) 02/23/2021 0120   PROT 7.3 03/03/2016 0921   ALBUMIN 2.2 (L) 02/23/2021 0120   ALBUMIN 3.8 03/03/2016 0921   AST 15 02/23/2021 0120   AST 36 (H) 03/03/2016 0921   ALT 13 02/23/2021 0120   ALT 31 03/03/2016 0921   ALKPHOS 64 02/23/2021 0120   ALKPHOS 116 03/03/2016 0921   BILITOT 0.4 02/23/2021 0120   BILITOT 0.71 03/03/2016 0921   GFRNONAA >60 02/24/2021 0356   GFRNONAA 67 07/25/2013 1712   GFRAA >60 10/03/2019 0930   GFRAA 77 07/25/2013 1712   Lipase     Component Value Date/Time   LIPASE 22 02/19/2021 1653       Studies/Results: CT ABDOMEN PELVIS WO CONTRAST  Result Date: 02/23/2021 CLINICAL DATA:  Abdominal abscess/infection suspected. Diverticulitis. EXAM: CT ABDOMEN AND PELVIS WITHOUT CONTRAST TECHNIQUE: Multidetector CT imaging of the abdomen and pelvis was performed following the standard protocol without IV contrast. COMPARISON:  CT abdomen and pelvis 02/19/2021. FINDINGS: Lower chest: There are new small bilateral pleural effusions, right greater than left. There is also new atelectasis in the bilateral lower lobes, right greater than left. Hepatobiliary:  There is questionable mild inflammatory stranding surrounding the gallbladder. No calcified gallstones are seen. There is no biliary ductal dilatation. The liver appears within normal limits. Pancreas: Unremarkable. No pancreatic ductal dilatation or surrounding inflammatory changes. Spleen: Normal in size without focal abnormality. 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 The right adrenal gland is within normal limits. There are no urinary tract calculi identified. There is no hydronephrosis. There is stable bilateral nonspecific  perinephric fat stranding. Bladder is decompressed. Stomach/Bowel: There is diffuse colonic diverticulosis. There is wall thickening and inflammation involving the sigmoid colon compatible with acute diverticulitis similar to the prior examination. Within the pelvis superior to the level of sigmoid colon diverticulitis there is an air-fluid collection with surrounding inflammation measuring 5.2 x 3.0 by 3.7 cm. This has increased in size in the interval. Findings are suspicious for abscess. This fluid collection abuts adjacent small bowel loops. Additionally there is wall thickening and inflammation involving diverticula in the ascending colon images 4/49 and 50 without evidence for perforation or abscess. The colon is nondilated. The appendix is within normal limits. Enteric tube tip is in the proximal stomach. Contrast distends the stomach. Duodenal diverticulum is present. Mid/proximal small bowel loops are dilated measuring up to 4.7 cm. There is no definite transition point, but distal small bowel loops are decompressed. Contrast is not enter mid or distal small bowel at this time. Vascular/Lymphatic: Aortic atherosclerosis. No enlarged abdominal or pelvic lymph nodes. Reproductive: Prostate is unremarkable. Other: There is new presacral edema. There is trace free fluid in the pelvis. There is no focal abdominal wall hernia. There is mild body wall edema which is new from the prior examination. Musculoskeletal: There is no acute fracture or dislocation. 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 IMPRESSION: 1. Acute sigmoid colon diverticulitis changes are again noted. There is an air-fluid collection with surrounding inflammation in the pelvis adjacent to the sigmoid colon worrisome for perforation with abscess formation. This collection has increased in size now measuring 5.2 x 3.0 x 3.7 cm. 2. Acute uncomplicated ascending colon  diverticulitis. 3. Dilated proximal small bowel may represent partial small bowel obstruction or ileus. 4. New small bilateral pleural effusions with bibasilar atelectasis. 5. New presacral edema and body wall edema. 6. Aortic Atherosclerosis (ICD10-I70.0). Electronically Signed   By: Ronney Asters M.D.   On: 02/23/2021 21:36   ECHOCARDIOGRAM COMPLETE  Result Date: 02/23/2021    ECHOCARDIOGRAM REPORT   Patient Name:   Travis Conrad Date of Exam: 02/23/2021 Medical Rec #:  [id number]        Height:       72.0 in Accession #:    [id number]       Weight:       175.0 lb Date of Birth:  1950-06-29       BSA:          2.013 m Patient Age:    70 years         BP:           142/72 mmHg Patient Gender: M                HR:           76 bpm. Exam Location:  Inpatient Procedure: 2D Echo, Cardiac Doppler and Color Doppler Indications:    a-fib  History:        Patient  has prior history of Echocardiogram examinations, most                 recent 10/03/2019. Arrythmias:Atrial Fibrillation; Risk                 Factors:Hypertension.  Sonographer:    MH Referring Phys: [place]  1. Left ventricular ejection fraction, by estimation, is 60 to 65%. The left ventricle has normal function. The left ventricle has no regional wall motion abnormalities. There is moderate left ventricular hypertrophy of the basal-septal segment. Left ventricular diastolic parameters were normal.  2. Right ventricular systolic function is normal. The right ventricular size is normal. Tricuspid regurgitation signal is inadequate for assessing PA pressure.  3. The mitral valve is normal in structure. Trivial mitral valve regurgitation. No evidence of mitral stenosis.  4. The aortic valve is normal in structure. Aortic valve regurgitation is not visualized. No aortic stenosis is present. FINDINGS  Left Ventricle: Left ventricular ejection fraction, by estimation, is 60 to 65%. The left ventricle has normal function. The left  ventricle has no regional wall motion abnormalities. The left ventricular internal cavity size was normal in size. There is  moderate left ventricular hypertrophy of the basal-septal segment. Left ventricular diastolic parameters were normal. Normal left ventricular filling pressure. Right Ventricle: The right ventricular size is normal. No increase in right ventricular wall thickness. Right ventricular systolic function is normal. Tricuspid regurgitation signal is inadequate for assessing PA pressure. Left Atrium: Left atrial size was normal in size. Right Atrium: Right atrial size was normal in size. Pericardium: There is no evidence of pericardial effusion. Mitral Valve: The mitral valve is normal in structure. Trivial mitral valve regurgitation. No evidence of mitral valve stenosis. Tricuspid Valve: The tricuspid valve is normal in structure. Tricuspid valve regurgitation is not demonstrated. No evidence of tricuspid stenosis. Aortic Valve: The aortic valve is normal in structure. Aortic valve regurgitation is not visualized. No aortic stenosis is present. Aortic valve mean gradient measures 3.0 mmHg. Aortic valve peak gradient measures 3.9 mmHg. Aortic valve area, by VTI measures 2.58 cm. Pulmonic Valve: The pulmonic valve was normal in structure. Pulmonic valve regurgitation is not visualized. No evidence of pulmonic stenosis. Aorta: The aortic root is normal in size and structure. Venous: The inferior vena cava was not well visualized. IAS/Shunts: No atrial level shunt detected by color flow Doppler.  LEFT VENTRICLE PLAX 2D LVIDd:         3.90 cm     Diastology LVIDs:         2.80 cm     LV e' medial:    8.81 cm/s LV PW:         1.00 cm     LV E/e' medial:  9.1 LV IVS:        1.50 cm     LV e' lateral:   9.03 cm/s LVOT diam:     2.00 cm     LV E/e' lateral: 8.9 LV SV:         62 LV SV Index:   31 LVOT Area:     3.14 cm  LV Volumes (MOD) LV vol d, MOD A4C: 54.3 ml LV vol s, MOD A4C: 20.0 ml LV SV MOD A4C:      54.3 ml RIGHT VENTRICLE RV S prime:     13.20 cm/s TAPSE (M-mode): 2.2 cm LEFT ATRIUM             Index  RIGHT ATRIUM           Index LA diam:        3.60 cm 1.79 cm/m  RA Area:     13.40 cm LA Vol (A2C):   37.0 ml 18.38 ml/m RA Volume:   29.60 ml  14.70 ml/m LA Vol (A4C):   41.9 ml 20.81 ml/m LA Biplane Vol: 41.3 ml 20.51 ml/m  AORTIC VALVE AV Area (Vmax):    2.56 cm AV Area (Vmean):   2.26 cm AV Area (VTI):     2.58 cm AV Vmax:           99.00 cm/s AV Vmean:          77.000 cm/s AV VTI:            0.240 m AV Peak Grad:      3.9 mmHg AV Mean Grad:      3.0 mmHg LVOT Vmax:         80.80 cm/s LVOT Vmean:        55.300 cm/s LVOT VTI:          0.197 m LVOT/AV VTI ratio: 0.82  AORTA Ao Root diam: 2.60 cm MITRAL VALVE MV Area (PHT): 5.37 cm    SHUNTS MV E velocity: 80.50 cm/s  Systemic VTI:  0.20 m MV A velocity: 50.90 cm/s  Systemic Diam: 2.00 cm MV E/A ratio:  1.58 Fransico Him MD Electronically signed by Fransico Him MD Signature Date/Time: 02/23/2021/1:39:47 PM    Final     Anti-infectives: Anti-infectives (From admission, onward)    Start     Dose/Rate Route Frequency Ordered Stop   02/20/21 0300  piperacillin-tazobactam (ZOSYN) IVPB 3.375 g        3.375 g 12.5 mL/hr over 240 Minutes Intravenous Every 8 hours 02/19/21 1907     02/19/21 1900  ceFEPIme (MAXIPIME) 2 g in sodium chloride 0.9 % 100 mL IVPB  Status:  Discontinued        2 g 200 mL/hr over 30 Minutes Intravenous  Once 02/19/21 1857 02/19/21 1906   02/19/21 1900  metroNIDAZOLE (FLAGYL) IVPB 500 mg  Status:  Discontinued        500 mg 100 mL/hr over 60 Minutes Intravenous  Once 02/19/21 1857 02/19/21 1906   02/19/21 1815  piperacillin-tazobactam (ZOSYN) IVPB 3.375 g        3.375 g 100 mL/hr over 30 Minutes Intravenous  Once 02/19/21 1809 02/19/21 1944        Assessment/Plan Acute sigmoid diverticulitis with contained perforation and abscess -repeat CT 9/7 with increased size of abscess, will ask IR to eval for possible  drain placement -cont abx therapy -NGT replaced 9/5. Output over last 24 H 750 mL with additional 500 ml out this am. Continue NGT to LIWS for now and NPO except for sips/chips Abd xray 9/5 with dilated SB - WBC 14.6 (16.1) - mobilize - can clamp NGT to ambulate -hopeful for improvement without surgical intervention -will need c-scope in about 6 weeks after discharge  FEN - NGT LIWS, NPO sips/chips, IVF VTE - heparin gtt ID - [email or web address]  A fib AKI Hypokalemia HTN ETOH withdrawal   LOS: 4 days    Winferd Humphrey , [facility] 02/24/2021, 8:10 AM Please see Amion for pager number during day hours 7:00am-4:30pm or 7:00am -11:30am on weekends

## 2021-02-24 NOTE — Progress Notes (Signed)
Progress Note  Patient Name: ASIA CONNLEY Date of Encounter: 02/24/2021  [facility] Cardiologist: [name], MD   Subjective   Pt denies CP  or dyspnea; minimal abdominal pain  Inpatient Medications    Scheduled Meds:  folic acid  1 mg Per Tube Daily   midodrine  10 mg Oral TID WC   multivitamin with minerals  1 tablet Per Tube Daily   nicotine  21 mg Transdermal Daily   pantoprazole (PROTONIX) IV  40 mg Intravenous Q24H   thiamine  100 mg Per Tube Daily   Or   thiamine  100 mg Intravenous Daily   Continuous Infusions:  sodium chloride Stopped (02/23/21 0259)   amiodarone 30 mg/hr (02/24/21 0442)   heparin 1,500 Units/hr (02/24/21 0447)   lactated ringers with kcl 100 mL/hr at 02/23/21 2358   piperacillin-tazobactam (ZOSYN)  IV 3.375 g (02/24/21 0449)   PRN Meds: sodium chloride, acetaminophen **OR** acetaminophen, HYDROmorphone (DILAUDID) injection, LORazepam **OR** LORazepam, menthol-cetylpyridinium, ondansetron **OR** ondansetron (ZOFRAN) IV, phenol   Vital Signs    Vitals:   02/23/21 2042 02/23/21 2359 02/24/21 0400 02/24/21 0817  BP: (!) 142/72 123/69  130/72  Pulse: 62 69  71  Resp: '16 16  17  '$ Temp: 97.7 F (36.5 C) 97.8 F (36.6 C) 98.1 F (36.7 C) 99 F (37.2 C)  TempSrc: Oral Oral Oral Oral  SpO2: 100% 98%    Weight:      Height:        Intake/Output Summary (Last 24 hours) at 02/24/2021 0842 Last data filed at 02/24/2021 0800 Gross per 24 hour  Intake 3414.23 ml  Output 1900 ml  Net 1514.23 ml    Last 3 Weights 02/20/2021 02/19/2021 06/28/2020  Weight (lbs) 175 lb 0.7 oz 175 lb 0.7 oz 175 lb  Weight (kg) 79.4 kg 79.4 kg 79.379 kg      Telemetry    Sinus- Personally Reviewed  Physical Exam   GEN: NAD WD Neck: No JVD Cardiac: RRR  Respiratory: CTA anteriorly GI: Slightly distended, Mild tenderness to palpation MS: No edema Neuro:  No focal findings Psych: Normal affect   Labs    Chemistry Recent Labs  Lab 02/19/21 1653  02/20/21 0549 02/21/21 0119 02/22/21 1619 02/23/21 0120 02/24/21 0356  NA 131* 132*   < > 137 136 134*  K 3.2* 3.4*   < > 3.4* 3.7 4.6  CL 88* 92*   < > 94* 98 98  CO2 25 25   < > 34* 30 24  GLUCOSE 137* 121*   < > 113* 96 80  BUN 38* 38*   < > '19 19 15  '$ CREATININE 3.23* 2.93*   < > 1.36* 1.32* 1.01  CALCIUM 11.0* 9.6   < > 8.5* 8.5* 8.7*  PROT 7.6 6.7  --   --  5.4*  --   ALBUMIN 3.9 2.7*  --   --  2.2*  --   AST 18 36  --   --  15  --   ALT 11 23  --   --  13  --   ALKPHOS 85 94  --   --  64  --   BILITOT 1.1 1.4*  --   --  0.4  --   GFRNONAA 20* 22*   < > 56* 58* >60  ANIONGAP 18* 15   < > '9 8 12   '$ < > = values in this interval not displayed.  Hematology Recent Labs  Lab 02/22/21 0022 02/23/21 0120 02/24/21 0356  WBC 12.8* 16.1* 14.6*  RBC 3.88* 3.88* 3.67*  HGB 13.5 13.3 12.8*  HCT 38.8* 39.5 37.7*  MCV 100.0 101.8* 102.7*  MCH 34.8* 34.3* 34.9*  MCHC 34.8 33.7 34.0  RDW 13.0 13.2 13.2  PLT 278 254 286      Radiology    CT ABDOMEN PELVIS WO CONTRAST  Result Date: 02/23/2021 CLINICAL DATA:  Abdominal abscess/infection suspected. Diverticulitis. EXAM: CT ABDOMEN AND PELVIS WITHOUT CONTRAST TECHNIQUE: Multidetector CT imaging of the abdomen and pelvis was performed following the standard protocol without IV contrast. COMPARISON:  CT abdomen and pelvis 02/19/2021. FINDINGS: Lower chest: There are new small bilateral pleural effusions, right greater than left. There is also new atelectasis in the bilateral lower lobes, right greater than left. Hepatobiliary: There is questionable mild inflammatory stranding surrounding the gallbladder. No calcified gallstones are seen. There is no biliary ductal dilatation. The liver appears within normal limits. Pancreas: Unremarkable. No pancreatic ductal dilatation or surrounding inflammatory changes. Spleen: Normal in size without focal abnormality. Adrenals/Urinary Tract: Small low-attenuation lesion in the left adrenal gland  measuring 9 mm appears unchanged favored as a myelolipoma. The right adrenal gland is within normal limits. There are no urinary tract calculi identified. There is no hydronephrosis. There is stable bilateral nonspecific perinephric fat stranding. Bladder is decompressed. Stomach/Bowel: There is diffuse colonic diverticulosis. There is wall thickening and inflammation involving the sigmoid colon compatible with acute diverticulitis similar to the prior examination. Within the pelvis superior to the level of sigmoid colon diverticulitis there is an air-fluid collection with surrounding inflammation measuring 5.2 x 3.0 by 3.7 cm. This has increased in size in the interval. Findings are suspicious for abscess. This fluid collection abuts adjacent small bowel loops. Additionally there is wall thickening and inflammation involving diverticula in the ascending colon images 4/49 and 50 without evidence for perforation or abscess. The colon is nondilated. The appendix is within normal limits. Enteric tube tip is in the proximal stomach. Contrast distends the stomach. Duodenal diverticulum is present. Mid/proximal small bowel loops are dilated measuring up to 4.7 cm. There is no definite transition point, but distal small bowel loops are decompressed. Contrast is not enter mid or distal small bowel at this time. Vascular/Lymphatic: Aortic atherosclerosis. No enlarged abdominal or pelvic lymph nodes. Reproductive: Prostate is unremarkable. Other: There is new presacral edema. There is trace free fluid in the pelvis. There is no focal abdominal wall hernia. There is mild body wall edema which is new from the prior examination. Musculoskeletal: There is no acute fracture or dislocation. 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 IMPRESSION: 1. Acute sigmoid colon diverticulitis changes are again noted. There is an air-fluid collection with surrounding  inflammation in the pelvis adjacent to the sigmoid colon worrisome for perforation with abscess formation. This collection has increased in size now measuring 5.2 x 3.0 x 3.7 cm. 2. Acute uncomplicated ascending colon diverticulitis. 3. Dilated proximal small bowel may represent partial small bowel obstruction or ileus. 4. New small bilateral pleural effusions with bibasilar atelectasis. 5. New presacral edema and body wall edema. 6. Aortic Atherosclerosis (ICD10-I70.0). Electronically Signed   By: Ronney Asters M.D.   On: 02/23/2021 21:36   ECHOCARDIOGRAM COMPLETE  Result Date: 02/23/2021    ECHOCARDIOGRAM REPORT   Patient Name:   DALIN AAGARD Date of Exam: 02/23/2021 Medical Rec #:  [id number]  Height:       72.0 in Accession #:    [id number]       Weight:       175.0 lb Date of Birth:  01-May-1951       BSA:          2.013 m Patient Age:    70 years         BP:           142/72 mmHg Patient Gender: M                HR:           76 bpm. Exam Location:  Inpatient Procedure: 2D Echo, Cardiac Doppler and Color Doppler Indications:    a-fib  History:        Patient has prior history of Echocardiogram examinations, most                 recent 10/03/2019. Arrythmias:Atrial Fibrillation; Risk                 Factors:Hypertension.  Sonographer:    MH Referring Phys: Sherman  1. Left ventricular ejection fraction, by estimation, is 60 to 65%. The left ventricle has normal function. The left ventricle has no regional wall motion abnormalities. There is moderate left ventricular hypertrophy of the basal-septal segment. Left ventricular diastolic parameters were normal.  2. Right ventricular systolic function is normal. The right ventricular size is normal. Tricuspid regurgitation signal is inadequate for assessing PA pressure.  3. The mitral valve is normal in structure. Trivial mitral valve regurgitation. No evidence of mitral stenosis.  4. The aortic valve is normal in structure. Aortic  valve regurgitation is not visualized. No aortic stenosis is present. FINDINGS  Left Ventricle: Left ventricular ejection fraction, by estimation, is 60 to 65%. The left ventricle has normal function. The left ventricle has no regional wall motion abnormalities. The left ventricular internal cavity size was normal in size. There is  moderate left ventricular hypertrophy of the basal-septal segment. Left ventricular diastolic parameters were normal. Normal left ventricular filling pressure. Right Ventricle: The right ventricular size is normal. No increase in right ventricular wall thickness. Right ventricular systolic function is normal. Tricuspid regurgitation signal is inadequate for assessing PA pressure. Left Atrium: Left atrial size was normal in size. Right Atrium: Right atrial size was normal in size. Pericardium: There is no evidence of pericardial effusion. Mitral Valve: The mitral valve is normal in structure. Trivial mitral valve regurgitation. No evidence of mitral valve stenosis. Tricuspid Valve: The tricuspid valve is normal in structure. Tricuspid valve regurgitation is not demonstrated. No evidence of tricuspid stenosis. Aortic Valve: The aortic valve is normal in structure. Aortic valve regurgitation is not visualized. No aortic stenosis is present. Aortic valve mean gradient measures 3.0 mmHg. Aortic valve peak gradient measures 3.9 mmHg. Aortic valve area, by VTI measures 2.58 cm. Pulmonic Valve: The pulmonic valve was normal in structure. Pulmonic valve regurgitation is not visualized. No evidence of pulmonic stenosis. Aorta: The aortic root is normal in size and structure. Venous: The inferior vena cava was not well visualized. IAS/Shunts: No atrial level shunt detected by color flow Doppler.  LEFT VENTRICLE PLAX 2D LVIDd:         3.90 cm     Diastology LVIDs:         2.80 cm     LV e' medial:    8.81 cm/s LV PW:  1.00 cm     LV E/e' medial:  9.1 LV IVS:        1.50 cm     LV e' lateral:    9.03 cm/s LVOT diam:     2.00 cm     LV E/e' lateral: 8.9 LV SV:         62 LV SV Index:   31 LVOT Area:     3.14 cm  LV Volumes (MOD) LV vol d, MOD A4C: 54.3 ml LV vol s, MOD A4C: 20.0 ml LV SV MOD A4C:     54.3 ml RIGHT VENTRICLE RV S prime:     13.20 cm/s TAPSE (M-mode): 2.2 cm LEFT ATRIUM             Index       RIGHT ATRIUM           Index LA diam:        3.60 cm 1.79 cm/m  RA Area:     13.40 cm LA Vol (A2C):   37.0 ml 18.38 ml/m RA Volume:   29.60 ml  14.70 ml/m LA Vol (A4C):   41.9 ml 20.81 ml/m LA Biplane Vol: 41.3 ml 20.51 ml/m  AORTIC VALVE AV Area (Vmax):    2.56 cm AV Area (Vmean):   2.26 cm AV Area (VTI):     2.58 cm AV Vmax:           99.00 cm/s AV Vmean:          77.000 cm/s AV VTI:            0.240 m AV Peak Grad:      3.9 mmHg AV Mean Grad:      3.0 mmHg LVOT Vmax:         80.80 cm/s LVOT Vmean:        55.300 cm/s LVOT VTI:          0.197 m LVOT/AV VTI ratio: 0.82  AORTA Ao Root diam: 2.60 cm MITRAL VALVE MV Area (PHT): 5.37 cm    SHUNTS MV E velocity: 80.50 cm/s  Systemic VTI:  0.20 m MV A velocity: 50.90 cm/s  Systemic Diam: 2.00 cm MV E/A ratio:  1.58 Fransico Him MD Electronically signed by Fransico Him MD Signature Date/Time: 02/23/2021/1:39:47 PM    Final     Patient Profile     70 y.o. male with past medical history of paroxysmal atrial fibrillation status post ablation, hypertension, hyperlipidemia admitted with perforated diverticula/abscess for evaluation of recurrent atrial fibrillation.  Assessment & Plan    1 paroxysmal atrial fibrillation-patient remains in sinus rhythm this morning.  Continue IV amiodarone for now.  Once he is tolerating oral medications will transition to 200 mg daily.  Continue IV heparin and once all procedures are complete we will transition back to Xarelto.  Echo shows normal LV function.  2 perforated diverticula with abscess-management per general surgery.  3 acute kidney injury-resolved.  Creatinine now normal.  4  hypokalemia-resolved  5 acute diastolic congestive heart failure-follow-up CT shows small bilateral pleural effusions.  Patient is +12 L since admission.  Decrease IV fluids to 50 cc/h and will give Lasix 20 mg IV x1.  Discontinue midodrine as blood pressure is normal.   For questions or updates, please contact [facility] Please consult [email or web address] for contact info under        Signed, Kirk Ruths, MD  02/24/2021, 8:42 AM

## 2021-02-24 NOTE — Progress Notes (Signed)
Pharmacy TPN Communication:  TPN Order placed after cutoff of 12 PM. Will order TPN labs for tomorrow and consult dietitian for nutritional recommendations with plans to order TPN tomorrow.   Albertina Parr, PharmD., BCPS, BCCCP Clinical Pharmacist Please refer to [facility] for unit-specific pharmacist

## 2021-02-24 NOTE — H&P (Signed)
Chief Complaint: Patient was seen in consultation today for aspiration and drain placement for intraabdominal fluid collection  Chief Complaint  Patient presents with   Abdominal Pain   at the request of Anice Paganini, M PA-C   Referring Physician(s): Loran Senters PA-C   Supervising Physician: Corrie Mckusick  Patient Status: [facility] - In-pt  History of Present Illness: Travis Conrad is a 70 y.o. male with PMHs of A. fib on Xarelto, high cholesterol, HTN, who presented to [facility] ED on 02/19/2021 due to abdominal pain was found to be septic secondary to acute perforated sigmoid diverticulitis.  Patient was admitted for further evaluation and management and general surgery was consulted recommended conservative treatment with IV antibiotics as the perforation appeared to be contained and patient did not have clinical signs of peritonitis. Patient underwent follow-up CT abdomen pelvis with contrast yesterday which showed enlargement of the air-fluid collection with surrounding inflammation in the pelvis adjacent to the sigmoid colon worrisome for perforation.  A percutaneous drain placement was recommended to the patient by general surgery.  After thorough discussion and shared decision making, patient decided to proceed with the drain placement.  IR was requested for aspiration and possible drain placement for the intra-abdominal/pelvic fluid collection.  Patient was seen in the patient's room. Patient laying in bed, not in acute distress.  RN at wife at bedside.  Reports persistent but not worsening abdominal pain and mild nausea and vomiting. Denise headache, fever, chills, shortness of breath, cough, chest pain, and bleeding.  Past Medical History:  Diagnosis Date   Atrial fibrillation ([place])    High cholesterol    "RX made groin hurt" (08/14/2016)   Hypertension     Past Surgical History:  Procedure Laterality Date   ANKLE FRACTURE SURGERY Left ~ 2012   "put rod and pins in"   ATRIAL  FIBRILLATION ABLATION N/A 10/03/2019   Procedure: ATRIAL FIBRILLATION ABLATION;  Surgeon: Constance Haw, MD;  Location: [place] [facility];  Service: Cardiovascular;  Laterality: N/A;   COLONOSCOPY W/ BIOPSIES AND POLYPECTOMY  ~ 2000   FRACTURE SURGERY     ORIF FIBULA FRACTURE  06/24/2012   Procedure: OPEN REDUCTION INTERNAL FIXATION (ORIF) FIBULA FRACTURE;  Surgeon: [name], MD;  Location: [place];  Service: Orthopedics;  Laterality: Left;  ORIF LEFT FIBULA,  POSSIBLE REPAIR OF SYNDESMOSIS    TEE WITHOUT CARDIOVERSION N/A 10/03/2019   Procedure: TRANSESOPHAGEAL ECHOCARDIOGRAM (TEE);  Surgeon: Constance Haw, MD;  Location: [place] [facility];  Service: Cardiovascular;  Laterality: N/A;    Allergies: Atorvastatin  Medications: Prior to Admission medications   Medication Sig Start Date End Date Taking? Authorizing Provider  calcium carbonate (TUMS - DOSED IN MG ELEMENTAL CALCIUM) 500 MG chewable tablet Chew 1 tablet by mouth 3 (three) times daily as needed for indigestion or heartburn.   Yes [provider]  diltiazem (DILT-XR) 240 MG 24 hr capsule Take 1 capsule by mouth daily with supper. Patient overdue for an appointment with Dr [name] and needs to call and schedule for further refills 1st attempt Patient taking differently: Take 240 mg by mouth every evening. 02/08/21  Yes Fay Records, MD  diphenhydrAMINE HCl, Sleep, (ZZZQUIL PO) Take 2 capsules by mouth at bedtime as needed (sleep).   Yes [provider]  linaCLOtide (LINZESS PO) Take 1 capsule by mouth daily.   Yes [provider]  losartan-hydrochlorothiazide (HYZAAR) 100-25 MG tablet Take 1 tablet by mouth daily. Please schedule overdue appointment for future  refills. 3rd and final attempt. Patient taking differently: Take 0.5 tablets by mouth daily. 10/26/20  Yes Fay Records, MD  rosuvastatin (CRESTOR) 20 MG tablet TAKE 1 TABLET(20 MG) BY MOUTH DAILY Patient taking differently: Take 20 mg by  mouth daily. 04/28/20  Yes Fay Records, MD  sotalol (BETAPACE) 120 MG tablet TAKE 1 TABLET(120 MG) BY MOUTH EVERY 12 HOURS Patient taking differently: Take 120 mg by mouth 2 (two) times daily. 08/11/20  Yes Fay Records, MD  XARELTO 20 MG TABS tablet TAKE 1 TABLET BY MOUTH EVERY DAY WITH DINNER Patient taking differently: Take 20 mg by mouth at bedtime. 09/10/20  Yes Camnitz, Will Hassell Done, MD  COVID-19 mRNA vaccine, Pfizer, 30 MCG/0.3ML injection INJECT AS DIRECTED 04/05/20 04/05/21  Carlyle Basques, MD  HYDROcodone-acetaminophen [facility]) 5-325 MG tablet Take 1-2 tablets by mouth every 6 (six) hours as needed. Patient not taking: No sig reported 06/28/20   Jacqlyn Larsen, PA-C     Family History  Problem Relation Age of Onset   Other Brother     Social History   Socioeconomic History   Marital status: Married    Spouse name: Not on file   Number of children: Not on file   Years of education: Not on file   Highest education level: Not on file  Occupational History   Not on file  Tobacco Use   Smoking status: Every Day    Packs/day: 1.00    Years: 50.00    Pack years: 50.00    Types: Cigarettes   Smokeless tobacco: Never  Vaping Use   Vaping Use: Never used  Substance and Sexual Activity   Alcohol use: Not Currently    Comment: 1/2 gallon of whiskey a week   Drug use: Yes    Types: Cocaine, Marijuana    Comment: 08/14/2016 "last cocaine was in early 1990s; smoked pot yesterday"   Sexual activity: Not Currently  Other Topics Concern   Not on file  Social History Narrative   Not on file   Social Determinants of Health   Financial Resource Strain: Not on file  Food Insecurity: Not on file  Transportation Needs: Not on file  Physical Activity: Not on file  Stress: Not on file  Social Connections: Not on file     Review of Systems: A 12 point ROS discussed and pertinent positives are indicated in the HPI above.  All other systems are negative.  Vital Signs: BP 130/72  (BP Location: Right Arm)   Pulse 71   Temp 99 F (37.2 C) (Oral)   Resp 17   Ht 6' (1.829 m)   Wt 175 lb 0.7 oz (79.4 kg)   SpO2 98%   BMI 23.74 kg/m    Physical Exam Vitals reviewed.  Constitutional:      General: He is not in acute distress.    Appearance: He is well-developed.     Comments: Lethargic   HENT:     Head: Normocephalic and atraumatic.     Comments: NG tube on left nares Cardiovascular:     Rate and Rhythm: Normal rate and regular rhythm.     Heart sounds: Normal heart sounds.  Pulmonary:     Effort: Pulmonary effort is normal.     Breath sounds: Normal breath sounds.  Abdominal:     General: Abdomen is flat. Bowel sounds are decreased.     Palpations: Abdomen is soft.  Skin:    General: Skin is warm and dry.  Coloration: Skin is not cyanotic or jaundiced.  Neurological:     Mental Status: He is alert and oriented to person, place, and time.  Psychiatric:        Mood and Affect: Mood normal.        Behavior: Behavior normal.    MD Evaluation Airway: WNL Heart: WNL Abdomen: WNL Chest/ Lungs: WNL ASA  Classification: 3 Mallampati/Airway Score: Two  Imaging: CT ABDOMEN PELVIS WO CONTRAST  Result Date: 02/23/2021 CLINICAL DATA:  Abdominal abscess/infection suspected. Diverticulitis. EXAM: CT ABDOMEN AND PELVIS WITHOUT CONTRAST TECHNIQUE: Multidetector CT imaging of the abdomen and pelvis was performed following the standard protocol without IV contrast. COMPARISON:  CT abdomen and pelvis 02/19/2021. FINDINGS: Lower chest: There are new small bilateral pleural effusions, right greater than left. There is also new atelectasis in the bilateral lower lobes, right greater than left. Hepatobiliary: There is questionable mild inflammatory stranding surrounding the gallbladder. No calcified gallstones are seen. There is no biliary ductal dilatation. The liver appears within normal limits. Pancreas: Unremarkable. No pancreatic ductal dilatation or surrounding  inflammatory changes. Spleen: Normal in size without focal abnormality. 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 The right adrenal gland is within normal limits. There are no urinary tract calculi identified. There is no hydronephrosis. There is stable bilateral nonspecific perinephric fat stranding. Bladder is decompressed. Stomach/Bowel: There is diffuse colonic diverticulosis. There is wall thickening and inflammation involving the sigmoid colon compatible with acute diverticulitis similar to the prior examination. Within the pelvis superior to the level of sigmoid colon diverticulitis there is an air-fluid collection with surrounding inflammation measuring 5.2 x 3.0 by 3.7 cm. This has increased in size in the interval. Findings are suspicious for abscess. This fluid collection abuts adjacent small bowel loops. Additionally there is wall thickening and inflammation involving diverticula in the ascending colon images 4/49 and 50 without evidence for perforation or abscess. The colon is nondilated. The appendix is within normal limits. Enteric tube tip is in the proximal stomach. Contrast distends the stomach. Duodenal diverticulum is present. Mid/proximal small bowel loops are dilated measuring up to 4.7 cm. There is no definite transition point, but distal small bowel loops are decompressed. Contrast is not enter mid or distal small bowel at this time. Vascular/Lymphatic: Aortic atherosclerosis. No enlarged abdominal or pelvic lymph nodes. Reproductive: Prostate is unremarkable. Other: There is new presacral edema. There is trace free fluid in the pelvis. There is no focal abdominal wall hernia. There is mild body wall edema which is new from the prior examination. Musculoskeletal: There is no acute fracture or dislocation. 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 IMPRESSION: 1. Acute sigmoid colon diverticulitis changes are again noted. There is an air-fluid collection with surrounding inflammation in the pelvis adjacent to the sigmoid colon worrisome for perforation with abscess formation. This collection has increased in size now measuring 5.2 x 3.0 x 3.7 cm. 2. Acute uncomplicated ascending colon diverticulitis. 3. Dilated proximal small bowel may represent partial small bowel obstruction or ileus. 4. New small bilateral pleural effusions with bibasilar atelectasis. 5. New presacral edema and body wall edema. 6. Aortic Atherosclerosis (ICD10-I70.0). Electronically Signed   By: Ronney Asters M.D.   On: 02/23/2021 21:36   CT Abdomen Pelvis Wo Contrast  Result Date: 02/19/2021 CLINICAL DATA:  Constipation, abdominal pain, not urinating much, decreased renal function on labs today, suspected intra-abdominal  infection/abscess, history atrial fibrillation, hypertension, smoker EXAM: CT ABDOMEN AND PELVIS WITHOUT CONTRAST TECHNIQUE: Multidetector CT imaging of the abdomen and pelvis was performed following the standard protocol without IV contrast. Sagittal and coronal MPR images reconstructed from axial data set. No oral contrast administered. 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 Adrenal glands, kidneys, and ureters otherwise normal appearance. Bladder decompressed. Stomach/Bowel: Normal appendix. Scattered colonic diverticulosis. Wall thickening at sigmoid colon with infiltration of pericolic fat consistent with acute diverticulitis. Small extraluminal gas and fluid collection identified consistent with perforation and abscess, collection measuring 3.5 x 1.5 x 2.7 cm. Small duodenal diverticulum. Dilated proximal and decompressed distal small bowel  loops, question ileus, no discrete transition zone identified. Stomach unremarkable. Vascular/Lymphatic: Atherosclerotic calcifications aorta and iliac arteries without aneurysm. Scattered pelvic phleboliths. Reproductive: Mild prostatic enlargement. Seminal vesicles unremarkable. Other: No free air or free fluid.  No hernia. Musculoskeletal: Bones demineralized. IMPRESSION: Acute sigmoid diverticulitis with a small extraluminal gas and fluid collection in the sigmoid mesocolon 3.5 x 1.5 x 2.7 cm consistent with perforation and abscess. Dilated proximal and decompressed distal small bowel loops, question ileus. Mild prostatic enlargement. 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 Aortic Atherosclerosis (ICD10-I70.0). Findings called to Dr.  Johnney Killian on 02/19/2021 at 1847 hours. Electronically Signed   By: Lavonia Dana M.D.   On: 02/19/2021 18:48   DG Abd 1 View  Result Date: 02/21/2021 CLINICAL DATA:  Abdominal pain. Diverticulitis of large intestine with perforation and abscess without bleeding. EXAM: ABDOMEN - 1 VIEW COMPARISON:  Radiographs yesterday.  CT 02/19/2021 FINDINGS: Divided supine views of the abdomen obtained. The enteric tube is not definitively seen on the current exam. There is gaseous gastric distension. There is progressive gaseous small bowel distension, measuring up to 5.1 cm in the left abdomen. Air throughout the colon. The extraluminal gas and fluid collection on prior CT is not confidently seen by radiograph. No large volume pneumoperitoneum on this supine exam. IMPRESSION: Gaseous gastric distension. Progressive gaseous small bowel distension, measuring up to 5.1 cm in the left abdomen. Query generalized ileus versus developing obstruction. Electronically Signed   By: Keith Rake M.D.   On: 02/21/2021 18:23   DG Chest Port 1 View  Result Date: 02/19/2021 CLINICAL DATA:  Abdominal pain and wheezing EXAM: PORTABLE CHEST 1 VIEW COMPARISON:  Portable exam 1824 hours compared to  06/28/2020 FINDINGS: Normal heart size, mediastinal contours, and pulmonary vascularity. Atherosclerotic calcification aorta. Lungs clear. No pulmonary infiltrate, pleural effusion, or pneumothorax. Osseous structures unremarkable. IMPRESSION: No acute abnormalities. Aortic Atherosclerosis (ICD10-I70.0). Electronically Signed   By: Lavonia Dana M.D.   On: 02/19/2021 18:49   DG Abd Portable 1V  Result Date: 02/21/2021 CLINICAL DATA:  NG tube placement. EXAM: PORTABLE ABDOMEN - 1 VIEW COMPARISON:  Radiograph earlier today. FINDINGS: Tip of the enteric tube is below the diaphragm in the stomach. The side-port is in the region of the gastroesophageal junction. There is slight decreased gaseous gastric distension. Dilated small bowel in the upper abdomen is grossly similar. IMPRESSION: 1. Tip of the enteric tube below the diaphragm in the stomach, side-port in the region of the gastroesophageal junction. Consider advancement of 3-4 cm for optimal placement. Slight decreased gaseous gastric distension. 2. Dilated small bowel in the upper abdomen, similar to earlier today. Electronically Signed   By: Keith Rake M.D.   On: 02/21/2021 23:25  DG Abd Portable 1V  Result Date: 02/20/2021 CLINICAL DATA:  Nasogastric tube placement EXAM: PORTABLE ABDOMEN - 1 VIEW COMPARISON:  Portable exam 2000 hours compared to 1537 hours FINDINGS: Tip of nasogastric tube projects over stomach, though the proximal side-port likely remains in the distal esophagus; recommend advancing tube 3 cm. Lung bases clear. Dilated small bowel loops in upper abdomen. IMPRESSION: Recommend advancing nasogastric tube 3 cm. Electronically Signed   By: Lavonia Dana M.D.   On: 02/20/2021 20:43   DG Abd Portable 1V  Result Date: 02/20/2021 CLINICAL DATA:  Oral gastric tube placement. EXAM: PORTABLE ABDOMEN - 1 VIEW COMPARISON:  Abdominal radiograph dated 02/20/2021. FINDINGS: An enteric tube has been retracted with the tip at the gastroesophageal  junction and the side port in the mid to distal esophagus. IMPRESSION: Enteric tube with tip near the gastroesophageal junction. Electronically Signed   By: Zerita Boers M.D.   On: 02/20/2021 15:47   DG Abd Portable 1 View  Result Date: 02/20/2021 CLINICAL DATA:  Nasogastric tube placement EXAM: PORTABLE ABDOMEN - 1 VIEW COMPARISON:  Abdominal CT from yesterday FINDINGS: The enteric tube tip reaches the stomach with side port at the GE junction. Dilated small bowel in the upper abdomen which is known from prior CT. IMPRESSION: Enteric tube with tip at the stomach and side port at the GE junction. Electronically Signed   By: Monte Fantasia M.D.   On: 02/20/2021 06:28   ECHOCARDIOGRAM COMPLETE  Result Date: 02/23/2021    ECHOCARDIOGRAM REPORT   Patient Name:   Travis Conrad Date of Exam: 02/23/2021 Medical Rec #:  [id number]        Height:       72.0 in Accession #:    [id number]       Weight:       175.0 lb Date of Birth:  1950/06/22       BSA:          2.013 m Patient Age:    89 years         BP:           142/72 mmHg Patient Gender: M                HR:           76 bpm. Exam Location:  Inpatient Procedure: 2D Echo, Cardiac Doppler and Color Doppler Indications:    a-fib  History:        Patient has prior history of Echocardiogram examinations, most                 recent 10/03/2019. Arrythmias:Atrial Fibrillation; Risk                 Factors:Hypertension.  Sonographer:    MH Referring Phys: [place]  1. Left ventricular ejection fraction, by estimation, is 60 to 65%. The left ventricle has normal function. The left ventricle has no regional wall motion abnormalities. There is moderate left ventricular hypertrophy of the basal-septal segment. Left ventricular diastolic parameters were normal.  2. Right ventricular systolic function is normal. The right ventricular size is normal. Tricuspid regurgitation signal is inadequate for assessing PA pressure.  3. The mitral valve is normal  in structure. Trivial mitral valve regurgitation. No evidence of mitral stenosis.  4. The aortic valve is normal in structure. Aortic valve regurgitation is not visualized. No aortic stenosis is present. FINDINGS  Left Ventricle: Left ventricular ejection fraction, by estimation, is 60 to  65%. The left ventricle has normal function. The left ventricle has no regional wall motion abnormalities. The left ventricular internal cavity size was normal in size. There is  moderate left ventricular hypertrophy of the basal-septal segment. Left ventricular diastolic parameters were normal. Normal left ventricular filling pressure. Right Ventricle: The right ventricular size is normal. No increase in right ventricular wall thickness. Right ventricular systolic function is normal. Tricuspid regurgitation signal is inadequate for assessing PA pressure. Left Atrium: Left atrial size was normal in size. Right Atrium: Right atrial size was normal in size. Pericardium: There is no evidence of pericardial effusion. Mitral Valve: The mitral valve is normal in structure. Trivial mitral valve regurgitation. No evidence of mitral valve stenosis. Tricuspid Valve: The tricuspid valve is normal in structure. Tricuspid valve regurgitation is not demonstrated. No evidence of tricuspid stenosis. Aortic Valve: The aortic valve is normal in structure. Aortic valve regurgitation is not visualized. No aortic stenosis is present. Aortic valve mean gradient measures 3.0 mmHg. Aortic valve peak gradient measures 3.9 mmHg. Aortic valve area, by VTI measures 2.58 cm. Pulmonic Valve: The pulmonic valve was normal in structure. Pulmonic valve regurgitation is not visualized. No evidence of pulmonic stenosis. Aorta: The aortic root is normal in size and structure. Venous: The inferior vena cava was not well visualized. IAS/Shunts: No atrial level shunt detected by color flow Doppler.  LEFT VENTRICLE PLAX 2D LVIDd:         3.90 cm     Diastology LVIDs:          2.80 cm     LV e' medial:    8.81 cm/s LV PW:         1.00 cm     LV E/e' medial:  9.1 LV IVS:        1.50 cm     LV e' lateral:   9.03 cm/s LVOT diam:     2.00 cm     LV E/e' lateral: 8.9 LV SV:         62 LV SV Index:   31 LVOT Area:     3.14 cm  LV Volumes (MOD) LV vol d, MOD A4C: 54.3 ml LV vol s, MOD A4C: 20.0 ml LV SV MOD A4C:     54.3 ml RIGHT VENTRICLE RV S prime:     13.20 cm/s TAPSE (M-mode): 2.2 cm LEFT ATRIUM             Index       RIGHT ATRIUM           Index LA diam:        3.60 cm 1.79 cm/m  RA Area:     13.40 cm LA Vol (A2C):   37.0 ml 18.38 ml/m RA Volume:   29.60 ml  14.70 ml/m LA Vol (A4C):   41.9 ml 20.81 ml/m LA Biplane Vol: 41.3 ml 20.51 ml/m  AORTIC VALVE AV Area (Vmax):    2.56 cm AV Area (Vmean):   2.26 cm AV Area (VTI):     2.58 cm AV Vmax:           99.00 cm/s AV Vmean:          77.000 cm/s AV VTI:            0.240 m AV Peak Grad:      3.9 mmHg AV Mean Grad:      3.0 mmHg LVOT Vmax:         80.80 cm/s LVOT Vmean:  55.300 cm/s LVOT VTI:          0.197 m LVOT/AV VTI ratio: 0.82  AORTA Ao Root diam: 2.60 cm MITRAL VALVE MV Area (PHT): 5.37 cm    SHUNTS MV E velocity: 80.50 cm/s  Systemic VTI:  0.20 m MV A velocity: 50.90 cm/s  Systemic Diam: 2.00 cm MV E/A ratio:  1.58 Fransico Him MD Electronically signed by Fransico Him MD Signature Date/Time: 02/23/2021/1:39:47 PM    Final     Labs:  CBC: Recent Labs    02/21/21 1101 02/22/21 0022 02/23/21 0120 02/24/21 0356  WBC 13.9* 12.8* 16.1* 14.6*  HGB 15.3 13.5 13.3 12.8*  HCT 43.9 38.8* 39.5 37.7*  PLT 311 278 254 286    COAGS: Recent Labs    02/19/21 1838 02/20/21 1137 02/21/21 0119 02/21/21 1101 02/21/21 2038 02/22/21 0022  INR 1.1  --   --   --   --   --   APTT  --    < > 39* 56* 118* 104*   < > = values in this interval not displayed.    BMP: Recent Labs    02/22/21 0022 02/22/21 1619 02/23/21 0120 02/24/21 0356  NA 133* 137 136 134*  K 2.6* 3.4* 3.7 4.6  CL 92* 94* 98 98  CO2 31 34*  30 24  GLUCOSE 238* 113* 96 80  BUN 28* '19 19 15  '$ CALCIUM 8.3* 8.5* 8.5* 8.7*  CREATININE 1.43* 1.36* 1.32* 1.01  GFRNONAA 53* 56* 58* >60    LIVER FUNCTION TESTS: Recent Labs    02/19/21 1653 02/20/21 0549 02/23/21 0120  BILITOT 1.1 1.4* 0.4  AST 18 36 15  ALT '11 23 13  '$ ALKPHOS 85 94 64  PROT 7.6 6.7 5.4*  ALBUMIN 3.9 2.7* 2.2*    TUMOR MARKERS: No results for input(s): AFPTM, CEA, CA199, CHROMGRNA in the last 8760 hours.  Assessment and Plan: 70 y.o. male with no previous abdominal medical history who presented to [facility] ED on 02/19/2021 was found to be septic secondary to acute perforated sigmoid diverticulitis.  Patient was started on IV antibiotic and hospitalized, general surgery was consulted for further evaluation and management of the perforated sigmoid diverticulitis. Patient underwent follow-up CT abdomen pelvis with contrast on 02/23/2021 which showed enlargement of air-fluid collection despite patient being on IV antibiotics. A percutaneous drain placement was recommended to the patient by general surgery, after thorough and shared decision making patient decided to proceed with the drain placement.  IR was requested for image guided aspiration and possible drain placement for intra-abdominal/pelvic fluid collection. N.p.o. since midnight VSS  CBC with leukocytosis 14.6, mild macrocytic anemia Hgb 12.8, PLT 286 INR 1.1 on 02/19/2021 On heparin infusion -stopped at 0935 today  Risks and benefits discussed with the patient including bleeding, infection, damage to adjacent structures, bowel perforation/fistula connection, and sepsis.  All of the patient's questions were answered, patient is agreeable to proceed. Consent signed and in chart.   Thank you for this interesting consult.  I greatly enjoyed meeting Travis Conrad and look forward to participating in their care.  A copy of this report was sent to the requesting provider on this date.  Electronically  Signed: Tera Mater, PA-C 02/24/2021, 10:25 AM   I spent a total of 30 min  in face to face in clinical consultation, greater than 50% of which was counseling/coordinating care for image guided aspiration and possible drain placement for intra-abdominal/pelvic fluid collection.

## 2021-02-24 NOTE — Progress Notes (Signed)
PROGRESS NOTE        PATIENT DETAILS Name: Travis Conrad Age: 70 y.o. Sex: male Date of Birth: 02/05/1951 Admit Date: 02/19/2021 Admitting Physician Etta Quill, DO PCP:Sun, [name], MD  Brief Narrative: Patient is a 70 y.o. male A. fib, HTN, HLD, prior diverticulitis, heavy EtOH use-who presented with lower abdominal pain/nausea/vomiting-on initial presentation to the ED found to have severe sepsis physiology with hypotension/AKI-felt to be due to perforated diverticulitis.  See below for further details.  Significant events: 9/3>> presented with lower abdominal pain/vomiting-sepsis from contained perforated diverticulitis.  Found to have very active ileus/PSBO from diverticulitis/bowel inflammation 9/4>> A. fib with RVR-not responding to Lopressor/Cardizem-BP soft-started amiodarone infusion-cardiology consulted. 9/7>> increase in size of pelvic abscess on repeat CT.  Significant studies: 9/3>> CT abdomen/pelvis: Acute sigmoid diverticulitis-small extraluminal gas-fluid collection in the sigmoid mesocolon consistent with abscess.  Dilated proximal small bowel-likely ileus. 9/7>> CT abdomen/pelvis: Abscess has increased in size 9/7>> Echo: EF 60-65%.  Antimicrobial therapy: Zosyn: 9/3>>  Microbiology data: 9/3>> COVID/influenza PCR: Negative 9/3>> blood culture: Negative  Procedures : None  Consults: General surgery Cardiology Interventional radiology  DVT Prophylaxis : SCDs Start: 02/20/21 0002   Subjective: Lying comfortably in bed-awake and alert.  No chest pain or shortness of breath.  Assessment/Plan: Severe sepsis due to diverticulitis with contained perforation and small abscess: Sepsis physiology has resolved-continue Zosyn-although abdominal exam remains benign-CT on 9/7 shows increasing size of pelvic abscess.  IR consulted for CT-guided drainage.  General surgery continues to follow.  Ileus: Likely sequelae of diverticulitis-NG  tube decompression ongoing-reinserted overnight due to nausea/constant burping-continue to await bowel function.    AKI: Hemodynamically mediated-improved-creatinine has now normalized.  Hypokalemia: Repleted and has normalized.  Continue to follow periodically.  PAF with RVR: RVR provoked by sepsis/alcohol withdrawal-currently in sinus rhythm-remains on amiodarone infusion until oral intake can be established.  On IV heparin-Xarelto on hold due to the small possibility that patient may require a laparotomy.  [name]      ?Alcohol withdrawal: Awake and alert this morning-last drink was on 9/2.  Patient acknowledged almost daily alcohol use-but apparently has cut back significantly-spouse who I talked to on 9/6-thinks that this issue was probably overblown-and patient really does not drink all that much.  In any event-given his acute illness-tachycardic-confusion-he was treated with Ativan per [facility] protocol.  His mental status is much better-and even if he did have alcohol withdrawal-it was mild-and he is almost out of the window for any further withdrawal symptoms.  HTN: All antihypertensives on hold-as BP soft.    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  Diet: Diet Order             Diet NPO time specified Except for: Ice Chips, Sips with Meds, Other (See Comments)  Diet effective now                    Code Status: Full code  Family Communication: Spouse-Jimmi [phone]-[phone] updated over the phone on 9/6  Disposition Plan: Status is: Inpatient  Remains inpatient appropriate because:Inpatient level of care appropriate due to severity of illness  Dispo: The patient is from: Home              Anticipated d/c is to: Home  Patient currently is not medically stable to d/c.   Difficult to place patient No   Barriers to Discharge: Perforated diverticulitis with abscess-on IV  antibiotics-developing alcohol withdrawal symptoms/PAF with RVR.  Antimicrobial agents: Anti-infectives (From admission, onward)    Start     Dose/Rate Route Frequency Ordered Stop   02/20/21 0300  piperacillin-tazobactam (ZOSYN) IVPB 3.375 g        3.375 g 12.5 mL/hr over 240 Minutes Intravenous Every 8 hours 02/19/21 1907     02/19/21 1900  ceFEPIme (MAXIPIME) 2 g in sodium chloride 0.9 % 100 mL IVPB  Status:  Discontinued        2 g 200 mL/hr over 30 Minutes Intravenous  Once 02/19/21 1857 02/19/21 1906   02/19/21 1900  metroNIDAZOLE (FLAGYL) IVPB 500 mg  Status:  Discontinued        500 mg 100 mL/hr over 60 Minutes Intravenous  Once 02/19/21 1857 02/19/21 1906   02/19/21 1815  piperacillin-tazobactam (ZOSYN) IVPB 3.375 g        3.375 g 100 mL/hr over 30 Minutes Intravenous  Once 02/19/21 1809 02/19/21 1944        Time spent: 25 minutes-Greater than 50% of this time was spent in counseling, explanation of diagnosis, planning of further management, and coordination of care.  MEDICATIONS: Scheduled Meds:  folic acid  1 mg Per Tube Daily   multivitamin with minerals  1 tablet Per Tube Daily   nicotine  21 mg Transdermal Daily   pantoprazole (PROTONIX) IV  40 mg Intravenous Q24H   thiamine  100 mg Per Tube Daily   Or   thiamine  100 mg Intravenous Daily   Continuous Infusions:  sodium chloride Stopped (02/23/21 0259)   amiodarone 30 mg/hr (02/24/21 0442)   heparin Stopped (02/24/21 0935)   lactated ringers with kcl 50 mL/hr at 02/24/21 0948   piperacillin-tazobactam (ZOSYN)  IV 3.375 g (02/24/21 1145)   PRN Meds:.sodium chloride, acetaminophen **OR** acetaminophen, HYDROmorphone (DILAUDID) injection, menthol-cetylpyridinium, ondansetron **OR** ondansetron (ZOFRAN) IV, phenol   PHYSICAL EXAM: Vital signs: Vitals:   02/23/21 2042 02/23/21 2359 02/24/21 0400 02/24/21 0817  BP: (!) 142/72 123/69  130/72  Pulse: 62 69  71  Resp: '16 16  17  '$ Temp: 97.7 F (36.5 C) 97.8  F (36.6 C) 98.1 F (36.7 C) 99 F (37.2 C)  TempSrc: Oral Oral Oral Oral  SpO2: 100% 98%    Weight:      Height:       Filed Weights   02/19/21 1800 02/20/21 2000  Weight: 79.4 kg 79.4 kg   Body mass index is 23.74 kg/m.   Gen Exam:Alert awake-not in any distress HEENT:atraumatic, normocephalic Chest: B/L clear to auscultation anteriorly CVS:S1S2 regular Abdomen:soft non tender, non distended Extremities:no edema Neurology: Non focal Skin: no rash    I have personally reviewed following labs and imaging studies  LABORATORY DATA: CBC: Recent Labs  Lab 02/19/21 1838 02/20/21 0549 02/21/21 0119 02/21/21 1101 02/22/21 0022 02/23/21 0120 02/24/21 0356  WBC  --    < > 11.7* 13.9* 12.8* 16.1* 14.6*  NEUTROABS 18.3*  --   --   --   --   --   --   HGB  --    < > 14.1 15.3 13.5 13.3 12.8*  HCT  --    < > 40.4 43.9 38.8* 39.5 37.7*  MCV  --    < > 98.8 99.3 100.0 101.8* 102.7*  PLT  --    < >  266 311 278 254 286   < > = values in this interval not displayed.     Basic Metabolic Panel: Recent Labs  Lab 02/19/21 1838 02/20/21 0549 02/21/21 0119 02/22/21 0022 02/22/21 1619 02/23/21 0120 02/24/21 0356  NA  --    < > 136 133* 137 136 134*  K  --    < > 2.7* 2.6* 3.4* 3.7 4.6  CL  --    < > 93* 92* 94* 98 98  CO2  --    < > 28 31 34* 30 24  GLUCOSE  --    < > 114* 238* 113* 96 80  BUN  --    < > 37* 28* '19 19 15  '$ CREATININE  --    < > 2.19* 1.43* 1.36* 1.32* 1.01  CALCIUM  --    < > 9.2 8.3* 8.5* 8.5* 8.7*  MG 2.0  --  1.8 2.1  --  2.0 2.0  PHOS 4.8*  --  3.4  --   --  2.5  --    < > = values in this interval not displayed.     GFR: Estimated Creatinine Clearance: 74.7 mL/min (by C-G formula based on SCr of 1.01 mg/dL).  Liver Function Tests: Recent Labs  Lab 02/19/21 1653 02/20/21 0549 02/23/21 0120  AST 18 36 15  ALT '11 23 13  '$ ALKPHOS 85 94 64  BILITOT 1.1 1.4* 0.4  PROT 7.6 6.7 5.4*  ALBUMIN 3.9 2.7* 2.2*    Recent Labs  Lab 02/19/21 1653   LIPASE 22    No results for input(s): AMMONIA in the last 168 hours.  Coagulation Profile: Recent Labs  Lab 02/19/21 1838  INR 1.1     Cardiac Enzymes: No results for input(s): CKTOTAL, CKMB, CKMBINDEX, TROPONINI in the last 168 hours.  BNP (last 3 results) No results for input(s): PROBNP in the last 8760 hours.  Lipid Profile: No results for input(s): CHOL, HDL, LDLCALC, TRIG, CHOLHDL, LDLDIRECT in the last 72 hours.  Thyroid Function Tests: No results for input(s): TSH, T4TOTAL, FREET4, T3FREE, THYROIDAB in the last 72 hours.  Anemia Panel: No results for input(s): VITAMINB12, FOLATE, FERRITIN, TIBC, IRON, RETICCTPCT in the last 72 hours.  Urine analysis:    Component Value Date/Time   COLORURINE BROWN (A) 02/19/2021 1645   APPEARANCEUR CLOUDY (A) 02/19/2021 1645   LABSPEC 1.026 02/19/2021 1645   PHURINE 5.5 02/19/2021 1645   GLUCOSEU NEGATIVE 02/19/2021 1645   HGBUR LARGE (A) 02/19/2021 1645   BILIRUBINUR SMALL (A) 02/19/2021 1645   KETONESUR NEGATIVE 02/19/2021 1645   PROTEINUR >300 (A) 02/19/2021 1645   UROBILINOGEN 1.0 12/29/2013 1735   NITRITE NEGATIVE 02/19/2021 1645   LEUKOCYTESUR NEGATIVE 02/19/2021 1645    Sepsis Labs: Lactic Acid, Venous    Component Value Date/Time   LATICACIDVEN 1.5 02/20/2021 0920    MICROBIOLOGY: Recent Results (from the past 240 hour(s))  Culture, blood (routine x 2)     Status: None   Collection Time: 02/19/21  6:07 PM   Specimen: BLOOD  Result Value Ref Range Status   Specimen Description   Final    BLOOD BLOOD LEFT FOREARM Performed at [facility], [address] [place], [place] [address]    Special Requests   Final    BOTTLES DRAWN AEROBIC AND ANAEROBIC Blood Culture adequate volume Performed at [facility], [address] [place], [place] [address]    Culture   Final    NO GROWTH  5 DAYS Performed at [facility], [place] [address] [place], [place] [address]     Report Status 02/24/2021 FINAL  Final  Culture, blood (routine x 2)     Status: None   Collection Time: 02/19/21  6:12 PM   Specimen: BLOOD  Result Value Ref Range Status   Specimen Description   Final    BLOOD BLOOD RIGHT FOREARM Performed at [facility], [address] [place], [place] [address]    Special Requests   Final    BOTTLES DRAWN AEROBIC AND ANAEROBIC Blood Culture adequate volume Performed at [facility], [address] [place], [place] [address]    Culture   Final    NO GROWTH 5 DAYS Performed at [facility], [place] [address] [place], [place] [address]    Report Status 02/24/2021 FINAL  Final  Resp Panel by RT-PCR (Flu A&B, Covid)     Status: None   Collection Time: 02/19/21  6:38 PM   Specimen: Nasopharyngeal(NP) swabs in vial transport medium  Result Value Ref Range Status   SARS Coronavirus 2 by RT PCR NEGATIVE NEGATIVE Final    Comment: (NOTE) SARS-CoV-2 target nucleic acids are NOT DETECTED.  The SARS-CoV-2 RNA is generally detectable in upper respiratory specimens during the acute phase of infection. The lowest concentration of SARS-CoV-2 viral copies this assay can detect is 138 copies/mL. A negative result does not preclude SARS-Cov-2 infection and should not be used as the sole basis for treatment or other patient management decisions. A negative result may occur with  improper specimen collection/handling, submission of specimen other than nasopharyngeal swab, presence of viral mutation(s) within the areas targeted by this assay, and inadequate number of viral copies(<138 copies/mL). A negative result must be combined with clinical observations, patient history, and epidemiological information. The expected result is Negative.  Fact Sheet for Patients:  EntrepreneurPulse.com.au  Fact Sheet for Healthcare Providers:  IncredibleEmployment.be  This test is no t yet  approved or cleared by the [place] FDA and  has been authorized for detection and/or diagnosis of SARS-CoV-2 by FDA under an Emergency Use Authorization (EUA). This EUA will remain  in effect (meaning this test can be used) for the duration of the COVID-19 declaration under Section 564(b)(1) of the Act, 21 U.S.C.section 360bbb-3(b)(1), unless the authorization is terminated  or revoked sooner.       Influenza A by PCR NEGATIVE NEGATIVE Final   Influenza B by PCR NEGATIVE NEGATIVE Final    Comment: (NOTE) The Xpert Xpress SARS-CoV-2/FLU/RSV plus assay is intended as an aid in the diagnosis of influenza from Nasopharyngeal swab specimens and should not be used as a sole basis for treatment. Nasal washings and aspirates are unacceptable for Xpert Xpress SARS-CoV-2/FLU/RSV testing.  Fact Sheet for Patients: EntrepreneurPulse.com.au  Fact Sheet for Healthcare Providers: IncredibleEmployment.be  This test is not yet approved or cleared by the [place] FDA and has been authorized for detection and/or diagnosis of SARS-CoV-2 by FDA under an Emergency Use Authorization (EUA). This EUA will remain in effect (meaning this test can be used) for the duration of the COVID-19 declaration under Section 564(b)(1) of the Act, 21 U.S.C. section 360bbb-3(b)(1), unless the authorization is terminated or revoked.  Performed at [facility], [address], [place], [place] [address]     RADIOLOGY STUDIES/RESULTS: CT ABDOMEN PELVIS WO CONTRAST  Result Date: 02/23/2021 CLINICAL DATA:  Abdominal abscess/infection suspected. Diverticulitis. EXAM: CT ABDOMEN AND PELVIS WITHOUT CONTRAST TECHNIQUE: Multidetector  CT imaging of the abdomen and pelvis was performed following the standard protocol without IV contrast. COMPARISON:  CT abdomen and pelvis 02/19/2021. FINDINGS: Lower chest: There are new small bilateral pleural effusions, right  greater than left. There is also new atelectasis in the bilateral lower lobes, right greater than left. Hepatobiliary: There is questionable mild inflammatory stranding surrounding the gallbladder. No calcified gallstones are seen. There is no biliary ductal dilatation. The liver appears within normal limits. Pancreas: Unremarkable. No pancreatic ductal dilatation or surrounding inflammatory changes. Spleen: Normal in size without focal abnormality. 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 The right adrenal gland is within normal limits. There are no urinary tract calculi identified. There is no hydronephrosis. There is stable bilateral nonspecific perinephric fat stranding. Bladder is decompressed. Stomach/Bowel: There is diffuse colonic diverticulosis. There is wall thickening and inflammation involving the sigmoid colon compatible with acute diverticulitis similar to the prior examination. Within the pelvis superior to the level of sigmoid colon diverticulitis there is an air-fluid collection with surrounding inflammation measuring 5.2 x 3.0 by 3.7 cm. This has increased in size in the interval. Findings are suspicious for abscess. This fluid collection abuts adjacent small bowel loops. Additionally there is wall thickening and inflammation involving diverticula in the ascending colon images 4/49 and 50 without evidence for perforation or abscess. The colon is nondilated. The appendix is within normal limits. Enteric tube tip is in the proximal stomach. Contrast distends the stomach. Duodenal diverticulum is present. Mid/proximal small bowel loops are dilated measuring up to 4.7 cm. There is no definite transition point, but distal small bowel loops are decompressed. Contrast is not enter mid or distal small bowel at this time. Vascular/Lymphatic: Aortic atherosclerosis. No enlarged abdominal or pelvic lymph nodes.  Reproductive: Prostate is unremarkable. Other: There is new presacral edema. There is trace free fluid in the pelvis. There is no focal abdominal wall hernia. There is mild body wall edema which is new from the prior examination. Musculoskeletal: There is no acute fracture or dislocation. 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 IMPRESSION: 1. Acute sigmoid colon diverticulitis changes are again noted. There is an air-fluid collection with surrounding inflammation in the pelvis adjacent to the sigmoid colon worrisome for perforation with abscess formation. This collection has increased in size now measuring 5.2 x 3.0 x 3.7 cm. 2. Acute uncomplicated ascending colon diverticulitis. 3. Dilated proximal small bowel may represent partial small bowel obstruction or ileus. 4. New small bilateral pleural effusions with bibasilar atelectasis. 5. New presacral edema and body wall edema. 6. Aortic Atherosclerosis (ICD10-I70.0). Electronically Signed   By: Ronney Asters M.D.   On: 02/23/2021 21:36   ECHOCARDIOGRAM COMPLETE  Result Date: 02/23/2021    ECHOCARDIOGRAM REPORT   Patient Name:   TOREN SASSER Date of Exam: 02/23/2021 Medical Rec #:  [id number]        Height:       72.0 in Accession #:    [id number]       Weight:       175.0 lb Date of Birth:  Sep 03, 1950       BSA:          2.013 m Patient Age:    65 years         BP:           142/72 mmHg Patient Gender: M  HR:           76 bpm. Exam Location:  Inpatient Procedure: 2D Echo, Cardiac Doppler and Color Doppler Indications:    a-fib  History:        Patient has prior history of Echocardiogram examinations, most                 recent 10/03/2019. Arrythmias:Atrial Fibrillation; Risk                 Factors:Hypertension.  Sonographer:    MH Referring Phys: [place]  1. Left ventricular ejection fraction, by estimation, is 60 to 65%. The left ventricle has  normal function. The left ventricle has no regional wall motion abnormalities. There is moderate left ventricular hypertrophy of the basal-septal segment. Left ventricular diastolic parameters were normal.  2. Right ventricular systolic function is normal. The right ventricular size is normal. Tricuspid regurgitation signal is inadequate for assessing PA pressure.  3. The mitral valve is normal in structure. Trivial mitral valve regurgitation. No evidence of mitral stenosis.  4. The aortic valve is normal in structure. Aortic valve regurgitation is not visualized. No aortic stenosis is present. FINDINGS  Left Ventricle: Left ventricular ejection fraction, by estimation, is 60 to 65%. The left ventricle has normal function. The left ventricle has no regional wall motion abnormalities. The left ventricular internal cavity size was normal in size. There is  moderate left ventricular hypertrophy of the basal-septal segment. Left ventricular diastolic parameters were normal. Normal left ventricular filling pressure. Right Ventricle: The right ventricular size is normal. No increase in right ventricular wall thickness. Right ventricular systolic function is normal. Tricuspid regurgitation signal is inadequate for assessing PA pressure. Left Atrium: Left atrial size was normal in size. Right Atrium: Right atrial size was normal in size. Pericardium: There is no evidence of pericardial effusion. Mitral Valve: The mitral valve is normal in structure. Trivial mitral valve regurgitation. No evidence of mitral valve stenosis. Tricuspid Valve: The tricuspid valve is normal in structure. Tricuspid valve regurgitation is not demonstrated. No evidence of tricuspid stenosis. Aortic Valve: The aortic valve is normal in structure. Aortic valve regurgitation is not visualized. No aortic stenosis is present. Aortic valve mean gradient measures 3.0 mmHg. Aortic valve peak gradient measures 3.9 mmHg. Aortic valve area, by VTI measures  2.58 cm. Pulmonic Valve: The pulmonic valve was normal in structure. Pulmonic valve regurgitation is not visualized. No evidence of pulmonic stenosis. Aorta: The aortic root is normal in size and structure. Venous: The inferior vena cava was not well visualized. IAS/Shunts: No atrial level shunt detected by color flow Doppler.  LEFT VENTRICLE PLAX 2D LVIDd:         3.90 cm     Diastology LVIDs:         2.80 cm     LV e' medial:    8.81 cm/s LV PW:         1.00 cm     LV E/e' medial:  9.1 LV IVS:        1.50 cm     LV e' lateral:   9.03 cm/s LVOT diam:     2.00 cm     LV E/e' lateral: 8.9 LV SV:         62 LV SV Index:   31 LVOT Area:     3.14 cm  LV Volumes (MOD) LV vol d, MOD A4C: 54.3 ml LV vol s, MOD A4C: 20.0 ml LV SV MOD  A4C:     54.3 ml RIGHT VENTRICLE RV S prime:     13.20 cm/s TAPSE (M-mode): 2.2 cm LEFT ATRIUM             Index       RIGHT ATRIUM           Index LA diam:        3.60 cm 1.79 cm/m  RA Area:     13.40 cm LA Vol (A2C):   37.0 ml 18.38 ml/m RA Volume:   29.60 ml  14.70 ml/m LA Vol (A4C):   41.9 ml 20.81 ml/m LA Biplane Vol: 41.3 ml 20.51 ml/m  AORTIC VALVE AV Area (Vmax):    2.56 cm AV Area (Vmean):   2.26 cm AV Area (VTI):     2.58 cm AV Vmax:           99.00 cm/s AV Vmean:          77.000 cm/s AV VTI:            0.240 m AV Peak Grad:      3.9 mmHg AV Mean Grad:      3.0 mmHg LVOT Vmax:         80.80 cm/s LVOT Vmean:        55.300 cm/s LVOT VTI:          0.197 m LVOT/AV VTI ratio: 0.82  AORTA Ao Root diam: 2.60 cm MITRAL VALVE MV Area (PHT): 5.37 cm    SHUNTS MV E velocity: 80.50 cm/s  Systemic VTI:  0.20 m MV A velocity: 50.90 cm/s  Systemic Diam: 2.00 cm MV E/A ratio:  1.58 Fransico Him MD Electronically signed by Fransico Him MD Signature Date/Time: 02/23/2021/1:39:47 PM    Final      LOS: 4 days   Oren Binet, MD  [facility]    To contact the attending provider between 7A-7P or the covering provider during after hours 7P-7A, please log into the web site  [email or web address] and access using universal [place] password for that web site. If you do not have the password, please call the hospital operator.  02/24/2021, 12:25 PM

## 2021-02-25 ENCOUNTER — Inpatient Hospital Stay (HOSPITAL_COMMUNITY): Payer: Medicare Other

## 2021-02-25 ENCOUNTER — Encounter (HOSPITAL_COMMUNITY): Payer: Self-pay | Admitting: Internal Medicine

## 2021-02-25 DIAGNOSIS — I482 Chronic atrial fibrillation, unspecified: Secondary | ICD-10-CM | POA: Diagnosis not present

## 2021-02-25 DIAGNOSIS — K5720 Diverticulitis of large intestine with perforation and abscess without bleeding: Secondary | ICD-10-CM | POA: Diagnosis not present

## 2021-02-25 DIAGNOSIS — N179 Acute kidney failure, unspecified: Secondary | ICD-10-CM | POA: Diagnosis not present

## 2021-02-25 DIAGNOSIS — K5792 Diverticulitis of intestine, part unspecified, without perforation or abscess without bleeding: Secondary | ICD-10-CM | POA: Diagnosis not present

## 2021-02-25 LAB — COMPREHENSIVE METABOLIC PANEL
ALT: 12 U/L (ref 0–44)
AST: 12 U/L — ABNORMAL LOW (ref 15–41)
Albumin: 2 g/dL — ABNORMAL LOW (ref 3.5–5.0)
Alkaline Phosphatase: 50 U/L (ref 38–126)
Anion gap: 11 (ref 5–15)
BUN: 11 mg/dL (ref 8–23)
CO2: 26 mmol/L (ref 22–32)
Calcium: 8.3 mg/dL — ABNORMAL LOW (ref 8.9–10.3)
Chloride: 98 mmol/L (ref 98–111)
Creatinine, Ser: 1.22 mg/dL (ref 0.61–1.24)
GFR, Estimated: >60 mL/min (ref >60)
Glucose, Bld: 91 mg/dL (ref 70–99)
Potassium: 4 mmol/L (ref 3.5–5.1)
Sodium: 135 mmol/L (ref 135–145)
Total Bilirubin: 1.1 mg/dL (ref 0.3–1.2)
Total Protein: 5.3 g/dL — ABNORMAL LOW (ref 6.5–8.1)

## 2021-02-25 LAB — TRIGLYCERIDES: Triglycerides: 82 mg/dL (ref <150)

## 2021-02-25 LAB — CBC
HCT: 39.5 % (ref 39.0–52.0)
Hemoglobin: 13.8 g/dL (ref 13.0–17.0)
MCH: 35.3 pg — ABNORMAL HIGH (ref 26.0–34.0)
MCHC: 34.9 g/dL (ref 30.0–36.0)
MCV: 101 fL — ABNORMAL HIGH (ref 80.0–100.0)
Platelets: 337 10*3/uL (ref 150–400)
RBC: 3.91 MIL/uL — ABNORMAL LOW (ref 4.22–5.81)
RDW: 13.2 % (ref 11.5–15.5)
WBC: 15.6 10*3/uL — ABNORMAL HIGH (ref 4.0–10.5)
nRBC: 0 % (ref 0.0–0.2)

## 2021-02-25 LAB — HEPARIN LEVEL (UNFRACTIONATED)
Heparin Unfractionated: 0.23 IU/mL — ABNORMAL LOW (ref 0.30–0.70)
Heparin Unfractionated: 0.22 IU/mL — ABNORMAL LOW (ref 0.30–0.70)
Heparin Unfractionated: 0.22 IU/mL — ABNORMAL LOW (ref 0.30–0.70)

## 2021-02-25 LAB — GLUCOSE, CAPILLARY
Glucose-Capillary: 107 mg/dL — ABNORMAL HIGH (ref 70–99)
Glucose-Capillary: 109 mg/dL — ABNORMAL HIGH (ref 70–99)
Glucose-Capillary: 114 mg/dL — ABNORMAL HIGH (ref 70–99)
Glucose-Capillary: 170 mg/dL — ABNORMAL HIGH (ref 70–99)
Glucose-Capillary: 172 mg/dL — ABNORMAL HIGH (ref 70–99)
Glucose-Capillary: 98 mg/dL (ref 70–99)

## 2021-02-25 LAB — HEMOGLOBIN A1C
Hgb A1c MFr Bld: 5.9 % — ABNORMAL HIGH (ref 4.8–5.6)
Mean Plasma Glucose: 122.63 mg/dL

## 2021-02-25 LAB — PHOSPHORUS: Phosphorus: 3.1 mg/dL (ref 2.5–4.6)

## 2021-02-25 LAB — MAGNESIUM: Magnesium: 1.8 mg/dL (ref 1.7–2.4)

## 2021-02-25 MED ORDER — INSULIN ASPART 100 UNIT/ML IJ SOLN
0.0000 [IU] | INTRAMUSCULAR | Status: DC
  Administered 2021-02-25 – 2021-02-26 (×3): 2 [IU] via SUBCUTANEOUS

## 2021-02-25 MED ORDER — TRAVASOL 10 % IV SOLN
INTRAVENOUS | Status: AC
  Filled 2021-02-25: qty 480

## 2021-02-25 MED ORDER — MAGNESIUM SULFATE IN D5W 1-5 GM/100ML-% IV SOLN
1.0000 g | Freq: Once | INTRAVENOUS | Status: AC
  Administered 2021-02-25: 1 g via INTRAVENOUS
  Filled 2021-02-25: qty 100

## 2021-02-25 MED ORDER — POTASSIUM CHLORIDE 2 MEQ/ML IV SOLN
INTRAVENOUS | Status: AC
  Filled 2021-02-25 (×3): qty 1000

## 2021-02-25 MED ORDER — TRAVASOL 10 % IV SOLN: INTRAVENOUS | Status: DC

## 2021-02-25 NOTE — Progress Notes (Signed)
Referring Physician(s): Richard Miu, PA-C  Supervising Physician: Corrie Mckusick  Patient Status:  [facility] - In-pt  Chief Complaint: Follow up LLQ drain placed 02/24/21 in IR  Subjective:  Patient laying in bed, wife and RN at bedside. He notes right testicular swelling which began today and is very painful, he is waiting for an ultrasound currently. He reports LLQ pain as well as suprapubic pain, has urinated several times today but only small amounts each time. He denies hematuria. He has not had a bowel movement for 5 days, minimal flatus. He is wondering if he can have something longer acting for pain, the dilaudid works well but only for a short period of time. He is also wondering if he can have something for sleep since he is only able to sleep for brief periods at a time. His wife is wondering if the fluid culture has returned.  Allergies: Atorvastatin  Medications: Prior to Admission medications   Medication Sig Start Date End Date Taking? Authorizing Provider  calcium carbonate (TUMS - DOSED IN MG ELEMENTAL CALCIUM) 500 MG chewable tablet Chew 1 tablet by mouth 3 (three) times daily as needed for indigestion or heartburn.   Yes [provider]  diltiazem (DILT-XR) 240 MG 24 hr capsule Take 1 capsule by mouth daily with supper. Patient overdue for an appointment with Dr [name] and needs to call and schedule for further refills 1st attempt Patient taking differently: Take 240 mg by mouth every evening. 02/08/21  Yes Fay Records, MD  diphenhydrAMINE HCl, Sleep, (ZZZQUIL PO) Take 2 capsules by mouth at bedtime as needed (sleep).   Yes [provider]  linaCLOtide (LINZESS PO) Take 1 capsule by mouth daily.   Yes [provider]  losartan-hydrochlorothiazide (HYZAAR) 100-25 MG tablet Take 1 tablet by mouth daily. Please schedule overdue appointment for future refills. 3rd and final attempt. Patient taking differently: Take 0.5 tablets by mouth daily. 10/26/20   Yes Fay Records, MD  rosuvastatin (CRESTOR) 20 MG tablet TAKE 1 TABLET(20 MG) BY MOUTH DAILY Patient taking differently: Take 20 mg by mouth daily. 04/28/20  Yes Fay Records, MD  sotalol (BETAPACE) 120 MG tablet TAKE 1 TABLET(120 MG) BY MOUTH EVERY 12 HOURS Patient taking differently: Take 120 mg by mouth 2 (two) times daily. 08/11/20  Yes Fay Records, MD  XARELTO 20 MG TABS tablet TAKE 1 TABLET BY MOUTH EVERY DAY WITH DINNER Patient taking differently: Take 20 mg by mouth at bedtime. 09/10/20  Yes Camnitz, Will Hassell Done, MD  COVID-19 mRNA vaccine, Pfizer, 30 MCG/0.3ML injection INJECT AS DIRECTED 04/05/20 04/05/21  Carlyle Basques, MD  HYDROcodone-acetaminophen [facility]) 5-325 MG tablet Take 1-2 tablets by mouth every 6 (six) hours as needed. Patient not taking: No sig reported 06/28/20   Jacqlyn Larsen, PA-C     Vital Signs: BP 132/77 (BP Location: Left Wrist)   Pulse 90   Temp 98.8 F (37.1 C) (Oral)   Resp 18   Ht 6' (1.829 m)   Wt 175 lb 0.7 oz (79.4 kg)   SpO2 97%   BMI 23.74 kg/m   Physical Exam Vitals and nursing note reviewed.  Constitutional:      General: He is not in acute distress. HENT:     Head: Normocephalic.  Cardiovascular:     Rate and Rhythm: Normal rate.  Pulmonary:     Effort: Pulmonary effort is normal.  Abdominal:     Tenderness: There is abdominal tenderness (LLQ, suprapubic).  Comments: (+) NG in place (+) LLQ drain to suction bulb with ~15 cc dark bloody output. Flushes/aspirates easily. Suture and stat lock in place, dressed appropriately. Mildly TTP.  Skin:    General: Skin is warm and dry.  Neurological:     Mental Status: He is alert. Mental status is at baseline.  Psychiatric:        Mood and Affect: Mood normal.        Thought Content: Thought content normal.        Judgment: Judgment normal.    Imaging: CT ABDOMEN PELVIS WO CONTRAST  Result Date: 02/23/2021 CLINICAL DATA:  Abdominal abscess/infection suspected. Diverticulitis.  EXAM: CT ABDOMEN AND PELVIS WITHOUT CONTRAST TECHNIQUE: Multidetector CT imaging of the abdomen and pelvis was performed following the standard protocol without IV contrast. COMPARISON:  CT abdomen and pelvis 02/19/2021. FINDINGS: Lower chest: There are new small bilateral pleural effusions, right greater than left. There is also new atelectasis in the bilateral lower lobes, right greater than left. Hepatobiliary: There is questionable mild inflammatory stranding surrounding the gallbladder. No calcified gallstones are seen. There is no biliary ductal dilatation. The liver appears within normal limits. Pancreas: Unremarkable. No pancreatic ductal dilatation or surrounding inflammatory changes. Spleen: Normal in size without focal abnormality. 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 The right adrenal gland is within normal limits. There are no urinary tract calculi identified. There is no hydronephrosis. There is stable bilateral nonspecific perinephric fat stranding. Bladder is decompressed. Stomach/Bowel: There is diffuse colonic diverticulosis. There is wall thickening and inflammation involving the sigmoid colon compatible with acute diverticulitis similar to the prior examination. Within the pelvis superior to the level of sigmoid colon diverticulitis there is an air-fluid collection with surrounding inflammation measuring 5.2 x 3.0 by 3.7 cm. This has increased in size in the interval. Findings are suspicious for abscess. This fluid collection abuts adjacent small bowel loops. Additionally there is wall thickening and inflammation involving diverticula in the ascending colon images 4/49 and 50 without evidence for perforation or abscess. The colon is nondilated. The appendix is within normal limits. Enteric tube tip is in the proximal stomach. Contrast distends the stomach. Duodenal diverticulum is present.  Mid/proximal small bowel loops are dilated measuring up to 4.7 cm. There is no definite transition point, but distal small bowel loops are decompressed. Contrast is not enter mid or distal small bowel at this time. Vascular/Lymphatic: Aortic atherosclerosis. No enlarged abdominal or pelvic lymph nodes. Reproductive: Prostate is unremarkable. Other: There is new presacral edema. There is trace free fluid in the pelvis. There is no focal abdominal wall hernia. There is mild body wall edema which is new from the prior examination. Musculoskeletal: There is no acute fracture or dislocation. 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 IMPRESSION: 1. Acute sigmoid colon diverticulitis changes are again noted. There is an air-fluid collection with surrounding inflammation in the pelvis adjacent to the sigmoid colon worrisome for perforation with abscess formation. This collection has increased in size now measuring 5.2 x 3.0 x 3.7 cm. 2. Acute uncomplicated ascending colon diverticulitis. 3. Dilated proximal small bowel may represent partial small bowel obstruction or ileus. 4. New small bilateral pleural effusions with bibasilar atelectasis. 5. New presacral edema and body wall edema. 6. Aortic Atherosclerosis (ICD10-I70.0). Electronically Signed   By: Ronney Asters M.D.   On: 02/23/2021 21:36   DG Abd  1 View  Result Date: 02/21/2021 CLINICAL DATA:  Abdominal pain. Diverticulitis of large intestine with perforation and abscess without bleeding. EXAM: ABDOMEN - 1 VIEW COMPARISON:  Radiographs yesterday.  CT 02/19/2021 FINDINGS: Divided supine views of the abdomen obtained. The enteric tube is not definitively seen on the current exam. There is gaseous gastric distension. There is progressive gaseous small bowel distension, measuring up to 5.1 cm in the left abdomen. Air throughout the colon. The extraluminal gas and fluid collection on prior CT is  not confidently seen by radiograph. No large volume pneumoperitoneum on this supine exam. IMPRESSION: Gaseous gastric distension. Progressive gaseous small bowel distension, measuring up to 5.1 cm in the left abdomen. Query generalized ileus versus developing obstruction. Electronically Signed   By: Keith Rake M.D.   On: 02/21/2021 18:23   DG Abd Portable 1V  Result Date: 02/25/2021 CLINICAL DATA:  Epigastric abdominal pain. EXAM: PORTABLE ABDOMEN - 1 VIEW COMPARISON:  February 21, 2021. FINDINGS: Distal tip of nasogastric tube is seen in expected position of proximal stomach. Small bowel dilatation is noted concerning for distal small bowel obstruction or possibly ileus. Percutaneous drainage catheter is noted in the pelvis. IMPRESSION: Distal tip of nasogastric tube seen in expected position of proximal stomach. Small bowel dilatation is noted concerning for distal small bowel obstruction or possibly ileus. Electronically Signed   By: Marijo Conception M.D.   On: 02/25/2021 12:20   DG Abd Portable 1V  Result Date: 02/21/2021 CLINICAL DATA:  NG tube placement. EXAM: PORTABLE ABDOMEN - 1 VIEW COMPARISON:  Radiograph earlier today. FINDINGS: Tip of the enteric tube is below the diaphragm in the stomach. The side-port is in the region of the gastroesophageal junction. There is slight decreased gaseous gastric distension. Dilated small bowel in the upper abdomen is grossly similar. IMPRESSION: 1. Tip of the enteric tube below the diaphragm in the stomach, side-port in the region of the gastroesophageal junction. Consider advancement of 3-4 cm for optimal placement. Slight decreased gaseous gastric distension. 2. Dilated small bowel in the upper abdomen, similar to earlier today. Electronically Signed   By: Keith Rake M.D.   On: 02/21/2021 23:25   ECHOCARDIOGRAM COMPLETE  Result Date: 02/23/2021    ECHOCARDIOGRAM REPORT   Patient Name:   SHENAN TIPPLE Date of Exam: 02/23/2021 Medical Rec #:   [id number]        Height:       72.0 in Accession #:    [id number]       Weight:       175.0 lb Date of Birth:  [birth year]       BSA:          2.013 m Patient Age:    46 years         BP:           142/72 mmHg Patient Gender: M                HR:           76 bpm. Exam Location:  Inpatient Procedure: 2D Echo, Cardiac Doppler and Color Doppler Indications:    a-fib  History:        Patient has prior history of Echocardiogram examinations, most                 recent 10/03/2019. Arrythmias:Atrial Fibrillation; Risk                 Factors:Hypertension.  Sonographer:    MH  Referring Phys: Elliott  1. Left ventricular ejection fraction, by estimation, is 60 to 65%. The left ventricle has normal function. The left ventricle has no regional wall motion abnormalities. There is moderate left ventricular hypertrophy of the basal-septal segment. Left ventricular diastolic parameters were normal.  2. Right ventricular systolic function is normal. The right ventricular size is normal. Tricuspid regurgitation signal is inadequate for assessing PA pressure.  3. The mitral valve is normal in structure. Trivial mitral valve regurgitation. No evidence of mitral stenosis.  4. The aortic valve is normal in structure. Aortic valve regurgitation is not visualized. No aortic stenosis is present. FINDINGS  Left Ventricle: Left ventricular ejection fraction, by estimation, is 60 to 65%. The left ventricle has normal function. The left ventricle has no regional wall motion abnormalities. The left ventricular internal cavity size was normal in size. There is  moderate left ventricular hypertrophy of the basal-septal segment. Left ventricular diastolic parameters were normal. Normal left ventricular filling pressure. Right Ventricle: The right ventricular size is normal. No increase in right ventricular wall thickness. Right ventricular systolic function is normal. Tricuspid regurgitation signal is inadequate for  assessing PA pressure. Left Atrium: Left atrial size was normal in size. Right Atrium: Right atrial size was normal in size. Pericardium: There is no evidence of pericardial effusion. Mitral Valve: The mitral valve is normal in structure. Trivial mitral valve regurgitation. No evidence of mitral valve stenosis. Tricuspid Valve: The tricuspid valve is normal in structure. Tricuspid valve regurgitation is not demonstrated. No evidence of tricuspid stenosis. Aortic Valve: The aortic valve is normal in structure. Aortic valve regurgitation is not visualized. No aortic stenosis is present. Aortic valve mean gradient measures 3.0 mmHg. Aortic valve peak gradient measures 3.9 mmHg. Aortic valve area, by VTI measures 2.58 cm. Pulmonic Valve: The pulmonic valve was normal in structure. Pulmonic valve regurgitation is not visualized. No evidence of pulmonic stenosis. Aorta: The aortic root is normal in size and structure. Venous: The inferior vena cava was not well visualized. IAS/Shunts: No atrial level shunt detected by color flow Doppler.  LEFT VENTRICLE PLAX 2D LVIDd:         3.90 cm     Diastology LVIDs:         2.80 cm     LV e' medial:    8.81 cm/s LV PW:         1.00 cm     LV E/e' medial:  9.1 LV IVS:        1.50 cm     LV e' lateral:   9.03 cm/s LVOT diam:     2.00 cm     LV E/e' lateral: 8.9 LV SV:         62 LV SV Index:   31 LVOT Area:     3.14 cm  LV Volumes (MOD) LV vol d, MOD A4C: 54.3 ml LV vol s, MOD A4C: 20.0 ml LV SV MOD A4C:     54.3 ml RIGHT VENTRICLE RV S prime:     13.20 cm/s TAPSE (M-mode): 2.2 cm LEFT ATRIUM             Index       RIGHT ATRIUM           Index LA diam:        3.60 cm 1.79 cm/m  RA Area:     13.40 cm LA Vol (A2C):   37.0 ml 18.38 ml/m RA Volume:   29.60  ml  14.70 ml/m LA Vol (A4C):   41.9 ml 20.81 ml/m LA Biplane Vol: 41.3 ml 20.51 ml/m  AORTIC VALVE AV Area (Vmax):    2.56 cm AV Area (Vmean):   2.26 cm AV Area (VTI):     2.58 cm AV Vmax:           99.00 cm/s AV Vmean:           77.000 cm/s AV VTI:            0.240 m AV Peak Grad:      3.9 mmHg AV Mean Grad:      3.0 mmHg LVOT Vmax:         80.80 cm/s LVOT Vmean:        55.300 cm/s LVOT VTI:          0.197 m LVOT/AV VTI ratio: 0.82  AORTA Ao Root diam: 2.60 cm MITRAL VALVE MV Area (PHT): 5.37 cm    SHUNTS MV E velocity: 80.50 cm/s  Systemic VTI:  0.20 m MV A velocity: 50.90 cm/s  Systemic Diam: 2.00 cm MV E/A ratio:  1.58 Fransico Him MD Electronically signed by Fransico Him MD Signature Date/Time: 02/23/2021/1:39:47 PM    Final    CT IMAGE GUIDED DRAINAGE BY PERCUTANEOUS CATHETER  Result Date: 02/24/2021 INDICATION: Perforated diverticulitis EXAM: CT IMAGE GUIDED DRAINAGE BY PERCUTANEOUS CATHETER COMPARISON:  CT Abdomen Pelvis, 02/23/2021. MEDICATIONS: The patient is currently admitted to the hospital and receiving intravenous antibiotics. The antibiotics were administered within an appropriate time frame prior to the initiation of the procedure. ANESTHESIA/SEDATION: Moderate (conscious) sedation was employed during this procedure. A total of Versed 2 mg and Fentanyl 100 mcg was administered intravenously. Moderate Sedation Time: 25 minutes. The patient's level of consciousness and vital signs were monitored continuously by radiology nursing throughout the procedure under my direct supervision. CONTRAST:  None COMPLICATIONS: None immediate. PROCEDURE: Informed written consent was obtained from the patient after a discussion of the risks, benefits and alternatives to treatment. The patient was placed supine on the CT gantry and a pre procedural CT was performed re-demonstrating the known abscess/fluid collection within the anterior pelvis. The procedure was planned. A timeout was performed prior to the initiation of the procedure. The LEFT lower quadrant was prepped and draped in the usual sterile fashion. The overlying soft tissues were anesthetized with 1% lidocaine with epinephrine. Appropriate trajectory was planned with the use  of a 22 gauge spinal needle. An 18 gauge trocar needle was advanced into the abscess/fluid collection and a short Amplatz super stiff wire was coiled within the collection. Appropriate positioning was confirmed with a limited CT scan. The tract was serially dilated allowing placement of a 12 [place] all-purpose drainage catheter. Appropriate positioning was confirmed with a limited postprocedural CT scan. 15 mL of feculent fluid was aspirated. The tube was connected to a bulb suction and sutured in place. A dressing was placed. The patient tolerated the procedure well without immediate post procedural complication. IMPRESSION: Successful CT guided placement of a 12 French drainage catheter into the anterior pelvic collection for perforated diverticulitis, as above. Samples were sent to the laboratory as requested by the ordering clinical team. Michaelle Birks, MD [facility] Electronically Signed   By: Michaelle Birks M.D.   On: 02/24/2021 16:01   [place] EKG SITE RITE  Result Date: 02/24/2021 If Site Rite image not attached, placement could not be confirmed due to current cardiac rhythm.   Labs:  CBC: Recent Labs    02/22/21 0022 02/23/21 0120 02/24/21 0356 02/25/21 0144  WBC 12.8* 16.1* 14.6* 15.6*  HGB 13.5 13.3 12.8* 13.8  HCT 38.8* 39.5 37.7* 39.5  PLT 278 254 286 337    COAGS: Recent Labs    02/19/21 1838 02/20/21 1137 02/21/21 0119 02/21/21 1101 02/21/21 2038 02/22/21 0022  INR 1.1  --   --   --   --   --   APTT  --    < > 39* 56* 118* 104*   < > = values in this interval not displayed.    BMP: Recent Labs    02/22/21 1619 02/23/21 0120 02/24/21 0356 02/25/21 0144  NA 137 136 134* 135  K 3.4* 3.7 4.6 4.0  CL 94* 98 98 98  CO2 34* '30 24 26  '$ GLUCOSE 113* 96 80 91  BUN '19 19 15 11  '$ CALCIUM 8.5* 8.5* 8.7* 8.3*  CREATININE 1.36* 1.32* 1.01 1.22  GFRNONAA 56* 58* >60 >60    LIVER FUNCTION TESTS: Recent Labs     02/19/21 1653 02/20/21 0549 02/23/21 0120 02/25/21 0144  BILITOT 1.1 1.4* 0.4 1.1  AST 18 36 15 12*  ALT '11 23 13 12  '$ ALKPHOS 85 94 64 50  PROT 7.6 6.7 5.4* 5.3*  ALBUMIN 3.9 2.7* 2.2* 2.0*    Assessment and Plan:  70 y/o M admitted 9/3 with sepsis secondary to acute perforated sigmoid diverticulitis, he underwent LLQ drain placement 9/8 in IR due to enlargement of previously noted air-fluid collection.  Patient reports right testicular swelling, LLQ pain and suprapubic pain. He reports minimal urine output, no BM x 5 days and minima flatus. Per CCS note patient with persistently dilated on today's x-ray and is tentatively planned for exlap, partial colectomy and colostomy tomorrow.   Drain Location: LLQ Size: Fr size: 12 Fr Date of placement: 02/24/21  Currently to: Drain collection device: suction bulb 24 hour output:  Output by Drain (mL) 02/23/21 0701 - 02/23/21 1900 02/23/21 1901 - 02/24/21 0700 02/24/21 0701 - 02/24/21 1900 02/24/21 1901 - 02/25/21 0700 02/25/21 0701 - 02/25/21 1622  Closed System Drain 1 Left;Anterior;Other (Comment) LUQ Bulb (JP) 12 Fr.   27  30    Interval imaging/drain manipulation:  None  Current examination: Flushes/aspirates easily.  Insertion site unremarkable. Suture and stat lock in place. Dressed appropriately.   Plan: Continue TID flushes with 5 cc NS. Record output Q shift. Dressing changes QD or PRN if soiled.  Call IR APP or on call IR MD if difficulty flushing or sudden change in drain output.  Repeat imaging/possible drain injection once output < 10 mL/QD (excluding flush material.)  Discharge planning: Please contact IR APP or on call IR MD prior to patient d/c to ensure appropriate follow up plans are in place. Typically patient will follow up with IR clinic 10-14 days post d/c for repeat imaging/possible drain injection. IR scheduler will contact patient with date/time of appointment. Patient will need to flush drain QD with 5 cc  NS, record output QD, dressing changes every 2-3 days or earlier if soiled.   IR will continue to follow - please call with questions or concerns.  Electronically Signed: Joaquim Nam, PA-C 02/25/2021, 3:26 PM   I spent a total of 15 Minutes at the the patient's bedside AND on the patient's hospital floor or unit, greater than 50% of which was counseling/coordinating care for LLQ drain follow up.

## 2021-02-25 NOTE — Progress Notes (Signed)
ANTICOAGULATION CONSULT NOTE  Pharmacy Consult for heparin Indication: atrial fibrillation  Allergies  Allergen Reactions   Atorvastatin Other (See Comments)    myalgias    Patient Measurements: Height: 6' (182.9 cm) Weight: 79.4 kg (175 lb 0.7 oz) IBW/kg (Calculated) : 77.6 Heparin Dosing Weight: 79.4kg  Vital Signs: Temp: 99 F (37.2 C) (09/09 0800) Temp Source: Oral (09/09 0800) BP: 127/71 (09/09 1010) Pulse Rate: 89 (09/09 1010)  Labs: Recent Labs    02/23/21 0120 02/24/21 0356 02/25/21 0144 02/25/21 0921  HGB 13.3 12.8* 13.8  --   HCT 39.5 37.7* 39.5  --   PLT 254 286 337  --   HEPARINUNFRC 0.36 0.53 0.23* 0.22*  CREATININE 1.32* 1.01 1.22  --      Estimated Creatinine Clearance: 61.8 mL/min (by C-G formula based on SCr of 1.22 mg/dL).  Assessment: 70 y/o male admitted for sepsis from contained perforated diverticulitis with active ileus. On PTA Xarelto for Afib with last dose on 9/2 at 2000.  On IV heparin-Xarelto on hold due to possibility that patient may require a laparotomy.        Heparin level remains therapeutic on heparin infusion 1500 units/hr.  No bleeding reported. PTA Xarelto no longer affecting anti-Xa level so will utilize these for further monitoring. Cardiologist noted may transtion back to Xarelto once all procedures complete.  Repeat CT 9/7 with increased size of abscess, s/p IR drain 9/8.  Surgery ordered to stop heparin drip tonight MN 9/9 in prep for surgery on 9/10.   HL dropped to 0.23 this AM on heparin 1500 units/hr.  RN reports heparin stopped 9/8 ~0900 when pt in IR for drain placement,  then heparin drip resumed ~ 17:15 on 9/8.  This HL drawn at 01:23 9/9, thus possibly low due to heparin off on 9/8. -rechecked HL  this AM which is 0.22 , subtherapeutic thus will increase heparin rate.   PICC line placed 9/8-RN confirms Hep gtt infusing via pheripheral site.  CBC stable wnl, No bleeding noted.   Goal of Therapy:  Heparin level  0.3-0.7 units/ml Monitor platelets by anticoagulation protocol: Yes   Plan:  Increase heparin drip to 1650 units/hr = 16.5 ml/hr.  Check 6-8 hr HL.  Stop heparin drip '@MN'$  tonight 9/9, for surgery 9/10 Daily HL and CBC Monitor for signs and symptoms of bleeding.    Nicole Cella, RPh Clinical Pharmacist [phone] Please see amion for complete clinical pharmacist phone list 02/25/2021, 11:17 AM

## 2021-02-25 NOTE — Progress Notes (Signed)
PHARMACY - TOTAL PARENTERAL NUTRITION CONSULT NOTE   Indication: Prolonged ileus  Patient Measurements: Height: 6' (182.9 cm) Weight: 79.4 kg (175 lb 0.7 oz) IBW/kg (Calculated) : 77.6 TPN AdjBW (KG): 79.4 Body mass index is 23.74 kg/m.  Assessment: 37 YOM with lower abdominal pain/N/V found to have perforate diverticulitis. CT abd on 9/3 found to have active ileus/PSBO from diverticulitis/bowel inflammation. Pharmacy consulted to start TPN for prolonged ileus. Of note patient has a h/o alcohol dependency and is at high risk for refeeding.   Glucose / Insulin: CBGs 81-109, no SSI  Electrolytes: Na 135, K 4 (goal >/=4), SCr 1.22, Mg 1.8 (goal >/=2), Phos 3.1 Renal: SCr 1.22, BUN 11  Hepatic: AST/ALT low to normal, Tbili wnl, Albumin 2  Intake / Output; MIVF: UOP 1.7L, NG O/P 1.2L, Drains 27 mL GI Imaging: 9/3 CT abd: Active ileus/PSBO GI Surgeries / Procedures:   Central access: PICC line 9/8 >>  TPN start date: 9/9>>   Nutritional Goals: Goal TPN rate is 85 mL/hr (provides 102 g of protein and 2087 kcals per day)  RD Assessment: Estimated Needs Total Energy Estimated Needs: 2000-2200 Total Protein Estimated Needs: 100-110 grams Total Fluid Estimated Needs: >2L  Current Nutrition:  NPO  Plan:  Start TPN at 74m/hr at 1800. Will advance to goal rate of 85 mL/hr as tolerated  Electrolytes in TPN: Na 942m/L, K 5012mL, Ca 5mE23m, Mg 5mEq61m and Phos 15mmo34m Cl:Ac 1:1 Add standard MVI and trace elements to TPN Initiate Sensitive q4h SSI and adjust as needed  Reduce MIVF to 10 mL/hr at 1800 Monitor TPN labs on Mon/Thurs and tomorrow  MgSulfate 1 gm IV x 1 dose today  Multivitamin order d/c'ed and added to TPN bag   BenjamAlbertina ParrmD., BCPS, BCCCP Clinical Pharmacist Please refer to AMION [facility]

## 2021-02-25 NOTE — Progress Notes (Signed)
ANTICOAGULATION CONSULT NOTE  Pharmacy Consult for heparin Indication: atrial fibrillation  Allergies  Allergen Reactions   Atorvastatin Other (See Comments)    myalgias    Patient Measurements: Height: 6' (182.9 cm) Weight: 79.4 kg (175 lb 0.7 oz) IBW/kg (Calculated) : 77.6 Heparin Dosing Weight: 79.4kg  Vital Signs: Temp: 99.3 F (37.4 C) (09/09 1545) Temp Source: Oral (09/09 1545) BP: 135/74 (09/09 1545) Pulse Rate: 94 (09/09 1545)  Labs: Recent Labs    02/23/21 0120 02/24/21 0356 02/25/21 0144 02/25/21 0921 02/25/21 1804  HGB 13.3 12.8* 13.8  --   --   HCT 39.5 37.7* 39.5  --   --   PLT 254 286 337  --   --   HEPARINUNFRC 0.36 0.53 0.23* 0.22* 0.22*  CREATININE 1.32* 1.01 1.22  --   --      Estimated Creatinine Clearance: 61.8 mL/min (by C-G formula based on SCr of 1.22 mg/dL).  Assessment: 71 y/o male admitted for sepsis from contained perforated diverticulitis with active ileus. On PTA Xarelto for Afib with last dose on 9/2 at 2000.  On IV heparin-Xarelto on hold due to possibility that patient may require a laparotomy.        Heparin level remains therapeutic on heparin infusion 1500 units/hr.  No bleeding reported. PTA Xarelto no longer affecting anti-Xa level so will utilize these for further monitoring. Cardiologist noted may transtion back to Xarelto once all procedures complete.  Repeat CT 9/7 with increased size of abscess, s/p IR drain 9/8.  Surgery ordered to stop heparin drip tonight MN 9/9 in prep for surgery on 9/10.   HL dropped to 0.23 this AM on heparin 1500 units/hr.  RN reports heparin stopped 9/8 ~0900 when pt in IR for drain placement,  then heparin drip resumed ~ 17:15 on 9/8.  This HL drawn at 01:23 9/9, thus possibly low due to heparin off on 9/8. -rechecked HL  this AM which is 0.22 , subtherapeutic thus will increase heparin rate.   PICC line placed 9/8-RN confirms Hep gtt infusing via pheripheral site.  CBC stable wnl, No bleeding  noted.   Goal of Therapy:  Heparin level 0.3-0.7 units/ml Monitor platelets by anticoagulation protocol: Yes   Plan:  Increase heparin drip to 1850 units/hr Stop heparin drip '@MN'$  tonight 9/9, for surgery 9/10 Daily HL and CBC Monitor for signs and symptoms of bleeding.    Nicole Cella, RPh Clinical Pharmacist [phone] Please see amion for complete clinical pharmacist phone list 02/25/2021, 7:39 PM

## 2021-02-25 NOTE — Progress Notes (Signed)
PROGRESS NOTE        PATIENT DETAILS Name: Travis Conrad Age: 70 y.o. Sex: male Date of Birth: [birth year] Admit Date: 02/19/2021 Admitting Physician Etta Quill, DO PCP:Sun, [name], MD  Brief Narrative: Patient is a 70 y.o. male A. fib, HTN, HLD, prior diverticulitis, heavy EtOH use-who presented with lower abdominal pain/nausea/vomiting-on initial presentation to the ED found to have severe sepsis physiology with hypotension/AKI-felt to be due to perforated diverticulitis.  See below for further details.  Significant events: 9/3>> presented with lower abdominal pain/vomiting-sepsis from contained perforated diverticulitis.  Found to have very active ileus/PSBO from diverticulitis/bowel inflammation 9/4>> A. fib with RVR-not responding to Lopressor/Cardizem-BP soft-started amiodarone infusion-cardiology consulted. 9/7>> increase in size of pelvic abscess on repeat CT. 9/8>> CT-guided drainage of pelvic abscess 9/9>> right testicular pain/swelling-scrotal ultrasound with Doppler pending.  Significant studies: 9/3>> CT abdomen/pelvis: Acute sigmoid diverticulitis-small extraluminal gas-fluid collection in the sigmoid mesocolon consistent with abscess.  Dilated proximal small bowel-likely ileus. 9/7>> CT abdomen/pelvis: Abscess has increased in size 9/7>> Echo: EF 60-65%.  Antimicrobial therapy: Zosyn: 9/3>>  Microbiology data: 9/3>> COVID/influenza PCR: Negative 9/3>> blood culture: Negative 9/8>> culture pelvic abscess: Pending.  Procedures : 9/8>> CT-guided placement of 12 French drainage catheter into pelvic collection.  Consults: General surgery Cardiology Interventional radiology  DVT Prophylaxis : SCDs Start: 02/20/21 0002   Subjective: Has right testicular pain today.  No BM or flatus yet.  Assessment/Plan: Severe sepsis due to diverticulitis with contained perforation and small abscess: Sepsis physiology has resolved-although  continues to have leukocytosis-remains on Zosyn.  Underwent CT-guided drainage of pelvic abscess on 9/8.  General surgery following-with possible laparotomy/colectomy/colostomy tomorrow if clinically not better.  Since remains n.p.o. for the past several days-started on TNA today.  Ileus: Likely sequelae of diverticulitis-NG tube in place-no return of bowel function as of yet.  General surgery following.    AKI: Hemodynamically mediated-improved-creatinine has now normalized.  Hypokalemia: Repleted and has normalized.  Continue to follow periodically.  PAF with RVR: RVR provoked by sepsis/alcohol withdrawal-currently in sinus rhythm-remains on amiodarone infusion until oral intake can be established.  On IV heparin-Xarelto on hold due to the small possibility that patient may require a laparotomy.  Cardiology following.     Right scrotal pain and swelling: Per patient he had very mild pain and swelling for the past several days even before this hospitalization (claims he had a ultrasound done in a local urgent care)-however this morning he is right testicle appears to be swollen and erythematous.  Suspect epididymoorchitis-already on Zosyn-await ultrasound with Doppler.  ?Alcohol withdrawal: Awake and alert this morning-last drink was on 9/2.  Patient acknowledged almost daily alcohol use-but apparently has cut back significantly-spouse who I talked to on 9/6-thinks that this issue was probably overblown-and patient really does not drink all that much.  In any event-given his acute illness-tachycardic-confusion-he was treated with Ativan per [facility]WA protocol.  His mental status is much better-and even if he did have alcohol withdrawal-it was mild-and he is almost out of the window for any further withdrawal symptoms.  HTN: All antihypertensives on hold-as BP soft.    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  Diet: Diet  Order             Diet NPO time specified Except for: Sips with  Meds  Diet effective now                    Code Status: Full code  Family Communication: Spouse-Jimmi Sisler-[phone] updated at bedside on 9/9  Disposition Plan: Status is: Inpatient  Remains inpatient appropriate because:Inpatient level of care appropriate due to severity of illness  Dispo: The patient is from: Home              Anticipated d/c is to: Home              Patient currently is not medically stable to d/c.   Difficult to place patient No   Barriers to Discharge: Perforated diverticulitis with abscess-on IV antibiotics-developing alcohol withdrawal symptoms/PAF with RVR.  Antimicrobial agents: Anti-infectives (From admission, onward)    Start     Dose/Rate Route Frequency Ordered Stop   02/20/21 0300  piperacillin-tazobactam (ZOSYN) IVPB 3.375 g        3.375 g 12.5 mL/hr over 240 Minutes Intravenous Every 8 hours 02/19/21 1907     02/19/21 1900  ceFEPIme (MAXIPIME) 2 g in sodium chloride 0.9 % 100 mL IVPB  Status:  Discontinued        2 g 200 mL/hr over 30 Minutes Intravenous  Once 02/19/21 1857 02/19/21 1906   02/19/21 1900  metroNIDAZOLE (FLAGYL) IVPB 500 mg  Status:  Discontinued        500 mg 100 mL/hr over 60 Minutes Intravenous  Once 02/19/21 1857 02/19/21 1906   02/19/21 1815  piperacillin-tazobactam (ZOSYN) IVPB 3.375 g        3.375 g 100 mL/hr over 30 Minutes Intravenous  Once 02/19/21 1809 02/19/21 1944        Time spent: 35 minutes-Greater than 50% of this time was spent in counseling, explanation of diagnosis, planning of further management, and coordination of care.  MEDICATIONS: Scheduled Meds:  Chlorhexidine Gluconate Cloth  6 each Topical Daily   folic acid  1 mg Per Tube Daily   insulin aspart  0-9 Units Subcutaneous Q4H   nicotine  21 mg Transdermal Daily   pantoprazole (PROTONIX) IV  40 mg Intravenous Q24H   sodium chloride flush  10-40 mL Intracatheter  Q12H   thiamine  100 mg Per Tube Daily   Or   thiamine  100 mg Intravenous Daily   Continuous Infusions:  sodium chloride Stopped (02/23/21 0259)   amiodarone 30 mg/hr (02/24/21 1708)   heparin 1,650 Units/hr (02/25/21 1211)   lactated ringers with kcl 50 mL/hr at 02/24/21 1748   lactated ringers with kcl     piperacillin-tazobactam (ZOSYN)  IV 3.375 g (02/25/21 1135)   TPN ADULT (ION)     PRN Meds:.sodium chloride, acetaminophen **OR** acetaminophen, fentaNYL, fentaNYL, fentaNYL, HYDROmorphone (DILAUDID) injection, menthol-cetylpyridinium, midazolam, midazolam, midazolam, ondansetron **OR** ondansetron (ZOFRAN) IV, phenol, sodium chloride flush   PHYSICAL EXAM: Vital signs: Vitals:   02/25/21 0747 02/25/21 0800 02/25/21 1010 02/25/21 1205  BP: (!) 145/80 130/73 127/71 132/77  Pulse: 90 84 89 90  Resp: '18 15  18  '$ Temp:  99 F (37.2 C)  98.8 F (37.1 C)  TempSrc:  Oral  Oral  SpO2: 95% 93% 95% 97%  Weight:      Height:       Filed Weights   02/19/21 1800 02/20/21 2000  Weight: 79.4 kg 79.4 kg   Body mass index is 23.74 kg/m.   Gen Exam:Alert awake-not in any distress HEENT:atraumatic, normocephalic Chest: B/L clear to auscultation anteriorly CVS:S1S2 regular  Abdomen:soft non tender, non distended right testicle-swollen-erythematous and tender. Extremities:no edema Neurology: Non focal Skin: no rash    I have personally reviewed following labs and imaging studies  LABORATORY DATA: CBC: Recent Labs  Lab 02/19/21 1838 02/20/21 0549 02/21/21 1101 02/22/21 0022 02/23/21 0120 02/24/21 0356 02/25/21 0144  WBC  --    < > 13.9* 12.8* 16.1* 14.6* 15.6*  NEUTROABS 18.3*  --   --   --   --   --   --   HGB  --    < > 15.3 13.5 13.3 12.8* 13.8  HCT  --    < > 43.9 38.8* 39.5 37.7* 39.5  MCV  --    < > 99.3 100.0 101.8* 102.7* 101.0*  PLT  --    < > 311 278 254 286 337   < > = values in this interval not displayed.     Basic Metabolic Panel: Recent Labs  Lab  02/19/21 1838 02/20/21 0549 02/21/21 0119 02/22/21 0022 02/22/21 1619 02/23/21 0120 02/24/21 0356 02/25/21 0144  NA  --    < > 136 133* 137 136 134* 135  K  --    < > 2.7* 2.6* 3.4* 3.7 4.6 4.0  CL  --    < > 93* 92* 94* 98 98 98  CO2  --    < > 28 31 34* '30 24 26  '$ GLUCOSE  --    < > 114* 238* 113* 96 80 91  BUN  --    < > 37* 28* '19 19 15 11  '$ CREATININE  --    < > 2.19* 1.43* 1.36* 1.32* 1.01 1.22  CALCIUM  --    < > 9.2 8.3* 8.5* 8.5* 8.7* 8.3*  MG 2.0  --  1.8 2.1  --  2.0 2.0 1.8  PHOS 4.8*  --  3.4  --   --  2.5  --  3.1   < > = values in this interval not displayed.     GFR: Estimated Creatinine Clearance: 61.8 mL/min (by C-G formula based on SCr of 1.22 mg/dL).  Liver Function Tests: Recent Labs  Lab 02/19/21 1653 02/20/21 0549 02/23/21 0120 02/25/21 0144  AST 18 36 15 12*  ALT '11 23 13 12  '$ ALKPHOS 85 94 64 50  BILITOT 1.1 1.4* 0.4 1.1  PROT 7.6 6.7 5.4* 5.3*  ALBUMIN 3.9 2.7* 2.2* 2.0*    Recent Labs  Lab 02/19/21 1653  LIPASE 22    No results for input(s): AMMONIA in the last 168 hours.  Coagulation Profile: Recent Labs  Lab 02/19/21 1838  INR 1.1     Cardiac Enzymes: No results for input(s): CKTOTAL, CKMB, CKMBINDEX, TROPONINI in the last 168 hours.  BNP (last 3 results) No results for input(s): PROBNP in the last 8760 hours.  Lipid Profile: Recent Labs    02/25/21 0144  TRIG 82    Thyroid Function Tests: No results for input(s): TSH, T4TOTAL, FREET4, T3FREE, THYROIDAB in the last 72 hours.  Anemia Panel: No results for input(s): VITAMINB12, FOLATE, FERRITIN, TIBC, IRON, RETICCTPCT in the last 72 hours.  Urine analysis:    Component Value Date/Time   COLORURINE BROWN (A) 02/19/2021 1645   APPEARANCEUR CLOUDY (A) 02/19/2021 1645   LABSPEC 1.026 02/19/2021 1645   PHURINE 5.5 02/19/2021 1645   GLUCOSEU NEGATIVE 02/19/2021 1645   HGBUR LARGE (A) 02/19/2021 1645   BILIRUBINUR SMALL (A) 02/19/2021 [place]  02/19/2021 1645  PROTEINUR >300 (A) 02/19/2021 1645   UROBILINOGEN 1.0 12/29/2013 1735   NITRITE NEGATIVE 02/19/2021 1645   LEUKOCYTESUR NEGATIVE 02/19/2021 1645    Sepsis Labs: Lactic Acid, Venous    Component Value Date/Time   LATICACIDVEN 1.5 02/20/2021 0920    MICROBIOLOGY: Recent Results (from the past 240 hour(s))  Culture, blood (routine x 2)     Status: None   Collection Time: 02/19/21  6:07 PM   Specimen: BLOOD  Result Value Ref Range Status   Specimen Description   Final    BLOOD BLOOD LEFT FOREARM Performed at [facility], [address], [place], [place] [address]    Special Requests   Final    BOTTLES DRAWN AEROBIC AND ANAEROBIC Blood Culture adequate volume Performed at [facility], [address], [place], [place] [address]    Culture   Final    NO GROWTH 5 DAYS Performed at [facility], [place] [address], [place], [place] [address]    Report Status 02/24/2021 FINAL  Final  Culture, blood (routine x 2)     Status: None   Collection Time: 02/19/21  6:12 PM   Specimen: BLOOD  Result Value Ref Range Status   Specimen Description   Final    BLOOD BLOOD RIGHT FOREARM Performed at [facility], [address] [place], [place] [address]    Special Requests   Final    BOTTLES DRAWN AEROBIC AND ANAEROBIC Blood Culture adequate volume Performed at [facility], [address], [place], [place] [address]    Culture   Final    NO GROWTH 5 DAYS Performed at [facility], [place] [address], [place], [place] [address]    Report Status 02/24/2021 FINAL  Final  Resp Panel by RT-PCR (Flu A&B, Covid)     Status: None   Collection Time: 02/19/21  6:38 PM   Specimen: Nasopharyngeal(NP) swabs in vial transport medium  Result Value Ref Range Status   SARS Coronavirus 2 by RT PCR NEGATIVE NEGATIVE Final    Comment: (NOTE) SARS-CoV-2 target nucleic acids are NOT DETECTED.  The  SARS-CoV-2 RNA is generally detectable in upper respiratory specimens during the acute phase of infection. The lowest concentration of SARS-CoV-2 viral copies this assay can detect is 138 copies/mL. A negative result does not preclude SARS-Cov-2 infection and should not be used as the sole basis for treatment or other patient management decisions. A negative result may occur with  improper specimen collection/handling, submission of specimen other than nasopharyngeal swab, presence of viral mutation(s) within the areas targeted by this assay, and inadequate number of viral copies(<138 copies/mL). A negative result must be combined with clinical observations, patient history, and epidemiological information. The expected result is Negative.  Fact Sheet for Patients:  EntrepreneurPulse.com.au  Fact Sheet for Healthcare Providers:  IncredibleEmployment.be  This test is no t yet approved or cleared by the [place] FDA and  has been authorized for detection and/or diagnosis of SARS-CoV-2 by FDA under an Emergency Use Authorization (EUA). This EUA will remain  in effect (meaning this test can be used) for the duration of the COVID-19 declaration under Section 564(b)(1) of the Act, 21 U.S.C.section 360bbb-3(b)(1), unless the authorization is terminated  or revoked sooner.       Influenza A by PCR NEGATIVE NEGATIVE Final   Influenza B by PCR NEGATIVE NEGATIVE Final    Comment: (NOTE) The Xpert Xpress SARS-CoV-2/FLU/RSV plus assay is intended as an aid in the diagnosis  of influenza from Nasopharyngeal swab specimens and should not be used as a sole basis for treatment. Nasal washings and aspirates are unacceptable for Xpert Xpress SARS-CoV-2/FLU/RSV testing.  Fact Sheet for Patients: EntrepreneurPulse.com.au  Fact Sheet for Healthcare Providers: IncredibleEmployment.be  This test is not yet approved or  cleared by the [place] FDA and has been authorized for detection and/or diagnosis of SARS-CoV-2 by FDA under an Emergency Use Authorization (EUA). This EUA will remain in effect (meaning this test can be used) for the duration of the COVID-19 declaration under Section 564(b)(1) of the Act, 21 U.S.C. section 360bbb-3(b)(1), unless the authorization is terminated or revoked.  Performed at [facility], [address], [place], [place] [address]   Aerobic/Anaerobic Culture w Gram Stain (surgical/deep wound)     Status: None (Preliminary result)   Collection Time: 02/24/21  3:14 PM   Specimen: Abdomen; Abscess  Result Value Ref Range Status   Specimen Description ABDOMEN  Final   Special Requests NONE  Final   Gram Stain   Final    FEW SQUAMOUS EPITHELIAL CELLS PRESENT ABUNDANT WBC PRESENT,BOTH PMN AND MONONUCLEAR FEW GRAM POSITIVE COCCI FEW GRAM POSITIVE RODS    Culture   Final    CULTURE REINCUBATED FOR BETTER GROWTH Performed at [facility], [place] [address] [place], [place] [address]    Report Status PENDING  Incomplete    RADIOLOGY STUDIES/RESULTS: CT ABDOMEN PELVIS WO CONTRAST  Result Date: 02/23/2021 CLINICAL DATA:  Abdominal abscess/infection suspected. Diverticulitis. EXAM: CT ABDOMEN AND PELVIS WITHOUT CONTRAST TECHNIQUE: Multidetector CT imaging of the abdomen and pelvis was performed following the standard protocol without IV contrast. COMPARISON:  CT abdomen and pelvis 02/19/2021. FINDINGS: Lower chest: There are new small bilateral pleural effusions, right greater than left. There is also new atelectasis in the bilateral lower lobes, right greater than left. Hepatobiliary: There is questionable mild inflammatory stranding surrounding the gallbladder. No calcified gallstones are seen. There is no biliary ductal dilatation. The liver appears within normal limits. Pancreas: Unremarkable. No pancreatic ductal dilatation or surrounding inflammatory  changes. Spleen: Normal in size without focal abnormality. 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 The right adrenal gland is within normal limits. There are no urinary tract calculi identified. There is no hydronephrosis. There is stable bilateral nonspecific perinephric fat stranding. Bladder is decompressed. Stomach/Bowel: There is diffuse colonic diverticulosis. There is wall thickening and inflammation involving the sigmoid colon compatible with acute diverticulitis similar to the prior examination. Within the pelvis superior to the level of sigmoid colon diverticulitis there is an air-fluid collection with surrounding inflammation measuring 5.2 x 3.0 by 3.7 cm. This has increased in size in the interval. Findings are suspicious for abscess. This fluid collection abuts adjacent small bowel loops. Additionally there is wall thickening and inflammation involving diverticula in the ascending colon images 4/49 and 50 without evidence for perforation or abscess. The colon is nondilated. The appendix is within normal limits. Enteric tube tip is in the proximal stomach. Contrast distends the stomach. Duodenal diverticulum is present. Mid/proximal small bowel loops are dilated measuring up to 4.7 cm. There is no definite transition point, but distal small bowel loops are decompressed. Contrast is not enter mid or distal small bowel at this time. Vascular/Lymphatic: Aortic atherosclerosis. No enlarged abdominal or pelvic lymph nodes. Reproductive: Prostate is unremarkable. Other: There is new presacral edema. There is trace free fluid in the pelvis. There is  no focal abdominal wall hernia. There is mild body wall edema which is new from the prior examination. Musculoskeletal: There is no acute fracture or dislocation. 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 IMPRESSION: 1. Acute sigmoid colon diverticulitis changes are again noted. There is an air-fluid collection with surrounding inflammation in the pelvis adjacent to the sigmoid colon worrisome for perforation with abscess formation. This collection has increased in size now measuring 5.2 x 3.0 x 3.7 cm. 2. Acute uncomplicated ascending colon diverticulitis. 3. Dilated proximal small bowel may represent partial small bowel obstruction or ileus. 4. New small bilateral pleural effusions with bibasilar atelectasis. 5. New presacral edema and body wall edema. 6. Aortic Atherosclerosis (ICD10-I70.0). Electronically Signed   By: Ronney Asters M.D.   On: 02/23/2021 21:36   DG Abd Portable 1V  Result Date: 02/25/2021 CLINICAL DATA:  Epigastric abdominal pain. EXAM: PORTABLE ABDOMEN - 1 VIEW COMPARISON:  February 21, 2021. FINDINGS: Distal tip of nasogastric tube is seen in expected position of proximal stomach. Small bowel dilatation is noted concerning for distal small bowel obstruction or possibly ileus. Percutaneous drainage catheter is noted in the pelvis. IMPRESSION: Distal tip of nasogastric tube seen in expected position of proximal stomach. Small bowel dilatation is noted concerning for distal small bowel obstruction or possibly ileus. Electronically Signed   By: Marijo Conception M.D.   On: 02/25/2021 12:20   ECHOCARDIOGRAM COMPLETE  Result Date: 02/23/2021    ECHOCARDIOGRAM REPORT   Patient Name:   FRANCOIS OLIVA Date of Exam: 02/23/2021 Medical Rec #:  [id number]        Height:       72.0 in Accession #:    [id number]       Weight:       175.0 lb Date of Birth:  [birth year]       BSA:          2.013 m Patient Age:    68 years         BP:           142/72 mmHg Patient Gender: M                HR:           76 bpm. Exam Location:  Inpatient Procedure: 2D Echo, Cardiac Doppler and Color Doppler Indications:    a-fib  History:        Patient has prior history of Echocardiogram examinations, most                  recent 10/03/2019. Arrythmias:Atrial Fibrillation; Risk                 Factors:Hypertension.  Sonographer:    MH Referring Phys: [place]  1. Left ventricular ejection fraction, by estimation, is 60 to 65%. The left ventricle has normal function. The left ventricle has no regional wall motion abnormalities. There is moderate left ventricular hypertrophy of the basal-septal segment. Left ventricular diastolic parameters were normal.  2. Right ventricular systolic function is normal. The right ventricular size is normal. Tricuspid regurgitation signal is inadequate for assessing PA pressure.  3. The mitral valve is normal in structure. Trivial mitral valve regurgitation. No evidence of mitral stenosis.  4. The aortic valve is normal in structure. Aortic valve regurgitation is not visualized. No aortic stenosis is present. FINDINGS  Left Ventricle: Left ventricular ejection fraction, by estimation, is 60  to 65%. The left ventricle has normal function. The left ventricle has no regional wall motion abnormalities. The left ventricular internal cavity size was normal in size. There is  moderate left ventricular hypertrophy of the basal-septal segment. Left ventricular diastolic parameters were normal. Normal left ventricular filling pressure. Right Ventricle: The right ventricular size is normal. No increase in right ventricular wall thickness. Right ventricular systolic function is normal. Tricuspid regurgitation signal is inadequate for assessing PA pressure. Left Atrium: Left atrial size was normal in size. Right Atrium: Right atrial size was normal in size. Pericardium: There is no evidence of pericardial effusion. Mitral Valve: The mitral valve is normal in structure. Trivial mitral valve regurgitation. No evidence of mitral valve stenosis. Tricuspid Valve: The tricuspid valve is normal in structure. Tricuspid valve regurgitation is not demonstrated. No evidence of tricuspid stenosis.  Aortic Valve: The aortic valve is normal in structure. Aortic valve regurgitation is not visualized. No aortic stenosis is present. Aortic valve mean gradient measures 3.0 mmHg. Aortic valve peak gradient measures 3.9 mmHg. Aortic valve area, by VTI measures 2.58 cm. Pulmonic Valve: The pulmonic valve was normal in structure. Pulmonic valve regurgitation is not visualized. No evidence of pulmonic stenosis. Aorta: The aortic root is normal in size and structure. Venous: The inferior vena cava was not well visualized. IAS/Shunts: No atrial level shunt detected by color flow Doppler.  LEFT VENTRICLE PLAX 2D LVIDd:         3.90 cm     Diastology LVIDs:         2.80 cm     LV e' medial:    8.81 cm/s LV PW:         1.00 cm     LV E/e' medial:  9.1 LV IVS:        1.50 cm     LV e' lateral:   9.03 cm/s LVOT diam:     2.00 cm     LV E/e' lateral: 8.9 LV SV:         62 LV SV Index:   31 LVOT Area:     3.14 cm  LV Volumes (MOD) LV vol d, MOD A4C: 54.3 ml LV vol s, MOD A4C: 20.0 ml LV SV MOD A4C:     54.3 ml RIGHT VENTRICLE RV S prime:     13.20 cm/s TAPSE (M-mode): 2.2 cm LEFT ATRIUM             Index       RIGHT ATRIUM           Index LA diam:        3.60 cm 1.79 cm/m  RA Area:     13.40 cm LA Vol (A2C):   37.0 ml 18.38 ml/m RA Volume:   29.60 ml  14.70 ml/m LA Vol (A4C):   41.9 ml 20.81 ml/m LA Biplane Vol: 41.3 ml 20.51 ml/m  AORTIC VALVE AV Area (Vmax):    2.56 cm AV Area (Vmean):   2.26 cm AV Area (VTI):     2.58 cm AV Vmax:           99.00 cm/s AV Vmean:          77.000 cm/s AV VTI:            0.240 m AV Peak Grad:      3.9 mmHg AV Mean Grad:      3.0 mmHg LVOT Vmax:         80.80 cm/s LVOT Vmean:  55.300 cm/s LVOT VTI:          0.197 m LVOT/AV VTI ratio: 0.82  AORTA Ao Root diam: 2.60 cm MITRAL VALVE MV Area (PHT): 5.37 cm    SHUNTS MV E velocity: 80.50 cm/s  Systemic VTI:  0.20 m MV A velocity: 50.90 cm/s  Systemic Diam: 2.00 cm MV E/A ratio:  1.58 Fransico Him MD Electronically signed by Fransico Him  MD Signature Date/Time: 02/23/2021/1:39:47 PM    Final    CT IMAGE GUIDED DRAINAGE BY PERCUTANEOUS CATHETER  Result Date: 02/24/2021 INDICATION: Perforated diverticulitis EXAM: CT IMAGE GUIDED DRAINAGE BY PERCUTANEOUS CATHETER COMPARISON:  CT Abdomen Pelvis, 02/23/2021. MEDICATIONS: The patient is currently admitted to the hospital and receiving intravenous antibiotics. The antibiotics were administered within an appropriate time frame prior to the initiation of the procedure. ANESTHESIA/SEDATION: Moderate (conscious) sedation was employed during this procedure. A total of Versed 2 mg and Fentanyl 100 mcg was administered intravenously. Moderate Sedation Time: 25 minutes. The patient's level of consciousness and vital signs were monitored continuously by radiology nursing throughout the procedure under my direct supervision. CONTRAST:  None COMPLICATIONS: None immediate. PROCEDURE: Informed written consent was obtained from the patient after a discussion of the risks, benefits and alternatives to treatment. The patient was placed supine on the CT gantry and a pre procedural CT was performed re-demonstrating the known abscess/fluid collection within the anterior pelvis. The procedure was planned. A timeout was performed prior to the initiation of the procedure. The LEFT lower quadrant was prepped and draped in the usual sterile fashion. The overlying soft tissues were anesthetized with 1% lidocaine with epinephrine. Appropriate trajectory was planned with the use of a 22 gauge spinal needle. An 18 gauge trocar needle was advanced into the abscess/fluid collection and a short Amplatz super stiff wire was coiled within the collection. Appropriate positioning was confirmed with a limited CT scan. The tract was serially dilated allowing placement of a 12 [place] all-purpose drainage catheter. Appropriate positioning was confirmed with a limited postprocedural CT scan. 15 mL of feculent fluid was aspirated. The tube was  connected to a bulb suction and sutured in place. A dressing was placed. The patient tolerated the procedure well without immediate post procedural complication. IMPRESSION: Successful CT guided placement of a 12 French drainage catheter into the anterior pelvic collection for perforated diverticulitis, as above. Samples were sent to the laboratory as requested by the ordering clinical team. Michaelle Birks, MD [facility] Electronically Signed   By: Michaelle Birks M.D.   On: 02/24/2021 16:01   [place] EKG SITE RITE  Result Date: 02/24/2021 If Site Rite image not attached, placement could not be confirmed due to current cardiac rhythm.    LOS: 5 days   Oren Binet, MD  [facility]    To contact the attending provider between 7A-7P or the covering provider during after hours 7P-7A, please log into the web site [email or web address] and access using universal [place] password for that web site. If you do not have the password, please call the hospital operator.  02/25/2021, 1:02 PM

## 2021-02-25 NOTE — Anesthesia Preprocedure Evaluation (Addendum)
Anesthesia Evaluation  Patient identified by MRN, date of birth, ID band Patient awake    Reviewed: Allergy & Precautions, NPO status , Patient's Chart, lab work & pertinent test results  Airway Mallampati: II  TM Distance: >3 FB Neck ROM: Full    Dental  (+) Teeth Intact, Dental Advisory Given   Pulmonary Current Smoker,    breath sounds clear to auscultation       Cardiovascular hypertension, Pt. on medications + dysrhythmias Atrial Fibrillation  Rhythm:Regular Rate:Normal     Neuro/Psych negative neurological ROS  negative psych ROS   GI/Hepatic negative GI ROS, Neg liver ROS,   Endo/Other  negative endocrine ROS  Renal/GU Renal disease     Musculoskeletal   Abdominal Normal abdominal exam  (+)   Peds  Hematology negative hematology ROS (+)   Anesthesia Other Findings NGT in place  Reproductive/Obstetrics                            Anesthesia Physical Anesthesia Plan  ASA: 3  Anesthesia Plan: General   Post-op Pain Management:    Induction: Intravenous  PONV Risk Score and Plan: 2 and Ondansetron, Dexamethasone and Treatment may vary due to age or medical condition  Airway Management Planned: Oral ETT  Additional Equipment: None  Intra-op Plan:   Post-operative Plan: Extubation in OR  Informed Consent: I have reviewed the patients History and Physical, chart, labs and discussed the procedure including the risks, benefits and alternatives for the proposed anesthesia with the patient or authorized representative who has indicated his/her understanding and acceptance.     Dental advisory given  Plan Discussed with: CRNA  Anesthesia Plan Comments:        Anesthesia Quick Evaluation

## 2021-02-25 NOTE — Progress Notes (Signed)
General Surgery Follow Up Note  Subjective:    Overnight Issues:   Objective:  Vital signs for last 24 hours: Temp:  [98 F (36.7 C)-99 F (37.2 C)] 99 F (37.2 C) (09/09 0800) Pulse Rate:  [83-120] 84 (09/09 0800) Resp:  [15-23] 15 (09/09 0800) BP: (108-145)/(54-80) 130/73 (09/09 0800) SpO2:  [91 %-99 %] 93 % (09/09 0800)  Hemodynamic parameters for last 24 hours:    Intake/Output from previous day: 09/08 0701 - 09/09 0700 In: 1329.2 [I.V.:1200.1; IV Piggyback:129.1] Out: [id number] [Urine:1750; Emesis/NG output:1150; Drains:27]  Intake/Output this shift: Total I/O In: -  Out: 30 [Drains:30]  Vent settings for last 24 hours:    Physical Exam:  Gen: comfortable, no distress Neuro: non-focal exam HEENT: PERRL Neck: supple CV: RRR Pulm: unlabored breathing Abd: soft, NT, JP LLQ with minimal dark bloody o/p GU: clear yellow urine Extr: wwp, no edema   Results for orders placed or performed during the hospital encounter of 02/19/21 (from the past 24 hour(s))  Glucose, capillary     Status: None   Collection Time: 02/24/21 11:45 AM  Result Value Ref Range   Glucose-Capillary 87 70 - 99 mg/dL  Aerobic/Anaerobic Culture w Gram Stain (surgical/deep wound)     Status: None (Preliminary result)   Collection Time: 02/24/21  3:14 PM   Specimen: Abdomen; Abscess  Result Value Ref Range   Specimen Description ABDOMEN    Special Requests NONE    Gram Stain      FEW SQUAMOUS EPITHELIAL CELLS PRESENT ABUNDANT WBC PRESENT,BOTH PMN AND MONONUCLEAR FEW GRAM POSITIVE COCCI FEW GRAM POSITIVE RODS    Culture      CULTURE REINCUBATED FOR BETTER GROWTH Performed at [facility], [place] [address], [place], [place] [address]    Report Status PENDING   Glucose, capillary     Status: None   Collection Time: 02/24/21  4:24 PM  Result Value Ref Range   Glucose-Capillary 94 70 - 99 mg/dL  Glucose, capillary     Status: None   Collection Time: 02/24/21  8:06 PM  Result Value  Ref Range   Glucose-Capillary 81 70 - 99 mg/dL  Glucose, capillary     Status: None   Collection Time: 02/24/21 11:09 PM  Result Value Ref Range   Glucose-Capillary 83 70 - 99 mg/dL  CBC     Status: Abnormal   Collection Time: 02/25/21  1:44 AM  Result Value Ref Range   WBC 15.6 (H) 4.0 - 10.5 K/uL   RBC 3.91 (L) 4.22 - 5.81 MIL/uL   Hemoglobin 13.8 13.0 - 17.0 g/dL   HCT 39.5 39.0 - 52.0 %   MCV 101.0 (H) 80.0 - 100.0 fL   MCH 35.3 (H) 26.0 - 34.0 pg   MCHC 34.9 30.0 - 36.0 g/dL   RDW 13.2 11.5 - 15.5 %   Platelets 337 150 - 400 K/uL   nRBC 0.0 0.0 - 0.2 %  Heparin level (unfractionated)     Status: Abnormal   Collection Time: 02/25/21  1:44 AM  Result Value Ref Range   Heparin Unfractionated 0.23 (L) 0.30 - 0.70 IU/mL  Magnesium     Status: None   Collection Time: 02/25/21  1:44 AM  Result Value Ref Range   Magnesium 1.8 1.7 - 2.4 mg/dL  Comprehensive metabolic panel     Status: Abnormal   Collection Time: 02/25/21  1:44 AM  Result Value Ref Range   Sodium 135 135 - 145 mmol/L  Potassium 4.0 3.5 - 5.1 mmol/L   Chloride 98 98 - 111 mmol/L   CO2 26 22 - 32 mmol/L   Glucose, Bld 91 70 - 99 mg/dL   BUN 11 8 - 23 mg/dL   Creatinine, Ser 1.22 0.61 - 1.24 mg/dL   Calcium 8.3 (L) 8.9 - 10.3 mg/dL   Total Protein 5.3 (L) 6.5 - 8.1 g/dL   Albumin 2.0 (L) 3.5 - 5.0 g/dL   AST 12 (L) 15 - 41 U/L   ALT 12 0 - 44 U/L   Alkaline Phosphatase 50 38 - 126 U/L   Total Bilirubin 1.1 0.3 - 1.2 mg/dL   GFR, Estimated >60 >60 mL/min   Anion gap 11 5 - 15  Phosphorus     Status: None   Collection Time: 02/25/21  1:44 AM  Result Value Ref Range   Phosphorus 3.1 2.5 - 4.6 mg/dL  Triglycerides     Status: None   Collection Time: 02/25/21  1:44 AM  Result Value Ref Range   Triglycerides 82 <150 mg/dL  Glucose, capillary     Status: None   Collection Time: 02/25/21  3:28 AM  Result Value Ref Range   Glucose-Capillary 98 70 - 99 mg/dL  Glucose, capillary     Status: Abnormal    Collection Time: 02/25/21  8:02 AM  Result Value Ref Range   Glucose-Capillary 109 (H) 70 - 99 mg/dL    Assessment & Plan:  Present on Admission:  Atrial fibrillation, chronic (HCC)  Diverticulitis of large intestine with perforation and abscess without bleeding  Essential hypertension  Diverticulitis with obstruction (HCC)  AKI (acute kidney injury) ([place])  Severe sepsis with acute organ dysfunction ([place])    LOS: 5 days   Additional comments:I reviewed the patient's new clinical lab test results.   and I reviewed the patients new imaging test results.    Acute sigmoid diverticulitis with contained perforation and abscess -repeat CT 9/7 with increased size of abscess, s/p IR drain 9/8 -cont abx therapy -NGT replaced 9/5. Output 1.1L during day shift, no o/p recorded overnight. Continue NGT to LIWS and NPO except for sips/chips. Bowel remains dilated on film today. Decision regarding surgery to be held in AM based on AM AXR and NGT o/p. Already posted for exlap, partial colectomy, colostomy, any other necessary and indicated procedures. Discussed with patient and wife extensively.  - WBC 15.6, stable - mobilize - can clamp NGT to ambulate - hopeful for improvement without surgical intervention, but suspect this will not be possible.Will need c-scope in about 6 weeks after resolution if able to be managed non-operatively.   FEN - NGT LIWS, NPO sips/chips, IVF VTE - heparin gtt ID - zosyn   A fib AKI Hypokalemia HTN ETOH withdrawal    Jesusita Oka, MD Trauma & General Surgery Please use [email or web address] to contact on call provider  02/25/2021  *Care during the described time interval was provided by me. I have reviewed this patient's available data, including medical history, events of note, physical examination and test results as part of my evaluation.

## 2021-02-25 NOTE — Progress Notes (Signed)
Pharmacy Antibiotic Note  Travis Conrad is a 70 y.o. male admitted on 02/19/2021 with  IAI .  Pharmacy was consulted on 02/19/21 for zosyn dosing for IAI/severe sepsis 2/2 diverticulitis- contained perforation and small abscess.  Day # 6 Zosyn (use D1 9/4) for severe sepsis 2/2 diverticulitis- contained perforation and small abscess Surgery following- no plans for OR as of now. 9/7 repeat CT scan shows increasing size of pelvic abscess.  9/8 s/p IR CT-guided drain placement into LLQ abscess WBC 21.8>>>14> 15.6. Afebrile ,  blood cultures negative.  Scr trended down , up slighlty today to 1.22,  CrCl ~62 ml/min.  PICC placed 9/8  Plan: Continue Zosyn 3.375g IV q8h (4 hour infusion). -Monitor renal function, clinical status, and antibiotic plan duration.  Height: 6' (182.9 cm) Weight: 79.4 kg (175 lb 0.7 oz) IBW/kg (Calculated) : 77.6  Temp (24hrs), Avg:98.3 F (36.8 C), Min:98 F (36.7 C), Max:99 F (37.2 C)  Recent Labs  Lab 02/19/21 1838 02/20/21 0549 02/20/21 0920 02/21/21 0119 02/21/21 1101 02/22/21 0022 02/22/21 1619 02/23/21 0120 02/24/21 0356 02/25/21 0144  WBC  --    < >  --    < > 13.9* 12.8*  --  16.1* 14.6* 15.6*  CREATININE  --    < >  --    < >  --  1.43* 1.36* 1.32* 1.01 1.22  LATICACIDVEN 1.3  --  1.5  --   --   --   --   --   --   --    < > = values in this interval not displayed.     Estimated Creatinine Clearance: 61.8 mL/min (by C-G formula based on SCr of 1.22 mg/dL).    Allergies  Allergen Reactions   Atorvastatin Other (See Comments)    myalgias    Antimicrobials this admission: Zosyn 9/3 >>   Microbiology results: 9/3 Blood cultures x2: Negative 9/3 covid: neg; flu: neg  Thank you for allowing pharmacy to be a part of this patient's care.  Nicole Cella, RPh Clinical Pharmacist [phone] Please check AMION for all [facility] Pharmacy phone numbers After 10:00 PM, call [place] [phone]

## 2021-02-26 ENCOUNTER — Encounter (HOSPITAL_COMMUNITY): Payer: Self-pay | Admitting: Internal Medicine

## 2021-02-26 ENCOUNTER — Inpatient Hospital Stay (HOSPITAL_COMMUNITY): Payer: Medicare Other | Admitting: Anesthesiology

## 2021-02-26 ENCOUNTER — Inpatient Hospital Stay (HOSPITAL_COMMUNITY): Payer: Medicare Other

## 2021-02-26 ENCOUNTER — Encounter (HOSPITAL_COMMUNITY)
Admission: EM | Disposition: A | Discharge status: Rehabilitation Facility | Payer: Self-pay | Source: Home / Self Care | Attending: Internal Medicine

## 2021-02-26 DIAGNOSIS — K5720 Diverticulitis of large intestine with perforation and abscess without bleeding: Secondary | ICD-10-CM | POA: Diagnosis not present

## 2021-02-26 HISTORY — PX: COLECTOMY: SHX59

## 2021-02-26 HISTORY — PX: LAPAROTOMY: SHX154

## 2021-02-26 LAB — BASIC METABOLIC PANEL
Anion gap: 7 (ref 5–15)
BUN: 14 mg/dL (ref 8–23)
CO2: 28 mmol/L (ref 22–32)
Calcium: 8.2 mg/dL — ABNORMAL LOW (ref 8.9–10.3)
Chloride: 99 mmol/L (ref 98–111)
Creatinine, Ser: 1.07 mg/dL (ref 0.61–1.24)
GFR, Estimated: >60 mL/min (ref >60)
Glucose, Bld: 179 mg/dL — ABNORMAL HIGH (ref 70–99)
Potassium: 3.9 mmol/L (ref 3.5–5.1)
Sodium: 134 mmol/L — ABNORMAL LOW (ref 135–145)

## 2021-02-26 LAB — CBC
HCT: 37.3 % — ABNORMAL LOW (ref 39.0–52.0)
Hemoglobin: 12.9 g/dL — ABNORMAL LOW (ref 13.0–17.0)
MCH: 34.9 pg — ABNORMAL HIGH (ref 26.0–34.0)
MCHC: 34.6 g/dL (ref 30.0–36.0)
MCV: 100.8 fL — ABNORMAL HIGH (ref 80.0–100.0)
Platelets: 334 10*3/uL (ref 150–400)
RBC: 3.7 MIL/uL — ABNORMAL LOW (ref 4.22–5.81)
RDW: 13.2 % (ref 11.5–15.5)
WBC: 16.6 10*3/uL — ABNORMAL HIGH (ref 4.0–10.5)
nRBC: 0 % (ref 0.0–0.2)

## 2021-02-26 LAB — TYPE AND SCREEN
ABO/RH(D): A POS
Antibody Screen: NEGATIVE

## 2021-02-26 LAB — GLUCOSE, CAPILLARY
Glucose-Capillary: 131 mg/dL — ABNORMAL HIGH (ref 70–99)
Glucose-Capillary: 181 mg/dL — ABNORMAL HIGH (ref 70–99)
Glucose-Capillary: 184 mg/dL — ABNORMAL HIGH (ref 70–99)
Glucose-Capillary: 194 mg/dL — ABNORMAL HIGH (ref 70–99)
Glucose-Capillary: 206 mg/dL — ABNORMAL HIGH (ref 70–99)
Glucose-Capillary: 210 mg/dL — ABNORMAL HIGH (ref 70–99)
Glucose-Capillary: 214 mg/dL — ABNORMAL HIGH (ref 70–99)

## 2021-02-26 LAB — PHOSPHORUS: Phosphorus: 2.3 mg/dL — ABNORMAL LOW (ref 2.5–4.6)

## 2021-02-26 LAB — MAGNESIUM: Magnesium: 2 mg/dL (ref 1.7–2.4)

## 2021-02-26 SURGERY — COLECTOMY, TOTAL
Anesthesia: General | Site: Abdomen

## 2021-02-26 MED ORDER — MORPHINE SULFATE (PF) 2 MG/ML IV SOLN
2.0000 mg | INTRAVENOUS | Status: DC | PRN
  Administered 2021-02-26: 4 mg via INTRAVENOUS
  Administered 2021-02-26: 2 mg via INTRAVENOUS
  Administered 2021-02-26 – 2021-03-02 (×19): 4 mg via INTRAVENOUS
  Administered 2021-03-02: 2 mg via INTRAVENOUS
  Administered 2021-03-02 (×2): 4 mg via INTRAVENOUS
  Administered 2021-03-02: 2 mg via INTRAVENOUS
  Administered 2021-03-03 – 2021-03-04 (×5): 4 mg via INTRAVENOUS
  Administered 2021-03-04: 2 mg via INTRAVENOUS
  Administered 2021-03-04 (×2): 4 mg via INTRAVENOUS
  Administered 2021-03-04 (×2): 2 mg via INTRAVENOUS
  Administered 2021-03-05 – 2021-03-06 (×8): 4 mg via INTRAVENOUS
  Administered 2021-03-06: 2 mg via INTRAVENOUS
  Administered 2021-03-07 (×5): 4 mg via INTRAVENOUS
  Administered 2021-03-07: 2 mg via INTRAVENOUS
  Administered 2021-03-08 – 2021-03-10 (×14): 4 mg via INTRAVENOUS
  Administered 2021-03-10 – 2021-03-11 (×3): 2 mg via INTRAVENOUS
  Administered 2021-03-11 – 2021-03-16 (×24): 4 mg via INTRAVENOUS
  Filled 2021-02-26 (×3): qty 2
  Filled 2021-02-26: qty 1
  Filled 2021-02-26 (×20): qty 2
  Filled 2021-02-26: qty 1
  Filled 2021-02-26 (×3): qty 2
  Filled 2021-02-26: qty 1
  Filled 2021-02-26: qty 2
  Filled 2021-02-26: qty 1
  Filled 2021-02-26 (×7): qty 2
  Filled 2021-02-26 (×2): qty 1
  Filled 2021-02-26 (×3): qty 2
  Filled 2021-02-26: qty 1
  Filled 2021-02-26 (×17): qty 2
  Filled 2021-02-26: qty 1
  Filled 2021-02-26: qty 2
  Filled 2021-02-26: qty 1
  Filled 2021-02-26 (×2): qty 2
  Filled 2021-02-26: qty 1
  Filled 2021-02-26 (×7): qty 2
  Filled 2021-02-26: qty 1
  Filled 2021-02-26 (×5): qty 2
  Filled 2021-02-26: qty 1
  Filled 2021-02-26 (×14): qty 2

## 2021-02-26 MED ORDER — MEPERIDINE HCL 25 MG/ML IJ SOLN: 6.2500 mg | INTRAMUSCULAR | Status: DC | PRN

## 2021-02-26 MED ORDER — PHENYLEPHRINE HCL-NACL 20-0.9 MG/250ML-% IV SOLN
INTRAVENOUS | Status: DC | PRN
  Administered 2021-02-26: 20 ug/min via INTRAVENOUS

## 2021-02-26 MED ORDER — 0.9 % SODIUM CHLORIDE (POUR BTL) OPTIME
TOPICAL | Status: DC | PRN
  Administered 2021-02-26: 1000 mL

## 2021-02-26 MED ORDER — POTASSIUM PHOSPHATES 15 MMOLE/5ML IV SOLN
30.0000 mmol | Freq: Once | INTRAVENOUS | Status: DC
  Filled 2021-02-26: qty 10

## 2021-02-26 MED ORDER — DIPHENHYDRAMINE HCL 50 MG/ML IJ SOLN
50.0000 mg | Freq: Once | INTRAMUSCULAR | Status: AC
  Administered 2021-02-26: 50 mg via INTRAVENOUS
  Filled 2021-02-26: qty 1

## 2021-02-26 MED ORDER — FENTANYL CITRATE (PF) 250 MCG/5ML IJ SOLN
INTRAMUSCULAR | Status: DC | PRN
  Administered 2021-02-26: 50 ug via INTRAVENOUS
  Administered 2021-02-26 (×3): 25 ug via INTRAVENOUS
  Administered 2021-02-26: 125 ug via INTRAVENOUS

## 2021-02-26 MED ORDER — AMISULPRIDE (ANTIEMETIC) 5 MG/2ML IV SOLN: 10.0000 mg | Freq: Once | INTRAVENOUS | Status: DC | PRN

## 2021-02-26 MED ORDER — METOPROLOL TARTRATE 5 MG/5ML IV SOLN
5.0000 mg | Freq: Three times a day (TID) | INTRAVENOUS | Status: DC | PRN
  Administered 2021-02-28 – 2021-03-01 (×2): 5 mg via INTRAVENOUS
  Filled 2021-02-26 (×3): qty 5

## 2021-02-26 MED ORDER — HYDROMORPHONE HCL 1 MG/ML IJ SOLN
INTRAMUSCULAR | Status: AC
  Filled 2021-02-26: qty 1

## 2021-02-26 MED ORDER — INSULIN ASPART 100 UNIT/ML IJ SOLN
0.0000 [IU] | INTRAMUSCULAR | Status: DC
  Administered 2021-02-26 (×2): 5 [IU] via SUBCUTANEOUS
  Administered 2021-02-27 (×2): 2 [IU] via SUBCUTANEOUS
  Administered 2021-02-27: 3 [IU] via SUBCUTANEOUS
  Administered 2021-02-27: 2 [IU] via SUBCUTANEOUS
  Administered 2021-02-27 (×2): 3 [IU] via SUBCUTANEOUS
  Administered 2021-02-28 (×3): 2 [IU] via SUBCUTANEOUS
  Administered 2021-02-28: 3 [IU] via SUBCUTANEOUS
  Administered 2021-03-01: 2 [IU] via SUBCUTANEOUS
  Administered 2021-03-01 (×2): 3 [IU] via SUBCUTANEOUS
  Administered 2021-03-01 (×2): 2 [IU] via SUBCUTANEOUS
  Administered 2021-03-01 – 2021-03-02 (×5): 3 [IU] via SUBCUTANEOUS
  Administered 2021-03-02 – 2021-03-03 (×3): 2 [IU] via SUBCUTANEOUS
  Administered 2021-03-03 (×3): 3 [IU] via SUBCUTANEOUS
  Administered 2021-03-03: 2 [IU] via SUBCUTANEOUS
  Administered 2021-03-03 (×2): 3 [IU] via SUBCUTANEOUS
  Administered 2021-03-04 (×5): 2 [IU] via SUBCUTANEOUS
  Administered 2021-03-05: 3 [IU] via SUBCUTANEOUS
  Administered 2021-03-05: 2 [IU] via SUBCUTANEOUS
  Administered 2021-03-05: 3 [IU] via SUBCUTANEOUS
  Administered 2021-03-05 (×2): 2 [IU] via SUBCUTANEOUS
  Administered 2021-03-05: 3 [IU] via SUBCUTANEOUS
  Administered 2021-03-06 – 2021-03-07 (×12): 2 [IU] via SUBCUTANEOUS
  Administered 2021-03-08 (×4): 3 [IU] via SUBCUTANEOUS
  Administered 2021-03-08 (×2): 2 [IU] via SUBCUTANEOUS
  Administered 2021-03-08: 3 [IU] via SUBCUTANEOUS
  Administered 2021-03-09 (×3): 2 [IU] via SUBCUTANEOUS
  Administered 2021-03-09 (×2): 3 [IU] via SUBCUTANEOUS
  Administered 2021-03-10 – 2021-03-11 (×9): 2 [IU] via SUBCUTANEOUS
  Administered 2021-03-12: 3 [IU] via SUBCUTANEOUS
  Administered 2021-03-12 – 2021-03-13 (×5): 2 [IU] via SUBCUTANEOUS
  Administered 2021-03-13: 3 [IU] via SUBCUTANEOUS
  Administered 2021-03-13 – 2021-03-14 (×5): 2 [IU] via SUBCUTANEOUS
  Administered 2021-03-14: 3 [IU] via SUBCUTANEOUS
  Administered 2021-03-14 – 2021-03-15 (×3): 2 [IU] via SUBCUTANEOUS
  Administered 2021-03-15: 3 [IU] via SUBCUTANEOUS
  Administered 2021-03-15 – 2021-03-16 (×4): 2 [IU] via SUBCUTANEOUS
  Administered 2021-03-16 (×2): 3 [IU] via SUBCUTANEOUS
  Administered 2021-03-17 – 2021-03-18 (×9): 2 [IU] via SUBCUTANEOUS
  Administered 2021-03-19: 3 [IU] via SUBCUTANEOUS
  Administered 2021-03-19 (×2): 2 [IU] via SUBCUTANEOUS
  Administered 2021-03-20 (×2): 3 [IU] via SUBCUTANEOUS
  Administered 2021-03-20 – 2021-03-21 (×3): 2 [IU] via SUBCUTANEOUS
  Administered 2021-03-21: 5 [IU] via SUBCUTANEOUS
  Administered 2021-03-21: 3 [IU] via SUBCUTANEOUS
  Administered 2021-03-21: 5 [IU] via SUBCUTANEOUS
  Administered 2021-03-21: 3 [IU] via SUBCUTANEOUS
  Administered 2021-03-21: 2 [IU] via SUBCUTANEOUS
  Administered 2021-03-22 (×2): 3 [IU] via SUBCUTANEOUS
  Administered 2021-03-22: 5 [IU] via SUBCUTANEOUS
  Administered 2021-03-22: 3 [IU] via SUBCUTANEOUS
  Administered 2021-03-22: 5 [IU] via SUBCUTANEOUS
  Administered 2021-03-23: 2 [IU] via SUBCUTANEOUS
  Administered 2021-03-23 – 2021-03-24 (×7): 3 [IU] via SUBCUTANEOUS
  Administered 2021-03-24: 2 [IU] via SUBCUTANEOUS
  Administered 2021-03-24: 3 [IU] via SUBCUTANEOUS
  Administered 2021-03-24: 2 [IU] via SUBCUTANEOUS
  Administered 2021-03-24: 3 [IU] via SUBCUTANEOUS
  Administered 2021-03-25 (×2): 2 [IU] via SUBCUTANEOUS
  Administered 2021-03-25: 3 [IU] via SUBCUTANEOUS
  Administered 2021-03-25 – 2021-03-26 (×6): 2 [IU] via SUBCUTANEOUS
  Administered 2021-03-26 (×2): 3 [IU] via SUBCUTANEOUS
  Administered 2021-03-26: 2 [IU] via SUBCUTANEOUS
  Administered 2021-03-26 – 2021-03-27 (×3): 3 [IU] via SUBCUTANEOUS
  Administered 2021-03-27 – 2021-03-28 (×5): 2 [IU] via SUBCUTANEOUS
  Filled 2021-02-26: qty 0.15

## 2021-02-26 MED ORDER — CHLORHEXIDINE GLUCONATE 0.12 % MT SOLN
OROMUCOSAL | Status: AC
  Administered 2021-02-26: 15 mL via OROMUCOSAL
  Filled 2021-02-26: qty 15

## 2021-02-26 MED ORDER — PROPOFOL 10 MG/ML IV BOLUS
INTRAVENOUS | Status: DC | PRN
  Administered 2021-02-26: 140 mg via INTRAVENOUS

## 2021-02-26 MED ORDER — ACETAMINOPHEN 160 MG/5ML PO SOLN: 325.0000 mg | Freq: Once | ORAL | Status: DC | PRN

## 2021-02-26 MED ORDER — TRAVASOL 10 % IV SOLN
INTRAVENOUS | Status: AC
  Filled 2021-02-26: qty 600

## 2021-02-26 MED ORDER — PROMETHAZINE HCL 25 MG/ML IJ SOLN: 6.2500 mg | INTRAMUSCULAR | Status: DC | PRN

## 2021-02-26 MED ORDER — HYDROMORPHONE HCL 1 MG/ML IJ SOLN
0.2500 mg | INTRAMUSCULAR | Status: DC | PRN
  Administered 2021-02-26 (×4): 0.5 mg via INTRAVENOUS

## 2021-02-26 MED ORDER — HYDRALAZINE HCL 20 MG/ML IJ SOLN
10.0000 mg | Freq: Four times a day (QID) | INTRAMUSCULAR | Status: DC | PRN
  Administered 2021-02-26 – 2021-03-01 (×4): 10 mg via INTRAVENOUS
  Filled 2021-02-26 (×5): qty 1

## 2021-02-26 MED ORDER — CHLORHEXIDINE GLUCONATE 0.12 % MT SOLN: 15.0000 mL | Freq: Once | OROMUCOSAL | Status: AC

## 2021-02-26 MED ORDER — DEXMEDETOMIDINE (PRECEDEX) IN NS 20 MCG/5ML (4 MCG/ML) IV SYRINGE
PREFILLED_SYRINGE | INTRAVENOUS | Status: DC | PRN
  Administered 2021-02-26 (×2): 8 ug via INTRAVENOUS
  Administered 2021-02-26: 4 ug via INTRAVENOUS

## 2021-02-26 MED ORDER — DEXAMETHASONE SODIUM PHOSPHATE 10 MG/ML IJ SOLN
INTRAMUSCULAR | Status: DC | PRN
Start: 2021-02-26 — End: 2021-02-26
  Administered 2021-02-26: 5 mg via INTRAVENOUS

## 2021-02-26 MED ORDER — ONDANSETRON HCL 4 MG/2ML IJ SOLN
INTRAMUSCULAR | Status: DC | PRN
  Administered 2021-02-26: 4 mg via INTRAVENOUS

## 2021-02-26 MED ORDER — ORAL CARE MOUTH RINSE: 15.0000 mL | Freq: Once | OROMUCOSAL | Status: AC

## 2021-02-26 MED ORDER — LIDOCAINE 2% (20 MG/ML) 5 ML SYRINGE
INTRAMUSCULAR | Status: DC | PRN
  Administered 2021-02-26: 40 mg via INTRAVENOUS

## 2021-02-26 MED ORDER — OXYCODONE HCL 5 MG PO TABS
5.0000 mg | ORAL_TABLET | Freq: Four times a day (QID) | ORAL | Status: DC | PRN
  Administered 2021-02-26 – 2021-02-27 (×2): 5 mg via ORAL
  Filled 2021-02-26 (×2): qty 1

## 2021-02-26 MED ORDER — SODIUM CHLORIDE 0.9 % IV SOLN
100.0000 mg | Freq: Two times a day (BID) | INTRAVENOUS | Status: AC
  Administered 2021-02-26 – 2021-03-08 (×22): 100 mg via INTRAVENOUS
  Filled 2021-02-26 (×23): qty 100

## 2021-02-26 MED ORDER — HYDROMORPHONE HCL 1 MG/ML IJ SOLN
0.5000 mg | INTRAMUSCULAR | Status: DC | PRN
  Administered 2021-02-26 (×2): 1 mg via INTRAVENOUS
  Filled 2021-02-26: qty 1

## 2021-02-26 MED ORDER — LACTATED RINGERS IV SOLN: INTRAVENOUS | Status: DC | PRN

## 2021-02-26 MED ORDER — ESMOLOL HCL 100 MG/10ML IV SOLN
INTRAVENOUS | Status: DC | PRN
  Administered 2021-02-26 (×2): 20 mg via INTRAVENOUS

## 2021-02-26 MED ORDER — LACTATED RINGERS IV SOLN: INTRAVENOUS | Status: DC

## 2021-02-26 MED ORDER — ACETAMINOPHEN 325 MG PO TABS: 325.0000 mg | ORAL_TABLET | Freq: Once | ORAL | Status: DC | PRN

## 2021-02-26 MED ORDER — METOPROLOL TARTRATE 5 MG/5ML IV SOLN
INTRAVENOUS | Status: AC
  Filled 2021-02-26: qty 5

## 2021-02-26 MED ORDER — SODIUM CHLORIDE 0.9% FLUSH
10.0000 mL | Freq: Two times a day (BID) | INTRAVENOUS | Status: DC
  Administered 2021-02-27 – 2021-04-06 (×69): 10 mL

## 2021-02-26 MED ORDER — FENTANYL CITRATE (PF) 250 MCG/5ML IJ SOLN
INTRAMUSCULAR | Status: AC
  Filled 2021-02-26: qty 5

## 2021-02-26 MED ORDER — ACETAMINOPHEN 10 MG/ML IV SOLN: 1000.0000 mg | Freq: Once | INTRAVENOUS | Status: DC | PRN

## 2021-02-26 MED ORDER — SUGAMMADEX SODIUM 200 MG/2ML IV SOLN
INTRAVENOUS | Status: DC | PRN
  Administered 2021-02-26: 200 mg via INTRAVENOUS

## 2021-02-26 MED ORDER — PHENYLEPHRINE 40 MCG/ML (10ML) SYRINGE FOR IV PUSH (FOR BLOOD PRESSURE SUPPORT)
PREFILLED_SYRINGE | INTRAVENOUS | Status: DC | PRN
  Administered 2021-02-26: 120 ug via INTRAVENOUS
  Administered 2021-02-26 (×2): 80 ug via INTRAVENOUS

## 2021-02-26 MED ORDER — SUCCINYLCHOLINE CHLORIDE 200 MG/10ML IV SOSY
PREFILLED_SYRINGE | INTRAVENOUS | Status: DC | PRN
  Administered 2021-02-26: 120 mg via INTRAVENOUS

## 2021-02-26 MED ORDER — ROCURONIUM BROMIDE 10 MG/ML (PF) SYRINGE
PREFILLED_SYRINGE | INTRAVENOUS | Status: DC | PRN
  Administered 2021-02-26: 50 mg via INTRAVENOUS
  Administered 2021-02-26: 20 mg via INTRAVENOUS

## 2021-02-26 MED ORDER — METOPROLOL TARTRATE 5 MG/5ML IV SOLN
2.0000 mg | Freq: Once | INTRAVENOUS | Status: AC
  Administered 2021-02-26: 2 mg via INTRAVENOUS

## 2021-02-26 SURGICAL SUPPLY — 60 items
APL PRP STRL LF DISP 70% ISPRP (MISCELLANEOUS) ×2
BAG COUNTER SPONGE SURGICOUNT (BAG) ×3 IMPLANT
BAG SPNG CNTER NS LX DISP (BAG) ×2
BLADE CLIPPER SURG (BLADE) IMPLANT
CANISTER SUCT 3000ML PPV (MISCELLANEOUS) ×3 IMPLANT
CHLORAPREP W/TINT 26 (MISCELLANEOUS) ×3 IMPLANT
COVER MAYO STAND STRL (DRAPES) ×6 IMPLANT
COVER SURGICAL LIGHT HANDLE (MISCELLANEOUS) ×3 IMPLANT
DRAPE HALF SHEET 40X57 (DRAPES) ×6 IMPLANT
DRAPE LAPAROSCOPIC ABDOMINAL (DRAPES) ×3 IMPLANT
DRAPE UTILITY XL STRL (DRAPES) ×3 IMPLANT
DRAPE WARM FLUID 44X44 (DRAPES) ×3 IMPLANT
DRSG OPSITE POSTOP 4X10 (GAUZE/BANDAGES/DRESSINGS) IMPLANT
DRSG OPSITE POSTOP 4X8 (GAUZE/BANDAGES/DRESSINGS) IMPLANT
DRSG VAC ATS LRG SENSATRAC (GAUZE/BANDAGES/DRESSINGS) ×1 IMPLANT
DRSG VAC ATS MED SENSATRAC (GAUZE/BANDAGES/DRESSINGS) ×1 IMPLANT
ELECT BLADE 6.5 EXT (BLADE) ×3 IMPLANT
ELECT CAUTERY BLADE 6.4 (BLADE) ×3 IMPLANT
ELECT REM PT RETURN 9FT ADLT (ELECTROSURGICAL) ×3
ELECTRODE REM PT RTRN 9FT ADLT (ELECTROSURGICAL) ×2 IMPLANT
GLOVE SRG 8 PF TXTR STRL LF DI (GLOVE) ×4 IMPLANT
GLOVE SURG ENC MOIS LTX SZ8 (GLOVE) ×6 IMPLANT
GLOVE SURG UNDER POLY LF SZ8 (GLOVE) ×6
GOWN STRL REUS W/ TWL LRG LVL3 (GOWN DISPOSABLE) ×8 IMPLANT
GOWN STRL REUS W/ TWL XL LVL3 (GOWN DISPOSABLE) ×4 IMPLANT
GOWN STRL REUS W/TWL LRG LVL3 (GOWN DISPOSABLE) ×12
GOWN STRL REUS W/TWL XL LVL3 (GOWN DISPOSABLE) ×6
HANDLE SUCTION POOLE (INSTRUMENTS) ×2 IMPLANT
KIT BASIN OR (CUSTOM PROCEDURE TRAY) ×3 IMPLANT
KIT OSTOMY DRAINABLE 2.75 STR (WOUND CARE) ×1 IMPLANT
KIT SIGMOIDOSCOPE (SET/KITS/TRAYS/PACK) IMPLANT
KIT TURNOVER KIT B (KITS) ×3 IMPLANT
LEGGING LITHOTOMY PAIR STRL (DRAPES) ×3 IMPLANT
LIGASURE IMPACT 36 18CM CVD LR (INSTRUMENTS) ×3 IMPLANT
NS IRRIG 1000ML POUR BTL (IV SOLUTION) ×6 IMPLANT
PACK GENERAL/GYN (CUSTOM PROCEDURE TRAY) ×3 IMPLANT
PAD ARMBOARD 7.5X6 YLW CONV (MISCELLANEOUS) ×6 IMPLANT
PENCIL SMOKE EVACUATOR (MISCELLANEOUS) ×3 IMPLANT
SEALER TISSUE X1 CVD JAW (INSTRUMENTS) ×1 IMPLANT
SPECIMEN JAR X LARGE (MISCELLANEOUS) ×3 IMPLANT
SPONGE T-LAP 18X18 ~~LOC~~+RFID (SPONGE) IMPLANT
STAPLER CVD CUT BL 40 RELOAD (ENDOMECHANICALS) ×3 IMPLANT
STAPLER CVD CUT BLU 40 RELOAD (ENDOMECHANICALS) IMPLANT
STAPLER VISISTAT 35W (STAPLE) ×3 IMPLANT
SUCTION POOLE HANDLE (INSTRUMENTS) ×3
SURGILUBE 2OZ TUBE FLIPTOP (MISCELLANEOUS) IMPLANT
SUT PDS AB 1 TP1 96 (SUTURE) ×6 IMPLANT
SUT PROLENE 2 0 CT2 30 (SUTURE) IMPLANT
SUT PROLENE 2 0 KS (SUTURE) IMPLANT
SUT SILK 2 0 SH CR/8 (SUTURE) ×3 IMPLANT
SUT SILK 2 0 TIES 10X30 (SUTURE) ×3 IMPLANT
SUT SILK 3 0 SH CR/8 (SUTURE) ×3 IMPLANT
SUT SILK 3 0 TIES 10X30 (SUTURE) ×3 IMPLANT
SUT VIC AB 3-0 SH 18 (SUTURE) ×1 IMPLANT
SYR BULB IRRIG 60ML STRL (SYRINGE) ×3 IMPLANT
TOWEL GREEN STERILE (TOWEL DISPOSABLE) ×6 IMPLANT
TRAY FOLEY MTR SLVR 14FR STAT (SET/KITS/TRAYS/PACK) ×3 IMPLANT
TUBE CONNECTING 12X1/4 (SUCTIONS) ×3 IMPLANT
UNDERPAD 30X36 HEAVY ABSORB (UNDERPADS AND DIAPERS) IMPLANT
YANKAUER SUCT BULB TIP NO VENT (SUCTIONS) ×3 IMPLANT

## 2021-02-26 NOTE — Progress Notes (Signed)
PT Cancellation Note  Patient Details Name: Travis Conrad MRN: [id number] DOB: 05-12-1951   Cancelled Treatment:    Reason Eval/Treat Not Completed: Patient at procedure or test/unavailable;Patient not medically ready.   Will see pt 9/11 as able. 02/26/2021  Ginger Carne., PT Acute Rehabilitation Services [phone]  (pager) [phone]  (office)    Tessie Fass Takoda Janowiak 02/26/2021, 2:22 PM

## 2021-02-26 NOTE — Plan of Care (Signed)
  Problem: Safety: Goal: Non-violent Restraint(s) Outcome: Progressing   Problem: Health Behavior/Discharge Planning: Goal: Ability to manage health-related needs will improve Outcome: Progressing   Problem: Clinical Measurements: Goal: Ability to maintain clinical measurements within normal limits will improve Outcome: Progressing Goal: Will remain free from infection Outcome: Progressing Goal: Diagnostic test results will improve Outcome: Progressing Goal: Respiratory complications will improve Outcome: Progressing Goal: Cardiovascular complication will be avoided Outcome: Progressing   Problem: Activity: Goal: Risk for activity intolerance will decrease Outcome: Progressing   Problem: Nutrition: Goal: Adequate nutrition will be maintained Outcome: Progressing   Problem: Coping: Goal: Level of anxiety will decrease Outcome: Progressing   Problem: Elimination: Goal: Will not experience complications related to bowel motility Outcome: Progressing Goal: Will not experience complications related to urinary retention Outcome: Progressing   Problem: Pain Managment: Goal: General experience of comfort will improve Outcome: Progressing   Problem: Safety: Goal: Ability to remain free from injury will improve Outcome: Progressing   Problem: Skin Integrity: Goal: Risk for impaired skin integrity will decrease Outcome: Progressing   Problem: Education: Goal: Knowledge of disease or condition will improve Outcome: Progressing Goal: Understanding of medication regimen will improve Outcome: Progressing Goal: Individualized Educational Video(s) Outcome: Progressing   Problem: Activity: Goal: Ability to tolerate increased activity will improve Outcome: Progressing   Problem: Cardiac: Goal: Ability to achieve and maintain adequate cardiopulmonary perfusion will improve Outcome: Progressing   Problem: Health Behavior/Discharge Planning: Goal: Ability to safely manage  health-related needs after discharge will improve Outcome: Progressing

## 2021-02-26 NOTE — Anesthesia Postprocedure Evaluation (Signed)
Anesthesia Post Note  Patient: Travis Conrad  Procedure(s) Performed: PARTIAL COLECTOMY AND COLOSTOMY EXPLORATORY LAPAROTOMY (Abdomen)     Patient location during evaluation: PACU Anesthesia Type: General Level of consciousness: awake and alert Pain management: pain level controlled Vital Signs Assessment: post-procedure vital signs reviewed and stable Respiratory status: spontaneous breathing, nonlabored ventilation, respiratory function stable and patient connected to nasal cannula oxygen Cardiovascular status: blood pressure returned to baseline and stable Postop Assessment: no apparent nausea or vomiting Anesthetic complications: no   No notable events documented.  Last Vitals:  Vitals:   02/26/21 1700 02/26/21 1800  BP: (!) 149/73 (!) 152/64  Pulse: 87 81  Resp:    Temp:    SpO2: 93% 96%    Last Pain:  Vitals:   02/26/21 1820  TempSrc:   PainSc: 10-Worst pain ever                 Effie Berkshire

## 2021-02-26 NOTE — Progress Notes (Signed)
Pre Procedure note for inpatients:   Travis Conrad has been scheduled for Procedure(s): PARTIAL COLECTOMY AND COLOSTOMY (N/A) today. The various methods of treatment have been discussed with the patient. After consideration of the risks, benefits and treatment options the patient has consented to the planned procedure.   The patient has been seen and labs reviewed. There are no changes in the patient's condition to prevent proceeding with the planned procedure today.  Recent labs:  Lab Results  Component Value Date   WBC 16.6 (H) 02/26/2021   HGB 12.9 (L) 02/26/2021   HCT 37.3 (L) 02/26/2021   PLT 334 02/26/2021   GLUCOSE 179 (H) 02/26/2021   CHOL 102 09/30/2019   TRIG 82 02/25/2021   HDL 35 (L) 09/30/2019   LDLCALC 32 09/30/2019   ALT 12 02/25/2021   AST 12 (L) 02/25/2021   NA 134 (L) 02/26/2021   K 3.9 02/26/2021   CL 99 02/26/2021   CREATININE 1.07 02/26/2021   BUN 14 02/26/2021   CO2 28 02/26/2021   TSH 2.370 05/02/2019   INR 1.1 02/19/2021   HGBA1C 5.9 (H) 02/25/2021    Mickeal Skinner, MD 02/26/2021 9:44 AM

## 2021-02-26 NOTE — Progress Notes (Signed)
OT Cancellation Note  Patient Details Name: Travis Conrad MRN: [id number] DOB: 1950/10/20   Cancelled Treatment:    Reason Eval/Treat Not Completed: Patient at procedure or test/ unavailable.  OT to continue efforts as appropriate.    Charles Andringa D Saliyah Gillin 02/26/2021, 1:06 PM

## 2021-02-26 NOTE — Op Note (Addendum)
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   Surgeon: Gurney Maxin, M.D.  Asst: Drucie Ip, MD  Anesthesia: General  Indications for procedure: Travis Conrad is a 70 y.o. year old male with symptoms of abdominal pain, ileus, and persistent leukocytosis s/p intra-abdominal drain placement by IR .  Description of procedure: The patient was brought into the operative suite. Anesthesia was administered with General endotracheal anesthesia. WHO checklist was applied. The patient was then placed in supine position. The area was prepped and draped in the usual sterile fashion.  Next, a midline incision was made. Cautery was used to dissect through the subcutaneous tissues and fascia. The fascia was safely entered. Upon initial inspection, there did not appear to be frank succus or purulent fluid in the abdomen. The sigmoid colon and portions of the small bowel were adhered together due to abscess formation but could be bluntly dissected apart. The radiologically placed drain in the left lower quadrant was removed from the patient. Next, the left White line of [place] was incised moving proximal. A location of healthy colon was identified for proximal margin. Further dissection distally freed the sigmoid and proximal rectum from the surrounding contents.   A location on the left abdominal wall was chosen for colostomy location. A circular incision was made in the skin and blunt dissection was used to identify the anterior rectus sheath. A cruciate incision was made through the anterior rectus sheath, the muscle fibers were bluntly dissected to identify the posterior rectus sheath and a cruciate incision was made in the posterior rectus sheath and dilated to fit the  colon. The colon was passed through the site and clamped extracorporeally.  Next, the abdomen was irrigated with warm saline. The rectal stump was sutured with a 2-0 prolene at each end for future identification., A 19 Fr blake drain was placed into the pelvis and brought out the left abdomen at the previous drain site, and the fascia was closed using two 1-0 PDS in running fashion. The ostomy was matured with 3-0 vicryl with a small amount of Brooking in the cardinal direction. A wound vac black sponge was placed into the wound and connected to vac device. All leakage was treated with adhesive..  Findings: Diverticulitis, intra-abdominal abscess  Specimen: sigmoid colon  Implant: none  Blood loss: 83m  Local anesthesia: none  Complications: none  LGurney Maxin M.D. General, Bariatric, & Minimally Invasive Surgery [facility]

## 2021-02-26 NOTE — Progress Notes (Signed)
[place] for heparin Indication: atrial fibrillation  Allergies  Allergen Reactions   Atorvastatin Other (See Comments)    myalgias    Patient Measurements: Height: 6' (182.9 cm) Weight: 79.4 kg (175 lb 0.7 oz) IBW/kg (Calculated) : 77.6 Heparin Dosing Weight: 79.4kg  Vital Signs: Temp: 97.9 F (36.6 C) (09/10 0458) Temp Source: Oral (09/10 0458) BP: 148/91 (09/10 0754) Pulse Rate: 78 (09/10 0754)  Labs: Recent Labs    02/24/21 0356 02/25/21 0144 02/25/21 0921 02/25/21 1804 02/26/21 0135  HGB 12.8* 13.8  --   --  12.9*  HCT 37.7* 39.5  --   --  37.3*  PLT 286 337  --   --  334  HEPARINUNFRC 0.53 0.23* 0.22* 0.22*  --   CREATININE 1.01 1.22  --   --  1.07     Estimated Creatinine Clearance: 70.5 mL/min (by C-G formula based on SCr of 1.07 mg/dL).  Assessment: 70 y/o male admitted for sepsis from contained perforated diverticulitis with active ileus. On PTA Xarelto for Afib with last dose on 9/2 at 2000.  On IV heparin-Xarelto on hold due to possibility that patient may require a laparotomy.        Repeat CT 9/7 with increased size of abscess, s/p IR drain 9/8.  Surgery ordered to stop heparin drip MN 9/9 in prep for surgery on 9/10. Heparin was stopped at midnight on 9/9. Heparin levels have been subtherapeutic around 0.2s and the rate was increased before holding for surgery.  Goal of Therapy:  Heparin level 0.3-0.7 units/ml Monitor platelets by anticoagulation protocol: Yes   Plan:  Restart heparin 1850 units/hr after surgery. Daily HL and CBC Monitor for signs and symptoms of bleeding.    Varney Daily, PharmD PGY1 Pharmacy Resident  Please check AMION for all [facility] pharmacy phone numbers After 10:00 PM call main pharmacy [phone]

## 2021-02-26 NOTE — Anesthesia Procedure Notes (Signed)
Procedure Name: Intubation Date/Time: 02/26/2021 10:48 AM Performed by: Thelma Comp, CRNA Pre-anesthesia Checklist: Patient identified, Emergency Drugs available, Suction available and Patient being monitored Patient Re-evaluated:Patient Re-evaluated prior to induction Oxygen Delivery Method: Circle System Utilized Preoxygenation: Pre-oxygenation with 100% oxygen Induction Type: IV induction Ventilation: Mask ventilation without difficulty Laryngoscope Size: Mac and 4 Grade View: Grade II Tube type: Oral Tube size: 7.5 mm Number of attempts: 1 Airway Equipment and Method: Stylet Placement Confirmation: ETT inserted through vocal cords under direct vision, positive ETCO2 and breath sounds checked- equal and bilateral Secured at: 23 cm Tube secured with: Tape Dental Injury: Teeth and Oropharynx as per pre-operative assessment

## 2021-02-26 NOTE — Transfer of Care (Signed)
Immediate Anesthesia Transfer of Care Note  Patient: Travis Conrad  Procedure(s) Performed: PARTIAL COLECTOMY AND COLOSTOMY EXPLORATORY LAPAROTOMY (Abdomen)  Patient Location: PACU  Anesthesia Type:General  Level of Consciousness: awake, alert  and oriented  Airway & Oxygen Therapy: Patient Spontanous Breathing and Patient connected to face mask oxygen  Post-op Assessment: Report given to RN, Post -op Vital signs reviewed and stable and Patient moving all extremities  Post vital signs: Reviewed and stable  Last Vitals:  Vitals Value Taken Time  BP 105/72 02/26/21 1317  Temp    Pulse 84 02/26/21 1320  Resp 20 02/26/21 1320  SpO2 99 % 02/26/21 1320  Vitals shown include unvalidated device data.  Last Pain:  Vitals:   02/26/21 0913  TempSrc: Oral  PainSc: 0-No pain      Patients Stated Pain Goal: 3 ([id number])  Complications: No notable events documented.

## 2021-02-26 NOTE — Progress Notes (Addendum)
PROGRESS NOTE        PATIENT DETAILS Name: Travis Conrad Age: 70 y.o. Sex: male Date of Birth: [birth year] Admit Date: 02/19/2021 Admitting Physician Etta Quill, DO PCP:Sun, [name], MD  Brief Narrative: Patient is a 70 y.o. male A. fib, HTN, HLD, prior diverticulitis, heavy EtOH use-who presented with lower abdominal pain/nausea/vomiting-on initial presentation to the ED found to have severe sepsis physiology with hypotension/AKI-felt to be due to perforated diverticulitis.  See below for further details.  Significant events: 9/3>> presented with lower abdominal pain/vomiting-sepsis from contained perforated diverticulitis.  Found to have very active ileus/PSBO from diverticulitis/bowel inflammation 9/4>> A. fib with RVR-not responding to Lopressor/Cardizem-BP soft-started amiodarone infusion-cardiology consulted. 9/7>> increase in size of pelvic abscess on repeat CT. 9/8>> CT-guided drainage of pelvic abscess 9/9>> right testicular pain/swelling-scrotal ultrasound with Doppler pending.  Significant studies: 9/3>> CT abdomen/pelvis: Acute sigmoid diverticulitis-small extraluminal gas-fluid collection in the sigmoid mesocolon consistent with abscess.  Dilated proximal small bowel-likely ileus. 9/7>> CT abdomen/pelvis: Abscess has increased in size 9/7>> Echo: EF 60-65%.  Antimicrobial therapy: Zosyn: 9/3>>  Microbiology data: 9/3>> COVID/influenza PCR: Negative 9/3>> blood culture: Negative 9/8>> culture pelvic abscess: Pending.  Procedures : 9/8>> CT-guided placement of 12 French drainage catheter into pelvic collection.  Consults: General surgery Cardiology Interventional radiology  DVT Prophylaxis : SCDs Start: 02/20/21 0002   Subjective:  Patient in bed, appears comfortable, denies any headache, no fever, no chest pain or pressure, no shortness of breath , no abdominal pain. No new focal weakness, improved testicular  pain.   Assessment/Plan:  Severe sepsis due to diverticulitis with contained perforation and small abscess: Sepsis physiology has resolved-although continues to have leukocytosis-remains on Zosyn.  Underwent CT-guided drainage of pelvic abscess on 9/8.  General surgery following-with possible laparotomy/colectomy/colostomy today on 02/26/21.  Since remains n.p.o. for the past several days-started on TNA today.  Ileus: Likely sequelae of diverticulitis-NG tube in place-no return of bowel function as of yet.  General surgery following.    AKI: Hemodynamically mediated-improved-creatinine has now normalized.  Hypokalemia: Repleted and has normalized.  Continue to follow periodically.  PAF with RVR: RVR provoked by sepsis/alcohol withdrawal-currently in sinus rhythm-remains on amiodarone infusion until oral intake can be established.  On IV heparin-Xarelto on hold due to the small possibility that patient may require a laparotomy.  Cardiology following.     Right scrotal pain and swelling due to epididymitis : Has been placed on doxycycline in addition to Zosyn, symptoms improving continue to monitor, denies any exposure or unprotected sex in the last few years, no unusual discharge.  ?Alcohol withdrawal: Awake and alert this morning-last drink was on 9/2, counseled to quit alcohol, no DTs.  HTN: All antihypertensives on hold-as BP soft.    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  Hypophosphatemia.  Replaced    Diet: Diet Order             Diet NPO time specified Except for: Sips with Meds  Diet effective now                    Code Status: Full code  Family Communication: Spouse-Jimmi Fitzwater-[phone] updated at bedside on 9/9  Disposition Plan: Status is: Inpatient  Remains inpatient appropriate because:Inpatient level of care appropriate due to severity of illness  Dispo:  The  patient is from: Home              Anticipated d/c is to: Home              Patient currently is not medically stable to d/c.   Difficult to place patient No   Barriers to Discharge: Perforated diverticulitis with abscess-on IV antibiotics-developing alcohol withdrawal symptoms/PAF with RVR.  Antimicrobial agents: Anti-infectives (From admission, onward)    Start     Dose/Rate Route Frequency Ordered Stop   02/26/21 0100  [MAR Hold]  doxycycline (VIBRAMYCIN) 100 mg in sodium chloride 0.9 % 250 mL IVPB        (MAR Hold since Sat 02/26/2021 at 0846.Hold Reason: Transfer to a Procedural area)   100 mg 125 mL/hr over 120 Minutes Intravenous 2 times daily 02/26/21 0005     02/20/21 0300  [MAR Hold]  piperacillin-tazobactam (ZOSYN) IVPB 3.375 g        (MAR Hold since Sat 02/26/2021 at 0846.Hold Reason: Transfer to a Procedural area)   3.375 g 12.5 mL/hr over 240 Minutes Intravenous Every 8 hours 02/19/21 1907     02/19/21 1900  ceFEPIme (MAXIPIME) 2 g in sodium chloride 0.9 % 100 mL IVPB  Status:  Discontinued        2 g 200 mL/hr over 30 Minutes Intravenous  Once 02/19/21 1857 02/19/21 1906   02/19/21 1900  metroNIDAZOLE (FLAGYL) IVPB 500 mg  Status:  Discontinued        500 mg 100 mL/hr over 60 Minutes Intravenous  Once 02/19/21 1857 02/19/21 1906   02/19/21 1815  piperacillin-tazobactam (ZOSYN) IVPB 3.375 g        3.375 g 100 mL/hr over 30 Minutes Intravenous  Once 02/19/21 1809 02/19/21 1944        Time spent: 35 minutes-Greater than 50% of this time was spent in counseling, explanation of diagnosis, planning of further management, and coordination of care.  MEDICATIONS: Scheduled Meds:  [MAR Hold] Chlorhexidine Gluconate Cloth  6 each Topical Daily   [MAR Hold] folic acid  1 mg Per Tube Daily   insulin aspart  0-15 Units Subcutaneous Q4H   [MAR Hold] nicotine  21 mg Transdermal Daily   [MAR Hold] pantoprazole (PROTONIX) IV  40 mg Intravenous Q24H   [MAR Hold] thiamine  100 mg  Per Tube Daily   Or   [MAR Hold] thiamine  100 mg Intravenous Daily   Continuous Infusions:  [MAR Hold] sodium chloride Stopped (02/23/21 0259)   amiodarone 30 mg/hr (02/26/21 1033)   [MAR Hold] doxycycline (VIBRAMYCIN) IV 100 mg (02/26/21 0039)   lactated ringers with kcl 10 mL/hr at 02/25/21 1729   lactated ringers     [MAR Hold] piperacillin-tazobactam (ZOSYN)  IV 3.375 g (02/26/21 0346)   [MAR Hold] potassium PHOSPHATE IVPB (in mmol)     TPN ADULT (ION) 40 mL/hr at 02/25/21 1725   TPN ADULT (ION)     PRN Meds:.[MAR Hold] sodium chloride, 0.9 % irrigation (POUR BTL), [MAR Hold] acetaminophen **OR** [DISCONTINUED] acetaminophen, [MAR Hold] hydrALAZINE, [MAR Hold]  HYDROmorphone (DILAUDID) injection, [MAR Hold] menthol-cetylpyridinium, [MAR Hold] metoprolol tartrate, [DISCONTINUED] ondansetron **OR** [MAR Hold] ondansetron (ZOFRAN) IV, [MAR Hold] sodium chloride flush   PHYSICAL EXAM: Vital signs: Vitals:   02/26/21 0400 02/26/21 0458 02/26/21 0754 02/26/21 0913  BP: 137/78 (!) 155/87 (!) 148/91 (!) 149/74  Pulse: 87 91 78 79  Resp: (!) '21 20 20 16  '$ Temp:  97.9 F (36.6 C)  98.4 F (  36.9 C)  TempSrc:  Oral  Oral  SpO2: 92% 97% 99% 97%  Weight:    82.6 kg  Height:    6' (1.829 m)   Filed Weights   02/19/21 1800 02/20/21 2000 02/26/21 0913  Weight: 79.4 kg 79.4 kg 82.6 kg   Body mass index is 24.68 kg/m.   Awake Alert, No new F.N deficits, NG in place [place].AT,PERRAL Supple Neck,No JVD, No cervical lymphadenopathy appriciated.  Symmetrical Chest wall movement, Good air movement bilaterally, CTAB RRR,No Gallops, Rubs or new Murmurs, No Parasternal Heave +ve B.Sounds, Abd Soft, No tenderness, No organomegaly appriciated, No rebound - guarding or rigidity. No Cyanosis, Clubbing or edema, No new Rash or bruise   I have personally reviewed following labs and imaging studies  LABORATORY DATA:  Recent Labs  Lab 02/19/21 1838 02/20/21 0549 02/22/21 0022 02/23/21 0120  02/24/21 0356 02/25/21 0144 02/26/21 0135  WBC  --    < > 12.8* 16.1* 14.6* 15.6* 16.6*  HGB  --    < > 13.5 13.3 12.8* 13.8 12.9*  HCT  --    < > 38.8* 39.5 37.7* 39.5 37.3*  PLT  --    < > 278 254 286 337 334  MCV  --    < > 100.0 101.8* 102.7* 101.0* 100.8*  MCH  --    < > 34.8* 34.3* 34.9* 35.3* 34.9*  MCHC  --    < > 34.8 33.7 34.0 34.9 34.6  RDW  --    < > 13.0 13.2 13.2 13.2 13.2  LYMPHSABS 1.7  --   --   --   --   --   --   MONOABS 1.6*  --   --   --   --   --   --   EOSABS 0.0  --   --   --   --   --   --   BASOSABS 0.1  --   --   --   --   --   --    < > = values in this interval not displayed.    Recent Labs  Lab 02/19/21 1653 02/19/21 1838 02/20/21 0549 02/20/21 0920 02/21/21 0119 02/22/21 0022 02/22/21 1619 02/23/21 0120 02/24/21 0356 02/25/21 0144 02/25/21 0957 02/26/21 0135  NA 131*  --  132*  --    < > 133* 137 136 134* 135  --  134*  K 3.2*  --  3.4*  --    < > 2.6* 3.4* 3.7 4.6 4.0  --  3.9  CL 88*  --  92*  --    < > 92* 94* 98 98 98  --  99  CO2 25  --  25  --    < > 31 34* '30 24 26  '$ --  28  GLUCOSE 137*  --  121*  --    < > 238* 113* 96 80 91  --  179*  BUN 38*  --  38*  --    < > 28* '19 19 15 11  '$ --  14  CREATININE 3.23*  --  2.93*  --    < > 1.43* 1.36* 1.32* 1.01 1.22  --  1.07  CALCIUM 11.0*  --  9.6  --    < > 8.3* 8.5* 8.5* 8.7* 8.3*  --  8.2*  AST 18  --  36  --   --   --   --  15  --  12*  --   --  ALT 11  --  23  --   --   --   --  13  --  12  --   --   ALKPHOS 85  --  94  --   --   --   --  64  --  50  --   --   BILITOT 1.1  --  1.4*  --   --   --   --  0.4  --  1.1  --   --   ALBUMIN 3.9  --  2.7*  --   --   --   --  2.2*  --  2.0*  --   --   MG  --  2.0  --   --    < > 2.1  --  2.0 2.0 1.8  --  2.0  LATICACIDVEN  --  1.3  --  1.5  --   --   --   --   --   --   --   --   INR  --  1.1  --   --   --   --   --   --   --   --   --   --   HGBA1C  --   --   --   --   --   --   --   --   --   --  5.9*  --    < > = values in this interval not  displayed.    Recent Labs  Lab 02/19/21 1653 02/19/21 1838 02/20/21 0549 02/20/21 0920 02/21/21 0119 02/22/21 0022 02/23/21 0120 02/24/21 0356 02/25/21 0144 02/26/21 0135  WBC 21.8*  --  14.3*  --    < > 12.8* 16.1* 14.6* 15.6* 16.6*  HGB 16.0  --  15.3  --    < > 13.5 13.3 12.8* 13.8 12.9*  HCT 44.8  --  44.1  --    < > 38.8* 39.5 37.7* 39.5 37.3*  PLT 254  --  261  --    < > 278 254 286 337 334  AST 18  --  36  --   --   --  15  --  12*  --   ALT 11  --  23  --   --   --  13  --  12  --   ALKPHOS 85  --  94  --   --   --  64  --  50  --   BILITOT 1.1  --  1.4*  --   --   --  0.4  --  1.1  --   ALBUMIN 3.9  --  2.7*  --   --   --  2.2*  --  2.0*  --   INR  --  1.1  --   --   --   --   --   --   --   --   LATICACIDVEN  --  1.3  --  1.5  --   --   --   --   --   --   SARSCOV2NAA  --  NEGATIVE  --   --   --   --   --   --   --   --    < > = values in this interval not displayed.       RADIOLOGY STUDIES/RESULTS: DG Abd Portable 1V  Result  Date: 02/26/2021 CLINICAL DATA:  Abdominal pain and constipation, still with nausea. EXAM: PORTABLE ABDOMEN - 1 VIEW COMPARISON:  February 25, 2021. FINDINGS: Bowel gas pattern with persistent small bowel distension up to 5.8 cm in the upper abdomen similar to the prior exam. Colon is collapsed distally. Nasogastric tube with side port at the level of the EG junction, perhaps slightly above EG junction. EKG leads projecting over the abdomen. Pigtail drainage catheter slightly to the LEFT of midline. Images slightly rotated to the LEFT however. On limited assessment there is no acute skeletal process. IMPRESSION: Persistent small bowel distension up to 5.8 cm in the upper abdomen, unchanged. Perhaps marked ileus due to inflammatory process in the pelvis though in the absence of significant colonic gas developing small-bowel obstruction is considered. Suggest attention on follow-up. Nasogastric tube with side port at the level of the EG junction,  perhaps slightly above the EG junction. 3-4 cm advancement could be considered for more optimal placement. Electronically Signed   By: Zetta Bills M.D.   On: 02/26/2021 10:28   DG Abd Portable 1V  Result Date: 02/25/2021 CLINICAL DATA:  Epigastric abdominal pain. EXAM: PORTABLE ABDOMEN - 1 VIEW COMPARISON:  February 21, 2021. FINDINGS: Distal tip of nasogastric tube is seen in expected position of proximal stomach. Small bowel dilatation is noted concerning for distal small bowel obstruction or possibly ileus. Percutaneous drainage catheter is noted in the pelvis. IMPRESSION: Distal tip of nasogastric tube seen in expected position of proximal stomach. Small bowel dilatation is noted concerning for distal small bowel obstruction or possibly ileus. Electronically Signed   By: Marijo Conception M.D.   On: 02/25/2021 12:20   US SCROTUM W/DOPPLER  Result Date: 02/25/2021 CLINICAL DATA:  Right scrotal pain for 2 days EXAM: SCROTAL ULTRASOUND DOPPLER ULTRASOUND OF THE TESTICLES TECHNIQUE: Complete ultrasound examination of the testicles, epididymis, and other scrotal structures was performed. Color and spectral Doppler ultrasound were also utilized to evaluate blood flow to the testicles. COMPARISON:  CT 02/23/2021 FINDINGS: Right testicle Measurements: 3.9 x 2.4 x 2.2 cm. No mass or microlithiasis visualized. Left testicle Measurements: 3.5 x 2.2 x 2.4 cm. No mass or microlithiasis visualized. Right epididymis: Small epididymal cyst containing scattered echoes measuring 5 mm. Appears hyperemic. Left epididymis:  Normal in size.  Appears hyperemic at the tail. Hydrocele:  Large complex right hydrocele contains fluid level. Varicocele:  Small left varicocele Pulsed Doppler interrogation of both testes demonstrates normal low resistance arterial and venous waveforms bilaterally. IMPRESSION: 1. Negative for acute testicular torsion. 2. Heterogenous slightly hyperemic right greater than left epididymis suggesting  epididymitis 3. Large complex right hydrocele contains fluid fluid levels; echogenic dependent more complex fluid could be secondary to blood, inflammation, or infection. 4. Small left varicocele Electronically Signed   By: Donavan Foil M.D.   On: 02/25/2021 18:52   CT IMAGE GUIDED DRAINAGE BY PERCUTANEOUS CATHETER  Result Date: 02/24/2021 INDICATION: Perforated diverticulitis EXAM: CT IMAGE GUIDED DRAINAGE BY PERCUTANEOUS CATHETER COMPARISON:  CT Abdomen Pelvis, 02/23/2021. MEDICATIONS: The patient is currently admitted to the hospital and receiving intravenous antibiotics. The antibiotics were administered within an appropriate time frame prior to the initiation of the procedure. ANESTHESIA/SEDATION: Moderate (conscious) sedation was employed during this procedure. A total of Versed 2 mg and Fentanyl 100 mcg was administered intravenously. Moderate Sedation Time: 25 minutes. The patient's level of consciousness and vital signs were monitored continuously by radiology nursing throughout the procedure under my direct supervision. CONTRAST:  None COMPLICATIONS: None immediate.  PROCEDURE: Informed written consent was obtained from the patient after a discussion of the risks, benefits and alternatives to treatment. The patient was placed supine on the CT gantry and a pre procedural CT was performed re-demonstrating the known abscess/fluid collection within the anterior pelvis. The procedure was planned. A timeout was performed prior to the initiation of the procedure. The LEFT lower quadrant was prepped and draped in the usual sterile fashion. The overlying soft tissues were anesthetized with 1% lidocaine with epinephrine. Appropriate trajectory was planned with the use of a 22 gauge spinal needle. An 18 gauge trocar needle was advanced into the abscess/fluid collection and a short Amplatz super stiff wire was coiled within the collection. Appropriate positioning was confirmed with a limited CT scan. The tract was  serially dilated allowing placement of a 12 [place] all-purpose drainage catheter. Appropriate positioning was confirmed with a limited postprocedural CT scan. 15 mL of feculent fluid was aspirated. The tube was connected to a bulb suction and sutured in place. A dressing was placed. The patient tolerated the procedure well without immediate post procedural complication. IMPRESSION: Successful CT guided placement of a 12 French drainage catheter into the anterior pelvic collection for perforated diverticulitis, as above. Samples were sent to the laboratory as requested by the ordering clinical team. Michaelle Birks, MD [facility] Electronically Signed   By: Michaelle Birks M.D.   On: 02/24/2021 16:01   [place] EKG SITE RITE  Result Date: 02/24/2021 If Site Rite image not attached, placement could not be confirmed due to current cardiac rhythm.    LOS: 6 days   Signature  Lala Lund M.D on 02/26/2021 at 11:17 AM   -  To page go to [email or web address]    02/26/2021, 11:16 AM

## 2021-02-26 NOTE — Progress Notes (Signed)
PHARMACY - TOTAL PARENTERAL NUTRITION CONSULT NOTE   Indication: Prolonged ileus  Patient Measurements: Height: 6' (182.9 cm) Weight: 82.6 kg (182 lb) IBW/kg (Calculated) : 77.6 TPN AdjBW (KG): 82.6 Body mass index is 24.68 kg/m.  Assessment: 52 YOM with lower abdominal pain/N/V found to have perforate diverticulitis. CT abd on 9/3 found to have active ileus/PSBO from diverticulitis/bowel inflammation. Pharmacy consulted to start TPN for prolonged ileus. Of note patient has a h/o alcohol dependency and is at high risk for refeeding.   Glucose / Insulin: no hx DM, CBGs controlled on low end off TPN, now borderline 131-184 on TPN, 6 units sSSI utilized in last 24hrs Electrolytes: Na 135>134, K 4>3.9 (goal >/=4), Mg up to 2 (s/p Mag sulfate 1g IV x 1 yesterday; goal >/=2), Phos 3.1>2.3, others WNL Renal: SCr down to 1.07, BUN WNL Hepatic: LFTs / Tbili / TG wnl, Albumin 2  Intake / Output; MIVF: NG output 564m/24hrs, Drain output 3221m24hrs; UOP 0.21m59mg/hr; MIVF: LR+40K at 10 ml/hr. Net +10L this admit GI Imaging: 9/3 CT abd: Active ileus/PSBO GI Surgeries / Procedures: 9/10 partial colectomy and colostomy  Central access: PICC line 9/8 >>  TPN start date: 9/9>>   Nutritional Goals: Goal TPN rate is 85 mL/hr (provides 102 g of protein and 2087 kcals per day)  RD Assessment: Estimated Needs Total Energy Estimated Needs: 2000-2200 Total Protein Estimated Needs: 100-110 grams Total Fluid Estimated Needs: >2L  Current Nutrition:  TPN; NPO  Plan:  Increase TPN slightly to 62m92m at 1800 - conservative increase due to refeeding risk. Will replace electrolytes and advance to goal rate of 85 mL/hr as tolerated. Electrolytes in TPN: increase Na to 921mE41m K 62mEq13mCa 21mEq/L14mg 21mEq/L,61md increase Phos to 20mmol/L1021m:Ac 1:1 Kphos 30mmol IV40m already ordered per MD this AM Add standard MVI and trace elements to TPN Intensify SSI to Moderate q4h and adjust as needed  Continue  MIVF per MD (LR+40K) at 10 mL/hr at 1800 Monitor TPN labs, F/u Surgery plans post-op   Ajla Mcgeachy von Arturo MortonBCPS Please check AMION for all [facility] PharmacMaysvilleumbers Clinical Pharmacist 02/26/2021 9:54 AM

## 2021-02-26 NOTE — Consult Note (Addendum)
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 Kieth Brightly on 02/26/21. Consult received for new ostomy and NPWT dressings.   [place] Nurse to see on Monday, 02/28/21.  Medium VAC dressing kit requested for patient.  [place] nursing team will follow, and will remain available to this patient, the nursing and medical teams.   Thanks, Maudie Flakes, MSN, RN, [place], Arther Abbott  Pager# [phone]

## 2021-02-27 ENCOUNTER — Encounter (HOSPITAL_COMMUNITY): Payer: Self-pay | Admitting: General Surgery

## 2021-02-27 DIAGNOSIS — K5720 Diverticulitis of large intestine with perforation and abscess without bleeding: Secondary | ICD-10-CM | POA: Diagnosis not present

## 2021-02-27 LAB — BASIC METABOLIC PANEL WITH GFR
Anion gap: 7 (ref 5–15)
BUN: 13 mg/dL (ref 8–23)
CO2: 26 mmol/L (ref 22–32)
Calcium: 8.1 mg/dL — ABNORMAL LOW (ref 8.9–10.3)
Chloride: 101 mmol/L (ref 98–111)
Creatinine, Ser: 0.91 mg/dL (ref 0.61–1.24)
GFR, Estimated: >60 mL/min
Glucose, Bld: 191 mg/dL — ABNORMAL HIGH (ref 70–99)
Potassium: 4.1 mmol/L (ref 3.5–5.1)
Sodium: 134 mmol/L — ABNORMAL LOW (ref 135–145)

## 2021-02-27 LAB — GLUCOSE, CAPILLARY
Glucose-Capillary: 197 mg/dL — ABNORMAL HIGH (ref 70–99)
Glucose-Capillary: 165 mg/dL — ABNORMAL HIGH (ref 70–99)
Glucose-Capillary: 150 mg/dL — ABNORMAL HIGH (ref 70–99)
Glucose-Capillary: 141 mg/dL — ABNORMAL HIGH (ref 70–99)
Glucose-Capillary: 142 mg/dL — ABNORMAL HIGH (ref 70–99)

## 2021-02-27 LAB — PHOSPHORUS
Phosphorus: 1.8 mg/dL — ABNORMAL LOW (ref 2.5–4.6)
Phosphorus: 3.4 mg/dL (ref 2.5–4.6)

## 2021-02-27 LAB — CBC
HCT: 36.4 % — ABNORMAL LOW (ref 39.0–52.0)
Hemoglobin: 12.4 g/dL — ABNORMAL LOW (ref 13.0–17.0)
MCH: 34.3 pg — ABNORMAL HIGH (ref 26.0–34.0)
MCHC: 34.1 g/dL (ref 30.0–36.0)
MCV: 100.6 fL — ABNORMAL HIGH (ref 80.0–100.0)
Platelets: 355 K/uL (ref 150–400)
RBC: 3.62 MIL/uL — ABNORMAL LOW (ref 4.22–5.81)
RDW: 13.2 % (ref 11.5–15.5)
WBC: 19.6 K/uL — ABNORMAL HIGH (ref 4.0–10.5)
nRBC: 0 % (ref 0.0–0.2)

## 2021-02-27 LAB — HEPARIN LEVEL (UNFRACTIONATED)
Heparin Unfractionated: <0.1 IU/mL — ABNORMAL LOW (ref 0.30–0.70)
Heparin Unfractionated: <0.1 IU/mL — ABNORMAL LOW (ref 0.30–0.70)
Heparin Unfractionated: 0.22 IU/mL — ABNORMAL LOW (ref 0.30–0.70)

## 2021-02-27 LAB — MAGNESIUM: Magnesium: 1.9 mg/dL (ref 1.7–2.4)

## 2021-02-27 MED ORDER — METHOCARBAMOL 1000 MG/10ML IJ SOLN
500.0000 mg | Freq: Four times a day (QID) | INTRAVENOUS | Status: DC
  Administered 2021-02-27 – 2021-03-01 (×9): 500 mg via INTRAVENOUS
  Filled 2021-02-27: qty 5
  Filled 2021-02-27: qty 500
  Filled 2021-02-27: qty 5
  Filled 2021-02-27 (×2): qty 500
  Filled 2021-02-27: qty 5
  Filled 2021-02-27: qty 500
  Filled 2021-02-27: qty 5
  Filled 2021-02-27: qty 500
  Filled 2021-02-27 (×2): qty 5

## 2021-02-27 MED ORDER — CLONIDINE HCL 0.2 MG/24HR TD PTWK: 0.2000 mg | MEDICATED_PATCH | TRANSDERMAL | Status: DC

## 2021-02-27 MED ORDER — POTASSIUM PHOSPHATES 15 MMOLE/5ML IV SOLN
30.0000 mmol | Freq: Once | INTRAVENOUS | Status: AC
  Administered 2021-02-27: 30 mmol via INTRAVENOUS
  Filled 2021-02-27: qty 10

## 2021-02-27 MED ORDER — TRAVASOL 10 % IV SOLN
INTRAVENOUS | Status: AC
  Filled 2021-02-27: qty 720

## 2021-02-27 MED ORDER — DIPHENHYDRAMINE HCL 50 MG/ML IJ SOLN
50.0000 mg | Freq: Once | INTRAMUSCULAR | Status: AC
  Administered 2021-02-28: 50 mg via INTRAVENOUS
  Filled 2021-02-27: qty 1

## 2021-02-27 MED ORDER — HEPARIN (PORCINE) 25000 UT/250ML-% IV SOLN
1750.0000 [IU]/h | INTRAVENOUS | Status: DC
  Administered 2021-02-27: 1850 [IU]/h via INTRAVENOUS
  Administered 2021-02-28 (×2): 1900 [IU]/h via INTRAVENOUS
  Administered 2021-03-01: 1750 [IU]/h via INTRAVENOUS
  Administered 2021-03-01: 1900 [IU]/h via INTRAVENOUS
  Filled 2021-02-27 (×5): qty 250

## 2021-02-27 MED ORDER — LIDOCAINE 5 % EX PTCH
1.0000 | MEDICATED_PATCH | CUTANEOUS | Status: DC
  Administered 2021-02-27 – 2021-03-27 (×28): 1 via TRANSDERMAL
  Filled 2021-02-27 (×30): qty 1

## 2021-02-27 MED ORDER — METOPROLOL TARTRATE 100 MG PO TABS
100.0000 mg | ORAL_TABLET | Freq: Two times a day (BID) | ORAL | Status: DC
  Administered 2021-02-27 – 2021-02-28 (×3): 100 mg via ORAL
  Filled 2021-02-27 (×2): qty 1

## 2021-02-27 MED ORDER — MAGNESIUM SULFATE 2 GM/50ML IV SOLN
2.0000 g | Freq: Once | INTRAVENOUS | Status: AC
  Administered 2021-02-27: 2 g via INTRAVENOUS
  Filled 2021-02-27: qty 50

## 2021-02-27 MED ORDER — ACETAMINOPHEN 10 MG/ML IV SOLN
1000.0000 mg | Freq: Four times a day (QID) | INTRAVENOUS | Status: AC
  Administered 2021-02-27 – 2021-02-28 (×4): 1000 mg via INTRAVENOUS
  Filled 2021-02-27 (×4): qty 100

## 2021-02-27 NOTE — Progress Notes (Signed)
PHARMACY - TOTAL PARENTERAL NUTRITION CONSULT NOTE   Indication: Prolonged ileus  Patient Measurements: Height: 6' (182.9 cm) Weight: 82.6 kg (182 lb) IBW/kg (Calculated) : 77.6 TPN AdjBW (KG): 82.6 Body mass index is 24.68 kg/m.  Assessment: 81 YOM with lower abdominal pain/N/V found to have perforate diverticulitis. CT abd on 9/3 found to have active ileus/PSBO from diverticulitis/bowel inflammation. Pharmacy consulted to start TPN for prolonged ileus. Of note patient has a h/o alcohol dependency and is at high risk for refeeding.   Glucose / Insulin: no hx DM, CBGs controlled on low end off TPN, now running higher 191-206 on TPN (noted, received Decadron '5mg'$  IV x 1 yesterday) 16 units mSSI utilized in last 24hrs Electrolytes: Na stable 134, K up to 4.1 (goal >/=4 with ileus), Mg 1.9 (goal >/=2 with ileus), Phos down to 1.8 (increased in TPN; KPhos 37mol IV x 1 ordered yesterday not given), others WNL Renal: SCr down to 0.91, BUN WNL Hepatic: LFTs / Tbili / TG wnl, Albumin 2  Intake / Output; MIVF: NG output 10547m Drain output 21477mstool output 5ml55m00ml57mod out during procedure; UOP 0.7ml/k41mr; MIVF: LR+40K at 10 ml/hr. Net +10.3L this admit GI Imaging: 9/3 CT abd: Active ileus/PSBO GI Surgeries / Procedures: 9/10 ex-lap, sigmoid colectomy w/ end colostomy creation, enterorrhaphy, drainage of intra-abd abscess, wound vac placement  Central access: PICC line 9/8 >>  TPN start date: 9/9>>   Nutritional Goals: Goal TPN rate is 85 mL/hr (provides 102 g of protein and 2087 kcals per day)  RD Assessment: Estimated Needs Total Energy Estimated Needs: 2000-2200 Total Protein Estimated Needs: 100-110 grams Total Fluid Estimated Needs: >2L  Current Nutrition:  TPN; NPO  Plan:  Increase TPN slightly to 60mL/h55m 1800 - conservative increase due to refeeding risk. Will replace electrolytes and advance to goal rate of 85 mL/hr as tolerated. Electrolytes in TPN: increase Na to  100mEq/L33m50mEq/L,17m5mEq/L, i88mease Mg to 7mEq/L, an68mncrease Phos to 25mmol/L. C26m 1:1 Give Mag sulfate 2g IV x 1 Kphos 30mmol IV x 23mready ordered per MD this AM (ordered yesterday but not given due to pt in procedural area per documentation) Add standard MVI and trace elements to TPN Continue Moderate q4h SSI - will hold off placing insulin bag today with patient receiving dose of IV steroids yesterday. Will monitor CBGs and adjust as needed Continue MIVF per MD (LR+40K) at 10 mL/hr at 1800 Monitor TPN labs, F/u Surgery plans post-op  Recheck phos at 2000 tonight [place] Please check AMION for all [facility] cSunny Slopesers Clinical Pharmacist 02/27/2021 8:16 AM

## 2021-02-27 NOTE — Progress Notes (Signed)
PROGRESS NOTE        PATIENT DETAILS Name: Travis Conrad Age: 70 y.o. Sex: male Date of Birth: 1950-07-30 Admit Date: 02/19/2021 Admitting Physician Etta Quill, DO PCP:Sun, [name], MD  Brief Narrative: Patient is a 70 y.o. male A. fib, HTN, HLD, prior diverticulitis, heavy EtOH use-who presented with lower abdominal pain/nausea/vomiting-on initial presentation to the ED found to have severe sepsis physiology with hypotension/AKI-felt to be due to perforated diverticulitis.  See below for further details.  Significant events: 9/3>> presented with lower abdominal pain/vomiting-sepsis from contained perforated diverticulitis.  Found to have very active ileus/PSBO from diverticulitis/bowel inflammation 9/4>> A. fib with RVR-not responding to Lopressor/Cardizem-BP soft-started amiodarone infusion-cardiology consulted. 9/7>> increase in size of pelvic abscess on repeat CT. 9/8>> CT-guided drainage of pelvic abscess 9/9>> right testicular pain/swelling-scrotal ultrasound with Doppler pending.  Significant studies: 9/3>> CT abdomen/pelvis: Acute sigmoid diverticulitis-small extraluminal gas-fluid collection in the sigmoid mesocolon consistent with abscess.  Dilated proximal small bowel-likely ileus. 9/7>> CT abdomen/pelvis: Abscess has increased in size 9/7>> Echo: EF 60-65%.  Antimicrobial therapy: Zosyn: 9/3>>  Microbiology data: 9/3>> COVID/influenza PCR: Negative 9/3>> blood culture: Negative 9/8>> culture pelvic abscess: Pending.  Procedures : 9/8>> CT-guided placement of 12 French drainage catheter into pelvic collection. 9/10>> s/p ex lap, sigmoid colectomy, end colostomy with enterorrhaphy and drainage of intraabdominal abscess - Dr. Kieth Brightly and Dr. Radene Knee 9/10  Consults: General surgery Cardiology Interventional radiology  DVT Prophylaxis : SCDs Start: 02/20/21 0002   Subjective:  Patient in bed, appears comfortable, denies any headache,  no fever, no chest pain or pressure, no shortness of breath , no focal weakness, still not passing flatus, abdominal and scrotal discomfort is improving.    Assessment/Plan:  Severe sepsis due to diverticulitis with contained perforation and small abscess: Sepsis physiology has resolved-although continues to have leukocytosis-remains on Zosyn.  Underwent CT-guided drainage of pelvic abscess on 9/8.  General surgery following-he underwent Ex.Laprotomy, sigmoid colectomy, end colostomy with enterorrhaphy and drainage of intraabdominal abscess - Dr. Kieth Brightly and Dr. Radene Knee  on 02/26/2021, continue TNA, NG tube to intermittent suction, supportive care and monitor.  He has postop Foley catheter, still in significant distress with Foley removal trial on 02/28/2021.  Ileus: Likely sequelae of diverticulitis-NG tube in place-no return of bowel function as of yet.  General surgery following.    AKI: Hemodynamically mediated-improved-creatinine has now normalized.  Hypokalemia/hypophosphatemia: Repleted and has normalized.  Continue to follow periodically.  PAF with RVR: RVR provoked by sepsis/alcohol withdrawal-currently in sinus rhythm-remains on amiodarone infusion until oral intake can be established.  On IV heparin-Xarelto on hold due to the small possibility that patient may require a laparotomy.  Cardiology following.  Initiate trial of oral beta-blocker and monitor.  Right scrotal pain and swelling due to epididymitis : Has been placed on doxycycline in addition to Zosyn, symptoms improving continue to monitor, denies any exposure or unprotected sex in the last few years, no unusual discharge.  ?Alcohol withdrawal: Awake and alert this morning-last drink was on 9/2, counseled to quit alcohol, no DTs.  HTN: Placed on oral beta-blocker and monitor.  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     Diet: Diet Order              Diet NPO time specified Except for: Sips with Meds  Diet effective now                    Code Status: Full code  Family Communication: Spouse-Jimmi [phone] updated at bedside on 9/9  Disposition Plan: Status is: Inpatient  Remains inpatient appropriate because:Inpatient level of care appropriate due to severity of illness  Dispo: The patient is from: Home              Anticipated d/c is to: Home              Patient currently is not medically stable to d/c.   Difficult to place patient No   Barriers to Discharge: Perforated diverticulitis with abscess-on IV antibiotics-developing alcohol withdrawal symptoms/PAF with RVR.  Antimicrobial agents: Anti-infectives (From admission, onward)    Start     Dose/Rate Route Frequency Ordered Stop   02/26/21 0100  doxycycline (VIBRAMYCIN) 100 mg in sodium chloride 0.9 % 250 mL IVPB        100 mg 125 mL/hr over 120 Minutes Intravenous 2 times daily 02/26/21 0005     02/20/21 0300  piperacillin-tazobactam (ZOSYN) IVPB 3.375 g        3.375 g 12.5 mL/hr over 240 Minutes Intravenous Every 8 hours 02/19/21 1907     02/19/21 1900  ceFEPIme (MAXIPIME) 2 g in sodium chloride 0.9 % 100 mL IVPB  Status:  Discontinued        2 g 200 mL/hr over 30 Minutes Intravenous  Once 02/19/21 1857 02/19/21 1906   02/19/21 1900  metroNIDAZOLE (FLAGYL) IVPB 500 mg  Status:  Discontinued        500 mg 100 mL/hr over 60 Minutes Intravenous  Once 02/19/21 1857 02/19/21 1906   02/19/21 1815  piperacillin-tazobactam (ZOSYN) IVPB 3.375 g        3.375 g 100 mL/hr over 30 Minutes Intravenous  Once 02/19/21 1809 02/19/21 1944        Time spent: 35 minutes-Greater than 50% of this time was spent in counseling, explanation of diagnosis, planning of further management, and coordination of care.  MEDICATIONS: Scheduled Meds:  Chlorhexidine Gluconate Cloth  6 each Topical Daily   folic acid  1 mg Per Tube Daily   insulin aspart   0-15 Units Subcutaneous Q4H   lidocaine  1 patch Transdermal Q24H   metoprolol tartrate  100 mg Oral BID   nicotine  21 mg Transdermal Daily   pantoprazole (PROTONIX) IV  40 mg Intravenous Q24H   sodium chloride flush  10 mL Intracatheter Q12H   thiamine  100 mg Per Tube Daily   Or   thiamine  100 mg Intravenous Daily   Continuous Infusions:  sodium chloride Stopped (02/27/21 0414)   acetaminophen 1,000 mg (02/27/21 0850)   amiodarone 30 mg/hr (02/27/21 [id number])   doxycycline (VIBRAMYCIN) IV 100 mg (02/27/21 0824)   heparin     lactated ringers with kcl 10 mL/hr at 02/27/21 0633   magnesium sulfate bolus IVPB 2 g (02/27/21 1112)   methocarbamol (ROBAXIN) IV 500 mg (02/27/21 1118)   piperacillin-tazobactam (ZOSYN)  IV 3.375 g (02/27/21 1113)   potassium PHOSPHATE IVPB (in mmol) 30 mmol (02/27/21 1120)   TPN ADULT (ION) 50 mL/hr at 02/27/21 0633   TPN ADULT (ION)     PRN Meds:.sodium chloride, hydrALAZINE, menthol-cetylpyridinium, metoprolol tartrate, morphine injection, [DISCONTINUED] ondansetron **OR** ondansetron (ZOFRAN) IV, oxyCODONE   PHYSICAL EXAM: Vital signs: Vitals:   02/27/21 0800 02/27/21 0900 02/27/21 1000 02/27/21 1100  BP: (!) 150/71 (!) 153/65 [name])  141/66   Pulse: (!) 136 91 82 (!) 51  Resp: 16   (!) 24  Temp: 98.6 F (37 C)     TempSrc: Oral     SpO2: 95% 95% 96% 96%  Weight:      Height:       Filed Weights   02/19/21 1800 02/20/21 2000 02/26/21 0913  Weight: 79.4 kg 79.4 kg 82.6 kg   Body mass index is 24.68 kg/m.   Awake Alert, No new F.N deficits, NG in place, colostomy in place, scrotal swelling improving [place].AT,PERRAL Supple Neck,No JVD, No cervical lymphadenopathy appriciated.  Symmetrical Chest wall movement, Good air movement bilaterally, CTAB RRR,No Gallops, Rubs or new Murmurs, No Parasternal Heave +ve B.Sounds,   No Cyanosis, Clubbing or edema, No new Rash or bruise    I have personally reviewed following labs and imaging  studies  LABORATORY DATA:  Recent Labs  Lab 02/23/21 0120 02/24/21 0356 02/25/21 0144 02/26/21 0135 02/27/21 0322  WBC 16.1* 14.6* 15.6* 16.6* 19.6*  HGB 13.3 12.8* 13.8 12.9* 12.4*  HCT 39.5 37.7* 39.5 37.3* 36.4*  PLT 254 286 337 334 355  MCV 101.8* 102.7* 101.0* 100.8* 100.6*  MCH 34.3* 34.9* 35.3* 34.9* 34.3*  MCHC 33.7 34.0 34.9 34.6 34.1  RDW 13.2 13.2 13.2 13.2 13.2    Recent Labs  Lab 02/23/21 0120 02/24/21 0356 02/25/21 0144 02/25/21 0957 02/26/21 0135 02/27/21 0322  NA 136 134* 135  --  134* 134*  K 3.7 4.6 4.0  --  3.9 4.1  CL 98 98 98  --  99 101  CO2 '30 24 26  '$ --  28 26  GLUCOSE 96 80 91  --  179* 191*  BUN '19 15 11  '$ --  14 13  CREATININE 1.32* 1.01 1.22  --  1.07 0.91  CALCIUM 8.5* 8.7* 8.3*  --  8.2* 8.1*  AST 15  --  12*  --   --   --   ALT 13  --  12  --   --   --   ALKPHOS 64  --  50  --   --   --   BILITOT 0.4  --  1.1  --   --   --   ALBUMIN 2.2*  --  2.0*  --   --   --   MG 2.0 2.0 1.8  --  2.0 1.9  HGBA1C  --   --   --  5.9*  --   --          RADIOLOGY STUDIES/RESULTS: DG Abd Portable 1V  Result Date: 02/26/2021 CLINICAL DATA:  Abdominal pain and constipation, still with nausea. EXAM: PORTABLE ABDOMEN - 1 VIEW COMPARISON:  February 25, 2021. FINDINGS: Bowel gas pattern with persistent small bowel distension up to 5.8 cm in the upper abdomen similar to the prior exam. Colon is collapsed distally. Nasogastric tube with side port at the level of the EG junction, perhaps slightly above EG junction. EKG leads projecting over the abdomen. Pigtail drainage catheter slightly to the LEFT of midline. Images slightly rotated to the LEFT however. On limited assessment there is no acute skeletal process. IMPRESSION: Persistent small bowel distension up to 5.8 cm in the upper abdomen, unchanged. Perhaps marked ileus due to inflammatory process in the pelvis though in the absence of significant colonic gas developing small-bowel obstruction is considered.  Suggest attention on follow-up. Nasogastric tube with side port at the level of the EG junction, perhaps slightly above  the EG junction. 3-4 cm advancement could be considered for more optimal placement. Electronically Signed   By: Zetta Bills M.D.   On: 02/26/2021 10:28   US SCROTUM W/DOPPLER  Result Date: 02/25/2021 CLINICAL DATA:  Right scrotal pain for 2 days EXAM: SCROTAL ULTRASOUND DOPPLER ULTRASOUND OF THE TESTICLES TECHNIQUE: Complete ultrasound examination of the testicles, epididymis, and other scrotal structures was performed. Color and spectral Doppler ultrasound were also utilized to evaluate blood flow to the testicles. COMPARISON:  CT 02/23/2021 FINDINGS: Right testicle Measurements: 3.9 x 2.4 x 2.2 cm. No mass or microlithiasis visualized. Left testicle Measurements: 3.5 x 2.2 x 2.4 cm. No mass or microlithiasis visualized. Right epididymis: Small epididymal cyst containing scattered echoes measuring 5 mm. Appears hyperemic. Left epididymis:  Normal in size.  Appears hyperemic at the tail. Hydrocele:  Large complex right hydrocele contains fluid level. Varicocele:  Small left varicocele Pulsed Doppler interrogation of both testes demonstrates normal low resistance arterial and venous waveforms bilaterally. IMPRESSION: 1. Negative for acute testicular torsion. 2. Heterogenous slightly hyperemic right greater than left epididymis suggesting epididymitis 3. Large complex right hydrocele contains fluid fluid levels; echogenic dependent more complex fluid could be secondary to blood, inflammation, or infection. 4. Small left varicocele Electronically Signed   By: Donavan Foil M.D.   On: 02/25/2021 18:52     LOS: 7 days   Signature  Lala Lund M.D on 02/27/2021 at 11:46 AM   -  To page go to [email or web address]    02/27/2021, 11:46 AM

## 2021-02-27 NOTE — Progress Notes (Signed)
PT Cancellation Note  Patient Details Name: Travis Conrad MRN: [id number] DOB: 24-Oct-1950   Cancelled Treatment:    Reason Eval/Treat Not Completed: Pain limiting ability to participate (pt reports pain 9/10 and currently awaiting medication from pharmacy)   [place] 02/27/2021, 8:47 AM Bayard Males, PT Acute Rehabilitation Services Pager: [phone] Office: [phone]

## 2021-02-27 NOTE — Progress Notes (Signed)
ANTICOAGULATION CONSULT NOTE - Follow Up Consult  Pharmacy Consult for Heparin Indication: atrial fibrillation  Allergies  Allergen Reactions   Atorvastatin Other (See Comments)    myalgias    Patient Measurements: Height: 6' (182.9 cm) Weight: 82.6 kg (182 lb) IBW/kg (Calculated) : 77.6 Heparin Dosing Weight: 79.4 kg  Vital Signs: Temp: 98 F (36.7 C) (09/11 0400) Temp Source: Oral (09/11 0400) BP: 173/74 (09/11 0400) Pulse Rate: 88 (09/11 0400)  Labs: Recent Labs    02/25/21 0144 02/25/21 0921 02/25/21 1804 02/26/21 0135 02/27/21 0322  HGB 13.8  --   --  12.9* 12.4*  HCT 39.5  --   --  37.3* 36.4*  PLT 337  --   --  334 355  HEPARINUNFRC 0.23* 0.22* 0.22*  --  <0.10*  CREATININE 1.22  --   --  1.07 0.91    Estimated Creatinine Clearance: 82.9 mL/min (by C-G formula based on SCr of 0.91 mg/dL).   Medications:  Scheduled:   Chlorhexidine Gluconate Cloth  6 each Topical Daily   folic acid  1 mg Per Tube Daily   insulin aspart  0-15 Units Subcutaneous Q4H   lidocaine  1 patch Transdermal Q24H   nicotine  21 mg Transdermal Daily   pantoprazole (PROTONIX) IV  40 mg Intravenous Q24H   sodium chloride flush  10 mL Intracatheter Q12H   thiamine  100 mg Per Tube Daily   Or   thiamine  100 mg Intravenous Daily    Assessment: 70 y/o male admitted for sepsis from contained perforated diverticulitis with active ileus. On PTA Xarelto for Afib with last dose on 9/2 at 2000.  On IV heparin-Xarelto on hold due to possibility that patient may require a laparotomy.         Repeat CT 9/7 with increased size of abscess, s/p IR drain 9/8. Surgery ordered to stop heparin drip MN 9/9 in prep for surgery on 9/10. Heparin was stopped at midnight on 9/9. Heparin levels have been subtherapeutic around 0.2s and the rate was increased before holding for surgery.   Heparin level was <0.1 9/11 AM since heparin has been off for about 27 hours d/t surgery. His previous heparin levels were  subtherapeutic prior to surgery and his rate was increased right before stopping. The new rate had never been tested for a level due to the surgery plan. Rechecking a level in 6 hours to assess dose.  Goal of Therapy:  Heparin level 0.3-0.7 units/ml Monitor platelets by anticoagulation protocol: Yes   Plan:  Start heparin infusion at 1850 units/hr Check anti-Xa level in 6 hours and daily while on heparin Continue to monitor H&H and platelets Daily HL and CBC Monitor for signs and symptoms of bleeding.   Varney Daily, PharmD PGY1 Pharmacy Resident  Please check AMION for all [facility] pharmacy phone numbers After 10:00 PM call main pharmacy [phone]

## 2021-02-27 NOTE — Progress Notes (Signed)
ANTICOAGULATION CONSULT NOTE - Follow Up Consult  Pharmacy Consult for Heparin Indication: atrial fibrillation  Allergies  Allergen Reactions   Atorvastatin Other (See Comments)    myalgias    Patient Measurements: Height: 6' (182.9 cm) Weight: 82.6 kg (182 lb) IBW/kg (Calculated) : 77.6 Heparin Dosing Weight: 79.4 kg  Vital Signs: Temp: 97.7 F (36.5 C) (09/11 2100) Temp Source: Oral (09/11 2100) BP: 172/88 (09/11 2100) Pulse Rate: 82 (09/11 2100)  Labs: Recent Labs    02/25/21 0144 02/25/21 0921 02/26/21 0135 02/27/21 0322 02/27/21 1432 02/27/21 2012  HGB 13.8  --  12.9* 12.4*  --   --   HCT 39.5  --  37.3* 36.4*  --   --   PLT 337  --  334 355  --   --   HEPARINUNFRC 0.23*   < >  --  <0.10* <0.10* 0.22*  CREATININE 1.22  --  1.07 0.91  --   --    < > = values in this interval not displayed.     Estimated Creatinine Clearance: 82.9 mL/min (by C-G formula based on SCr of 0.91 mg/dL).   Medications:  Scheduled:   Chlorhexidine Gluconate Cloth  6 each Topical Daily   folic acid  1 mg Per Tube Daily   insulin aspart  0-15 Units Subcutaneous Q4H   lidocaine  1 patch Transdermal Q24H   metoprolol tartrate  100 mg Oral BID   nicotine  21 mg Transdermal Daily   pantoprazole (PROTONIX) IV  40 mg Intravenous Q24H   sodium chloride flush  10 mL Intracatheter Q12H   thiamine  100 mg Per Tube Daily   Or   thiamine  100 mg Intravenous Daily    Assessment: 70 y/o male admitted for sepsis from contained perforated diverticulitis with active ileus. On PTA Xarelto for Afib with last dose on 9/2 at 2000.  On IV heparin-Xarelto on hold due to possibility that patient may require a laparotomy.         Repeat CT 9/7 with increased size of abscess, s/p IR drain 9/8. Surgery ordered to stop heparin drip MN 9/9 in prep for surgery on 9/10. Heparin was stopped at midnight on 9/9. Heparin levels have been subtherapeutic around 0.2s and the rate was increased before holding for  surgery.   Heparin level 0.22, was drawn a little early.  No overt bleeding or complications noted.  Goal of Therapy:  Heparin level 0.3-0.7 units/ml Monitor platelets by anticoagulation protocol: Yes   Plan:  Increase IV heparin to 1900 units/hr. Repeat heparin level with AM labs. Daily HL and CBC Monitor for signs and symptoms of bleeding.  [place] Crane, Roylene Reason, BCCP Clinical Pharmacist  02/27/2021 9:17 PM   [facility] pharmacy phone numbers are listed on [email or web address]

## 2021-02-27 NOTE — Evaluation (Signed)
Physical Therapy Evaluation Patient Details Name: Travis Conrad MRN: [id number] DOB: [birth year] Today's Date: 02/27/2021   History of Present Illness  70 yo admitted 9/3 with abdominal pain, vomiting and ileus. 9/4 Afib with RVR. 9/8 intrabdominal abscess s/p IR drain placed. 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 PMhx: HTN, HLD, Afib, ETOH use  Clinical Impression  Pt supine on arrival agreeable to mobility and aware of benefit post op. PT with pain limiting mobility with education for sequence of transfers and splinting abdomen with pillow. Pt with decreased strength, function and mobility who will benefit from acute therapy to maximize mobility, safety and function to decrease burden of care. Pt and wife Laverna Peace have been married 53 years and pt reports decreased activity and walking since diagnosis of Afib.   HR 104 SpO2 93% on RA BP 141/66 (87)    Follow Up Recommendations Home health PT    Equipment Recommendations  3in1 (PT)    Recommendations for Other Services OT consult     Precautions / Restrictions Precautions Precautions: Fall;Other (comment) Precaution Comments: wound vAC, jp drain, NGtube      Mobility  Bed Mobility Overal bed mobility: Needs Assistance Bed Mobility: Rolling;Sidelying to Sit Rolling: Mod assist Sidelying to sit: Mod assist       General bed mobility comments: cues for sequence with assist of pad to rotate pelvis and physical assist to lift trunk from surface. Cues for splinting abdomen with pillow. Increased time to scoot to EOB without physical assist    Transfers Overall transfer level: Needs assistance   Transfers: Sit to/from Stand;Stand Pivot Transfers Sit to Stand: Min assist Stand pivot transfers: Min assist       General transfer comment: min assist to rise from bed and bSC with cues for hand placement. Use of RW to pivot bed to [facility]  Ambulation/Gait Ambulation/Gait assistance: Min guard Gait Distance (Feet):  80 Feet Assistive device: Rolling walker (2 wheeled) Gait Pattern/deviations: Trunk flexed;Decreased stride length;Step-through pattern   Gait velocity interpretation: <1.8 ft/sec, indicate of risk for recurrent falls General Gait Details: pt with maintained hip and trunk flexion due to pain with cues for posture and position in RW. Pt able to self regulate activity tolerance  Stairs            Wheelchair Mobility    Modified Rankin (Stroke Patients Only)       Balance Overall balance assessment: Needs assistance Sitting-balance support: No upper extremity supported Sitting balance-Leahy Scale: Fair Sitting balance - Comments: able to sit without UE support   Standing balance support: Bilateral upper extremity supported Standing balance-Leahy Scale: Poor Standing balance comment: RW use for gait                             Pertinent Vitals/Pain Pain Assessment: 0-10 Pain Score: 6  Pain Location: abdominal incision Pain Descriptors / Indicators: Aching;Guarding Pain Intervention(s): Limited activity within patient's tolerance;Monitored during session;Premedicated before session;Repositioned    Home Living Family/patient expects to be discharged to:: Private residence Living Arrangements: Spouse/significant other Available Help at Discharge: Family;Available 24 hours/day Type of Home: House Home Access: Stairs to enter Entrance Stairs-Rails: Right Entrance Stairs-Number of Steps: 3 Home Layout: One level Home Equipment: Walker - 2 wheels;Cane - single point;Transport chair;Shower seat - built in;Bedside commode      Prior Function Level of Independence: Independent  Hand Dominance   Dominant Hand: Left    Extremity/Trunk Assessment   Upper Extremity Assessment Upper Extremity Assessment: Overall WFL for tasks assessed    Lower Extremity Assessment Lower Extremity Assessment: Overall WFL for tasks assessed    Cervical /  Trunk Assessment Cervical / Trunk Assessment: Other exceptions Cervical / Trunk Exceptions: guarding abdomen due to pain and incision  Communication   Communication: No difficulties  Cognition Arousal/Alertness: Awake/alert Behavior During Therapy: WFL for tasks assessed/performed Overall Cognitive Status: Within Functional Limits for tasks assessed                                        General Comments      Exercises     Assessment/Plan    PT Assessment Patient needs continued PT services  PT Problem List Decreased strength;Decreased mobility;Decreased activity tolerance;Decreased balance;Decreased knowledge of use of DME;Pain       PT Treatment Interventions Gait training;Stair training;Functional mobility training;Therapeutic activities;Patient/family education;DME instruction;Therapeutic exercise    PT Goals (Current goals can be found in the Care Plan section)  Acute Rehab PT Goals Patient Stated Goal: return home and be able to walk PT Goal Formulation: With patient/family Time For Goal Achievement: 03/13/21 Potential to Achieve Goals: Good    Frequency Min 3X/week   Barriers to discharge        Co-evaluation               AM-PAC PT "6 Clicks" Mobility  Outcome Measure Help needed turning from your back to your side while in a flat bed without using bedrails?: A Lot Help needed moving from lying on your back to sitting on the side of a flat bed without using bedrails?: A Lot Help needed moving to and from a bed to a chair (including a wheelchair)?: A Little Help needed standing up from a chair using your arms (e.g., wheelchair or bedside chair)?: A Little Help needed to walk in hospital room?: A Little Help needed climbing 3-5 steps with a railing? : A Lot 6 Click Score: 15    End of Session   Activity Tolerance: Patient tolerated treatment well;Patient limited by pain Patient left: in chair;with call bell/phone within reach;with  chair alarm set;with family/visitor present;with nursing/sitter in room Nurse Communication: Mobility status PT Visit Diagnosis: Other abnormalities of gait and mobility (R26.89);Difficulty in walking, not elsewhere classified (R26.2)    Time: [id number] PT Time Calculation (min) (ACUTE ONLY): 35 min   Charges:   PT Evaluation $PT Eval Moderate Complexity: 1 Mod PT Treatments $Gait Training: 8-22 mins        Kristin Barcus P, PT Acute Rehabilitation Services Pager: [phone] Office: [phone]   Sandy Salaam Need[place] Biggins 02/27/2021, 12:05 PM

## 2021-02-27 NOTE — Progress Notes (Signed)
Occupational Therapy Evaluation Patient Details Name: Travis Conrad MRN: [id number] DOB: 12-03-1950 Today's Date: 02/27/2021    History of Present Illness 70 yo admitted 9/3 with abdominal pain, vomiting and ileus. 9/4 Afib with RVR. 9/8 intrabdominal abscess s/p IR drain placed. 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 PMhx: HTN, HLD, Afib, ETOH use   Clinical Impression   Pt presents with above diagnosis. PTA pt PLOF living at home with wife, reports being ind with ADLs, reports not driving anymore due to visual deficits (see below for details). Pt is currently limited with safe ADL engagement due to pain, functional strength, and safety awareness. Pt will benefit to continue skilled level OT to address AE education, compensatory strategies, safety awareness, and functional mobility to ensure safe transition to DC setting.      Follow Up Recommendations  Home health OT    Equipment Recommendations  3 in 1 bedside commode (will continue to assess with progress)    Recommendations for Other Services       Precautions / Restrictions Precautions Precautions: Fall;Other (comment) Precaution Comments: wound vAC, jp drain, NGtube Restrictions Weight Bearing Restrictions: No      Mobility Bed Mobility Overal bed mobility: Needs Assistance Bed Mobility: Rolling;Sidelying to Sit Rolling: Mod assist Sidelying to sit: Mod assist       General bed mobility comments: Pt received in chair upon arrival.    Transfers Overall transfer level: Needs assistance   Transfers: Sit to/from Stand;Stand Pivot Transfers Sit to Stand: Min assist Stand pivot transfers: Min assist       General transfer comment: min assist to rise from recliner, cues for safe hand placement and sequencing to safe transition to standing wtih RW.    Balance Overall balance assessment: Needs assistance Sitting-balance support: No upper extremity supported Sitting balance-Leahy Scale:  Fair Sitting balance - Comments: able to sit without UE support   Standing balance support: Bilateral upper extremity supported Standing balance-Leahy Scale: Poor Standing balance comment: RW use for gait                           ADL either performed or assessed with clinical judgement   ADL Overall ADL's : Needs assistance/impaired Eating/Feeding: NPO Eating/Feeding Details (indicate cue type and reason): NG tube in place Grooming: Oral care;Wash/dry face;Wash/dry hands;Set up Grooming Details (indicate cue type and reason): not assessed however, set up and positioning will be required.         Upper Body Dressing : Min guard Upper Body Dressing Details (indicate cue type and reason): not assessed, however, will infer min guard due to pain limited engagement. Lower Body Dressing: Moderate assistance Lower Body Dressing Details (indicate cue type and reason): Pt unable to demonstrate reaching forward or presenting sock to figure 4 position due to pain and discomfort.                     Vision Ability to See in Adequate Light: 2 Moderately impaired Patient Visual Report: Blurring of vision (pt reports limitation with vision, complete blurriness of L eye. Clearer vision with R eye however, reports seeing "spots". reports not driving due to visual deficits.)       Perception     Praxis      Pertinent Vitals/Pain Pain Assessment: 0-10 Pain Score: 7  Pain Location: abdominal incision Pain Descriptors / Indicators: Aching;Guarding Pain Intervention(s): Limited activity within patient's tolerance;Monitored during session;Repositioned;Premedicated before session  Hand Dominance Left   Extremity/Trunk Assessment Upper Extremity Assessment Upper Extremity Assessment: Overall WFL for tasks assessed   Lower Extremity Assessment Lower Extremity Assessment: Defer to PT evaluation   Cervical / Trunk Assessment Cervical / Trunk Assessment: Other  exceptions Cervical / Trunk Exceptions: guarding abdomen due to pain and incision   Communication Communication Communication: No difficulties   Cognition Arousal/Alertness: Awake/alert Behavior During Therapy: WFL for tasks assessed/performed Overall Cognitive Status: Within Functional Limits for tasks assessed                                 General Comments: HOH   General Comments       Exercises     Shoulder Instructions      Home Living Family/patient expects to be discharged to:: Private residence Living Arrangements: Spouse/significant other Available Help at Discharge: Family;Available 24 hours/day Type of Home: House Home Access: Stairs to enter [employer] of Steps: 3 Entrance Stairs-Rails: Right Home Layout: One level     Bathroom Shower/Tub: Occupational psychologist: Standard     Home Equipment: Environmental consultant - 2 wheels;Cane - single point;Transport chair;Shower seat - built in;Bedside commode   Additional Comments: wife reports being avaible 24/7 to assist      Prior Functioning/Environment Level of Independence: Independent                 OT Problem List: Decreased activity tolerance;Impaired balance (sitting and/or standing);Decreased safety awareness;Decreased knowledge of use of DME or AE;Decreased knowledge of precautions;Pain      OT Treatment/Interventions: Self-care/ADL training;Therapeutic exercise;DME and/or AE instruction;Therapeutic activities;Patient/family education;Balance training    OT Goals(Current goals can be found in the care plan section) Acute Rehab OT Goals Patient Stated Goal: return home and be able to walk OT Goal Formulation: With patient Time For Goal Achievement: 03/13/21 Potential to Achieve Goals: Good  OT Frequency: Min 2X/week   Barriers to D/C:            Co-evaluation              AM-PAC OT "6 Clicks" Daily Activity     Outcome Measure Help from another person  eating meals?: A Little Help from another person taking care of personal grooming?: A Little Help from another person toileting, which includes using toliet, bedpan, or urinal?: A Little Help from another person bathing (including washing, rinsing, drying)?: A Lot Help from another person to put on and taking off regular upper body clothing?: A Little Help from another person to put on and taking off regular lower body clothing?: A Lot 6 Click Score: 16   End of Session Equipment Utilized During Treatment: Gait belt;Rolling walker Nurse Communication: Mobility status  Activity Tolerance: Patient limited by pain Patient left: in chair;with call bell/phone within reach;with chair alarm set;with family/visitor present  OT Visit Diagnosis: Muscle weakness (generalized) (M62.81);Pain                Time: [id number] OT Time Calculation (min): 27 min Charges:  OT General Charges $OT Visit: 1 Visit OT Evaluation $OT Eval Moderate Complexity: [place], MSOT, OTR/L  Supplemental Rehabilitation Services  [phone]   Marius Ditch 02/27/2021, 12:50 PM

## 2021-02-27 NOTE — Progress Notes (Addendum)
1 Day Post-Op  Subjective: Has had some worsening LLQ abdominal pain this am. No nausea or emesis but he is belching. Foley catheter in place  Objective: Vital signs in last 24 hours: Temp:  [97.4 F (36.3 C)-98.5 F (36.9 C)] 98 F (36.7 C) (09/11 0400) Pulse Rate:  [78-115] 88 (09/11 0400) Resp:  [13-20] 18 (09/11 0400) BP: (105-175)/(62-91) 173/74 (09/11 0400) SpO2:  [92 %-99 %] 94 % (09/11 0400) Weight:  [82.6 kg] 82.6 kg (09/10 0913) Last BM Date: 02/22/21  Intake/Output from previous day: 09/10 0701 - 09/11 0700 In: 2677.6 [I.V.:2314.2; IV Piggyback:363.3] Out: 2339 [Urine:1300; Emesis/NG output:400; Drains:234; Stool:5; Blood:400] Intake/Output this shift: No intake/output data recorded.  PE: Gen: NAD, resting in bed comfortably Heart: regular rate and rhythm this am Lungs: respiratory effort nonlabored Abd: soft, +BS. Mildly distended. Expected post operative tenderness greatest around drain site in LLQ and near midline incision. Incision with vac in place with good suction. Drain with serosanguinous output. Colostomy with small amount of serosanguinous drainage - no gas. NGT with bilious output MSK: all 4 extremities symmetric without cyanosis or edema. No calf TTP bilaterally   Lab Results:  Recent Labs    02/26/21 0135 02/27/21 0322  WBC 16.6* 19.6*  HGB 12.9* 12.4*  HCT 37.3* 36.4*  PLT 334 355    BMET Recent Labs    02/26/21 0135 02/27/21 0322  NA 134* 134*  K 3.9 4.1  CL 99 101  CO2 28 26  GLUCOSE 179* 191*  BUN 14 13  CREATININE 1.07 0.91  CALCIUM 8.2* 8.1*    PT/INR No results for input(s): LABPROT, INR in the last 72 hours.  CMP     Component Value Date/Time   NA 134 (L) 02/27/2021 0322   NA 134 09/30/2019 0959   NA 142 03/03/2016 0921   K 4.1 02/27/2021 0322   K 4.3 03/03/2016 0921   CL 101 02/27/2021 0322   CO2 26 02/27/2021 0322   CO2 25 03/03/2016 0921   GLUCOSE 191 (H) 02/27/2021 0322   GLUCOSE 89 03/03/2016 0921    BUN 13 02/27/2021 0322   BUN 11 09/30/2019 0959   BUN 13.6 03/03/2016 0921   CREATININE 0.91 02/27/2021 0322   CREATININE 1.2 03/03/2016 0921   CALCIUM 8.1 (L) 02/27/2021 0322   CALCIUM 9.6 03/03/2016 0921   PROT 5.3 (L) 02/25/2021 0144   PROT 7.3 03/03/2016 0921   ALBUMIN 2.0 (L) 02/25/2021 0144   ALBUMIN 3.8 03/03/2016 0921   AST 12 (L) 02/25/2021 0144   AST 36 (H) 03/03/2016 0921   ALT 12 02/25/2021 0144   ALT 31 03/03/2016 0921   ALKPHOS 50 02/25/2021 0144   ALKPHOS 116 03/03/2016 0921   BILITOT 1.1 02/25/2021 0144   BILITOT 0.71 03/03/2016 0921   GFRNONAA >60 02/27/2021 0322   GFRNONAA 67 07/25/2013 1712   GFRAA >60 10/03/2019 0930   GFRAA 77 07/25/2013 1712   Lipase     Component Value Date/Time   LIPASE 22 02/19/2021 1653       Studies/Results: DG Abd Portable 1V  Result Date: 02/26/2021 CLINICAL DATA:  Abdominal pain and constipation, still with nausea. EXAM: PORTABLE ABDOMEN - 1 VIEW COMPARISON:  February 25, 2021. FINDINGS: Bowel gas pattern with persistent small bowel distension up to 5.8 cm in the upper abdomen similar to the prior exam. Colon is collapsed distally. Nasogastric tube with side port at the level of the EG junction, perhaps slightly above EG junction. EKG leads projecting  over the abdomen. Pigtail drainage catheter slightly to the LEFT of midline. Images slightly rotated to the LEFT however. On limited assessment there is no acute skeletal process. IMPRESSION: Persistent small bowel distension up to 5.8 cm in the upper abdomen, unchanged. Perhaps marked ileus due to inflammatory process in the pelvis though in the absence of significant colonic gas developing small-bowel obstruction is considered. Suggest attention on follow-up. Nasogastric tube with side port at the level of the EG junction, perhaps slightly above the EG junction. 3-4 cm advancement could be considered for more optimal placement. Electronically Signed   By: Zetta Bills M.D.   On:  02/26/2021 10:28   DG Abd Portable 1V  Result Date: 02/25/2021 CLINICAL DATA:  Epigastric abdominal pain. EXAM: PORTABLE ABDOMEN - 1 VIEW COMPARISON:  February 21, 2021. FINDINGS: Distal tip of nasogastric tube is seen in expected position of proximal stomach. Small bowel dilatation is noted concerning for distal small bowel obstruction or possibly ileus. Percutaneous drainage catheter is noted in the pelvis. IMPRESSION: Distal tip of nasogastric tube seen in expected position of proximal stomach. Small bowel dilatation is noted concerning for distal small bowel obstruction or possibly ileus. Electronically Signed   By: Marijo Conception M.D.   On: 02/25/2021 12:20   US SCROTUM W/DOPPLER  Result Date: 02/25/2021 CLINICAL DATA:  Right scrotal pain for 2 days EXAM: SCROTAL ULTRASOUND DOPPLER ULTRASOUND OF THE TESTICLES TECHNIQUE: Complete ultrasound examination of the testicles, epididymis, and other scrotal structures was performed. Color and spectral Doppler ultrasound were also utilized to evaluate blood flow to the testicles. COMPARISON:  CT 02/23/2021 FINDINGS: Right testicle Measurements: 3.9 x 2.4 x 2.2 cm. No mass or microlithiasis visualized. Left testicle Measurements: 3.5 x 2.2 x 2.4 cm. No mass or microlithiasis visualized. Right epididymis: Small epididymal cyst containing scattered echoes measuring 5 mm. Appears hyperemic. Left epididymis:  Normal in size.  Appears hyperemic at the tail. Hydrocele:  Large complex right hydrocele contains fluid level. Varicocele:  Small left varicocele Pulsed Doppler interrogation of both testes demonstrates normal low resistance arterial and venous waveforms bilaterally. IMPRESSION: 1. Negative for acute testicular torsion. 2. Heterogenous slightly hyperemic right greater than left epididymis suggesting epididymitis 3. Large complex right hydrocele contains fluid fluid levels; echogenic dependent more complex fluid could be secondary to blood, inflammation, or  infection. 4. Small left varicocele Electronically Signed   By: Donavan Foil M.D.   On: 02/25/2021 18:52    Anti-infectives: Anti-infectives (From admission, onward)    Start     Dose/Rate Route Frequency Ordered Stop   02/26/21 0100  doxycycline (VIBRAMYCIN) 100 mg in sodium chloride 0.9 % 250 mL IVPB        100 mg 125 mL/hr over 120 Minutes Intravenous 2 times daily 02/26/21 0005     02/20/21 0300  piperacillin-tazobactam (ZOSYN) IVPB 3.375 g        3.375 g 12.5 mL/hr over 240 Minutes Intravenous Every 8 hours 02/19/21 1907     02/19/21 1900  ceFEPIme (MAXIPIME) 2 g in sodium chloride 0.9 % 100 mL IVPB  Status:  Discontinued        2 g 200 mL/hr over 30 Minutes Intravenous  Once 02/19/21 1857 02/19/21 1906   02/19/21 1900  metroNIDAZOLE (FLAGYL) IVPB 500 mg  Status:  Discontinued        500 mg 100 mL/hr over 60 Minutes Intravenous  Once 02/19/21 1857 02/19/21 1906   02/19/21 1815  piperacillin-tazobactam (ZOSYN) IVPB 3.375 g  3.375 g 100 mL/hr over 30 Minutes Intravenous  Once 02/19/21 1809 02/19/21 1944        Assessment/Plan Acute sigmoid diverticulitis with contained perforation and abscess POD1 s/p ex lap, sigmoid colectomy, end colostomy with enterorrhaphy and drainage of intraabdominal abscess - Dr. Kieth Brightly and Dr. Radene Knee 9/10 - cont abx therapy - drain output 262m, continue drain - Wound vac MWF.  - WOC consulted for new ostomy - Await bowel function and keep NGT to LIWS today - WBC 19.6 (16.6), hgn 12.4 (12.9) - encouraged ambulation, use IS, multimodal pain control (added scheduled tylenol and robaxin)  FEN - NGT LIWS, NPO sips/chips, IVF, TPN VTE - heparin gtt held for surgery - can resume today ID - zosyn 9/4>>, doxycycline (orchitis)  A fib AKI Hypokalemia HTN ETOH withdrawal   LOS: 7 days    MWinferd Humphrey, [facility] 02/27/2021, 7:40 AM Please see Amion for pager number during day hours 7:00am-4:30pm or 7:00am -11:30am  on weekends

## 2021-02-28 DIAGNOSIS — I482 Chronic atrial fibrillation, unspecified: Secondary | ICD-10-CM | POA: Diagnosis not present

## 2021-02-28 LAB — AEROBIC/ANAEROBIC CULTURE W GRAM STAIN (SURGICAL/DEEP WOUND)

## 2021-02-28 LAB — COMPREHENSIVE METABOLIC PANEL
ALT: 10 U/L (ref 0–44)
AST: 17 U/L (ref 15–41)
Albumin: 1.7 g/dL — ABNORMAL LOW (ref 3.5–5.0)
Alkaline Phosphatase: 50 U/L (ref 38–126)
Anion gap: 8 (ref 5–15)
BUN: 13 mg/dL (ref 8–23)
CO2: 26 mmol/L (ref 22–32)
Calcium: 8.3 mg/dL — ABNORMAL LOW (ref 8.9–10.3)
Chloride: 101 mmol/L (ref 98–111)
Creatinine, Ser: 0.97 mg/dL (ref 0.61–1.24)
GFR, Estimated: >60 mL/min (ref >60)
Glucose, Bld: 128 mg/dL — ABNORMAL HIGH (ref 70–99)
Potassium: 4 mmol/L (ref 3.5–5.1)
Sodium: 135 mmol/L (ref 135–145)
Total Bilirubin: 0.4 mg/dL (ref 0.3–1.2)
Total Protein: 5 g/dL — ABNORMAL LOW (ref 6.5–8.1)

## 2021-02-28 LAB — PHOSPHORUS: Phosphorus: 3.6 mg/dL (ref 2.5–4.6)

## 2021-02-28 LAB — MAGNESIUM: Magnesium: 1.9 mg/dL (ref 1.7–2.4)

## 2021-02-28 LAB — GLUCOSE, CAPILLARY
Glucose-Capillary: 114 mg/dL — ABNORMAL HIGH (ref 70–99)
Glucose-Capillary: 118 mg/dL — ABNORMAL HIGH (ref 70–99)
Glucose-Capillary: 122 mg/dL — ABNORMAL HIGH (ref 70–99)
Glucose-Capillary: 131 mg/dL — ABNORMAL HIGH (ref 70–99)
Glucose-Capillary: 135 mg/dL — ABNORMAL HIGH (ref 70–99)
Glucose-Capillary: 156 mg/dL — ABNORMAL HIGH (ref 70–99)

## 2021-02-28 LAB — TRIGLYCERIDES: Triglycerides: 79 mg/dL (ref <150)

## 2021-02-28 LAB — HEPARIN LEVEL (UNFRACTIONATED): Heparin Unfractionated: 0.53 IU/mL (ref 0.30–0.70)

## 2021-02-28 MED ORDER — AMLODIPINE BESYLATE 10 MG PO TABS: 10.0000 mg | ORAL_TABLET | Freq: Every day | ORAL | Status: DC

## 2021-02-28 MED ORDER — AMLODIPINE BESYLATE 10 MG PO TABS
10.0000 mg | ORAL_TABLET | Freq: Every day | ORAL | Status: DC
  Administered 2021-02-28 – 2021-03-14 (×13): 10 mg
  Filled 2021-02-28 (×17): qty 1

## 2021-02-28 MED ORDER — OXYCODONE HCL 5 MG/5ML PO SOLN: 5.0000 mg | Freq: Four times a day (QID) | ORAL | Status: DC | PRN

## 2021-02-28 MED ORDER — METOPROLOL TARTRATE 100 MG PO TABS
100.0000 mg | ORAL_TABLET | Freq: Two times a day (BID) | ORAL | Status: DC
  Administered 2021-02-28 – 2021-03-15 (×28): 100 mg
  Filled 2021-02-28 (×31): qty 1

## 2021-02-28 MED ORDER — TRAVASOL 10 % IV SOLN
INTRAVENOUS | Status: AC
  Filled 2021-02-28: qty 1020

## 2021-02-28 NOTE — TOC CAGE-AID Note (Signed)
Transition of Care [facility]) - CAGE-AID Screening   Patient Details  Name: Travis Conrad MRN: [id number] Date of Birth: [birth year]  Transition of Care [place] [facility]) CM/SW Contact:    Joanne Chars, LCSW Phone Number: 02/28/2021, 10:10 AM   Clinical Narrative: CSW met with pt to complete Cage Aid.  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  Pt denies and drug use.  Pt denies history of treatment.  Pt denies that his current ETOH use is causing problems, said it helps him get to sleep at night.  Pt denies interest in quitting or need for treatment.  Treatment provider list provided.  CSW had previously spoken with wife on 9/6 and she denied that pt ETOH use was a concern, calling reports of pt having a drinking problem "overblown."    CAGE-AID Screening: Substance Abuse Screening unable to be completed due to: : Patient unable to participate  Have You Ever Felt You Ought to Cut Down on Your Drinking or Drug Use?: Yes Have People Annoyed You By Critizing Your Drinking Or Drug Use?: No Have You Felt Bad Or Guilty About Your Drinking Or Drug Use?: No Have You Ever Had a Drink or Used Drugs First Thing In The Morning to Steady Your Nerves or to Get Rid of a Hangover?: No CAGE-AID Score: 1  Substance Abuse Education Offered: Yes  Substance abuse interventions: Patient Counseling, Other (must comment) (list of treatment providers)

## 2021-02-28 NOTE — TOC Initial Note (Signed)
Transition of Care [facility]) - Initial/Assessment Note    Patient Details  Name: Travis Conrad MRN: [id number] Date of Birth: Apr 24, 1951  Transition of Care [facility]) CM/SW Contact:    Joanne Chars, LCSW Phone Number: 02/28/2021, 10:18 AM  Clinical Narrative:  CSW met with pt regarding recommendation for [facility].  Pt agreeable, choice document given, pt would like CSW to speak with his wife Jimmi about agency choice.  Permission given to speak with wife.  PCP in place.  Current DME in home: walker.  Pt would also like 3n1 as recommended by PT.  Pt is vaccinated for covid with one booster.    CSW also completed Cage aid screening on this date.  See separate note.                  Expected Discharge Plan: [place] Barriers to Discharge: Continued Medical Work up   Patient Goals and CMS Choice Patient states their goals for this hospitalization and ongoing recovery are:: "back to normal" CMS Medicare.gov Compare Post Acute Care list provided to:: Patient Choice offered to / list presented to : Patient  Expected Discharge Plan and Services Expected Discharge Plan: [place] In-house Referral: Clinical Social Work   Post Acute Care Choice: [place] arrangements for the past 2 months: [place]                                      Prior Living Arrangements/Services Living arrangements for the past 2 months: Single Family Home Lives with:: Spouse Patient language and need for interpreter reviewed:: Yes Do you feel safe going back to the place where you live?: Yes      Need for Family Participation in Patient Care: No (Comment) Care giver support system in place?: Yes (comment) Current home services: Other (comment) (na) Criminal Activity/Legal Involvement Pertinent to Current Situation/Hospitalization: No - Comment as needed  Activities of Daily Living Home Assistive Devices/Equipment: Cane (specify quad or straight)  (doesn't use cane at home) ADL Screening (condition at time of admission) Patient's cognitive ability adequate to safely complete daily activities?: Yes Is the patient deaf or have difficulty hearing?: No Does the patient have difficulty seeing, even when wearing glasses/contacts?: Yes Does the patient have difficulty concentrating, remembering, or making decisions?: No Patient able to express need for assistance with ADLs?: Yes Does the patient have difficulty dressing or bathing?: No Independently performs ADLs?: Yes (appropriate for developmental age) Does the patient have difficulty walking or climbing stairs?: Yes Weakness of Legs: Both Weakness of Arms/Hands: Both  Permission Sought/Granted Permission sought to share information with : Family Supports Permission granted to share information with : Yes, Verbal Permission Granted  Share Information with NAME: wife Jimmi  Permission granted to share info w AGENCY: HH        Emotional Assessment Appearance:: Appears stated age Attitude/Demeanor/Rapport: Engaged Affect (typically observed): Appropriate, Pleasant Orientation: : Oriented to Self, Oriented to Place, Oriented to  Time, Oriented to Situation Alcohol / Substance Use: Alcohol Use Psych Involvement: No (comment)  Admission diagnosis:  Diverticulitis [K57.92] Severe sepsis with acute organ dysfunction ([place]) [A41.9, R65.20] Sepsis with acute renal failure, due to unspecified organism, unspecified acute renal failure type, unspecified whether septic shock present ([place]) [A41.9, R65.20, N17.9] Patient Active Problem List   Diagnosis Date Noted   Diverticulitis with obstruction ([place]) 02/20/2021  AKI (acute kidney injury) ([place]) 02/20/2021   Severe sepsis with acute organ dysfunction ([place]) 02/20/2021   Diverticulitis of large intestine with perforation and abscess without bleeding 02/19/2021   Secondary hypercoagulable state ([place]) 11/03/2019   Acute diverticulitis 11/19/2016    Atrial fibrillation, chronic ([place]) 11/19/2016   Elevated cholesterol 11/19/2016   Essential hypertension 11/19/2016   Paroxysmal atrial fibrillation ([place]) 08/14/2016   PCP:  [name], MD Pharmacy:   [facility] [place] - [address] [place] [address] [place] [address] [place] [id number] Phone: [phone] Fax: [phone]     Social Determinants of Health (SDOH) Interventions    Readmission Risk Interventions No flowsheet data found.

## 2021-02-28 NOTE — Progress Notes (Signed)
Progress Note  Patient Name: Travis Conrad Date of Encounter: 02/28/2021  [facility] Cardiologist: [name], MD   Subjective   Being cleaned up by nurses   Inpatient Medications    Scheduled Meds:  Chlorhexidine Gluconate Cloth  6 each Topical Daily   folic acid  1 mg Per Tube Daily   insulin aspart  0-15 Units Subcutaneous Q4H   lidocaine  1 patch Transdermal Q24H   metoprolol tartrate  100 mg Oral BID   nicotine  21 mg Transdermal Daily   pantoprazole (PROTONIX) IV  40 mg Intravenous Q24H   sodium chloride flush  10 mL Intracatheter Q12H   thiamine  100 mg Per Tube Daily   Or   thiamine  100 mg Intravenous Daily   Continuous Infusions:  sodium chloride Stopped (02/27/21 0414)   amiodarone 30 mg/hr (02/28/21 0500)   doxycycline (VIBRAMYCIN) IV 100 mg (02/27/21 2300)   heparin 1,900 Units/hr (02/28/21 0215)   lactated ringers with kcl 10 mL/hr at 02/27/21 0633   methocarbamol (ROBAXIN) IV 500 mg (02/28/21 0500)   piperacillin-tazobactam (ZOSYN)  IV 3.375 g (02/28/21 0216)   TPN ADULT (ION) 60 mL/hr at 02/27/21 1747   PRN Meds: sodium chloride, hydrALAZINE, menthol-cetylpyridinium, metoprolol tartrate, morphine injection, [DISCONTINUED] ondansetron **OR** ondansetron (ZOFRAN) IV, oxyCODONE   Vital Signs    Vitals:   02/27/21 2100 02/28/21 0000 02/28/21 0500 02/28/21 0800  BP: (!) 172/88 (!) 159/71 (!) 157/71 (!) 182/77  Pulse: 82 64 64 66  Resp: '18 14 16 17  '$ Temp: 97.7 F (36.5 C)  98.3 F (36.8 C) 98 F (36.7 C)  TempSrc: Oral  Oral Oral  SpO2: 97% 97% 97% 98%  Weight:      Height:        Intake/Output Summary (Last 24 hours) at 02/28/2021 0909 Last data filed at 02/28/2021 0700 Gross per 24 hour  Intake 1277.33 ml  Output 1745 ml  Net -467.67 ml   Last 3 Weights 02/26/2021 02/20/2021 02/19/2021  Weight (lbs) 182 lb 175 lb 0.7 oz 175 lb 0.7 oz  Weight (kg) 82.555 kg 79.4 kg 79.4 kg      Telemetry    Sinus- Personally Reviewed  Physical Exam    GEN: NAD Neck: Supple Cardiac: RRR, no rub Respiratory: CTA GI: Slightly distended, post surgery with colostomy  MS: No edema Neuro:  Grossly intact Psych: Normal affect   Labs    Chemistry Recent Labs  Lab 02/23/21 0120 02/24/21 0356 02/25/21 0144 02/26/21 0135 02/27/21 0322 02/28/21 0440  NA 136   < > 135 134* 134* 135  K 3.7   < > 4.0 3.9 4.1 4.0  CL 98   < > 98 99 101 101  CO2 30   < > '26 28 26 26  '$ GLUCOSE 96   < > 91 179* 191* 128*  BUN 19   < > '11 14 13 13  '$ CREATININE 1.32*   < > 1.22 1.07 0.91 0.97  CALCIUM 8.5*   < > 8.3* 8.2* 8.1* 8.3*  PROT 5.4*  --  5.3*  --   --  5.0*  ALBUMIN 2.2*  --  2.0*  --   --  1.7*  AST 15  --  12*  --   --  17  ALT 13  --  12  --   --  10  ALKPHOS 64  --  50  --   --  50  BILITOT 0.4  --  1.1  --   --  0.4  GFRNONAA 58*   < > >60 >60 >60 >60  ANIONGAP 8   < > '11 7 7 8   '$ < > = values in this interval not displayed.     Hematology Recent Labs  Lab 02/25/21 0144 02/26/21 0135 02/27/21 0322  WBC 15.6* 16.6* 19.6*  RBC 3.91* 3.70* 3.62*  HGB 13.8 12.9* 12.4*  HCT 39.5 37.3* 36.4*  MCV 101.0* 100.8* 100.6*  MCH 35.3* 34.9* 34.3*  MCHC 34.9 34.6 34.1  RDW 13.2 13.2 13.2  PLT 337 334 355     Radiology    No results found.  Patient Profile     70 y.o. male with past medical history of paroxysmal atrial fibrillation status post ablation, hypertension, hyperlipidemia admitted with perforated diverticula/abscess for evaluation of recurrent atrial fibrillation.  Assessment & Plan    1 paroxysmal atrial fibrillation-patient remains in sinus rhythm this morning.  Continue IV amiodarone for now.  Once he is tolerating oral medications will transition to 200 mg daily.  Continue IV heparin and once all procedures are complete we will transition back to Xarelto.  F/U echo reviewed 02/23/21 EF 60-65% no significant valve disease   2 perforated diverticula with abscess-management per general surgery. Post colostomy   3 acute  kidney injury-creatinine unchanged today.  We will continue to follow.  4 hypokalemia-resolved  5 alcohol withdrawal-Per primary service.  For questions or updates, please contact [place] Please consult [email or web address] for contact info under        Signed, Jenkins Rouge, MD  02/28/2021, 9:09 AM   Patient ID: Travis Conrad, male   DOB: 05-04-1951, 70 y.o.   MRN: [id number]

## 2021-02-28 NOTE — Progress Notes (Signed)
Occupational Therapy Treatment Patient Details Name: Travis Conrad MRN: [id number] DOB: March 27, 1951 Today's Date: 02/28/2021   History of present illness 70 yo admitted 9/3 with abdominal pain, vomiting and ileus. 9/4 Afib with RVR. 9/8 intrabdominal abscess s/p IR drain placed. 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 PMhx: HTN, HLD, Afib, ETOH use   OT comments  Pt. Was ed on increasing I and safety with ADLs and mobility. Pt. Blood pressure was elevated and nursing advised therapy to not sit pt. Eob or transfer oob. Nursing ok working with pt. In supine with hob elevated. Pt. Worked with ot and was cooperative with adls. Acute ot to follow.    Recommendations for follow up therapy are one component of a multi-disciplinary discharge planning process, led by the attending physician.  Recommendations may be updated based on patient status, additional functional criteria and insurance authorization.    Follow Up Recommendations  Home health OT    Equipment Recommendations  3 in 1 bedside commode    Recommendations for Other Services      Precautions / Restrictions Precautions Precautions: Fall;Other (comment) Precaution Comments: wound vAC, jp drain, NGtube Restrictions Weight Bearing Restrictions: No       Mobility Bed Mobility                    Transfers                      Balance                                           ADL either performed or assessed with clinical judgement   ADL Overall ADL's : Needs assistance/impaired Eating/Feeding: NPO   Grooming: Wash/dry hands;Wash/dry face;Oral care;Brushing hair;Set up;Bed level (hob elevated.)           Upper Body Dressing : Minimal assistance;Bed level   Lower Body Dressing: Total assistance;Bed level                 General ADL Comments: Pt. had elevated blood pressure and nursing advised to not get him OOB. nursing ok working with him in bed with hob  elevated.     Vision   Vision Assessment?: No apparent visual deficits   Perception     Praxis      Cognition Arousal/Alertness: Awake/alert Behavior During Therapy: WFL for tasks assessed/performed Overall Cognitive Status: Within Functional Limits for tasks assessed                                 General Comments: HOH        Exercises     Shoulder Instructions       General Comments      Pertinent Vitals/ Pain       Pain Assessment: 0-10 Pain Score: 7  Pain Location: abdominal incision Pain Descriptors / Indicators: Aching;Guarding Pain Intervention(s): Patient requesting pain meds-RN notified  Home Living                                          Prior Functioning/Environment              Frequency  Min 2X/week  Progress Toward Goals  OT Goals(current goals can now be found in the care plan section)  Progress towards OT goals: Progressing toward goals  Acute Rehab OT Goals Patient Stated Goal: return home and be able to walk OT Goal Formulation: With patient Time For Goal Achievement: 03/13/21 Potential to Achieve Goals: Good  Plan Discharge plan remains appropriate    Co-evaluation                 AM-PAC OT "6 Clicks" Daily Activity     Outcome Measure   Help from another person eating meals?: Total Help from another person taking care of personal grooming?: A Little Help from another person toileting, which includes using toliet, bedpan, or urinal?: A Lot Help from another person bathing (including washing, rinsing, drying)?: A Lot Help from another person to put on and taking off regular upper body clothing?: A Little Help from another person to put on and taking off regular lower body clothing?: Total 6 Click Score: 12    End of Session    OT Visit Diagnosis: Muscle weakness (generalized) (M62.81);Pain   Activity Tolerance Treatment limited secondary to medical complications  (Comment)   Patient Left in bed;with bed alarm set;with call bell/phone within reach;with family/visitor present   Nurse Communication Patient requests pain meds (blood pressure elvated and nursing did not want him to sit eob or get oob.)        Time: 1344-1420 OT Time Calculation (min): 36 min  Charges: OT General Charges $OT Visit: 1 Visit OT Treatments $Self Care/Home Management : 23-37 mins  .ste  Vasilis Luhman 02/28/2021, 2:31 PM

## 2021-02-28 NOTE — Progress Notes (Signed)
ANTICOAGULATION CONSULT NOTE - Follow Up Consult  Pharmacy Consult for Heparin Indication: atrial fibrillation  Allergies  Allergen Reactions   Atorvastatin Other (See Comments)    myalgias    Patient Measurements: Height: 6' (182.9 cm) Weight: 82.6 kg (182 lb) IBW/kg (Calculated) : 77.6 Heparin Dosing Weight: 79.4 kg  Vital Signs: Temp: 98.3 F (36.8 C) (09/12 0500) Temp Source: Oral (09/12 0500) BP: 157/71 (09/12 0500) Pulse Rate: 64 (09/12 0500)  Labs: Recent Labs    02/26/21 0135 02/27/21 0322 02/27/21 1432 02/27/21 2012 02/28/21 0440  HGB 12.9* 12.4*  --   --   --   HCT 37.3* 36.4*  --   --   --   PLT 334 355  --   --   --   HEPARINUNFRC  --  <0.10* <0.10* 0.22* 0.53  CREATININE 1.07 0.91  --   --  0.97     Estimated Creatinine Clearance: 77.8 mL/min (by C-G formula based on SCr of 0.97 mg/dL).   Medications:  Scheduled:   Chlorhexidine Gluconate Cloth  6 each Topical Daily   folic acid  1 mg Per Tube Daily   insulin aspart  0-15 Units Subcutaneous Q4H   lidocaine  1 patch Transdermal Q24H   metoprolol tartrate  100 mg Oral BID   nicotine  21 mg Transdermal Daily   pantoprazole (PROTONIX) IV  40 mg Intravenous Q24H   sodium chloride flush  10 mL Intracatheter Q12H   thiamine  100 mg Per Tube Daily   Or   thiamine  100 mg Intravenous Daily    Assessment: 70 y/o male admitted for sepsis from contained perforated diverticulitis with active ileus. On PTA Xarelto for Afib with last dose on 9/2 at 2000.  On IV heparin-Xarelto on hold due to possibility that patient may require a laparotomy.         Repeat CT 9/7 with increased size of abscess, s/p IR drain 9/8. Surgery ordered to stop heparin drip MN 9/9 in prep for surgery on 9/10. Heparin was stopped at midnight on 9/9. Heparin levels have been subtherapeutic around 0.2s and the rate was increased before holding for surgery.   Heparin level 0.53. No overt bleeding or complications noted.  Goal of  Therapy:  Heparin level 0.3-0.7 units/ml Monitor platelets by anticoagulation protocol: Yes   Plan:  Continue IV heparin at 1900 units/hr. Daily HL and CBC Monitor for signs and symptoms of bleeding.  Erin Hearing PharmD., BCPS Clinical Pharmacist 02/28/2021 5:37 AM

## 2021-02-28 NOTE — Progress Notes (Signed)
The patient requested for sleeping aid. Notified Dr. Tonie Griffith and received IV Benadryl. Will implement order and continue to monitor.

## 2021-02-28 NOTE — Progress Notes (Signed)
PROGRESS NOTE        PATIENT DETAILS Name: Travis Conrad Age: 70 y.o. Sex: male Date of Birth: Oct 02, 1950 Admit Date: 02/19/2021 Admitting Physician Etta Quill, DO PCP:Sun, [name], MD  Brief Narrative: Patient is a 70 y.o. male A. fib, HTN, HLD, prior diverticulitis, heavy EtOH use-who presented with lower abdominal pain/nausea/vomiting-on initial presentation to the ED found to have severe sepsis physiology with hypotension/AKI-felt to be due to perforated diverticulitis.  See below for further details.  Significant events: 9/3>> presented with lower abdominal pain/vomiting-sepsis from contained perforated diverticulitis.  Found to have very active ileus/PSBO from diverticulitis/bowel inflammation 9/4>> A. fib with RVR-not responding to Lopressor/Cardizem-BP soft-started amiodarone infusion-cardiology consulted. 9/7>> increase in size of pelvic abscess on repeat CT. 9/8>> CT-guided drainage of pelvic abscess 9/9>> right testicular pain/swelling-scrotal ultrasound with Doppler pending.  Significant studies: 9/3>> CT abdomen/pelvis: Acute sigmoid diverticulitis-small extraluminal gas-fluid collection in the sigmoid mesocolon consistent with abscess.  Dilated proximal small bowel-likely ileus. 9/7>> CT abdomen/pelvis: Abscess has increased in size 9/7>> Echo: EF 60-65%.  Antimicrobial therapy: Zosyn: 9/3>>  Microbiology data: 9/3>> COVID/influenza PCR: Negative 9/3>> blood culture: Negative 9/8>> culture pelvic abscess: Pending.  Procedures : 9/8>> CT-guided placement of 12 French drainage catheter into pelvic collection. 9/10>> s/p ex lap, sigmoid colectomy, end colostomy with enterorrhaphy and drainage of intraabdominal abscess - Dr. Kieth Brightly and Dr. Radene Knee 9/10  Consults: General surgery Cardiology Interventional radiology  DVT Prophylaxis : SCDs Start: 02/20/21 0002   Subjective:  Patient in bed, appears comfortable, denies any headache,  no fever, no chest pain or pressure, no shortness of breath , still has post op abdominal pain, not passing flatus, scrotal discomfort improving.     Assessment/Plan:  Severe sepsis due to diverticulitis with contained perforation and small abscess: Sepsis physiology has resolved-although continues to have leukocytosis-remains on Zosyn.  Underwent CT- guided drainage of pelvic abscess on 9/8.  General surgery following-he underwent Ex.Laprotomy, sigmoid colectomy, end colostomy with enterorrhaphy and drainage of intraabdominal abscess - Dr. Kieth Brightly and Dr. Radene Knee  on 02/26/2021, continue TNA, NG tube to intermittent suction, supportive care and monitor.     Ileus: Likely sequelae of diverticulitis-NG tube in place-see above.    AKI: Hemodynamically mediated-improved-creatinine has now normalized.  Hypokalemia/hypophosphatemia: Repleted and has normalized.  Continue to follow periodically.  PAF with RVR: RVR provoked by sepsis/alcohol withdrawal-currently in sinus rhythm-remains on amiodarone infusion added PO Lopressor 02/27/21 .  On IV heparin-Xarelto on hold due to the small possibility that patient may require a laparotomy.  Cardiology following.     Right scrotal pain and swelling due to epididymitis : Has been placed on doxycycline in addition to Zosyn, symptoms improving continue to monitor, denies any exposure or unprotected sex in the last few years, no unusual discharge.  ?Alcohol withdrawal: Awake and alert this morning-last drink was on 9/2, counseled to quit alcohol, no DTs.  HTN: Placed on oral beta-blocker added Norvasc, pain control and monitor.  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   Diet: Diet Order             Diet NPO time specified Except for: Sips with Meds  Diet effective now                    Code Status: Full code  Family Communication: Spouse-Jimmi  [phone] updated at bedside on 9/9  Disposition Plan: Status is: Inpatient  Remains inpatient appropriate because:Inpatient level of care appropriate due to severity of illness  Dispo: The patient is from: Home              Anticipated d/c is to: Home              Patient currently is not medically stable to d/c.   Difficult to place patient No   Barriers to Discharge: Perforated diverticulitis with abscess-on IV antibiotics-developing alcohol withdrawal symptoms/PAF with RVR.  Antimicrobial agents: Anti-infectives (From admission, onward)    Start     Dose/Rate Route Frequency Ordered Stop   02/26/21 0100  doxycycline (VIBRAMYCIN) 100 mg in sodium chloride 0.9 % 250 mL IVPB        100 mg 125 mL/hr over 120 Minutes Intravenous 2 times daily 02/26/21 0005     02/20/21 0300  piperacillin-tazobactam (ZOSYN) IVPB 3.375 g        3.375 g 12.5 mL/hr over 240 Minutes Intravenous Every 8 hours 02/19/21 1907     02/19/21 1900  ceFEPIme (MAXIPIME) 2 g in sodium chloride 0.9 % 100 mL IVPB  Status:  Discontinued        2 g 200 mL/hr over 30 Minutes Intravenous  Once 02/19/21 1857 02/19/21 1906   02/19/21 1900  metroNIDAZOLE (FLAGYL) IVPB 500 mg  Status:  Discontinued        500 mg 100 mL/hr over 60 Minutes Intravenous  Once 02/19/21 1857 02/19/21 1906   02/19/21 1815  piperacillin-tazobactam (ZOSYN) IVPB 3.375 g        3.375 g 100 mL/hr over 30 Minutes Intravenous  Once 02/19/21 1809 02/19/21 1944        Time spent: 35 minutes-Greater than 50% of this time was spent in counseling, explanation of diagnosis, planning of further management, and coordination of care.  MEDICATIONS: Scheduled Meds:  amLODipine  10 mg Oral Daily   Chlorhexidine Gluconate Cloth  6 each Topical Daily   folic acid  1 mg Per Tube Daily   insulin aspart  0-15 Units Subcutaneous Q4H   lidocaine  1 patch Transdermal Q24H   metoprolol tartrate  100 mg Oral BID   nicotine  21 mg Transdermal Daily    pantoprazole (PROTONIX) IV  40 mg Intravenous Q24H   sodium chloride flush  10 mL Intracatheter Q12H   thiamine  100 mg Per Tube Daily   Or   thiamine  100 mg Intravenous Daily   Continuous Infusions:  sodium chloride Stopped (02/27/21 0414)   amiodarone 30 mg/hr (02/28/21 0500)   doxycycline (VIBRAMYCIN) IV 100 mg (02/27/21 2300)   heparin 1,900 Units/hr (02/28/21 0215)   lactated ringers with kcl 10 mL/hr at 02/27/21 0633   methocarbamol (ROBAXIN) IV 500 mg (02/28/21 0500)   piperacillin-tazobactam (ZOSYN)  IV 3.375 g (02/28/21 0216)   TPN ADULT (ION) 60 mL/hr at 02/27/21 1747   TPN ADULT (ION)     PRN Meds:.sodium chloride, hydrALAZINE, menthol-cetylpyridinium, metoprolol tartrate, morphine injection, [DISCONTINUED] ondansetron **OR** ondansetron (ZOFRAN) IV, oxyCODONE   PHYSICAL EXAM: Vital signs: Vitals:   02/27/21 2100 02/28/21 0000 02/28/21 0500 02/28/21 0800  BP: (!) 172/88 (!) 159/71 (!) 157/71 (!) 182/77  Pulse: 82 64 64 66  Resp: '18 14 16 17  '$ Temp: 97.7 F (36.5 C)  98.3 F (36.8 C) 98 F (36.7 C)  TempSrc: Oral  Oral Oral  SpO2: 97% 97% 97% 98%  Weight:      Height:       Filed Weights   02/19/21 1800 02/20/21 2000 02/26/21 0913  Weight: 79.4 kg 79.4 kg 82.6 kg   Body mass index is 24.68 kg/m.   Exam  Awake Alert, No new F.N deficits, NG in place, colostomy in place, scrotal swelling improving [place].AT,PERRAL Supple Neck,No JVD, No cervical lymphadenopathy appriciated.  Symmetrical Chest wall movement, Good air movement bilaterally, CTAB RRR,No Gallops, Rubs or new Murmurs, No Parasternal Heave +ve B.Sounds, Abd Soft,  No Cyanosis, Clubbing or edema, No new Rash or bruise   I have personally reviewed following labs and imaging studies  LABORATORY DATA:  Recent Labs  Lab 02/23/21 0120 02/24/21 0356 02/25/21 0144 02/26/21 0135 02/27/21 0322  WBC 16.1* 14.6* 15.6* 16.6* 19.6*  HGB 13.3 12.8* 13.8 12.9* 12.4*  HCT 39.5 37.7* 39.5 37.3* 36.4*   PLT 254 286 337 334 355  MCV 101.8* 102.7* 101.0* 100.8* 100.6*  MCH 34.3* 34.9* 35.3* 34.9* 34.3*  MCHC 33.7 34.0 34.9 34.6 34.1  RDW 13.2 13.2 13.2 13.2 13.2    Recent Labs  Lab 02/23/21 0120 02/24/21 0356 02/25/21 0144 02/25/21 0957 02/26/21 0135 02/27/21 0322 02/28/21 0440  NA 136 134* 135  --  134* 134* 135  K 3.7 4.6 4.0  --  3.9 4.1 4.0  CL 98 98 98  --  99 101 101  CO2 '30 24 26  '$ --  '28 26 26  '$ GLUCOSE 96 80 91  --  179* 191* 128*  BUN '19 15 11  '$ --  '14 13 13  '$ CREATININE 1.32* 1.01 1.22  --  1.07 0.91 0.97  CALCIUM 8.5* 8.7* 8.3*  --  8.2* 8.1* 8.3*  AST 15  --  12*  --   --   --  17  ALT 13  --  12  --   --   --  10  ALKPHOS 64  --  50  --   --   --  50  BILITOT 0.4  --  1.1  --   --   --  0.4  ALBUMIN 2.2*  --  2.0*  --   --   --  1.7*  MG 2.0 2.0 1.8  --  2.0 1.9 1.9  HGBA1C  --   --   --  5.9*  --   --   --      RADIOLOGY STUDIES/RESULTS: No results found.   LOS: 8 days   Signature  Lala Lund M.D on 02/28/2021 at 10:37 AM   -  To page go to [email or web address]    02/28/2021, 10:37 AM

## 2021-02-28 NOTE — Consult Note (Signed)
WOC Nurse Consult Note: Reason for Consult:NPWT (VAC) dressing change to midline abdominal wound.  CCS PA, Michael at bedside.  LUQ colostomy in place.  Wound type:surgical Pressure Injury POA: NA Measurement: 15 cm x 4 cm x 4.2 cm  Wound [id number] red, bleeds with care.  Drainage (amount, consistency, odor) bleeding.  No odor.  Periwound:LUQ colostomy Dressing procedure/placement/frequency: CLeanse midline wound with NS and pat dry.  Fill wound bed with black foam (used 2 pieces today)  Cover with drape and apply suction.  Change Mon.Wed.FRi.  [place] Nurse ostomy consult note Stoma type/location: LUQ colostomy, ileus, no stool yet.   Stomal assessment/size: 1 3/8" flush Peristomal assessment: intact  midline incision in close proximity. Pouch was leaking when I changed today. I will implement 1 piece convex for added security.   Treatment options for stomal/peristomal skin: barrier ring, convex pouch Output blood only Ostomy pouching: 1pc.convex Education provided: Wife at bedside.  I provide her with written materials, ostomy clinic info and she observes my pouch change today.  She agrees to try pouch change with [place] team on Wednesday around 10 Am.  She is appreciative for the information.  Enrolled patient in [place] program: At next visit.  Will follow.  Domenic Moras MSN, RN, FNP-BC CWON Wound, Ostomy, Continence Nurse Pager [phone]

## 2021-02-28 NOTE — Progress Notes (Signed)
PHARMACY - TOTAL PARENTERAL NUTRITION CONSULT NOTE   Indication: Prolonged ileus  Patient Measurements: Height: 6' (182.9 cm) Weight: 82.6 kg (182 lb) IBW/kg (Calculated) : 77.6 TPN AdjBW (KG): 82.6 Body mass index is 24.68 kg/m.  Assessment: 45 YOM with lower abdominal pain/N/V found to have perforate diverticulitis. CT abd on 9/3 found to have active ileus/PSBO from diverticulitis/bowel inflammation. Pharmacy consulted to start TPN for prolonged ileus. Of note patient has a h/o alcohol dependency and is at high risk for refeeding.   Glucose / Insulin: no hx DM, CBGs now controlled again on TPN (noted, received Decadron '5mg'$  IV x 1 on 9/10) 12 units mSSI utilized in last 24hrs Electrolytes: K stable 4 (goal >/=4 with ileus), Mg stable 1.9 (s/p mag sulfate 2g IV x 1 yesterday; goal >/=2 with ileus), Phos up to 3.6 (s/p KPhos 28mol IV x 1 yesterday), others WNL Renal: SCr 0.97 stable, BUN WNL Hepatic: LFTs / Tbili / TG wnl, Albumin 1.7 Intake / Output; MIVF: NG output 3555m Drain output 8537mUOP 0.5ml12m/hr; MIVF: LR+40K at 10 ml/hr. Net +9.8L this admit GI Imaging: 9/3 CT abd: Active ileus/PSBO GI Surgeries / Procedures: 9/10 ex-lap, sigmoid colectomy w/ end colostomy creation, enterorrhaphy, drainage of intra-abd abscess, wound vac placement  Central access: PICC line 9/8 >>  TPN start date: 9/9>>   Nutritional Goals: Goal TPN rate is 85 mL/hr (provides 102 g of protein and 2105 kcals per day)  RD Assessment: Estimated Needs Total Energy Estimated Needs: 2000-2200 Total Protein Estimated Needs: 100-110 grams Total Fluid Estimated Needs: >2L  Current Nutrition:  TPN; NPO  Plan:  Increase TPN to goal rate 85mL37mat 1800 Electrolytes in TPN: Na 100mEq61mK 50mEq/20ma 5mEq/L,71mcrease Mg to 9mEq/L, 36m decrease Phos to 20mmol/L.67mAc 1:1 Add standard MVI and trace elements to TPN Continue Moderate q4h SSI and adjust as needed Continue MIVF per MD (LR+40K) at 10  mL/hr Monitor TPN labs, F/u Surgery plans post-op   Justyna Timoney von Arturo MortonBCPS Please check AMION for all [facility] PharmacGilman Cityumbers Clinical Pharmacist 02/28/2021 8:18 AM

## 2021-02-28 NOTE — Progress Notes (Signed)
2 Days Post-Op  Subjective: CC: Patient having lower abdominal pain that is worse on the left and stable from yesterday. No nausea. NGT w/ 750cc/24 hours. No air or stool in colostomy. Mobilized with therapies yesterday who are rec HH. Foley out and voiding.   Objective: Vital signs in last 24 hours: Temp:  [97.7 F (36.5 C)-98.3 F (36.8 C)] 98 F (36.7 C) (09/12 0800) Pulse Rate:  [51-92] 66 (09/12 0800) Resp:  [14-24] 17 (09/12 0800) BP: (141-195)/(65-100) 182/77 (09/12 0800) SpO2:  [95 %-98 %] 98 % (09/12 0800) Last BM Date: 02/22/21  Intake/Output from previous day: 09/11 0701 - 09/12 0700 In: 1277.3 [I.V.:1127.3; IV Piggyback:150] Out: [id number] [Urine:900; Emesis/NG output:750; Drains:95] Intake/Output this shift: No intake/output data recorded.  PE: Gen:  Alert, NAD, pleasant Card:  Reg Pulm:  Normal rate and effort  Abd: Soft, mild distension, LLQ > LUQ tenderness without peritonitis. Hypoactive bowel sounds. Midline wound with vac in place w/ SS output in cannister. Drain w/ SS output. Colostomy w/ some sweat. No air or stool. Stoma appears pink and viable. NGT w/ bilious output.  Psych: A&[name]  Skin: no rashes noted, warm and dry  Lab Results:  Recent Labs    02/26/21 0135 02/27/21 0322  WBC 16.6* 19.6*  HGB 12.9* 12.4*  HCT 37.3* 36.4*  PLT 334 355   BMET Recent Labs    02/27/21 0322 02/28/21 0440  NA 134* 135  K 4.1 4.0  CL 101 101  CO2 26 26  GLUCOSE 191* 128*  BUN 13 13  CREATININE 0.91 0.97  CALCIUM 8.1* 8.3*   PT/INR No results for input(s): LABPROT, INR in the last 72 hours. CMP     Component Value Date/Time   NA 135 02/28/2021 0440   NA 134 09/30/2019 0959   NA 142 03/03/2016 0921   K 4.0 02/28/2021 0440   K 4.3 03/03/2016 0921   CL 101 02/28/2021 0440   CO2 26 02/28/2021 0440   CO2 25 03/03/2016 0921   GLUCOSE 128 (H) 02/28/2021 0440   GLUCOSE 89 03/03/2016 0921   BUN 13 02/28/2021 0440   BUN 11 09/30/2019 0959   BUN 13.6  03/03/2016 0921   CREATININE 0.97 02/28/2021 0440   CREATININE 1.2 03/03/2016 0921   CALCIUM 8.3 (L) 02/28/2021 0440   CALCIUM 9.6 03/03/2016 0921   PROT 5.0 (L) 02/28/2021 0440   PROT 7.3 03/03/2016 0921   ALBUMIN 1.7 (L) 02/28/2021 0440   ALBUMIN 3.8 03/03/2016 0921   AST 17 02/28/2021 0440   AST 36 (H) 03/03/2016 0921   ALT 10 02/28/2021 0440   ALT 31 03/03/2016 0921   ALKPHOS 50 02/28/2021 0440   ALKPHOS 116 03/03/2016 0921   BILITOT 0.4 02/28/2021 0440   BILITOT 0.71 03/03/2016 0921   GFRNONAA >60 02/28/2021 0440   GFRNONAA 67 07/25/2013 1712   GFRAA >60 10/03/2019 0930   GFRAA 77 07/25/2013 1712   Lipase     Component Value Date/Time   LIPASE 22 02/19/2021 1653    Studies/Results: No results found.  Anti-infectives: Anti-infectives (From admission, onward)    Start     Dose/Rate Route Frequency Ordered Stop   02/26/21 0100  doxycycline (VIBRAMYCIN) 100 mg in sodium chloride 0.9 % 250 mL IVPB        100 mg 125 mL/hr over 120 Minutes Intravenous 2 times daily 02/26/21 0005     02/20/21 0300  piperacillin-tazobactam (ZOSYN) IVPB 3.375 g  3.375 g 12.5 mL/hr over 240 Minutes Intravenous Every 8 hours 02/19/21 1907     02/19/21 1900  ceFEPIme (MAXIPIME) 2 g in sodium chloride 0.9 % 100 mL IVPB  Status:  Discontinued        2 g 200 mL/hr over 30 Minutes Intravenous  Once 02/19/21 1857 02/19/21 1906   02/19/21 1900  metroNIDAZOLE (FLAGYL) IVPB 500 mg  Status:  Discontinued        500 mg 100 mL/hr over 60 Minutes Intravenous  Once 02/19/21 1857 02/19/21 1906   02/19/21 1815  piperacillin-tazobactam (ZOSYN) IVPB 3.375 g        3.375 g 100 mL/hr over 30 Minutes Intravenous  Once 02/19/21 1809 02/19/21 1944        Assessment/Plan POD 2 s/p ex lap, sigmoid colectomy, end colostomy with enterorrhaphy and drainage of intraabdominal abscess - Dr. Kieth Brightly and Dr. Radene Knee 9/10 for Acute sigmoid diverticulitis with contained perforation and abscess - cont abx  therapy - cont drain - Wound vac MWF. I have reached out to WOCN to see dressing change today - WOC consulted for new ostomy - Await bowel function and keep NGT to LIWS today - Cont TPN - AM labs. WBC up yesterday.  - encouraged ambulation, use IS, multimodal pain control  - PT rec HH   FEN - NGT LIWS, NPO sips/chips, IVF, TPN VTE - heparin gtt ID - zosyn 9/4>>, doxycycline (orchitis) Foley - out, ext   A fib AKI Hypokalemia HTN ETOH withdrawal   LOS: 8 days    Jillyn Ledger , [facility] 02/28/2021, 8:34 AM Please see Amion for pager number during day hours 7:00am-4:30pm

## 2021-03-01 DIAGNOSIS — I482 Chronic atrial fibrillation, unspecified: Secondary | ICD-10-CM | POA: Diagnosis not present

## 2021-03-01 DIAGNOSIS — K5720 Diverticulitis of large intestine with perforation and abscess without bleeding: Secondary | ICD-10-CM | POA: Diagnosis not present

## 2021-03-01 LAB — BASIC METABOLIC PANEL
Anion gap: 8 (ref 5–15)
BUN: 15 mg/dL (ref 8–23)
CO2: 24 mmol/L (ref 22–32)
Calcium: 8 mg/dL — ABNORMAL LOW (ref 8.9–10.3)
Chloride: 102 mmol/L (ref 98–111)
Creatinine, Ser: 0.82 mg/dL (ref 0.61–1.24)
GFR, Estimated: >60 mL/min (ref >60)
Glucose, Bld: 147 mg/dL — ABNORMAL HIGH (ref 70–99)
Potassium: 4.2 mmol/L (ref 3.5–5.1)
Sodium: 134 mmol/L — ABNORMAL LOW (ref 135–145)

## 2021-03-01 LAB — CBC
HCT: 33.3 % — ABNORMAL LOW (ref 39.0–52.0)
Hemoglobin: 11.3 g/dL — ABNORMAL LOW (ref 13.0–17.0)
MCH: 34.6 pg — ABNORMAL HIGH (ref 26.0–34.0)
MCHC: 33.9 g/dL (ref 30.0–36.0)
MCV: 101.8 fL — ABNORMAL HIGH (ref 80.0–100.0)
Platelets: 390 10*3/uL (ref 150–400)
RBC: 3.27 MIL/uL — ABNORMAL LOW (ref 4.22–5.81)
RDW: 14.2 % (ref 11.5–15.5)
WBC: 13.9 10*3/uL — ABNORMAL HIGH (ref 4.0–10.5)
nRBC: 0 % (ref 0.0–0.2)

## 2021-03-01 LAB — CBC WITH DIFFERENTIAL/PLATELET
Abs Immature Granulocytes: 0.43 10*3/uL — ABNORMAL HIGH (ref 0.00–0.07)
Basophils Absolute: 0.1 10*3/uL (ref 0.0–0.1)
Basophils Relative: 0 %
Eosinophils Absolute: 0 10*3/uL (ref 0.0–0.5)
Eosinophils Relative: 0 %
HCT: 33.9 % — ABNORMAL LOW (ref 39.0–52.0)
Hemoglobin: 11.2 g/dL — ABNORMAL LOW (ref 13.0–17.0)
Immature Granulocytes: 3 %
Lymphocytes Relative: 17 %
Lymphs Abs: 2.5 10*3/uL (ref 0.7–4.0)
MCH: 34.1 pg — ABNORMAL HIGH (ref 26.0–34.0)
MCHC: 33 g/dL (ref 30.0–36.0)
MCV: 103.4 fL — ABNORMAL HIGH (ref 80.0–100.0)
Monocytes Absolute: 1.3 10*3/uL — ABNORMAL HIGH (ref 0.1–1.0)
Monocytes Relative: 9 %
Neutro Abs: 10.4 10*3/uL — ABNORMAL HIGH (ref 1.7–7.7)
Neutrophils Relative %: 71 %
Platelets: 432 10*3/uL — ABNORMAL HIGH (ref 150–400)
RBC: 3.28 MIL/uL — ABNORMAL LOW (ref 4.22–5.81)
RDW: 14.1 % (ref 11.5–15.5)
WBC: 14.7 10*3/uL — ABNORMAL HIGH (ref 4.0–10.5)
nRBC: 0 % (ref 0.0–0.2)

## 2021-03-01 LAB — MAGNESIUM: Magnesium: 2 mg/dL (ref 1.7–2.4)

## 2021-03-01 LAB — SURGICAL PATHOLOGY

## 2021-03-01 LAB — GLUCOSE, CAPILLARY
Glucose-Capillary: 141 mg/dL — ABNORMAL HIGH (ref 70–99)
Glucose-Capillary: 149 mg/dL — ABNORMAL HIGH (ref 70–99)
Glucose-Capillary: 150 mg/dL — ABNORMAL HIGH (ref 70–99)
Glucose-Capillary: 151 mg/dL — ABNORMAL HIGH (ref 70–99)
Glucose-Capillary: 154 mg/dL — ABNORMAL HIGH (ref 70–99)
Glucose-Capillary: 158 mg/dL — ABNORMAL HIGH (ref 70–99)
Glucose-Capillary: 175 mg/dL — ABNORMAL HIGH (ref 70–99)

## 2021-03-01 LAB — HEPARIN LEVEL (UNFRACTIONATED)
Heparin Unfractionated: 0.89 IU/mL — ABNORMAL HIGH (ref 0.30–0.70)
Heparin Unfractionated: 0.82 IU/mL — ABNORMAL HIGH (ref 0.30–0.70)

## 2021-03-01 LAB — PHOSPHORUS: Phosphorus: 3.4 mg/dL (ref 2.5–4.6)

## 2021-03-01 MED ORDER — MELATONIN 3 MG PO TABS
3.0000 mg | ORAL_TABLET | Freq: Every day | ORAL | Status: DC
  Administered 2021-03-01 – 2021-03-15 (×15): 3 mg
  Filled 2021-03-01 (×15): qty 1

## 2021-03-01 MED ORDER — HEPARIN (PORCINE) 25000 UT/250ML-% IV SOLN
1900.0000 [IU]/h | INTRAVENOUS | Status: DC
  Administered 2021-03-01 – 2021-03-04 (×5): 1600 [IU]/h via INTRAVENOUS
  Administered 2021-03-05: 1800 [IU]/h via INTRAVENOUS
  Administered 2021-03-05: 1850 [IU]/h via INTRAVENOUS
  Filled 2021-03-01 (×8): qty 250

## 2021-03-01 MED ORDER — MELATONIN 3 MG PO TABS: 3.0000 mg | ORAL_TABLET | Freq: Every day | ORAL | Status: DC

## 2021-03-01 MED ORDER — CLONIDINE HCL 0.1 MG PO TABS
0.1000 mg | ORAL_TABLET | Freq: Two times a day (BID) | ORAL | Status: DC
  Administered 2021-03-01 (×2): 0.1 mg via ORAL
  Filled 2021-03-01 (×3): qty 1

## 2021-03-01 MED ORDER — OXYCODONE HCL 5 MG/5ML PO SOLN
5.0000 mg | ORAL | Status: DC | PRN
  Administered 2021-03-02 – 2021-03-07 (×11): 10 mg
  Administered 2021-03-08: 5 mg
  Administered 2021-03-08 – 2021-03-10 (×3): 10 mg
  Administered 2021-03-10: 5 mg
  Administered 2021-03-11 – 2021-03-15 (×14): 10 mg
  Filled 2021-03-01 (×2): qty 10
  Filled 2021-03-01: qty 5
  Filled 2021-03-01: qty 10
  Filled 2021-03-01: qty 5
  Filled 2021-03-01 (×2): qty 10
  Filled 2021-03-01: qty 5
  Filled 2021-03-01 (×23): qty 10

## 2021-03-01 MED ORDER — ZOLPIDEM TARTRATE 5 MG PO TABS
5.0000 mg | ORAL_TABLET | Freq: Every evening | ORAL | Status: DC | PRN
Start: 2021-03-01 — End: 2021-03-01

## 2021-03-01 MED ORDER — TRAVASOL 10 % IV SOLN
INTRAVENOUS | Status: AC
  Filled 2021-03-01: qty 1020

## 2021-03-01 MED ORDER — ZOLPIDEM TARTRATE 5 MG PO TABS
5.0000 mg | ORAL_TABLET | Freq: Every evening | ORAL | Status: DC | PRN
  Administered 2021-03-03 – 2021-03-21 (×15): 5 mg
  Filled 2021-03-01 (×16): qty 1

## 2021-03-01 MED ORDER — METHOCARBAMOL 1000 MG/10ML IJ SOLN
1000.0000 mg | Freq: Four times a day (QID) | INTRAVENOUS | Status: DC
  Administered 2021-03-01 – 2021-03-12 (×43): 1000 mg via INTRAVENOUS
  Filled 2021-03-01 (×4): qty 10
  Filled 2021-03-01: qty 1000
  Filled 2021-03-01: qty 10
  Filled 2021-03-01: qty 1000
  Filled 2021-03-01: qty 10
  Filled 2021-03-01 (×2): qty 1000
  Filled 2021-03-01 (×6): qty 10
  Filled 2021-03-01: qty 1000
  Filled 2021-03-01 (×2): qty 10
  Filled 2021-03-01: qty 1000
  Filled 2021-03-01: qty 10
  Filled 2021-03-01: qty 1000
  Filled 2021-03-01: qty 10
  Filled 2021-03-01: qty 1000
  Filled 2021-03-01 (×3): qty 10
  Filled 2021-03-01: qty 1000
  Filled 2021-03-01 (×4): qty 10
  Filled 2021-03-01: qty 1000
  Filled 2021-03-01 (×3): qty 10
  Filled 2021-03-01: qty 1000
  Filled 2021-03-01 (×7): qty 10
  Filled 2021-03-01 (×2): qty 1000
  Filled 2021-03-01: qty 10

## 2021-03-01 NOTE — Progress Notes (Signed)
ANTICOAGULATION + ANTIBIOTIC CONSULT NOTE - Follow Up Consult  Pharmacy Consult for Heparin;  Zosyn Indication: atrial fibrillation;  intra-abdominal infection  Allergies  Allergen Reactions   Atorvastatin Other (See Comments)    myalgias    Patient Measurements: Height: 6' (182.9 cm) Weight: 82.6 kg (182 lb) IBW/kg (Calculated) : 77.6 Heparin Dosing Weight: 82.6 kg  Vital Signs: Temp: 98.3 F (36.8 C) (09/13 0800) Temp Source: Oral (09/13 0800) BP: 181/69 (09/13 0800) Pulse Rate: 80 (09/13 0800)  Labs: Recent Labs    02/27/21 0322 02/27/21 1432 02/27/21 2012 02/28/21 0440 03/01/21 0441  HGB 12.4*  --   --   --  11.3*  HCT 36.4*  --   --   --  33.3*  PLT 355  --   --   --  390  HEPARINUNFRC <0.10*   < > 0.22* 0.53 0.89*  CREATININE 0.91  --   --  0.97 0.82   < > = values in this interval not displayed.    Estimated Creatinine Clearance: 92 mL/min (by C-G formula based on SCr of 0.82 mg/dL).  Assessment: 70 y/o male admitted for sepsis from contained perforated diverticulitis with active ileus. On PTA Xarelto for Afib with last dose on 9/2 at 2000.  On IV heparin; Xarelto held for possible need for surgery.       Repeat CT 9/7 with increased size of abscess, s/p IR drain 9/8. Heparin was stopped at midnight on 9/9 before surgery 9/10 and resumed  9/11 ~24 hours post-op. Initial heparin levels low after resuming, then therapeutic (0.53) yesterday on 1900 units/hr. Heparin level up to 0.89 this am on same rate. Supratherapeutic.  Day #10 Zosyn for diverticulitis with contained perforation and small abscess. Drain placed 9/8, s/p ex lap, sigmoid colectomy, end colosotomy with enterorrhaphy and drainage of intra-abdominal abscess on 9/10.  Zosyn dose remains appropriate.  Also on Day #4 Doxycycline IV for orchitis.   Blood cultures negative.  Abscess culture grew 2 Bacteroides species.  Goal of Therapy:  Heparin level 0.3-0.7 units/ml Monitor platelets by anticoagulation  protocol: Yes Appropriate Zosyn dose for indication and renal function   Plan:  Decrease heparin drip to 1750 units/hr. Heparin level ~8 hrs after rate change. Daily heparin level and CBC. Xarelto on hold. Continue Zosyn 3.375 gm IV q8h (each over 4 hours). Also on Doxycycline 100 mg IV q12h. Monitor renal function, clinical progress and antibiotic plans.  Arty Baumgartner, [place] 03/01/2021,8:36 AM

## 2021-03-01 NOTE — Progress Notes (Signed)
This nurse noticed bright red blood in colostomy and notified Dr. Candiss Norse, was given verbal order to stop Heparin until notified differently. This nurse also paged Surgery and was asked to have blood redrawn to check hemoglobin.

## 2021-03-01 NOTE — Progress Notes (Addendum)
ANTICOAGULATION CONSULT NOTE - Follow Up Consult  Pharmacy Consult for IV Heparin Indication: atrial fibrillation  Allergies  Allergen Reactions   Atorvastatin Other (See Comments)    myalgias    Patient Measurements: Height: 6' (182.9 cm) Weight: 82.6 kg (182 lb) IBW/kg (Calculated) : 77.6 Heparin Dosing Weight: 82.6 kg  Vital Signs: Temp: 98.4 F (36.9 C) (09/13 1600) Temp Source: Oral (09/13 1600) BP: 156/65 (09/13 1600) Pulse Rate: 87 (09/13 1600)  Labs: Recent Labs    02/27/21 0322 02/27/21 1432 02/28/21 0440 03/01/21 0441 03/01/21 1704  HGB 12.4*  --   --  11.3*  --   HCT 36.4*  --   --  33.3*  --   PLT 355  --   --  390  --   HEPARINUNFRC <0.10*   < > 0.53 0.89* 0.82*  CREATININE 0.91  --  0.97 0.82  --    < > = values in this interval not displayed.    Estimated Creatinine Clearance: 92 mL/min (by C-G formula based on SCr of 0.82 mg/dL).  Assessment: 70 yr old man admitted for sepsis from contained perforated diverticulitis with active ileus. Pharmacy was consulted for IV heparin for atrial fibrillation; pt was on  Xarelto PTA, with last dose on 9/2 at 2000 (Xarelto on hold for possible surgery).         Repeat CT 9/7 showed increased size of abscess; pt is S/P IR drain 9/8. Heparin was stopped at midnight on 9/9 before surgery 9/10 and resumed  9/11 ~24 hours post-op. Initial heparin levels low after resuming, then therapeutic (0.53 units/ml) yesterday on 1900 units/hr. Heparin level up to 0.89 units/ml this am on same rate (above goal range).   Heparin level ~8 hrs after heparin infusion was decreased to 1750 units/hr was 0.82 units/ml, which remains above the goal range for this pt. H/H 11.3/33.3 (trending down), plt 390. Per RN, no issues with IV. Per Audie Pinto, RN, pt had blood in colostomy this evening; Dr Candiss Norse was notified and he gave RN order to stop heparin infusion until further notice and ck Hgb.   Goal of Therapy:  Heparin level 0.3-0.7  units/ml (aiming for low end of range, 0.3-0.5 units/ml, due to blood in colostomy output) Monitor platelets by anticoagulation protocol: Yes   Plan:  F/U Hgb Hold heparin infusion until directed to resume by Dr Candiss Norse or covering provider If able to resume heparin infusion, resume at lower rate of 1600 units/hr Check heparin level ~7-8 hrs after resuming heparin infusion Monitor daily heparin level, CBC Monitor for bleeding  Gillermina Hu, PharmD, BCPS, [facility] Clinical Pharmacist 03/01/2021,7:06 PM   ADDENDUM  Hgb this evening was 11.2 (last value, with AM labs today, was 11.3); night RN said hard to tell if blood or stool in colostomy output. Heparin infusion has been off since ~1840 this evening. Dr. Tonie Griffith okay'd restarting heparin at lower rate as listed above (1600 units/hr), aiming for lower heparin level goal of 0.3-0.5 units/ml, with recheck of  heparin level and CBC in ~7-8 hrs.

## 2021-03-01 NOTE — Progress Notes (Signed)
Physical Therapy Treatment Patient Details Name: Travis Conrad MRN: [id number] DOB: [birth year] Today's Date: 03/01/2021   History of Present Illness 70 yo admitted 9/3 with abdominal pain, vomiting and ileus. 9/4 Afib with RVR. 9/8 intrabdominal abscess s/p IR drain placed. 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 PMhx: HTN, HLD, Afib, ETOH use    PT Comments    Pt moving well with slightly increased gait distance but less assist for bed mobility. Pt educated for splinting with pillow with coughing and transfers and educated for HEP. Wife present throughout session and encouraged continued mobility OOB with nursing throughout the day. Will continue to follow with HHPt still appropriate.   BP initial 154/68 BP 172/91, 96% RA end of session    Recommendations for follow up therapy are one component of a multi-disciplinary discharge planning process, led by the attending physician.  Recommendations may be updated based on patient status, additional functional criteria and insurance authorization.  Follow Up Recommendations  Home health PT     Equipment Recommendations  3in1 (PT)    Recommendations for Other Services       Precautions / Restrictions Precautions Precautions: Fall;Other (comment) Precaution Comments: wound vAC, jp drain, NGtube     Mobility  Bed Mobility Overal bed mobility: Needs Assistance Bed Mobility: Rolling;Sidelying to Sit Rolling: Min assist Sidelying to sit: Min assist       General bed mobility comments: cues for sequence with assist to roll and rise from surface with multimodal cues    Transfers Overall transfer level: Needs assistance     Sit to Stand: Min assist         General transfer comment: cues for hand placement, sequence and safety with increased time to stabilize in standing with RW  Ambulation/Gait Ambulation/Gait assistance: Min guard Gait Distance (Feet): 90 Feet Assistive device: Rolling walker (2  wheeled) Gait Pattern/deviations: Trunk flexed;Decreased stride length;Step-through pattern   Gait velocity interpretation: 1.31 - 2.62 ft/sec, indicative of limited community ambulator General Gait Details: pt with maintained hip and trunk flexion due to pain with cues for posture and position in RW. Pt able to self regulate activity tolerance   Stairs             Wheelchair Mobility    Modified Rankin (Stroke Patients Only)       Balance Overall balance assessment: Needs assistance   Sitting balance-Leahy Scale: Good Sitting balance - Comments: able to sit without UE support   Standing balance support: Bilateral upper extremity supported Standing balance-Leahy Scale: Poor Standing balance comment: RW use for gait                            Cognition Arousal/Alertness: Awake/alert Behavior During Therapy: WFL for tasks assessed/performed Overall Cognitive Status: Impaired/Different from baseline Area of Impairment: Memory                     Memory: Decreased short-term memory (pt reports STM is baseline)         General Comments: HOH      Exercises General Exercises - Lower Extremity Long Arc Quad: AROM;Both;Seated;20 reps Hip Flexion/Marching: AROM;Both;Seated;20 reps    General Comments        Pertinent Vitals/Pain Pain Score: 9  Pain Location: abdominal incision (pt reports he has been giving pain a 7 but is "cranky this morning so a 9") Pain Descriptors / Indicators: Aching;Guarding Pain Intervention(s): Limited activity within patient's  tolerance;Monitored during session;Premedicated before session;Repositioned    Home Living                      Prior Function            PT Goals (current goals can now be found in the care plan section) Progress towards PT goals: Progressing toward goals    Frequency    Min 3X/week      PT Plan Current plan remains appropriate    Co-evaluation               AM-PAC PT "6 Clicks" Mobility   Outcome Measure  Help needed turning from your back to your side while in a flat bed without using bedrails?: A Little Help needed moving from lying on your back to sitting on the side of a flat bed without using bedrails?: A Lot Help needed moving to and from a bed to a chair (including a wheelchair)?: A Little Help needed standing up from a chair using your arms (e.g., wheelchair or bedside chair)?: A Little Help needed to walk in hospital room?: A Little Help needed climbing 3-5 steps with a railing? : A Lot 6 Click Score: 16    End of Session   Activity Tolerance: Patient tolerated treatment well;Patient limited by pain Patient left: in chair;with call bell/phone within reach;with chair alarm set;with family/visitor present Nurse Communication: Mobility status PT Visit Diagnosis: Other abnormalities of gait and mobility (R26.89);Difficulty in walking, not elsewhere classified (R26.2)     Time: [id number] PT Time Calculation (min) (ACUTE ONLY): 41 min  Charges:  $Gait Training: 8-22 mins $Therapeutic Exercise: 8-22 mins $Therapeutic Activity: 8-22 mins                     Zakariah Dejarnette P, PT Acute Rehabilitation Services Pager: [phone] Office: [place] 03/01/2021, 9:49 AM

## 2021-03-01 NOTE — Progress Notes (Signed)
3 Days Post-Op  Subjective: CC: Stable from yesterday. Still having lower abdominal pain that is worse on the left. No nausea. NGT 900cc/24 hours. No air or stool in colostomy. Voiding. Working with therapies.   Objective: Vital signs in last 24 hours: Temp:  [97.6 F (36.4 C)-99 F (37.2 C)] 98.3 F (36.8 C) (09/13 0800) Pulse Rate:  [67-81] 80 (09/13 0800) Resp:  [14-20] 19 (09/13 0800) BP: (146-195)/(61-78) 181/69 (09/13 0800) SpO2:  [96 %-99 %] 96 % (09/13 0800) Last BM Date: 02/22/21  Intake/Output from previous day: 09/12 0701 - 09/13 0700 In: 3820.2 [I.V.:2519.3; NG/GT:300; IV Piggyback:1000.9] Out: 2520 [Urine:1450; Emesis/NG output:900; Drains:170] Intake/Output this shift: No intake/output data recorded.  PE: Gen:  Alert, NAD, pleasant Card:  Reg Pulm:  Normal rate and effort  Abd: Soft, mild distension, LLQ > LUQ tenderness without peritonitis. Hypoactive bowel sounds. Midline wound with vac in place w/ SS output in cannister. Drain w/ SS output. Colostomy w/ some sweat. No air or stool. Stoma appears pink and viable. NGT w/ bilious output.  Psych: A&[name]  Skin: no rashes noted, warm and dry   From vac change yesterday    Lab Results:  Recent Labs    02/27/21 0322 03/01/21 0441  WBC 19.6* 13.9*  HGB 12.4* 11.3*  HCT 36.4* 33.3*  PLT 355 390   BMET Recent Labs    02/28/21 0440 03/01/21 0441  NA 135 134*  K 4.0 4.2  CL 101 102  CO2 26 24  GLUCOSE 128* 147*  BUN 13 15  CREATININE 0.97 0.82  CALCIUM 8.3* 8.0*   PT/INR No results for input(s): LABPROT, INR in the last 72 hours. CMP     Component Value Date/Time   NA 134 (L) 03/01/2021 0441   NA 134 09/30/2019 0959   NA 142 03/03/2016 0921   K 4.2 03/01/2021 0441   K 4.3 03/03/2016 0921   CL 102 03/01/2021 0441   CO2 24 03/01/2021 0441   CO2 25 03/03/2016 0921   GLUCOSE 147 (H) 03/01/2021 0441   GLUCOSE 89 03/03/2016 0921   BUN 15 03/01/2021 0441   BUN 11 09/30/2019 0959   BUN  13.6 03/03/2016 0921   CREATININE 0.82 03/01/2021 0441   CREATININE 1.2 03/03/2016 0921   CALCIUM 8.0 (L) 03/01/2021 0441   CALCIUM 9.6 03/03/2016 0921   PROT 5.0 (L) 02/28/2021 0440   PROT 7.3 03/03/2016 0921   ALBUMIN 1.7 (L) 02/28/2021 0440   ALBUMIN 3.8 03/03/2016 0921   AST 17 02/28/2021 0440   AST 36 (H) 03/03/2016 0921   ALT 10 02/28/2021 0440   ALT 31 03/03/2016 0921   ALKPHOS 50 02/28/2021 0440   ALKPHOS 116 03/03/2016 0921   BILITOT 0.4 02/28/2021 0440   BILITOT 0.71 03/03/2016 0921   GFRNONAA >60 03/01/2021 0441   GFRNONAA 67 07/25/2013 1712   GFRAA >60 10/03/2019 0930   GFRAA 77 07/25/2013 1712   Lipase     Component Value Date/Time   LIPASE 22 02/19/2021 1653    Studies/Results: No results found.  Anti-infectives: Anti-infectives (From admission, onward)    Start     Dose/Rate Route Frequency Ordered Stop   02/26/21 0100  doxycycline (VIBRAMYCIN) 100 mg in sodium chloride 0.9 % 250 mL IVPB        100 mg 125 mL/hr over 120 Minutes Intravenous 2 times daily 02/26/21 0005     02/20/21 0300  piperacillin-tazobactam (ZOSYN) IVPB 3.375 g  3.375 g 12.5 mL/hr over 240 Minutes Intravenous Every 8 hours 02/19/21 1907     02/19/21 1900  ceFEPIme (MAXIPIME) 2 g in sodium chloride 0.9 % 100 mL IVPB  Status:  Discontinued        2 g 200 mL/hr over 30 Minutes Intravenous  Once 02/19/21 1857 02/19/21 1906   02/19/21 1900  metroNIDAZOLE (FLAGYL) IVPB 500 mg  Status:  Discontinued        500 mg 100 mL/hr over 60 Minutes Intravenous  Once 02/19/21 1857 02/19/21 1906   02/19/21 1815  piperacillin-tazobactam (ZOSYN) IVPB 3.375 g        3.375 g 100 mL/hr over 30 Minutes Intravenous  Once 02/19/21 1809 02/19/21 1944        Assessment/Plan POD 3 s/p ex lap, sigmoid colectomy, end colostomy with enterorrhaphy and drainage of intraabdominal abscess - Dr. Kieth Brightly and Dr. Radene Knee 9/10 for Acute sigmoid diverticulitis with contained perforation and abscess - cont abx  therapy - cont drain - Wound vac MWF.  - WOC consulted for new ostomy - Await bowel function and keep NGT to LIWS today - Cont TPN - encouraged ambulation, use IS, multimodal pain control  - PT rec HH   FEN - NGT LIWS, NPO sips/chips, IVF, TPN VTE - heparin gtt ID - zosyn 9/4>>, doxycycline (orchitis). WBC down at 13.9 from 19.6. Afebrile.  Foley - out, ext   A fib AKI - resolved Hypokalemia HTN ETOH withdrawal   LOS: 9 days    Jillyn Ledger , [facility] 03/01/2021, 8:54 AM Please see Amion for pager number during day hours 7:00am-4:30pm

## 2021-03-01 NOTE — Progress Notes (Addendum)
PHARMACY - TOTAL PARENTERAL NUTRITION CONSULT NOTE   Indication: Prolonged ileus  Patient Measurements: Height: 6' (182.9 cm) Weight: 82.6 kg (182 lb) IBW/kg (Calculated) : 77.6 TPN AdjBW (KG): 82.6 Body mass index is 24.68 kg/m.  Assessment: 89 YOM with lower abdominal pain/N/V found to have perforated diverticulitis. CT abd on 9/3 found to have active ileus/pSBO from diverticulitis/bowel inflammation. Pharmacy consulted to start TPN for prolonged ileus. Of note patient has a h/o alcohol dependency and is at high risk for refeeding.   Glucose / Insulin: no hx DM, CBGs <180. 12=1 units mSSI utilized in last 24hrs Electrolytes: K stable 4.2 (goal >/=4 with ileus), Mg 2 (goal >/=2 with ileus), Phos 3.4 Renal: SCr 0.82 stable, BUN WNL Hepatic: LFTs / Tbili / TG wnl, Albumin 1.7 Intake / Output; MIVF: NG 989m up, Drain output 17574m UOP 1450 0.74m51mg/hr; MIVF, No stool noted.  MIVF d/c'd  GI Imaging: 9/3 CT abd: Active ileus/PSBO  GI Surgeries / Procedures: 9/10 ex-lap, sigmoid colectomy w/ end colostomy creation, enterorrhaphy, drainage of intra-abd abscess, wound vac placement  Central access: PICC line 9/8 >>  TPN start date: 9/9>>   Nutritional Goals: Goal TPN rate is 85 mL/hr (provides 102 g of protein and 2104 kcals per day)  RD Assessment: Estimated Needs Total Energy Estimated Needs: 2000-2200 Total Protein Estimated Needs: 100-110 grams Total Fluid Estimated Needs: >2L  Current Nutrition:  TPN; NPO  Plan:  No change to TPN today. Con't TPN to goal rate 16m106m at 1800 Electrolytes in TPN: Na 100mE22m K 50mEq26mCa 5mEq/L29mMg 9mEq/L,64md Phos to 20mmol/L674m:Ac 1:1 Add standard MVI and trace elements to TPN Continue Moderate q4h SSI and adjust as needed Monitor TPN labs Mon/Thurs and prn-- will not order any for tomorrow.   Anna Beaird S. RobertsonAlford Highland BCPS Clinical Staff Pharmacist [email or web address] 03/01/2021 7:18 AM

## 2021-03-01 NOTE — Progress Notes (Addendum)
PROGRESS NOTE        PATIENT DETAILS Name: Travis Conrad Age: 70 y.o. Sex: male Date of Birth: 01-May-1951 Admit Date: 02/19/2021 Admitting Physician Travis Quill, DO PCP:Sun, [name], MD  Brief Narrative: Patient is a 70 y.o. male A. fib, HTN, HLD, prior diverticulitis, heavy EtOH use-who presented with lower abdominal pain/nausea/vomiting-on initial presentation to the ED found to have severe sepsis physiology with hypotension/AKI-felt to be due to perforated diverticulitis.  See below for further details.  Significant events: 9/3>> presented with lower abdominal pain/vomiting-sepsis from contained perforated diverticulitis.  Found to have very active ileus/PSBO from diverticulitis/bowel inflammation 9/4>> A. fib with RVR-not responding to Lopressor/Cardizem-BP soft-started amiodarone infusion-cardiology consulted. 9/7>> increase in size of pelvic abscess on repeat CT. 9/8>> CT-guided drainage of pelvic abscess 9/9>> right testicular pain/swelling-scrotal ultrasound with Doppler pending.  Significant studies: 9/3>> CT abdomen/pelvis: Acute sigmoid diverticulitis-small extraluminal gas-fluid collection in the sigmoid mesocolon consistent with abscess.  Dilated proximal small bowel-likely ileus. 9/7>> CT abdomen/pelvis: Abscess has increased in size 9/7>> Echo: EF 60-65%.  Antimicrobial therapy: Zosyn: 9/3>>  Microbiology data: 9/3>> COVID/influenza PCR: Negative 9/3>> blood culture: Negative 9/8>> culture pelvic abscess: Pending.  Procedures : 9/8>> CT-guided placement of 12 French drainage catheter into pelvic collection. 9/10>> s/p ex lap, sigmoid colectomy, end colostomy with enterorrhaphy and drainage of intraabdominal abscess - Travis Conrad and Travis Conrad 9/10  Consults: General surgery Cardiology Interventional radiology  DVT Prophylaxis : SCDs Start: 02/20/21 0002   Subjective:  Patient in bed, appears comfortable, denies any headache,  no fever, no chest pain or pressure, no shortness of breath, no new focal weakness, still has postop pain, not passing flatus yesterday, scrotal discomfort is much improved.    Assessment/Plan:  Severe sepsis due to diverticulitis with contained perforation and small abscess: Sepsis physiology has resolved-although continues to have leukocytosis-remains on Zosyn.  Underwent CT- guided drainage of pelvic abscess on 02/25/21 followed by ex.Laprotomy, sigmoid colectomy, end colostomy with enterorrhaphy and drainage of intraabdominal abscess - Travis Conrad and Travis Conrad  on 02/26/2021, continue TNA, NG tube to intermittent suction, supportive care and monitor.  Will discontinue Zosyn once okay by general surgery.  Ileus: Now postop ileus continue NG tube, see #1 above.    AKI: Hemodynamically mediated-improved-creatinine has now normalized.  Hypokalemia/hypophosphatemia: Repleted and has normalized.  Continue to follow periodically.  PAF with RVR: RVR provoked by sepsis/alcohol withdrawal-currently in sinus rhythm-remains on amiodarone infusion added PO Lopressor 02/27/21 .  On IV heparin-Xarelto on hold due to the small possibility that patient may require a laparotomy.  Cardiology following.     Right scrotal pain and swelling due to epididymitis : Has been placed on doxycycline in addition to Zosyn, symptoms improving continue to monitor, denies any exposure or unprotected sex in the last few years, no unusual discharge.  From epididymitis standpoint he needs doxycycline chronically.  ?Alcohol withdrawal: Awake and alert this morning-last drink was on 9/2, counseled to quit alcohol, no DTs, out of DT timeframe.  HTN: Placed on oral beta-blocker, Norvasc add Catapres + PRN Hydralazine, pain control and monitor.  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   Diet: Diet Order             Diet NPO time specified  Except for: Sips with Meds  Diet effective  now                    Code Status:  Full code  Family Communication:  Spouse-Travis Conrad-[phone] updated at bedside on 9/9  Disposition Plan:  Status is: Inpatient  Remains inpatient appropriate because:Inpatient level of care appropriate due to severity of illness  Dispo: The patient is from: Home              Anticipated d/c is to: Home              Patient currently is not medically stable to d/c.   Difficult to place patient No   Barriers to Discharge: Perforated diverticulitis with abscess-on IV antibiotics-developing alcohol withdrawal symptoms/PAF with RVR.  Antimicrobial agents: Anti-infectives (From admission, onward)    Start     Dose/Rate Route Frequency Ordered Stop   02/26/21 0100  doxycycline (VIBRAMYCIN) 100 mg in sodium chloride 0.9 % 250 mL IVPB        100 mg 125 mL/hr over 120 Minutes Intravenous 2 times daily 02/26/21 0005     02/20/21 0300  piperacillin-tazobactam (ZOSYN) IVPB 3.375 g        3.375 g 12.5 mL/hr over 240 Minutes Intravenous Every 8 hours 02/19/21 1907     02/19/21 1900  ceFEPIme (MAXIPIME) 2 g in sodium chloride 0.9 % 100 mL IVPB  Status:  Discontinued        2 g 200 mL/hr over 30 Minutes Intravenous  Once 02/19/21 1857 02/19/21 1906   02/19/21 1900  metroNIDAZOLE (FLAGYL) IVPB 500 mg  Status:  Discontinued        500 mg 100 mL/hr over 60 Minutes Intravenous  Once 02/19/21 1857 02/19/21 1906   02/19/21 1815  piperacillin-tazobactam (ZOSYN) IVPB 3.375 g        3.375 g 100 mL/hr over 30 Minutes Intravenous  Once 02/19/21 1809 02/19/21 1944        Time spent: 35 minutes-Greater than 50% of this time was spent in counseling, explanation of diagnosis, planning of further management, and coordination of care.  MEDICATIONS: Scheduled Meds:  amLODipine  10 mg Per Tube Daily   Chlorhexidine Gluconate Cloth  6 each Topical Daily   folic acid  1 mg Per Tube Daily   insulin aspart  0-15  Units Subcutaneous Q4H   lidocaine  1 patch Transdermal Q24H   melatonin  3 mg Per Tube QHS   metoprolol tartrate  100 mg Per Tube BID   nicotine  21 mg Transdermal Daily   pantoprazole (PROTONIX) IV  40 mg Intravenous Q24H   sodium chloride flush  10 mL Intracatheter Q12H   thiamine  100 mg Per Tube Daily   Or   thiamine  100 mg Intravenous Daily   Continuous Infusions:  sodium chloride Stopped (02/27/21 0414)   amiodarone 30 mg/hr (03/01/21 0404)   doxycycline (VIBRAMYCIN) IV Stopped (03/01/21 0003)   heparin 1,900 Units/hr (03/01/21 0410)   methocarbamol (ROBAXIN) IV 500 mg (03/01/21 0550)   piperacillin-tazobactam (ZOSYN)  IV 3.375 g (03/01/21 0404)   TPN ADULT (ION) 85 mL/hr at 03/01/21 0415   TPN ADULT (ION)     PRN Meds:.sodium chloride, hydrALAZINE, menthol-cetylpyridinium, metoprolol tartrate, morphine injection, [DISCONTINUED] ondansetron **OR** ondansetron (ZOFRAN) IV, oxyCODONE, zolpidem   PHYSICAL EXAM: Vital signs: Vitals:   02/28/21 2009 02/28/21 2300 03/01/21 0406 03/01/21 0800  BP: (!) 173/73 (!) 146/63 (!) 154/68 (!) 181/69  Pulse: 80 67 77 80  Resp: '19 14 16 '$ 19  Temp: 99 F (37.2 C) 98.9 F (37.2 C) 97.6 F (36.4 C) 98.3 F (36.8 C)  TempSrc: Oral  Oral Oral  SpO2: 99% 97% 96% 96%  Weight:      Height:       Filed Weights   02/19/21 1800 02/20/21 2000 02/26/21 0913  Weight: 79.4 kg 79.4 kg 82.6 kg   Body mass index is 24.68 kg/m.   Exam  Awake Alert, No new F.N deficits, NG in place, colostomy in place, scrotal swelling improving [place].AT,PERRAL Supple Neck,No JVD, No cervical lymphadenopathy appriciated.  Symmetrical Chest wall movement, Good air movement bilaterally, CTAB RRR,No Gallops, Rubs or new Murmurs, No Parasternal Heave +ve B.Sounds, Abd Soft, No tenderness, No organomegaly appriciated, No rebound - guarding or rigidity. No Cyanosis, Clubbing or edema, No new Rash or bruise    I have personally reviewed following labs and imaging  studies  LABORATORY DATA:  Recent Labs  Lab 02/24/21 0356 02/25/21 0144 02/26/21 0135 02/27/21 0322 03/01/21 0441  WBC 14.6* 15.6* 16.6* 19.6* 13.9*  HGB 12.8* 13.8 12.9* 12.4* 11.3*  HCT 37.7* 39.5 37.3* 36.4* 33.3*  PLT 286 337 334 355 390  MCV 102.7* 101.0* 100.8* 100.6* 101.8*  MCH 34.9* 35.3* 34.9* 34.3* 34.6*  MCHC 34.0 34.9 34.6 34.1 33.9  RDW 13.2 13.2 13.2 13.2 14.2    Recent Labs  Lab 02/23/21 0120 02/24/21 0356 02/25/21 0144 02/25/21 0957 02/26/21 0135 02/27/21 0322 02/28/21 0440 03/01/21 0441  NA 136   < > 135  --  134* 134* 135 134*  K 3.7   < > 4.0  --  3.9 4.1 4.0 4.2  CL 98   < > 98  --  99 101 101 102  CO2 30   < > 26  --  '28 26 26 24  '$ GLUCOSE 96   < > 91  --  179* 191* 128* 147*  BUN 19   < > 11  --  '14 13 13 15  '$ CREATININE 1.32*   < > 1.22  --  1.07 0.91 0.97 0.82  CALCIUM 8.5*   < > 8.3*  --  8.2* 8.1* 8.3* 8.0*  AST 15  --  12*  --   --   --  17  --   ALT 13  --  12  --   --   --  10  --   ALKPHOS 64  --  50  --   --   --  50  --   BILITOT 0.4  --  1.1  --   --   --  0.4  --   ALBUMIN 2.2*  --  2.0*  --   --   --  1.7*  --   MG 2.0   < > 1.8  --  2.0 1.9 1.9 2.0  HGBA1C  --   --   --  5.9*  --   --   --   --    < > = values in this interval not displayed.     RADIOLOGY STUDIES/RESULTS: No results found.   LOS: 9 days   Signature  Lala Lund M.D on 03/01/2021 at 8:45 AM   -  To page go to [email or web address]    03/01/2021, 8:45 AM

## 2021-03-01 NOTE — Progress Notes (Signed)
Verbal order to stop heparin for now until notified differently by Dr. Candiss Norse. This nurse stopped heparin drip.

## 2021-03-01 NOTE — Progress Notes (Signed)
Progress Note  Patient Name: Travis Conrad Date of Encounter: 03/01/2021  [facility] Cardiologist: [name], MD    Subjective    70 yo admitted with N/V abdominal pain  Has parox Afib  Getting IV amio  Is in NSR   Inpatient Medications    Scheduled Meds:  amLODipine  10 mg Per Tube Daily   Chlorhexidine Gluconate Cloth  6 each Topical Daily   cloNIDine  0.1 mg Oral BID   folic acid  1 mg Per Tube Daily   insulin aspart  0-15 Units Subcutaneous Q4H   lidocaine  1 patch Transdermal Q24H   melatonin  3 mg Per Tube QHS   metoprolol tartrate  100 mg Per Tube BID   nicotine  21 mg Transdermal Daily   pantoprazole (PROTONIX) IV  40 mg Intravenous Q24H   sodium chloride flush  10 mL Intracatheter Q12H   thiamine  100 mg Per Tube Daily   Or   thiamine  100 mg Intravenous Daily   Continuous Infusions:  sodium chloride Stopped (02/27/21 0414)   amiodarone 30 mg/hr (03/01/21 0404)   doxycycline (VIBRAMYCIN) IV 100 mg (03/01/21 1006)   heparin 1,750 Units/hr (03/01/21 0849)   methocarbamol (ROBAXIN) IV 1,000 mg (03/01/21 1221)   piperacillin-tazobactam (ZOSYN)  IV 3.375 g (03/01/21 1012)   TPN ADULT (ION) 85 mL/hr at 03/01/21 0415   TPN ADULT (ION)     PRN Meds: sodium chloride, hydrALAZINE, menthol-cetylpyridinium, metoprolol tartrate, morphine injection, [DISCONTINUED] ondansetron **OR** ondansetron (ZOFRAN) IV, oxyCODONE, zolpidem   Vital Signs    Vitals:   03/01/21 1014 03/01/21 1119 03/01/21 1139 03/01/21 1221  BP: (!) 172/102 (!) 184/82 (!) 157/66 (!) 161/69  Pulse:  72 70 72  Resp:  17 20   Temp:   97.6 F (36.4 C)   TempSrc:   Oral   SpO2:   97% 98%  Weight:      Height:        Intake/Output Summary (Last 24 hours) at 03/01/2021 1510 Last data filed at 03/01/2021 1500 Gross per 24 hour  Intake 4932.07 ml  Output 2700 ml  Net 2232.07 ml   Last 3 Weights 02/26/2021 02/20/2021 02/19/2021  Weight (lbs) 182 lb 175 lb 0.7 oz 175 lb 0.7 oz  Weight (kg)  82.555 kg 79.4 kg 79.4 kg      Telemetry    NSR - Personally Reviewed  ECG     - Personally Reviewed  Physical Exam   GEN: middle age male,  NAD  Neck: No JVD Cardiac: RRR, no murmurs, rubs, or gallops.  Respiratory: Clear to auscultation bilaterally. GI: abdomen is tender  MS: No edema; No deformity. Neuro:  Nonfocal  Psych: Normal affect   Labs    High Sensitivity Troponin:  No results for input(s): TROPONINIHS in the last 720 hours.    Chemistry Recent Labs  Lab 02/23/21 0120 02/24/21 0356 02/25/21 0144 02/26/21 0135 02/27/21 0322 02/28/21 0440 03/01/21 0441  NA 136   < > 135   < > 134* 135 134*  K 3.7   < > 4.0   < > 4.1 4.0 4.2  CL 98   < > 98   < > 101 101 102  CO2 30   < > 26   < > '26 26 24  '$ GLUCOSE 96   < > 91   < > 191* 128* 147*  BUN 19   < > 11   < > 13 13 15  CREATININE 1.32*   < > 1.22   < > 0.91 0.97 0.82  CALCIUM 8.5*   < > 8.3*   < > 8.1* 8.3* 8.0*  PROT 5.4*  --  5.3*  --   --  5.0*  --   ALBUMIN 2.2*  --  2.0*  --   --  1.7*  --   AST 15  --  12*  --   --  17  --   ALT 13  --  12  --   --  10  --   ALKPHOS 64  --  50  --   --  50  --   BILITOT 0.4  --  1.1  --   --  0.4  --   GFRNONAA 58*   < > >60   < > >60 >60 >60  ANIONGAP 8   < > 11   < > '7 8 8   '$ < > = values in this interval not displayed.     Hematology Recent Labs  Lab 02/26/21 0135 02/27/21 0322 03/01/21 0441  WBC 16.6* 19.6* 13.9*  RBC 3.70* 3.62* 3.27*  HGB 12.9* 12.4* 11.3*  HCT 37.3* 36.4* 33.3*  MCV 100.8* 100.6* 101.8*  MCH 34.9* 34.3* 34.6*  MCHC 34.6 34.1 33.9  RDW 13.2 13.2 14.2  PLT 334 355 390    BNPNo results for input(s): BNP, PROBNP in the last 168 hours.   DDimer No results for input(s): DDIMER in the last 168 hours.   Radiology    No results found.  Cardiac Studies      Patient Profile     70 y.o. male  with PAF   Assessment & Plan    PAF :  remains in NSR .  On IV amio Plan is to change to PO amio once he is taking PO well Restart  Xarelto once he is taking PO and after we are sure he will not need any further procedures.        For questions or updates, please contact [place] Please consult [email or web address] for contact info under        Signed, Mertie Moores, MD  03/01/2021, 3:10 PM

## 2021-03-02 DIAGNOSIS — I482 Chronic atrial fibrillation, unspecified: Secondary | ICD-10-CM | POA: Diagnosis not present

## 2021-03-02 LAB — CBC
HCT: 32.3 % — ABNORMAL LOW (ref 39.0–52.0)
Hemoglobin: 10.8 g/dL — ABNORMAL LOW (ref 13.0–17.0)
MCH: 34.3 pg — ABNORMAL HIGH (ref 26.0–34.0)
MCHC: 33.4 g/dL (ref 30.0–36.0)
MCV: 102.5 fL — ABNORMAL HIGH (ref 80.0–100.0)
Platelets: 409 10*3/uL — ABNORMAL HIGH (ref 150–400)
RBC: 3.15 MIL/uL — ABNORMAL LOW (ref 4.22–5.81)
RDW: 14.4 % (ref 11.5–15.5)
WBC: 13.5 10*3/uL — ABNORMAL HIGH (ref 4.0–10.5)
nRBC: 0 % (ref 0.0–0.2)

## 2021-03-02 LAB — GLUCOSE, CAPILLARY
Glucose-Capillary: 138 mg/dL — ABNORMAL HIGH (ref 70–99)
Glucose-Capillary: 139 mg/dL — ABNORMAL HIGH (ref 70–99)
Glucose-Capillary: 159 mg/dL — ABNORMAL HIGH (ref 70–99)
Glucose-Capillary: 164 mg/dL — ABNORMAL HIGH (ref 70–99)
Glucose-Capillary: 166 mg/dL — ABNORMAL HIGH (ref 70–99)
Glucose-Capillary: 168 mg/dL — ABNORMAL HIGH (ref 70–99)
Glucose-Capillary: 171 mg/dL — ABNORMAL HIGH (ref 70–99)

## 2021-03-02 LAB — HEPARIN LEVEL (UNFRACTIONATED): Heparin Unfractionated: 0.12 IU/mL — ABNORMAL LOW (ref 0.30–0.70)

## 2021-03-02 MED ORDER — TRAVASOL 10 % IV SOLN
INTRAVENOUS | Status: AC
  Filled 2021-03-02: qty 1020

## 2021-03-02 MED ORDER — CLONIDINE HCL 0.1 MG PO TABS
0.1000 mg | ORAL_TABLET | Freq: Two times a day (BID) | ORAL | Status: DC
  Administered 2021-03-02 – 2021-03-06 (×8): 0.1 mg
  Filled 2021-03-02 (×8): qty 1

## 2021-03-02 NOTE — Progress Notes (Addendum)
Progress Note  Patient Name: Travis Conrad Date of Encounter: 03/02/2021  [facility] Cardiologist: [name], MD   Subjective   No chest pain no shortness of breath, wife at bedside.  NG tube in place.  Inpatient Medications    Scheduled Meds:  amLODipine  10 mg Per Tube Daily   Chlorhexidine Gluconate Cloth  6 each Topical Daily   cloNIDine  0.1 mg Per Tube BID   folic acid  1 mg Per Tube Daily   insulin aspart  0-15 Units Subcutaneous Q4H   lidocaine  1 patch Transdermal Q24H   melatonin  3 mg Per Tube QHS   metoprolol tartrate  100 mg Per Tube BID   nicotine  21 mg Transdermal Daily   pantoprazole (PROTONIX) IV  40 mg Intravenous Q24H   sodium chloride flush  10 mL Intracatheter Q12H   thiamine  100 mg Per Tube Daily   Or   thiamine  100 mg Intravenous Daily   Continuous Infusions:  sodium chloride Stopped (02/27/21 0414)   amiodarone 30 mg/hr (03/02/21 0004)   doxycycline (VIBRAMYCIN) IV 100 mg (03/02/21 1041)   heparin 1,600 Units/hr (03/01/21 2306)   methocarbamol (ROBAXIN) IV 1,000 mg (03/02/21 0518)   piperacillin-tazobactam (ZOSYN)  IV 3.375 g (03/02/21 1045)   TPN ADULT (ION) 85 mL/hr at 03/02/21 0322   PRN Meds: sodium chloride, hydrALAZINE, menthol-cetylpyridinium, metoprolol tartrate, morphine injection, [DISCONTINUED] ondansetron **OR** ondansetron (ZOFRAN) IV, oxyCODONE, zolpidem   Vital Signs    Vitals:   03/01/21 2000 03/01/21 2310 03/02/21 0322 03/02/21 0700  BP: (!) 145/60 (!) 119/57 (!) 129/56 (!) 173/69  Pulse: 87 75 75 82  Resp: '16 16 16 18  '$ Temp:    97.8 F (36.6 C)  TempSrc: Oral   Oral  SpO2: 97% 97% 97% 99%  Weight:      Height:        Intake/Output Summary (Last 24 hours) at 03/02/2021 1057 Last data filed at 03/02/2021 1045 Gross per 24 hour  Intake 3851.81 ml  Output 1800 ml  Net 2051.81 ml   Last 3 Weights 02/26/2021 02/20/2021 02/19/2021  Weight (lbs) 182 lb 175 lb 0.7 oz 175 lb 0.7 oz  Weight (kg) 82.555 kg 79.4 kg  79.4 kg      Telemetry    Sinus rhythm- Personally Reviewed  ECG    No new- Personally Reviewed  Physical Exam   GEN: No acute distress.  NG tube. Neck: No JVD Cardiac: RRR, no murmurs, rubs, or gallops.  Respiratory: Clear to auscultation bilaterally. GI: Decreased bowel sounds, JP drain in place MS: No edema; No deformity. Neuro:  Nonfocal  Psych: Normal affect   Labs    High Sensitivity Troponin:  No results for input(s): TROPONINIHS in the last 720 hours.    Chemistry Recent Labs  Lab 02/25/21 0144 02/26/21 0135 02/27/21 0322 02/28/21 0440 03/01/21 0441  NA 135   < > 134* 135 134*  K 4.0   < > 4.1 4.0 4.2  CL 98   < > 101 101 102  CO2 26   < > '26 26 24  '$ GLUCOSE 91   < > 191* 128* 147*  BUN 11   < > '13 13 15  '$ CREATININE 1.22   < > 0.91 0.97 0.82  CALCIUM 8.3*   < > 8.1* 8.3* 8.0*  PROT 5.3*  --   --  5.0*  --   ALBUMIN 2.0*  --   --  1.7*  --  AST 12*  --   --  17  --   ALT 12  --   --  10  --   ALKPHOS 50  --   --  50  --   BILITOT 1.1  --   --  0.4  --   GFRNONAA >60   < > >60 >60 >60  ANIONGAP 11   < > '7 8 8   '$ < > = values in this interval not displayed.     Hematology Recent Labs  Lab 03/01/21 0441 03/01/21 1901 03/02/21 0659  WBC 13.9* 14.7* 13.5*  RBC 3.27* 3.28* 3.15*  HGB 11.3* 11.2* 10.8*  HCT 33.3* 33.9* 32.3*  MCV 101.8* 103.4* 102.5*  MCH 34.6* 34.1* 34.3*  MCHC 33.9 33.0 33.4  RDW 14.2 14.1 14.4  PLT 390 432* 409*     Patient Profile     70 y.o. male with paroxysmal atrial fibrillation RVR in the setting of severe sepsis  Assessment & Plan    Paroxysmal atrial fibrillation - Maintaining sinus rhythm.  Excellent.  As his infection improves, atrial fibrillation improved as well. -On IV amiodarone.  Metoprolol as well. -When able to take p.o. well, changed to p.o. amiodarone.  Chronic anticoagulation - When able to from a surgical perspective, restart Xarelto. -Currently on IV heparin  No changes today.    For  questions or updates, please contact [place] Please consult [email or web address] for contact info under        Signed, Candee Furbish, MD  03/02/2021, 10:57 AM

## 2021-03-02 NOTE — Progress Notes (Signed)
PROGRESS NOTE        PATIENT DETAILS Name: Travis Conrad Age: 70 y.o. Sex: male Date of Birth: 11-18-1950 Admit Date: 02/19/2021 Admitting Physician Etta Quill, DO PCP:Sun, [name], MD  Brief Narrative: Patient is a 70 y.o. male A. fib, HTN, HLD, prior diverticulitis, heavy EtOH use-who presented with lower abdominal pain/nausea/vomiting-on initial presentation to the ED found to have severe sepsis physiology with hypotension/AKI-felt to be due to perforated diverticulitis.  See below for further details.  Significant events: 9/3>> presented with lower abdominal pain/vomiting-sepsis from contained perforated diverticulitis.  Found to have very active ileus/PSBO from diverticulitis/bowel inflammation 9/4>> A. fib with RVR-not responding to Lopressor/Cardizem-BP soft-started amiodarone infusion-cardiology consulted. 9/7>> increase in size of pelvic abscess on repeat CT. 9/8>> CT-guided drainage of pelvic abscess 9/9>> right testicular pain/swelling-scrotal ultrasound with Doppler pending.  Significant studies: 9/3>> CT abdomen/pelvis: Acute sigmoid diverticulitis-small extraluminal gas-fluid collection in the sigmoid mesocolon consistent with abscess.  Dilated proximal small bowel-likely ileus. 9/7>> CT abdomen/pelvis: Abscess has increased in size 9/7>> Echo: EF 60-65%.  Antimicrobial therapy: Zosyn: 9/3>>  Microbiology data: 9/3>> COVID/influenza PCR: Negative 9/3>> blood culture: Negative 9/8>> culture pelvic abscess: Pending.  Procedures : 9/8>> CT-guided placement of 12 French drainage catheter into pelvic collection. 9/10>> s/p ex lap, sigmoid colectomy, end colostomy with enterorrhaphy and drainage of intraabdominal abscess - Dr. Kieth Brightly and Dr. Radene Knee 9/10  Consults: General surgery Cardiology Interventional radiology Urology  DVT Prophylaxis : SCDs Start: 02/20/21 0002>> Heparin drip.   Subjective: Patient in bed denies any headache  chest pain, no shortness of breath or cough, postop abdominal pain continues, still not passing flatus, no focal weakness.  Scrotal pain is better.   Assessment/Plan:  Severe sepsis due to diverticulitis with contained perforation and small abscess: Sepsis physiology has resolved-although continues to have leukocytosis - remains on Zosyn.  Underwent CT- guided drainage of pelvic abscess on 02/25/21 followed by ex.Laprotomy, sigmoid colectomy, end colostomy with enterorrhaphy and drainage of intraabdominal abscess - Dr. Kieth Brightly and Dr. Radene Knee  on 02/26/2021, continue TNA, NG tube to intermittent suction, supportive care and monitor.  Will discontinue Zosyn once okay by general surgery.  He does have some intermittent oozing and bleeding from the colostomy site, general surgery is monitoring, H&H is stable.  Currently on heparin drip.   Ileus: Now postop ileus continue NG tube, see #1 above.    PAF with RVR: RVR provoked by sepsis/alcohol withdrawal-currently in sinus rhythm-remains on amiodarone infusion added PO Lopressor 02/27/21 .  On IV heparin-Xarelto on hold due to the small possibility that patient may require a laparotomy.  Cardiology following.     Right scrotal pain and swelling due to epididymitis : Has been placed on doxycycline in addition to Zosyn, symptoms improving continue to monitor, denies any exposure or unprotected sex in the last few years, no unusual discharge.  From epididymitis standpoint he needs doxycycline which will be continued, also have requested urology to take a peek at him on 03/02/2021.  Overall pain is better but still has swelling and redness.  AKI: Hemodynamically mediated-improved-creatinine has now normalized.  Hypokalemia/hypophosphatemia: Repleted and has normalized.  Continue to follow periodically.  ?Alcohol withdrawal: Awake and alert this morning-last drink was on 9/2, counseled to quit alcohol, no DTs, out of DT timeframe.  HTN: Placed on oral  beta-blocker, Norvasc added Catapres + PRN Hydralazine, pain  control and monitor.  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   Diet: Diet Order             Diet NPO time specified Except for: Sips with Meds  Diet effective now                    Code Status:  Full code  Family Communication:  Spouse-Jimmi [phone] updated 03/02/21  Disposition Plan:  Status is: Inpatient  Remains inpatient appropriate because:Inpatient level of care appropriate due to severity of illness  Dispo: The patient is from: Home              Anticipated d/c is to: Home              Patient currently is not medically stable to d/c.   Difficult to place patient No   Barriers to Discharge: Perforated diverticulitis with abscess-on IV antibiotics-developing alcohol withdrawal symptoms/PAF with RVR.  Antimicrobial agents: Anti-infectives (From admission, onward)    Start     Dose/Rate Route Frequency Ordered Stop   02/26/21 0100  doxycycline (VIBRAMYCIN) 100 mg in sodium chloride 0.9 % 250 mL IVPB        100 mg 125 mL/hr over 120 Minutes Intravenous 2 times daily 02/26/21 0005     02/20/21 0300  piperacillin-tazobactam (ZOSYN) IVPB 3.375 g        3.375 g 12.5 mL/hr over 240 Minutes Intravenous Every 8 hours 02/19/21 1907     02/19/21 1900  ceFEPIme (MAXIPIME) 2 g in sodium chloride 0.9 % 100 mL IVPB  Status:  Discontinued        2 g 200 mL/hr over 30 Minutes Intravenous  Once 02/19/21 1857 02/19/21 1906   02/19/21 1900  metroNIDAZOLE (FLAGYL) IVPB 500 mg  Status:  Discontinued        500 mg 100 mL/hr over 60 Minutes Intravenous  Once 02/19/21 1857 02/19/21 1906   02/19/21 1815  piperacillin-tazobactam (ZOSYN) IVPB 3.375 g        3.375 g 100 mL/hr over 30 Minutes Intravenous  Once 02/19/21 1809 02/19/21 1944        Time spent: 35 minutes-Greater than 50% of this time was spent in counseling,  explanation of diagnosis, planning of further management, and coordination of care.  MEDICATIONS: Scheduled Meds:  amLODipine  10 mg Per Tube Daily   Chlorhexidine Gluconate Cloth  6 each Topical Daily   cloNIDine  0.1 mg Oral BID   folic acid  1 mg Per Tube Daily   insulin aspart  0-15 Units Subcutaneous Q4H   lidocaine  1 patch Transdermal Q24H   melatonin  3 mg Per Tube QHS   metoprolol tartrate  100 mg Per Tube BID   nicotine  21 mg Transdermal Daily   pantoprazole (PROTONIX) IV  40 mg Intravenous Q24H   sodium chloride flush  10 mL Intracatheter Q12H   thiamine  100 mg Per Tube Daily   Or   thiamine  100 mg Intravenous Daily   Continuous Infusions:  sodium chloride Stopped (02/27/21 0414)   amiodarone 30 mg/hr (03/02/21 0004)   doxycycline (VIBRAMYCIN) IV Stopped (03/01/21 2314)   heparin 1,600 Units/hr (03/01/21 2306)   methocarbamol (ROBAXIN) IV 1,000 mg (03/02/21 0518)   piperacillin-tazobactam (ZOSYN)  IV 3.375 g (03/02/21 0318)   TPN ADULT (ION) 85 mL/hr at 03/02/21 0322   PRN Meds:.sodium chloride, hydrALAZINE, menthol-cetylpyridinium, metoprolol tartrate, morphine injection, [DISCONTINUED]  ondansetron **OR** ondansetron (ZOFRAN) IV, oxyCODONE, zolpidem   PHYSICAL EXAM: Vital signs: Vitals:   03/01/21 2000 03/01/21 2310 03/02/21 0322 03/02/21 0700  BP: (!) 145/60 (!) 119/57 (!) 129/56 (!) 173/69  Pulse: 87 75 75 82  Resp: '16 16 16 18  '$ Temp:    97.8 F (36.6 C)  TempSrc: Oral   Oral  SpO2: 97% 97% 97% 99%  Weight:      Height:       Filed Weights   02/19/21 1800 02/20/21 2000 02/26/21 0913  Weight: 79.4 kg 79.4 kg 82.6 kg   Body mass index is 24.68 kg/m.   Exam  Awake Alert, No new F.N deficits, NG in place, colostomy in place, scrotal swelling slowly improving [place].AT,PERRAL Supple Neck,No JVD, No cervical lymphadenopathy appriciated.  Symmetrical Chest wall movement, Good air movement bilaterally, CTAB RRR,No Gallops, Rubs or new Murmurs, No  Parasternal Heave +ve B.Sounds, Abd Soft, No tenderness, No organomegaly appriciated, No rebound - guarding or rigidity. No Cyanosis, Clubbing or edema, No new Rash or bruise     I have personally reviewed following labs and imaging studies  LABORATORY DATA:  Recent Labs  Lab 02/26/21 0135 02/27/21 0322 03/01/21 0441 03/01/21 1901 03/02/21 0659  WBC 16.6* 19.6* 13.9* 14.7* 13.5*  HGB 12.9* 12.4* 11.3* 11.2* 10.8*  HCT 37.3* 36.4* 33.3* 33.9* 32.3*  PLT 334 355 390 432* 409*  MCV 100.8* 100.6* 101.8* 103.4* 102.5*  MCH 34.9* 34.3* 34.6* 34.1* 34.3*  MCHC 34.6 34.1 33.9 33.0 33.4  RDW 13.2 13.2 14.2 14.1 14.4  LYMPHSABS  --   --   --  2.5  --   MONOABS  --   --   --  1.3*  --   EOSABS  --   --   --  0.0  --   BASOSABS  --   --   --  0.1  --     Recent Labs  Lab 02/25/21 0144 02/25/21 0957 02/26/21 0135 02/27/21 0322 02/28/21 0440 03/01/21 0441  NA 135  --  134* 134* 135 134*  K 4.0  --  3.9 4.1 4.0 4.2  CL 98  --  99 101 101 102  CO2 26  --  '28 26 26 24  '$ GLUCOSE 91  --  179* 191* 128* 147*  BUN 11  --  '14 13 13 15  '$ CREATININE 1.22  --  1.07 0.91 0.97 0.82  CALCIUM 8.3*  --  8.2* 8.1* 8.3* 8.0*  AST 12*  --   --   --  17  --   ALT 12  --   --   --  10  --   ALKPHOS 50  --   --   --  50  --   BILITOT 1.1  --   --   --  0.4  --   ALBUMIN 2.0*  --   --   --  1.7*  --   MG 1.8  --  2.0 1.9 1.9 2.0  HGBA1C  --  5.9*  --   --   --   --      RADIOLOGY STUDIES/RESULTS: No results found.   LOS: 10 days   Signature  Lala Lund M.D on 03/02/2021 at 9:40 AM   -  To page go to [email or web address]    03/02/2021, 9:40 AM

## 2021-03-02 NOTE — Consult Note (Signed)
[place] Nurse wound follow up Photo in chart Wound type:midline abdominal  Measurement:measured Monday Wound [id number] red Drainage (amount, consistency, odor) moderate bloody drainage in canister Periwound:LUQ colostomy Dressing procedure/placement/frequency:Fill wound bed with 1 piece black foam.  Cover with drape and apply TRAC pad.  Seal immediately achieved at 125 mmHg.. Change Mon/Wed/Fri [facility] Nurse ostomy follow up Stoma type/location: LUQ colostomy  no stool, ongoing ileus.  NG in place.  Wife at bedside.  States she does not feel well today and can observe but not perform ostomy care.   Stomal assessment/size: 1 3/8" flush.  Stoma pale pink and moist Peristomal assessment: intact midline abdominal incision Treatment options for stomal/peristomal skin: barrier ring and convex pouch Output blood tinged liquid only Ostomy pouching: 1pc.convex pouch Education provided: pouch change performed.  Discussed twice weekly pouch changes after discharge We measured and cut the pouch opening.  We applied barrier ring and pouch.  Wife observes and is able to close velcro closure.  She agrees to participate Friday.   Enrolled patient in [place] Start Discharge program: Yes  will today.  Will follow.  Domenic Moras MSN, RN, FNP-BC [facility]ON Wound, Ostomy, Continence Nurse Pager [phone]

## 2021-03-02 NOTE — Progress Notes (Signed)
PHARMACY - TOTAL PARENTERAL NUTRITION CONSULT NOTE   Indication: Prolonged ileus  Patient Measurements: Height: 6' (182.9 cm) Weight: 82.6 kg (182 lb) IBW/kg (Calculated) : 77.6 TPN AdjBW (KG): 82.6 Body mass index is 24.68 kg/m.  Assessment: 28 YOM with lower abdominal pain/N/V found to have perforated diverticulitis. CT abd on 9/3 found to have active ileus/pSBO from diverticulitis/bowel inflammation. Pharmacy consulted to start TPN for prolonged ileus. Of note patient has a h/o alcohol dependency and is at high risk for refeeding.   Glucose / Insulin: no hx DM, CBGs 140-170s. 16 units mSSI utilized in last 24hrs Electrolytes: last labs from 9/13 - Na 134, K stable 4.2 (goal >/=4 with ileus), Mg 2 (goal >/=2 with ileus), Phos 3.4 Renal: SCr 0.82 stable, BUN WNL Hepatic: LFTs / Tbili / TG wnl, Albumin 1.7 Intake / Output; MIVF: NG 1575m, Drain output 301m UOP 0.30m36mg/hr  GI Imaging: 9/3 CT abd: Active ileus/PSBO  GI Surgeries / Procedures: 9/10 ex-lap, sigmoid colectomy w/ end colostomy creation, enterorrhaphy, drainage of intra-abd abscess, wound vac placement  Central access: PICC line 9/8 >>  TPN start date: 9/9>>   Nutritional Goals: Goal TPN rate is 85 mL/hr (provides 102 g of protein and 2104 kcals per day)  RD Assessment: Estimated Needs Total Energy Estimated Needs: 2000-2200 Total Protein Estimated Needs: 100-110 grams Total Fluid Estimated Needs: >2L  Current Nutrition:  TPN; NPO  Plan:  Continue TPN at goal rate 12m530m at 1800 Electrolytes in TPN: Na 100mE57m K 50mEq66mCa 5mEq/L87mg 9mEq/L,87md Phos 20mmol/L15m:Ac 1:1 Add standard MVI and trace elements to TPN Continue Moderate q4h SSI and adjust as needed Monitor standard TPN labs Mon/Thurs and PRN (next 9/15)   Lysette Lindenbaum vonArturo Morton BCPS Please check AMION for all [facility] Clinical Pharmacist 03/02/2021 7:48 AM

## 2021-03-02 NOTE — Progress Notes (Addendum)
Upon assessment this AM this nurse noticed that blue line connected to NG tube was leaking. Dr. Candiss Norse came in to room and this nurse notified him about the leak. TPN was connected to PICC along with Heparin and Amio Drip this AM.

## 2021-03-02 NOTE — Progress Notes (Signed)
ANTICOAGULATION CONSULT NOTE - Follow Up Consult  Pharmacy Consult for Heparin Indication: atrial fibrillation  Allergies  Allergen Reactions   Atorvastatin Other (See Comments)    myalgias    Patient Measurements: Height: 6' (182.9 cm) Weight: 82.6 kg (182 lb) IBW/kg (Calculated) : 77.6 Heparin Dosing Weight: 82.6 kg  Vital Signs: Temp: 97.9 F (36.6 C) (09/14 1121) Temp Source: Oral (09/14 1121) BP: 135/59 (09/14 1300) Pulse Rate: 81 (09/14 1300)  Labs: Recent Labs    02/28/21 0440 02/28/21 0440 03/01/21 0441 03/01/21 1704 03/01/21 1901 03/02/21 0659 03/02/21 0823  HGB  --    < > 11.3*  --  11.2* 10.8*  --   HCT  --   --  33.3*  --  33.9* 32.3*  --   PLT  --   --  390  --  432* 409*  --   HEPARINUNFRC 0.53  --  0.89* 0.82*  --   --  0.12*  CREATININE 0.97  --  0.82  --   --   --   --    < > = values in this interval not displayed.     Estimated Creatinine Clearance: 92 mL/min (by C-G formula based on SCr of 0.82 mg/dL).  Assessment: 70 y/o male admitted for sepsis from contained perforated diverticulitis with active ileus. On PTA Xarelto for Afib with last dose on 9/2 at 2000.  On IV heparin; Xarelto held for possible need for surgery.       Repeat CT 9/7 with increased size of abscess, s/p IR drain 9/8. Heparin was stopped at midnight on 9/9 before surgery 9/10 and resumed  9/11 ~24 hours post-op. Initial heparin levels low after resuming, then therapeutic (0.53) on 9/12 and supratherapeutic on 9/13.     Heparin held last night for ~5 hours due to blood in colostomy bag. Resumed at lower rate of 1600 units/hr ~11pm last night.  Heparin level is subtherapeutic (0.12) today.  Blood-tinged output noted with ostomy bag change this morning. Hgb 11.2>10.8. Discussed with M, Maczis, PA-C. Per Dr. Barry Dienes, no heparin rate increase today.  Surgery to re-evaluate on 9/15 for possible rate increase if level remains subtherapeutic and no significant bleeding.   Goal of  Therapy:  Heparin level 0.3-0.5 units/ml (low end of therapeutic range) Monitor platelets by anticoagulation protocol: Yes   Plan:  Continue heparin drip at 1600 units/hr Daily heparin level and CBC. Follow up 9/15 for possible rate increase if heparin level remains low and no significant bleeding. Xarelto on hold.  Arty Baumgartner, RPh 03/02/2021,1:37 PM

## 2021-03-02 NOTE — Progress Notes (Signed)
4 Days Post-Op  Subjective: CC: Had some bleeding from ostomy.  Still no stool in bag.    Objective: Vital signs in last 24 hours: Temp:  [97.8 F (36.6 C)-97.9 F (36.6 C)] 97.9 F (36.6 C) (09/14 1121) Pulse Rate:  [75-87] 81 (09/14 1300) Resp:  [16-18] 17 (09/14 1300) BP: (119-173)/(56-69) 135/59 (09/14 1300) SpO2:  [97 %-99 %] 98 % (09/14 1300) Last BM Date: 02/22/21  Intake/Output from previous day: 09/13 0701 - 09/14 0700 In: 3781.8 [I.V.:2831; IV Piggyback:950.8] Out: 1520 [Urine:700; Emesis/NG output:600; Drains:220] Intake/Output this shift: Total I/O In: 1968.1 [I.V.:1348.9; Other:70; IV Piggyback:549.2] Out: 1480 [Urine:710; Emesis/NG output:700; Drains:70]  PE: Gen:  Alert, NAD, pleasant.  Looks uncomfortable.  Card:  Reg Pulm:  Normal rate and effort  Abd: Soft, mild distension, non tender.  Drain w/ SS output. Colostomy small amount of dark bloody drainage. NGT w/ bilious output.  Psych: A&[name]  Skin: no rashes noted, warm and dry     Lab Results:  Recent Labs    03/01/21 1901 03/02/21 0659  WBC 14.7* 13.5*  HGB 11.2* 10.8*  HCT 33.9* 32.3*  PLT 432* 409*   BMET Recent Labs    02/28/21 0440 03/01/21 0441  NA 135 134*  K 4.0 4.2  CL 101 102  CO2 26 24  GLUCOSE 128* 147*  BUN 13 15  CREATININE 0.97 0.82  CALCIUM 8.3* 8.0*   PT/INR No results for input(s): LABPROT, INR in the last 72 hours. CMP     Component Value Date/Time   NA 134 (L) 03/01/2021 0441   NA 134 09/30/2019 0959   NA 142 03/03/2016 0921   K 4.2 03/01/2021 0441   K 4.3 03/03/2016 0921   CL 102 03/01/2021 0441   CO2 24 03/01/2021 0441   CO2 25 03/03/2016 0921   GLUCOSE 147 (H) 03/01/2021 0441   GLUCOSE 89 03/03/2016 0921   BUN 15 03/01/2021 0441   BUN 11 09/30/2019 0959   BUN 13.6 03/03/2016 0921   CREATININE 0.82 03/01/2021 0441   CREATININE 1.2 03/03/2016 0921   CALCIUM 8.0 (L) 03/01/2021 0441   CALCIUM 9.6 03/03/2016 0921   PROT 5.0 (L) 02/28/2021 0440    PROT 7.3 03/03/2016 0921   ALBUMIN 1.7 (L) 02/28/2021 0440   ALBUMIN 3.8 03/03/2016 0921   AST 17 02/28/2021 0440   AST 36 (H) 03/03/2016 0921   ALT 10 02/28/2021 0440   ALT 31 03/03/2016 0921   ALKPHOS 50 02/28/2021 0440   ALKPHOS 116 03/03/2016 0921   BILITOT 0.4 02/28/2021 0440   BILITOT 0.71 03/03/2016 0921   GFRNONAA >60 03/01/2021 0441   GFRNONAA 67 07/25/2013 1712   GFRAA >60 10/03/2019 0930   GFRAA 77 07/25/2013 1712   Lipase     Component Value Date/Time   LIPASE 22 02/19/2021 1653    Studies/Results: No results found.  Anti-infectives: Anti-infectives (From admission, onward)    Start     Dose/Rate Route Frequency Ordered Stop   02/26/21 0100  doxycycline (VIBRAMYCIN) 100 mg in sodium chloride 0.9 % 250 mL IVPB        100 mg 125 mL/hr over 120 Minutes Intravenous 2 times daily 02/26/21 0005     02/20/21 0300  piperacillin-tazobactam (ZOSYN) IVPB 3.375 g        3.375 g 12.5 mL/hr over 240 Minutes Intravenous Every 8 hours 02/19/21 1907     02/19/21 1900  ceFEPIme (MAXIPIME) 2 g in sodium chloride 0.9 % 100 mL IVPB  Status:  Discontinued        2 g 200 mL/hr over 30 Minutes Intravenous  Once 02/19/21 1857 02/19/21 1906   02/19/21 1900  metroNIDAZOLE (FLAGYL) IVPB 500 mg  Status:  Discontinued        500 mg 100 mL/hr over 60 Minutes Intravenous  Once 02/19/21 1857 02/19/21 1906   02/19/21 1815  piperacillin-tazobactam (ZOSYN) IVPB 3.375 g        3.375 g 100 mL/hr over 30 Minutes Intravenous  Once 02/19/21 1809 02/19/21 1944        Assessment/Plan POD 4 s/p ex lap, sigmoid colectomy, end colostomy with enterorrhaphy and drainage of intraabdominal abscess - Dr. Kieth Brightly and Dr. Radene Knee 9/10 for Acute sigmoid diverticulitis with contained perforation and abscess  Still with ileus.  If no significant improvement by Friday, will get Ct.    -cont abx therapy - cont drain - Wound vac MWF.  - WOCN - Await bowel function and keep NGT to LIWS today - Cont  TPN - encouraged ambulation, use IS, multimodal pain control  - PT rec HH   FEN - NGT LIWS, NPO sips/chips, IVF, TPN VTE - heparin gtt ID - zosyn 9/4>>, doxycycline (orchitis). WBC down at 13.9 from 19.6. Afebrile.  Foley - out, ext   A fib AKI - resolved Hypokalemia HTN ETOH withdrawal   LOS: 10 days   Milus Height, MD FACS Surgical Oncology, General Surgery, Trauma and [place] [facility], [place] for weekday/non holidays Check [email or web address] for coverage night/weekend/holidays  Do not use SecureChat as it is not reliable for timely patient care.

## 2021-03-02 NOTE — Consult Note (Signed)
Urology Consult   Physician requesting consult: Dr. Candiss Norse  Reason for consult: Right epididymitis  History of Present Illness: Travis Conrad is a 70 y.o. admitted with a diverticular abscess s/p surgical intervention.  He was noted to have increased right scrotal pain and discomfort about 5 days ago.  A scrotal ultrasound demonstrated findings consistent with right epididymitis and a reactive complex hydrocele.  He denies scrotal pain or swelling prior to admission.  He has been on Zosyn since admission and was started on doxycycline a few days ago as well.  His pain is improved.  He continues to have similar swelling of the right hemiscrotum.  He denies a prior episode of epididymitis.   He denies a history of prior STIs.   Past Medical History:  Diagnosis Date   Atrial fibrillation ([place])    Dysrhythmia    a-fib   High cholesterol    "RX made groin hurt" (08/14/2016)   Hypertension     Past Surgical History:  Procedure Laterality Date   ANKLE FRACTURE SURGERY Left ~ 2012   "put rod and pins in"   ATRIAL FIBRILLATION ABLATION N/A 10/03/2019   Procedure: ATRIAL FIBRILLATION ABLATION;  Surgeon: Constance Haw, MD;  Location: [facility];  Service: Cardiovascular;  Laterality: N/A;   COLECTOMY N/A 02/26/2021   Procedure: PARTIAL COLECTOMY AND COLOSTOMY;  Surgeon: Kieth Brightly, Arta Bruce, MD;  Location: [facility];  Service: General;  Laterality: N/A;   COLONOSCOPY W/ BIOPSIES AND POLYPECTOMY  ~ 2000   FRACTURE SURGERY     LAPAROTOMY  02/26/2021   Procedure: EXPLORATORY LAPAROTOMY;  Surgeon: Mickeal Skinner, MD;  Location: [facility];  Service: General;;   ORIF FIBULA FRACTURE  06/24/2012   Procedure: OPEN REDUCTION INTERNAL FIXATION (ORIF) FIBULA FRACTURE;  Surgeon: [name], MD;  Location: [facility];  Service: Orthopedics;  Laterality: Left;  ORIF LEFT FIBULA,  POSSIBLE REPAIR OF SYNDESMOSIS    TEE WITHOUT CARDIOVERSION N/A 10/03/2019   Procedure: TRANSESOPHAGEAL  ECHOCARDIOGRAM (TEE);  Surgeon: Constance Haw, MD;  Location: [facility];  Service: Cardiovascular;  Laterality: N/A;    Medications:  Home meds:  No current facility-administered medications on file prior to encounter.   Current Outpatient Medications on File Prior to Encounter  Medication Sig Dispense Refill   calcium carbonate (TUMS - DOSED IN MG ELEMENTAL CALCIUM) 500 MG chewable tablet Chew 1 tablet by mouth 3 (three) times daily as needed for indigestion or heartburn.     diltiazem (DILT-XR) 240 MG 24 hr capsule Take 1 capsule by mouth daily with supper. Patient overdue for an appointment with Dr [name] and needs to call and schedule for further refills 1st attempt (Patient taking differently: Take 240 mg by mouth every evening.) 30 capsule 0   diphenhydrAMINE HCl, Sleep, (ZZZQUIL PO) Take 2 capsules by mouth at bedtime as needed (sleep).     linaCLOtide (LINZESS PO) Take 1 capsule by mouth daily.     losartan-hydrochlorothiazide (HYZAAR) 100-25 MG tablet Take 1 tablet by mouth daily. Please schedule overdue appointment for future refills. 3rd and final attempt. (Patient taking differently: Take 0.5 tablets by mouth daily.) 7 tablet 0   rosuvastatin (CRESTOR) 20 MG tablet TAKE 1 TABLET(20 MG) BY MOUTH DAILY (Patient taking differently: Take 20 mg by mouth daily.) 90 tablet 3   sotalol (BETAPACE) 120 MG tablet TAKE 1 TABLET(120 MG) BY MOUTH EVERY 12 HOURS (Patient taking differently: Take 120 mg by mouth 2 (two) times daily.) 180 tablet  3   XARELTO 20 MG TABS tablet TAKE 1 TABLET BY MOUTH EVERY DAY WITH DINNER (Patient taking differently: Take 20 mg by mouth at bedtime.) 30 tablet 5   COVID-19 mRNA vaccine, Pfizer, 30 MCG/0.3ML injection INJECT AS DIRECTED .3 mL 0   HYDROcodone-acetaminophen (NORCO) 5-325 MG tablet Take 1-2 tablets by mouth every 6 (six) hours as needed. (Patient not taking: No sig reported) 12 tablet 0     Scheduled Meds:  amLODipine  10 mg Per Tube Daily    Chlorhexidine Gluconate Cloth  6 each Topical Daily   cloNIDine  0.1 mg Per Tube BID   folic acid  1 mg Per Tube Daily   insulin aspart  0-15 Units Subcutaneous Q4H   lidocaine  1 patch Transdermal Q24H   melatonin  3 mg Per Tube QHS   metoprolol tartrate  100 mg Per Tube BID   nicotine  21 mg Transdermal Daily   pantoprazole (PROTONIX) IV  40 mg Intravenous Q24H   sodium chloride flush  10 mL Intracatheter Q12H   thiamine  100 mg Per Tube Daily   Or   thiamine  100 mg Intravenous Daily   Continuous Infusions:  sodium chloride Stopped (02/27/21 0414)   amiodarone 30 mg/hr (03/02/21 1438)   doxycycline (VIBRAMYCIN) IV 100 mg (03/02/21 1041)   heparin 1,600 Units/hr (03/02/21 1325)   methocarbamol (ROBAXIN) IV 1,000 mg (03/02/21 1319)   piperacillin-tazobactam (ZOSYN)  IV 3.375 g (03/02/21 1045)   TPN ADULT (ION) 85 mL/hr at 03/02/21 0322   TPN ADULT (ION)     PRN Meds:.sodium chloride, hydrALAZINE, menthol-cetylpyridinium, metoprolol tartrate, morphine injection, [DISCONTINUED] ondansetron **OR** ondansetron (ZOFRAN) IV, oxyCODONE, zolpidem  Allergies:  Allergies  Allergen Reactions   Atorvastatin Other (See Comments)    myalgias    Family History  Problem Relation Age of Onset   Other Brother     Social History:  reports that he has been smoking cigarettes. He has a 50.00 pack-year smoking history. He has never used smokeless tobacco. He reports that he does not currently use alcohol. He reports current drug use. Drugs: Cocaine and Marijuana.  ROS: A complete review of systems was performed.  All systems are negative except for pertinent findings as noted.  Physical Exam:  Vital signs in last 24 hours: Temp:  [97.8 F (36.6 C)-98.4 F (36.9 C)] 97.9 F (36.6 C) (09/14 1121) Pulse Rate:  [75-87] 81 (09/14 1300) Resp:  [16-18] 17 (09/14 1300) BP: (119-173)/(56-69) 135/59 (09/14 1300) SpO2:  [97 %-99 %] 98 % (09/14 1300) Constitutional:  Alert and oriented, No  acute distress Cardiovascular: No JVD Respiratory: Normal respiratory effort GI: Abdomen is soft, nontender, nondistended, no abdominal masses, NG tube in place Genitourinary: No CVAT. Normal male phallus, testes are descended bilaterally and without masses.  He does have mild erythema and moderate swelling of the right hemiscrotum.  The left scrotum in normal to appearance.  No scrotal fluctuance, induration, or drainage. Lymphatic: No lymphadenopathy Neurologic: Grossly intact, no focal deficits Psychiatric: Normal mood and affect  Laboratory Data:  Recent Labs    03/01/21 0441 03/01/21 1901 03/02/21 0659  WBC 13.9* 14.7* 13.5*  HGB 11.3* 11.2* 10.8*  HCT 33.3* 33.9* 32.3*  PLT 390 432* 409*    Recent Labs    02/28/21 0440 03/01/21 0441  NA 135 134*  K 4.0 4.2  CL 101 102  GLUCOSE 128* 147*  BUN 13 15  CALCIUM 8.3* 8.0*  CREATININE 0.97 0.82  Results for orders placed or performed during the hospital encounter of 02/19/21 (from the past 24 hour(s))  Glucose, capillary     Status: Abnormal   Collection Time: 03/01/21  4:05 PM  Result Value Ref Range   Glucose-Capillary 141 (H) 70 - 99 mg/dL  Heparin level (unfractionated)     Status: Abnormal   Collection Time: 03/01/21  5:04 PM  Result Value Ref Range   Heparin Unfractionated 0.82 (H) 0.30 - 0.70 IU/mL  CBC with Differential/Platelet     Status: Abnormal   Collection Time: 03/01/21  7:01 PM  Result Value Ref Range   WBC 14.7 (H) 4.0 - 10.5 K/uL   RBC 3.28 (L) 4.22 - 5.81 MIL/uL   Hemoglobin 11.2 (L) 13.0 - 17.0 g/dL   HCT 33.9 (L) 39.0 - 52.0 %   MCV 103.4 (H) 80.0 - 100.0 fL   MCH 34.1 (H) 26.0 - 34.0 pg   MCHC 33.0 30.0 - 36.0 g/dL   RDW 14.1 11.5 - 15.5 %   Platelets 432 (H) 150 - 400 K/uL   nRBC 0.0 0.0 - 0.2 %   Neutrophils Relative % 71 %   Neutro Abs 10.4 (H) 1.7 - 7.7 K/uL   Lymphocytes Relative 17 %   Lymphs Abs 2.5 0.7 - 4.0 K/uL   Monocytes Relative 9 %   Monocytes Absolute 1.3 (H) 0.1 -  1.0 K/uL   Eosinophils Relative 0 %   Eosinophils Absolute 0.0 0.0 - 0.5 K/uL   Basophils Relative 0 %   Basophils Absolute 0.1 0.0 - 0.1 K/uL   Immature Granulocytes 3 %   Abs Immature Granulocytes 0.43 (H) 0.00 - 0.07 K/uL  Glucose, capillary     Status: Abnormal   Collection Time: 03/01/21  8:53 PM  Result Value Ref Range   Glucose-Capillary 158 (H) 70 - 99 mg/dL  Glucose, capillary     Status: Abnormal   Collection Time: 03/01/21 11:03 PM  Result Value Ref Range   Glucose-Capillary 175 (H) 70 - 99 mg/dL  Glucose, capillary     Status: Abnormal   Collection Time: 03/02/21  3:21 AM  Result Value Ref Range   Glucose-Capillary 166 (H) 70 - 99 mg/dL  Glucose, capillary     Status: Abnormal   Collection Time: 03/02/21  4:03 AM  Result Value Ref Range   Glucose-Capillary 159 (H) 70 - 99 mg/dL  CBC     Status: Abnormal   Collection Time: 03/02/21  6:59 AM  Result Value Ref Range   WBC 13.5 (H) 4.0 - 10.5 K/uL   RBC 3.15 (L) 4.22 - 5.81 MIL/uL   Hemoglobin 10.8 (L) 13.0 - 17.0 g/dL   HCT 32.3 (L) 39.0 - 52.0 %   MCV 102.5 (H) 80.0 - 100.0 fL   MCH 34.3 (H) 26.0 - 34.0 pg   MCHC 33.4 30.0 - 36.0 g/dL   RDW 14.4 11.5 - 15.5 %   Platelets 409 (H) 150 - 400 K/uL   nRBC 0.0 0.0 - 0.2 %  Glucose, capillary     Status: Abnormal   Collection Time: 03/02/21  7:17 AM  Result Value Ref Range   Glucose-Capillary 171 (H) 70 - 99 mg/dL  Heparin level (unfractionated)     Status: Abnormal   Collection Time: 03/02/21  8:23 AM  Result Value Ref Range   Heparin Unfractionated 0.12 (L) 0.30 - 0.70 IU/mL  Glucose, capillary     Status: Abnormal   Collection Time: 03/02/21 11:23 AM  Result  Value Ref Range   Glucose-Capillary 168 (H) 70 - 99 mg/dL   Recent Results (from the past 240 hour(s))  Aerobic/Anaerobic Culture w Gram Stain (surgical/deep wound)     Status: None   Collection Time: 02/24/21  3:14 PM   Specimen: Abdomen; Abscess  Result Value Ref Range Status   Specimen Description  ABDOMEN  Final   Special Requests NONE  Final   Gram Stain   Final    FEW SQUAMOUS EPITHELIAL CELLS PRESENT ABUNDANT WBC PRESENT,BOTH PMN AND MONONUCLEAR FEW GRAM POSITIVE COCCI FEW GRAM POSITIVE RODS    Culture   Final    MODERATE BACTEROIDES FRAGILIS MODERATE BACTEROIDES THETAIOTAOMICRON BETA LACTAMASE POSITIVE Performed at [facility], [place] [address], [place], [place] [address]    Report Status 02/28/2021 FINAL  Final    Renal Function: Recent Labs    02/24/21 0356 02/25/21 0144 02/26/21 0135 02/27/21 0322 02/28/21 0440 03/01/21 0441  CREATININE 1.01 1.22 1.07 0.91 0.97 0.82   Estimated Creatinine Clearance: 92 mL/min (by C-G formula based on SCr of 0.82 mg/dL).  Radiologic Imaging: No results found.  I independently reviewed the above imaging studies.  Impression/Recommendation Right epididymitis: He appears to be responding appropriately to antibiotic therapy as his pain is improved.  He would need to complete a 2 week course of appropriate antibiotics.  Considering his age and history, this is not likely to be a STI.  Therefore, optimal treatment would likely be fluoroquinolone antibiotics.  Would recommend changing doxycycline to levofloxicin 500 mg daily for 2 weeks.  He should follow up after his hospital discharge for further evaluation to ensure resolution.  Please call if he appears to be clinically worsening.  His swelling, etc should may persist for a few weeks but his pain should continue to improve.  Dutch Gray 03/02/2021, 3:50 PM    Pryor Curia MD  CC: Dr. Lala Lund

## 2021-03-02 NOTE — TOC Progression Note (Signed)
Transition of Care [facility]) - Progression Note    Patient Details  Name: WAYLAN LYBBERT MRN: [id number] Date of Birth: Dec 11, 1950  [facility] CM/SW Contact  Joanne Chars, LCSW Phone Number: 03/02/2021, 10:58 AM  Clinical Narrative:   CSW spoke with pt wife Jimmi, she would like to choose [facility].  CSW spoke with Esmond Camper at [facility] who accepts referral.      Expected Discharge Plan: [place] Barriers to Discharge: Continued Medical Work up  Expected Discharge Plan and Services Expected Discharge Plan: [place] In-house Referral: Clinical Social Work   Post Acute Care Choice: [place] arrangements for the past 2 months: Single Family Home                                       Social Determinants of Health (SDOH) Interventions    Readmission Risk Interventions No flowsheet data found.

## 2021-03-03 ENCOUNTER — Inpatient Hospital Stay (HOSPITAL_COMMUNITY): Payer: Medicare Other

## 2021-03-03 DIAGNOSIS — K5720 Diverticulitis of large intestine with perforation and abscess without bleeding: Secondary | ICD-10-CM | POA: Diagnosis not present

## 2021-03-03 DIAGNOSIS — I482 Chronic atrial fibrillation, unspecified: Secondary | ICD-10-CM | POA: Diagnosis not present

## 2021-03-03 LAB — GLUCOSE, CAPILLARY
Glucose-Capillary: 167 mg/dL — ABNORMAL HIGH (ref 70–99)
Glucose-Capillary: 158 mg/dL — ABNORMAL HIGH (ref 70–99)
Glucose-Capillary: 137 mg/dL — ABNORMAL HIGH (ref 70–99)
Glucose-Capillary: 157 mg/dL — ABNORMAL HIGH (ref 70–99)
Glucose-Capillary: 143 mg/dL — ABNORMAL HIGH (ref 70–99)
Glucose-Capillary: 164 mg/dL — ABNORMAL HIGH (ref 70–99)
Glucose-Capillary: 148 mg/dL — ABNORMAL HIGH (ref 70–99)
Glucose-Capillary: 161 mg/dL — ABNORMAL HIGH (ref 70–99)

## 2021-03-03 LAB — COMPREHENSIVE METABOLIC PANEL
ALT: 11 U/L (ref 0–44)
AST: 15 U/L (ref 15–41)
Albumin: 1.6 g/dL — ABNORMAL LOW (ref 3.5–5.0)
Alkaline Phosphatase: 53 U/L (ref 38–126)
Anion gap: 7 (ref 5–15)
BUN: 16 mg/dL (ref 8–23)
CO2: 24 mmol/L (ref 22–32)
Calcium: 8.1 mg/dL — ABNORMAL LOW (ref 8.9–10.3)
Chloride: 101 mmol/L (ref 98–111)
Creatinine, Ser: 0.83 mg/dL (ref 0.61–1.24)
GFR, Estimated: >60 mL/min (ref >60)
Glucose, Bld: 178 mg/dL — ABNORMAL HIGH (ref 70–99)
Potassium: 4.7 mmol/L (ref 3.5–5.1)
Sodium: 132 mmol/L — ABNORMAL LOW (ref 135–145)
Total Bilirubin: 0.3 mg/dL (ref 0.3–1.2)
Total Protein: 4.7 g/dL — ABNORMAL LOW (ref 6.5–8.1)

## 2021-03-03 LAB — CBC
HCT: 28.2 % — ABNORMAL LOW (ref 39.0–52.0)
Hemoglobin: 9.3 g/dL — ABNORMAL LOW (ref 13.0–17.0)
MCH: 34.1 pg — ABNORMAL HIGH (ref 26.0–34.0)
MCHC: 33 g/dL (ref 30.0–36.0)
MCV: 103.3 fL — ABNORMAL HIGH (ref 80.0–100.0)
Platelets: 407 10*3/uL — ABNORMAL HIGH (ref 150–400)
RBC: 2.73 MIL/uL — ABNORMAL LOW (ref 4.22–5.81)
RDW: 14.3 % (ref 11.5–15.5)
WBC: 17.1 10*3/uL — ABNORMAL HIGH (ref 4.0–10.5)
nRBC: 0 % (ref 0.0–0.2)

## 2021-03-03 LAB — MAGNESIUM: Magnesium: 2.1 mg/dL (ref 1.7–2.4)

## 2021-03-03 LAB — HEPARIN LEVEL (UNFRACTIONATED): Heparin Unfractionated: 0.43 IU/mL (ref 0.30–0.70)

## 2021-03-03 LAB — PHOSPHORUS: Phosphorus: 3.6 mg/dL (ref 2.5–4.6)

## 2021-03-03 LAB — PROCALCITONIN: Procalcitonin: <0.1 ng/mL

## 2021-03-03 MED ORDER — TRAVASOL 10 % IV SOLN
INTRAVENOUS | Status: AC
  Filled 2021-03-03: qty 1020

## 2021-03-03 NOTE — Progress Notes (Signed)
ANTICOAGULATION CONSULT NOTE - Follow Up Consult  Pharmacy Consult for Heparin (while Xarelto on hold) Indication: atrial fibrillation  Allergies  Allergen Reactions   Atorvastatin Other (See Comments)    myalgias    Patient Measurements: Height: 6' (182.9 cm) Weight: 82.6 kg (182 lb) IBW/kg (Calculated) : 77.6 Heparin Dosing Weight: 82.6 kg  Vital Signs: Temp: 98 F (36.7 C) (09/15 0500) Temp Source: Oral (09/15 0500) BP: 134/66 (09/15 0718) Pulse Rate: 80 (09/15 0718)  Labs: Recent Labs    03/01/21 0441 03/01/21 1704 03/01/21 1901 03/02/21 0659 03/02/21 0823 03/03/21 0546 03/03/21 0549  HGB 11.3*  --  11.2* 10.8*  --  9.3*  --   HCT 33.3*  --  33.9* 32.3*  --  28.2*  --   PLT 390  --  432* 409*  --  407*  --   HEPARINUNFRC 0.89* 0.82*  --   --  0.12*  --  0.43  CREATININE 0.82  --   --   --   --  0.83  --      Estimated Creatinine Clearance: 90.9 mL/min (by C-G formula based on SCr of 0.83 mg/dL).  Assessment: 70 y/o male admitted with sepsis from contained perforated diverticulitis with active ileus. On PTA Xarelto for Afib with last dose on 9/2 at 2000.  On initially started on IV heparin; Xarelto held for possible need for surgery.  Pt continues on IV heparin while NPO.  Heparin level is therapeutic on 1600 units/hr.  Trace blood noted in colostomy output per surgery note.  Goal of Therapy:  Heparin level 0.3-0.5 units/ml (low end of therapeutic range) Monitor platelets by anticoagulation protocol: Yes   Plan:  Continue heparin drip at 1600 units/hr Daily heparin level and CBC. Monitor for bleeding. Xarelto remains on hold.  [employer], Pharm.D., BCPS Clinical Pharmacist Clinical phone for 03/03/2021 from 8:30-4:00 is [phone].  **Pharmacist phone directory can be found on [email or web address] listed under [place].  03/03/2021 9:36 AM

## 2021-03-03 NOTE — Progress Notes (Signed)
OT Cancellation Note  Patient Details Name: NIKKI JENSON MRN: [id number] DOB: [birth year]   Cancelled Treatment:     did not see in am secondary to high blood pressure.   Victoriano Campion 03/03/2021, 12:00 PM

## 2021-03-03 NOTE — Progress Notes (Signed)
Nutrition Follow-up  DOCUMENTATION CODES:  Not applicable  INTERVENTION:  -Continue TPN management per Pharmacy  NUTRITION DIAGNOSIS:  Inadequate oral intake related to acute illness (diverticulitis) as evidenced by per patient/family report, NPO status. -- ongoing  GOAL:  Patient will meet greater than or equal to 90% of their needs -- addressing with TPN  MONITOR:  I & O's, Weight trends, Labs  REASON FOR ASSESSMENT:  Consult New TPN/TNA  ASSESSMENT:  Pt with PMH significant for HTN, Afib, h/o diverticulitis, heavy EtOH use, and high cholesterol admitted with severe sepsis due to diverticulitis with contained perforation and small abscess  9/08 - s/p drainage catheter placement of pelvic abscess 9/09 - TPN initiated 9/10 - s/p ex lap, sigmoid colectomy, end colostomy creation (Hartmann's procedure) w/ enterorrhaphy and drainage of intra-abdominal abscess and woundVAC placement of 50cm area of abdomen  Pt unavailable at time of RD visit, working with PT. Per RN, pt endorses sore abdomen with little improvement from yesterday.   Continues to have ileus. Per surgery, plan for CT if no improvement by tomorrow.   Pt remains NPO and receiving TPN at goal rate of 59m/hr (provides 2104 kcals and 102 grams protein).   NGT remains to suction with 1.5L output x24 hours. Trace blood but no stool from ostomy.   UOP: 710 ml x24 hours Drain: 1118mx24 hours I/O: +13.5 L since admit  Mild pitting edema to BLE per RN edema assessment  Current weight (last updated 9/10): 82.6 kg Admit weight: 79.4 kg   Medications: folvite, SSI Q4H, protonix, thiamine Labs: Na 132 (L) CBGs 138-168 x 24 hours  Diet Order:   Diet Order             Diet NPO time specified Except for: Sips with Meds  Diet effective now                  EDUCATION NEEDS:  No education needs have been identified at this time  Skin:  Skin Assessment: Skin Integrity Issues: Skin Integrity Issues::  Incisions Incisions: closed incision to abdomen  Last BM:  9/6  Height:  Ht Readings from Last 1 Encounters:  02/26/21 6' (1.829 m)   Weight:  Wt Readings from Last 10 Encounters:  02/26/21 82.6 kg  06/28/20 79.4 kg  04/06/20 78.7 kg  01/05/20 78 kg  11/03/19 78.7 kg  10/03/19 79.4 kg  08/20/19 78.9 kg  07/22/19 79.1 kg  05/02/19 79.4 kg  04/02/18 80.3 kg   BMI:  Body mass index is 24.68 kg/m.  Estimated Nutritional Needs:  Kcal:  2000-2200 Protein:  100-110 grams Fluid:  >2L    AmLarkin InaMS, RD, LDN (she/her/hers) RD pager number and weekend/on-call pager number located in [facility]

## 2021-03-03 NOTE — Progress Notes (Addendum)
PROGRESS NOTE        PATIENT DETAILS Name: Travis Conrad Age: 70 y.o. Sex: male Date of Birth: [birth year] Admit Date: 02/19/2021 Admitting Physician Etta Quill, DO PCP:Sun, [name], MD  Brief Narrative: Patient is a 70 y.o. male A. fib, HTN, HLD, prior diverticulitis, heavy EtOH use-who presented with lower abdominal pain/nausea/vomiting-on initial presentation to the ED found to have severe sepsis physiology with hypotension/AKI-felt to be due to perforated diverticulitis.  See below for further details.  Significant events: 9/3>> presented with lower abdominal pain/vomiting-sepsis from contained perforated diverticulitis.  Found to have very active ileus/PSBO from diverticulitis/bowel inflammation 9/4>> A. fib with RVR-not responding to Lopressor/Cardizem-BP soft-started amiodarone infusion-cardiology consulted. 9/7>> increase in size of pelvic abscess on repeat CT. 9/8>> CT-guided drainage of pelvic abscess 9/9>> right testicular pain/swelling-scrotal ultrasound with Doppler pending.  Significant studies: 9/3>> CT abdomen/pelvis: Acute sigmoid diverticulitis-small extraluminal gas-fluid collection in the sigmoid mesocolon consistent with abscess.  Dilated proximal small bowel-likely ileus. 9/7>> CT abdomen/pelvis: Abscess has increased in size 9/7>> Echo: EF 60-65%.  Antimicrobial therapy: Zosyn: 9/3>>  Microbiology data: 9/3>> COVID/influenza PCR: Negative 9/3>> blood culture: Negative 9/8>> culture pelvic abscess: Pending.  Procedures : 9/8>> CT-guided placement of 12 French drainage catheter into pelvic collection. 9/10>> s/p ex lap, sigmoid colectomy, end colostomy with enterorrhaphy and drainage of intraabdominal abscess - Dr. Kieth Brightly and Dr. Radene Knee 9/10  Consults: General surgery Cardiology Interventional radiology Urology  DVT Prophylaxis : SCDs Start: 02/20/21 0002>> Heparin drip.   Subjective: Patient in bed, appears  comfortable, denies any headache, no fever, no chest pain or pressure, no shortness of breath , +ve post op abdominal pain. No new focal weakness.    Assessment/Plan:  Severe sepsis due to diverticulitis with contained perforation and small abscess: Sepsis physiology has resolved-although continues to have leukocytosis - remains on Zosyn.  Underwent CT- guided drainage of pelvic abscess on 02/25/21 followed by ex.Laprotomy, sigmoid colectomy, end colostomy with enterorrhaphy and drainage of intraabdominal abscess - Dr. Kieth Brightly and Dr. Radene Knee  on 02/26/2021, continue TNA, NG tube to intermittent suction, supportive care and monitor.  Will discontinue Zosyn once okay by general surgery.  He does have some intermittent oozing and bleeding from the colostomy site, general surgery is monitoring, H&H is stable.  Currently on heparin drip.   Ileus: Now postop ileus continue NG tube, see #1 above.    PAF with RVR: RVR provoked by sepsis/alcohol withdrawal-currently in sinus rhythm-remains on amiodarone infusion added PO Lopressor 02/27/21 .  On IV heparin >> to Xarelto once GI issues resolve .  Cardiology following.     Right scrotal pain and swelling due to epididymitis : Has been placed on doxycycline in addition to Zosyn, symptoms improving continue to monitor, denies any exposure or unprotected sex in the last few years, no unusual discharge.  Uro input appreciated, will change Doxy >> Levaquin once Zosyn is stopped, risk of C Diff is high,  total Rx 2 weeks - stop date 03/12/21.  AKI: Hemodynamically mediated-improved-creatinine has now normalized.  Hypokalemia/hypophosphatemia: Repleted and has normalized.  Continue to follow periodically.  Alcohol withdrawal: Awake and alert this morning-last drink was on 9/2, counseled to quit alcohol, no DTs, resolved.  HTN: Placed on oral beta-blocker, Norvasc added Catapres + PRN Hydralazine, pain control and monitor.  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   Diet: Diet Order             Diet NPO time specified Except for: Sips with Meds  Diet effective now                    Code Status:  Full code  Family Communication:  Spouse-Jimmi Ahner-[phone] updated 03/02/21  Disposition Plan:  Status is: Inpatient  Remains inpatient appropriate because:Inpatient level of care appropriate due to severity of illness  Dispo: The patient is from: Home              Anticipated d/c is to: Home              Patient currently is not medically stable to d/c.   Difficult to place patient No   Barriers to Discharge: Perforated diverticulitis with abscess-on IV antibiotics-developing alcohol withdrawal symptoms/PAF with RVR.  Antimicrobial agents: Anti-infectives (From admission, onward)    Start     Dose/Rate Route Frequency Ordered Stop   02/26/21 0100  doxycycline (VIBRAMYCIN) 100 mg in sodium chloride 0.9 % 250 mL IVPB        100 mg 125 mL/hr over 120 Minutes Intravenous 2 times daily 02/26/21 0005     02/20/21 0300  piperacillin-tazobactam (ZOSYN) IVPB 3.375 g        3.375 g 12.5 mL/hr over 240 Minutes Intravenous Every 8 hours 02/19/21 1907     02/19/21 1900  ceFEPIme (MAXIPIME) 2 g in sodium chloride 0.9 % 100 mL IVPB  Status:  Discontinued        2 g 200 mL/hr over 30 Minutes Intravenous  Once 02/19/21 1857 02/19/21 1906   02/19/21 1900  metroNIDAZOLE (FLAGYL) IVPB 500 mg  Status:  Discontinued        500 mg 100 mL/hr over 60 Minutes Intravenous  Once 02/19/21 1857 02/19/21 1906   02/19/21 1815  piperacillin-tazobactam (ZOSYN) IVPB 3.375 g        3.375 g 100 mL/hr over 30 Minutes Intravenous  Once 02/19/21 1809 02/19/21 1944        Time spent: 35 minutes-Greater than 50% of this time was spent in counseling, explanation of diagnosis, planning of further management, and coordination of care.  MEDICATIONS: Scheduled Meds:   amLODipine  10 mg Per Tube Daily   Chlorhexidine Gluconate Cloth  6 each Topical Daily   cloNIDine  0.1 mg Per Tube BID   folic acid  1 mg Per Tube Daily   insulin aspart  0-15 Units Subcutaneous Q4H   lidocaine  1 patch Transdermal Q24H   melatonin  3 mg Per Tube QHS   metoprolol tartrate  100 mg Per Tube BID   nicotine  21 mg Transdermal Daily   pantoprazole (PROTONIX) IV  40 mg Intravenous Q24H   sodium chloride flush  10 mL Intracatheter Q12H   thiamine  100 mg Per Tube Daily   Or   thiamine  100 mg Intravenous Daily   Continuous Infusions:  sodium chloride Stopped (02/27/21 0414)   amiodarone 30 mg/hr (03/03/21 0317)   doxycycline (VIBRAMYCIN) IV 100 mg (03/02/21 2308)   heparin 1,600 Units/hr (03/03/21 0537)   methocarbamol (ROBAXIN) IV 1,000 mg (03/03/21 0531)   piperacillin-tazobactam (ZOSYN)  IV 3.375 g (03/03/21 0324)   TPN ADULT (ION) 85 mL/hr at 03/02/21 1726   PRN Meds:.sodium chloride, hydrALAZINE, menthol-cetylpyridinium, metoprolol tartrate, morphine injection, [DISCONTINUED] ondansetron **OR** ondansetron (ZOFRAN) IV, oxyCODONE, zolpidem   PHYSICAL EXAM: Vital  signs: Vitals:   03/02/21 1928 03/02/21 2351 03/03/21 0500 03/03/21 0718  BP: (!) 153/59 (!) 133/58 (!) 146/62 134/66  Pulse: 90 76 80 80  Resp: '17 16 20 18  '$ Temp: 98.1 F (36.7 C) 98 F (36.7 C) 98 F (36.7 C)   TempSrc: Oral Oral Oral   SpO2: 98% 98% 99% 99%  Weight:      Height:       Filed Weights   02/19/21 1800 02/20/21 2000 02/26/21 0913  Weight: 79.4 kg 79.4 kg 82.6 kg   Body mass index is 24.68 kg/m.   Exam  Awake Alert, No new F.N deficits, NG in place, colostomy in place, scrotal swelling slowly improving [place].AT,PERRAL Supple Neck,No JVD, No cervical lymphadenopathy appriciated.  Symmetrical Chest wall movement, Good air movement bilaterally, CTAB RRR,No Gallops, Rubs or new Murmurs, No Parasternal Heave hypoactive B.Sounds, Abd Soft, mild tenderness,   No Cyanosis, Clubbing  or edema, No new Rash or bruise     I have personally reviewed following labs and imaging studies  LABORATORY DATA:  Recent Labs  Lab 02/27/21 0322 03/01/21 0441 03/01/21 1901 03/02/21 0659 03/03/21 0546  WBC 19.6* 13.9* 14.7* 13.5* 17.1*  HGB 12.4* 11.3* 11.2* 10.8* 9.3*  HCT 36.4* 33.3* 33.9* 32.3* 28.2*  PLT 355 390 432* 409* 407*  MCV 100.6* 101.8* 103.4* 102.5* 103.3*  MCH 34.3* 34.6* 34.1* 34.3* 34.1*  MCHC 34.1 33.9 33.0 33.4 33.0  RDW 13.2 14.2 14.1 14.4 14.3  LYMPHSABS  --   --  2.5  --   --   MONOABS  --   --  1.3*  --   --   EOSABS  --   --  0.0  --   --   BASOSABS  --   --  0.1  --   --     Recent Labs  Lab 02/25/21 0144 02/25/21 0957 02/26/21 0135 02/27/21 0322 02/28/21 0440 03/01/21 0441 03/03/21 0546  NA 135  --  134* 134* 135 134* 132*  K 4.0  --  3.9 4.1 4.0 4.2 4.7  CL 98  --  99 101 101 102 101  CO2 26  --  '28 26 26 24 24  '$ GLUCOSE 91  --  179* 191* 128* 147* 178*  BUN 11  --  '14 13 13 15 16  '$ CREATININE 1.22  --  1.07 0.91 0.97 0.82 0.83  CALCIUM 8.3*  --  8.2* 8.1* 8.3* 8.0* 8.1*  AST 12*  --   --   --  17  --  15  ALT 12  --   --   --  10  --  11  ALKPHOS 50  --   --   --  50  --  53  BILITOT 1.1  --   --   --  0.4  --  0.3  ALBUMIN 2.0*  --   --   --  1.7*  --  1.6*  MG 1.8  --  2.0 1.9 1.9 2.0 2.1  HGBA1C  --  5.9*  --   --   --   --   --      RADIOLOGY STUDIES/RESULTS: DG CHEST PORT 1 VIEW  Result Date: 03/03/2021 CLINICAL DATA:  Cough EXAM: PORTABLE CHEST 1 VIEW COMPARISON:  02/19/2021 FINDINGS: The heart size and mediastinal contours are within normal limits. Interval placement of right upper extremity PICC, tip positioned near the superior cavoatrial junction. Interval placement of esophagogastric tube, tip below the diaphragm,  side port above the gastroesophageal junction. Both lungs are clear. The visualized skeletal structures are unremarkable. IMPRESSION: 1. No acute abnormality of the lungs in AP portable projection. 2.  Interval placement of right upper extremity PICC, tip positioned near the superior cavoatrial junction. 3. Interval placement of esophagogastric tube, tip below the diaphragm, side port above the gastroesophageal junction. Recommend advancement. Electronically Signed   By: Eddie Candle M.D.   On: 03/03/2021 08:28     LOS: 11 days   Signature  Lala Lund M.D on 03/03/2021 at 8:43 AM   -  To page go to [email or web address]    03/03/2021, 8:43 AM

## 2021-03-03 NOTE — Progress Notes (Signed)
Unable to get control of pts pain. Can we look at his pain regime. Pt received multiple doses of pain meds.

## 2021-03-03 NOTE — Progress Notes (Signed)
Physical Therapy Treatment Patient Details Name: Travis Conrad MRN: [id number] DOB: [birth year] Today's Date: 03/03/2021   History of Present Illness 70 yo admitted 9/3 with abdominal pain, vomiting and ileus. 9/4 Afib with RVR. 9/8 intrabdominal abscess s/p IR drain placed. 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 PMhx: HTN, HLD, Afib, ETOH use    PT Comments    Pt cognitive status improved compared to previous session with improved recall of bed mobility technique. Pt reported dizziness after ambulation with SBP in mid 180s (see below). Demonstrated increased ambulation distance with similar pain rating compared to previous session. Will continue to follow acutely and progress ambulation distance. Continue to recommend HHPT given pt's progress and family support.   BP after ambulation - 182/97 (124)   Recommendations for follow up therapy are one component of a multi-disciplinary discharge planning process, led by the attending physician.  Recommendations may be updated based on patient status, additional functional criteria and insurance authorization.  Follow Up Recommendations  Home health PT;Supervision for mobility/OOB     Equipment Recommendations  3in1 (PT)    Recommendations for Other Services       Precautions / Restrictions Precautions Precautions: Fall;Other (comment) Precaution Comments: wound vAC, jp drain, NGtube     Mobility  Bed Mobility Overal bed mobility: Needs Assistance Bed Mobility: Rolling;Sidelying to Sit Rolling: Min assist Sidelying to sit: Min assist       General bed mobility comments: cues for sequence with assist to roll and rise from surface with multimodal cues and increased time, decreased physical assist    Transfers Overall transfer level: Needs assistance   Transfers: Sit to/from Stand Sit to Stand: Min assist         General transfer comment: cues for hand placement,  sequence and safety  Ambulation/Gait Ambulation/Gait assistance: Min guard Gait Distance (Feet): 130 Feet Assistive device: Rolling walker (2 wheeled) Gait Pattern/deviations: Trunk flexed;Decreased stride length;Step-through pattern   Gait velocity interpretation: 1.31 - 2.62 ft/sec, indicative of limited community ambulator General Gait Details: pt with maintained hip and trunk flexion due to pain with cues for posture and position in RW. Pt able to self regulate activity tolerance Pt with increased [name] pain and weakness today   Stairs             Wheelchair Mobility    Modified Rankin (Stroke Patients Only)       Balance Overall balance assessment: Needs assistance Sitting-balance support: No upper extremity supported Sitting balance-Leahy Scale: Fair Sitting balance - Comments: able to sit without UE support   Standing balance support: Bilateral upper extremity supported Standing balance-Leahy Scale: Poor Standing balance comment: RW use for gait                            Cognition Arousal/Alertness: Awake/alert Behavior During Therapy: WFL for tasks assessed/performed Overall Cognitive Status: Impaired/Different from baseline Area of Impairment: Memory                     Memory: Decreased short-term memory         General Comments: HOH      Exercises General Exercises - Lower Extremity Long Arc Quad: AROM;Both;Seated;10 reps Hip Flexion/Marching: AROM;Both;10 reps;Standing    General Comments        Pertinent Vitals/Pain Pain Score: 7  Pain Location: abdominal incision Pain Descriptors / Indicators: Aching;Guarding Pain Intervention(s): Limited activity within patient's  tolerance;Monitored during session;Premedicated before session;Repositioned    Home Living                      Prior Function            PT Goals (current goals can now be found in the care plan section)      Frequency    Min  3X/week      PT Plan Current plan remains appropriate    Co-evaluation              AM-PAC PT "6 Clicks" Mobility   Outcome Measure  Help needed turning from your back to your side while in a flat bed without using bedrails?: A Little Help needed moving from lying on your back to sitting on the side of a flat bed without using bedrails?: A Little Help needed moving to and from a bed to a chair (including a wheelchair)?: A Little Help needed standing up from a chair using your arms (e.g., wheelchair or bedside chair)?: A Little Help needed to walk in hospital room?: A Little Help needed climbing 3-5 steps with a railing? : A Lot 6 Click Score: 17    End of Session   Activity Tolerance: Patient tolerated treatment well Patient left: in chair;with call bell/phone within reach;with chair alarm set;with family/visitor present Nurse Communication: Mobility status PT Visit Diagnosis: Other abnormalities of gait and mobility (R26.89);Difficulty in walking, not elsewhere classified (R26.2)     Time: [id number] PT Time Calculation (min) (ACUTE ONLY): 37 min  Charges:  $Gait Training: 8-22 mins $Therapeutic Activity: 8-22 mins                     Louie Casa, SPT Acute Rehab: [phone]    Domingo Dimes 03/03/2021, 12:14 PM

## 2021-03-03 NOTE — Plan of Care (Signed)
  Problem: Health Behavior/Discharge Planning: Goal: Ability to manage health-related needs will improve Outcome: Progressing   Problem: Clinical Measurements: Goal: Ability to maintain clinical measurements within normal limits will improve Outcome: Progressing Goal: Will remain free from infection Outcome: Progressing Goal: Diagnostic test results will improve Outcome: Progressing Goal: Respiratory complications will improve Outcome: Progressing Goal: Cardiovascular complication will be avoided Outcome: Progressing   Problem: Activity: Goal: Risk for activity intolerance will decrease Outcome: Progressing   Problem: Nutrition: Goal: Adequate nutrition will be maintained Outcome: Progressing   Problem: Coping: Goal: Level of anxiety will decrease Outcome: Progressing   Problem: Elimination: Goal: Will not experience complications related to bowel motility Outcome: Progressing Goal: Will not experience complications related to urinary retention Outcome: Progressing   Problem: Pain Managment: Goal: General experience of comfort will improve Outcome: Progressing   Problem: Safety: Goal: Ability to remain free from injury will improve Outcome: Progressing   Problem: Skin Integrity: Goal: Risk for impaired skin integrity will decrease Outcome: Progressing   Problem: Education: Goal: Knowledge of disease or condition will improve Outcome: Progressing Goal: Understanding of medication regimen will improve Outcome: Progressing Goal: Individualized Educational Video(s) Outcome: Progressing   Problem: Activity: Goal: Ability to tolerate increased activity will improve Outcome: Progressing   Problem: Cardiac: Goal: Ability to achieve and maintain adequate cardiopulmonary perfusion will improve Outcome: Progressing   Problem: Health Behavior/Discharge Planning: Goal: Ability to safely manage health-related needs after discharge will improve Outcome: Progressing    Problem: Education: Goal: Required Educational Video(s) Outcome: Progressing   Problem: Clinical Measurements: Goal: Ability to maintain clinical measurements within normal limits will improve Outcome: Progressing Goal: Postoperative complications will be avoided or minimized Outcome: Progressing   Problem: Skin Integrity: Goal: Demonstration of wound healing without infection will improve Outcome: Progressing

## 2021-03-03 NOTE — Progress Notes (Signed)
PHARMACY - TOTAL PARENTERAL NUTRITION CONSULT NOTE   Indication: Prolonged ileus  Patient Measurements: Height: 6' (182.9 cm) Weight: 82.6 kg (182 lb) IBW/kg (Calculated) : 77.6 TPN AdjBW (KG): 82.6 Body mass index is 24.68 kg/m.  Assessment: 69 YOM with lower abdominal pain/N/V found to have perforated diverticulitis. CT abd on 9/3 found to have active ileus/pSBO from diverticulitis/bowel inflammation. Pharmacy consulted to start TPN for prolonged ileus. Of note patient has a h/o alcohol dependency and is at high risk for refeeding.   Glucose / Insulin: no hx DM, CBGs 130-170s. 16 units mSSI utilized in last 24hrs Electrolytes: Na 132, K up to 4.7 (goal >/=4 with ileus), Mg 2.1 (goal >/=2 with ileus), others WNL Renal: SCr <1 stable, BUN WNL Hepatic: LFTs / Tbili / TG wnl, Albumin 1.6 Intake / Output; MIVF: NG 721m, Drain output 739m UOP 0.41m26mg/hr  GI Imaging: 9/3 CT abd: Active ileus/PSBO GI Surgeries / Procedures: 9/10 ex-lap, sigmoid colectomy w/ end colostomy creation, enterorrhaphy, drainage of intra-abd abscess, wound vac placement  Central access: PICC line 9/8 >>  TPN start date: 9/9>>   Nutritional Goals: Goal TPN rate is 85 mL/hr (provides 102 g of protein and 2104 kcals per day)  RD Assessment: Estimated Needs Total Energy Estimated Needs: 2000-2200 Total Protein Estimated Needs: 100-110 grams Total Fluid Estimated Needs: >2L  Current Nutrition:  TPN; NPO  Plan:  Continue TPN at goal rate 4m42m at 1800 Electrolytes in TPN: increase Na to 110mE69m reduce K to 45mEq41mCa 5mEq/L9mg 9mEq/L,1md Phos 20mmol/L53m:Ac 1:1 Add standard MVI and trace elements to TPN Continue Moderate q4h SSI and adjust as needed Monitor standard TPN labs Mon/Thurs and PRN F/u Surgery plans - planning repeat CT Friday if still no significant improvement with ileus   Travis Conrad vonArturo Morton BCPS Please check AMION for all [facility] Clinical  Pharmacist 03/03/2021 8:23 AM

## 2021-03-03 NOTE — Progress Notes (Signed)
Pharmacy Antibiotic Note  Travis Conrad is a 70 y.o. male admitted on 02/19/2021 with diverticulitis and abscess.  Patient continues on Zosyn (abx day #12).  Patient has pSBO/ileus and continues on TPN.  Noted plans for repeat CT scan 9/16 to evaluate pelvic abscess.  Patient is afebrile, but noted increase in WBC 13.5 > 17.1.  Renal fxn stable with SCr <1.  Plan: Continue Zosyn 3.375g IV q8h (4 hour infusion).  Height: 6' (182.9 cm) Weight: 82.6 kg (182 lb) IBW/kg (Calculated) : 77.6  Temp (24hrs), Avg:98 F (36.7 C), Min:97.9 F (36.6 C), Max:98.1 F (36.7 C)  Recent Labs  Lab 02/26/21 0135 02/27/21 0322 02/28/21 0440 03/01/21 0441 03/01/21 1901 03/02/21 0659 03/03/21 0546  WBC 16.6* 19.6*  --  13.9* 14.7* 13.5* 17.1*  CREATININE 1.07 0.91 0.97 0.82  --   --  0.83    Estimated Creatinine Clearance: 90.9 mL/min (by C-G formula based on SCr of 0.83 mg/dL).    Allergies  Allergen Reactions   Atorvastatin Other (See Comments)    myalgias    Antimicrobials this admission: Zosyn 9/3 ~7pm >> Doxycycline 9/9 >>  Dose adjustments this admission:   Microbiology results: 9/3 BCx x 2: Negative 9/3 covid: neg; flu: neg 9/8 pelvic ascess cx: mod Bacteroides, 2 species (frag and thetaiotaomicron), beta-lactamase positive, final  Thank you for allowing pharmacy to be a part of this patient's care.  [employer], Pharm.D., BCPS Clinical Pharmacist Clinical phone for 03/03/2021 from 8:30-4:00 is [phone].  **Pharmacist phone directory can be found on [email or web address] listed under [place].  03/03/2021 9:43 AM

## 2021-03-03 NOTE — Progress Notes (Signed)
[facility] Progress Note  5 Days Post-Op  Subjective: CC-  Abdomen sore and feels about the same as yesterday. States that the pain radiates into his testicles. NG with 1.5L out. Trace blood but no stool from ostomy. Heparin has been restarted.  Objective: Vital signs in last 24 hours: Temp:  [97.9 F (36.6 C)-98.1 F (36.7 C)] 98 F (36.7 C) (09/15 0500) Pulse Rate:  [76-90] 80 (09/15 0718) Resp:  [16-20] 18 (09/15 0718) BP: (133-155)/(58-71) 134/66 (09/15 0718) SpO2:  [98 %-99 %] 99 % (09/15 0718) Last BM Date: 02/22/21  Intake/Output from previous day: 09/14 0701 - 09/15 0700 In: 4032.6 [I.V.:2768.4; IV Piggyback:1194.2] Out: 2320 [Urine:710; Emesis/NG output:1500; Drains:110] Intake/Output this shift: Total I/O In: -  Out: 1600 [Urine:1600]  PE: Gen:  Alert, NAD, pleasant Pulm:  Normal rate and effort  Abd: Soft, mild distension, mild diffuse TTP without rebound or guarding.  Drain w/ SS output. Colostomy small amount of dark bloody drainage. NGT w/ bilious output. vac to midline incision with good seal Skin: no rashes noted, warm and dry  Lab Results:  Recent Labs    03/02/21 0659 03/03/21 0546  WBC 13.5* 17.1*  HGB 10.8* 9.3*  HCT 32.3* 28.2*  PLT 409* 407*   BMET Recent Labs    03/01/21 0441 03/03/21 0546  NA 134* 132*  K 4.2 4.7  CL 102 101  CO2 24 24  GLUCOSE 147* 178*  BUN 15 16  CREATININE 0.82 0.83  CALCIUM 8.0* 8.1*   PT/INR No results for input(s): LABPROT, INR in the last 72 hours. CMP     Component Value Date/Time   NA 132 (L) 03/03/2021 0546   NA 134 09/30/2019 0959   NA 142 03/03/2016 0921   K 4.7 03/03/2021 0546   K 4.3 03/03/2016 0921   CL 101 03/03/2021 0546   CO2 24 03/03/2021 0546   CO2 25 03/03/2016 0921   GLUCOSE 178 (H) 03/03/2021 0546   GLUCOSE 89 03/03/2016 0921   BUN 16 03/03/2021 0546   BUN 11 09/30/2019 0959   BUN 13.6 03/03/2016 0921   CREATININE 0.83 03/03/2021 0546   CREATININE 1.2 03/03/2016  0921   CALCIUM 8.1 (L) 03/03/2021 0546   CALCIUM 9.6 03/03/2016 0921   PROT 4.7 (L) 03/03/2021 0546   PROT 7.3 03/03/2016 0921   ALBUMIN 1.6 (L) 03/03/2021 0546   ALBUMIN 3.8 03/03/2016 0921   AST 15 03/03/2021 0546   AST 36 (H) 03/03/2016 0921   ALT 11 03/03/2021 0546   ALT 31 03/03/2016 0921   ALKPHOS 53 03/03/2021 0546   ALKPHOS 116 03/03/2016 0921   BILITOT 0.3 03/03/2021 0546   BILITOT 0.71 03/03/2016 0921   GFRNONAA >60 03/03/2021 0546   GFRNONAA 67 07/25/2013 1712   GFRAA >60 10/03/2019 0930   GFRAA 77 07/25/2013 1712   Lipase     Component Value Date/Time   LIPASE 22 02/19/2021 1653       Studies/Results: DG CHEST PORT 1 VIEW  Result Date: 03/03/2021 CLINICAL DATA:  Cough EXAM: PORTABLE CHEST 1 VIEW COMPARISON:  02/19/2021 FINDINGS: The heart size and mediastinal contours are within normal limits. Interval placement of right upper extremity PICC, tip positioned near the superior cavoatrial junction. Interval placement of esophagogastric tube, tip below the diaphragm, side port above the gastroesophageal junction. Both lungs are clear. The visualized skeletal structures are unremarkable. IMPRESSION: 1. No acute abnormality of the lungs in AP portable projection. 2. Interval placement of right upper extremity PICC,  tip positioned near the superior cavoatrial junction. 3. Interval placement of esophagogastric tube, tip below the diaphragm, side port above the gastroesophageal junction. Recommend advancement. Electronically Signed   By: Eddie Candle M.D.   On: 03/03/2021 08:28    Anti-infectives: Anti-infectives (From admission, onward)    Start     Dose/Rate Route Frequency Ordered Stop   02/26/21 0100  doxycycline (VIBRAMYCIN) 100 mg in sodium chloride 0.9 % 250 mL IVPB        100 mg 125 mL/hr over 120 Minutes Intravenous 2 times daily 02/26/21 0005     02/20/21 0300  piperacillin-tazobactam (ZOSYN) IVPB 3.375 g        3.375 g 12.5 mL/hr over 240 Minutes Intravenous  Every 8 hours 02/19/21 1907     02/19/21 1900  ceFEPIme (MAXIPIME) 2 g in sodium chloride 0.9 % 100 mL IVPB  Status:  Discontinued        2 g 200 mL/hr over 30 Minutes Intravenous  Once 02/19/21 1857 02/19/21 1906   02/19/21 1900  metroNIDAZOLE (FLAGYL) IVPB 500 mg  Status:  Discontinued        500 mg 100 mL/hr over 60 Minutes Intravenous  Once 02/19/21 1857 02/19/21 1906   02/19/21 1815  piperacillin-tazobactam (ZOSYN) IVPB 3.375 g        3.375 g 100 mL/hr over 30 Minutes Intravenous  Once 02/19/21 1809 02/19/21 1944        Assessment/Plan POD 5 s/p ex lap, sigmoid colectomy, end colostomy with enterorrhaphy and drainage of intraabdominal abscess - Dr. Kieth Brightly and Dr. Radene Knee 9/10 for Acute sigmoid diverticulitis with contained perforation and abscess   Still with ileus.  If no significant improvement by tomorrow then will plan to get CT scan   -cont abx therapy - cont drain - Wound vac MWF.  - WOCN - Await bowel function and keep NGT to LIWS today - Cont TPN - encouraged ambulation, use IS, multimodal pain control  - PT rec HH   FEN - NGT LIWS, NPO sips/chips, IVF, TPN VTE - heparin gtt ID - zosyn 9/4>>, doxycycline (orchitis). WBC up at 17.1, afebrile Foley - out   A fib AKI - resolved ABL anemia - Hgb 9.3 from 10.8, no hypotension or tachycardia, monitor HTN ETOH withdrawal   LOS: 11 days    Wellington Hampshire, [facility] Surgery 03/03/2021, 8:40 AM Please see Amion for pager number during day hours 7:00am-4:30pm

## 2021-03-03 NOTE — Progress Notes (Signed)
Progress Note  Patient Name: Travis Conrad Date of Encounter: 03/03/2021  Primary Cardiologist:   [name], MD   Subjective   He denies SOB or chest pain.  He has some abdominal pain.    Inpatient Medications    Scheduled Meds:  amLODipine  10 mg Per Tube Daily   Chlorhexidine Gluconate Cloth  6 each Topical Daily   cloNIDine  0.1 mg Per Tube BID   folic acid  1 mg Per Tube Daily   insulin aspart  0-15 Units Subcutaneous Q4H   lidocaine  1 patch Transdermal Q24H   melatonin  3 mg Per Tube QHS   metoprolol tartrate  100 mg Per Tube BID   nicotine  21 mg Transdermal Daily   pantoprazole (PROTONIX) IV  40 mg Intravenous Q24H   sodium chloride flush  10 mL Intracatheter Q12H   thiamine  100 mg Per Tube Daily   Or   thiamine  100 mg Intravenous Daily   Continuous Infusions:  sodium chloride Stopped (02/27/21 0414)   amiodarone 30 mg/hr (03/03/21 0317)   doxycycline (VIBRAMYCIN) IV 100 mg (03/02/21 2308)   heparin 1,600 Units/hr (03/03/21 0537)   methocarbamol (ROBAXIN) IV 1,000 mg (03/03/21 0531)   piperacillin-tazobactam (ZOSYN)  IV 3.375 g (03/03/21 0324)   TPN ADULT (ION) 85 mL/hr at 03/02/21 1726   PRN Meds: sodium chloride, hydrALAZINE, menthol-cetylpyridinium, metoprolol tartrate, morphine injection, [DISCONTINUED] ondansetron **OR** ondansetron (ZOFRAN) IV, oxyCODONE, zolpidem   Vital Signs    Vitals:   03/02/21 1928 03/02/21 2351 03/03/21 0500 03/03/21 0718  BP: (!) 153/59 (!) 133/58 (!) 146/62 134/66  Pulse: 90 76 80 80  Resp: '17 16 20 18  '$ Temp: 98.1 F (36.7 C) 98 F (36.7 C) 98 F (36.7 C)   TempSrc: Oral Oral Oral   SpO2: 98% 98% 99% 99%  Weight:      Height:        Intake/Output Summary (Last 24 hours) at 03/03/2021 0847 Last data filed at 03/03/2021 0747 Gross per 24 hour  Intake 4032.59 ml  Output 3890 ml  Net 142.59 ml   Filed Weights   02/19/21 1800 02/20/21 2000 02/26/21 0913  Weight: 79.4 kg 79.4 kg 82.6 kg    Telemetry     NSR - Personally Reviewed  ECG    NA - Personally Reviewed  Physical Exam   GEN: No acute distress.   Neck: No  JVD Cardiac: RRR, no murmurs, rubs, or gallops.  Respiratory: Clear  to auscultation bilaterally. GI: Soft, mildly tender, distended  MS: Mild diffuse edema; No deformity. Neuro:  Nonfocal  Psych: Normal affect   Labs    Chemistry Recent Labs  Lab 02/25/21 0144 02/26/21 0135 02/28/21 0440 03/01/21 0441 03/03/21 0546  NA 135   < > 135 134* 132*  K 4.0   < > 4.0 4.2 4.7  CL 98   < > 101 102 101  CO2 26   < > '26 24 24  '$ GLUCOSE 91   < > 128* 147* 178*  BUN 11   < > '13 15 16  '$ CREATININE 1.22   < > 0.97 0.82 0.83  CALCIUM 8.3*   < > 8.3* 8.0* 8.1*  PROT 5.3*  --  5.0*  --  4.7*  ALBUMIN 2.0*  --  1.7*  --  1.6*  AST 12*  --  17  --  15  ALT 12  --  10  --  11  ALKPHOS 50  --  50  --  53  BILITOT 1.1  --  0.4  --  0.3  GFRNONAA >60   < > >60 >60 >60  ANIONGAP 11   < > '8 8 7   '$ < > = values in this interval not displayed.     Hematology Recent Labs  Lab 03/01/21 1901 03/02/21 0659 03/03/21 0546  WBC 14.7* 13.5* 17.1*  RBC 3.28* 3.15* 2.73*  HGB 11.2* 10.8* 9.3*  HCT 33.9* 32.3* 28.2*  MCV 103.4* 102.5* 103.3*  MCH 34.1* 34.3* 34.1*  MCHC 33.0 33.4 33.0  RDW 14.1 14.4 14.3  PLT 432* 409* 407*    Cardiac EnzymesNo results for input(s): TROPONINI in the last 168 hours. No results for input(s): TROPIPOC in the last 168 hours.   BNPNo results for input(s): BNP, PROBNP in the last 168 hours.   DDimer No results for input(s): DDIMER in the last 168 hours.   Radiology    DG CHEST PORT 1 VIEW  Result Date: 03/03/2021 CLINICAL DATA:  Cough EXAM: PORTABLE CHEST 1 VIEW COMPARISON:  02/19/2021 FINDINGS: The heart size and mediastinal contours are within normal limits. Interval placement of right upper extremity PICC, tip positioned near the superior cavoatrial junction. Interval placement of esophagogastric tube, tip below the diaphragm, side port above  the gastroesophageal junction. Both lungs are clear. The visualized skeletal structures are unremarkable. IMPRESSION: 1. No acute abnormality of the lungs in AP portable projection. 2. Interval placement of right upper extremity PICC, tip positioned near the superior cavoatrial junction. 3. Interval placement of esophagogastric tube, tip below the diaphragm, side port above the gastroesophageal junction. Recommend advancement. Electronically Signed   By: Eddie Candle M.D.   On: 03/03/2021 08:28    Cardiac Studies   ECHO   1. Left ventricular ejection fraction, by estimation, is 60 to 65%. The  left ventricle has normal function. The left ventricle has no regional  wall motion abnormalities. There is moderate left ventricular hypertrophy  of the basal-septal segment. Left  ventricular diastolic parameters were normal.   2. Right ventricular systolic function is normal. The right ventricular  size is normal. Tricuspid regurgitation signal is inadequate for assessing  PA pressure.   3. The mitral valve is normal in structure. Trivial mitral valve  regurgitation. No evidence of mitral stenosis.   4. The aortic valve is normal in structure. Aortic valve regurgitation is  not visualized. No aortic stenosis is present.   Patient Profile     70 y.o. male with paroxysmal atrial fibrillation RVR in the setting of severe sepsis  Assessment & Plan    PAF:  No POs yet.  Maintaining NSR on IV amiodarone.  On heparin.  We will follow as needed to guide the transition to PO meds when able.    For questions or updates, please contact [facility] Please consult [email or web address] for contact info under Cardiology/STEMI.   Signed, Minus Breeding, MD  03/03/2021, 8:47 AM

## 2021-03-04 ENCOUNTER — Inpatient Hospital Stay (HOSPITAL_COMMUNITY): Payer: Medicare Other

## 2021-03-04 DIAGNOSIS — I482 Chronic atrial fibrillation, unspecified: Secondary | ICD-10-CM | POA: Diagnosis not present

## 2021-03-04 DIAGNOSIS — K5792 Diverticulitis of intestine, part unspecified, without perforation or abscess without bleeding: Secondary | ICD-10-CM | POA: Diagnosis not present

## 2021-03-04 DIAGNOSIS — K651 Peritoneal abscess: Secondary | ICD-10-CM | POA: Diagnosis not present

## 2021-03-04 DIAGNOSIS — N451 Epididymitis: Secondary | ICD-10-CM

## 2021-03-04 DIAGNOSIS — K5720 Diverticulitis of large intestine with perforation and abscess without bleeding: Secondary | ICD-10-CM | POA: Diagnosis not present

## 2021-03-04 LAB — CBC
HCT: 24.3 % — ABNORMAL LOW (ref 39.0–52.0)
HCT: 25.5 % — ABNORMAL LOW (ref 39.0–52.0)
Hemoglobin: 7.8 g/dL — ABNORMAL LOW (ref 13.0–17.0)
Hemoglobin: 8.7 g/dL — ABNORMAL LOW (ref 13.0–17.0)
MCH: 35.9 pg — ABNORMAL HIGH (ref 26.0–34.0)
MCH: 35.1 pg — ABNORMAL HIGH (ref 26.0–34.0)
MCHC: 32.1 g/dL (ref 30.0–36.0)
MCHC: 34.1 g/dL (ref 30.0–36.0)
MCV: 112 fL — ABNORMAL HIGH (ref 80.0–100.0)
MCV: 102.8 fL — ABNORMAL HIGH (ref 80.0–100.0)
Platelets: 356 10*3/uL (ref 150–400)
Platelets: 383 10*3/uL (ref 150–400)
RBC: 2.17 MIL/uL — ABNORMAL LOW (ref 4.22–5.81)
RBC: 2.48 MIL/uL — ABNORMAL LOW (ref 4.22–5.81)
RDW: 15.5 % (ref 11.5–15.5)
RDW: 14.3 % (ref 11.5–15.5)
WBC: 15.5 10*3/uL — ABNORMAL HIGH (ref 4.0–10.5)
WBC: 19.1 10*3/uL — ABNORMAL HIGH (ref 4.0–10.5)
nRBC: 0 % (ref 0.0–0.2)
nRBC: 0 % (ref 0.0–0.2)

## 2021-03-04 LAB — COMPREHENSIVE METABOLIC PANEL
ALT: 9 U/L (ref 0–44)
AST: 12 U/L — ABNORMAL LOW (ref 15–41)
Albumin: 1.6 g/dL — ABNORMAL LOW (ref 3.5–5.0)
Alkaline Phosphatase: 60 U/L (ref 38–126)
Anion gap: 7 (ref 5–15)
BUN: 17 mg/dL (ref 8–23)
CO2: 23 mmol/L (ref 22–32)
Calcium: 7.9 mg/dL — ABNORMAL LOW (ref 8.9–10.3)
Chloride: 101 mmol/L (ref 98–111)
Creatinine, Ser: 0.86 mg/dL (ref 0.61–1.24)
GFR, Estimated: >60 mL/min (ref >60)
Glucose, Bld: 111 mg/dL — ABNORMAL HIGH (ref 70–99)
Potassium: 4.9 mmol/L (ref 3.5–5.1)
Sodium: 131 mmol/L — ABNORMAL LOW (ref 135–145)
Total Bilirubin: 0.5 mg/dL (ref 0.3–1.2)
Total Protein: 4.8 g/dL — ABNORMAL LOW (ref 6.5–8.1)

## 2021-03-04 LAB — URINALYSIS, ROUTINE W REFLEX MICROSCOPIC
Bilirubin Urine: NEGATIVE
Glucose, UA: NEGATIVE mg/dL
Hgb urine dipstick: NEGATIVE
Ketones, ur: NEGATIVE mg/dL
Leukocytes,Ua: NEGATIVE
Nitrite: NEGATIVE
Protein, ur: NEGATIVE mg/dL
Specific Gravity, Urine: 1.02 (ref 1.005–1.030)
pH: 5 (ref 5.0–8.0)

## 2021-03-04 LAB — HEPARIN LEVEL (UNFRACTIONATED)
Heparin Unfractionated: >1.1 IU/mL — ABNORMAL HIGH (ref 0.30–0.70)
Heparin Unfractionated: <0.1 IU/mL — ABNORMAL LOW (ref 0.30–0.70)
Heparin Unfractionated: 0.16 IU/mL — ABNORMAL LOW (ref 0.30–0.70)

## 2021-03-04 LAB — PROCALCITONIN
Procalcitonin: 41.97 ng/mL
Procalcitonin: <0.1 ng/mL

## 2021-03-04 LAB — OSMOLALITY, URINE: Osmolality, Ur: 619 mOsm/kg (ref 300–900)

## 2021-03-04 LAB — GLUCOSE, CAPILLARY
Glucose-Capillary: 140 mg/dL — ABNORMAL HIGH (ref 70–99)
Glucose-Capillary: 143 mg/dL — ABNORMAL HIGH (ref 70–99)
Glucose-Capillary: 135 mg/dL — ABNORMAL HIGH (ref 70–99)
Glucose-Capillary: 142 mg/dL — ABNORMAL HIGH (ref 70–99)
Glucose-Capillary: 138 mg/dL — ABNORMAL HIGH (ref 70–99)

## 2021-03-04 LAB — URIC ACID: Uric Acid, Serum: 0.8 mg/dL — ABNORMAL LOW (ref 3.7–8.6)

## 2021-03-04 LAB — SODIUM, URINE, RANDOM: Sodium, Ur: 167 mmol/L

## 2021-03-04 MED ORDER — IOHEXOL 350 MG/ML SOLN
100.0000 mL | Freq: Once | INTRAVENOUS | Status: AC | PRN
  Administered 2021-03-04: 100 mL via INTRAVENOUS

## 2021-03-04 MED ORDER — IOHEXOL 9 MG/ML PO SOLN
ORAL | Status: AC
  Administered 2021-03-04: 500 mL
  Filled 2021-03-04: qty 1000

## 2021-03-04 MED ORDER — PHENOL 1.4 % MT LIQD
1.0000 | OROMUCOSAL | Status: DC | PRN
  Administered 2021-03-04 – 2021-03-06 (×3): 1 via OROMUCOSAL
  Filled 2021-03-04: qty 177

## 2021-03-04 MED ORDER — TRAVASOL 10 % IV SOLN
INTRAVENOUS | Status: AC
  Filled 2021-03-04: qty 1020

## 2021-03-04 NOTE — Progress Notes (Signed)
[facility] Progress Note  6 Days Post-Op  Subjective: CC- abdominal pain No improvement in abdominal pain from yesterday. Still no stool output and NGT with 800 ml out charted and 600 ml in cannister this am. No nausea or emesis this am. Wife is bedside  Objective: Vital signs in last 24 hours: Temp:  [97.3 F (36.3 C)-98.7 F (37.1 C)] 98.3 F (36.8 C) (09/16 0438) Pulse Rate:  [75-95] 84 (09/16 0800) Resp:  [13-30] 18 (09/16 0800) BP: (122-158)/(50-68) 134/52 (09/16 0800) SpO2:  [89 %-100 %] 100 % (09/16 0800) Last BM Date: 02/22/21  Intake/Output from previous day: 09/15 0701 - 09/16 0700 In: -  Out: 2450 [Urine:1600; Emesis/NG output:800; Drains:50] Intake/Output this shift: Total I/O In: -  Out: [id number] [Urine:1000; Drains:50]  PE: Gen:  Alert, NAD, pleasant Pulm:  Normal rate and effort. CTAB Abd: Soft, mild distension, mild diffuse TTP without rebound or guarding greatest on left abdomen near ostomy and drain.  Drain w/ SS output. Colostomy small amount of dark bloody drainage. NGT w/ bilious output. vac to midline incision with good seal MSK: no calf TTP bilaterally Skin: no rashes noted, warm and dry  Lab Results:  Recent Labs    03/02/21 0659 03/03/21 0546  WBC 13.5* 17.1*  HGB 10.8* 9.3*  HCT 32.3* 28.2*  PLT 409* 407*    BMET Recent Labs    03/03/21 0546  NA 132*  K 4.7  CL 101  CO2 24  GLUCOSE 178*  BUN 16  CREATININE 0.83  CALCIUM 8.1*    PT/INR No results for input(s): LABPROT, INR in the last 72 hours. CMP     Component Value Date/Time   NA 132 (L) 03/03/2021 0546   NA 134 09/30/2019 0959   NA 142 03/03/2016 0921   K 4.7 03/03/2021 0546   K 4.3 03/03/2016 0921   CL 101 03/03/2021 0546   CO2 24 03/03/2021 0546   CO2 25 03/03/2016 0921   GLUCOSE 178 (H) 03/03/2021 0546   GLUCOSE 89 03/03/2016 0921   BUN 16 03/03/2021 0546   BUN 11 09/30/2019 0959   BUN 13.6 03/03/2016 0921   CREATININE 0.83 03/03/2021 0546    CREATININE 1.2 03/03/2016 0921   CALCIUM 8.1 (L) 03/03/2021 0546   CALCIUM 9.6 03/03/2016 0921   PROT 4.7 (L) 03/03/2021 0546   PROT 7.3 03/03/2016 0921   ALBUMIN 1.6 (L) 03/03/2021 0546   ALBUMIN 3.8 03/03/2016 0921   AST 15 03/03/2021 0546   AST 36 (H) 03/03/2016 0921   ALT 11 03/03/2021 0546   ALT 31 03/03/2016 0921   ALKPHOS 53 03/03/2021 0546   ALKPHOS 116 03/03/2016 0921   BILITOT 0.3 03/03/2021 0546   BILITOT 0.71 03/03/2016 0921   GFRNONAA >60 03/03/2021 0546   GFRNONAA 67 07/25/2013 1712   GFRAA >60 10/03/2019 0930   GFRAA 77 07/25/2013 1712   Lipase     Component Value Date/Time   LIPASE 22 02/19/2021 1653       Studies/Results: DG CHEST PORT 1 VIEW  Result Date: 03/03/2021 CLINICAL DATA:  Cough EXAM: PORTABLE CHEST 1 VIEW COMPARISON:  02/19/2021 FINDINGS: The heart size and mediastinal contours are within normal limits. Interval placement of right upper extremity PICC, tip positioned near the superior cavoatrial junction. Interval placement of esophagogastric tube, tip below the diaphragm, side port above the gastroesophageal junction. Both lungs are clear. The visualized skeletal structures are unremarkable. IMPRESSION: 1. No acute abnormality of the lungs in AP portable projection. 2.  Interval placement of right upper extremity PICC, tip positioned near the superior cavoatrial junction. 3. Interval placement of esophagogastric tube, tip below the diaphragm, side port above the gastroesophageal junction. Recommend advancement. Electronically Signed   By: Eddie Candle M.D.   On: 03/03/2021 08:28    Anti-infectives: Anti-infectives (From admission, onward)    Start     Dose/Rate Route Frequency Ordered Stop   02/26/21 0100  doxycycline (VIBRAMYCIN) 100 mg in sodium chloride 0.9 % 250 mL IVPB        100 mg 125 mL/hr over 120 Minutes Intravenous 2 times daily 02/26/21 0005     02/20/21 0300  piperacillin-tazobactam (ZOSYN) IVPB 3.375 g        3.375 g 12.5 mL/hr over  240 Minutes Intravenous Every 8 hours 02/19/21 1907     02/19/21 1900  ceFEPIme (MAXIPIME) 2 g in sodium chloride 0.9 % 100 mL IVPB  Status:  Discontinued        2 g 200 mL/hr over 30 Minutes Intravenous  Once 02/19/21 1857 02/19/21 1906   02/19/21 1900  metroNIDAZOLE (FLAGYL) IVPB 500 mg  Status:  Discontinued        500 mg 100 mL/hr over 60 Minutes Intravenous  Once 02/19/21 1857 02/19/21 1906   02/19/21 1815  piperacillin-tazobactam (ZOSYN) IVPB 3.375 g        3.375 g 100 mL/hr over 30 Minutes Intravenous  Once 02/19/21 1809 02/19/21 1944        Assessment/Plan POD 6 s/p ex lap, sigmoid colectomy, end colostomy with enterorrhaphy and drainage of intraabdominal abscess - Dr. Kieth Brightly and Dr. Radene Knee 9/10 for Acute sigmoid diverticulitis with contained perforation and abscess   - still with ileus - from surgical perspective cont abx therapy for now and await CT today and ID reccs - cont drain - 50 ml output - Wound vac MWF.  - WOCN - Await bowel function and keep NGT to LIWS today - Cont TPN - encouraged ambulation, use IS, multimodal pain control  - PT rec HH   FEN - NGT LIWS, NPO sips/chips, IVF, TPN VTE - heparin gtt ID - zosyn 9/4>>, doxycycline (orchitis). WBC 15.5 (17.1) afebrile Foley - out   Per primary: A fib AKI - resolved ABL anemia - Hgb 9.3 from 10.8, no hypotension or tachycardia, monitor HTN ETOH withdrawal   LOS: 12 days    Winferd Humphrey, [facility] Surgery 03/04/2021, 10:14 AM Please see Amion for pager number during day hours 7:00am-4:30pm

## 2021-03-04 NOTE — Progress Notes (Addendum)
ANTICOAGULATION CONSULT NOTE - Follow Up Consult  Pharmacy Consult for Heparin (Xarelto on hold) Indication: atrial fibrillation  Allergies  Allergen Reactions   Atorvastatin Other (See Comments)    myalgias    Patient Measurements: Height: 6' (182.9 cm) Weight: 82.6 kg (182 lb) IBW/kg (Calculated) : 77.6 Heparin Dosing Weight: 82.6 kg  Vital Signs: Temp: 98.2 F (36.8 C) (09/16 1200) Temp Source: Oral (09/16 1200) BP: 136/52 (09/16 1200) Pulse Rate: 75 (09/16 1200)  Labs: Recent Labs    03/03/21 0546 03/03/21 0549 03/04/21 0940 03/04/21 0941 03/04/21 1120  HGB 9.3*  --  7.8*  --  8.7*  HCT 28.2*  --  24.3*  --  25.5*  PLT 407*  --  356  --  383  HEPARINUNFRC  --  0.43  --  >1.10* <0.10*  CREATININE 0.83  --   --   --  0.86     Estimated Creatinine Clearance: 87.7 mL/min (by C-G formula based on SCr of 0.86 mg/dL).  Assessment: 70 y/o male admitted for sepsis from contained perforated diverticulitis with active ileus. On PTA Xarelto for Afib with last dose on 9/2 at 2000.  On IV heparin; Xarelto held for possible need for surgery.        Heparin held 9/13 pm for ~5 hours due to blood in colostomy bag. Resumed at lower rate of 1600 units/hr on 9/13 pm > next heparin level 0.12 (subtherapeutic) but no rate increase per Surgery.  Subsequent heparin level 0.43 (therapeutic) on 9/14.  Heparin level >1.10 this morning but drawn via PICC and heparin infusing via PICC.  Redrawn after pausing heparin for ~15 minutes and now < 0.10.  Wide variation.  Plan peripheral blood draw to try to increase accuracy or result.     Noted colostomy with small amount of dark bloody drainage.  Goal of Therapy:  Heparin level 0.3-0.5 units/ml (low end of therapeutic range) Monitor platelets by anticoagulation protocol: Yes   Plan:  Continue heparin drip at 1600 units/hr Recheck heparin level with peripheral stick. Daily heparin level and CBC. Monitor bleeding. Xarelto on hold.  Arty Baumgartner, RPh 03/04/2021,2:40 PM  Addendum:   Heparin level is 0.16 via peripheral stick. Subtherapeutic.   Will increase heparin to 1700 units/hr   Next labs in am.  Consuello Masse, RPh 03/04/2021 4:59 PM

## 2021-03-04 NOTE — Consult Note (Signed)
Annetta [facility] Nurse wound follow up Patient receiving care in [facility] 2W01 Wound type: Surgical midline abdominal  Measurement: 14.2 x 5.2 x 2.6 Wound bed: beefy red beginning to form a false bottom Drainage (amount, consistency, odor) serous drainage in canister Periwound: intact Dressing procedure/placement/frequency: One piece of black foam removed. One piece of black foam replaced into the wound. Drape applied and immediate suction obtained at 125 mmHg. Change on M/W/F  Photo taken on 02/19/21  WOC Nurse ostomy follow up Stoma type/location: LUQ colostomy Stomal assessment/size: 1 3/8" red budded just above the level of the skin. Peristomal assessment: intact Treatment options for stomal/peristomal skin: barrier wipes and barrier ring Output: scant thin bloody Ostomy pouching: 1pc. convex  Education provided: Wife was feeling very overwhelmed today with all of the medical teams that have been coming to the room and wanted to just observe today and make notes. States she has read thoroughly over the education material that was given and is beginning to remember and will try more on Monday. She opened and closed the pouch and was able to repeat back to me previous education.  Enrolled patient in [place] Start Discharge program: Yes (previously) Wound recommend (referral) follow up in ostomy clinic at discharge.  [facility] team will follow M/W/F Jocelyn Lamer L. Tamala Julian, MSN, RN, [place], [name], [facility] Wound Treatment Associate Pager [phone]

## 2021-03-04 NOTE — Consult Note (Signed)
[name]    Date of Admission:  02/19/2021     Reason for Consult: abd abscess/diverticulitis    Referring Provider: Candiss Norse   Lines:  9/08-c rue picc  Abx: 9/10-c doxycycline 9/04-c piptazo        Assessment: -Complicated diverticulitis with intraabd abscess -S/p ir placed drain into abscess 9/08 -S/p exlap, sigmoid collectomy, and washout with diverting colostomy 9/10 due to progression of disease -On tpn -ileus  -epididymitis  9/08 ir drain cx no esbl or yeast/enterococcus Afebrile this admission. Wbc trending down again since surgery  Ileus awaiting repeat abd ct  Given surgery likely has good source control. Would treat for another 7 days with piptazo from 9/11-9/18, but will be covered with epidydimitis course  The epididymitis suspect similar spectrum of bacteria as coliform type, given his monogamous relationship and age. Doxy started previously as well. We can finish 4 more days (for 10 day course -- only cover empirically for chlamydia) and also check urine gc/chlam pcr for purpose of dx; otherwise piptazo should cover enteric bacteria. Plan 10 day tx from 9/10 as well    Plan: Continue piptazo until 9/20 Finish doxy on 9/20 Urine gc/chlamydia, rpr, hiv screen Discussed with primary team   I spent 60 minute reviewing data/chart, and coordinating care and >50% direct face to face time providing counseling/discussing diagnostics/treatment plan with patient       ------------------------------------------------ Principal Problem:   Diverticulitis of large intestine with perforation and abscess without bleeding Active Problems:   Atrial fibrillation, chronic ([employer])   Essential hypertension   Diverticulitis with obstruction ([place])   AKI (acute kidney injury) ([place])   Severe sepsis with acute organ dysfunction ([place])    HPI: Travis Conrad is a 70 y.o. male admitted 9/03 for sepsis in setting diveritulitis/abd  abscess, course complicated by progression on conservative management as well as epididymitis   Patient had IR drain placement into abscess on 9/07 but wasn't clinically improving On 9/10 underwent exlap and hartman's procedure Currently on tpn with no function of the colostomy yet. Surgery suspect ileus  No clinical sepsis otherwise  Other issue is on 9/12 noticed right testicular pain/swelling. U/s suggestive of epididymitis. Monogamous relationship no prior std  No fc Wbc improving after brief uptick since surgery  No rash No n Pain controlled in abdomen  Ir drain cx polymicrobial but no esbl/yeast    Family History  Problem Relation Age of Onset   Other Brother     Social History   Tobacco Use   Smoking status: Every Day    Packs/day: 1.00    Years: 50.00    Pack years: 50.00    Types: Cigarettes   Smokeless tobacco: Never   Tobacco comments:    1/2-1ppd  Vaping Use   Vaping Use: Never used  Substance Use Topics   Alcohol use: Not Currently    Comment: 1/2 gallon of whiskey a week   Drug use: Yes    Types: Cocaine, Marijuana    Comment: 08/14/2016 "last cocaine was in early 1990s; smoked pot yesterday"    Allergies  Allergen Reactions   Atorvastatin Other (See Comments)    myalgias    Review of Systems: ROS All Other ROS was negative, except mentioned above   Past Medical History:  Diagnosis Date   Atrial fibrillation (HCC)    Dysrhythmia    a-fib   High cholesterol    "RX made groin hurt" (  08/14/2016)   Hypertension        Scheduled Meds:  amLODipine  10 mg Per Tube Daily   Chlorhexidine Gluconate Cloth  6 each Topical Daily   cloNIDine  0.1 mg Per Tube BID   folic acid  1 mg Per Tube Daily   insulin aspart  0-15 Units Subcutaneous Q4H   iohexol       lidocaine  1 patch Transdermal Q24H   melatonin  3 mg Per Tube QHS   metoprolol tartrate  100 mg Per Tube BID   nicotine  21 mg Transdermal Daily   pantoprazole (PROTONIX) IV  40 mg  Intravenous Q24H   sodium chloride flush  10 mL Intracatheter Q12H   thiamine  100 mg Per Tube Daily   Or   thiamine  100 mg Intravenous Daily   Continuous Infusions:  sodium chloride Stopped (02/27/21 0414)   amiodarone 30 mg/hr (03/04/21 0323)   doxycycline (VIBRAMYCIN) IV 100 mg (03/04/21 [id number])   heparin 1,600 Units/hr (03/03/21 2201)   methocarbamol (ROBAXIN) IV 1,000 mg (03/04/21 0504)   piperacillin-tazobactam (ZOSYN)  IV 3.375 g (03/04/21 0324)   TPN ADULT (ION) 85 mL/hr at 03/03/21 1747   PRN Meds:.sodium chloride, hydrALAZINE, menthol-cetylpyridinium, metoprolol tartrate, morphine injection, [DISCONTINUED] ondansetron **OR** ondansetron (ZOFRAN) IV, oxyCODONE, zolpidem   OBJECTIVE: Blood pressure (!) 134/52, pulse 84, temperature 98.3 F (36.8 C), temperature source Oral, resp. rate 18, height 6' (1.829 m), weight 82.6 kg, SpO2 100 %.  Physical Exam  General/constitutional: no distress, pleasant HEENT: Normocephalic, PER, Conj Clear, EOMI, Oropharynx clear Neck supple CV: rrr no mrg Lungs: clear to auscultation, normal respiratory effort Abd: Soft. Wound vac functioning. Colostomy no output. No erythema/fluctuance around incision Ext: no edema Skin: No Rash Neuro: nonfocal MSK: no peripheral joint swelling/tenderness/warmth; back spines nontender Psych alert/oriented  Central line presence: rue picc site no erythema/purulence   Lab Results Lab Results  Component Value Date   WBC 15.5 (H) 03/04/2021   HGB 7.8 (L) 03/04/2021   HCT 24.3 (L) 03/04/2021   MCV 112.0 (H) 03/04/2021   PLT 356 03/04/2021    Lab Results  Component Value Date   CREATININE 0.83 03/03/2021   BUN 16 03/03/2021   NA 132 (L) 03/03/2021   K 4.7 03/03/2021   CL 101 03/03/2021   CO2 24 03/03/2021    Lab Results  Component Value Date   ALT 11 03/03/2021   AST 15 03/03/2021   ALKPHOS 53 03/03/2021   BILITOT 0.3 03/03/2021      Microbiology: Recent Results (from the past 240  hour(s))  Aerobic/Anaerobic Culture w Gram Stain (surgical/deep wound)     Status: None   Collection Time: 02/24/21  3:14 PM   Specimen: Abdomen; Abscess  Result Value Ref Range Status   Specimen Description ABDOMEN  Final   Special Requests NONE  Final   Gram Stain   Final    FEW SQUAMOUS EPITHELIAL CELLS PRESENT ABUNDANT WBC PRESENT,BOTH PMN AND MONONUCLEAR FEW GRAM POSITIVE COCCI FEW GRAM POSITIVE RODS    Culture   Final    MODERATE BACTEROIDES FRAGILIS MODERATE BACTEROIDES THETAIOTAOMICRON BETA LACTAMASE POSITIVE Performed at [facility], [place] [address], [place], [place] [address]    Report Status 02/28/2021 FINAL  Final     Serology:    Imaging: If present, new imagings (plain films, ct scans, and mri) have been personally visualized and interpreted; radiology reports have been reviewed. Decision making incorporated into the Impression / Recommendations.  9/07  abd pelv ct   1. Acute sigmoid colon diverticulitis changes are again noted. There is an air-fluid collection with surrounding inflammation in the pelvis adjacent to the sigmoid colon worrisome for perforation with abscess formation. This collection has increased in size now measuring 5.2 x 3.0 x 3.7 cm. 2. Acute uncomplicated ascending colon diverticulitis. 3. Dilated proximal small bowel may represent partial small bowel obstruction or ileus. 4. New small bilateral pleural effusions with bibasilar atelectasis. 5. New presacral edema and body wall edema. 6. Aortic Atherosclerosis  9/09 testicular u/s 1. Negative for acute testicular torsion. 2. Heterogenous slightly hyperemic right greater than left epididymis suggesting epididymitis 3. Large complex right hydrocele contains fluid fluid levels; echogenic dependent more complex fluid could be secondary to blood, inflammation, or infection. 4. Small left varicocele   Jabier Mutton, [facility] [place] [phone] pager    03/04/2021, 11:08 AM

## 2021-03-04 NOTE — Progress Notes (Signed)
PROGRESS NOTE        PATIENT DETAILS Name: Travis Conrad Age: 70 y.o. Sex: male Date of Birth: [birth year] Admit Date: 02/19/2021 Admitting Physician Etta Quill, DO PCP:Sun, [name], MD  Brief Narrative: Patient is a 70 y.o. male A. fib, HTN, HLD, prior diverticulitis, heavy EtOH use-who presented with lower abdominal pain/nausea/vomiting-on initial presentation to the ED found to have severe sepsis physiology with hypotension/AKI-felt to be due to perforated diverticulitis.  See below for further details.  Significant events: 9/3>> presented with lower abdominal pain/vomiting-sepsis from contained perforated diverticulitis.  Found to have very active ileus/PSBO from diverticulitis/bowel inflammation 9/4>> A. fib with RVR-not responding to Lopressor/Cardizem-BP soft-started amiodarone infusion-cardiology consulted. 9/7>> increase in size of pelvic abscess on repeat CT. 9/8>> CT-guided drainage of pelvic abscess 9/9>> right testicular pain/swelling-scrotal ultrasound with Doppler pending.  Significant studies: 9/3>> CT abdomen/pelvis: Acute sigmoid diverticulitis-small extraluminal gas-fluid collection in the sigmoid mesocolon consistent with abscess.  Dilated proximal small bowel-likely ileus. 9/7>> CT abdomen/pelvis: Abscess has increased in size 9/7>> Echo: EF 60-65%.  Antimicrobial therapy: Zosyn: 9/3>>  Microbiology data: 9/3>> COVID/influenza PCR: Negative 9/3>> blood culture: Negative 9/8>> culture pelvic abscess: Pending.  Procedures : 9/8>> CT-guided placement of 12 French drainage catheter into pelvic collection. 9/10>> s/p ex lap, sigmoid colectomy, end colostomy with enterorrhaphy and drainage of intraabdominal abscess - Dr. Kieth Brightly and Dr. Radene Knee 9/10  Consults: General surgery Cardiology Interventional radiology Urology  DVT Prophylaxis : SCDs Start: 02/20/21 0002>> Heparin drip.   Subjective: Patient in bed denies any  headache, no chest pain or shortness of breath, still having abdominal pain, scrotal discomfort gradually better but still painful.    Assessment/Plan:  Severe sepsis due to diverticulitis with contained perforation and small abscess: Sepsis physiology has resolved-although continues to have leukocytosis - remains on Zosyn.  Underwent CT- guided drainage of pelvic abscess on 02/25/21 followed by ex.Laprotomy, sigmoid colectomy, end colostomy with enterorrhaphy and drainage of intraabdominal abscess - Dr. Kieth Brightly and Dr. Radene Knee  on 02/26/2021, continue TNA, NG tube to intermittent suction nurse Opoku requested to advance by 3 cm on 03/03/2021, will repeat chest x-ray on 03/05/2019 to, supportive care and monitor.  Will discontinue Zosyn once okay by general surgery.  Ileus: Now postop ileus continue NG tube, see #1 above.    PAF with RVR: RVR provoked by sepsis/alcohol withdrawal-currently in sinus rhythm-remains on amiodarone infusion added PO Lopressor 02/27/21 .  On IV heparin >> to Xarelto once GI issues resolve .  Cardiology following.     Right scrotal pain and swelling due to epididymitis : Has been placed on doxycycline in addition to Zosyn, symptoms improving continue to monitor, denies any exposure or unprotected sex in the last few years, no unusual discharge.  Uro input appreciated, will change Doxy >> Levaquin once Zosyn is stopped, risk of C Diff is high,  total Rx 2 weeks - stop date 03/12/21, will DW ID 03/04/21.  AKI: Hemodynamically mediated-improved-creatinine has now normalized.  Hypokalemia/hypophosphatemia: Repleted and has normalized.  Continue to follow periodically.  Alcohol withdrawal: Awake and alert this morning-last drink was on 9/2, counseled to quit alcohol, no DTs, resolved.  HTN: Placed on oral beta-blocker, Norvasc added Catapres + PRN Hydralazine, pain control and monitor.  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   Diet: Diet  Order             Diet NPO time specified Except for: Sips with Meds  Diet effective now                    Code Status:  Full code  Family Communication:  Spouse-Jimmi Macqueen-[phone] updated 03/02/21  Disposition Plan:  Status is: Inpatient  Remains inpatient appropriate because:Inpatient level of care appropriate due to severity of illness  Dispo: The patient is from: Home              Anticipated d/c is to: Home              Patient currently is not medically stable to d/c.   Difficult to place patient No   Barriers to Discharge: Perforated diverticulitis with abscess-on IV antibiotics-developing alcohol withdrawal symptoms/PAF with RVR.  Antimicrobial agents: Anti-infectives (From admission, onward)    Start     Dose/Rate Route Frequency Ordered Stop   02/26/21 0100  doxycycline (VIBRAMYCIN) 100 mg in sodium chloride 0.9 % 250 mL IVPB        100 mg 125 mL/hr over 120 Minutes Intravenous 2 times daily 02/26/21 0005     02/20/21 0300  piperacillin-tazobactam (ZOSYN) IVPB 3.375 g        3.375 g 12.5 mL/hr over 240 Minutes Intravenous Every 8 hours 02/19/21 1907     02/19/21 1900  ceFEPIme (MAXIPIME) 2 g in sodium chloride 0.9 % 100 mL IVPB  Status:  Discontinued        2 g 200 mL/hr over 30 Minutes Intravenous  Once 02/19/21 1857 02/19/21 1906   02/19/21 1900  metroNIDAZOLE (FLAGYL) IVPB 500 mg  Status:  Discontinued        500 mg 100 mL/hr over 60 Minutes Intravenous  Once 02/19/21 1857 02/19/21 1906   02/19/21 1815  piperacillin-tazobactam (ZOSYN) IVPB 3.375 g        3.375 g 100 mL/hr over 30 Minutes Intravenous  Once 02/19/21 1809 02/19/21 1944        Time spent: 35 minutes-Greater than 50% of this time was spent in counseling, explanation of diagnosis, planning of further management, and coordination of care.  MEDICATIONS: Scheduled Meds:  amLODipine  10 mg Per Tube Daily   Chlorhexidine Gluconate  Cloth  6 each Topical Daily   cloNIDine  0.1 mg Per Tube BID   folic acid  1 mg Per Tube Daily   insulin aspart  0-15 Units Subcutaneous Q4H   lidocaine  1 patch Transdermal Q24H   melatonin  3 mg Per Tube QHS   metoprolol tartrate  100 mg Per Tube BID   nicotine  21 mg Transdermal Daily   pantoprazole (PROTONIX) IV  40 mg Intravenous Q24H   sodium chloride flush  10 mL Intracatheter Q12H   thiamine  100 mg Per Tube Daily   Or   thiamine  100 mg Intravenous Daily   Continuous Infusions:  sodium chloride Stopped (02/27/21 0414)   amiodarone 30 mg/hr (03/04/21 0323)   doxycycline (VIBRAMYCIN) IV 100 mg (03/04/21 0838)   heparin 1,600 Units/hr (03/03/21 2201)   methocarbamol (ROBAXIN) IV 1,000 mg (03/04/21 0504)   piperacillin-tazobactam (ZOSYN)  IV 3.375 g (03/04/21 0324)   TPN ADULT (ION) 85 mL/hr at 03/03/21 1747   PRN Meds:.sodium chloride, hydrALAZINE, menthol-cetylpyridinium, metoprolol tartrate, morphine injection, [DISCONTINUED] ondansetron **OR** ondansetron (ZOFRAN) IV, oxyCODONE, zolpidem   PHYSICAL EXAM: Vital signs: Vitals:   03/03/21 2100 03/04/21 0016 03/04/21 [id number]  03/04/21 0800  BP: (!) 130/57 (!) 122/50 (!) 127/50 (!) 134/52  Pulse: 83 80 84 84  Resp: '20 13 19 18  '$ Temp:  98.4 F (36.9 C) 98.3 F (36.8 C)   TempSrc:  Oral Oral   SpO2: 98% 100% 99% 100%  Weight:      Height:       Filed Weights   02/19/21 1800 02/20/21 2000 02/26/21 0913  Weight: 79.4 kg 79.4 kg 82.6 kg   Body mass index is 24.68 kg/m.   Exam  Awake Alert, No new F.N deficits, NG in place, colostomy in place, scrotal swelling slowly improving [place].AT,PERRAL Supple Neck,No JVD, No cervical lymphadenopathy appriciated.  Symmetrical Chest wall movement, Good air movement bilaterally, CTAB RRR,No Gallops, Rubs or new Murmurs, No Parasternal Heave hypoactive B.Sounds, Abd Soft but tender No Cyanosis, Clubbing or edema, No new Rash or bruise   I have personally reviewed following labs  and imaging studies  LABORATORY DATA:  Recent Labs  Lab 02/27/21 0322 03/01/21 0441 03/01/21 1901 03/02/21 0659 03/03/21 0546  WBC 19.6* 13.9* 14.7* 13.5* 17.1*  HGB 12.4* 11.3* 11.2* 10.8* 9.3*  HCT 36.4* 33.3* 33.9* 32.3* 28.2*  PLT 355 390 432* 409* 407*  MCV 100.6* 101.8* 103.4* 102.5* 103.3*  MCH 34.3* 34.6* 34.1* 34.3* 34.1*  MCHC 34.1 33.9 33.0 33.4 33.0  RDW 13.2 14.2 14.1 14.4 14.3  LYMPHSABS  --   --  2.5  --   --   MONOABS  --   --  1.3*  --   --   EOSABS  --   --  0.0  --   --   BASOSABS  --   --  0.1  --   --     Recent Labs  Lab 02/25/21 0957 02/26/21 0135 02/27/21 0322 02/28/21 0440 03/01/21 0441 03/03/21 0546  NA  --  134* 134* 135 134* 132*  K  --  3.9 4.1 4.0 4.2 4.7  CL  --  99 101 101 102 101  CO2  --  '28 26 26 24 24  '$ GLUCOSE  --  179* 191* 128* 147* 178*  BUN  --  '14 13 13 15 16  '$ CREATININE  --  1.07 0.91 0.97 0.82 0.83  CALCIUM  --  8.2* 8.1* 8.3* 8.0* 8.1*  AST  --   --   --  17  --  15  ALT  --   --   --  10  --  11  ALKPHOS  --   --   --  50  --  53  BILITOT  --   --   --  0.4  --  0.3  ALBUMIN  --   --   --  1.7*  --  1.6*  MG  --  2.0 1.9 1.9 2.0 2.1  PROCALCITON  --   --   --   --   --  <0.10  HGBA1C 5.9*  --   --   --   --   --      RADIOLOGY STUDIES/RESULTS: DG CHEST PORT 1 VIEW  Result Date: 03/03/2021 CLINICAL DATA:  Cough EXAM: PORTABLE CHEST 1 VIEW COMPARISON:  02/19/2021 FINDINGS: The heart size and mediastinal contours are within normal limits. Interval placement of right upper extremity PICC, tip positioned near the superior cavoatrial junction. Interval placement of esophagogastric tube, tip below the diaphragm, side port above the gastroesophageal junction. Both lungs are clear. The visualized skeletal structures are unremarkable. IMPRESSION:  1. No acute abnormality of the lungs in AP portable projection. 2. Interval placement of right upper extremity PICC, tip positioned near the superior cavoatrial junction. 3. Interval  placement of esophagogastric tube, tip below the diaphragm, side port above the gastroesophageal junction. Recommend advancement. Electronically Signed   By: Eddie Candle M.D.   On: 03/03/2021 08:28     LOS: 12 days   Signature  Lala Lund M.D on 03/04/2021 at 9:08 AM   -  To page go to [email or web address]    03/04/2021, 9:08 AM

## 2021-03-04 NOTE — Progress Notes (Signed)
PHARMACY - TOTAL PARENTERAL NUTRITION CONSULT NOTE   Indication: Prolonged ileus  Patient Measurements: Height: 6' (182.9 cm) Weight: 82.6 kg (182 lb) IBW/kg (Calculated) : 77.6 TPN AdjBW (KG): 82.6 Body mass index is 24.68 kg/m.  Assessment: 89 YOM with lower abdominal pain/N/V found to have perforated diverticulitis. CT abd on 9/3 found to have active ileus/pSBO from diverticulitis/bowel inflammation. Pharmacy consulted to start TPN for prolonged ileus. Of note patient has a h/o alcohol dependency and is at high risk for refeeding.   Glucose / Insulin: no hx DM, CBGs 140-160s. 17 units mSSI utilized in last 24hrs Electrolytes: Labs 9/16 contaminated and to be redrawn. Last labs from 9/15: Na 132, K up to 4.7 (goal >/=4 with ileus), Mg 2.1 (goal >/=2 with ileus), others WNL Renal: SCr <1 stable, BUN WNL Hepatic: LFTs / Tbili / TG wnl, Albumin 1.6 Intake / Output; MIVF: NG 1651m, Drain output 964m UOP 0.39m49mg/hr  GI Imaging: 9/3 CT abd: Active ileus/PSBO GI Surgeries / Procedures: 9/10 ex-lap, sigmoid colectomy w/ end colostomy creation, enterorrhaphy, drainage of intra-abd abscess, wound vac placement  Central access: PICC line 9/8 >>  TPN start date: 9/9>>   Nutritional Goals: Goal TPN rate is 85 mL/hr (provides 102 g of protein and 2104 kcals per day)  RD Assessment: Estimated Needs Total Energy Estimated Needs: 2000-2200 Total Protein Estimated Needs: 100-110 grams Total Fluid Estimated Needs: >2L  Current Nutrition:  TPN; NPO  Plan:  Continue TPN at goal rate 22m65m at 1800 Electrolytes in TPN: continue same today (labs today contaminated, unable to obtain redraw prior to TPN order cut-off ttime) - Na 110mE69m K 45mEq36mCa 5mEq/L73mg 9mEq/L,35md Phos 20mmol/L33m:Ac 1:1 Add standard MVI and trace elements to TPN Continue Moderate q4h SSI and adjust as needed Monitor standard TPN labs Mon/Thurs and PRN (next 9/17) F/u resolution of ileus - Surgery planning  repeat CT today   Suzanne Kho vonArturo Morton BCPS Please check AMION for all [facility] Clinical Pharmacist 03/04/2021 9:02 AM

## 2021-03-04 NOTE — Plan of Care (Signed)
  Problem: Nutrition: Goal: Adequate nutrition will be maintained Outcome: Progressing   Problem: Coping: Goal: Level of anxiety will decrease Outcome: Progressing   Problem: Safety: Goal: Ability to remain free from injury will improve Outcome: Progressing   Problem: Skin Integrity: Goal: Risk for impaired skin integrity will decrease Outcome: Progressing   

## 2021-03-05 ENCOUNTER — Inpatient Hospital Stay (HOSPITAL_COMMUNITY): Payer: Medicare Other

## 2021-03-05 DIAGNOSIS — Z79899 Other long term (current) drug therapy: Secondary | ICD-10-CM

## 2021-03-05 DIAGNOSIS — I480 Paroxysmal atrial fibrillation: Secondary | ICD-10-CM | POA: Diagnosis not present

## 2021-03-05 DIAGNOSIS — I482 Chronic atrial fibrillation, unspecified: Secondary | ICD-10-CM | POA: Diagnosis not present

## 2021-03-05 LAB — CBC
HCT: 25.3 % — ABNORMAL LOW (ref 39.0–52.0)
Hemoglobin: 8.7 g/dL — ABNORMAL LOW (ref 13.0–17.0)
MCH: 34.5 pg — ABNORMAL HIGH (ref 26.0–34.0)
MCHC: 34.4 g/dL (ref 30.0–36.0)
MCV: 100.4 fL — ABNORMAL HIGH (ref 80.0–100.0)
Platelets: 397 10*3/uL (ref 150–400)
RBC: 2.52 MIL/uL — ABNORMAL LOW (ref 4.22–5.81)
RDW: 14.3 % (ref 11.5–15.5)
WBC: 21.6 10*3/uL — ABNORMAL HIGH (ref 4.0–10.5)
nRBC: 0 % (ref 0.0–0.2)

## 2021-03-05 LAB — PROCALCITONIN: Procalcitonin: <0.1 ng/mL

## 2021-03-05 LAB — COMPREHENSIVE METABOLIC PANEL
ALT: 11 U/L (ref 0–44)
AST: 12 U/L — ABNORMAL LOW (ref 15–41)
Albumin: 1.6 g/dL — ABNORMAL LOW (ref 3.5–5.0)
Alkaline Phosphatase: 68 U/L (ref 38–126)
Anion gap: 10 (ref 5–15)
BUN: 17 mg/dL (ref 8–23)
CO2: 21 mmol/L — ABNORMAL LOW (ref 22–32)
Calcium: 8.4 mg/dL — ABNORMAL LOW (ref 8.9–10.3)
Chloride: 97 mmol/L — ABNORMAL LOW (ref 98–111)
Creatinine, Ser: 0.89 mg/dL (ref 0.61–1.24)
GFR, Estimated: >60 mL/min (ref >60)
Glucose, Bld: 166 mg/dL — ABNORMAL HIGH (ref 70–99)
Potassium: 4.7 mmol/L (ref 3.5–5.1)
Sodium: 128 mmol/L — ABNORMAL LOW (ref 135–145)
Total Bilirubin: 0.5 mg/dL (ref 0.3–1.2)
Total Protein: 5.1 g/dL — ABNORMAL LOW (ref 6.5–8.1)

## 2021-03-05 LAB — GLUCOSE, CAPILLARY
Glucose-Capillary: 133 mg/dL — ABNORMAL HIGH (ref 70–99)
Glucose-Capillary: 136 mg/dL — ABNORMAL HIGH (ref 70–99)
Glucose-Capillary: 146 mg/dL — ABNORMAL HIGH (ref 70–99)
Glucose-Capillary: 149 mg/dL — ABNORMAL HIGH (ref 70–99)
Glucose-Capillary: 152 mg/dL — ABNORMAL HIGH (ref 70–99)
Glucose-Capillary: 155 mg/dL — ABNORMAL HIGH (ref 70–99)
Glucose-Capillary: 162 mg/dL — ABNORMAL HIGH (ref 70–99)

## 2021-03-05 LAB — HEPARIN LEVEL (UNFRACTIONATED)
Heparin Unfractionated: 0.14 IU/mL — ABNORMAL LOW (ref 0.30–0.70)
Heparin Unfractionated: 0.27 IU/mL — ABNORMAL LOW (ref 0.30–0.70)
Heparin Unfractionated: 0.34 IU/mL (ref 0.30–0.70)

## 2021-03-05 LAB — HIV ANTIBODY (ROUTINE TESTING W REFLEX): HIV Screen 4th Generation wRfx: NONREACTIVE

## 2021-03-05 LAB — RPR: RPR Ser Ql: NONREACTIVE

## 2021-03-05 LAB — MAGNESIUM: Magnesium: 2.1 mg/dL (ref 1.7–2.4)

## 2021-03-05 LAB — PHOSPHORUS: Phosphorus: 3.7 mg/dL (ref 2.5–4.6)

## 2021-03-05 LAB — OSMOLALITY: Osmolality: 277 mOsm/kg (ref 275–295)

## 2021-03-05 MED ORDER — BUTAMBEN-TETRACAINE-BENZOCAINE 2-2-14 % EX AERO
1.0000 | INHALATION_SPRAY | Freq: Four times a day (QID) | CUTANEOUS | Status: DC | PRN
  Filled 2021-03-05 (×2): qty 20

## 2021-03-05 MED ORDER — THIAMINE HCL 100 MG/ML IJ SOLN
INTRAMUSCULAR | Status: AC
  Filled 2021-03-05: qty 1020

## 2021-03-05 MED ORDER — UREA 15 G PO PACK
15.0000 g | PACK | Freq: Two times a day (BID) | ORAL | Status: AC
  Administered 2021-03-05 – 2021-03-06 (×2): 15 g
  Filled 2021-03-05 (×3): qty 1

## 2021-03-05 MED ORDER — FUROSEMIDE 10 MG/ML IJ SOLN: 20.0000 mg | Freq: Once | INTRAMUSCULAR | Status: DC

## 2021-03-05 NOTE — Progress Notes (Signed)
ANTICOAGULATION CONSULT NOTE - Follow Up Consult  Pharmacy Consult for heparin Indication: atrial fibrillation  Labs: Recent Labs    03/03/21 0546 03/03/21 0549 03/04/21 0940 03/04/21 0941 03/04/21 1120 03/04/21 1529 03/05/21 0129  HGB 9.3*  --  7.8*  --  8.7*  --  8.7*  HCT 28.2*  --  24.3*  --  25.5*  --  25.3*  PLT 407*  --  356  --  383  --  397  HEPARINUNFRC  --    < >  --    < > <0.10* 0.16* 0.14*  CREATININE 0.83  --   --   --  0.86  --  0.89   < > = values in this interval not displayed.    Assessment: 70yo male subtherapeutic on heparin after rate change; no infusion issues per RN; no blood in colostomy bag but does have some unchanged bloody drainage from JP drain; Hgb stable from yesterday.  Goal of Therapy:  Heparin level 0.3-0.5 units/ml   Plan:  Will increase heparin infusion cautiously to 1800 units/hr and check level in 6 hours.    Wynona Neat, PharmD, BCPS  03/05/2021,3:22 AM

## 2021-03-05 NOTE — Progress Notes (Signed)
Progress Note  Patient Name: Travis Conrad Date of Encounter: 03/05/2021  Primary Cardiologist: [name], MD  Subjective   Intermittent abdominal discomfort, some hiccups this morning.  No chest pain or palpitations.  Inpatient Medications    Scheduled Meds:  amLODipine  10 mg Per Tube Daily   Chlorhexidine Gluconate Cloth  6 each Topical Daily   cloNIDine  0.1 mg Per Tube BID   folic acid  1 mg Per Tube Daily   insulin aspart  0-15 Units Subcutaneous Q4H   lidocaine  1 patch Transdermal Q24H   melatonin  3 mg Per Tube QHS   metoprolol tartrate  100 mg Per Tube BID   nicotine  21 mg Transdermal Daily   pantoprazole (PROTONIX) IV  40 mg Intravenous Q24H   sodium chloride flush  10 mL Intracatheter Q12H   thiamine  100 mg Per Tube Daily   Or   thiamine  100 mg Intravenous Daily   urea  15 g Per Tube BID   Continuous Infusions:  sodium chloride Stopped (03/05/21 0320)   amiodarone 30 mg/hr (03/05/21 0400)   doxycycline (VIBRAMYCIN) IV Stopped (03/04/21 2330)   heparin 1,800 Units/hr (03/05/21 0525)   methocarbamol (ROBAXIN) IV 1,000 mg (03/05/21 0527)   piperacillin-tazobactam (ZOSYN)  IV 12.5 mL/hr at 03/05/21 0400   TPN ADULT (ION) 85 mL/hr at 03/05/21 0400   PRN Meds: sodium chloride, butamben-tetracaine-benzocaine, hydrALAZINE, menthol-cetylpyridinium, metoprolol tartrate, morphine injection, [DISCONTINUED] ondansetron **OR** ondansetron (ZOFRAN) IV, oxyCODONE, phenol, zolpidem   Vital Signs    Vitals:   03/05/21 0021 03/05/21 0413 03/05/21 0746 03/05/21 0800  BP: (!) 141/60 121/61 138/60 (!) 148/64  Pulse: 87 88 87 87  Resp: '20 18 18 20  '$ Temp: 98.2 F (36.8 C) 98.5 F (36.9 C) 98.4 F (36.9 C) 98.3 F (36.8 C)  TempSrc: Oral Axillary Axillary Oral  SpO2: 98% 96% 98% 99%  Weight:      Height:        Intake/Output Summary (Last 24 hours) at 03/05/2021 0854 Last data filed at 03/05/2021 0749 Gross per 24 hour  Intake 5972.07 ml  Output 3005 ml   Net 2967.07 ml   Filed Weights   02/19/21 1800 02/20/21 2000 02/26/21 0913  Weight: 79.4 kg 79.4 kg 82.6 kg    Telemetry    Sinus rhythm.  Personally reviewed.  ECG    No ECG reviewed.  Physical Exam   GEN: No acute distress.  NG tube in place. Neck: No JVD. Cardiac: RRR, no gallop.  Respiratory: Nonlabored. Clear to auscultation bilaterally. GI: Soft, bowel sounds present. MS: No edema; No deformity. Neuro:  Nonfocal. Psych: Alert and oriented x 3. Normal affect.  Labs    Chemistry Recent Labs  Lab 03/03/21 0546 03/04/21 1120 03/05/21 0129  NA 132* 131* 128*  K 4.7 4.9 4.7  CL 101 101 97*  CO2 24 23 21*  GLUCOSE 178* 111* 166*  BUN '16 17 17  '$ CREATININE 0.83 0.86 0.89  CALCIUM 8.1* 7.9* 8.4*  PROT 4.7* 4.8* 5.1*  ALBUMIN 1.6* 1.6* 1.6*  AST 15 12* 12*  ALT '11 9 11  '$ ALKPHOS 53 60 68  BILITOT 0.3 0.5 0.5  GFRNONAA >60 >60 >60  ANIONGAP '7 7 10     '$ Hematology Recent Labs  Lab 03/04/21 0940 03/04/21 1120 03/05/21 0129  WBC 15.5* 19.1* 21.6*  RBC 2.17* 2.48* 2.52*  HGB 7.8* 8.7* 8.7*  HCT 24.3* 25.5* 25.3*  MCV 112.0* 102.8* 100.4*  MCH 35.9*  35.1* 34.5*  MCHC 32.1 34.1 34.4  RDW 15.5 14.3 14.3  PLT 356 383 397    Radiology    CT ABDOMEN PELVIS W CONTRAST  Result Date: 03/04/2021 CLINICAL DATA:  Abdominal distension EXAM: CT ABDOMEN AND PELVIS WITH CONTRAST TECHNIQUE: Multidetector CT imaging of the abdomen and pelvis was performed using the standard protocol following bolus administration of intravenous contrast. CONTRAST:  125m OMNIPAQUE IOHEXOL 350 MG/ML SOLN COMPARISON:  Abdominal radiograph 02/26/2021. CT abdomen and pelvis 02/23/2021 FINDINGS: Lower chest: Small bilateral pleural effusions with basilar atelectasis, slightly greater on the right. Enteric tube in place with tip just below the EG junction. Coronary artery calcifications. Hepatobiliary: No focal liver abnormality is seen. No gallstones, gallbladder wall thickening, or biliary  dilatation. Pancreas: Unremarkable. No pancreatic ductal dilatation or surrounding inflammatory changes. Spleen: Normal in size without focal abnormality. Adrenals/Urinary Tract: Subcentimeter left adrenal gland nodule, unchanged. No follow-up is indicated. Kidneys are symmetrical in size with symmetrical nephrograms. Prominent stranding in the left pararenal fat, increasing since prior study. This could indicate infection or contusion. No hydronephrosis or hydroureter. Bladder is unremarkable. Stomach/Bowel: Stomach is mildly distended with gas and contrast material. No wall thickening. Small duodenal diverticulum. Proximal jejunal loops are moderately distended with decompression of the distal small bowel suggesting probable bowel obstruction versus ileus. Contrast material is demonstrated in the colon suggesting there is no complete obstruction. Since the prior CT study, there is interval placement of a left lower quadrant diverting colostomy. The colon is decompressed. Left lower quadrant drain is present. The previous pelvic abscess has resolved. However, along the left iliopsoas muscle, there is a new heterogeneous collection measuring 5.6 x 9.1 cm in diameter. Fluid density level is present suggesting that this represents hemorrhagic fluid. This could represent a postoperative collection or abscess. Associated stranding along the retroperitoneal fat. Vascular/Lymphatic: Calcification of the aorta. No aneurysm. No significant lymphadenopathy. Reproductive: Prostate gland is enlarged. Limited inclusion of the scrotal contents with suggestion of scrotal fluid. Other: Small amount of free fluid in the pelvis is likely reactive. Scarring along the midline consistent with postoperative change. Musculoskeletal: Degenerative change fall fine. No destructive bone lesions. IMPRESSION: 1. Large heterogeneous left iliopsoas collection may represent abscess, hematoma, or postoperative fluid collection. Stranding in the  retroperitoneum and left pararenal fat as well as subcutaneous tissues could be inflammatory or contusion. 2. Interval postoperative changes with left lower quadrant colostomy and left lower quadrant pelvic drainage catheter. The previous diverticular abscess has resolved. 3. Mild residual distention of the stomach and proximal small bowel. An enteric tube is present with tip just below the level of the EG junction. Advancement of 4-5 cm may be useful. 4. Small bilateral pleural effusions with basilar atelectasis. 5. Additional incidental findings as above. Electronically Signed   By: WLucienne CapersM.D.   On: 03/04/2021 19:17   DG Chest Port 1 View  Result Date: 03/04/2021 CLINICAL DATA:  Shortness of breath and chest pain. EXAM: PORTABLE CHEST 1 VIEW COMPARISON:  03/03/2021 FINDINGS: 0945 hours. The lungs are clear without focal pneumonia, edema, pneumothorax or pleural effusion. The cardiopericardial silhouette is within normal limits for size. NG tube tip is at or just below the GE junction with proximal side port of the NG tube in the distal esophagus. Right PICC line tip overlies the distal SVC near the junction with the RA. IMPRESSION: 1. No acute cardiopulmonary findings. 2. NG tube tip is at or just below the GE junction with proximal side port in the distal  esophagus. Electronically Signed   By: Misty Stanley M.D.   On: 03/04/2021 10:12    Cardiac Studies   Echocardiogram 02/23/2021:  1. Left ventricular ejection fraction, by estimation, is 60 to 65%. The  left ventricle has normal function. The left ventricle has no regional  wall motion abnormalities. There is moderate left ventricular hypertrophy  of the basal-septal segment. Left  ventricular diastolic parameters were normal.   2. Right ventricular systolic function is normal. The right ventricular  size is normal. Tricuspid regurgitation signal is inadequate for assessing  PA pressure.   3. The mitral valve is normal in structure.  Trivial mitral valve  regurgitation. No evidence of mitral stenosis.   4. The aortic valve is normal in structure. Aortic valve regurgitation is  not visualized. No aortic stenosis is present.  Assessment & Plan    1.  Paroxysmal atrial fibrillation with RVR, maintaining sinus rhythm on IV amiodarone.  Also on Lopressor per tube.  CHA2DS2-VASc score is 3, currently on IV heparin.  2.  Acute sigmoid diverticulitis with contained perforation and abscess now postop day 7 status post exploratory laparotomy with sigmoid colectomy, end colostomy with enterorrhaphy and drainage of intra-abdominal abscess.  Still with bowel rest and NG tube in place.  Surgical service following.  He is receiving some medications per tube, tolerating so far.  Would continue IV amiodarone through today and attempt transition to 200 mg twice daily per tube tomorrow.  Once more completely stable, may then transition from IV heparin to DOAC.  Signed, Rozann Lesches, MD  03/05/2021, 8:54 AM

## 2021-03-05 NOTE — Progress Notes (Signed)
Brief ID note:  Repeat CT obtained yesterday shows a heterogeneous left iliopsoas collection possibly representing abscess or hematoma.  It appears that the previous diverticular abscess has resolved.  He still has evidence of ileus.  IR has been consulted to see if the psoas fluid collection is amenable to drainage.  Tentative plan had been to continue piperacillin tazobactam until 9/20 along with doxycycline (epididymitis coverage).  Urine GC/CT, HIV, RPR is pending.  -Continue piperacillin tazobactam  -Likely will need course extended beyond 9/20 given the new CT findings concerning for iliopsoas abscess -Follow-up IR plans for possible drainage and cultures.   Raynelle Highland for Infectious Disease [facility] 03/05/2021, 10:17 AM

## 2021-03-05 NOTE — Progress Notes (Signed)
ANTICOAGULATION CONSULT NOTE - Follow Up Consult  Pharmacy Consult for Heparin Indication: atrial fibrillation  Allergies  Allergen Reactions   Atorvastatin Other (See Comments)    myalgias    Patient Measurements: Height: 6' (182.9 cm) Weight: 82.6 kg (182 lb) IBW/kg (Calculated) : 77.6 Heparin Dosing Weight: 79.4 kg  Vital Signs: Temp: 97.7 F (36.5 C) (09/17 1606) Temp Source: Oral (09/17 1606) BP: 151/61 (09/17 1606) Pulse Rate: 89 (09/17 1606)  Labs: Recent Labs    03/03/21 0546 03/03/21 0549 03/04/21 0940 03/04/21 0941 03/04/21 1120 03/04/21 1529 03/05/21 0129 03/05/21 1011 03/05/21 1625  HGB 9.3*  --  7.8*  --  8.7*  --  8.7*  --   --   HCT 28.2*  --  24.3*  --  25.5*  --  25.3*  --   --   PLT 407*  --  356  --  383  --  397  --   --   HEPARINUNFRC  --    < >  --    < > <0.10*   < > 0.14* 0.27* 0.34  CREATININE 0.83  --   --   --  0.86  --  0.89  --   --    < > = values in this interval not displayed.     Estimated Creatinine Clearance: 84.8 mL/min (by C-G formula based on SCr of 0.89 mg/dL).   Assessment: 70 y/o male admitted for sepsis from contained perforated diverticulitis with active ileus. On PTA Xarelto for Afib with last dose on 9/2 at 2000.  Pharmacy consulted for IV heparin. -heparin level at goal at 1850 units/hr   Goal of Therapy:  Heparin level Goal: 0.3-0.5 units/mL Monitor platelets by anticoagulation protocol: Yes   Plan:  Continue heparin 1850 units/hr. Daily HL and CBC  Thank you for allowing pharmacy to participate in this patient's care.  Hildred Laser, PharmD Clinical Pharmacist **Pharmacist phone directory can now be found on [email or web address]).  Listed under [place].

## 2021-03-05 NOTE — Progress Notes (Signed)
PHARMACY - TOTAL PARENTERAL NUTRITION CONSULT NOTE   Indication: Prolonged ileus  Patient Measurements: Height: 6' (182.9 cm) Weight: 82.6 kg (182 lb) IBW/kg (Calculated) : 77.6 TPN AdjBW (KG): 82.6 Body mass index is 24.68 kg/m.  Assessment: 37 YOM with lower abdominal pain/N/V found to have perforated diverticulitis. CT abd on 9/3 found to have active ileus/pSBO from diverticulitis/bowel inflammation. Pharmacy consulted to start TPN for prolonged ileus. Of note patient has a h/o alcohol dependency and is at high risk for refeeding.   Glucose / Insulin: no hx DM, CBGs 130-150s. SSI/24h: 10 units Electrolytes: Na 128, K 4.7 (goal >/=4 with ileus), Mg 2.1 (goal >/=2 with ileus), CoCa 10.3, others WNL Renal: SCr <1 stable, BUN WNL Hepatic: LFTs / Tbili / TG wnl, Albumin 1.6 Intake / Output; MIVF: NG 834m, Drain output 2041m none from colostomy, UOP 1.1 ml/kg/hr - Consulting IR to see if psoas fluid is drainable GI Imaging:  - 9/3 CT abd: Active ileus/PSBO - 9/16 CT abd: L-iliopsoas fluid collection (abscess or hematoma), resolution of prior diverticular abscess, still w/ evidence of ileus GI Surgeries / Procedures:  - 9/10 ex-lap, sigmoid colectomy w/ end colostomy creation, enterorrhaphy, drainage of intra-abd abscess, wound vac placement  Central access: PICC line 9/8 >>  TPN start date: 9/9>>   Nutritional Goals: Goal TPN rate is 85 mL/hr (provides 102 g of protein and 2104 kcals per day)  RD Assessment: Estimated Needs Total Energy Estimated Needs: 2000-2200 Total Protein Estimated Needs: 100-110 grams Total Fluid Estimated Needs: >2L  Current Nutrition:  TPN; NPO  Plan:  Continue TPN at goal rate 8520mr at 1800 Electrolytes in TPN: incr Na to 125 mEq/L, K 24m25m, Ca 5mEq28m Mg 9mEq/24mand Phos 20mmol25mCl:Ac 1:1 Add standard MVI and trace elements to TPN Will add FA and thiamine to TPN bag Continue Moderate q4h SSI and adjust as needed Monitor standard TPN labs  Mon/Thurs and PRN (next 9/17)  Thank you for allowing pharmacy to be a part of this patient's care.  ElizabeAlycia RossettiD, BCPS Clinical Pharmacist Clinical phone for 03/05/2021: [phone] 7:51 AM   **Pharmacist phone directory can now be found on [email or web address]).  [place]ed [place] [facility] [place]

## 2021-03-05 NOTE — Progress Notes (Signed)
PROGRESS NOTE        PATIENT DETAILS Name: Travis Conrad Age: 70 y.o. Sex: male Date of Birth: 05-13-1951 Admit Date: 02/19/2021 Admitting Physician Etta Quill, DO PCP:Sun, [name], MD  Brief Narrative: Patient is a 70 y.o. male A. fib, HTN, HLD, prior diverticulitis, heavy EtOH use-who presented with lower abdominal pain/nausea/vomiting-on initial presentation to the ED found to have severe sepsis physiology with hypotension/AKI-felt to be due to perforated diverticulitis.  See below for further details.  Significant events: 9/3>> presented with lower abdominal pain/vomiting-sepsis from contained perforated diverticulitis.  Found to have very active ileus/PSBO from diverticulitis/bowel inflammation 9/4>> A. fib with RVR-not responding to Lopressor/Cardizem-BP soft-started amiodarone infusion-cardiology consulted. 9/7>> increase in size of pelvic abscess on repeat CT. 9/8>> CT-guided drainage of pelvic abscess 9/9>> right testicular pain/swelling-scrotal ultrasound with Doppler pending.  Significant studies: 9/3>> CT abdomen/pelvis: Acute sigmoid diverticulitis-small extraluminal gas-fluid collection in the sigmoid mesocolon consistent with abscess.  Dilated proximal small bowel-likely ileus. 9/7>> CT abdomen/pelvis: Abscess has increased in size 9/7>> Echo: EF 60-65%. 9/16 >> CT scan suggestive of possible left psoas abscess  Antimicrobial therapy: Zosyn: 9/3>>  Microbiology data: 9/3>> COVID/influenza PCR: Negative 9/3>> blood culture: Negative 9/8>> culture pelvic abscess: Pending.  Procedures : 9/8>> CT-guided placement of 12 French drainage catheter into pelvic collection. 9/10>> s/p ex lap, sigmoid colectomy, end colostomy with enterorrhaphy and drainage of intraabdominal abscess - Dr. Kieth Brightly and Dr. Radene Knee 9/10  Consults: General surgery Cardiology IR Urology ID  DVT Prophylaxis : SCDs Start: 02/20/21 0002>> Heparin  drip.   Subjective: Patient in bed, appears comfortable, denies any headache, no fever, no chest pain or pressure, no shortness of breath , mild abdominal & scrotal pain. No new focal weakness.   Assessment/Plan:  Severe sepsis due to diverticulitis with contained perforation post laparotomy with colectomy on 02/26/2021 now evidence of left psoas abscess: Sepsis physiology has resolved-although continues to have leukocytosis - remains on Zosyn.  Underwent CT- guided drainage of pelvic abscess on 02/25/21 followed by ex.Laprotomy, sigmoid colectomy, end colostomy with enterorrhaphy and drainage of intraabdominal abscess - Dr. Kieth Brightly and Dr. Radene Knee  on 02/26/2021, continue TNA, NG tube to intermittent suction, CT noted on 03/04/21 - possible psoas abscess/fluid collection - Defer to CCS/IR.   Ileus: Now postop ileus continue NG tube, see #1 above.    PAF with RVR: RVR provoked by sepsis/alcohol withdrawal-currently in sinus rhythm-remains on amiodarone infusion added PO Lopressor 02/27/21 .  On IV heparin >> to Xarelto once GI issues resolve .  Cardiology following.     Right scrotal pain and swelling due to epididymitis : Has been placed on doxycycline in addition to Zosyn, symptoms improving continue to monitor, denies any exposure or unprotected sex in the last few years, no unusual discharge.  Uro & ID input appreciated, will change Doxy  stop date 03/08/21.  AKI: Hemodynamically mediated-improved-creatinine has now normalized.  Hypokalemia/hypophosphatemia: Repleted and has normalized.  Continue to follow periodically.  Alcohol withdrawal: Awake and alert this morning-last drink was on 9/2, counseled to quit alcohol, no DTs, resolved.  HTN: Placed on oral beta-blocker, Norvasc added Catapres + PRN Hydralazine, pain control and monitor.  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   Diet: Diet Order  Diet NPO time specified Except for: Sips with Meds  Diet effective now                    Code Status:  Full code  Family Communication:  Spouse-Jimmi Malacara-[phone] updated 03/02/21  Disposition Plan:  Status is: Inpatient  Remains inpatient appropriate because:Inpatient level of care appropriate due to severity of illness  Dispo: The patient is from: Home              Anticipated d/c is to: Home              Patient currently is not medically stable to d/c.   Difficult to place patient No   Barriers to Discharge: Perforated diverticulitis with abscess-on IV antibiotics-developing alcohol withdrawal symptoms/PAF with RVR.  Antimicrobial agents: Anti-infectives (From admission, onward)    Start     Dose/Rate Route Frequency Ordered Stop   02/26/21 0100  doxycycline (VIBRAMYCIN) 100 mg in sodium chloride 0.9 % 250 mL IVPB        100 mg 125 mL/hr over 120 Minutes Intravenous 2 times daily 02/26/21 0005 03/08/21 2359   02/20/21 0300  piperacillin-tazobactam (ZOSYN) IVPB 3.375 g        3.375 g 12.5 mL/hr over 240 Minutes Intravenous Every 8 hours 02/19/21 1907 03/08/21 2359   02/19/21 1900  ceFEPIme (MAXIPIME) 2 g in sodium chloride 0.9 % 100 mL IVPB  Status:  Discontinued        2 g 200 mL/hr over 30 Minutes Intravenous  Once 02/19/21 1857 02/19/21 1906   02/19/21 1900  metroNIDAZOLE (FLAGYL) IVPB 500 mg  Status:  Discontinued        500 mg 100 mL/hr over 60 Minutes Intravenous  Once 02/19/21 1857 02/19/21 1906   02/19/21 1815  piperacillin-tazobactam (ZOSYN) IVPB 3.375 g        3.375 g 100 mL/hr over 30 Minutes Intravenous  Once 02/19/21 1809 02/19/21 1944        Time spent: 35 minutes-Greater than 50% of this time was spent in counseling, explanation of diagnosis, planning of further management, and coordination of care.  MEDICATIONS: Scheduled Meds:  amLODipine  10 mg Per Tube Daily   Chlorhexidine Gluconate Cloth  6 each Topical Daily   cloNIDine   0.1 mg Per Tube BID   folic acid  1 mg Per Tube Daily   insulin aspart  0-15 Units Subcutaneous Q4H   lidocaine  1 patch Transdermal Q24H   melatonin  3 mg Per Tube QHS   metoprolol tartrate  100 mg Per Tube BID   nicotine  21 mg Transdermal Daily   pantoprazole (PROTONIX) IV  40 mg Intravenous Q24H   sodium chloride flush  10 mL Intracatheter Q12H   thiamine  100 mg Per Tube Daily   Or   thiamine  100 mg Intravenous Daily   urea  15 g Per Tube BID   Continuous Infusions:  sodium chloride Stopped (03/05/21 0320)   amiodarone 30 mg/hr (03/05/21 0400)   doxycycline (VIBRAMYCIN) IV 100 mg (03/05/21 0936)   heparin 1,800 Units/hr (03/05/21 0525)   methocarbamol (ROBAXIN) IV 1,000 mg (03/05/21 0527)   piperacillin-tazobactam (ZOSYN)  IV 12.5 mL/hr at 03/05/21 0400   TPN ADULT (ION) 85 mL/hr at 03/05/21 0400   PRN Meds:.sodium chloride, butamben-tetracaine-benzocaine, hydrALAZINE, menthol-cetylpyridinium, metoprolol tartrate, morphine injection, [DISCONTINUED] ondansetron **OR** ondansetron (ZOFRAN) IV, oxyCODONE, phenol, zolpidem   PHYSICAL EXAM: Vital signs: Vitals:   03/05/21 0021 03/05/21 0413 03/05/21 0746 03/05/21  0800  BP: (!) 141/60 121/61 138/60 (!) 148/64  Pulse: 87 88 87 87  Resp: '20 18 18 20  '$ Temp: 98.2 F (36.8 C) 98.5 F (36.9 C) 98.4 F (36.9 C) 98.3 F (36.8 C)  TempSrc: Oral Axillary Axillary Oral  SpO2: 98% 96% 98% 99%  Weight:      Height:       Filed Weights   02/19/21 1800 02/20/21 2000 02/26/21 0913  Weight: 79.4 kg 79.4 kg 82.6 kg   Body mass index is 24.68 kg/m.   Exam  Awake Alert, No new F.N deficits, NG in place, colostomy in place, scrotal swelling slowly improving [place].AT,PERRAL Supple Neck,No JVD, No cervical lymphadenopathy appriciated.  Symmetrical Chest wall movement, Good air movement bilaterally, CTAB RRR,No Gallops, Rubs or new Murmurs, No Parasternal Heave Hypoactive B.Sounds, Abd mildly tender No Cyanosis, Clubbing or edema, No  new Rash or bruise    I have personally reviewed following labs and imaging studies  LABORATORY DATA:  Recent Labs  Lab 03/01/21 1901 03/02/21 0659 03/03/21 0546 03/04/21 0940 03/04/21 1120 03/05/21 0129  WBC 14.7* 13.5* 17.1* 15.5* 19.1* 21.6*  HGB 11.2* 10.8* 9.3* 7.8* 8.7* 8.7*  HCT 33.9* 32.3* 28.2* 24.3* 25.5* 25.3*  PLT 432* 409* 407* 356 383 397  MCV 103.4* 102.5* 103.3* 112.0* 102.8* 100.4*  MCH 34.1* 34.3* 34.1* 35.9* 35.1* 34.5*  MCHC 33.0 33.4 33.0 32.1 34.1 34.4  RDW 14.1 14.4 14.3 15.5 14.3 14.3  LYMPHSABS 2.5  --   --   --   --   --   MONOABS 1.3*  --   --   --   --   --   EOSABS 0.0  --   --   --   --   --   BASOSABS 0.1  --   --   --   --   --     Recent Labs  Lab 02/27/21 0322 02/28/21 0440 03/01/21 0441 03/03/21 0546 03/04/21 0940 03/04/21 1120 03/05/21 0129  NA 134* 135 134* 132*  --  131* 128*  K 4.1 4.0 4.2 4.7  --  4.9 4.7  CL 101 101 102 101  --  101 97*  CO2 '26 26 24 24  '$ --  23 21*  GLUCOSE 191* 128* 147* 178*  --  111* 166*  BUN '13 13 15 16  '$ --  17 17  CREATININE 0.91 0.97 0.82 0.83  --  0.86 0.89  CALCIUM 8.1* 8.3* 8.0* 8.1*  --  7.9* 8.4*  AST  --  17  --  15  --  12* 12*  ALT  --  10  --  11  --  9 11  ALKPHOS  --  50  --  53  --  60 68  BILITOT  --  0.4  --  0.3  --  0.5 0.5  ALBUMIN  --  1.7*  --  1.6*  --  1.6* 1.6*  MG 1.9 1.9 2.0 2.1  --   --  2.1  PROCALCITON  --   --   --  <0.10 41.97 <0.10 <0.10     RADIOLOGY STUDIES/RESULTS: CT ABDOMEN PELVIS W CONTRAST  Result Date: 03/04/2021 CLINICAL DATA:  Abdominal distension EXAM: CT ABDOMEN AND PELVIS WITH CONTRAST TECHNIQUE: Multidetector CT imaging of the abdomen and pelvis was performed using the standard protocol following bolus administration of intravenous contrast. CONTRAST:  129m OMNIPAQUE IOHEXOL 350 MG/ML SOLN COMPARISON:  Abdominal radiograph 02/26/2021. CT abdomen and pelvis 02/23/2021  FINDINGS: Lower chest: Small bilateral pleural effusions with basilar atelectasis,  slightly greater on the right. Enteric tube in place with tip just below the EG junction. Coronary artery calcifications. Hepatobiliary: No focal liver abnormality is seen. No gallstones, gallbladder wall thickening, or biliary dilatation. Pancreas: Unremarkable. No pancreatic ductal dilatation or surrounding inflammatory changes. Spleen: Normal in size without focal abnormality. Adrenals/Urinary Tract: Subcentimeter left adrenal gland nodule, unchanged. No follow-up is indicated. Kidneys are symmetrical in size with symmetrical nephrograms. Prominent stranding in the left pararenal fat, increasing since prior study. This could indicate infection or contusion. No hydronephrosis or hydroureter. Bladder is unremarkable. Stomach/Bowel: Stomach is mildly distended with gas and contrast material. No wall thickening. Small duodenal diverticulum. Proximal jejunal loops are moderately distended with decompression of the distal small bowel suggesting probable bowel obstruction versus ileus. Contrast material is demonstrated in the colon suggesting there is no complete obstruction. Since the prior CT study, there is interval placement of a left lower quadrant diverting colostomy. The colon is decompressed. Left lower quadrant drain is present. The previous pelvic abscess has resolved. However, along the left iliopsoas muscle, there is a new heterogeneous collection measuring 5.6 x 9.1 cm in diameter. Fluid density level is present suggesting that this represents hemorrhagic fluid. This could represent a postoperative collection or abscess. Associated stranding along the retroperitoneal fat. Vascular/Lymphatic: Calcification of the aorta. No aneurysm. No significant lymphadenopathy. Reproductive: Prostate gland is enlarged. Limited inclusion of the scrotal contents with suggestion of scrotal fluid. Other: Small amount of free fluid in the pelvis is likely reactive. Scarring along the midline consistent with postoperative  change. Musculoskeletal: Degenerative change fall fine. No destructive bone lesions. IMPRESSION: 1. Large heterogeneous left iliopsoas collection may represent abscess, hematoma, or postoperative fluid collection. Stranding in the retroperitoneum and left pararenal fat as well as subcutaneous tissues could be inflammatory or contusion. 2. Interval postoperative changes with left lower quadrant colostomy and left lower quadrant pelvic drainage catheter. The previous diverticular abscess has resolved. 3. Mild residual distention of the stomach and proximal small bowel. An enteric tube is present with tip just below the level of the EG junction. Advancement of 4-5 cm may be useful. 4. Small bilateral pleural effusions with basilar atelectasis. 5. Additional incidental findings as above. Electronically Signed   By: Lucienne Capers M.D.   On: 03/04/2021 19:17   DG Chest Port 1 View  Result Date: 03/04/2021 CLINICAL DATA:  Shortness of breath and chest pain. EXAM: PORTABLE CHEST 1 VIEW COMPARISON:  03/03/2021 FINDINGS: 0945 hours. The lungs are clear without focal pneumonia, edema, pneumothorax or pleural effusion. The cardiopericardial silhouette is within normal limits for size. NG tube tip is at or just below the GE junction with proximal side port of the NG tube in the distal esophagus. Right PICC line tip overlies the distal SVC near the junction with the RA. IMPRESSION: 1. No acute cardiopulmonary findings. 2. NG tube tip is at or just below the GE junction with proximal side port in the distal esophagus. Electronically Signed   By: Misty Stanley M.D.   On: 03/04/2021 10:12     LOS: 13 days   Signature  Lala Lund M.D on 03/05/2021 at 10:08 AM   -  To page go to [email or web address]    03/05/2021, 10:08 AM

## 2021-03-05 NOTE — Plan of Care (Signed)
  Problem: Health Behavior/Discharge Planning: Goal: Ability to manage health-related needs will improve Outcome: Progressing   Problem: Clinical Measurements: Goal: Ability to maintain clinical measurements within normal limits will improve Outcome: Progressing Goal: Will remain free from infection Outcome: Progressing Goal: Diagnostic test results will improve Outcome: Progressing Goal: Respiratory complications will improve Outcome: Progressing Goal: Cardiovascular complication will be avoided Outcome: Progressing   Problem: Activity: Goal: Risk for activity intolerance will decrease Outcome: Progressing   Problem: Nutrition: Goal: Adequate nutrition will be maintained Outcome: Progressing   Problem: Coping: Goal: Level of anxiety will decrease Outcome: Progressing   Problem: Elimination: Goal: Will not experience complications related to bowel motility Outcome: Progressing Goal: Will not experience complications related to urinary retention Outcome: Progressing   Problem: Pain Managment: Goal: General experience of comfort will improve Outcome: Progressing   Problem: Safety: Goal: Ability to remain free from injury will improve Outcome: Progressing   Problem: Skin Integrity: Goal: Risk for impaired skin integrity will decrease Outcome: Progressing   Problem: Education: Goal: Knowledge of disease or condition will improve Outcome: Progressing Goal: Understanding of medication regimen will improve Outcome: Progressing Goal: Individualized Educational Video(s) Outcome: Progressing   Problem: Activity: Goal: Ability to tolerate increased activity will improve Outcome: Progressing   Problem: Cardiac: Goal: Ability to achieve and maintain adequate cardiopulmonary perfusion will improve Outcome: Progressing   Problem: Health Behavior/Discharge Planning: Goal: Ability to safely manage health-related needs after discharge will improve Outcome: Progressing    Problem: Education: Goal: Required Educational Video(s) Outcome: Progressing   Problem: Clinical Measurements: Goal: Ability to maintain clinical measurements within normal limits will improve Outcome: Progressing Goal: Postoperative complications will be avoided or minimized Outcome: Progressing   Problem: Skin Integrity: Goal: Demonstration of wound healing without infection will improve Outcome: Progressing

## 2021-03-05 NOTE — Progress Notes (Signed)
Physical Therapy Treatment Patient Details Name: Travis Conrad MRN: [id number] DOB: 1951/02/24 Today's Date: 03/05/2021   History of Present Illness 70 yo admitted 9/3 with abdominal pain, vomiting and ileus. 9/4 Afib with RVR. 9/8 intrabdominal abscess s/p IR drain placed. 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 Pt found to have lt iliopsoas hematoma on CT 9/16.  PMhx: HTN, HLD, Afib, ETOH use    PT Comments    Pt making slow progress with mobility. Continues to amb in hallway with assist and pt fatigues quickly. As pt improves medically and his pain improves expect mobility will improve as well.    Recommendations for follow up therapy are one component of a multi-disciplinary discharge planning process, led by the attending physician.  Recommendations may be updated based on patient status, additional functional criteria and insurance authorization.  Follow Up Recommendations  Home health PT;Supervision for mobility/OOB     Equipment Recommendations  None recommended by PT    Recommendations for Other Services       Precautions / Restrictions Precautions Precautions: Fall;Other (comment) Precaution Comments: wound vAC, jp drain, NGtube     Mobility  Bed Mobility Overal bed mobility: Needs Assistance Bed Mobility: Rolling;Sidelying to Sit;Sit to Sidelying Rolling: Min assist Sidelying to sit: Mod assist     Sit to sidelying: Min assist General bed mobility comments: Assist to elevate trunk into sitting. Assist to bring legs back up into bed returning to sidelying    Transfers Overall transfer level: Needs assistance Equipment used: Rolling walker (2 wheeled) Transfers: Sit to/from Stand Sit to Stand: Min assist;From elevated surface         General transfer comment: Assist to bring hips up. Verbal cues for hand placement  Ambulation/Gait Ambulation/Gait assistance: Min guard Gait Distance (Feet): 130  Feet Assistive device: Rolling walker (2 wheeled) Gait Pattern/deviations: Trunk flexed;Decreased stride length;Step-through pattern Gait velocity: decr Gait velocity interpretation: <1.31 ft/sec, indicative of household ambulator General Gait Details: Assist for safety and lines. Verbal cues to stay closer to walker on turns.   Stairs             Wheelchair Mobility    Modified Rankin (Stroke Patients Only)       Balance Overall balance assessment: Needs assistance Sitting-balance support: No upper extremity supported Sitting balance-Leahy Scale: Fair     Standing balance support: Bilateral upper extremity supported Standing balance-Leahy Scale: Poor Standing balance comment: walker and min guard for static standing                            Cognition Arousal/Alertness: Awake/alert Behavior During Therapy: WFL for tasks assessed/performed Overall Cognitive Status: Within Functional Limits for tasks assessed                                        Exercises      General Comments        Pertinent Vitals/Pain Pain Assessment: Faces Faces Pain Scale: Hurts even more Pain Location: abdomen Pain Descriptors / Indicators: Aching;Guarding Pain Intervention(s): Limited activity within patient's tolerance;Monitored during session;Repositioned    Home Living                      Prior Function            PT Goals (current goals  can now be found in the care plan section) Acute Rehab PT Goals Patient Stated Goal: return home and be able to walk Progress towards PT goals: Progressing toward goals    Frequency    Min 3X/week      PT Plan Current plan remains appropriate    Co-evaluation              AM-PAC PT "6 Clicks" Mobility   Outcome Measure  Help needed turning from your back to your side while in a flat bed without using bedrails?: A Little Help needed moving from lying on your back to sitting on the  side of a flat bed without using bedrails?: A Lot Help needed moving to and from a bed to a chair (including a wheelchair)?: A Little Help needed standing up from a chair using your arms (e.g., wheelchair or bedside chair)?: A Little Help needed to walk in hospital room?: A Little Help needed climbing 3-5 steps with a railing? : A Lot 6 Click Score: 16    End of Session   Activity Tolerance: Patient tolerated treatment well Patient left: in bed;with call bell/phone within reach;with family/visitor present Nurse Communication: Mobility status       Time: [id number] PT Time Calculation (min) (ACUTE ONLY): 31 min  Charges:  $Gait Training: 23-37 mins                     Doyle Pager [phone] Office [place] 03/05/2021, 4:25 PM

## 2021-03-05 NOTE — Progress Notes (Signed)
Request to IR for possible left iliopsoas aspiration/drain placement -- imaging reviewed today by Dr. Anselm Pancoast who notes this collection to consistent with a hematoma and does not recommend percutaneous aspiration/drain placement at this time.  Information above was relayed to Dr. Kae Heller via Epic secure chat today.  IR remains available as needed, please call with questions or concerns.  Candiss Norse, PA-C

## 2021-03-05 NOTE — Progress Notes (Signed)
ANTICOAGULATION CONSULT NOTE - Follow Up Consult  Pharmacy Consult for Heparin Indication: atrial fibrillation  Allergies  Allergen Reactions   Atorvastatin Other (See Comments)    myalgias    Patient Measurements: Height: 6' (182.9 cm) Weight: 82.6 kg (182 lb) IBW/kg (Calculated) : 77.6 Heparin Dosing Weight: 79.4 kg  Vital Signs: Temp: 98.3 F (36.8 C) (09/17 0800) Temp Source: Oral (09/17 0800) BP: 148/64 (09/17 0800) Pulse Rate: 87 (09/17 0800)  Labs: Recent Labs    03/03/21 0546 03/03/21 0549 03/04/21 0940 03/04/21 0941 03/04/21 1120 03/04/21 1529 03/05/21 0129 03/05/21 1011  HGB 9.3*  --  7.8*  --  8.7*  --  8.7*  --   HCT 28.2*  --  24.3*  --  25.5*  --  25.3*  --   PLT 407*  --  356  --  383  --  397  --   HEPARINUNFRC  --    < >  --    < > <0.10* 0.16* 0.14* 0.27*  CREATININE 0.83  --   --   --  0.86  --  0.89  --    < > = values in this interval not displayed.     Estimated Creatinine Clearance: 84.8 mL/min (by C-G formula based on SCr of 0.89 mg/dL).   Assessment: 70 y/o male admitted for sepsis from contained perforated diverticulitis with active ileus. On PTA Xarelto for Afib with last dose on 9/2 at 2000.  Pharmacy consulted for IV heparin.  Repeat CT 9/7 with increased size of abscess, s/p IR drain 9/8. Surgery ordered to stop heparin drip MN 9/9 in prep for surgery on 9/10. Heparin was stopped at midnight on 9/9. Heparin levels have been subtherapeutic around 0.2s and the rate was increased before holding for surgery.   Heparin IV currently running at 1800 units/mL. No issues noted with infusion. No new s/s of bleeding noted by nurse.   Heparin level 0.27, sub-therapeutic  Goal of Therapy:  Heparin level Goal: 0.3-0.5 units/mL Monitor platelets by anticoagulation protocol: Yes   Plan:  Increase IV heparin to 1850 units/hr. Repeat heparin level with AM labs. Daily HL and CBC Monitor for signs and symptoms of bleeding.  Thank you for  allowing pharmacy to participate in this patient's care.  Levonne Spiller, PharmD PGY1 Acute Care Resident  03/05/2021,11:17 AM

## 2021-03-05 NOTE — Progress Notes (Signed)
Patient complaining of increased sore throat.  CT scan showed NG required advancement of approximately 5 cm.  Pt nauseated and vomiting.  NG seen curled in posterior throat.  NG re-inserted and now draining without pain.  Vomiting ceased.  Low int. Suction draining bilious material.

## 2021-03-05 NOTE — Progress Notes (Signed)
ANTIBIOTIC CONSULT NOTE - Follow Up Consult  Pharmacy Consult for  Zosyn Indication: intra-abdominal infection  Allergies  Allergen Reactions   Atorvastatin Other (See Comments)    myalgias    Patient Measurements: Height: 6' (182.9 cm) Weight: 82.6 kg (182 lb) IBW/kg (Calculated) : 77.6 Heparin Dosing Weight: 82.6 kg  Vital Signs: Temp: 97.8 F (36.6 C) (09/17 1154) Temp Source: Axillary (09/17 1154) BP: 125/60 (09/17 1154) Pulse Rate: 78 (09/17 1154)  Labs: Recent Labs    03/03/21 0546 03/03/21 0549 03/04/21 0940 03/04/21 0941 03/04/21 1120 03/04/21 1529 03/05/21 0129 03/05/21 1011  HGB 9.3*  --  7.8*  --  8.7*  --  8.7*  --   HCT 28.2*  --  24.3*  --  25.5*  --  25.3*  --   PLT 407*  --  356  --  383  --  397  --   HEPARINUNFRC  --    < >  --    < > <0.10* 0.16* 0.14* 0.27*  CREATININE 0.83  --   --   --  0.86  --  0.89  --    < > = values in this interval not displayed.     Estimated Creatinine Clearance: 84.8 mL/min (by C-G formula based on SCr of 0.89 mg/dL).  Assessment: 70 y/o male admitted for sepsis from contained perforated diverticulitis with active ileus. Pharmacy consulted for zosyn dosing with plans to continue until at least 03/08/21.   From CT 9/16, previous diverticular abscess resolved. May suggest new finding for iliopsoas abscess. IR has been consulted to place drain, but did not feel it was able to be drained or aspirated at this time. Also on doxycycline for epididymitis coverage.   Plan: . Continue Zosyn 3.375 gm IV q8h (each over 4 hours). Also on Doxycycline 100 mg IV q12h. Monitor renal function and clinical progress F/u plans for drainage and culture results  Thank you for allowing pharmacy to participate in this patient's care.  Levonne Spiller, PharmD PGY1 Acute Care Resident  03/05/2021,12:42 PM

## 2021-03-05 NOTE — Progress Notes (Addendum)
[facility] Progress Note  7 Days Post-Op  Subjective: CC- abdominal pain Feels pretty good except for sore throat from his NG tube.  Objective: Vital signs in last 24 hours: Temp:  [98.2 F (36.8 C)-99.3 F (37.4 C)] 98.3 F (36.8 C) (09/17 0800) Pulse Rate:  [75-88] 87 (09/17 0800) Resp:  [18-21] 20 (09/17 0800) BP: (121-151)/(52-64) 148/64 (09/17 0800) SpO2:  [96 %-99 %] 99 % (09/17 0800) Last BM Date: 02/22/21  Intake/Output from previous day: 09/16 0701 - 09/17 0700 In: 5972.1 [I.V.:4901.2; IV Piggyback:1070.9] Out: 3755 [Urine:2200; Emesis/NG output:1000; Drains:555] Intake/Output this shift: Total I/O In: -  Out: 300 [Urine:300]  PE: Gen:  Alert, NAD, pleasant Pulm:  Normal rate and effort. CTAB Abd: Soft, mild distension, mild diffuse TTP without rebound or guarding greatest on left abdomen near ostomy and drain.  Drain w/ SS output. Colostomy is pink, appears patent and healthy, small amount of dark bloody drainage. NGT w/ bilious output. vac to midline incision with good seal MSK: no calf TTP bilaterally Skin: no rashes noted, warm and dry  Lab Results:  Recent Labs    03/04/21 1120 03/05/21 0129  WBC 19.1* 21.6*  HGB 8.7* 8.7*  HCT 25.5* 25.3*  PLT 383 397    BMET Recent Labs    03/04/21 1120 03/05/21 0129  NA 131* 128*  K 4.9 4.7  CL 101 97*  CO2 23 21*  GLUCOSE 111* 166*  BUN 17 17  CREATININE 0.86 0.89  CALCIUM 7.9* 8.4*    PT/INR No results for input(s): LABPROT, INR in the last 72 hours. CMP     Component Value Date/Time   NA 128 (L) 03/05/2021 0129   NA 134 09/30/2019 0959   NA 142 03/03/2016 0921   K 4.7 03/05/2021 0129   K 4.3 03/03/2016 0921   CL 97 (L) 03/05/2021 0129   CO2 21 (L) 03/05/2021 0129   CO2 25 03/03/2016 0921   GLUCOSE 166 (H) 03/05/2021 0129   GLUCOSE 89 03/03/2016 0921   BUN 17 03/05/2021 0129   BUN 11 09/30/2019 0959   BUN 13.6 03/03/2016 0921   CREATININE 0.89 03/05/2021 0129    CREATININE 1.2 03/03/2016 0921   CALCIUM 8.4 (L) 03/05/2021 0129   CALCIUM 9.6 03/03/2016 0921   PROT 5.1 (L) 03/05/2021 0129   PROT 7.3 03/03/2016 0921   ALBUMIN 1.6 (L) 03/05/2021 0129   ALBUMIN 3.8 03/03/2016 0921   AST 12 (L) 03/05/2021 0129   AST 36 (H) 03/03/2016 0921   ALT 11 03/05/2021 0129   ALT 31 03/03/2016 0921   ALKPHOS 68 03/05/2021 0129   ALKPHOS 116 03/03/2016 0921   BILITOT 0.5 03/05/2021 0129   BILITOT 0.71 03/03/2016 0921   GFRNONAA >60 03/05/2021 0129   GFRNONAA 67 07/25/2013 1712   GFRAA >60 10/03/2019 0930   GFRAA 77 07/25/2013 1712   Lipase     Component Value Date/Time   LIPASE 22 02/19/2021 1653       Studies/Results: CT ABDOMEN PELVIS W CONTRAST  Result Date: 03/04/2021 CLINICAL DATA:  Abdominal distension EXAM: CT ABDOMEN AND PELVIS WITH CONTRAST TECHNIQUE: Multidetector CT imaging of the abdomen and pelvis was performed using the standard protocol following bolus administration of intravenous contrast. CONTRAST:  147m OMNIPAQUE IOHEXOL 350 MG/ML SOLN COMPARISON:  Abdominal radiograph 02/26/2021. CT abdomen and pelvis 02/23/2021 FINDINGS: Lower chest: Small bilateral pleural effusions with basilar atelectasis, slightly greater on the right. Enteric tube in place with tip just below the EG junction. Coronary  artery calcifications. Hepatobiliary: No focal liver abnormality is seen. No gallstones, gallbladder wall thickening, or biliary dilatation. Pancreas: Unremarkable. No pancreatic ductal dilatation or surrounding inflammatory changes. Spleen: Normal in size without focal abnormality. Adrenals/Urinary Tract: Subcentimeter left adrenal gland nodule, unchanged. No follow-up is indicated. Kidneys are symmetrical in size with symmetrical nephrograms. Prominent stranding in the left pararenal fat, increasing since prior study. This could indicate infection or contusion. No hydronephrosis or hydroureter. Bladder is unremarkable. Stomach/Bowel: Stomach is mildly  distended with gas and contrast material. No wall thickening. Small duodenal diverticulum. Proximal jejunal loops are moderately distended with decompression of the distal small bowel suggesting probable bowel obstruction versus ileus. Contrast material is demonstrated in the colon suggesting there is no complete obstruction. Since the prior CT study, there is interval placement of a left lower quadrant diverting colostomy. The colon is decompressed. Left lower quadrant drain is present. The previous pelvic abscess has resolved. However, along the left iliopsoas muscle, there is a new heterogeneous collection measuring 5.6 x 9.1 cm in diameter. Fluid density level is present suggesting that this represents hemorrhagic fluid. This could represent a postoperative collection or abscess. Associated stranding along the retroperitoneal fat. Vascular/Lymphatic: Calcification of the aorta. No aneurysm. No significant lymphadenopathy. Reproductive: Prostate gland is enlarged. Limited inclusion of the scrotal contents with suggestion of scrotal fluid. Other: Small amount of free fluid in the pelvis is likely reactive. Scarring along the midline consistent with postoperative change. Musculoskeletal: Degenerative change fall fine. No destructive bone lesions. IMPRESSION: 1. Large heterogeneous left iliopsoas collection may represent abscess, hematoma, or postoperative fluid collection. Stranding in the retroperitoneum and left pararenal fat as well as subcutaneous tissues could be inflammatory or contusion. 2. Interval postoperative changes with left lower quadrant colostomy and left lower quadrant pelvic drainage catheter. The previous diverticular abscess has resolved. 3. Mild residual distention of the stomach and proximal small bowel. An enteric tube is present with tip just below the level of the EG junction. Advancement of 4-5 cm may be useful. 4. Small bilateral pleural effusions with basilar atelectasis. 5. Additional  incidental findings as above. Electronically Signed   By: Lucienne Capers M.D.   On: 03/04/2021 19:17   DG Chest Port 1 View  Result Date: 03/04/2021 CLINICAL DATA:  Shortness of breath and chest pain. EXAM: PORTABLE CHEST 1 VIEW COMPARISON:  03/03/2021 FINDINGS: 0945 hours. The lungs are clear without focal pneumonia, edema, pneumothorax or pleural effusion. The cardiopericardial silhouette is within normal limits for size. NG tube tip is at or just below the GE junction with proximal side port of the NG tube in the distal esophagus. Right PICC line tip overlies the distal SVC near the junction with the RA. IMPRESSION: 1. No acute cardiopulmonary findings. 2. NG tube tip is at or just below the GE junction with proximal side port in the distal esophagus. Electronically Signed   By: Misty Stanley M.D.   On: 03/04/2021 10:12    Anti-infectives: Anti-infectives (From admission, onward)    Start     Dose/Rate Route Frequency Ordered Stop   02/26/21 0100  doxycycline (VIBRAMYCIN) 100 mg in sodium chloride 0.9 % 250 mL IVPB        100 mg 125 mL/hr over 120 Minutes Intravenous 2 times daily 02/26/21 0005 03/08/21 2359   02/20/21 0300  piperacillin-tazobactam (ZOSYN) IVPB 3.375 g        3.375 g 12.5 mL/hr over 240 Minutes Intravenous Every 8 hours 02/19/21 1907 03/08/21 2359   02/19/21 1900  ceFEPIme (MAXIPIME) 2 g in sodium chloride 0.9 % 100 mL IVPB  Status:  Discontinued        2 g 200 mL/hr over 30 Minutes Intravenous  Once 02/19/21 1857 02/19/21 1906   02/19/21 1900  metroNIDAZOLE (FLAGYL) IVPB 500 mg  Status:  Discontinued        500 mg 100 mL/hr over 60 Minutes Intravenous  Once 02/19/21 1857 02/19/21 1906   02/19/21 1815  piperacillin-tazobactam (ZOSYN) IVPB 3.375 g        3.375 g 100 mL/hr over 30 Minutes Intravenous  Once 02/19/21 1809 02/19/21 1944        Assessment/Plan POD 7 s/p ex lap, sigmoid colectomy, end colostomy with enterorrhaphy and drainage of intraabdominal abscess -  Dr. Kieth Brightly and Dr. Radene Knee 9/10 for Acute sigmoid diverticulitis with contained perforation and abscess   CT 9/16 demonstrates heterogenous left iliopsoas collection which may represent abscess or hematoma, stranding in the retroperitoneum and subcu tissue.  Distal colon appears decompressed and very small caliber but there is contrast entering this area; there is additionally a similarly small-caliber segment of colon in the proximal transverse and the ascending colon is not dilated- suspect over all this represents small bowel/gastric ileus.  Will consult IR to see if the psoas fluid collection is drainable.  Advance NG tube 5 cm. - still with ileus - cont drain  - Wound vac MWF.  - WOCN - Await bowel function and keep NGT to LIWS today - Cont TPN - encouraged ambulation, use IS, multimodal pain control  - PT rec HH   FEN - NGT LIWS, NPO sips/chips, IVF, TPN VTE - heparin gtt ID - zosyn 9/4>>, doxycycline (orchitis). WBC 15.5 (17.1) afebrile Foley - out   Per primary: A fib AKI - resolved ABL anemia -  no hypotension or tachycardia, monitor HTN ETOH withdrawal   Addendum- psoas fluid suspected hematoma per Dr. Anselm Pancoast, holding off on drainage at this time.    LOS: 4 days    Clovis Riley, [facility] 03/05/2021, 8:49 AM Please see Amion for pager number during day hours 7:00am-4:30pm

## 2021-03-05 NOTE — Progress Notes (Signed)
03/05/2021 Travis Conrad (Med Tech) from rapid response main lab called  at 1410 the Serum Osmolality that was collected on 03/04/2021 at 1529 result was reported as 276. It was reported to RN the result was actually 277 and will be corrected in the computer. Dr Candiss Norse was made aware. Rico Sheehan RN

## 2021-03-06 DIAGNOSIS — I482 Chronic atrial fibrillation, unspecified: Secondary | ICD-10-CM | POA: Diagnosis not present

## 2021-03-06 DIAGNOSIS — I480 Paroxysmal atrial fibrillation: Secondary | ICD-10-CM | POA: Diagnosis not present

## 2021-03-06 LAB — CBC
HCT: 22.1 % — ABNORMAL LOW (ref 39.0–52.0)
Hemoglobin: 7.7 g/dL — ABNORMAL LOW (ref 13.0–17.0)
MCH: 35.2 pg — ABNORMAL HIGH (ref 26.0–34.0)
MCHC: 34.8 g/dL (ref 30.0–36.0)
MCV: 100.9 fL — ABNORMAL HIGH (ref 80.0–100.0)
Platelets: 368 10*3/uL (ref 150–400)
RBC: 2.19 MIL/uL — ABNORMAL LOW (ref 4.22–5.81)
RDW: 14.4 % (ref 11.5–15.5)
WBC: 19.2 10*3/uL — ABNORMAL HIGH (ref 4.0–10.5)
nRBC: 0 % (ref 0.0–0.2)

## 2021-03-06 LAB — GLUCOSE, CAPILLARY
Glucose-Capillary: 135 mg/dL — ABNORMAL HIGH (ref 70–99)
Glucose-Capillary: 138 mg/dL — ABNORMAL HIGH (ref 70–99)
Glucose-Capillary: 140 mg/dL — ABNORMAL HIGH (ref 70–99)
Glucose-Capillary: 148 mg/dL — ABNORMAL HIGH (ref 70–99)
Glucose-Capillary: 150 mg/dL — ABNORMAL HIGH (ref 70–99)
Glucose-Capillary: 150 mg/dL — ABNORMAL HIGH (ref 70–99)

## 2021-03-06 LAB — HEPARIN LEVEL (UNFRACTIONATED): Heparin Unfractionated: 0.29 IU/mL — ABNORMAL LOW (ref 0.30–0.70)

## 2021-03-06 LAB — BASIC METABOLIC PANEL
Anion gap: 7 (ref 5–15)
BUN: 28 mg/dL — ABNORMAL HIGH (ref 8–23)
CO2: 21 mmol/L — ABNORMAL LOW (ref 22–32)
Calcium: 8 mg/dL — ABNORMAL LOW (ref 8.9–10.3)
Chloride: 102 mmol/L (ref 98–111)
Creatinine, Ser: 1.01 mg/dL (ref 0.61–1.24)
GFR, Estimated: >60 mL/min (ref >60)
Glucose, Bld: 145 mg/dL — ABNORMAL HIGH (ref 70–99)
Potassium: 4.8 mmol/L (ref 3.5–5.1)
Sodium: 130 mmol/L — ABNORMAL LOW (ref 135–145)

## 2021-03-06 LAB — PROCALCITONIN: Procalcitonin: <0.1 ng/mL

## 2021-03-06 MED ORDER — METOCLOPRAMIDE HCL 5 MG/ML IJ SOLN
10.0000 mg | Freq: Four times a day (QID) | INTRAMUSCULAR | Status: DC
  Administered 2021-03-06 – 2021-04-05 (×121): 10 mg via INTRAVENOUS
  Filled 2021-03-06 (×118): qty 2

## 2021-03-06 MED ORDER — AMIODARONE HCL 200 MG PO TABS
200.0000 mg | ORAL_TABLET | Freq: Two times a day (BID) | ORAL | Status: DC
  Administered 2021-03-06 – 2021-03-15 (×19): 200 mg via NASOGASTRIC
  Filled 2021-03-06 (×20): qty 1

## 2021-03-06 MED ORDER — TRAVASOL 10 % IV SOLN
INTRAVENOUS | Status: AC
  Filled 2021-03-06: qty 1020

## 2021-03-06 NOTE — Plan of Care (Signed)
  Problem: Health Behavior/Discharge Planning: Goal: Ability to manage health-related needs will improve Outcome: Progressing   Problem: Clinical Measurements: Goal: Ability to maintain clinical measurements within normal limits will improve Outcome: Progressing Goal: Will remain free from infection Outcome: Progressing Goal: Diagnostic test results will improve Outcome: Progressing Goal: Respiratory complications will improve Outcome: Progressing Goal: Cardiovascular complication will be avoided Outcome: Progressing   Problem: Activity: Goal: Risk for activity intolerance will decrease Outcome: Progressing   Problem: Nutrition: Goal: Adequate nutrition will be maintained Outcome: Progressing   Problem: Coping: Goal: Level of anxiety will decrease Outcome: Progressing   Problem: Elimination: Goal: Will not experience complications related to bowel motility Outcome: Progressing Goal: Will not experience complications related to urinary retention Outcome: Progressing   Problem: Pain Managment: Goal: General experience of comfort will improve Outcome: Progressing   Problem: Safety: Goal: Ability to remain free from injury will improve Outcome: Progressing   Problem: Skin Integrity: Goal: Risk for impaired skin integrity will decrease Outcome: Progressing   Problem: Education: Goal: Knowledge of disease or condition will improve Outcome: Progressing Goal: Understanding of medication regimen will improve Outcome: Progressing Goal: Individualized Educational Video(s) Outcome: Progressing   Problem: Activity: Goal: Ability to tolerate increased activity will improve Outcome: Progressing   Problem: Cardiac: Goal: Ability to achieve and maintain adequate cardiopulmonary perfusion will improve Outcome: Progressing   Problem: Health Behavior/Discharge Planning: Goal: Ability to safely manage health-related needs after discharge will improve Outcome: Progressing    Problem: Education: Goal: Required Educational Video(s) Outcome: Progressing   Problem: Clinical Measurements: Goal: Ability to maintain clinical measurements within normal limits will improve Outcome: Progressing Goal: Postoperative complications will be avoided or minimized Outcome: Progressing   Problem: Skin Integrity: Goal: Demonstration of wound healing without infection will improve Outcome: Progressing

## 2021-03-06 NOTE — Progress Notes (Signed)
[facility] Progress Note  8 Days Post-Op  Subjective: CC- abdominal pain A little more abdominal pain today, sore throat from NG  Objective: Vital signs in last 24 hours: Temp:  [97.7 F (36.5 C)-98.4 F (36.9 C)] 97.8 F (36.6 C) (09/18 0741) Pulse Rate:  [72-89] 74 (09/18 0816) Resp:  [18-31] 20 (09/18 0816) BP: (99-151)/(46-64) 120/50 (09/18 0816) SpO2:  [98 %-100 %] 100 % (09/18 0816) Weight:  [85.6 kg] 85.6 kg (09/18 0348) Last BM Date: 02/22/21  Intake/Output from previous day: 09/17 0701 - 09/18 0700 In: 4172.7 [I.V.:3095.5; IV Piggyback:1077.3] Out: 2725 [Urine:900; Emesis/NG output:1700; Drains:125] Intake/Output this shift: Total I/O In: 10 [I.V.:10] Out: 200 [Urine:200]  PE: Gen:  Alert, NAD, pleasant Pulm:  Normal rate and effort. CTAB Abd: Soft, mild distension, mild diffuse TTP without rebound or guarding greatest on left abdomen near ostomy and drain.  Drain w/ SS output. Colostomy is pink, appears patent and healthy, small amount of serous drainage. NGT w/ bilious output. vac to midline incision with good seal MSK: no calf TTP bilaterally Skin: no rashes noted, warm and dry  Lab Results:  Recent Labs    03/05/21 0129 03/06/21 0131  WBC 21.6* 19.2*  HGB 8.7* 7.7*  HCT 25.3* 22.1*  PLT 397 368    BMET Recent Labs    03/04/21 1120 03/05/21 0129  NA 131* 128*  K 4.9 4.7  CL 101 97*  CO2 23 21*  GLUCOSE 111* 166*  BUN 17 17  CREATININE 0.86 0.89  CALCIUM 7.9* 8.4*    PT/INR No results for input(s): LABPROT, INR in the last 72 hours. CMP     Component Value Date/Time   NA 128 (L) 03/05/2021 0129   NA 134 09/30/2019 0959   NA 142 03/03/2016 0921   K 4.7 03/05/2021 0129   K 4.3 03/03/2016 0921   CL 97 (L) 03/05/2021 0129   CO2 21 (L) 03/05/2021 0129   CO2 25 03/03/2016 0921   GLUCOSE 166 (H) 03/05/2021 0129   GLUCOSE 89 03/03/2016 0921   BUN 17 03/05/2021 0129   BUN 11 09/30/2019 0959   BUN 13.6 03/03/2016 0921    CREATININE 0.89 03/05/2021 0129   CREATININE 1.2 03/03/2016 0921   CALCIUM 8.4 (L) 03/05/2021 0129   CALCIUM 9.6 03/03/2016 0921   PROT 5.1 (L) 03/05/2021 0129   PROT 7.3 03/03/2016 0921   ALBUMIN 1.6 (L) 03/05/2021 0129   ALBUMIN 3.8 03/03/2016 0921   AST 12 (L) 03/05/2021 0129   AST 36 (H) 03/03/2016 0921   ALT 11 03/05/2021 0129   ALT 31 03/03/2016 0921   ALKPHOS 68 03/05/2021 0129   ALKPHOS 116 03/03/2016 0921   BILITOT 0.5 03/05/2021 0129   BILITOT 0.71 03/03/2016 0921   GFRNONAA >60 03/05/2021 0129   GFRNONAA 67 07/25/2013 1712   GFRAA >60 10/03/2019 0930   GFRAA 77 07/25/2013 1712   Lipase     Component Value Date/Time   LIPASE 22 02/19/2021 1653       Studies/Results: DG Abd 1 View  Result Date: 03/05/2021 CLINICAL DATA: Tube placement. EXAM: ABDOMEN - 1 VIEW COMPARISON:  Abdominal x-ray 03/05/2021. FINDINGS: Enteric tube is coiled in the proximal stomach with distal tip just beyond the gastroesophageal junction. Dilated small bowel in the upper abdomen persists compatible with small bowel obstruction versus ileus. Lung bases are grossly clear. IMPRESSION: 1. Enteric tube tip coiled in the proximal stomach. Electronically Signed   By: Ronney Asters M.D.   On:  03/05/2021 15:55   CT ABDOMEN PELVIS W CONTRAST  Result Date: 03/04/2021 CLINICAL DATA:  Abdominal distension EXAM: CT ABDOMEN AND PELVIS WITH CONTRAST TECHNIQUE: Multidetector CT imaging of the abdomen and pelvis was performed using the standard protocol following bolus administration of intravenous contrast. CONTRAST:  167m OMNIPAQUE IOHEXOL 350 MG/ML SOLN COMPARISON:  Abdominal radiograph 02/26/2021. CT abdomen and pelvis 02/23/2021 FINDINGS: Lower chest: Small bilateral pleural effusions with basilar atelectasis, slightly greater on the right. Enteric tube in place with tip just below the EG junction. Coronary artery calcifications. Hepatobiliary: No focal liver abnormality is seen. No gallstones, gallbladder  wall thickening, or biliary dilatation. Pancreas: Unremarkable. No pancreatic ductal dilatation or surrounding inflammatory changes. Spleen: Normal in size without focal abnormality. Adrenals/Urinary Tract: Subcentimeter left adrenal gland nodule, unchanged. No follow-up is indicated. Kidneys are symmetrical in size with symmetrical nephrograms. Prominent stranding in the left pararenal fat, increasing since prior study. This could indicate infection or contusion. No hydronephrosis or hydroureter. Bladder is unremarkable. Stomach/Bowel: Stomach is mildly distended with gas and contrast material. No wall thickening. Small duodenal diverticulum. Proximal jejunal loops are moderately distended with decompression of the distal small bowel suggesting probable bowel obstruction versus ileus. Contrast material is demonstrated in the colon suggesting there is no complete obstruction. Since the prior CT study, there is interval placement of a left lower quadrant diverting colostomy. The colon is decompressed. Left lower quadrant drain is present. The previous pelvic abscess has resolved. However, along the left iliopsoas muscle, there is a new heterogeneous collection measuring 5.6 x 9.1 cm in diameter. Fluid density level is present suggesting that this represents hemorrhagic fluid. This could represent a postoperative collection or abscess. Associated stranding along the retroperitoneal fat. Vascular/Lymphatic: Calcification of the aorta. No aneurysm. No significant lymphadenopathy. Reproductive: Prostate gland is enlarged. Limited inclusion of the scrotal contents with suggestion of scrotal fluid. Other: Small amount of free fluid in the pelvis is likely reactive. Scarring along the midline consistent with postoperative change. Musculoskeletal: Degenerative change fall fine. No destructive bone lesions. IMPRESSION: 1. Large heterogeneous left iliopsoas collection may represent abscess, hematoma, or postoperative fluid  collection. Stranding in the retroperitoneum and left pararenal fat as well as subcutaneous tissues could be inflammatory or contusion. 2. Interval postoperative changes with left lower quadrant colostomy and left lower quadrant pelvic drainage catheter. The previous diverticular abscess has resolved. 3. Mild residual distention of the stomach and proximal small bowel. An enteric tube is present with tip just below the level of the EG junction. Advancement of 4-5 cm may be useful. 4. Small bilateral pleural effusions with basilar atelectasis. 5. Additional incidental findings as above. Electronically Signed   By: WLucienne CapersM.D.   On: 03/04/2021 19:17   DG Chest Port 1 View  Result Date: 03/04/2021 CLINICAL DATA:  Shortness of breath and chest pain. EXAM: PORTABLE CHEST 1 VIEW COMPARISON:  03/03/2021 FINDINGS: 0945 hours. The lungs are clear without focal pneumonia, edema, pneumothorax or pleural effusion. The cardiopericardial silhouette is within normal limits for size. NG tube tip is at or just below the GE junction with proximal side port of the NG tube in the distal esophagus. Right PICC line tip overlies the distal SVC near the junction with the RA. IMPRESSION: 1. No acute cardiopulmonary findings. 2. NG tube tip is at or just below the GE junction with proximal side port in the distal esophagus. Electronically Signed   By: EMisty StanleyM.D.   On: 03/04/2021 10:12   DG Abd Portable  1V  Result Date: 03/05/2021 CLINICAL DATA:  Nasogastric tube placement EXAM: PORTABLE ABDOMEN - 1 VIEW COMPARISON:  02/26/2021 FINDINGS: Interval advancement of nasogastric tube which is now coiled in the proximal stomach. Limited visualization upper small bowel shows persistent gaseous distension consistent with ileus or partial small bowel obstruction. IMPRESSION: Nasogastric tube is coiled in the proximal stomach. Electronically Signed   By: Aletta Edouard M.D.   On: 03/05/2021 11:45     Anti-infectives: Anti-infectives (From admission, onward)    Start     Dose/Rate Route Frequency Ordered Stop   02/26/21 0100  doxycycline (VIBRAMYCIN) 100 mg in sodium chloride 0.9 % 250 mL IVPB        100 mg 125 mL/hr over 120 Minutes Intravenous 2 times daily 02/26/21 0005 03/08/21 2359   02/20/21 0300  piperacillin-tazobactam (ZOSYN) IVPB 3.375 g        3.375 g 12.5 mL/hr over 240 Minutes Intravenous Every 8 hours 02/19/21 1907 03/08/21 2359   02/19/21 1900  ceFEPIme (MAXIPIME) 2 g in sodium chloride 0.9 % 100 mL IVPB  Status:  Discontinued        2 g 200 mL/hr over 30 Minutes Intravenous  Once 02/19/21 1857 02/19/21 1906   02/19/21 1900  metroNIDAZOLE (FLAGYL) IVPB 500 mg  Status:  Discontinued        500 mg 100 mL/hr over 60 Minutes Intravenous  Once 02/19/21 1857 02/19/21 1906   02/19/21 1815  piperacillin-tazobactam (ZOSYN) IVPB 3.375 g        3.375 g 100 mL/hr over 30 Minutes Intravenous  Once 02/19/21 1809 02/19/21 1944        Assessment/Plan POD 8 s/p ex lap, sigmoid colectomy, end colostomy with enterorrhaphy and drainage of intraabdominal abscess - Dr. Kieth Brightly and Dr. Radene Knee 9/10 for Acute sigmoid diverticulitis with contained perforation and abscess   CT 9/16 demonstrates heterogenous left iliopsoas collection likely hematomaa, stranding in the retroperitoneum and subcu tissue.  Distal colon appears decompressed and very small caliber but there is contrast entering this area; there is additionally a similarly small-caliber segment of colon in the proximal transverse and the ascending colon is not dilated- suspect over all this represents small bowel/gastric ileus.  IR does not recommend draining the hematoma at this point.   - still with ileus. Continue NG. Will try reglan.  - cont drain  - Wound vac MWF.  - WOCN - Await bowel function and keep NGT to LIWS today - Cont TPN - encouraged ambulation, use IS, multimodal pain control  - PT rec HH   FEN - NGT LIWS,  NPO sips/chips, IVF, TPN VTE - heparin gtt ID - zosyn 9/4>>, doxycycline (orchitis). WBC 15.5 (17.1) afebrile Foley - out   Per primary: A fib AKI - resolved ABL anemia -  no hypotension or tachycardia, monitor HTN ETOH withdrawal     LOS: 14 days    Clovis Riley, MD [name] 03/06/2021, 8:47 AM Please see Amion for pager number during day hours 7:00am-4:30pm

## 2021-03-06 NOTE — Progress Notes (Signed)
Brief ID note:  IR reviewed updated CT scan and felt that iliopsoas collection was more consistent with hematoma and does not recommend percutaneous aspiration or drain placement at this time.  Continue current antibiotics tentatively planned through 03/08/2021.  Dr. Gale Journey will be back tomorrow.    Raynelle Highland for [facility] 03/06/2021, 9:13 AM

## 2021-03-06 NOTE — Progress Notes (Signed)
ANTICOAGULATION CONSULT NOTE - Follow Up Consult  Pharmacy Consult for Heparin Indication: atrial fibrillation  Allergies  Allergen Reactions   Atorvastatin Other (See Comments)    myalgias    Patient Measurements: Height: 6' (182.9 cm) Weight: 85.6 kg (188 lb 11.4 oz) IBW/kg (Calculated) : 77.6 Heparin Dosing Weight: 79.4 kg  Vital Signs: Temp: 98.3 F (36.8 C) (09/18 0348) Temp Source: Oral (09/18 0348) BP: 109/46 (09/18 0400) Pulse Rate: 72 (09/18 0400)  Labs: Recent Labs    03/04/21 1120 03/04/21 1529 03/05/21 0129 03/05/21 1011 03/05/21 1625 03/06/21 0131  HGB 8.7*  --  8.7*  --   --  7.7*  HCT 25.5*  --  25.3*  --   --  22.1*  PLT 383  --  397  --   --  368  HEPARINUNFRC <0.10*   < > 0.14* 0.27* 0.34 0.29*  CREATININE 0.86  --  0.89  --   --   --    < > = values in this interval not displayed.     Estimated Creatinine Clearance: 84.8 mL/min (by C-G formula based on SCr of 0.89 mg/dL).   Assessment: 70 y/o male admitted for sepsis from contained perforated diverticulitis with active ileus. On PTA Xarelto for Afib with last dose on 9/2 at 2000.  Pharmacy consulted for IV heparin.  Heparin IV currently running at 1850 units/mL. No s/s of bleeding. No Infusion issues  Heparin level 0.29, slightly sub-therapeutic  Goal of Therapy:  Heparin level Goal: 0.3-0.5 units/mL Monitor platelets by anticoagulation protocol: Yes   Plan:  Increase IV heparin to 1900 units/hr. Obtain heparin level in 6 hours Daily Heparin level and CBC Monitor for signs and symptoms of bleeding.  Thank you for allowing pharmacy to participate in this patient's care.  Levonne Spiller, PharmD PGY1 Acute Care Resident  03/06/2021,7:03 AM

## 2021-03-06 NOTE — Progress Notes (Addendum)
   Progress Note  Patient Name: Travis Conrad Date of Encounter: 03/06/2021  Primary Cardiologist: [name], MD  Chart reviewed since around yesterday.  Patient remains in sinus rhythm by telemetry.  He is afebrile, systolic blood pressure 99991111 range, heart rate in the 70s.  Still with NG tube in place.  Pertinent lab work includes potassium 4.8, creatinine 1.01, hemoglobin 7.7.  Medications reviewed below.  Inpatient Medications    Scheduled Meds:  amLODipine  10 mg Per Tube Daily   Chlorhexidine Gluconate Cloth  6 each Topical Daily   cloNIDine  0.1 mg Per Tube BID   insulin aspart  0-15 Units Subcutaneous Q4H   lidocaine  1 patch Transdermal Q24H   melatonin  3 mg Per Tube QHS   metoCLOPramide (REGLAN) injection  10 mg Intravenous Q6H   metoprolol tartrate  100 mg Per Tube BID   nicotine  21 mg Transdermal Daily   pantoprazole (PROTONIX) IV  40 mg Intravenous Q24H   sodium chloride flush  10 mL Intracatheter Q12H   Continuous Infusions:  sodium chloride Stopped (03/05/21 0320)   amiodarone 30 mg/hr (03/06/21 0552)   doxycycline (VIBRAMYCIN) IV 100 mg (03/06/21 0826)   heparin 1,900 Units/hr (03/06/21 0839)   methocarbamol (ROBAXIN) IV 200 mL/hr at 03/06/21 0552   piperacillin-tazobactam (ZOSYN)  IV 12.5 mL/hr at 03/06/21 0552   TPN ADULT (ION) 85 mL/hr at 03/06/21 0552   TPN ADULT (ION)     Plan to discontinue IV amiodarone and initiate amiodarone 200 mg twice daily per tube.  Would still hold off on transition from heparin to [facility] until he is more clinically stable and no other procedures are contemplated.  In fact, if heparin needs to be held in the short-term due to concerns about iliopsoas hematoma and downward drift in hemoglobin, that is certainly reasonable.  Signed, Rozann Lesches, MD  03/06/2021, 9:43 AM

## 2021-03-06 NOTE — Progress Notes (Signed)
PHARMACY - TOTAL PARENTERAL NUTRITION CONSULT NOTE   Indication: Prolonged ileus  Patient Measurements: Height: 6' (182.9 cm) Weight: 85.6 kg (188 lb 11.4 oz) IBW/kg (Calculated) : 77.6 TPN AdjBW (KG): 82.6 Body mass index is 25.59 kg/m.  Assessment: 33 YOM with lower abdominal pain/N/V found to have perforated diverticulitis. CT abd on 9/3 found to have active ileus/pSBO from diverticulitis/bowel inflammation. Pharmacy consulted to start TPN for prolonged ileus. Of note patient has a h/o alcohol dependency and is at high risk for refeeding.   Glucose / Insulin: no hx DM, CBGs 130-140s. SSI/24h: 13 units Electrolytes: Na 128>130, K 4.8 (goal >/=4 with ileus), Mg 2.1 (goal >/=2 with ileus), others wnl Renal: SCr 1.01 stable, BUN 28 Hepatic: LFTs / Tbili / TG wnl, Albumin 1.6 Intake / Output; MIVF: NG inc to 1732m, Drain output 1241m none from colostomy, UOP 1.1>0.4 ml/kg/hr - IR consulted to see if psoas fluid is drainable GI Imaging:  - 9/3 CT abd: Active ileus/PSBO - 9/16 CT abd: L-iliopsoas fluid collection (abscess or hematoma), resolution of prior diverticular abscess, still w/ evidence of ileus GI Surgeries / Procedures:  - 9/10 ex-lap, sigmoid colectomy w/ end colostomy creation, enterorrhaphy, drainage of intra-abd abscess, wound vac placement  Central access: PICC line 9/8 >>  TPN start date: 9/9>>   Nutritional Goals: Goal TPN rate is 85 mL/hr (provides 102 g of protein and 2104 kcals per day)  RD Assessment: Estimated Needs Total Energy Estimated Needs: 2000-2200 Total Protein Estimated Needs: 100-110 grams Total Fluid Estimated Needs: >2L  Current Nutrition:  TPN; NPO  Plan:  Continue TPN at goal rate 8549mr at 1800 Electrolytes in TPN: incr Na to 150 mEq/L, dec K to 75m77m, Ca 5mEq101m Mg 9mEq/74mand Phos 20mmol52mCl:Ac 1:1 Add standard MVI and trace elements, FA and thiamine to TPN Continue Moderate q4h SSI and adjust as needed TPN labs in AM  JonathaBertis RuddyD Clinical Pharmacist ED Pharmacist Phone # [phone] 7:26 AM

## 2021-03-06 NOTE — Progress Notes (Addendum)
PROGRESS NOTE        PATIENT DETAILS Name: Travis Conrad Age: 70 y.o. Sex: male Date of Birth: 09/25/1950 Admit Date: 02/19/2021 Admitting Physician Etta Quill, DO PCP:Sun, [name], MD  Brief Narrative: Patient is a 70 y.o. male A. fib, HTN, HLD, prior diverticulitis, heavy EtOH use-who presented with lower abdominal pain/nausea/vomiting-on initial presentation to the ED found to have severe sepsis physiology with hypotension/AKI-felt to be due to perforated diverticulitis.  See below for further details.  Significant events: 9/3>> presented with lower abdominal pain/vomiting-sepsis from contained perforated diverticulitis.  Found to have very active ileus/PSBO from diverticulitis/bowel inflammation 9/4>> A. fib with RVR-not responding to Lopressor/Cardizem-BP soft-started amiodarone infusion-cardiology consulted. 9/7>> increase in size of pelvic abscess on repeat CT. 9/8>> CT-guided drainage of pelvic abscess 9/9>> right testicular pain/swelling-scrotal ultrasound with Doppler pending.  Significant studies: 9/3>> CT abdomen/pelvis: Acute sigmoid diverticulitis-small extraluminal gas-fluid collection in the sigmoid mesocolon consistent with abscess.  Dilated proximal small bowel-likely ileus. 9/7>> CT abdomen/pelvis: Abscess has increased in size 9/7>> Echo: EF 60-65%. 9/16 >> CT scan suggestive of possible left psoas abscess  Antimicrobial therapy: Zosyn: 9/3>>  Microbiology data: 9/3>> COVID/influenza PCR: Negative 9/3>> blood culture: Negative 9/8>> culture pelvic abscess: Pending.  Procedures : 9/8>> CT-guided placement of 12 French drainage catheter into pelvic collection. 9/10>> s/p ex lap, sigmoid colectomy, end colostomy with enterorrhaphy and drainage of intraabdominal abscess - Dr. Kieth Brightly and Dr. Radene Knee 9/10  Consults: General surgery Cardiology IR Urology ID  DVT Prophylaxis : SCDs Start: 02/20/21 0002>> Heparin  drip.   Subjective: Patient in bed, appears comfortable, denies any headache, no fever, no chest pain or pressure, no shortness of breath , +ve  abdominal pain. No new focal weakness.    Assessment/Plan:  Severe sepsis due to diverticulitis with contained perforation post laparotomy with colectomy on 02/26/2021 now evidence of left psoas fluid collection: Sepsis physiology has resolved-although continues to have leukocytosis - remains on Zosyn.  Underwent CT- guided drainage of pelvic abscess on 02/25/21 followed by ex.Laprotomy, sigmoid colectomy, end colostomy with enterorrhaphy and drainage of intraabdominal abscess - Dr. Kieth Brightly and Dr. Radene Knee  on 02/26/2021, continue TNA, NG tube to intermittent suction, CT noted on 03/04/21 - possible psoas hematoma/fluid collection - Defer to CCS/IR.  Discussed with cardiology since he is in sinus rhythm and there is a question of hematoma in the psoas area will hold heparin for 24 hours on 03/07/2019.   Ileus: Now postop ileus continue NG tube, see #1 above.    PAF with RVR: RVR provoked by sepsis/alcohol withdrawal-currently in sinus rhythm- now on PO Amio + Lopressor & IV heparin >> to Xarelto once GI issues resolve .  Cardiology following.     Right scrotal pain and swelling due to epididymitis : Has been placed on doxycycline in addition to Zosyn, symptoms improving continue to monitor, denies any exposure or unprotected sex in the last few years, no unusual discharge.  Uro & ID input appreciated, will change Doxy  stop date 03/08/21.  AKI: Hemodynamically mediated-improved-creatinine has now normalized.  Hypokalemia/hypophosphatemia: Repleted and has normalized.  Continue to follow periodically.  Alcohol withdrawal: Awake and alert this morning-last drink was on 9/2, counseled to quit alcohol, no DTs, resolved.  HTN: Placed on oral beta-blocker + Norvasc,  DC Catapres, on PRN Hydralazine, pain control and monitor.  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   Diet: Diet Order             Diet NPO time specified Except for: Sips with Meds  Diet effective now                    Code Status:  Full code  Family Communication:  Spouse-Jimmi Wente-[phone] updated 03/02/21  Disposition Plan:  Status is: Inpatient  Remains inpatient appropriate because:Inpatient level of care appropriate due to severity of illness  Dispo: The patient is from: Home              Anticipated d/c is to: Home              Patient currently is not medically stable to d/c.   Difficult to place patient No   Barriers to Discharge: Perforated diverticulitis with abscess-on IV antibiotics-developing alcohol withdrawal symptoms/PAF with RVR.  Antimicrobial agents: Anti-infectives (From admission, onward)    Start     Dose/Rate Route Frequency Ordered Stop   02/26/21 0100  doxycycline (VIBRAMYCIN) 100 mg in sodium chloride 0.9 % 250 mL IVPB        100 mg 125 mL/hr over 120 Minutes Intravenous 2 times daily 02/26/21 0005 03/08/21 2359   02/20/21 0300  piperacillin-tazobactam (ZOSYN) IVPB 3.375 g        3.375 g 12.5 mL/hr over 240 Minutes Intravenous Every 8 hours 02/19/21 1907 03/08/21 2359   02/19/21 1900  ceFEPIme (MAXIPIME) 2 g in sodium chloride 0.9 % 100 mL IVPB  Status:  Discontinued        2 g 200 mL/hr over 30 Minutes Intravenous  Once 02/19/21 1857 02/19/21 1906   02/19/21 1900  metroNIDAZOLE (FLAGYL) IVPB 500 mg  Status:  Discontinued        500 mg 100 mL/hr over 60 Minutes Intravenous  Once 02/19/21 1857 02/19/21 1906   02/19/21 1815  piperacillin-tazobactam (ZOSYN) IVPB 3.375 g        3.375 g 100 mL/hr over 30 Minutes Intravenous  Once 02/19/21 1809 02/19/21 1944        Time spent: 35 minutes-Greater than 50% of this time was spent in counseling, explanation of diagnosis, planning of further management, and coordination of  care.  MEDICATIONS: Scheduled Meds:  amiodarone  200 mg Per NG tube BID   amLODipine  10 mg Per Tube Daily   Chlorhexidine Gluconate Cloth  6 each Topical Daily   cloNIDine  0.1 mg Per Tube BID   insulin aspart  0-15 Units Subcutaneous Q4H   lidocaine  1 patch Transdermal Q24H   melatonin  3 mg Per Tube QHS   metoCLOPramide (REGLAN) injection  10 mg Intravenous Q6H   metoprolol tartrate  100 mg Per Tube BID   nicotine  21 mg Transdermal Daily   pantoprazole (PROTONIX) IV  40 mg Intravenous Q24H   sodium chloride flush  10 mL Intracatheter Q12H   Continuous Infusions:  sodium chloride Stopped (03/05/21 0320)   doxycycline (VIBRAMYCIN) IV 100 mg (03/06/21 0826)   heparin 1,900 Units/hr (03/06/21 0839)   methocarbamol (ROBAXIN) IV 200 mL/hr at 03/06/21 0552   piperacillin-tazobactam (ZOSYN)  IV 3.375 g (03/06/21 1016)   TPN ADULT (ION) 85 mL/hr at 03/06/21 0552   TPN ADULT (ION)     PRN Meds:.sodium chloride, butamben-tetracaine-benzocaine, hydrALAZINE, menthol-cetylpyridinium, metoprolol tartrate, morphine injection, [DISCONTINUED] ondansetron **OR** ondansetron (ZOFRAN) IV, oxyCODONE, phenol, zolpidem   PHYSICAL EXAM: Vital signs:  Vitals:   03/06/21 0400 03/06/21 0741 03/06/21 0816 03/06/21 1021  BP: (!) 109/46 (!) 115/50 (!) 120/50 (!) 117/40  Pulse: 72 75 74   Resp: '18 20 20 18  '$ Temp:  97.8 F (36.6 C)    TempSrc:  Oral    SpO2: 100% 99% 100%   Weight:      Height:       Filed Weights   02/20/21 2000 02/26/21 0913 03/06/21 0348  Weight: 79.4 kg 82.6 kg 85.6 kg   Body mass index is 25.59 kg/m.   Exam  Awake Alert, No new F.N deficits, NG in place, colostomy in place, scrotal swelling slowly improving Valentine.AT,PERRAL Supple Neck,No JVD, No cervical lymphadenopathy appriciated.  Symmetrical Chest wall movement, Good air movement bilaterally, CTAB RRR,No Gallops, Rubs or new Murmurs, No Parasternal Heave Hypoactive bowel sounds, mildly tender abdomen No Cyanosis,  Clubbing or edema, No new Rash or bruise     I have personally reviewed following labs and imaging studies  LABORATORY DATA:  Recent Labs  Lab 03/01/21 1901 03/02/21 0659 03/03/21 0546 03/04/21 0940 03/04/21 1120 03/05/21 0129 03/06/21 0131  WBC 14.7*   < > 17.1* 15.5* 19.1* 21.6* 19.2*  HGB 11.2*   < > 9.3* 7.8* 8.7* 8.7* 7.7*  HCT 33.9*   < > 28.2* 24.3* 25.5* 25.3* 22.1*  PLT 432*   < > 407* 356 383 397 368  MCV 103.4*   < > 103.3* 112.0* 102.8* 100.4* 100.9*  MCH 34.1*   < > 34.1* 35.9* 35.1* 34.5* 35.2*  MCHC 33.0   < > 33.0 32.1 34.1 34.4 34.8  RDW 14.1   < > 14.3 15.5 14.3 14.3 14.4  LYMPHSABS 2.5  --   --   --   --   --   --   MONOABS 1.3*  --   --   --   --   --   --   EOSABS 0.0  --   --   --   --   --   --   BASOSABS 0.1  --   --   --   --   --   --    < > = values in this interval not displayed.    Recent Labs  Lab 02/28/21 0440 03/01/21 0441 03/03/21 0546 03/04/21 0940 03/04/21 1120 03/05/21 0129 03/06/21 0131 03/06/21 0732  NA 135 134* 132*  --  131* 128*  --  130*  K 4.0 4.2 4.7  --  4.9 4.7  --  4.8  CL 101 102 101  --  101 97*  --  102  CO2 '26 24 24  '$ --  23 21*  --  21*  GLUCOSE 128* 147* 178*  --  111* 166*  --  145*  BUN '13 15 16  '$ --  17 17  --  28*  CREATININE 0.97 0.82 0.83  --  0.86 0.89  --  1.01  CALCIUM 8.3* 8.0* 8.1*  --  7.9* 8.4*  --  8.0*  AST 17  --  15  --  12* 12*  --   --   ALT 10  --  11  --  9 11  --   --   ALKPHOS 50  --  53  --  60 68  --   --   BILITOT 0.4  --  0.3  --  0.5 0.5  --   --   ALBUMIN 1.7*  --  1.6*  --  1.6* 1.6*  --   --   MG 1.9 2.0 2.1  --   --  2.1  --   --   PROCALCITON  --   --  <0.10 41.97 <0.10 <0.10 <0.10  --      RADIOLOGY STUDIES/RESULTS: DG Abd 1 View  Result Date: 03/05/2021 CLINICAL DATA: Tube placement. EXAM: ABDOMEN - 1 VIEW COMPARISON:  Abdominal x-ray 03/05/2021. FINDINGS: Enteric tube is coiled in the proximal stomach with distal tip just beyond the gastroesophageal junction. Dilated  small bowel in the upper abdomen persists compatible with small bowel obstruction versus ileus. Lung bases are grossly clear. IMPRESSION: 1. Enteric tube tip coiled in the proximal stomach. Electronically Signed   By: Ronney Asters M.D.   On: 03/05/2021 15:55   CT ABDOMEN PELVIS W CONTRAST  Result Date: 03/04/2021 CLINICAL DATA:  Abdominal distension EXAM: CT ABDOMEN AND PELVIS WITH CONTRAST TECHNIQUE: Multidetector CT imaging of the abdomen and pelvis was performed using the standard protocol following bolus administration of intravenous contrast. CONTRAST:  170m OMNIPAQUE IOHEXOL 350 MG/ML SOLN COMPARISON:  Abdominal radiograph 02/26/2021. CT abdomen and pelvis 02/23/2021 FINDINGS: Lower chest: Small bilateral pleural effusions with basilar atelectasis, slightly greater on the right. Enteric tube in place with tip just below the EG junction. Coronary artery calcifications. Hepatobiliary: No focal liver abnormality is seen. No gallstones, gallbladder wall thickening, or biliary dilatation. Pancreas: Unremarkable. No pancreatic ductal dilatation or surrounding inflammatory changes. Spleen: Normal in size without focal abnormality. Adrenals/Urinary Tract: Subcentimeter left adrenal gland nodule, unchanged. No follow-up is indicated. Kidneys are symmetrical in size with symmetrical nephrograms. Prominent stranding in the left pararenal fat, increasing since prior study. This could indicate infection or contusion. No hydronephrosis or hydroureter. Bladder is unremarkable. Stomach/Bowel: Stomach is mildly distended with gas and contrast material. No wall thickening. Small duodenal diverticulum. Proximal jejunal loops are moderately distended with decompression of the distal small bowel suggesting probable bowel obstruction versus ileus. Contrast material is demonstrated in the colon suggesting there is no complete obstruction. Since the prior CT study, there is interval placement of a left lower quadrant diverting  colostomy. The colon is decompressed. Left lower quadrant drain is present. The previous pelvic abscess has resolved. However, along the left iliopsoas muscle, there is a new heterogeneous collection measuring 5.6 x 9.1 cm in diameter. Fluid density level is present suggesting that this represents hemorrhagic fluid. This could represent a postoperative collection or abscess. Associated stranding along the retroperitoneal fat. Vascular/Lymphatic: Calcification of the aorta. No aneurysm. No significant lymphadenopathy. Reproductive: Prostate gland is enlarged. Limited inclusion of the scrotal contents with suggestion of scrotal fluid. Other: Small amount of free fluid in the pelvis is likely reactive. Scarring along the midline consistent with postoperative change. Musculoskeletal: Degenerative change fall fine. No destructive bone lesions. IMPRESSION: 1. Large heterogeneous left iliopsoas collection may represent abscess, hematoma, or postoperative fluid collection. Stranding in the retroperitoneum and left pararenal fat as well as subcutaneous tissues could be inflammatory or contusion. 2. Interval postoperative changes with left lower quadrant colostomy and left lower quadrant pelvic drainage catheter. The previous diverticular abscess has resolved. 3. Mild residual distention of the stomach and proximal small bowel. An enteric tube is present with tip just below the level of the EG junction. Advancement of 4-5 cm may be useful. 4. Small bilateral pleural effusions with basilar atelectasis. 5. Additional incidental findings as above. Electronically Signed   By: WLucienne CapersM.D.   On: 03/04/2021 19:17   DG Abd Portable  1V  Result Date: 03/05/2021 CLINICAL DATA:  Nasogastric tube placement EXAM: PORTABLE ABDOMEN - 1 VIEW COMPARISON:  02/26/2021 FINDINGS: Interval advancement of nasogastric tube which is now coiled in the proximal stomach. Limited visualization upper small bowel shows persistent gaseous  distension consistent with ileus or partial small bowel obstruction. IMPRESSION: Nasogastric tube is coiled in the proximal stomach. Electronically Signed   By: Aletta Edouard M.D.   On: 03/05/2021 11:45     LOS: 14 days   Signature  Lala Lund M.D on 03/06/2021 at 10:43 AM   -  To page go to [email or web address]    03/06/2021, 10:43 AM

## 2021-03-07 ENCOUNTER — Inpatient Hospital Stay (HOSPITAL_COMMUNITY): Payer: Medicare Other

## 2021-03-07 DIAGNOSIS — K5780 Diverticulitis of intestine, part unspecified, with perforation and abscess without bleeding: Secondary | ICD-10-CM

## 2021-03-07 DIAGNOSIS — K5720 Diverticulitis of large intestine with perforation and abscess without bleeding: Secondary | ICD-10-CM | POA: Diagnosis not present

## 2021-03-07 DIAGNOSIS — I482 Chronic atrial fibrillation, unspecified: Secondary | ICD-10-CM | POA: Diagnosis not present

## 2021-03-07 DIAGNOSIS — K651 Peritoneal abscess: Secondary | ICD-10-CM | POA: Diagnosis not present

## 2021-03-07 DIAGNOSIS — N451 Epididymitis: Secondary | ICD-10-CM | POA: Diagnosis not present

## 2021-03-07 DIAGNOSIS — I480 Paroxysmal atrial fibrillation: Secondary | ICD-10-CM | POA: Diagnosis not present

## 2021-03-07 LAB — CBC
HCT: 19.8 % — ABNORMAL LOW (ref 39.0–52.0)
Hemoglobin: 6.7 g/dL — CL (ref 13.0–17.0)
MCH: 34.7 pg — ABNORMAL HIGH (ref 26.0–34.0)
MCHC: 33.8 g/dL (ref 30.0–36.0)
MCV: 102.6 fL — ABNORMAL HIGH (ref 80.0–100.0)
Platelets: 351 10*3/uL (ref 150–400)
RBC: 1.93 MIL/uL — ABNORMAL LOW (ref 4.22–5.81)
RDW: 14.6 % (ref 11.5–15.5)
WBC: 13.8 10*3/uL — ABNORMAL HIGH (ref 4.0–10.5)
nRBC: 0.1 % (ref 0.0–0.2)

## 2021-03-07 LAB — SEDIMENTATION RATE: Sed Rate: 117 mm/hr — ABNORMAL HIGH (ref 0–16)

## 2021-03-07 LAB — GLUCOSE, CAPILLARY
Glucose-Capillary: 124 mg/dL — ABNORMAL HIGH (ref 70–99)
Glucose-Capillary: 131 mg/dL — ABNORMAL HIGH (ref 70–99)
Glucose-Capillary: 136 mg/dL — ABNORMAL HIGH (ref 70–99)
Glucose-Capillary: 138 mg/dL — ABNORMAL HIGH (ref 70–99)
Glucose-Capillary: 146 mg/dL — ABNORMAL HIGH (ref 70–99)
Glucose-Capillary: 153 mg/dL — ABNORMAL HIGH (ref 70–99)

## 2021-03-07 LAB — COMPREHENSIVE METABOLIC PANEL
ALT: 13 U/L (ref 0–44)
AST: 13 U/L — ABNORMAL LOW (ref 15–41)
Albumin: 1.3 g/dL — ABNORMAL LOW (ref 3.5–5.0)
Alkaline Phosphatase: 78 U/L (ref 38–126)
Anion gap: 8 (ref 5–15)
BUN: 27 mg/dL — ABNORMAL HIGH (ref 8–23)
CO2: 22 mmol/L (ref 22–32)
Calcium: 8 mg/dL — ABNORMAL LOW (ref 8.9–10.3)
Chloride: 100 mmol/L (ref 98–111)
Creatinine, Ser: 1.05 mg/dL (ref 0.61–1.24)
GFR, Estimated: >60 mL/min (ref >60)
Glucose, Bld: 137 mg/dL — ABNORMAL HIGH (ref 70–99)
Potassium: 4.6 mmol/L (ref 3.5–5.1)
Sodium: 130 mmol/L — ABNORMAL LOW (ref 135–145)
Total Bilirubin: 0.6 mg/dL (ref 0.3–1.2)
Total Protein: 4.7 g/dL — ABNORMAL LOW (ref 6.5–8.1)

## 2021-03-07 LAB — GC/CHLAMYDIA PROBE AMP (~~LOC~~) NOT AT ARMC
Chlamydia: NEGATIVE
Comment: NEGATIVE
Comment: NORMAL
Neisseria Gonorrhea: NEGATIVE

## 2021-03-07 LAB — MAGNESIUM: Magnesium: 2.1 mg/dL (ref 1.7–2.4)

## 2021-03-07 LAB — PHOSPHORUS: Phosphorus: 4.3 mg/dL (ref 2.5–4.6)

## 2021-03-07 LAB — PROCALCITONIN: Procalcitonin: <0.1 ng/mL

## 2021-03-07 LAB — HEMOGLOBIN AND HEMATOCRIT, BLOOD
HCT: 23.7 % — ABNORMAL LOW (ref 39.0–52.0)
Hemoglobin: 8.1 g/dL — ABNORMAL LOW (ref 13.0–17.0)

## 2021-03-07 LAB — C-REACTIVE PROTEIN: CRP: 15.4 mg/dL — ABNORMAL HIGH (ref <1.0)

## 2021-03-07 LAB — TRIGLYCERIDES: Triglycerides: 83 mg/dL (ref <150)

## 2021-03-07 LAB — PREPARE RBC (CROSSMATCH)

## 2021-03-07 MED ORDER — TRAVASOL 10 % IV SOLN
INTRAVENOUS | Status: AC
  Filled 2021-03-07: qty 1020

## 2021-03-07 MED ORDER — METHYLPREDNISOLONE ACETATE 40 MG/ML IJ SUSP
40.0000 mg | Freq: Once | INTRAMUSCULAR | Status: DC
  Filled 2021-03-07: qty 1

## 2021-03-07 MED ORDER — SODIUM CHLORIDE 0.9% IV SOLUTION: Freq: Once | INTRAVENOUS | Status: AC

## 2021-03-07 MED ORDER — BUPIVACAINE HCL (PF) 0.5 % IJ SOLN
10.0000 mL | Freq: Once | INTRAMUSCULAR | Status: DC
  Filled 2021-03-07: qty 10

## 2021-03-07 NOTE — Progress Notes (Addendum)
Occupational Therapy Treatment Patient Details Name: Travis Conrad MRN: [id number] DOB: Aug 31, 1950 Today's Date: 03/07/2021   History of present illness 70 yo admitted 9/3 with abdominal pain, vomiting and ileus. 9/4 Afib with RVR. 9/8 intrabdominal abscess s/p IR drain placed. 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 Pt found to have lt iliopsoas hematoma on CT 9/16.  PMhx: HTN, HLD, Afib, ETOH use   OT comments  Patient up in recliner with complaints of abdomin and sacral pain.  Patient required mod assist to stand from recliner due to lower surface and min assist for transfer to eob. Performed stands from eob to straighten lines before go supine with mod assist.  Patient asked to end OT due to complaints of pain. Nursing in room at end of session.  Acute OT to continue to follow.    Recommendations for follow up therapy are one component of a multi-disciplinary discharge planning process, led by the attending physician.  Recommendations may be updated based on patient status, additional functional criteria and insurance authorization.    Follow Up Recommendations  Home health OT    Equipment Recommendations  3 in 1 bedside commode    Recommendations for Other Services      Precautions / Restrictions Precautions Precautions: Fall;Other (comment) Precaution Comments: wound vAC, jp drain, NGtube       Mobility Bed Mobility Overal bed mobility: Needs Assistance Bed Mobility: Sit to Supine Rolling: Min guard Sidelying to sit: Min assist   Sit to supine: Mod assist   General bed mobility comments: required assistance with BLEs and bed positioning    Transfers Overall transfer level: Needs assistance Equipment used: Rolling walker (2 wheeled) Transfers: Sit to/from Stand Sit to Stand: Mod assist Stand pivot transfers: Min assist       General transfer comment: mod assist to stand from recliner, once up was min assist  for transfer    Balance Overall balance assessment: Needs assistance Sitting-balance support: No upper extremity supported Sitting balance-Leahy Scale: Fair Sitting balance - Comments: able to sit without UE support   Standing balance support: Bilateral upper extremity supported Standing balance-Leahy Scale: Poor Standing balance comment: walker and min guard for static standing                           ADL either performed or assessed with clinical judgement   ADL                                               Vision       Perception     Praxis      Cognition Arousal/Alertness: Awake/alert Behavior During Therapy: WFL for tasks assessed/performed Overall Cognitive Status: Impaired/Different from baseline Area of Impairment: Memory                     Memory: Decreased short-term memory         General Comments: HOH        Exercises    Shoulder Instructions       General Comments      Pertinent Vitals/ Pain       Pain Assessment: Faces Pain Score: 7  Faces Pain Scale: Hurts even more Pain Location: abdomen Pain Descriptors / Indicators: Aching;Guarding Pain Intervention(s): Limited activity within  patient's tolerance;Repositioned  Home Living                                          Prior Functioning/Environment              Frequency  Min 2X/week        Progress Toward Goals  OT Goals(current goals can now be found in the care plan section)  Progress towards OT goals: Progressing toward goals  Acute Rehab OT Goals Patient Stated Goal: return home and be able to walk OT Goal Formulation: With patient Time For Goal Achievement: 03/13/21 Potential to Achieve Goals: Good  Plan Discharge plan remains appropriate    Co-evaluation                 AM-PAC OT "6 Clicks" Daily Activity     Outcome Measure   Help from another person eating meals?: Total Help from another  person taking care of personal grooming?: A Little Help from another person toileting, which includes using toliet, bedpan, or urinal?: A Lot Help from another person bathing (including washing, rinsing, drying)?: A Lot Help from another person to put on and taking off regular upper body clothing?: A Little Help from another person to put on and taking off regular lower body clothing?: Total 6 Click Score: 12    End of Session Equipment Utilized During Treatment: Rolling walker  OT Visit Diagnosis: Muscle weakness (generalized) (M62.81);Pain   Activity Tolerance Treatment limited secondary to medical complications (Comment)   Patient Left in bed;with call bell/phone within reach;with bed alarm set;with nursing/sitter in room   Nurse Communication Other (comment) (on transfer status)        Time: [id number] OT Time Calculation (min): 8 min  Charges: OT General Charges $OT Visit: 1 Visit OT Treatments $Therapeutic Activity: 8-22 mins  Lodema Hong, OTA   Trixie Dredge 03/07/2021, 9:36 AM

## 2021-03-07 NOTE — Consult Note (Signed)
[place] Nurse wound follow up Patient receiving care in [facility] [address]W[facility], PA-C and Dr. Rush Farmer present for dressing change.  Wound type: Surgical midline incision Wound bed: Clean, beefy red granulation tissue Drainage (amount, consistency, odor) Bloody drainage in canister Periwound: intact Dressing procedure/placement/frequency: One piece of black foam dressing removed. One piece of black foam replaced. Drape applied, immediate seal obtained at 125 mmHg. WOC will continue to follow M/W/F   [place] Nurse ostomy follow up Patient still not putting out any stool. MD aware.  Stoma type/location: LUQ colostomy Stomal assessment/size: 1 3/8" red budded just above the level of the skin. Sutures in place.  Peristomal assessment: intact Treatment options for stomal/peristomal skin: Barrier ring Output: scant thin clear/blood Ostomy pouching: 1pc. convex Education provided: Wife was able to go through the entire process of changing the pouch. She was able to open and close, cut to size. She will complete the entire process on Wednesday with my direction.  Enrolled patient in [place] Start Discharge program: Yes (previously)  Cathlean Marseilles. Tamala Julian, MSN, RN, [place], [name], [facility] Wound Treatment Associate Pager [phone]

## 2021-03-07 NOTE — Progress Notes (Signed)
9 Days Post-Op  Subjective: CC: Patient reports stable left sided abdominal pain along with soreness around his midline wound. Pain controlled on current regimen. He denies any nausea but has hiccups. No output from ostomy. He did not get oob yesterday. About to work with PT. Voiding.   Objective: Vital signs in last 24 hours: Temp:  [97.9 F (36.6 C)-98.9 F (37.2 C)] 97.9 F (36.6 C) (09/19 0740) Pulse Rate:  [74-83] 78 (09/19 0808) Resp:  [16-25] 19 (09/19 0740) BP: (105-134)/(40-66) 134/66 (09/19 0808) SpO2:  [96 %-100 %] 99 % (09/19 0808) Weight:  [94 kg] 94 kg (09/19 0342) Last BM Date: 02/22/21  Intake/Output from previous day: 09/18 0701 - 09/19 0700 In: 2262.7 [I.V.:1148.2; IV Piggyback:1114.5] Out: 3300 [Urine:2050; Emesis/NG output:1000; Drains:250] Intake/Output this shift: No intake/output data recorded.  PE: Gen:  Alert, NAD, pleasant Pulm:  Normal rate and effort. CTAB Abd: Soft, mild distension, mild diffuse TTP without rebound or guarding greatest on left abdomen near ostomy and drain.  Drain w/ SS output. Colostomy is pink, appears healthy, small amount of serous drainage. No air or stool in colostomy bag. NGT w/ bilious output. Vac to midline incision with good seal Skin: no rashes noted, warm and dry  Lab Results:  Recent Labs    03/06/21 0131 03/07/21 0412  WBC 19.2* 13.8*  HGB 7.7* 6.7*  HCT 22.1* 19.8*  PLT 368 351   BMET Recent Labs    03/06/21 0732 03/07/21 0412  NA 130* 130*  K 4.8 4.6  CL 102 100  CO2 21* 22  GLUCOSE 145* 137*  BUN 28* 27*  CREATININE 1.01 1.05  CALCIUM 8.0* 8.0*   PT/INR No results for input(s): LABPROT, INR in the last 72 hours. CMP     Component Value Date/Time   NA 130 (L) 03/07/2021 0412   NA 134 09/30/2019 0959   NA 142 03/03/2016 0921   K 4.6 03/07/2021 0412   K 4.3 03/03/2016 0921   CL 100 03/07/2021 0412   CO2 22 03/07/2021 0412   CO2 25 03/03/2016 0921   GLUCOSE 137 (H) 03/07/2021 0412    GLUCOSE 89 03/03/2016 0921   BUN 27 (H) 03/07/2021 0412   BUN 11 09/30/2019 0959   BUN 13.6 03/03/2016 0921   CREATININE 1.05 03/07/2021 0412   CREATININE 1.2 03/03/2016 0921   CALCIUM 8.0 (L) 03/07/2021 0412   CALCIUM 9.6 03/03/2016 0921   PROT 4.7 (L) 03/07/2021 0412   PROT 7.3 03/03/2016 0921   ALBUMIN 1.3 (L) 03/07/2021 0412   ALBUMIN 3.8 03/03/2016 0921   AST 13 (L) 03/07/2021 0412   AST 36 (H) 03/03/2016 0921   ALT 13 03/07/2021 0412   ALT 31 03/03/2016 0921   ALKPHOS 78 03/07/2021 0412   ALKPHOS 116 03/03/2016 0921   BILITOT 0.6 03/07/2021 0412   BILITOT 0.71 03/03/2016 0921   GFRNONAA >60 03/07/2021 0412   GFRNONAA 67 07/25/2013 1712   GFRAA >60 10/03/2019 0930   GFRAA 77 07/25/2013 1712   Lipase     Component Value Date/Time   LIPASE 22 02/19/2021 1653    Studies/Results: DG Abd 1 View  Result Date: 03/05/2021 CLINICAL DATA: Tube placement. EXAM: ABDOMEN - 1 VIEW COMPARISON:  Abdominal x-ray 03/05/2021. FINDINGS: Enteric tube is coiled in the proximal stomach with distal tip just beyond the gastroesophageal junction. Dilated small bowel in the upper abdomen persists compatible with small bowel obstruction versus ileus. Lung bases are grossly clear. IMPRESSION: 1. Enteric tube tip  coiled in the proximal stomach. Electronically Signed   By: Ronney Asters M.D.   On: 03/05/2021 15:55   DG Abd Portable 1V  Result Date: 03/05/2021 CLINICAL DATA:  Nasogastric tube placement EXAM: PORTABLE ABDOMEN - 1 VIEW COMPARISON:  02/26/2021 FINDINGS: Interval advancement of nasogastric tube which is now coiled in the proximal stomach. Limited visualization upper small bowel shows persistent gaseous distension consistent with ileus or partial small bowel obstruction. IMPRESSION: Nasogastric tube is coiled in the proximal stomach. Electronically Signed   By: Aletta Edouard M.D.   On: 03/05/2021 11:45    Anti-infectives: Anti-infectives (From admission, onward)    Start     Dose/Rate  Route Frequency Ordered Stop   02/26/21 0100  doxycycline (VIBRAMYCIN) 100 mg in sodium chloride 0.9 % 250 mL IVPB        100 mg 125 mL/hr over 120 Minutes Intravenous 2 times daily 02/26/21 0005 03/08/21 2359   02/20/21 0300  piperacillin-tazobactam (ZOSYN) IVPB 3.375 g        3.375 g 12.5 mL/hr over 240 Minutes Intravenous Every 8 hours 02/19/21 1907 03/08/21 2359   02/19/21 1900  ceFEPIme (MAXIPIME) 2 g in sodium chloride 0.9 % 100 mL IVPB  Status:  Discontinued        2 g 200 mL/hr over 30 Minutes Intravenous  Once 02/19/21 1857 02/19/21 1906   02/19/21 1900  metroNIDAZOLE (FLAGYL) IVPB 500 mg  Status:  Discontinued        500 mg 100 mL/hr over 60 Minutes Intravenous  Once 02/19/21 1857 02/19/21 1906   02/19/21 1815  piperacillin-tazobactam (ZOSYN) IVPB 3.375 g        3.375 g 100 mL/hr over 30 Minutes Intravenous  Once 02/19/21 1809 02/19/21 1944        Assessment/Plan POD 9 s/p ex lap, sigmoid colectomy, end colostomy with enterorrhaphy and drainage of intraabdominal abscess - Dr. Kieth Brightly and Dr. Radene Knee 9/10 for Acute sigmoid diverticulitis with contained perforation and abscess  - CT 9/16 demonstrates heterogenous left iliopsoas collection likely hematomaa, stranding in the retroperitoneum and subcu tissue.  Distal colon appears decompressed and very small caliber but there is contrast entering this area; there is additionally a similarly small-caliber segment of colon in the proximal transverse and the ascending colon is not dilated- suspect over all this represents small bowel/gastric ileus.  IR does not recommend draining the hematoma at this point.   - Still with ileus. Continue NG. Was started on reglan over the weekend - Cont drain  - Wound vac MWF. (Will see with WOCN at 47 today) - WOCN following for new ostomy and vac changes - Await bowel function and keep NGT to LIWS today - Cont TPN - Encouraged ambulation (working with therapies), use IS, multimodal pain control  -  PT rec HH - Cont abx per ID recs   FEN - NGT LIWS, NPO sips/chips, IVF, TPN VTE - heparin gtt on hold for abl anemia  ID - Zosyn 9/4 - 9/20 per ID. Doxycycline through 9/20 for orchitis per ID. WBC down at 13.8 from 19.2. Afebrile. Foley - out, voiding.    Per primary: A fib - cards following  AKI - resolved ABL anemia -  7.7 > 6.7. 1U PRBC today. Heparin gtt held HTN ETOH use   LOS: 15 days    Jillyn Ledger , [facility] 03/07/2021, 8:13 AM Please see Amion for pager number during day hours 7:00am-4:30pm

## 2021-03-07 NOTE — Progress Notes (Signed)
  Midline wound clean with beefy red granulation tissue at the sides and base. Periwound clean. Continue wound vac.   Ostomy pink, above skin level and viable. Digitalization of the stoma performed and without evidence of stricture.   Alferd Apa, [facility]  03/07/2021, 10:36 AM

## 2021-03-07 NOTE — Progress Notes (Signed)
ANTICOAGULATION CONSULT NOTE - Follow Up Consult  Pharmacy Consult for Heparin Indication: atrial fibrillation  Allergies  Allergen Reactions   Atorvastatin Other (See Comments)    myalgias    Patient Measurements: Height: 6' (182.9 cm) Weight: 94 kg (207 lb 3.7 oz) IBW/kg (Calculated) : 77.6 Heparin Dosing Weight: 79.4 kg  Vital Signs: Temp: 98 F (36.7 C) (09/19 1010) Temp Source: Oral (09/19 1010) BP: 121/53 (09/19 1010) Pulse Rate: 72 (09/19 1010)  Labs: Recent Labs    03/05/21 0129 03/05/21 1011 03/05/21 1625 03/06/21 0131 03/06/21 0732 03/07/21 0412  HGB 8.7*  --   --  7.7*  --  6.7*  HCT 25.3*  --   --  22.1*  --  19.8*  PLT 397  --   --  368  --  351  HEPARINUNFRC 0.14* 0.27* 0.34 0.29*  --   --   CREATININE 0.89  --   --   --  1.01 1.05     Estimated Creatinine Clearance: 78 mL/min (by C-G formula based on SCr of 1.05 mg/dL).   Assessment: 70 y/o male admitted for sepsis from contained perforated diverticulitis with active ileus. On PTA Xarelto for Afib with last dose on 9/2 at 2000.  Pharmacy consulted for IV heparin.  9/16 CT with  possible psoas hematoma -hg= 6.7 (trend down)  Goal of Therapy:  Heparin level Goal: 0.3-0.5 units/mL Monitor platelets by anticoagulation protocol: Yes   Plan:  -hold heparin  -Will follow daily CBC and patient progress  Hildred Laser, PharmD Clinical Pharmacist **Pharmacist phone directory can now be found on [email or web address] ([email or web address]).  Listed under [place].

## 2021-03-07 NOTE — Consult Note (Addendum)
Reason for Consult:Left knee pain Referring Physician: Lala Lund Time called: 1518 Time at bedside: Travis Conrad is an 70 y.o. male.  HPI: Curran has been hospitalized for 2 weeks following perforated diverticulitis. Over the weekend he began to develop some back pain, mostly on right, but then later on developed fairly significant left knee pain. He is still able to bear weight but it's very painful. He denies any prior hx/o knee problems or gout.  Past Medical History:  Diagnosis Date   Atrial fibrillation ([place])    Dysrhythmia    a-fib   High cholesterol    "RX made groin hurt" (08/14/2016)   Hypertension     Past Surgical History:  Procedure Laterality Date   ANKLE FRACTURE SURGERY Left ~ 2012   "put rod and pins in"   ATRIAL FIBRILLATION ABLATION N/A 10/03/2019   Procedure: ATRIAL FIBRILLATION ABLATION;  Surgeon: Constance Haw, MD;  Location: [facility];  Service: Cardiovascular;  Laterality: N/A;   COLECTOMY N/A 02/26/2021   Procedure: PARTIAL COLECTOMY AND COLOSTOMY;  Surgeon: Kieth Brightly, Arta Bruce, MD;  Location: [facility];  Service: General;  Laterality: N/A;   COLONOSCOPY W/ BIOPSIES AND POLYPECTOMY  ~ 2000   FRACTURE SURGERY     LAPAROTOMY  02/26/2021   Procedure: EXPLORATORY LAPAROTOMY;  Surgeon: Mickeal Skinner, MD;  Location: [facility];  Service: General;;   ORIF FIBULA FRACTURE  06/24/2012   Procedure: OPEN REDUCTION INTERNAL FIXATION (ORIF) FIBULA FRACTURE;  Surgeon: [name], MD;  Location: [facility];  Service: Orthopedics;  Laterality: Left;  ORIF LEFT FIBULA,  POSSIBLE REPAIR OF SYNDESMOSIS    TEE WITHOUT CARDIOVERSION N/A 10/03/2019   Procedure: TRANSESOPHAGEAL ECHOCARDIOGRAM (TEE);  Surgeon: Constance Haw, MD;  Location: [facility];  Service: Cardiovascular;  Laterality: N/A;    Family History  Problem Relation Age of Onset   Other Brother     Social History:  reports that he has been smoking cigarettes. He has a  50.00 pack-year smoking history. He has never used smokeless tobacco. He reports that he does not currently use alcohol. He reports current drug use. Drugs: Cocaine and Marijuana.  Allergies:  Allergies  Allergen Reactions   Atorvastatin Other (See Comments)    myalgias    Medications: I have reviewed the patient's current medications.  Results for orders placed or performed during the hospital encounter of 02/19/21 (from the past 48 hour(s))  Glucose, capillary     Status: Abnormal   Collection Time: 03/05/21  4:01 PM  Result Value Ref Range   Glucose-Capillary 146 (H) 70 - 99 mg/dL    Comment: Glucose reference range applies only to samples taken after fasting for at least 8 hours.  Heparin level (unfractionated)     Status: None   Collection Time: 03/05/21  4:25 PM  Result Value Ref Range   Heparin Unfractionated 0.34 0.30 - 0.70 IU/mL    Comment: (NOTE) The clinical reportable range upper limit is being lowered to >1.10 to align with the FDA approved guidance for the current laboratory assay.  If heparin results are below expected values, and patient dosage has  been confirmed, suggest follow up testing of antithrombin III levels. Performed at [facility], [place] [address] [place], [place] [address]   Glucose, capillary     Status: Abnormal   Collection Time: 03/05/21  7:30 PM  Result Value Ref Range   Glucose-Capillary 136 (H) 70 - 99 mg/dL  Comment: Glucose reference range applies only to samples taken after fasting for at least 8 hours.  Glucose, capillary     Status: Abnormal   Collection Time: 03/05/21 11:42 PM  Result Value Ref Range   Glucose-Capillary 133 (H) 70 - 99 mg/dL    Comment: Glucose reference range applies only to samples taken after fasting for at least 8 hours.  CBC     Status: Abnormal   Collection Time: 03/06/21  1:31 AM  Result Value Ref Range   WBC 19.2 (H) 4.0 - 10.5 K/uL   RBC 2.19 (L) 4.22 - 5.81 MIL/uL   Hemoglobin 7.7 (L) 13.0 -  17.0 g/dL   HCT 22.1 (L) 39.0 - 52.0 %   MCV 100.9 (H) 80.0 - 100.0 fL   MCH 35.2 (H) 26.0 - 34.0 pg   MCHC 34.8 30.0 - 36.0 g/dL   RDW 14.4 11.5 - 15.5 %   Platelets 368 150 - 400 K/uL   nRBC 0.0 0.0 - 0.2 %    Comment: Performed at [facility], [place] [address], [place], [place] [address]  Procalcitonin     Status: None   Collection Time: 03/06/21  1:31 AM  Result Value Ref Range   Procalcitonin <0.10 ng/mL    Comment:        Interpretation: PCT (Procalcitonin) <= 0.5 ng/mL: Systemic infection (sepsis) is not likely. Local bacterial infection is possible. (NOTE)       Sepsis PCT Algorithm           Lower Respiratory Tract                                      Infection PCT Algorithm    ----------------------------     ----------------------------         PCT < 0.25 ng/mL                PCT < 0.10 ng/mL          Strongly encourage             Strongly discourage   discontinuation of antibiotics    initiation of antibiotics    ----------------------------     -----------------------------       PCT 0.25 - 0.50 ng/mL            PCT 0.10 - 0.25 ng/mL               OR       >80% decrease in PCT            Discourage initiation of                                            antibiotics      Encourage discontinuation           of antibiotics    ----------------------------     -----------------------------         PCT >= 0.50 ng/mL              PCT 0.26 - 0.50 ng/mL               AND        <80% decrease in PCT             Encourage initiation  of                                             antibiotics       Encourage continuation           of antibiotics    ----------------------------     -----------------------------        PCT >= 0.50 ng/mL                  PCT > 0.50 ng/mL               AND         increase in PCT                  Strongly encourage                                      initiation of antibiotics    Strongly encourage escalation           of  antibiotics                                     -----------------------------                                           PCT <= 0.25 ng/mL                                                 OR                                        > 80% decrease in PCT                                      Discontinue / Do not initiate                                             antibiotics  Performed at [facility], [address], [place], [place] [address]   Heparin level (unfractionated)     Status: Abnormal   Collection Time: 03/06/21  1:31 AM  Result Value Ref Range   Heparin Unfractionated 0.29 (L) 0.30 - 0.70 IU/mL    Comment: (NOTE) The clinical reportable range upper limit is being lowered to >1.10 to align with the FDA approved guidance for the current laboratory assay.  If heparin results are below expected values, and patient dosage has  been confirmed, suggest follow up testing of antithrombin III levels. Performed at [facility], [place] [address] [place], [place] [address]   Glucose, capillary     Status: Abnormal   Collection Time:  03/06/21  3:43 AM  Result Value Ref Range   Glucose-Capillary 148 (H) 70 - 99 mg/dL    Comment: Glucose reference range applies only to samples taken after fasting for at least 8 hours.  Basic metabolic panel     Status: Abnormal   Collection Time: 03/06/21  7:32 AM  Result Value Ref Range   Sodium 130 (L) 135 - 145 mmol/L   Potassium 4.8 3.5 - 5.1 mmol/L   Chloride 102 98 - 111 mmol/L   CO2 21 (L) 22 - 32 mmol/L   Glucose, Bld 145 (H) 70 - 99 mg/dL    Comment: Glucose reference range applies only to samples taken after fasting for at least 8 hours.   BUN 28 (H) 8 - 23 mg/dL   Creatinine, Ser 0.76 0.61 - 1.24 mg/dL   Calcium 8.0 (L) 8.9 - 10.3 mg/dL   GFR, Estimated >62 >21 mL/min    Comment: (NOTE) Calculated using the CKD-EPI Creatinine Equation (2021)    Anion gap 7 5 - 15    Comment: Performed at [facility], [address], [place], [place] [address]  Glucose, capillary     Status: Abnormal   Collection Time: 03/06/21  7:43 AM  Result Value Ref Range   Glucose-Capillary 150 (H) 70 - 99 mg/dL    Comment: Glucose reference range applies only to samples taken after fasting for at least 8 hours.  Glucose, capillary     Status: Abnormal   Collection Time: 03/06/21 11:49 AM  Result Value Ref Range   Glucose-Capillary 140 (H) 70 - 99 mg/dL    Comment: Glucose reference range applies only to samples taken after fasting for at least 8 hours.  Glucose, capillary     Status: Abnormal   Collection Time: 03/06/21  4:04 PM  Result Value Ref Range   Glucose-Capillary 150 (H) 70 - 99 mg/dL    Comment: Glucose reference range applies only to samples taken after fasting for at least 8 hours.  Glucose, capillary     Status: Abnormal   Collection Time: 03/06/21  7:39 PM  Result Value Ref Range   Glucose-Capillary 138 (H) 70 - 99 mg/dL    Comment: Glucose reference range applies only to samples taken after fasting for at least 8 hours.  Glucose, capillary     Status: Abnormal   Collection Time: 03/06/21 11:43 PM  Result Value Ref Range   Glucose-Capillary 135 (H) 70 - 99 mg/dL    Comment: Glucose reference range applies only to samples taken after fasting for at least 8 hours.  Glucose, capillary     Status: Abnormal   Collection Time: 03/07/21  3:42 AM  Result Value Ref Range   Glucose-Capillary 136 (H) 70 - 99 mg/dL    Comment: Glucose reference range applies only to samples taken after fasting for at least 8 hours.  Comprehensive metabolic panel     Status: Abnormal   Collection Time: 03/07/21  4:12 AM  Result Value Ref Range   Sodium 130 (L) 135 - 145 mmol/L   Potassium 4.6 3.5 - 5.1 mmol/L   Chloride 100 98 - 111 mmol/L   CO2 22 22 - 32 mmol/L   Glucose, Bld 137 (H) 70 - 99 mg/dL    Comment: Glucose reference range applies only to samples taken after fasting for at least 8 hours.   BUN 27 (H) 8 - 23 mg/dL    Creatinine, Ser 0.13 0.61 - 1.24 mg/dL  Calcium 8.0 (L) 8.9 - 10.3 mg/dL   Total Protein 4.7 (L) 6.5 - 8.1 g/dL   Albumin 1.3 (L) 3.5 - 5.0 g/dL   AST 13 (L) 15 - 41 U/L   ALT 13 0 - 44 U/L   Alkaline Phosphatase 78 38 - 126 U/L   Total Bilirubin 0.6 0.3 - 1.2 mg/dL   GFR, Estimated >60 >60 mL/min    Comment: (NOTE) Calculated using the CKD-EPI Creatinine Equation (2021)    Anion gap 8 5 - 15    Comment: Performed at [facility] [address], [place], [place] [address]  Magnesium     Status: None   Collection Time: 03/07/21  4:12 AM  Result Value Ref Range   Magnesium 2.1 1.7 - 2.4 mg/dL    Comment: Performed at [facility] [address], [place], [place] [address]  Phosphorus     Status: None   Collection Time: 03/07/21  4:12 AM  Result Value Ref Range   Phosphorus 4.3 2.5 - 4.6 mg/dL    Comment: Performed at [facility] [address], [place], [place] [address]  Triglycerides     Status: None   Collection Time: 03/07/21  4:12 AM  Result Value Ref Range   Triglycerides 83 <150 mg/dL    Comment: Performed at [facility] [address], [place] [address]  CBC     Status: Abnormal   Collection Time: 03/07/21  4:12 AM  Result Value Ref Range   WBC 13.8 (H) 4.0 - 10.5 K/uL   RBC 1.93 (L) 4.22 - 5.81 MIL/uL   Hemoglobin 6.7 (LL) 13.0 - 17.0 g/dL    Comment: REPEATED TO VERIFY THIS CRITICAL RESULT HAS VERIFIED AND BEEN CALLED TO L FINDLEY RN BY CANDACE HAYES ON 09 19 2022 AT 0535, AND HAS BEEN READ BACK.     HCT 19.8 (L) 39.0 - 52.0 %   MCV 102.6 (H) 80.0 - 100.0 fL   MCH 34.7 (H) 26.0 - 34.0 pg   MCHC 33.8 30.0 - 36.0 g/dL   RDW 14.6 11.5 - 15.5 %   Platelets 351 150 - 400 K/uL   nRBC 0.1 0.0 - 0.2 %    Comment: Performed at [facility] [address], [place], [place] [address]  Procalcitonin     Status: None   Collection Time: 03/07/21  4:12 AM  Result Value Ref Range   Procalcitonin <0.10 ng/mL    Comment:         Interpretation: PCT (Procalcitonin) <= 0.5 ng/mL: Systemic infection (sepsis) is not likely. Local bacterial infection is possible. (NOTE)       Sepsis PCT Algorithm           Lower Respiratory Tract                                      Infection PCT Algorithm    ----------------------------     ----------------------------         PCT < 0.25 ng/mL                PCT < 0.10 ng/mL          Strongly encourage             Strongly discourage   discontinuation of antibiotics    initiation of antibiotics    ----------------------------     -----------------------------  PCT 0.25 - 0.50 ng/mL            PCT 0.10 - 0.25 ng/mL               OR       >80% decrease in PCT            Discourage initiation of                                            antibiotics      Encourage discontinuation           of antibiotics    ----------------------------     -----------------------------         PCT >= 0.50 ng/mL              PCT 0.26 - 0.50 ng/mL               AND        <80% decrease in PCT             Encourage initiation of                                             antibiotics       Encourage continuation           of antibiotics    ----------------------------     -----------------------------        PCT >= 0.50 ng/mL                  PCT > 0.50 ng/mL               AND         increase in PCT                  Strongly encourage                                      initiation of antibiotics    Strongly encourage escalation           of antibiotics                                     -----------------------------                                           PCT <= 0.25 ng/mL                                                 OR                                        > 80% decrease in PCT  Discontinue / Do not initiate                                             antibiotics  Performed at [facility], [place] [address], [place], [place] [address]    Prepare RBC (crossmatch)     Status: None   Collection Time: 03/07/21  6:50 AM  Result Value Ref Range   Order Confirmation      ORDER PROCESSED BY BLOOD BANK Performed at [facility], [place] [address], [place], [place] [address]   Type and screen [place]     Status: None (Preliminary result)   Collection Time: 03/07/21  6:50 AM  Result Value Ref Range   ABO/RH(D) A POS    Antibody Screen NEG    Sample Expiration 03/10/2021,2359    Unit Number [id number]    Blood Component Type RED CELLS,LR    Unit division 00    Status of Unit ISSUED    Transfusion Status OK TO TRANSFUSE    Crossmatch Result      Compatible Performed at [facility], [place] [address] [place], [place] [address]   Glucose, capillary     Status: Abnormal   Collection Time: 03/07/21  7:38 AM  Result Value Ref Range   Glucose-Capillary 146 (H) 70 - 99 mg/dL    Comment: Glucose reference range applies only to samples taken after fasting for at least 8 hours.  Glucose, capillary     Status: Abnormal   Collection Time: 03/07/21 11:33 AM  Result Value Ref Range   Glucose-Capillary 131 (H) 70 - 99 mg/dL    Comment: Glucose reference range applies only to samples taken after fasting for at least 8 hours.    No results found.  Review of Systems  HENT:  Negative for ear discharge, ear pain, hearing loss and tinnitus.   Eyes:  Negative for photophobia and pain.  Respiratory:  Negative for cough and shortness of breath.   Cardiovascular:  Negative for chest pain.  Gastrointestinal:  Positive for abdominal pain. Negative for nausea and vomiting.  Genitourinary:  Negative for dysuria, flank pain, frequency and urgency.  Musculoskeletal:  Positive for arthralgias (Left knee) and back pain. Negative for myalgias and neck pain.  Neurological:  Positive for numbness (Left hip). Negative for dizziness and headaches.  Hematological:  Does not bruise/bleed easily.  Psychiatric/Behavioral:   The patient is not nervous/anxious.   Blood pressure (!) 119/51, pulse 73, temperature 98.8 F (37.1 C), temperature source Oral, resp. rate 16, height 6' (1.829 m), weight 94 kg, SpO2 98 %. Physical Exam Constitutional:      General: He is not in acute distress.    Appearance: He is well-developed. He is not diaphoretic.  HENT:     Head: Normocephalic and atraumatic.  Eyes:     General: No scleral icterus.       Right eye: No discharge.        Left eye: No discharge.     Conjunctiva/sclera: Conjunctivae normal.  Cardiovascular:     Rate and Rhythm: Normal rate and regular rhythm.  Pulmonary:     Effort: Pulmonary effort is normal. No respiratory distress.  Musculoskeletal:     Cervical back: Normal range of motion.     Comments: [name] No traumatic wounds, ecchymosis, or rash  Severe TTP to  light tough, unable to SLR, AROM 170-100 degrees, PROM 180-90  Mod knee effusion  Sens DPN, SPN, TN intact  Motor EHL, ext, flex, evers 5/5  DP 2+, PT 1, No significant edema  Skin:    General: Skin is warm and dry.  Neurological:     Mental Status: He is alert.  Psychiatric:        Mood and Affect: Mood normal.        Behavior: Behavior normal.    Assessment/Plan: Left knee pain -- Doubt septic joint given good range of motion. Will check x-rays, ESR, and CRP. Plan arthrocentesis in AM. Would consider lumbar CT or MRI given new back pain and left hip paresthesias.    Lisette Abu, PA-C Orthopedic Surgery [phone] 03/07/2021, 3:43 PM

## 2021-03-07 NOTE — Progress Notes (Addendum)
Progress Note  Patient Name: Travis Conrad Date of Encounter: 03/07/2021  [facility] Cardiologist: [name], MD   Subjective   Some abdominal pain. No palpitations  Inpatient Medications    Scheduled Meds:  amiodarone  200 mg Per NG tube BID   amLODipine  10 mg Per Tube Daily   Chlorhexidine Gluconate Cloth  6 each Topical Daily   insulin aspart  0-15 Units Subcutaneous Q4H   lidocaine  1 patch Transdermal Q24H   melatonin  3 mg Per Tube QHS   metoCLOPramide (REGLAN) injection  10 mg Intravenous Q6H   metoprolol tartrate  100 mg Per Tube BID   nicotine  21 mg Transdermal Daily   pantoprazole (PROTONIX) IV  40 mg Intravenous Q24H   sodium chloride flush  10 mL Intracatheter Q12H   Continuous Infusions:  sodium chloride Stopped (03/05/21 0320)   doxycycline (VIBRAMYCIN) IV 100 mg (03/07/21 0934)   methocarbamol (ROBAXIN) IV 200 mL/hr at 03/07/21 0621   piperacillin-tazobactam (ZOSYN)  IV 3.375 g (03/07/21 1211)   TPN ADULT (ION) 85 mL/hr at 03/07/21 0621   TPN ADULT (ION)     PRN Meds: sodium chloride, butamben-tetracaine-benzocaine, hydrALAZINE, menthol-cetylpyridinium, metoprolol tartrate, morphine injection, [DISCONTINUED] ondansetron **OR** ondansetron (ZOFRAN) IV, oxyCODONE, phenol, zolpidem   Vital Signs    Vitals:   03/07/21 0808 03/07/21 0915 03/07/21 1010 03/07/21 1133  BP: 134/66 (!) 141/61 (!) 121/53 (!) 116/45  Pulse: 78 80 72 73  Resp:  '20 16 20  '$ Temp:  98.1 F (36.7 C) 98 F (36.7 C) 97.8 F (36.6 C)  TempSrc:  Oral Oral Oral  SpO2: 99% 98% 97% 97%  Weight:      Height:        Intake/Output Summary (Last 24 hours) at 03/07/2021 1317 Last data filed at 03/07/2021 1225 Gross per 24 hour  Intake 2252.71 ml  Output 3180 ml  Net -927.29 ml   Last 3 Weights 03/07/2021 03/06/2021 02/26/2021  Weight (lbs) 207 lb 3.7 oz 188 lb 11.4 oz 182 lb  Weight (kg) 94 kg 85.6 kg 82.555 kg      Telemetry    NSR - Personally Reviewed  ECG       Physical Exam   GEN: No acute distress.   Neck: No JVD Cardiac: RRR, no murmurs, rubs, or gallops.  Respiratory: Clear to auscultation bilaterally. GI: Soft, non-distended ; colostomy bag, mildly tender, no guarding MS: No edema; No deformity. Neuro:  Nonfocal  Psych: Normal affect   Labs    High Sensitivity Troponin:  No results for input(s): TROPONINIHS in the last 720 hours.   Chemistry Recent Labs  Lab 03/03/21 0546 03/04/21 1120 03/05/21 0129 03/06/21 0732 03/07/21 0412  NA 132* 131* 128* 130* 130*  K 4.7 4.9 4.7 4.8 4.6  CL 101 101 97* 102 100  CO2 24 23 21* 21* 22  GLUCOSE 178* 111* 166* 145* 137*  BUN '16 17 17 '$ 28* 27*  CREATININE 0.83 0.86 0.89 1.01 1.05  CALCIUM 8.1* 7.9* 8.4* 8.0* 8.0*  MG 2.1  --  2.1  --  2.1  PROT 4.7* 4.8* 5.1*  --  4.7*  ALBUMIN 1.6* 1.6* 1.6*  --  1.3*  AST 15 12* 12*  --  13*  ALT '11 9 11  '$ --  13  ALKPHOS 53 60 68  --  78  BILITOT 0.3 0.5 0.5  --  0.6  GFRNONAA >60 >60 >60 >60 >60  ANIONGAP '7 7 10 7 '$ 8  Lipids  Recent Labs  Lab 03/07/21 0412  TRIG 83    Hematology Recent Labs  Lab 03/05/21 0129 03/06/21 0131 03/07/21 0412  WBC 21.6* 19.2* 13.8*  RBC 2.52* 2.19* 1.93*  HGB 8.7* 7.7* 6.7*  HCT 25.3* 22.1* 19.8*  MCV 100.4* 100.9* 102.6*  MCH 34.5* 35.2* 34.7*  MCHC 34.4 34.8 33.8  RDW 14.3 14.4 14.6  PLT 397 368 351   Thyroid No results for input(s): TSH, FREET4 in the last 168 hours.  BNPNo results for input(s): BNP, PROBNP in the last 168 hours.  DDimer No results for input(s): DDIMER in the last 168 hours.   Radiology    DG Abd 1 View  Result Date: 03/05/2021 CLINICAL DATA: Tube placement. EXAM: ABDOMEN - 1 VIEW COMPARISON:  Abdominal x-ray 03/05/2021. FINDINGS: Enteric tube is coiled in the proximal stomach with distal tip just beyond the gastroesophageal junction. Dilated small bowel in the upper abdomen persists compatible with small bowel obstruction versus ileus. Lung bases are grossly clear.  IMPRESSION: 1. Enteric tube tip coiled in the proximal stomach. Electronically Signed   By: Ronney Asters M.D.   On: 03/05/2021 15:55    Cardiac Studies   Normal LVEF, 60-65%  Patient Profile     70 y.o. male with PAF in the setting of GI surgery  Assessment & Plan       Patient is maintaining sinus rhythm.   IV heparin stopped by primary due to drop in Hgb with plan to transfuse. Resume anticoagulation when okay with primary team and Hgb is stable.   Continue PO amiodarone.  Can switch to IV if he has to be NPO.    For questions or updates, please contact [place] Please consult [email or web address] for contact info under        Signed, Larae Grooms, MD  03/07/2021, 1:17 PM

## 2021-03-07 NOTE — Progress Notes (Signed)
Physical Therapy Treatment Patient Details Name: Travis Conrad MRN: [id number] DOB: February 04, 1951 Today's Date: 03/07/2021   History of Present Illness 70 yo admitted 9/3 with abdominal pain, vomiting and ileus. 9/4 Afib with RVR. 9/8 intrabdominal abscess s/p IR drain placed. 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 Pt found to have lt iliopsoas hematoma on CT 9/16.  PMhx: HTN, HLD, Afib, ETOH use    PT Comments    Pt pleasant and willing to participate in therapy. Pt reports abdomen, left flank, left knee pain with decreased ability to tolerate standing.  Pt with BP stable with supine 134/66 and sitting 151/86 without significant dizziness given low Hgb. Pt continues to struggle with recall of sequence for transfers and mobility. Pt with continued slow progression in gait and with multiple medical complications feel pt would do well with intensive rehab prior to return home. Pt agreeable to sitting up <1hr with staff aware due to sacral and scrotal pain. Will continue to follow to maximize function and independence.    Recommendations for follow up therapy are one component of a multi-disciplinary discharge planning process, led by the attending physician.  Recommendations may be updated based on patient status, additional functional criteria and insurance authorization.  Follow Up Recommendations  CIR;Supervision for mobility/OOB     Equipment Recommendations  None recommended by PT    Recommendations for Other Services       Precautions / Restrictions Precautions Precautions: Fall;Other (comment) Precaution Comments: wound vAC, jp drain, NGtube     Mobility  Bed Mobility Overal bed mobility: Needs Assistance Bed Mobility: Rolling;Sidelying to Sit Rolling: Min guard Sidelying to sit: Min assist       General bed mobility comments: guarding for rolling to side and assist to rise from side with cues for splinting with  pillow    Transfers Overall transfer level: Needs assistance Equipment used: Rolling walker (2 wheeled) Transfers: Sit to/from Stand Sit to Stand: From elevated surface;Min assist         General transfer comment: cues for hand placement and safety, increased time to stabilize in standing  Ambulation/Gait Ambulation/Gait assistance: Min guard Gait Distance (Feet): 150 Feet Assistive device: Rolling walker (2 wheeled) Gait Pattern/deviations: Trunk flexed;Decreased stride length;Step-through pattern   Gait velocity interpretation: 1.31 - 2.62 ft/sec, indicative of limited community ambulator General Gait Details: Assist for safety and lines. Verbal cues to stay closer to walker and extend trunk   Stairs             Wheelchair Mobility    Modified Rankin (Stroke Patients Only)       Balance Overall balance assessment: Needs assistance Sitting-balance support: No upper extremity supported Sitting balance-Leahy Scale: Fair Sitting balance - Comments: able to sit without UE support   Standing balance support: Bilateral upper extremity supported Standing balance-Leahy Scale: Poor Standing balance comment: walker and min guard for static standing                            Cognition Arousal/Alertness: Awake/alert Behavior During Therapy: WFL for tasks assessed/performed Overall Cognitive Status: Impaired/Different from baseline Area of Impairment: Memory                     Memory: Decreased short-term memory         General Comments: HOH      Exercises General Exercises - Lower Extremity Long Arc Quad: AROM;Both;Seated;20  reps Hip Flexion/Marching: AROM;Seated;Right;Other reps (comment) (pt with increased pain left ilopsoas and unable . 30 reps)    General Comments        Pertinent Vitals/Pain Pain Score: 7  Pain Location: abdomen Pain Descriptors / Indicators: Aching;Guarding Pain Intervention(s): Limited activity within  patient's tolerance;Monitored during session;Premedicated before session;Repositioned    Home Living                      Prior Function            PT Goals (current goals can now be found in the care plan section) Progress towards PT goals: Progressing toward goals    Frequency    Min 3X/week      PT Plan Current plan remains appropriate    Co-evaluation              AM-PAC PT "6 Clicks" Mobility   Outcome Measure  Help needed turning from your back to your side while in a flat bed without using bedrails?: A Little Help needed moving from lying on your back to sitting on the side of a flat bed without using bedrails?: A Little Help needed moving to and from a bed to a chair (including a wheelchair)?: A Little Help needed standing up from a chair using your arms (e.g., wheelchair or bedside chair)?: A Little Help needed to walk in hospital room?: A Little Help needed climbing 3-5 steps with a railing? : A Lot 6 Click Score: 17    End of Session   Activity Tolerance: Patient tolerated treatment well Patient left: in chair;with call bell/phone within reach;with chair alarm set   PT Visit Diagnosis: Other abnormalities of gait and mobility (R26.89);Difficulty in walking, not elsewhere classified (R26.2)     Time: [id number] PT Time Calculation (min) (ACUTE ONLY): 33 min  Charges:  $Gait Training: 8-22 mins $Therapeutic Activity: 8-22 mins                     Joell Usman P, PT Acute Rehabilitation Services Pager: [phone] Office: [place] 03/07/2021, 8:48 AM

## 2021-03-07 NOTE — Progress Notes (Addendum)
PHARMACY - TOTAL PARENTERAL NUTRITION CONSULT NOTE   Indication: Prolonged ileus  Patient Measurements: Height: 6' (182.9 cm) Weight: 94 kg (207 lb 3.7 oz) IBW/kg (Calculated) : 77.6 TPN AdjBW (KG): 82.6 Body mass index is 28.11 kg/m.  Assessment: 82 YOM with lower abdominal pain/N/V found to have perforated diverticulitis. CT abd on 9/3 found to have active ileus/pSBO from diverticulitis/bowel inflammation. Pharmacy consulted to start TPN for prolonged ileus. Of note patient has a h/o alcohol dependency and is at high risk for refeeding.   Glucose / Insulin: no hx DM, CBGs 130-140s. SSI/24h: 12 units Electrolytes: Na 130, K 4.6 (goal >/=4 with ileus), Mg 2.1 (goal >/=2 with ileus), others wnl Renal: SCr 1 stable, BUN 27 Hepatic: LFTs / Tbili / TG wnl, Albumin 1.3 down Intake / Output; MIVF: NG 1080mml, Drain output 250 mL up; none from colostomy, UOP 20526m(0.9) , LBM 9/6 - IR consulted to see if psoas fluid is drainable GI Meds: IV Reglan/6hr, PPI IV/24h  GI Imaging:  - 9/3 CT abd: Active ileus/PSBO - 9/16 CT abd: L-iliopsoas fluid collection (abscess or hematoma), resolution of prior diverticular abscess, still w/ evidence of ileus GI Surgeries / Procedures:  - 9/10 ex-lap, sigmoid colectomy w/ end colostomy creation, enterorrhaphy, drainage of intra-abd abscess, wound vac placement  Central access: PICC line 9/8 >>  TPN start date: 9/9>>   Nutritional Goals: Goal TPN rate is 85 mL/hr (provides 102 g of protein and 2104 kcals per day)  RD Assessment: Estimated Needs Total Energy Estimated Needs: 2000-2200 Total Protein Estimated Needs: 100-110 grams Total Fluid Estimated Needs: >2L  Current Nutrition:  TPN; NPO  Plan:  Continue TPN at goal rate 8576mr at 1800 (102g AA, 44.8g lipids (21.2%), 367g glucose (GIR 2.7), 2104 kcal) at 100% goal. Electrolytes in TPN: Na 150, K+ 35, Ca 5mE38m, Mg 9mEq85m and Phos 20mmo41m Cl:Ac 1:1 Add standard MVI and trace elements, FA  and thiamine to TPN Continue Moderate q4h SSI and adjust as needed TPN labs in AM   Hoyle Barkdull S. RobertAlford HighlandmD, BCPS Clinical Staff Pharmacist [email or web address] 03/07/2021 7:48 AM

## 2021-03-07 NOTE — Progress Notes (Signed)
PROGRESS NOTE        PATIENT DETAILS Name: Travis Conrad Age: 70 y.o. Sex: male Date of Birth: 10/02/1950 Admit Date: 02/19/2021 Admitting Physician Etta Quill, DO PCP:Sun, [name], MD  Brief Narrative: Patient is a 70 y.o. male A. fib, HTN, HLD, prior diverticulitis, heavy EtOH use-who presented with lower abdominal pain/nausea/vomiting-on initial presentation to the ED found to have severe sepsis physiology with hypotension/AKI-felt to be due to perforated diverticulitis.  See below for further details.  Significant events: 9/3>> presented with lower abdominal pain/vomiting-sepsis from contained perforated diverticulitis.  Found to have very active ileus/PSBO from diverticulitis/bowel inflammation 9/4>> A. fib with RVR-not responding to Lopressor/Cardizem-BP soft-started amiodarone infusion-cardiology consulted. 9/7>> increase in size of pelvic abscess on repeat CT. 9/8>> CT-guided drainage of pelvic abscess 9/9>> right testicular pain/swelling-scrotal ultrasound with Doppler pending.  Significant studies: 9/3>> CT abdomen/pelvis: Acute sigmoid diverticulitis-small extraluminal gas-fluid collection in the sigmoid mesocolon consistent with abscess.  Dilated proximal small bowel-likely ileus. 9/7>> CT abdomen/pelvis: Abscess has increased in size 9/7>> Echo: EF 60-65%. 9/16 >> CT scan suggestive of possible left psoas abscess  Antimicrobial therapy: Zosyn: 9/3>>  Microbiology data: 9/3>> COVID/influenza PCR: Negative 9/3>> blood culture: Negative 9/8>> culture pelvic abscess: Pending.  Procedures : 9/8>> CT-guided placement of 12 French drainage catheter into pelvic collection. 9/10>> s/p ex lap, sigmoid colectomy, end colostomy with enterorrhaphy and drainage of intraabdominal abscess - Dr. Kieth Brightly and Dr. Radene Knee 9/10  Consults: General surgery Cardiology IR Urology ID  DVT Prophylaxis : Place and maintain sequential compression device  Start: 03/07/21 0905 SCDs Start: 02/20/21 0002>> Heparin drip.   Subjective: Patient in recliner, appears comfortable, denies any headache, no fever, no chest pain or pressure, no shortness of breath , +ve abdominal pain. No new focal weakness.   Assessment/Plan:  Severe sepsis due to diverticulitis with contained perforation post laparotomy with colectomy on 02/26/2021 now evidence of left psoas fluid collection: Sepsis physiology has resolved-although continues to have leukocytosis - remains on Zosyn.  Underwent CT- guided drainage of pelvic abscess on 02/25/21 followed by ex.Laprotomy, sigmoid colectomy, end colostomy with enterorrhaphy and drainage of intraabdominal abscess - Dr. Kieth Brightly and Dr. Radene Knee  on 02/26/2021, continue TNA, NG tube to intermittent suction, CT noted on 03/04/21 - possible psoas hematoma/fluid collection - Defer to CCS/IR.  Discussed with cardiology since he is in sinus rhythm and there is a question of hematoma in the psoas area will hold heparin from 03/07/2019  10 am until H&H stabalizes.   Acute perioperative blood loss related anemia.  Transfuse 1 unit of packed RBC on 03/07/2021 and monitor.    Ileus: Now postop ileus continue NG tube, see #1 above.    PAF with RVR [place] vas 2 score of 3 : RVR provoked by sepsis/alcohol withdrawal-currently in sinus rhythm- now on PO Amio + Lopressor .  Cardiology following.  Anticoagulation on hold as in #1 above.  Right scrotal pain and swelling due to epididymitis : Has been placed on doxycycline in addition to Zosyn, symptoms improving continue to monitor, denies any exposure or unprotected sex in the last few years, no unusual discharge.  Uro & ID input appreciated, will change Doxy  stop date 03/08/21.  AKI: Hemodynamically mediated-improved-creatinine has now normalized.  Hypokalemia/hypophosphatemia: Repleted and has normalized.  Continue to follow periodically.  Alcohol withdrawal: Awake and alert this morning-last drink was  on 9/2, counseled to quit alcohol, no DTs, resolved.  HTN: Placed on oral beta-blocker + Norvasc,  DC Catapres, on PRN Hydralazine, pain control and monitor.  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   Diet: Diet Order             Diet NPO time specified Except for: Sips with Meds  Diet effective now                    Code Status:  Full code  Family Communication:  Spouse-Jimmi [phone] updated 03/02/21  Disposition Plan:  Status is: Inpatient  Remains inpatient appropriate because:Inpatient level of care appropriate due to severity of illness  Dispo: The patient is from: Home              Anticipated d/c is to: Home              Patient currently is not medically stable to d/c.   Difficult to place patient No   Barriers to Discharge: Perforated diverticulitis with abscess-on IV antibiotics-developing alcohol withdrawal symptoms/PAF with RVR.  Antimicrobial agents: Anti-infectives (From admission, onward)    Start     Dose/Rate Route Frequency Ordered Stop   02/26/21 0100  doxycycline (VIBRAMYCIN) 100 mg in sodium chloride 0.9 % 250 mL IVPB        100 mg 125 mL/hr over 120 Minutes Intravenous 2 times daily 02/26/21 0005 03/08/21 2359   02/20/21 0300  piperacillin-tazobactam (ZOSYN) IVPB 3.375 g        3.375 g 12.5 mL/hr over 240 Minutes Intravenous Every 8 hours 02/19/21 1907 03/08/21 2359   02/19/21 1900  ceFEPIme (MAXIPIME) 2 g in sodium chloride 0.9 % 100 mL IVPB  Status:  Discontinued        2 g 200 mL/hr over 30 Minutes Intravenous  Once 02/19/21 1857 02/19/21 1906   02/19/21 1900  metroNIDAZOLE (FLAGYL) IVPB 500 mg  Status:  Discontinued        500 mg 100 mL/hr over 60 Minutes Intravenous  Once 02/19/21 1857 02/19/21 1906   02/19/21 1815  piperacillin-tazobactam (ZOSYN) IVPB 3.375 g        3.375 g 100 mL/hr over 30 Minutes Intravenous  Once 02/19/21 1809 02/19/21  1944        Time spent: 35 minutes-Greater than 50% of this time was spent in counseling, explanation of diagnosis, planning of further management, and coordination of care.  MEDICATIONS: Scheduled Meds:  sodium chloride   Intravenous Once   amiodarone  200 mg Per NG tube BID   amLODipine  10 mg Per Tube Daily   Chlorhexidine Gluconate Cloth  6 each Topical Daily   insulin aspart  0-15 Units Subcutaneous Q4H   lidocaine  1 patch Transdermal Q24H   melatonin  3 mg Per Tube QHS   metoCLOPramide (REGLAN) injection  10 mg Intravenous Q6H   metoprolol tartrate  100 mg Per Tube BID   nicotine  21 mg Transdermal Daily   pantoprazole (PROTONIX) IV  40 mg Intravenous Q24H   sodium chloride flush  10 mL Intracatheter Q12H   Continuous Infusions:  sodium chloride Stopped (03/05/21 0320)   doxycycline (VIBRAMYCIN) IV Stopped (03/07/21 0025)   methocarbamol (ROBAXIN) IV 200 mL/hr at 03/07/21 0621   piperacillin-tazobactam (ZOSYN)  IV 12.5 mL/hr at 03/07/21 0621   TPN ADULT (ION) 85 mL/hr at 03/07/21 0621   TPN ADULT (ION)     PRN  Meds:.sodium chloride, butamben-tetracaine-benzocaine, hydrALAZINE, menthol-cetylpyridinium, metoprolol tartrate, morphine injection, [DISCONTINUED] ondansetron **OR** ondansetron (ZOFRAN) IV, oxyCODONE, phenol, zolpidem   PHYSICAL EXAM: Vital signs: Vitals:   03/06/21 2345 03/07/21 0342 03/07/21 0740 03/07/21 0808  BP: (!) 106/46  (!) 129/45 134/66  Pulse:   75 78  Resp:   19   Temp: 98.3 F (36.8 C) 98.2 F (36.8 C) 97.9 F (36.6 C)   TempSrc: Oral Oral Oral   SpO2:   98% 99%  Weight:  94 kg    Height:       Filed Weights   02/26/21 0913 03/06/21 0348 03/07/21 0342  Weight: 82.6 kg 85.6 kg 94 kg   Body mass index is 28.11 kg/m.   Exam  Awake Alert, No new F.N deficits, NG in place, colostomy in place, scrotal swelling slowly improving [place].AT,PERRAL Supple Neck,No JVD, No cervical lymphadenopathy appriciated.  Symmetrical Chest wall  movement, Good air movement bilaterally, CTAB RRR,No Gallops, Rubs or new Murmurs, No Parasternal Heave +ve B.Sounds, Abd Soft, No tenderness, No organomegaly appriciated, No rebound - guarding or rigidity. No Cyanosis, Clubbing or edema, No new Rash or bruise   I have personally reviewed following labs and imaging studies  LABORATORY DATA:  Recent Labs  Lab 03/01/21 1901 03/02/21 0659 03/04/21 0940 03/04/21 1120 03/05/21 0129 03/06/21 0131 03/07/21 0412  WBC 14.7*   < > 15.5* 19.1* 21.6* 19.2* 13.8*  HGB 11.2*   < > 7.8* 8.7* 8.7* 7.7* 6.7*  HCT 33.9*   < > 24.3* 25.5* 25.3* 22.1* 19.8*  PLT 432*   < > 356 383 397 368 351  MCV 103.4*   < > 112.0* 102.8* 100.4* 100.9* 102.6*  MCH 34.1*   < > 35.9* 35.1* 34.5* 35.2* 34.7*  MCHC 33.0   < > 32.1 34.1 34.4 34.8 33.8  RDW 14.1   < > 15.5 14.3 14.3 14.4 14.6  LYMPHSABS 2.5  --   --   --   --   --   --   MONOABS 1.3*  --   --   --   --   --   --   EOSABS 0.0  --   --   --   --   --   --   BASOSABS 0.1  --   --   --   --   --   --    < > = values in this interval not displayed.    Recent Labs  Lab 03/01/21 0441 03/01/21 0441 03/03/21 0546 03/04/21 0940 03/04/21 1120 03/05/21 0129 03/06/21 0131 03/06/21 0732 03/07/21 0412  NA 134*  --  132*  --  131* 128*  --  130* 130*  K 4.2  --  4.7  --  4.9 4.7  --  4.8 4.6  CL 102  --  101  --  101 97*  --  102 100  CO2 24  --  24  --  23 21*  --  21* 22  GLUCOSE 147*  --  178*  --  111* 166*  --  145* 137*  BUN 15  --  16  --  17 17  --  28* 27*  CREATININE 0.82  --  0.83  --  0.86 0.89  --  1.01 1.05  CALCIUM 8.0*  --  8.1*  --  7.9* 8.4*  --  8.0* 8.0*  AST  --   --  15  --  12* 12*  --   --  13*  ALT  --   --  11  --  9 11  --   --  13  ALKPHOS  --   --  53  --  60 68  --   --  78  BILITOT  --   --  0.3  --  0.5 0.5  --   --  0.6  ALBUMIN  --   --  1.6*  --  1.6* 1.6*  --   --  1.3*  MG 2.0  --  2.1  --   --  2.1  --   --  2.1  PROCALCITON  --    < > <0.10 41.97 <0.10 <0.10  <0.10  --  <0.10   < > = values in this interval not displayed.     RADIOLOGY STUDIES/RESULTS: DG Abd 1 View  Result Date: 03/05/2021 CLINICAL DATA: Tube placement. EXAM: ABDOMEN - 1 VIEW COMPARISON:  Abdominal x-ray 03/05/2021. FINDINGS: Enteric tube is coiled in the proximal stomach with distal tip just beyond the gastroesophageal junction. Dilated small bowel in the upper abdomen persists compatible with small bowel obstruction versus ileus. Lung bases are grossly clear. IMPRESSION: 1. Enteric tube tip coiled in the proximal stomach. Electronically Signed   By: Ronney Asters M.D.   On: 03/05/2021 15:55   DG Abd Portable 1V  Result Date: 03/05/2021 CLINICAL DATA:  Nasogastric tube placement EXAM: PORTABLE ABDOMEN - 1 VIEW COMPARISON:  02/26/2021 FINDINGS: Interval advancement of nasogastric tube which is now coiled in the proximal stomach. Limited visualization upper small bowel shows persistent gaseous distension consistent with ileus or partial small bowel obstruction. IMPRESSION: Nasogastric tube is coiled in the proximal stomach. Electronically Signed   By: Aletta Edouard M.D.   On: 03/05/2021 11:45     LOS: 15 days   Signature  Lala Lund M.D on 03/07/2021 at 9:06 AM   -  To page go to [email or web address]    03/07/2021, 9:06 AM

## 2021-03-07 NOTE — Progress Notes (Addendum)
PHARMACY CONSULT NOTE FOR:  OUTPATIENT  PARENTERAL ANTIBIOTIC THERAPY (OPAT)  Indication: Complicated diverticulitis with intra abdominal abscess Regimen: Piperacillin/tazobactam 13.5gm IV q24h as a continuous infusion End date: 04/04/21  IV antibiotic discharge orders are pended. To discharging provider:  please sign these orders via discharge navigator,  Select New Orders & click on the button choice - Manage This Unsigned Work.     Thank you for allowing pharmacy to be a part of this patient's care.  Lestine Box, PharmD PGY2 Infectious Diseases Pharmacy Resident   Please check [email or web address] for unit-specific pharmacy phone numbers

## 2021-03-07 NOTE — Progress Notes (Addendum)
[facility]  Date of Admission:  02/19/2021      Lines: 9/8-c rue picc  Abx: 9/10-c doxycycline 9/04-c piptazo                                                                 Assessment: -left knee pain/swelling -Complicated diverticulitis with intraabd abscess -S/p ir placed drain into abscess 9/08 -S/p exlap, sigmoid collectomy, and washout with diverting colostomy 9/10 due to progression of disease -On tpn -ileus  -epididymitis   9/08 ir drain cx no esbl or yeast/enterococcus Afebrile this admission. Wbc trending down again since surgery  Ileus awaiting repeat abd ct   Repeat abd ct 9/18 showed resolved previous abscess, but now new fluid collection left iliopsoas area. Unclear if post-op hematoma, but would have to presume infected in the setting previous abscess on proximal location/bowel surgery.   The epididymitis suspect similar spectrum of bacteria as coliform type, given his monogamous relationship and age. Urine gc/chlamydia negative. No risk factor sti.   Left knee pain ?crystal arthropathy vs OA vs septic arthritis   Plan: Continue piptazo Given new ct finding, plan 4 weeks until 10/17 and reevaluate in ID clinic Can stop doxycycline Opat and id clinic f/u as belowed Please discuss with ir/ortho regarding arthrocentesis left knee and send for cell count/culture/crystal analysis Will continue to follow Discussed with primary team  OPAT Orders Discharge antibiotics to be given via PICC line  [place] Per Protocol:  Home health RN for IV administration and teaching; PICC line care and labs.    Labs weekly while on IV antibiotics: _x_ CBC with differential __ BMP _x_ CMP _x_ CRP __ ESR __ Vancomycin trough __ CK  __ Please pull PIC at completion of IV antibiotics __ Please leave PIC in place until doctor has seen patient or been notified  Fax weekly labs to [phone]  Clinic Follow Up Appt: 10/26 @ 230    @  [facility] [place], [place], [place] [address] Phone: [phone]   I spent more than 35 minute reviewing data/chart, and coordinating care and >50% direct face to face time providing counseling/discussing diagnostics/treatment plan with patient   Principal Problem:   Diverticulitis of large intestine with perforation and abscess without bleeding Active Problems:   Atrial fibrillation, chronic (HCC)   Essential hypertension   Diverticulitis   AKI (acute kidney injury) ([facility])   Severe sepsis with acute organ dysfunction (HCC)   Intra-abdominal abscess (HCC)   Epididymitis   Allergies  Allergen Reactions   Atorvastatin Other (See Comments)    myalgias    Scheduled Meds:  amiodarone  200 mg Per NG tube BID   amLODipine  10 mg Per Tube Daily   Chlorhexidine Gluconate Cloth  6 each Topical Daily   insulin aspart  0-15 Units Subcutaneous Q4H   lidocaine  1 patch Transdermal Q24H   melatonin  3 mg Per Tube QHS   metoCLOPramide (REGLAN) injection  10 mg Intravenous Q6H   metoprolol tartrate  100 mg Per Tube BID   nicotine  21 mg Transdermal Daily   pantoprazole (PROTONIX) IV  40 mg Intravenous Q24H   sodium chloride flush  10 mL Intracatheter Q12H  Continuous Infusions:  sodium chloride Stopped (03/05/21 0320)   doxycycline (VIBRAMYCIN) IV 100 mg (03/07/21 0934)   methocarbamol (ROBAXIN) IV 200 mL/hr at 03/07/21 0621   piperacillin-tazobactam (ZOSYN)  IV 3.375 g (03/07/21 1211)   TPN ADULT (ION) 85 mL/hr at 03/07/21 0621   TPN ADULT (ION)     PRN Meds:.sodium chloride, butamben-tetracaine-benzocaine, hydrALAZINE, menthol-cetylpyridinium, metoprolol tartrate, morphine injection, [DISCONTINUED] ondansetron **OR** ondansetron (ZOFRAN) IV, oxyCODONE, phenol, zolpidem   SUBJECTIVE: Complains of pain right scrotum and abd still but not worse than previous, perhaps some improvement No f/c Colostomy not functioning yet No rash No n/v Repeat abd ct  reviewed with patient new left pelvis fluid collection   Review of Systems: ROS All other ROS was negative, except mentioned above     OBJECTIVE: Vitals:   03/07/21 0808 03/07/21 0915 03/07/21 1010 03/07/21 1133  BP: 134/66 (!) 141/61 (!) 121/53 (!) 116/45  Pulse: 78 80 72 73  Resp:  _0 Temp:  98.1 F (36.7 C) 98 F (36.7 C) 97.8 F (36.6 C)  TempSrc:  Oral Oral Oral  SpO2: 99% 98% 97% 97%  Weight:      Height:       Body mass index is 28.11 kg/m.  Physical Exam General/constitutional: no distress, pleasant HEENT: Normocephalic, PER, Conj Clear, EOMI, Oropharynx clear Neck supple CV: rrr no mrg Lungs: clear to auscultation, normal respiratory effort Abd: Soft, mild tenderness; no rebound; colostomy with some air; no erythema around incision/wound GU: right scrotal swelling stable along with moderate tenderness Ext: no edema Skin: No Rash Neuro: nonfocal MSK: no peripheral joint swelling/tenderness/warmth; back spines nontender   Central line presence: rue picc site no erythema/tenderness   Lab Results Lab Results  Component Value Date   WBC 13.8 (H) 03/07/2021   HGB 6.7 (LL) 03/07/2021   HCT 19.8 (L) 03/07/2021   MCV 102.6 (H) 03/07/2021   PLT 351 03/07/2021    Lab Results  Component Value Date   CREATININE 1.05 03/07/2021   BUN 27 (H) 03/07/2021   NA 130 (L) 03/07/2021   K 4.6 03/07/2021   CL 100 03/07/2021   CO2 22 03/07/2021    Lab Results  Component Value Date   ALT 13 03/07/2021   AST 13 (L) 03/07/2021   ALKPHOS 78 03/07/2021   BILITOT 0.6 03/07/2021      Microbiology: No results found for this or any previous visit (from the past 240 hour(s)).   Serology:   Imaging: If present, new imagings (plain films, ct scans, and mri) have been personally visualized and interpreted; radiology reports have been reviewed. Decision making incorporated into the Impression / Recommendations.  9/16 abd pelv ct 1. Large heterogeneous left  iliopsoas collection may represent abscess, hematoma, or postoperative fluid collection. Stranding in the retroperitoneum and left pararenal fat as well as subcutaneous tissues could be inflammatory or contusion. 2. Interval postoperative changes with left lower quadrant colostomy and left lower quadrant pelvic drainage catheter. The previous diverticular abscess has resolved. 3. Mild residual distention of the stomach and proximal small bowel. An enteric tube is present with tip just below the level of the EG junction. Advancement of 4-5 cm may be useful. 4. Small bilateral pleural effusions with basilar atelectasis. 5. Additional incidental findings as above.  Jabier Mutton, [facility] [place] [phone] pager    03/07/2021, 12:35 PM

## 2021-03-08 DIAGNOSIS — K5720 Diverticulitis of large intestine with perforation and abscess without bleeding: Secondary | ICD-10-CM | POA: Diagnosis not present

## 2021-03-08 DIAGNOSIS — I482 Chronic atrial fibrillation, unspecified: Secondary | ICD-10-CM | POA: Diagnosis not present

## 2021-03-08 LAB — CBC
HCT: 24 % — ABNORMAL LOW (ref 39.0–52.0)
HCT: 28 % — ABNORMAL LOW (ref 39.0–52.0)
Hemoglobin: 8.1 g/dL — ABNORMAL LOW (ref 13.0–17.0)
Hemoglobin: 9.5 g/dL — ABNORMAL LOW (ref 13.0–17.0)
MCH: 33.9 pg (ref 26.0–34.0)
MCH: 34.1 pg — ABNORMAL HIGH (ref 26.0–34.0)
MCHC: 33.8 g/dL (ref 30.0–36.0)
MCHC: 33.9 g/dL (ref 30.0–36.0)
MCV: 100.4 fL — ABNORMAL HIGH (ref 80.0–100.0)
MCV: 100.4 fL — ABNORMAL HIGH (ref 80.0–100.0)
Platelets: 361 10*3/uL (ref 150–400)
Platelets: 425 10*3/uL — ABNORMAL HIGH (ref 150–400)
RBC: 2.39 MIL/uL — ABNORMAL LOW (ref 4.22–5.81)
RBC: 2.79 MIL/uL — ABNORMAL LOW (ref 4.22–5.81)
RDW: 15 % (ref 11.5–15.5)
RDW: 14.9 % (ref 11.5–15.5)
WBC: 12.7 10*3/uL — ABNORMAL HIGH (ref 4.0–10.5)
WBC: 15.3 10*3/uL — ABNORMAL HIGH (ref 4.0–10.5)
nRBC: 0 % (ref 0.0–0.2)
nRBC: 0.1 % (ref 0.0–0.2)

## 2021-03-08 LAB — SYNOVIAL CELL COUNT + DIFF, W/ CRYSTALS
Crystals, Fluid: NONE SEEN
Eosinophils-Synovial: 0 % (ref 0–1)
Lymphocytes-Synovial Fld: 2 % (ref 0–20)
Monocyte-Macrophage-Synovial Fluid: 63 % (ref 50–90)
Neutrophil, Synovial: 35 % — ABNORMAL HIGH (ref 0–25)
WBC, Synovial: 610 /mm3 — ABNORMAL HIGH (ref 0–200)

## 2021-03-08 LAB — BPAM RBC
Blood Product Expiration Date: 202209222359
ISSUE DATE / TIME: 202209190946
Unit Type and Rh: 6200

## 2021-03-08 LAB — TYPE AND SCREEN
ABO/RH(D): A POS
Antibody Screen: NEGATIVE
Unit division: 0

## 2021-03-08 LAB — GLUCOSE, CAPILLARY
Glucose-Capillary: 133 mg/dL — ABNORMAL HIGH (ref 70–99)
Glucose-Capillary: 134 mg/dL — ABNORMAL HIGH (ref 70–99)
Glucose-Capillary: 159 mg/dL — ABNORMAL HIGH (ref 70–99)
Glucose-Capillary: 162 mg/dL — ABNORMAL HIGH (ref 70–99)
Glucose-Capillary: 165 mg/dL — ABNORMAL HIGH (ref 70–99)
Glucose-Capillary: 167 mg/dL — ABNORMAL HIGH (ref 70–99)

## 2021-03-08 LAB — COMPREHENSIVE METABOLIC PANEL WITH GFR
ALT: 13 U/L (ref 0–44)
AST: 16 U/L (ref 15–41)
Albumin: 1.7 g/dL — ABNORMAL LOW (ref 3.5–5.0)
Alkaline Phosphatase: 104 U/L (ref 38–126)
Anion gap: 8 (ref 5–15)
BUN: 26 mg/dL — ABNORMAL HIGH (ref 8–23)
CO2: 22 mmol/L (ref 22–32)
Calcium: 8.5 mg/dL — ABNORMAL LOW (ref 8.9–10.3)
Chloride: 102 mmol/L (ref 98–111)
Creatinine, Ser: 0.96 mg/dL (ref 0.61–1.24)
GFR, Estimated: >60 mL/min
Glucose, Bld: 137 mg/dL — ABNORMAL HIGH (ref 70–99)
Potassium: 4.6 mmol/L (ref 3.5–5.1)
Sodium: 132 mmol/L — ABNORMAL LOW (ref 135–145)
Total Bilirubin: 1 mg/dL (ref 0.3–1.2)
Total Protein: 5.9 g/dL — ABNORMAL LOW (ref 6.5–8.1)

## 2021-03-08 LAB — PROCALCITONIN: Procalcitonin: 8.1 ng/mL

## 2021-03-08 LAB — URIC ACID: Uric Acid, Serum: 1 mg/dL — ABNORMAL LOW (ref 3.7–8.6)

## 2021-03-08 MED ORDER — TRAVASOL 10 % IV SOLN
INTRAVENOUS | Status: AC
  Filled 2021-03-08: qty 1020

## 2021-03-08 NOTE — Progress Notes (Signed)
PROGRESS NOTE        PATIENT DETAILS Name: Travis Conrad Age: 70 y.o. Sex: male Date of Birth: 12-30-1950 Admit Date: 02/19/2021 Admitting Physician Etta Quill, DO PCP:Sun, [name], MD  Brief Narrative: Patient is a 69 y.o. male A. fib, HTN, HLD, prior diverticulitis, heavy EtOH use-who presented with lower abdominal pain/nausea/vomiting-on initial presentation to the ED found to have severe sepsis physiology with hypotension/AKI-felt to be due to perforated diverticulitis.  He subsequently underwent laparotomy with sigmoid colectomy and colostomy placement, he still has postop ileus, subsequently has also developed epididymitis for which she has been seen by urology and ID, currently has developed left knee pain for which Ortho is seeing the patient and planning to do arthrocentesis on 03/08/2021.  This clinically appears to be either osteoarthritis or gout/pseudogout.  Significant events: 9/3>> presented with lower abdominal pain/vomiting-sepsis from contained perforated diverticulitis.  Found to have very active ileus/PSBO from diverticulitis/bowel inflammation 9/4>> A. fib with RVR-not responding to Lopressor/Cardizem-BP soft-started amiodarone infusion-cardiology consulted. 9/7>> increase in size of pelvic abscess on repeat CT. 9/8>> CT-guided drainage of pelvic abscess 9/9>> right testicular pain/swelling-scrotal ultrasound with Doppler pending.  Significant studies: 9/3>> CT abdomen/pelvis: Acute sigmoid diverticulitis-small extraluminal gas-fluid collection in the sigmoid mesocolon consistent with abscess.  Dilated proximal small bowel-likely ileus. 9/7>> CT abdomen/pelvis: Abscess has increased in size 9/7>> Echo: EF 60-65%. 9/16 >> CT scan suggestive of possible left psoas fluid collection 9/20 >> L knee arthrocentesis scheduled  Antimicrobial therapy: Zosyn: 9/3>>  Microbiology data: 9/3>> COVID/influenza PCR: Negative 9/3>> blood culture:  Negative 9/8>> culture pelvic abscess: Pending.  Procedures : 9/8>> CT-guided placement of 12 French drainage catheter into pelvic collection. 9/10>> s/p ex lap, sigmoid colectomy, end colostomy with enterorrhaphy and drainage of intraabdominal abscess - Dr. Kieth Brightly and Dr. Radene Knee 9/10  Consults: General surgery Cardiology IR Urology ID Ortho  DVT Prophylaxis : Place and maintain sequential compression device Start: 03/07/21 0905 SCDs Start: 02/20/21 0002>> Heparin drip.   Subjective: Patient in bed, appears comfortable, denies any headache, no fever, no chest pain or pressure, no shortness of breath , +ve abdominal pain. No new focal weakness, mild L. Knee pain, no back pain or numbeness.    Assessment/Plan:  Severe sepsis due to diverticulitis with contained perforation post laparotomy with colectomy on 02/26/2021 now evidence of left psoas fluid collection: Sepsis physiology has resolved-although continues to have leukocytosis - remains on Zosyn.  Underwent CT- guided drainage of pelvic abscess on 02/25/21 followed by ex.Laprotomy, sigmoid colectomy, end colostomy with enterorrhaphy and drainage of intraabdominal abscess - Dr. Kieth Brightly and Dr. Radene Knee  on 02/26/2021, continue TNA, NG tube to intermittent suction, CT noted on 03/04/21 - possible psoas hematoma/fluid collection - Defer to CCS/IR.  Discussed with cardiology since he is in sinus rhythm and there is a question of hematoma in the psoas area will hold heparin from 03/07/2019  10 am until H&H stabalizes for a few days.   Acute perioperative blood loss related anemia.  Transfuse 1 unit of packed RBC on 03/07/2021 and monitor.    Ileus: Now postop ileus continue NG tube, see #1 above.    PAF with RVR [place] vas 2 score of 3 : RVR provoked by sepsis/alcohol withdrawal-currently in sinus rhythm- now on PO Amio + Lopressor .  Cardiology following.  Anticoagulation on hold as in #1 above.  Right  scrotal pain and swelling due to  epididymitis : Has been placed on doxycycline in addition to Zosyn, symptoms improving continue to monitor, denies any exposure or unprotected sex in the last few years, no unusual discharge.  Uro & ID input appreciated, will change Doxy  stop date 03/08/21.  L Knee pain and swelling  - X Ray suggestive of possible osteoarthritis, there is no warmth or redness, he is on antibiotics and was never bacteremic hence do not think this is infectious, Ortho on board going to tap the knee, 03/08/2021, this could be due to osteoarthritis or gout.  Will monitor.  AKI: Hemodynamically mediated-improved-creatinine has now normalized.  Hypokalemia/hypophosphatemia: Repleted and has normalized.  Continue to follow periodically.  Alcohol withdrawal: Resolved, counseled to quit alcohol, no DTs.  HTN: Placed on oral beta-blocker + Norvasc + PRN Hydralazine, pain control and monitor.  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   Diet: Diet Order             Diet NPO time specified Except for: Sips with Meds  Diet effective now                    Code Status:  Full code  Family Communication:  Spouse-Jimmi Boodram-[phone] updated 03/02/21  Disposition Plan:  Status is: Inpatient  Remains inpatient appropriate because:Inpatient level of care appropriate due to severity of illness  Dispo: The patient is from: Home              Anticipated d/c is to: Home              Patient currently is not medically stable to d/c.   Difficult to place patient No   Barriers to Discharge: Perforated diverticulitis with abscess-on IV antibiotics-developing alcohol withdrawal symptoms/PAF with RVR.  Antimicrobial agents: Anti-infectives (From admission, onward)    Start     Dose/Rate Route Frequency Ordered Stop   02/26/21 0100  doxycycline (VIBRAMYCIN) 100 mg in sodium chloride 0.9 % 250 mL IVPB        100 mg 125 mL/hr over  120 Minutes Intravenous 2 times daily 02/26/21 0005 03/08/21 2359   02/20/21 0300  piperacillin-tazobactam (ZOSYN) IVPB 3.375 g        3.375 g 12.5 mL/hr over 240 Minutes Intravenous Every 8 hours 02/19/21 1907 03/08/21 2359   02/19/21 1900  ceFEPIme (MAXIPIME) 2 g in sodium chloride 0.9 % 100 mL IVPB  Status:  Discontinued        2 g 200 mL/hr over 30 Minutes Intravenous  Once 02/19/21 1857 02/19/21 1906   02/19/21 1900  metroNIDAZOLE (FLAGYL) IVPB 500 mg  Status:  Discontinued        500 mg 100 mL/hr over 60 Minutes Intravenous  Once 02/19/21 1857 02/19/21 1906   02/19/21 1815  piperacillin-tazobactam (ZOSYN) IVPB 3.375 g        3.375 g 100 mL/hr over 30 Minutes Intravenous  Once 02/19/21 1809 02/19/21 1944        Time spent: 35 minutes-Greater than 50% of this time was spent in counseling, explanation of diagnosis, planning of further management, and coordination of care.  MEDICATIONS: Scheduled Meds:  amiodarone  200 mg Per NG tube BID   amLODipine  10 mg Per Tube Daily   bupivacaine  10 mL Infiltration Once   Chlorhexidine Gluconate Cloth  6 each Topical Daily   insulin aspart  0-15 Units Subcutaneous Q4H   lidocaine  1 patch Transdermal Q24H   melatonin  3 mg Per Tube QHS   metoCLOPramide (REGLAN) injection  10 mg Intravenous Q6H   metoprolol tartrate  100 mg Per Tube BID   nicotine  21 mg Transdermal Daily   pantoprazole (PROTONIX) IV  40 mg Intravenous Q24H   sodium chloride flush  10 mL Intracatheter Q12H   Continuous Infusions:  sodium chloride Stopped (03/05/21 0320)   doxycycline (VIBRAMYCIN) IV 100 mg (03/08/21 0012)   methocarbamol (ROBAXIN) IV 1,000 mg (03/08/21 0521)   piperacillin-tazobactam (ZOSYN)  IV 3.375 g (03/08/21 0249)   TPN ADULT (ION) 85 mL/hr at 03/07/21 1751   TPN ADULT (ION)     PRN Meds:.sodium chloride, butamben-tetracaine-benzocaine, hydrALAZINE, menthol-cetylpyridinium, metoprolol tartrate, morphine injection, [DISCONTINUED] ondansetron  **OR** ondansetron (ZOFRAN) IV, oxyCODONE, phenol, zolpidem   PHYSICAL EXAM: Vital signs: Vitals:   03/08/21 0358 03/08/21 0400 03/08/21 0600 03/08/21 0722  BP: (!) 127/53 (!) 118/56 (!) 123/48 (!) 114/52  Pulse: 77 78 76 77  Resp: 17 20 17 16   Temp: 97.7 F (36.5 C)   98 F (36.7 C)  TempSrc: Axillary   Oral  SpO2: 97% 99% 98% 96%  Weight:      Height:       Filed Weights   02/26/21 0913 03/06/21 0348 03/07/21 0342  Weight: 82.6 kg 85.6 kg 94 kg   Body mass index is 28.11 kg/m.   Exam  Awake Alert, No new F.N deficits, NG in place, colostomy in place, scrotal swelling slowly improving Tice.AT,PERRAL Supple Neck,No JVD, No cervical lymphadenopathy appriciated.  Symmetrical Chest wall movement, Good air movement bilaterally, CTAB RRR,No Gallops, Rubs or new Murmurs, No Parasternal Heave No B.Sounds, +ve  tenderness,   L. Knee mild effusion, some joint line tenderness, no warmth or redness    I have personally reviewed following labs and imaging studies  LABORATORY DATA:  Recent Labs  Lab 03/01/21 1901 03/02/21 0659 03/04/21 1120 03/05/21 0129 03/06/21 0131 03/07/21 0412 03/07/21 1730 03/08/21 0026  WBC 14.7*   < > 19.1* 21.6* 19.2* 13.8*  --  12.7*  HGB 11.2*   < > 8.7* 8.7* 7.7* 6.7* 8.1* 8.1*  HCT 33.9*   < > 25.5* 25.3* 22.1* 19.8* 23.7* 24.0*  PLT 432*   < > 383 397 368 351  --  361  MCV 103.4*   < > 102.8* 100.4* 100.9* 102.6*  --  100.4*  MCH 34.1*   < > 35.1* 34.5* 35.2* 34.7*  --  33.9  MCHC 33.0   < > 34.1 34.4 34.8 33.8  --  33.8  RDW 14.1   < > 14.3 14.3 14.4 14.6  --  15.0  LYMPHSABS 2.5  --   --   --   --   --   --   --   MONOABS 1.3*  --   --   --   --   --   --   --   EOSABS 0.0  --   --   --   --   --   --   --   BASOSABS 0.1  --   --   --   --   --   --   --    < > = values in this interval not displayed.    Recent Labs  Lab 03/03/21 0546 03/04/21 0940 03/04/21 1120 03/05/21 0129 03/06/21 0131 03/06/21 0732 03/07/21 0412  03/07/21 1730 03/08/21 0026  NA 132*  --  131* 128*  --  130* 130*  --   --   K 4.7  --  4.9 4.7  --  4.8 4.6  --   --   CL 101  --  101 97*  --  102 100  --   --   CO2 24  --  23 21*  --  21* 22  --   --   GLUCOSE 178*  --  111* 166*  --  145* 137*  --   --   BUN 16  --  17 17  --  28* 27*  --   --   CREATININE 0.83  --  0.86 0.89  --  1.01 1.05  --   --   CALCIUM 8.1*  --  7.9* 8.4*  --  8.0* 8.0*  --   --   AST 15  --  12* 12*  --   --  13*  --   --   ALT 11  --  9 11  --   --  13  --   --   ALKPHOS 53  --  60 68  --   --  78  --   --   BILITOT 0.3  --  0.5 0.5  --   --  0.6  --   --   ALBUMIN 1.6*  --  1.6* 1.6*  --   --  1.3*  --   --   MG 2.1  --   --  2.1  --   --  2.1  --   --   CRP  --   --   --   --   --   --   --  15.4*  --   PROCALCITON <0.10   < > <0.10 <0.10 <0.10  --  <0.10  --  8.10   < > = values in this interval not displayed.     RADIOLOGY STUDIES/RESULTS: DG Knee Complete 4 Views Left  Result Date: 03/07/2021 CLINICAL DATA:  70 year old male with history of left lateral knee pain. EXAM: LEFT KNEE - COMPLETE 4+ VIEW COMPARISON:  No priors. FINDINGS: No evidence of fracture, dislocation, or joint effusion. Joint space narrowing, subchondral sclerosis, subchondral cyst formation and osteophyte formation is noted, most evident in the patellofemoral compartment. Numerous vascular calcifications are noted. Soft tissues are otherwise unremarkable. IMPRESSION: 1. No acute radiographic abnormality of the left knee. 2. 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 Electronically Signed   By: Vinnie Langton M.D.   On: 03/07/2021 19:04     LOS: 16 days   Signature  Lala Lund M.D on 03/08/2021 at 8:43 AM   -  To page go to [email or web address]    03/08/2021, 8:43 AM

## 2021-03-08 NOTE — Progress Notes (Signed)
PHARMACY - TOTAL PARENTERAL NUTRITION CONSULT NOTE   Indication: Prolonged ileus  Patient Measurements: Height: 6' (182.9 cm) Weight: 94 kg (207 lb 3.7 oz) IBW/kg (Calculated) : 77.6 TPN AdjBW (KG): 82.6 Body mass index is 28.11 kg/m.  Assessment: 12 YOM with lower abdominal pain/N/V found to have perforated diverticulitis. CT abd on 9/3 found to have active ileus/pSBO from diverticulitis/bowel inflammation. Pharmacy consulted to start TPN for prolonged ileus. Of note patient has a h/o alcohol dependency and is at high risk for refeeding.   Glucose / Insulin: no hx DM, A1C 5.9, CBGs 124-153. SSI/24h: 13 units Electrolytes: Na 130, K 4.6 (goal >/=4 with ileus), Mg 2.1 (goal >/=2 with ileus), others wnl Renal: SCr 1 stable, BUN 27 Hepatic: LFTs / Tbili / TG wnl, Albumin 1.3 down Intake / Output; MIVF: NG 1054ml>>200ml last 24h, Drain output 285 mL up; none from colostomy, UOP 1650ml (0.7) , LBM 9/6 GI Meds: IV Reglan/6hr, PPI QP/59F ID: Complicated diverticulitis with intra abdominal abscess. Plan discharge on IV Zosyn.  GI Imaging:  - 9/3 CT abd: Active ileus/PSBO - 9/16 CT abd: L-iliopsoas fluid collection (abscess or hematoma), resolution of prior diverticular abscess, still w/ evidence of ileus  GI Surgeries / Procedures:  - 9/10 ex-lap, sigmoid colectomy w/ end colostomy creation, enterorrhaphy, drainage of intra-abd abscess, wound vac placement  Central access: PICC line 9/8 >>  TPN start date: 9/9>>   Nutritional Goals: Goal TPN rate is 85 mL/hr (provides 102 g of protein and 2104 kcals per day)  RD Assessment: Estimated Needs Total Energy Estimated Needs: 2000-2200 Total Protein Estimated Needs: 100-110 grams Total Fluid Estimated Needs: >2L  Current Nutrition:  TPN; NPO  Plan:  Continue TPN at goal rate 9mL/hr at 1800 (102g AA, 44.8g lipids (21.2%), 367g glucose (GIR 2.7), 2104 kcal) at 100% goal. Electrolytes in TPN: Na 150, K+ 35, Ca 16mEq/L, Mg 32mEq/L, and Phos  22mmol/L. Cl:Ac 1:1 Add standard MVI and trace elements, FA and thiamine to TPN Continue Moderate q4h SSI and adjust as needed Add 5 units insulin to TPN bag. TPN labs in AM   Aubrielle Stroud S. Alford Highland, PharmD, BCPS Clinical Staff Pharmacist [email or web address] 03/08/2021 7:25 AM

## 2021-03-08 NOTE — Progress Notes (Signed)
[facility] Infectious Disease  Date of Admission:  02/19/2021      Lines: 9/8-c rue picc  Abx: 9/10-c doxycycline 9/04-c piptazo                                                                 Assessment: -left knee pain/swelling -Complicated diverticulitis with intraabd abscess -S/p ir placed drain into abscess 9/08 -S/p exlap, sigmoid collectomy, and washout with diverting colostomy 9/10 due to progression of disease -On tpn -ileus  -epididymitis   9/08 ir drain cx no esbl or yeast/enterococcus Afebrile this admission. Wbc trending down again since surgery  Ileus awaiting repeat abd ct   Repeat abd ct 9/18 showed resolved previous abscess, but now new fluid collection left iliopsoas area. Unclear if post-op hematoma, but would have to presume infected in the setting previous abscess on proximal location/bowel surgery.   The epididymitis suspect similar spectrum of bacteria as coliform type, given his monogamous relationship and age. Urine gc/chlamydia negative. No risk factor sti.   Left knee pain ?crystal arthropathy vs OA vs septic arthritis   -------- 9/20 assessment 9/19 noted hemoglobin drops to 6's. No obvious source gi/external bleed. The surgical jp drain is draining serosanguious fluid. If hct continues to drop likely need repeat imaging. No objective sign of hemolysis Await left knee fluid analysis. Not as tender today on exam   Plan: Continue piptazo Follow cbc, will see if another abd/pelv ct is indicated early given dropping hct of no obvious cause at this time F/u left knee joint fluid analysis/cx/crystal exam Given the 9/16 ct finding nonspecific fluid collection (not sampled), plan 4 weeks until 10/17 and reevaluate in ID clinic Opat and id clinic f/u as belowed Will continue to follow Discussed with primary team   I spent more than 35 minute reviewing data/chart, and coordinating care and >50% direct face to face time providing  counseling/discussing diagnostics/treatment plan with patient   OPAT Orders Discharge antibiotics to be given via PICC line  [place] Per Protocol:  Home health RN for IV administration and teaching; PICC line care and labs.    Labs weekly while on IV antibiotics: _x_ CBC with differential __ BMP _x_ CMP _x_ CRP __ ESR __ Vancomycin trough __ CK  __ Please pull PIC at completion of IV antibiotics __ Please leave PIC in place until doctor has seen patient or been notified  Fax weekly labs to [phone]  Clinic Follow Up Appt: 10/26 @ 230   @  [facility] [place], [place], [place] [address] Phone: [phone]     Principal Problem:   Diverticulitis of large intestine with perforation and abscess without bleeding Active Problems:   Diverticulitis of intestine with abscess   Atrial fibrillation, chronic ([place])   Essential hypertension   Diverticulitis   AKI (acute kidney injury) ([place])   Severe sepsis with acute organ dysfunction (HCC)   Intra-abdominal abscess (HCC)   Epididymitis   Allergies  Allergen Reactions   Atorvastatin Other (See Comments)    myalgias    Scheduled Meds:  amiodarone  200 mg Per NG tube BID   amLODipine  10 mg Per Tube Daily   bupivacaine  10 mL Infiltration Once  Chlorhexidine Gluconate Cloth  6 each Topical Daily   insulin aspart  0-15 Units Subcutaneous Q4H   lidocaine  1 patch Transdermal Q24H   melatonin  3 mg Per Tube QHS   metoCLOPramide (REGLAN) injection  10 mg Intravenous Q6H   metoprolol tartrate  100 mg Per Tube BID   nicotine  21 mg Transdermal Daily   pantoprazole (PROTONIX) IV  40 mg Intravenous Q24H   sodium chloride flush  10 mL Intracatheter Q12H   Continuous Infusions:  sodium chloride Stopped (03/05/21 0320)   doxycycline (VIBRAMYCIN) IV 100 mg (03/08/21 0912)   methocarbamol (ROBAXIN) IV 1,000 mg (03/08/21 1224)   piperacillin-tazobactam (ZOSYN)  IV 3.375 g (03/08/21 0914)   TPN ADULT  (ION) 85 mL/hr at 03/07/21 1751   TPN ADULT (ION)     PRN Meds:.sodium chloride, butamben-tetracaine-benzocaine, hydrALAZINE, menthol-cetylpyridinium, metoprolol tartrate, morphine injection, [DISCONTINUED] ondansetron **OR** ondansetron (ZOFRAN) IV, oxyCODONE, phenol, zolpidem   SUBJECTIVE: Complains of pain right scrotum and abd still but not worse than previous, perhaps some improvement No f/c Colostomy not functioning yet No rash No n/v Repeat abd ct reviewed with patient new left pelvis fluid collection   Review of Systems: ROS All other ROS was negative, except mentioned above     OBJECTIVE: Vitals:   03/08/21 0400 03/08/21 0600 03/08/21 0722 03/08/21 0901  BP: (!) 118/56 (!) 123/48 (!) 114/52 (!) 149/80  Pulse: 78 76 77 83  Resp: _0 Temp:   98 F (36.7 C)   TempSrc:   Oral   SpO2: 99% 98% 96%   Weight:      Height:       Body mass index is 28.11 kg/m.  Physical Exam General/constitutional: no distress, pleasant HEENT: Normocephalic, PER, Conj Clear, EOMI, Oropharynx clear Neck supple CV: rrr no mrg Lungs: clear to auscultation, normal respiratory effort Abd: Soft, Nontender -- wound vac functioning, left sided jp drain serosanguinous output Ext: no edema Skin: no surrounding erythema/tenderness at abd incision Neuro: nonfocal MSK: left knee a little warm and tender but relatively more active rom today     Central line presence: rue picc site no erythema/tenderness   Lab Results Lab Results  Component Value Date   WBC 15.3 (H) 03/08/2021   HGB 9.5 (L) 03/08/2021   HCT 28.0 (L) 03/08/2021   MCV 100.4 (H) 03/08/2021   PLT 425 (H) 03/08/2021    Lab Results  Component Value Date   CREATININE 0.96 03/08/2021   BUN 26 (H) 03/08/2021   NA 132 (L) 03/08/2021   K 4.6 03/08/2021   CL 102 03/08/2021   CO2 22 03/08/2021    Lab Results  Component Value Date   ALT 13 03/08/2021   AST 16 03/08/2021   ALKPHOS 104 03/08/2021   BILITOT 1.0  03/08/2021      Microbiology: Recent Results (from the past 240 hour(s))  Body fluid culture w Gram Stain     Status: None (Preliminary result)   Collection Time: 03/08/21  9:27 AM   Specimen: Synovium; Body Fluid  Result Value Ref Range Status   Specimen Description SYNOVIAL FLUID  Final   Special Requests LEFT KNEE  Final   Gram Stain   Final    WBC PRESENT,BOTH PMN AND MONONUCLEAR NO ORGANISMS SEEN CYTOSPIN SMEAR Performed at [facility], [address] [place], [place] [address]    Culture PENDING  Incomplete   Report Status PENDING  Incomplete     Serology:  Imaging: If present, new imagings (plain films, ct scans, and mri) have been personally visualized and interpreted; radiology reports have been reviewed. Decision making incorporated into the Impression / Recommendations.  9/16 abd pelv ct 1. Large heterogeneous left iliopsoas collection may represent abscess, hematoma, or postoperative fluid collection. Stranding in the retroperitoneum and left pararenal fat as well as subcutaneous tissues could be inflammatory or contusion. 2. Interval postoperative changes with left lower quadrant colostomy and left lower quadrant pelvic drainage catheter. The previous diverticular abscess has resolved. 3. Mild residual distention of the stomach and proximal small bowel. An enteric tube is present with tip just below the level of the EG junction. Advancement of 4-5 cm may be useful. 4. Small bilateral pleural effusions with basilar atelectasis. 5. Additional incidental findings as above.  Jabier Mutton, [facility] [place] [phone] pager    03/08/2021, 1:15 PM

## 2021-03-08 NOTE — Progress Notes (Signed)
Physical Therapy Treatment Patient Details Name: Travis Conrad MRN: [id number] DOB: January 12, 1951 Today's Date: 03/08/2021   History of Present Illness 70 yo admitted 9/3 with abdominal pain, vomiting and ileus. 9/4 Afib with RVR. 9/8 intrabdominal abscess s/p IR drain placed. 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 Pt found to have lt iliopsoas hematoma on CT 9/16.  PMhx: HTN, HLD, Afib, ETOH use    PT Comments    Pt tolerated treatment well with notable swelling in [name]. Pt required decreased assist (min guard) to roll compared to previous sessions with verbal cues provided to guide through task. Pt performed sidelying to sit, transfers, and gait similar to previous sessions. Planned to emphasize transfers from lower surface and increased quad strength during session; however, limited by swelling and pain in [name]. Continue to recommend CIR upon d/c given pt's functional status and tolerance for PT during sessions.   Vitals in session- BP 114/52 (62) HR 82 -100  Recommendations for follow up therapy are one component of a multi-disciplinary discharge planning process, led by the attending physician.  Recommendations may be updated based on patient status, additional functional criteria and insurance authorization.  Follow Up Recommendations  Supervision for mobility/OOB;CIR     Equipment Recommendations  None recommended by PT    Recommendations for Other Services       Precautions / Restrictions Precautions Precautions: Fall;Other (comment) Precaution Comments: wound vAC, jp drain, NGtube     Mobility  Bed Mobility Overal bed mobility: Needs Assistance Bed Mobility: Rolling;Sidelying to Sit Rolling: Min guard Sidelying to sit: Min assist;HOB elevated       General bed mobility comments: HOB 24 degrees with increased time mod cues and reliance on elevated HOB. Sats dropped to 88% with transition to sitting with cues for  breathing technique . Increased time between transitional    Transfers Overall transfer level: Needs assistance   Transfers: Sit to/from Stand Sit to Stand: Min assist         General transfer comment: min assist to stand from elevated bed. Pt with noticeable [name] edema from mid thigh through calf with increased pain and tenderness  Ambulation/Gait Ambulation/Gait assistance: Min guard Gait Distance (Feet): 135 Feet Assistive device: Rolling walker (2 wheeled) Gait Pattern/deviations: Trunk flexed;Decreased stride length;Step-through pattern   Gait velocity interpretation: 1.31 - 2.62 ft/sec, indicative of limited community ambulator General Gait Details: Assist for safety and lines. Verbal cues to stay closer to walker and extend trunk .Pt unable to extend trunk due to pain. Reliance on RW with [name] pain and edema   Stairs             Wheelchair Mobility    Modified Rankin (Stroke Patients Only)       Balance Overall balance assessment: Needs assistance   Sitting balance-Leahy Scale: Good Sitting balance - Comments: able to sit without UE support   Standing balance support: Bilateral upper extremity supported Standing balance-Leahy Scale: Poor Standing balance comment: walker and min guard for static standing                            Cognition                                              Exercises  General Comments General comments (skin integrity, edema, etc.): VSS on RA BP 114/52 (62) HR 82-100      Pertinent Vitals/Pain      Home Living                      Prior Function            PT Goals (current goals can now be found in the care plan section) Progress towards PT goals: Progressing toward goals    Frequency    Min 3X/week      PT Plan Current plan remains appropriate    Co-evaluation              AM-PAC PT "6 Clicks" Mobility   Outcome Measure  Help needed turning from  your back to your side while in a flat bed without using bedrails?: A Little Help needed moving from lying on your back to sitting on the side of a flat bed without using bedrails?: A Little Help needed moving to and from a bed to a chair (including a wheelchair)?: A Little Help needed standing up from a chair using your arms (e.g., wheelchair or bedside chair)?: A Little Help needed to walk in hospital room?: A Little Help needed climbing 3-5 steps with a railing? : A Lot 6 Click Score: 17    End of Session   Activity Tolerance: Patient tolerated treatment well Patient left: in chair;with call bell/phone within reach;with chair alarm set Nurse Communication: Mobility status PT Visit Diagnosis: Other abnormalities of gait and mobility (R26.89);Difficulty in walking, not elsewhere classified (R26.2)     Time: 9147-8295 PT Time Calculation (min) (ACUTE ONLY): 27 min  Charges:  $Gait Training: 8-22 mins $Therapeutic Activity: 8-22 mins                     Louie Casa, SPT Acute Rehab: [phone]    Domingo Dimes 03/08/2021, 10:13 AM

## 2021-03-08 NOTE — Progress Notes (Signed)
10 Days Post-Op  Subjective: CC: Patient reports left sided abdominal "tightness". No nausea. NGT bilious. No output from colostomy. Mobilizing with therapies. Voiding.   Objective: Vital signs in last 24 hours: Temp:  [97.7 F (36.5 C)-98.8 F (37.1 C)] 98 F (36.7 C) (09/20 0722) Pulse Rate:  [72-80] 77 (09/20 0722) Resp:  [16-21] 16 (09/20 0722) BP: (106-141)/(43-70) 114/52 (09/20 0722) SpO2:  [96 %-99 %] 96 % (09/20 0722) Last BM Date: 02/22/21  Intake/Output from previous day: 09/19 0701 - 09/20 0700 In: 979.4 [Blood:315; NG/GT:210; IV Piggyback:454.4] Out: 2085 [Urine:1600; Emesis/NG output:200; Drains:285] Intake/Output this shift: No intake/output data recorded.  PE: Gen:  Alert, NAD, pleasant Pulm:  Normal rate and effort. CTAB Abd: Soft, mild distension, mild diffuse TTP without rebound or guarding greatest on left abdomen near ostomy and drain.  Drain w/ SS output. Colostomy is pink, appears healthy, small amount of serous drainage. No air or stool in colostomy bag. NGT w/ bilious output. Vac to midline incision with good seal Skin: no rashes noted, warm and dry    Lab Results:  Recent Labs    03/07/21 0412 03/07/21 1730 03/08/21 0026  WBC 13.8*  --  12.7*  HGB 6.7* 8.1* 8.1*  HCT 19.8* 23.7* 24.0*  PLT 351  --  361   BMET Recent Labs    03/06/21 0732 03/07/21 0412  NA 130* 130*  K 4.8 4.6  CL 102 100  CO2 21* 22  GLUCOSE 145* 137*  BUN 28* 27*  CREATININE 1.01 1.05  CALCIUM 8.0* 8.0*   PT/INR No results for input(s): LABPROT, INR in the last 72 hours. CMP     Component Value Date/Time   NA 130 (L) 03/07/2021 0412   NA 134 09/30/2019 0959   NA 142 03/03/2016 0921   K 4.6 03/07/2021 0412   K 4.3 03/03/2016 0921   CL 100 03/07/2021 0412   CO2 22 03/07/2021 0412   CO2 25 03/03/2016 0921   GLUCOSE 137 (H) 03/07/2021 0412   GLUCOSE 89 03/03/2016 0921   BUN 27 (H) 03/07/2021 0412   BUN 11 09/30/2019 0959   BUN 13.6 03/03/2016 0921    CREATININE 1.05 03/07/2021 0412   CREATININE 1.2 03/03/2016 0921   CALCIUM 8.0 (L) 03/07/2021 0412   CALCIUM 9.6 03/03/2016 0921   PROT 4.7 (L) 03/07/2021 0412   PROT 7.3 03/03/2016 0921   ALBUMIN 1.3 (L) 03/07/2021 0412   ALBUMIN 3.8 03/03/2016 0921   AST 13 (L) 03/07/2021 0412   AST 36 (H) 03/03/2016 0921   ALT 13 03/07/2021 0412   ALT 31 03/03/2016 0921   ALKPHOS 78 03/07/2021 0412   ALKPHOS 116 03/03/2016 0921   BILITOT 0.6 03/07/2021 0412   BILITOT 0.71 03/03/2016 0921   GFRNONAA >60 03/07/2021 0412   GFRNONAA 67 07/25/2013 1712   GFRAA >60 10/03/2019 0930   GFRAA 77 07/25/2013 1712   Lipase     Component Value Date/Time   LIPASE 22 02/19/2021 1653    Studies/Results: DG Knee Complete 4 Views Left  Result Date: 03/07/2021 CLINICAL DATA:  70 year old male with history of left lateral knee pain. EXAM: LEFT KNEE - COMPLETE 4+ VIEW COMPARISON:  No priors. FINDINGS: No evidence of fracture, dislocation, or joint effusion. Joint space narrowing, subchondral sclerosis, subchondral cyst formation and osteophyte formation is noted, most evident in the patellofemoral compartment. Numerous vascular calcifications are noted. Soft tissues are otherwise unremarkable. IMPRESSION: 1. No acute radiographic abnormality of the left knee. 2. 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 Electronically Signed   By: Vinnie Langton M.D.   On: 03/07/2021 19:04    Anti-infectives: Anti-infectives (From admission, onward)    Start     Dose/Rate Route Frequency Ordered Stop   02/26/21 0100  doxycycline (VIBRAMYCIN) 100 mg in sodium chloride 0.9 % 250 mL IVPB        100 mg 125 mL/hr over 120 Minutes Intravenous 2 times daily 02/26/21 0005 03/08/21 2359   02/20/21 0300  piperacillin-tazobactam (ZOSYN) IVPB 3.375 g        3.375 g 12.5 mL/hr over 240 Minutes Intravenous Every 8 hours 02/19/21 1907 03/08/21 2359   02/19/21 1900  ceFEPIme (MAXIPIME) 2 g in sodium chloride 0.9 % 100  mL IVPB  Status:  Discontinued        2 g 200 mL/hr over 30 Minutes Intravenous  Once 02/19/21 1857 02/19/21 1906   02/19/21 1900  metroNIDAZOLE (FLAGYL) IVPB 500 mg  Status:  Discontinued        500 mg 100 mL/hr over 60 Minutes Intravenous  Once 02/19/21 1857 02/19/21 1906   02/19/21 1815  piperacillin-tazobactam (ZOSYN) IVPB 3.375 g        3.375 g 100 mL/hr over 30 Minutes Intravenous  Once 02/19/21 1809 02/19/21 1944        Assessment/Plan POD 10 s/p ex lap, sigmoid colectomy, end colostomy with enterorrhaphy and drainage of intraabdominal abscess - Dr. Kieth Brightly and Dr. Radene Knee 9/10 for Acute sigmoid diverticulitis with contained perforation and abscess - CT 9/16 demonstrates heterogenous left iliopsoas collection likely hematoma, stranding in the retroperitoneum and subcu tissue.  Distal colon appears decompressed and very small caliber but there is contrast entering this area; there is additionally a similarly small-caliber segment of colon in the proximal transverse and the ascending colon is not dilated- suspect over all this represents small bowel/gastric ileus.  IR does not recommend draining the hematoma at this point.   - Still with ileus. Digitized stoma on 9/19 and without evidence of stricture. Continue NG. Was started on reglan over the weekend. I discussed with him the risk of tardive dyskinesia/EPS. Patient stated understanding and is okay with continuing Reglan.  - Cont drain  - Wound vac MWF - WOCN following for new ostomy teaching and vac changes - Await bowel function and keep NGT to LIWS today - Cont TPN - Encouraged ambulation (working with therapies), use IS, multimodal pain control  - PT rec CIR - Cont abx per ID recs   FEN - NGT LIWS, NPO sips/chips, IVF, TPN VTE - per primary with recs as noted below.  ID - Zosyn 9/4 - 10/17 (per ID). WBC down. Afebrile. Foley - out, voiding.    Per primary: A fib - cards following  AKI - resolved ABL anemia -  7.7 > 6.7 >  1U PRBC > 8.9 > 8.9. Okay to restart Heparin gtt without bolus but will defer to primary  HTN ETOH use L knee pain - per ortho    LOS: 16 days    Jillyn Ledger , [facility] Surgery 03/08/2021, 8:24 AM Please see Amion for pager number during day hours 7:00am-4:30pm

## 2021-03-08 NOTE — Procedures (Addendum)
Procedure: Left knee aspiration and injection   Indication: Left knee effusion(s)   Surgeon: Silvestre Gunner, PA-C   Assist: None   Anesthesia: Topical refrigerant   EBL: None   Complications: None   Findings: After risks/benefits explained patient desires to undergo procedure. Consent obtained and time out performed. The left knee was sterilely prepped and aspirated. 79ml pale yellow clear fluid obtained. 40mg  depomedrol and 48ml 0.5% Marcaine instilled. Pt tolerated the procedure well.       Lisette Abu, PA-C Orthopedic Surgery [phone]  Cell count of the aspirate shows 610 nucleated cells and no crystals.  Gram stain with no organisms.  This likely represents a reactive effusion that should be effectively treated with the steroid injection.  He is safe to bear weight as tolerated.  We'll follow cultures, but there is no surgical indication at this time.

## 2021-03-09 DIAGNOSIS — I10 Essential (primary) hypertension: Secondary | ICD-10-CM | POA: Diagnosis not present

## 2021-03-09 DIAGNOSIS — N451 Epididymitis: Secondary | ICD-10-CM | POA: Diagnosis not present

## 2021-03-09 DIAGNOSIS — K5720 Diverticulitis of large intestine with perforation and abscess without bleeding: Secondary | ICD-10-CM | POA: Diagnosis not present

## 2021-03-09 DIAGNOSIS — K5792 Diverticulitis of intestine, part unspecified, without perforation or abscess without bleeding: Secondary | ICD-10-CM | POA: Diagnosis not present

## 2021-03-09 DIAGNOSIS — K651 Peritoneal abscess: Secondary | ICD-10-CM | POA: Diagnosis not present

## 2021-03-09 DIAGNOSIS — I482 Chronic atrial fibrillation, unspecified: Secondary | ICD-10-CM | POA: Diagnosis not present

## 2021-03-09 LAB — BASIC METABOLIC PANEL
Anion gap: 6 (ref 5–15)
BUN: 24 mg/dL — ABNORMAL HIGH (ref 8–23)
CO2: 23 mmol/L (ref 22–32)
Calcium: 8.1 mg/dL — ABNORMAL LOW (ref 8.9–10.3)
Chloride: 103 mmol/L (ref 98–111)
Creatinine, Ser: 0.88 mg/dL (ref 0.61–1.24)
GFR, Estimated: >60 mL/min (ref >60)
Glucose, Bld: 182 mg/dL — ABNORMAL HIGH (ref 70–99)
Potassium: 5.2 mmol/L — ABNORMAL HIGH (ref 3.5–5.1)
Sodium: 132 mmol/L — ABNORMAL LOW (ref 135–145)

## 2021-03-09 LAB — MAGNESIUM: Magnesium: 2.1 mg/dL (ref 1.7–2.4)

## 2021-03-09 LAB — CBC
HCT: 23.6 % — ABNORMAL LOW (ref 39.0–52.0)
Hemoglobin: 8 g/dL — ABNORMAL LOW (ref 13.0–17.0)
MCH: 34 pg (ref 26.0–34.0)
MCHC: 33.9 g/dL (ref 30.0–36.0)
MCV: 100.4 fL — ABNORMAL HIGH (ref 80.0–100.0)
Platelets: 347 10*3/uL (ref 150–400)
RBC: 2.35 MIL/uL — ABNORMAL LOW (ref 4.22–5.81)
RDW: 14.6 % (ref 11.5–15.5)
WBC: 13.5 10*3/uL — ABNORMAL HIGH (ref 4.0–10.5)
nRBC: 0 % (ref 0.0–0.2)

## 2021-03-09 LAB — PHOSPHORUS: Phosphorus: 3.9 mg/dL (ref 2.5–4.6)

## 2021-03-09 LAB — GLUCOSE, CAPILLARY
Glucose-Capillary: 126 mg/dL — ABNORMAL HIGH (ref 70–99)
Glucose-Capillary: 134 mg/dL — ABNORMAL HIGH (ref 70–99)
Glucose-Capillary: 137 mg/dL — ABNORMAL HIGH (ref 70–99)
Glucose-Capillary: 138 mg/dL — ABNORMAL HIGH (ref 70–99)
Glucose-Capillary: 157 mg/dL — ABNORMAL HIGH (ref 70–99)
Glucose-Capillary: 162 mg/dL — ABNORMAL HIGH (ref 70–99)

## 2021-03-09 MED ORDER — PIPERACILLIN-TAZOBACTAM 3.375 G IVPB
3.3750 g | Freq: Three times a day (TID) | INTRAVENOUS | Status: AC
  Administered 2021-03-09 – 2021-03-25 (×50): 3.375 g via INTRAVENOUS
  Filled 2021-03-09 (×49): qty 50

## 2021-03-09 MED ORDER — MAGNESIUM FOR TPN
INJECTION | INTRAVENOUS | Status: AC
  Filled 2021-03-09: qty 1020

## 2021-03-09 MED ORDER — BISACODYL 10 MG RE SUPP
10.0000 mg | Freq: Once | RECTAL | Status: AC
  Administered 2021-03-09: 10 mg via RECTAL
  Filled 2021-03-09: qty 1

## 2021-03-09 NOTE — Progress Notes (Addendum)
PROGRESS NOTE    HUNTLEY KNOOP  ZHY:[id number] DOB: 1950/07/31 DOA: 02/19/2021 PCP: [name], MD    Brief Narrative:  [name] was admitted to the hospital with the working diagnosis of severe sepsis due to perforated diverticulitis. Now sp lapartomy, with colectomy.   70 year old male past medical history for atrial fibrillation, dyslipidemia, hypertension and diverticulosis who presented with nausea, vomiting and abdominal pain.  Reported several days of worse abdominal pain, and poor oral intake.  Because of severe symptoms he presented to the hospital.  On his initial physical examination his blood pressure was 84/65, heart rate 57, respiratory rate 20, oxygen saturation 91%, he was ill-looking appearing, heart S1-S2, present, irregular irregular, lungs clear to auscultation, abdomen tender to palpation in the mid abdomen, no lower extremity edema.  CT of the abdomen and pelvis with acute sigmoid diverticulitis with small extraluminal gas and fluid collection in the sigmoid mesocolon, 3.5 x 1.5 x 2.7 cm consistent with perforation and abscess.  Dilated proximal and decompressed distal small bowel loops, possible ileus.  Patient underwent laparotomy with sigmoid colectomy and colostomy placement.  Postoperative ileus.  9/4 developed atrial fibrillation with rapid ventricular response, required intravenous amiodarone infusion. Follow-up CT 9/7 with increased size pelvic abscess. On 9/8 patient underwent CT-guided drainage of pelvic abscess.  Urology and ID have been consulted for epididymitis.  9/19 Postoperative anemia: Required 1 unit packed red blood cells.   Positive left knee pain, positive effusion, underwent left knee aspiration and injection with good toleration.  Fluid with no crystals, gram stain no organisms.  Assessment & Plan:   Principal Problem:   Diverticulitis of large intestine with perforation and abscess without bleeding Active Problems:   Diverticulitis of  intestine with abscess   Atrial fibrillation, chronic (HCC)   Essential hypertension   Diverticulitis   AKI (acute kidney injury) ([place])   Severe sepsis with acute organ dysfunction (HCC)   Intra-abdominal abscess (HCC)   Epididymitis   Severe sepsis due to perforated diverticulitis, sp colectomy. Left psoas collection. Post operative ileus.  Patient with no nausea or vomiting, NG tube in place On TPN for nutrition, continue to be NPO per surgery recommendations.  Wbc is 13,5 from 15,3.   Plan to continue antibiotic therapy with IV zosyn.  Continue to encourage mobility.  Follow with surgery recommendations.   2. Post operative anemia. Hgb and hct are stable, patient is sp 1 unit PRBC transfusion on 9.19.  3. Paroxysmal atrial fibrillation/ HTN. Patient continue now on sinus rhythm. Rate control with amiodarone and metoprolol, holding on anticoagulation due to risk of bleeding.   Continue blood pressure control with amlodipine and as needed hydralazine.   4. Right epididymitis Clinically improving, completed antibiotic therapy with doxycycline.   5. AKI with hypokalemia/ hyperkalemia,hyponatremia and hypophosphatemia. Renal function stable and electrolytes have been corrected.  K is 5,2 and Na 132, with serum bicarbonate at 23 and renal function with serum cr at 0,88. Further adjustments of electrolytes per TPN/   6. Alcohol withdrawal. No clinical signs of withdrawal today, continue neuro checks per unit protocol.   7. Left knee arthritis. Clinically improved after steroid injection.   Patient continue to be at high risk for worsening ileus.   Status is: Inpatient  Remains inpatient appropriate because:Inpatient level of care appropriate due to severity of illness  Dispo: The patient is from: Home              Anticipated d/c is to: SNF  Patient currently is not medically stable to d/c.   Difficult to place patient No  DVT prophylaxis: Enoxaparin   Code  Status:    full  Family Communication:  I spoke with patient's wife at the bedside, we talked in detail about patient's condition, plan of care and prognosis and all questions were addressed.      Nutrition Status: Nutrition Problem: Inadequate oral intake Etiology: acute illness (diverticulitis) Signs/Symptoms: per patient/family report, NPO status Interventions: TPN     Skin Documentation:     Consultants:  Surgery  ID Urology   Procedures:  Colectomy and ostomy   Antimicrobials:  zosyn    Subjective: Patient with no nausea or vomiting, NG tube continue to be in place and he continue to be NPO. Left knee pain has improved.   Objective: Vitals:   03/09/21 0400 03/09/21 0746 03/09/21 0800 03/09/21 1218  BP: (!) 120/53  (!) 125/56 (!) 118/52  Pulse: 71  70   Resp: 15  15   Temp: 98.1 F (36.7 C) 97.8 F (36.6 C)    TempSrc: Oral Oral    SpO2: 96%  96%   Weight:      Height:        Intake/Output Summary (Last 24 hours) at 03/09/2021 1452 Last data filed at 03/09/2021 0900 Gross per 24 hour  Intake 3237.95 ml  Output 1870 ml  Net 1367.95 ml   Filed Weights   02/26/21 0913 03/06/21 0348 03/07/21 0342  Weight: 82.6 kg 85.6 kg 94 kg    Examination:   General: Not in pain or dyspnea , deconditioned Neurology: Awake and alert, non focal .  E ENT: no pallor, no icterus, oral mucosa moist. NG tube in place.  Cardiovascular: No JVD. S1-S2 present, rhythmic, no gallops, rubs, or murmurs. No lower extremity edema. Pulmonary: vesicular breath sounds bilaterally, adequate air movement, no wheezing, rhonchi or rales. Gastrointestinal. Abdomen mild distended, ostomy and surgical wound in place. Non tender to superficial palpation Skin. No rashes Musculoskeletal: no joint deformities     Data Reviewed: I have personally reviewed following labs and imaging studies  CBC: Recent Labs  Lab 03/06/21 0131 03/07/21 0412 03/07/21 1730 03/08/21 0026 03/08/21 0808  03/09/21 0011  WBC 19.2* 13.8*  --  12.7* 15.3* 13.5*  HGB 7.7* 6.7* 8.1* 8.1* 9.5* 8.0*  HCT 22.1* 19.8* 23.7* 24.0* 28.0* 23.6*  MCV 100.9* 102.6*  --  100.4* 100.4* 100.4*  PLT 368 351  --  361 425* 373   Basic Metabolic Panel: Recent Labs  Lab 03/03/21 0546 03/04/21 1120 03/05/21 0129 03/06/21 0732 03/07/21 0412 03/08/21 0808 03/09/21 0011  NA 132*   < > 128* 130* 130* 132* 132*  K 4.7   < > 4.7 4.8 4.6 4.6 5.2*  CL 101   < > 97* 102 100 102 103  CO2 24   < > 21* 21* 22 22 23   GLUCOSE 178*   < > 166* 145* 137* 137* 182*  BUN 16   < > 17 28* 27* 26* 24*  CREATININE 0.83   < > 0.89 1.01 1.05 0.96 0.88  CALCIUM 8.1*   < > 8.4* 8.0* 8.0* 8.5* 8.1*  MG 2.1  --  2.1  --  2.1  --  2.1  PHOS 3.6  --  3.7  --  4.3  --  3.9   < > = values in this interval not displayed.   GFR: Estimated Creatinine Clearance: 93 mL/min (by C-G formula based on  SCr of 0.88 mg/dL). Liver Function Tests: Recent Labs  Lab 03/03/21 0546 03/04/21 1120 03/05/21 0129 03/07/21 0412 03/08/21 0808  AST 15 12* 12* 13* 16  ALT 11 9 11 13 13   ALKPHOS 53 60 68 78 104  BILITOT 0.3 0.5 0.5 0.6 1.0  PROT 4.7* 4.8* 5.1* 4.7* 5.9*  ALBUMIN 1.6* 1.6* 1.6* 1.3* 1.7*   No results for input(s): LIPASE, AMYLASE in the last 168 hours. No results for input(s): AMMONIA in the last 168 hours. Coagulation Profile: No results for input(s): INR, PROTIME in the last 168 hours. Cardiac Enzymes: No results for input(s): CKTOTAL, CKMB, CKMBINDEX, TROPONINI in the last 168 hours. BNP (last 3 results) No results for input(s): PROBNP in the last 8760 hours. HbA1C: No results for input(s): HGBA1C in the last 72 hours. CBG: Recent Labs  Lab 03/08/21 1922 03/08/21 2337 03/09/21 0355 03/09/21 0748 03/09/21 1132  GLUCAP 162* 167* 157* 162* 138*   Lipid Profile: Recent Labs    03/07/21 0412  TRIG 83   Thyroid Function Tests: No results for input(s): TSH, T4TOTAL, FREET4, T3FREE, THYROIDAB in the last 72  hours. Anemia Panel: No results for input(s): VITAMINB12, FOLATE, FERRITIN, TIBC, IRON, RETICCTPCT in the last 72 hours.    Radiology Studies: I have reviewed all of the imaging during this hospital visit personally     Scheduled Meds:  amiodarone  200 mg Per NG tube BID   amLODipine  10 mg Per Tube Daily   Chlorhexidine Gluconate Cloth  6 each Topical Daily   insulin aspart  0-15 Units Subcutaneous Q4H   lidocaine  1 patch Transdermal Q24H   melatonin  3 mg Per Tube QHS   metoCLOPramide (REGLAN) injection  10 mg Intravenous Q6H   metoprolol tartrate  100 mg Per Tube BID   nicotine  21 mg Transdermal Daily   pantoprazole (PROTONIX) IV  40 mg Intravenous Q24H   sodium chloride flush  10 mL Intracatheter Q12H   Continuous Infusions:  sodium chloride Stopped (03/05/21 0320)   methocarbamol (ROBAXIN) IV 1,000 mg (03/09/21 1108)   piperacillin-tazobactam (ZOSYN)  IV 3.375 g (03/09/21 0851)   TPN ADULT (ION) 85 mL/hr at 03/08/21 1714   TPN ADULT (ION)       LOS: 17 days        Alter Moss Gerome Apley, MD

## 2021-03-09 NOTE — Consult Note (Signed)
[place] Nurse wound follow up Patient receiving care in [facility] 2W01 Alferd Apa, PA-C present for dressing change.  Wound type: Surgical midline incision Wound bed: Clean, beefy red granulation tissue Drainage (amount, consistency, odor) Bloody drainage in canister Periwound: intact Dressing procedure/placement/frequency: One piece of black foam dressing removed. One piece of black foam replaced. Drape applied, immediate seal obtained at 125 mmHg. WOC will continue to follow M/W/F  [place] Nurse ostomy follow up Patient still not putting out any stool. MD aware.  Stoma type/location: LUQ colostomy Stomal assessment/size: 1 3/8" red budded just above the level of the skin. Sutures in place.  Peristomal assessment: intact Treatment options for stomal/peristomal skin: Barrier ring Output: scant thin clear/blood Ostomy pouching: 1pc. convex Education provided: Wife was able to go through the entire process of changing the pouch. She was able to open and close, cut to size. She will complete the entire process on Wednesday with my direction.  Enrolled patient in [place] Start Discharge program: Yes (previously)  Cathlean Marseilles. Tamala Julian, MSN, RN, [place], [name], [facility] Wound Treatment Associate Pager [phone]

## 2021-03-09 NOTE — Progress Notes (Addendum)
11 Days Post-Op  Subjective: CC: Stable left sided tightness/abdominal pain. No nausea. NGT bilious. No output from colostomy. Undergoing vac and ostomy change.   Objective: Vital signs in last 24 hours: Temp:  [97.8 F (36.6 C)-98.5 F (36.9 C)] 97.8 F (36.6 C) (09/21 0746) Pulse Rate:  [71-84] 71 (09/21 0400) Resp:  [15-20] 15 (09/21 0400) BP: (118-149)/(48-80) 120/53 (09/21 0400) SpO2:  [96 %-98 %] 96 % (09/21 0400) Last BM Date: 02/22/21  Intake/Output from previous day: 09/20 0701 - 09/21 0700 In: 2988 [I.V.:1912.1; IV Piggyback:1075.9] Out: 1270 [Emesis/NG output:1200; Drains:70] Intake/Output this shift: Total I/O In: 250 [IV Piggyback:250] Out: -   PE: Gen:  Alert, NAD, pleasant Pulm:  Normal rate and effort. Abd: Soft, mild distension, mild diffuse TTP without rebound or guarding greatest on left abdomen near ostomy and drain.  Drain w/ SS output. Stoma is pink, appears healthy and there is no evidence of stricture when digitized. No air or stool in colostomy bag. NGT w/ bilious output. Vac to midline incision with good seal. Seen during vac change - wound with healthy granulation tissue at the base.  Skin: no rashes noted, warm and dry      Lab Results:  Recent Labs    03/08/21 0808 03/09/21 0011  WBC 15.3* 13.5*  HGB 9.5* 8.0*  HCT 28.0* 23.6*  PLT 425* 347   BMET Recent Labs    03/08/21 0808 03/09/21 0011  NA 132* 132*  K 4.6 5.2*  CL 102 103  CO2 22 23  GLUCOSE 137* 182*  BUN 26* 24*  CREATININE 0.96 0.88  CALCIUM 8.5* 8.1*   PT/INR No results for input(s): LABPROT, INR in the last 72 hours. CMP     Component Value Date/Time   NA 132 (L) 03/09/2021 0011   NA 134 09/30/2019 0959   NA 142 03/03/2016 0921   K 5.2 (H) 03/09/2021 0011   K 4.3 03/03/2016 0921   CL 103 03/09/2021 0011   CO2 23 03/09/2021 0011   CO2 25 03/03/2016 0921   GLUCOSE 182 (H) 03/09/2021 0011   GLUCOSE 89 03/03/2016 0921   BUN 24 (H) 03/09/2021 0011    BUN 11 09/30/2019 0959   BUN 13.6 03/03/2016 0921   CREATININE 0.88 03/09/2021 0011   CREATININE 1.2 03/03/2016 0921   CALCIUM 8.1 (L) 03/09/2021 0011   CALCIUM 9.6 03/03/2016 0921   PROT 5.9 (L) 03/08/2021 0808   PROT 7.3 03/03/2016 0921   ALBUMIN 1.7 (L) 03/08/2021 0808   ALBUMIN 3.8 03/03/2016 0921   AST 16 03/08/2021 0808   AST 36 (H) 03/03/2016 0921   ALT 13 03/08/2021 0808   ALT 31 03/03/2016 0921   ALKPHOS 104 03/08/2021 0808   ALKPHOS 116 03/03/2016 0921   BILITOT 1.0 03/08/2021 0808   BILITOT 0.71 03/03/2016 0921   GFRNONAA >60 03/09/2021 0011   GFRNONAA 67 07/25/2013 1712   GFRAA >60 10/03/2019 0930   GFRAA 77 07/25/2013 1712   Lipase     Component Value Date/Time   LIPASE 22 02/19/2021 1653    Studies/Results: DG Knee Complete 4 Views Left  Result Date: 03/07/2021 CLINICAL DATA:  70 year old male with history of left lateral knee pain. EXAM: LEFT KNEE - COMPLETE 4+ VIEW COMPARISON:  No priors. FINDINGS: No evidence of fracture, dislocation, or joint effusion. Joint space narrowing, subchondral sclerosis, subchondral cyst formation and osteophyte formation is noted, most evident in the patellofemoral compartment. Numerous vascular calcifications are noted. Soft tissues are otherwise unremarkable.  IMPRESSION: 1. No acute radiographic abnormality of the left knee. 2. 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 Electronically Signed   By: Vinnie Langton M.D.   On: 03/07/2021 19:04    Anti-infectives: Anti-infectives (From admission, onward)    Start     Dose/Rate Route Frequency Ordered Stop   03/09/21 0800  piperacillin-tazobactam (ZOSYN) IVPB 3.375 g        3.375 g 12.5 mL/hr over 240 Minutes Intravenous Every 8 hours 03/09/21 0706 04/04/21 0759   02/26/21 0100  doxycycline (VIBRAMYCIN) 100 mg in sodium chloride 0.9 % 250 mL IVPB        100 mg 125 mL/hr over 120 Minutes Intravenous 2 times daily 02/26/21 0005 03/08/21 2359   02/20/21 0300   piperacillin-tazobactam (ZOSYN) IVPB 3.375 g        3.375 g 12.5 mL/hr over 240 Minutes Intravenous Every 8 hours 02/19/21 1907 03/08/21 2359   02/19/21 1900  ceFEPIme (MAXIPIME) 2 g in sodium chloride 0.9 % 100 mL IVPB  Status:  Discontinued        2 g 200 mL/hr over 30 Minutes Intravenous  Once 02/19/21 1857 02/19/21 1906   02/19/21 1900  metroNIDAZOLE (FLAGYL) IVPB 500 mg  Status:  Discontinued        500 mg 100 mL/hr over 60 Minutes Intravenous  Once 02/19/21 1857 02/19/21 1906   02/19/21 1815  piperacillin-tazobactam (ZOSYN) IVPB 3.375 g        3.375 g 100 mL/hr over 30 Minutes Intravenous  Once 02/19/21 1809 02/19/21 1944        Assessment/Plan POD 11 s/p ex lap, sigmoid colectomy, end colostomy with enterorrhaphy and drainage of intraabdominal abscess - Dr. Kieth Brightly and Dr. Radene Knee 9/10 for Acute sigmoid diverticulitis with contained perforation and abscess - CT 9/16 demonstrates heterogenous left iliopsoas collection likely hematoma, stranding in the retroperitoneum and subcu tissue.  Distal colon appears decompressed and very small caliber but there is contrast entering this area; there is additionally a similarly small-caliber segment of colon in the proximal transverse and the ascending colon is not dilated- suspect over all this represents small bowel/gastric ileus.  IR does not recommend draining the hematoma at this point. Consider repeat imaging if develops worsening pain, leukocytosis or fever - Still with ileus. Digitized stoma and without evidence of stricture. Continue NG. Was started on reglan over the weekend. I discussed with him the risk of tardive dyskinesia/EPS on 9/20. Patient stated understanding and is okay with continuing Reglan.  - Cont drain  - Wound vac MWF - WOCN following for new ostomy teaching and vac changes - Await bowel function and keep NGT to LIWS today - Cont TPN - Encouraged ambulation (working with therapies), use IS, multimodal pain control  - PT  rec CIR - Cont abx per ID recs - Discussed with TRH   FEN - NGT LIWS, NPO sips/chips, IVF, TPN. Ostomy Suppository  VTE - per primary with recs as noted below.  ID - Zosyn 9/4 - 10/17 (per ID). WBC down/stable (13.8>12.7>15.3>13.5). Afebrile. Foley - out, voiding.    Per primary: Hyperkalemia = K 5.2, per trh A fib - cards following  AKI - resolved ABL anemia -  7.7 > 6.7 > 1U PRBC > 8.1 > 8.1 > 9.5 >  8.0. Would recommend holding heparin until hgb stable HTN ETOH use L knee pain - per ortho    LOS: 17 days    Jillyn Ledger , [facility] Surgery 03/09/2021, 8:41 AM  Please see Amion for pager number during day hours 7:00am-4:30pm   I personally saw the patient and performed a substantive portion of this encounter, including a complete performance of at least one of the key components (MDM, Hx and/or Exam), in conjunction with the Advanced Practice Provider Alferd Apa, PA-C  Coralie Keens MD.

## 2021-03-09 NOTE — Progress Notes (Signed)
ANTIBIOTIC CONSULT NOTE - Follow Up Consult  Pharmacy Consult for  Zosyn Indication: intra-abdominal infection  Allergies  Allergen Reactions   Atorvastatin Other (See Comments)    myalgias    Patient Measurements: Height: 6' (182.9 cm) Weight: 94 kg (207 lb 3.7 oz) IBW/kg (Calculated) : 77.6 Heparin Dosing Weight: 82.6 kg  Vital Signs: Temp: 97.8 F (36.6 C) (09/21 0746) Temp Source: Oral (09/21 0746) BP: 118/52 (09/21 1218) Pulse Rate: 70 (09/21 0800)  Labs: Recent Labs    03/07/21 0412 03/07/21 1730 03/08/21 0026 03/08/21 0808 03/09/21 0011  HGB 6.7*   < > 8.1* 9.5* 8.0*  HCT 19.8*   < > 24.0* 28.0* 23.6*  PLT 351  --  361 425* 347  CREATININE 1.05  --   --  0.96 0.88   < > = values in this interval not displayed.     Estimated Creatinine Clearance: 93 mL/min (by C-G formula based on SCr of 0.88 mg/dL).  Assessment: 70 y/o male admitted for sepsis from contained perforated diverticulitis with active ileus. Pharmacy consulted for zosyn dosing.  From CT 9/16, previous diverticular abscess resolved. May suggest new finding for iliopsoas abscess. Culture from joint aspirate of the knee is also pending. Patient is on doxycycline for epididymitis coverage. ID recommended 4 weeks of antibiotics, ending on 10/17. Mr. Dumond has OPAT orders entered. Renal function has been stable this admission and no dose antibiotic adjustments have been made. Pharmacy will sign off.  Plan: . Continue Zosyn 3.375 gm IV q8h (each over 4 hours).  Thank you for allowing pharmacy to participate in this patient's care.  Reatha Harps, PharmD PGY1 Pharmacy Resident 03/09/2021 1:09 PM Check [email or web address] for unit specific pharmacy number

## 2021-03-09 NOTE — Progress Notes (Signed)
Occupational Therapy Treatment Patient Details Name: Travis Conrad MRN: [id number] DOB: 1951/05/28 Today's Date: 03/09/2021   History of present illness 70 yo admitted 9/3 with abdominal pain, vomiting and ileus. 9/4 Afib with RVR. 9/8 intrabdominal abscess s/p IR drain placed. 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 Pt found to have lt iliopsoas hematoma on CT 9/16.  PMhx: HTN, HLD, Afib, ETOH use   OT comments  Patient seen by skilled OT to address bed mobility, grooming seated on eob, and standing.  Patient was able to get to eob with min assist and demonstrated good recall of techniques.  Patient was able to perform grooming tasks seated on eob with supervision for safety.  Patient was able to side step along bed to get closer to [facility] before returning to supine. Acute OT to continue to follow.    Recommendations for follow up therapy are one component of a multi-disciplinary discharge planning process, led by the attending physician.  Recommendations may be updated based on patient status, additional functional criteria and insurance authorization.    Follow Up Recommendations  Home health OT    Equipment Recommendations  3 in 1 bedside commode    Recommendations for Other Services      Precautions / Restrictions Precautions Precautions: Fall;Other (comment) Precaution Comments: wound vAC, jp drain, NGtube       Mobility Bed Mobility Overal bed mobility: Needs Assistance Bed Mobility: Rolling;Sidelying to Sit Rolling: Min guard Sidelying to sit: Min assist;HOB elevated   Sit to supine: Mod assist   General bed mobility comments: HOB up and assistance with BLEs    Transfers Overall transfer level: Needs assistance Equipment used: Rolling walker (2 wheeled)   Sit to Stand: Min assist         General transfer comment: side stepped along bed to get closer to [place] Overall balance assessment: Needs  assistance Sitting-balance support: No upper extremity supported Sitting balance-Leahy Scale: Good Sitting balance - Comments: able to sit without UE support   Standing balance support: Bilateral upper extremity supported Standing balance-Leahy Scale: Poor Standing balance comment: stood with rw and min guard for balance                           ADL either performed or assessed with clinical judgement   ADL Overall ADL's : Needs assistance/impaired     Grooming: Wash/dry hands;Wash/dry face;Oral care;Brushing hair;Set up;Supervision/safety;Sitting Grooming Details (indicate cue type and reason): performed while sitting on eob Upper Body Bathing: Set up;Supervision/ safety;Sitting Upper Body Bathing Details (indicate cue type and reason): performed sitting on eob                           General ADL Comments: Patient stated he felt better after performing grooming tasks     Vision       Perception     Praxis      Cognition Arousal/Alertness: Awake/alert Behavior During Therapy: WFL for tasks assessed/performed Overall Cognitive Status: Impaired/Different from baseline Area of Impairment: Memory                     Memory: Decreased short-term memory         General Comments: HOH        Exercises     Shoulder Instructions       General Comments  Pertinent Vitals/ Pain       Pain Assessment: Faces Faces Pain Scale: Hurts little more Pain Location: abdomen Pain Descriptors / Indicators: Aching;Guarding Pain Intervention(s): Patient requesting pain meds-RN notified  Home Living                                          Prior Functioning/Environment              Frequency  Min 2X/week        Progress Toward Goals  OT Goals(current goals can now be found in the care plan section)  Progress towards OT goals: Progressing toward goals  Acute Rehab OT Goals Patient Stated Goal: return home  and be able to walk OT Goal Formulation: With patient Time For Goal Achievement: 03/13/21 Potential to Achieve Goals: Good  Plan Discharge plan remains appropriate    Co-evaluation                 AM-PAC OT "6 Clicks" Daily Activity     Outcome Measure   Help from another person eating meals?: Total Help from another person taking care of personal grooming?: A Little Help from another person toileting, which includes using toliet, bedpan, or urinal?: A Lot Help from another person bathing (including washing, rinsing, drying)?: A Lot Help from another person to put on and taking off regular upper body clothing?: A Little Help from another person to put on and taking off regular lower body clothing?: Total 6 Click Score: 12    End of Session Equipment Utilized During Treatment: Rolling walker  OT Visit Diagnosis: Muscle weakness (generalized) (M62.81);Pain   Activity Tolerance Patient tolerated treatment well   Patient Left in bed;with call bell/phone within reach;with bed alarm set;with nursing/sitter in room;with family/visitor present   Nurse Communication Patient requests pain meds        Time: 9794-8016 OT Time Calculation (min): 27 min  Charges: OT General Charges $OT Visit: 1 Visit OT Treatments $Self Care/Home Management : 23-37 mins  Lodema Hong, [employer] 03/09/2021, 3:33 PM

## 2021-03-09 NOTE — Progress Notes (Signed)
PHARMACY - TOTAL PARENTERAL NUTRITION CONSULT NOTE   Indication: Prolonged ileus  Patient Measurements: Height: 6' (182.9 cm) Weight: 94 kg (207 lb 3.7 oz) IBW/kg (Calculated) : 77.6 TPN AdjBW (KG): 82.6 Body mass index is 28.11 kg/m.  Assessment: 31 YOM with lower abdominal pain/N/V found to have perforated diverticulitis. CT abd on 9/3 found to have active ileus/pSBO from diverticulitis/bowel inflammation. Pharmacy consulted to start TPN for prolonged ileus. Of note patient has a h/o alcohol dependency and is at high risk for refeeding.   Glucose / Insulin: no hx DM, A1C 5.9, CBGs <180. SSI/24h: 17 units Electrolytes: Na 132, K 5.2 (goal >/=4 with ileus), Mg 2.1 (goal >/=2 with ileus), others wnl Renal: SCr 0.88 stable, BUN 24 Hepatic: LFTs / Tbili / TG wnl, Albumin 1.7 Intake / Output; MIVF: NG back up to 1261ml last 24h, Drain output 70 mL; none from colostomy, UOP not documented , LBM 9/6 GI Meds: IV Reglan/6hr, PPI VE/93Y ID: Complicated diverticulitis with intra abdominal abscess. Plan discharge on IV Zosyn.  GI Imaging:  - 9/3 CT abd: Active ileus/PSBO - 9/16 CT abd: L-iliopsoas fluid collection (abscess or hematoma), resolution of prior diverticular abscess, still w/ evidence of ileus  GI Surgeries / Procedures:  - 9/10 ex-lap, sigmoid colectomy w/ end colostomy creation, enterorrhaphy, drainage of intra-abd abscess, wound vac placement  Central access: PICC line 9/8 >>  TPN start date: 9/9>>   Nutritional Goals: Goal TPN rate is 85 mL/hr (provides 102 g of protein and 2104 kcals per day)  RD Assessment: Estimated Needs Total Energy Estimated Needs: 2000-2200 Total Protein Estimated Needs: 100-110 grams Total Fluid Estimated Needs: >2L  Current Nutrition:  TPN; NPO  Plan:  Continue TPN at goal rate 70mL/hr at 1800 (102g AA, 44.8g lipids (21.2%), 367g glucose (GIR 2.7), 2104 kcal) at 100% goal. Electrolytes in TPN: Na 150, K+ 0, Ca 63mEq/L, Mg 3mEq/L, and Phos  38mmol/L. Cl:Ac 1:1 Remove K+ from TPN for one day and watch UOP. Add standard MVI and trace elements, FA and thiamine to TPN Continue Moderate q4h SSI and adjust as needed Increase to 15 units insulin to TPN bag. TPN labs in AM   Sagal Gayton S. Alford Highland, PharmD, BCPS Clinical Staff Pharmacist [email or web address] 03/09/2021 7:13 AM

## 2021-03-09 NOTE — Progress Notes (Signed)
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 Wbc trending down again since surgery  Ileus awaiting repeat abd ct   Repeat abd ct 9/18 showed resolved previous abscess, but now new fluid collection left iliopsoas area. Unclear if post-op hematoma, but would have to presume infected in the setting previous abscess on proximal location/bowel surgery.   The epididymitis suspect similar spectrum of bacteria as coliform type, given his monogamous relationship and age. Urine gc/chlamydia negative. No risk factor sti.   Left knee pain ?crystal arthropathy vs OA vs septic arthritis   -------- 9/21 assessment No evidence crystal arthropathy or septic arthritis left knee joint Tolerating piptazo Still ileus Doing well otherwise    Plan: Continue piptazo Plan 4 weeks of piptazo until 10/17 and id clinic f/u Will need repeat imaging of abd/pelv with contrast in around 2 weeks Opat arranged If going to snf will need weekly cbc, cmp, crp faxed to [facility] clinic ID clinic f/u is on 10/06; if patient is still here in the hospital please call ID at that time for reevaluation and repeat abd/pelv ct Will sign off  OPAT Orders Discharge antibiotics to be given via PICC line  [place] Per Protocol:  Home health RN for IV administration and teaching; PICC line care and labs.    Labs weekly while on IV  antibiotics: _x_ CBC with differential __ BMP _x_ CMP _x_ CRP __ ESR __ Vancomycin trough __ CK  __ Please pull PIC at completion of IV antibiotics __ Please leave PIC in place until doctor has seen patient or been notified  Fax weekly labs to [phone]  Clinic Follow Up Appt: 10/06 @ 78   @  [facility] [place], [place], [place] [address] Phone: [phone]   I spent more than 35 minute reviewing data/chart, and coordinating care and >50% direct face to face time providing counseling/discussing diagnostics/treatment plan with patient    Principal Problem:   Diverticulitis of large intestine with perforation and abscess without bleeding Active Problems:   Diverticulitis of intestine with abscess   Atrial fibrillation, chronic ([place])   Essential hypertension   Diverticulitis   AKI (acute kidney injury) ([place])   Severe sepsis with acute organ dysfunction (H[facility])   Intra-abdominal abscess (H[facility])   Epididymitis   Allergies  Allergen Reactions   Atorvastatin Other (See Comments)    myalgias    Scheduled Meds:  amiodarone  200 mg Per NG tube BID   amLODipine  10 mg Per Tube Daily   Chlorhexidine Gluconate Cloth  6 each Topical Daily   insulin aspart  0-15 Units Subcutaneous Q4H  lidocaine  1 patch Transdermal Q24H   melatonin  3 mg Per Tube QHS   metoCLOPramide (REGLAN) injection  10 mg Intravenous Q6H   metoprolol tartrate  100 mg Per Tube BID   nicotine  21 mg Transdermal Daily   pantoprazole (PROTONIX) IV  40 mg Intravenous Q24H   sodium chloride flush  10 mL Intracatheter Q12H   Continuous Infusions:  sodium chloride Stopped (03/05/21 0320)   methocarbamol (ROBAXIN) IV 1,000 mg (03/09/21 1108)   piperacillin-tazobactam (ZOSYN)  IV 3.375 g (03/09/21 0851)   TPN ADULT (ION) 85 mL/hr at 03/08/21 1714   TPN ADULT (ION)     PRN Meds:.sodium chloride, butamben-tetracaine-benzocaine, hydrALAZINE, menthol-cetylpyridinium, metoprolol tartrate,  morphine injection, [DISCONTINUED] ondansetron **OR** ondansetron (ZOFRAN) IV, oxyCODONE, phenol, zolpidem   SUBJECTIVE: Cno f/c No rash Osteomy not functioning yet Joint fluid cx left knee negative; no crystal  Review of Systems: ROS All other ROS was negative, except mentioned above     OBJECTIVE: Vitals:   03/09/21 0400 03/09/21 0746 03/09/21 0800 03/09/21 1218  BP: (!) 120/53  (!) 125/56 (!) 118/52  Pulse: 71  70   Resp: 15  15   Temp: 98.1 F (36.7 C) 97.8 F (36.6 C)    TempSrc: Oral Oral    SpO2: 96%  96%   Weight:      Height:       Body mass index is 28.11 kg/m.  Physical Exam General/constitutional: no distress, pleasant HEENT: Normocephalic, PER, Conj Clear, EOMI, Oropharynx clear Neck supple CV: rrr no mrg Lungs: clear to auscultation, normal respiratory effort Abd: Soft, Nontender; osteomy no output Ext: no edema Skin: No Rash -- reviewed pictures of the surgical incision --> all showing clean tissue with granulation and no evidence infection Neuro: nonfocal MSK: no peripheral joint swelling/tenderness/warmth; back spines nontender    Lab Results Lab Results  Component Value Date   WBC 13.5 (H) 03/09/2021   HGB 8.0 (L) 03/09/2021   HCT 23.6 (L) 03/09/2021   MCV 100.4 (H) 03/09/2021   PLT 347 03/09/2021    Lab Results  Component Value Date   CREATININE 0.88 03/09/2021   BUN 24 (H) 03/09/2021   NA 132 (L) 03/09/2021   K 5.2 (H) 03/09/2021   CL 103 03/09/2021   CO2 23 03/09/2021    Lab Results  Component Value Date   ALT 13 03/08/2021   AST 16 03/08/2021   ALKPHOS 104 03/08/2021   BILITOT 1.0 03/08/2021      Microbiology: Recent Results (from the past 240 hour(s))  Body fluid culture w Gram Stain     Status: None (Preliminary result)   Collection Time: 03/08/21  9:27 AM   Specimen: Synovium; Body Fluid  Result Value Ref Range Status   Specimen Description SYNOVIAL FLUID  Final   Special Requests LEFT KNEE  Final   Gram Stain    Final    WBC PRESENT,BOTH PMN AND MONONUCLEAR NO ORGANISMS SEEN CYTOSPIN SMEAR    Culture   Final    NO GROWTH < 24 HOURS Performed at [facility], [place] [address] [place], [place] [address]    Report Status PENDING  Incomplete     Serology:   Imaging: If present, new imagings (plain films, ct scans, and mri) have been personally visualized and interpreted; radiology reports have been reviewed. Decision making incorporated into the Impression / Recommendations.  9/16 abd pelv ct 1. Large heterogeneous left iliopsoas collection may represent abscess, hematoma, or postoperative fluid collection.  Stranding in the retroperitoneum and left pararenal fat as well as subcutaneous tissues could be inflammatory or contusion. 2. Interval postoperative changes with left lower quadrant colostomy and left lower quadrant pelvic drainage catheter. The previous diverticular abscess has resolved. 3. Mild residual distention of the stomach and proximal small bowel. An enteric tube is present with tip just below the level of the EG junction. Advancement of 4-5 cm may be useful. 4. Small bilateral pleural effusions with basilar atelectasis. 5. Additional incidental findings as above.  Jabier Mutton, [facility] for Infectious [place] [phone] pager    03/09/2021, 1:11 PM

## 2021-03-09 NOTE — Progress Notes (Signed)
Nutrition Follow-up  DOCUMENTATION CODES:   Not applicable  INTERVENTION:   -TPN management per pharmacy  NUTRITION DIAGNOSIS:   Inadequate oral intake related to acute illness (diverticulitis) as evidenced by per patient/family report, NPO status.  Ongoing  GOAL:   Patient will meet greater than or equal to 90% of their needs  Met with TPN  MONITOR:   I & O's, Weight trends, Labs  REASON FOR ASSESSMENT:   Consult New TPN/TNA  ASSESSMENT:   Pt with PMH significant for HTN, Afib, h/o diverticulitis, heavy EtOH use, and high cholesterol admitted with severe sepsis due to diverticulitis with contained perforation and small abscess  9/08 - s/p drainage catheter placement of pelvic abscess 9/09 - TPN initiated 9/10 - s/p ex lap, sigmoid colectomy, end colostomy creation (Hartmann's procedure) w/ enterorrhaphy and drainage of intra-abdominal abscess and wound VAC placement of 50cm area of abdomen 9/16- CT revealed heterogenous left iliopsoas collection which may represent abscess or hematoma, stranding in the retroperitoneum and subcu tissue; per IR do no recommend aspiration or drainage at this time  9/20- s/p lt knee aspiration and injection due to lt knee effusions  Reviewed I/O's: +1.7 L x 24 hours and +5.6 L since 02/23/21  NGT output: 1.2 L x 24 hours  Drain output: 70 ml x 24 hours  Spoke with pt and wife at bedside. Pt reports feeling about the same. NGT remains connected to low intermittent suction.   Pt reports fair appetite PTA. He usually ate 4 times per day (Breakfast: eggs and grits; Lunch: reese's cupr OR celery with pimento cheese and ranch; Snack: lunchable; Dinner: meat, starch, and vegetable). Pt shares that he does not have his back molars and has to chew food very slowly. He also admits to consuming 1/5 of liquor daily PTA.   Pt denies any weight loss; he reports UBW is around 170-175#. Pt wife confirms wt stability. He has been less active lately  secondary to retirement (pt worked as a Curator and in apartment maintenance).   Pt remains NPO, dependent on TPN, which is running at 85 ml/hr and provides 2104 kcals and 102 grams protein, meeting 100% of estimated kcal and protein needs.  Discussed rationale for NPO status and how pt is currently receiving nutrition. Pt and wife appreciative of visit, but have no additional questions at this time.    Medications reviewed and include dulcolac, melatonin, and reglan.  Labs reviewed: Na: 132, K: 5.2, CBGS: 157-167 (inpatient orders for glycemic control are 0-15 units insulin aspart every 4 hours).    NUTRITION - FOCUSED PHYSICAL EXAM:  Flowsheet Row Most Recent Value  Orbital Region No depletion  Upper Arm Region No depletion  Thoracic and Lumbar Region No depletion  Buccal Region No depletion  Temple Region No depletion  Clavicle Bone Region No depletion  Clavicle and Acromion Bone Region No depletion  Scapular Bone Region No depletion  Dorsal Hand No depletion  Anterior Thigh Region No depletion  Posterior Calf Region No depletion  Edema (RD Assessment) Mild  Hair Reviewed  Eyes Reviewed  Mouth Reviewed  Skin Reviewed  Nails Reviewed       Diet Order:   Diet Order             Diet NPO time specified Except for: Sips with Meds  Diet effective now                   EDUCATION NEEDS:   No education needs have been identified  at this time  Skin:  Skin Assessment: Skin Integrity Issues: Skin Integrity Issues:: Wound VAC Wound Vac: abdomen Incisions: closed incision to abdomen  Last BM:  02/22/21  Height:   Ht Readings from Last 1 Encounters:  02/26/21 6' (1.829 m)    Weight:   Wt Readings from Last 1 Encounters:  03/07/21 94 kg   BMI:  Body mass index is 28.11 kg/m.  Estimated Nutritional Needs:   Kcal:  2000-2200  Protein:  100-110 grams  Fluid:  >2L    Loistine Chance, RD, LDN, [place] Registered Dietitian II Certified Diabetes Care and  Education Specialist Please refer to AM[facility] for RD and/or RD on-call/weekend/after hours pager

## 2021-03-09 NOTE — TOC Progression Note (Signed)
Transition of Care [facility]) - Progression Note    Patient Details  Name: Travis Conrad MRN: [id number] Date of Birth: 07-02-1950  [facility] CM/SW Contact  Joanne Chars, LCSW Phone Number: 03/09/2021, 8:29 AM  Clinical Narrative:   TOC continues to follow for DC needs.     Expected Discharge Plan: [place] Barriers to Discharge: Continued Medical Work up  Expected Discharge Plan and Services Expected Discharge Plan: [place] In-house Referral: Clinical Social Work   Post Acute Care Choice: [place] arrangements for the past 2 months: Single Family Home                                       Social Determinants of Health (SDOH) Interventions    Readmission Risk Interventions No flowsheet data found.

## 2021-03-10 DIAGNOSIS — I482 Chronic atrial fibrillation, unspecified: Secondary | ICD-10-CM | POA: Diagnosis not present

## 2021-03-10 DIAGNOSIS — K5792 Diverticulitis of intestine, part unspecified, without perforation or abscess without bleeding: Secondary | ICD-10-CM | POA: Diagnosis not present

## 2021-03-10 DIAGNOSIS — I10 Essential (primary) hypertension: Secondary | ICD-10-CM | POA: Diagnosis not present

## 2021-03-10 DIAGNOSIS — K5720 Diverticulitis of large intestine with perforation and abscess without bleeding: Secondary | ICD-10-CM | POA: Diagnosis not present

## 2021-03-10 LAB — CBC
HCT: 24.9 % — ABNORMAL LOW (ref 39.0–52.0)
Hemoglobin: 8.4 g/dL — ABNORMAL LOW (ref 13.0–17.0)
MCH: 34.1 pg — ABNORMAL HIGH (ref 26.0–34.0)
MCHC: 33.7 g/dL (ref 30.0–36.0)
MCV: 101.2 fL — ABNORMAL HIGH (ref 80.0–100.0)
Platelets: 413 10*3/uL — ABNORMAL HIGH (ref 150–400)
RBC: 2.46 MIL/uL — ABNORMAL LOW (ref 4.22–5.81)
RDW: 14.5 % (ref 11.5–15.5)
WBC: 14.3 10*3/uL — ABNORMAL HIGH (ref 4.0–10.5)
nRBC: 0 % (ref 0.0–0.2)

## 2021-03-10 LAB — COMPREHENSIVE METABOLIC PANEL
ALT: 20 U/L (ref 0–44)
AST: 20 U/L (ref 15–41)
Albumin: 1.7 g/dL — ABNORMAL LOW (ref 3.5–5.0)
Alkaline Phosphatase: 117 U/L (ref 38–126)
Anion gap: 6 (ref 5–15)
BUN: 25 mg/dL — ABNORMAL HIGH (ref 8–23)
CO2: 24 mmol/L (ref 22–32)
Calcium: 8.6 mg/dL — ABNORMAL LOW (ref 8.9–10.3)
Chloride: 102 mmol/L (ref 98–111)
Creatinine, Ser: 0.9 mg/dL (ref 0.61–1.24)
GFR, Estimated: >60 mL/min (ref >60)
Glucose, Bld: 129 mg/dL — ABNORMAL HIGH (ref 70–99)
Potassium: 4.7 mmol/L (ref 3.5–5.1)
Sodium: 132 mmol/L — ABNORMAL LOW (ref 135–145)
Total Bilirubin: 1.1 mg/dL (ref 0.3–1.2)
Total Protein: 5.5 g/dL — ABNORMAL LOW (ref 6.5–8.1)

## 2021-03-10 LAB — GLUCOSE, CAPILLARY
Glucose-Capillary: 133 mg/dL — ABNORMAL HIGH (ref 70–99)
Glucose-Capillary: 124 mg/dL — ABNORMAL HIGH (ref 70–99)
Glucose-Capillary: 119 mg/dL — ABNORMAL HIGH (ref 70–99)
Glucose-Capillary: 121 mg/dL — ABNORMAL HIGH (ref 70–99)
Glucose-Capillary: 110 mg/dL — ABNORMAL HIGH (ref 70–99)

## 2021-03-10 LAB — MAGNESIUM: Magnesium: 2.1 mg/dL (ref 1.7–2.4)

## 2021-03-10 LAB — PHOSPHORUS: Phosphorus: 4.3 mg/dL (ref 2.5–4.6)

## 2021-03-10 MED ORDER — TRAVASOL 10 % IV SOLN
INTRAVENOUS | Status: AC
  Filled 2021-03-10: qty 1020

## 2021-03-10 MED ORDER — ENOXAPARIN SODIUM 100 MG/ML IJ SOSY
100.0000 mg | PREFILLED_SYRINGE | Freq: Two times a day (BID) | INTRAMUSCULAR | Status: DC
  Administered 2021-03-10 – 2021-04-01 (×41): 100 mg via SUBCUTANEOUS
  Filled 2021-03-10 (×44): qty 1

## 2021-03-10 MED ORDER — BISACODYL 10 MG RE SUPP
10.0000 mg | Freq: Once | RECTAL | Status: AC
  Administered 2021-03-10: 10 mg via RECTAL
  Filled 2021-03-10: qty 1

## 2021-03-10 MED ORDER — ACETAMINOPHEN 500 MG PO TABS
1000.0000 mg | ORAL_TABLET | Freq: Four times a day (QID) | ORAL | Status: DC | PRN
  Administered 2021-03-12 – 2021-03-15 (×2): 1000 mg via ORAL
  Filled 2021-03-10 (×2): qty 2

## 2021-03-10 NOTE — Progress Notes (Signed)
PHARMACY - TOTAL PARENTERAL NUTRITION CONSULT NOTE  Indication: Prolonged ileus  Patient Measurements: Height: 6' (182.9 cm) Weight: 102 kg (224 lb 13.9 oz) IBW/kg (Calculated) : 77.6 TPN AdjBW (KG): 82.6 Body mass index is 30.5 kg/m.  Assessment:  39 YOM with lower abdominal pain/N/V found to have perforated diverticulitis. CT abd on 9/3 found to have active ileus/pSBO from diverticulitis/bowel inflammation. Pharmacy consulted to manage TPN for prolonged ileus. Of note patient has a history pf alcohol dependency and is at high risk for refeeding.   Glucose / Insulin: no hx DM, A1c 5.9% - CBGs < 180.  Used 12 units SSI in past 24 hrs Electrolytes: Na 132 (max in TPN), K down to 4.7 (none in TPN on 9/21, goal >/= 4 with ileus), others WNL (Phos and Ca high normal) Renal: SCr < 1 stable, BUN 25 Hepatic: LFTs / tbili / TG WNL, albumin 1.7 Intake / Output; MIVF: NG O/P 628mL, drain O/P 137mL, none from colostomy, UOP not documented, LBM 9/6 GI Meds: IV Reglan/6hr, PPI IV/24h ID: Zosyn through 64/68/03 for complicated diverticulitis with IA abscess GI Imaging:  - 9/3 CT abd: Active ileus/PSBO - 9/16 CT abd: L-iliopsoas fluid collection (abscess or hematoma), resolution of prior diverticular abscess, still w/ evidence of ileus GI Surgeries / Procedures:  - 9/10 ex-lap, sigmoid colectomy w/ end colostomy creation, enterorrhaphy, drainage of intra-abd abscess, wound vac placement  Central access: PICC line 02/24/21 TPN start date: 02/25/21  Nutritional Goals, RD Assessment Estimated Needs Total Energy Estimated Needs: 2000-2200 Total Protein Estimated Needs: 100-110 grams Total Fluid Estimated Needs: >2L  Current Nutrition:  TPN  Plan:  Continue TPN at goal rate 85 ml/hr to provide 102g AA, 367g CHO and 45g ILE for a total of 2104 kCal, meeting 100% of patient's needs Electrolytes in TPN: Na 173mEq/L, resume K at 90mEq/L (= 36mEq/day, was high on 72mEq/L), reduce Ca to 2.23mEq/L, Mg  61mEq/L, reduce Phos to 40mmol/L, Cl:Ac 1:1 Add standard MVI, trace elements, folate and thiamine to TPN Continue moderate SSI Q4H and 15 units regular insulin in TPN  Standard TPN labs on Mon and Thurs.  Repeat BMET/Phos on 9/24. F/U Surgery's plans - cycle TPN if discharging on TPN  Betta Balla D. Mina Marble, PharmD, BCPS, [place] 03/10/2021, 7:23 AM

## 2021-03-10 NOTE — Progress Notes (Signed)
Physical Therapy Treatment Patient Details Name: Travis Conrad MRN: [id number] DOB: Jul 24, 1950 Today's Date: 03/10/2021   History of Present Illness 70 yo admitted 9/3 with abdominal pain, vomiting and ileus. 9/4 Afib with RVR. 9/8 intrabdominal abscess s/p IR drain placed. 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 Pt found to have lt iliopsoas hematoma on CT 9/16.  9/20 left knee aspiration. PMhx: HTN, HLD, Afib, ETOH use    PT Comments    Pt tolerated treatment well with VSS on RA. Pt required decreased assist on bed mobility (supervision) compared to previous sessions with HOB flat and verbalized technique. Pt able to perform sit to stands with min guard assist and performed 2 trials in 30s with assist from armrests on chair and RW to achieve full upright position. Increased ambulation distance compared to previous session with min guard. Given pt's functional status and improvement with mobility, updated d/c recommend to HHPT with supervision.   [phone]) Vitals during - HR 77-101, spO2 >95% on RA Post-BP- 103/71(79)  Recommendations for follow up therapy are one component of a multi-disciplinary discharge planning process, led by the attending physician.  Recommendations may be updated based on patient status, additional functional criteria and insurance authorization.  Follow Up Recommendations  Supervision for mobility/OOB;Home health PT     Equipment Recommendations  None recommended by PT    Recommendations for Other Services       Precautions / Restrictions Precautions Precautions: Fall Precaution Comments: wound vAC, jp drain, NGtube Restrictions Weight Bearing Restrictions: No     Mobility  Bed Mobility Overal bed mobility: Needs Assistance Bed Mobility: Rolling;Sidelying to Sit Rolling: Supervision Sidelying to sit: Supervision       General bed mobility comments: HOB flat. Supervision for safety.  Pt verbalized technique of rolling and elevating trunk to sit EOB. Demonstrated great recall of previous cues provided. Patient Response: Cooperative  Transfers Overall transfer level: Needs assistance   Transfers: Sit to/from Stand Sit to Stand: Min guard;From elevated surface         General transfer comment: Min guard on sit to stand from bed for safety from elevated surface. Performed with increased time. Performed 2 sit to stands from recliner in 30s with use of armrests and RW to achieve full upright position.  Ambulation/Gait Ambulation/Gait assistance: Min guard Gait Distance (Feet): 200 Feet Assistive device: Rolling walker (2 wheeled) Gait Pattern/deviations: Trunk flexed;Decreased stride length;Step-through pattern Gait velocity: Decreased Gait velocity interpretation: 1.31 - 2.62 ft/sec, indicative of limited community ambulator General Gait Details: Min guard for safety with assist with lines. Verbal cues for upright posture. Pt able to self correct when demonstrating increased trunk flexion. Noted L hip pain during ambulation.   Stairs             Wheelchair Mobility    Modified Rankin (Stroke Patients Only)       Balance Overall balance assessment: Needs assistance Sitting-balance support: Feet supported;No upper extremity supported Sitting balance-Leahy Scale: Good Sitting balance - Comments: Able to maintain without UE support   Standing balance support: Bilateral upper extremity supported;During functional activity Standing balance-Leahy Scale: Poor Standing balance comment: Reliant on RW and BUE support when ambulating                            Cognition  Exercises General Exercises - Lower Extremity Hip Flexion/Marching: AROM;Both;15 reps;Seated (noted pain in L)    General Comments General comments (skin integrity, edema, etc.): VSS on RA; HR 77-101, spO2 >95%       Pertinent Vitals/Pain Faces Pain Scale: Hurts a little bit Pain Location: L hip with activity Pain Descriptors / Indicators: Aching Pain Intervention(s): Monitored during session;Premedicated before session    Home Living                      Prior Function            PT Goals (current goals can now be found in the care plan section) Progress towards PT goals: Progressing toward goals    Frequency    Min 3X/week      PT Plan Discharge plan needs to be updated    Co-evaluation              AM-PAC PT "6 Clicks" Mobility   Outcome Measure  Help needed turning from your back to your side while in a flat bed without using bedrails?: A Little Help needed moving from lying on your back to sitting on the side of a flat bed without using bedrails?: A Little Help needed moving to and from a bed to a chair (including a wheelchair)?: A Little Help needed standing up from a chair using your arms (e.g., wheelchair or bedside chair)?: A Little Help needed to walk in hospital room?: A Little Help needed climbing 3-5 steps with a railing? : A Lot 6 Click Score: 17    End of Session   Activity Tolerance: Patient tolerated treatment well Patient left: in chair;with call bell/phone within reach;with chair alarm set Nurse Communication: Mobility status PT Visit Diagnosis: Other abnormalities of gait and mobility (R26.89);Difficulty in walking, not elsewhere classified (R26.2)     Time: 3546-5681 PT Time Calculation (min) (ACUTE ONLY): 29 min  Charges:  $Gait Training: 8-22 mins $Therapeutic Exercise: 8-22 mins                     Louie Casa, SPT Acute Rehab: [phone]    Domingo Dimes 03/10/2021, 8:35 AM

## 2021-03-10 NOTE — Progress Notes (Addendum)
12 Days Post-Op  Subjective: CC: Patient with stable left sided crampy pain. No nausea. NGT output down (600cc/24 hours). Having some air and liquid stool from colostomy).   Objective: Vital signs in last 24 hours: Temp:  [97.5 F (36.4 C)-98.9 F (37.2 C)] 97.5 F (36.4 C) (09/22 0722) Pulse Rate:  [69-79] 77 (09/22 0722) Resp:  [16-19] 16 (09/22 0722) BP: (103-131)/(49-71) 103/71 (09/22 0722) SpO2:  [95 %-96 %] 95 % (09/22 0722) Weight:  [102 kg] 102 kg (09/22 0447) Last BM Date: 03/09/21  Intake/Output from previous day: 09/21 0701 - 09/22 0700 In: 250 [IV Piggyback:250] Out: 715 [Emesis/NG output:600; Drains:115] Intake/Output this shift: No intake/output data recorded.  PE: Gen:  Alert, NAD, pleasant Pulm:  Normal rate and effort. Abd: Soft, mild distension, mild left sided tenderness near ostomy and drain.  Drain w/ SS output. Stoma is pink, budded and appears viable. Air and liquid brown stool in colostomy bag. NGT w/ scant output. Vac to midline incision with good seal.  Skin: no rashes noted, warm and dry  Lab Results:  Recent Labs    03/09/21 0011 03/10/21 0512  WBC 13.5* 14.3*  HGB 8.0* 8.4*  HCT 23.6* 24.9*  PLT 347 413*   BMET Recent Labs    03/09/21 0011 03/10/21 0512  NA 132* 132*  K 5.2* 4.7  CL 103 102  CO2 23 24  GLUCOSE 182* 129*  BUN 24* 25*  CREATININE 0.88 0.90  CALCIUM 8.1* 8.6*   PT/INR No results for input(s): LABPROT, INR in the last 72 hours. CMP     Component Value Date/Time   NA 132 (L) 03/10/2021 0512   NA 134 09/30/2019 0959   NA 142 03/03/2016 0921   K 4.7 03/10/2021 0512   K 4.3 03/03/2016 0921   CL 102 03/10/2021 0512   CO2 24 03/10/2021 0512   CO2 25 03/03/2016 0921   GLUCOSE 129 (H) 03/10/2021 0512   GLUCOSE 89 03/03/2016 0921   BUN 25 (H) 03/10/2021 0512   BUN 11 09/30/2019 0959   BUN 13.6 03/03/2016 0921   CREATININE 0.90 03/10/2021 0512   CREATININE 1.2 03/03/2016 0921   CALCIUM 8.6 (L) 03/10/2021  0512   CALCIUM 9.6 03/03/2016 0921   PROT 5.5 (L) 03/10/2021 0512   PROT 7.3 03/03/2016 0921   ALBUMIN 1.7 (L) 03/10/2021 0512   ALBUMIN 3.8 03/03/2016 0921   AST 20 03/10/2021 0512   AST 36 (H) 03/03/2016 0921   ALT 20 03/10/2021 0512   ALT 31 03/03/2016 0921   ALKPHOS 117 03/10/2021 0512   ALKPHOS 116 03/03/2016 0921   BILITOT 1.1 03/10/2021 0512   BILITOT 0.71 03/03/2016 0921   GFRNONAA >60 03/10/2021 0512   GFRNONAA 67 07/25/2013 1712   GFRAA >60 10/03/2019 0930   GFRAA 77 07/25/2013 1712   Lipase     Component Value Date/Time   LIPASE 22 02/19/2021 1653    Studies/Results: No results found.  Anti-infectives: Anti-infectives (From admission, onward)    Start     Dose/Rate Route Frequency Ordered Stop   03/09/21 0800  piperacillin-tazobactam (ZOSYN) IVPB 3.375 g        3.375 g 12.5 mL/hr over 240 Minutes Intravenous Every 8 hours 03/09/21 0706 04/04/21 0759   02/26/21 0100  doxycycline (VIBRAMYCIN) 100 mg in sodium chloride 0.9 % 250 mL IVPB        100 mg 125 mL/hr over 120 Minutes Intravenous 2 times daily 02/26/21 0005 03/08/21 2359   02/20/21  0300  piperacillin-tazobactam (ZOSYN) IVPB 3.375 g        3.375 g 12.5 mL/hr over 240 Minutes Intravenous Every 8 hours 02/19/21 1907 03/08/21 2359   02/19/21 1900  ceFEPIme (MAXIPIME) 2 g in sodium chloride 0.9 % 100 mL IVPB  Status:  Discontinued        2 g 200 mL/hr over 30 Minutes Intravenous  Once 02/19/21 1857 02/19/21 1906   02/19/21 1900  metroNIDAZOLE (FLAGYL) IVPB 500 mg  Status:  Discontinued        500 mg 100 mL/hr over 60 Minutes Intravenous  Once 02/19/21 1857 02/19/21 1906   02/19/21 1815  piperacillin-tazobactam (ZOSYN) IVPB 3.375 g        3.375 g 100 mL/hr over 30 Minutes Intravenous  Once 02/19/21 1809 02/19/21 1944        Assessment/Plan POD 12 s/p ex lap, sigmoid colectomy, end colostomy with enterorrhaphy and drainage of intraabdominal abscess - Dr. Kieth Brightly and Dr. Radene Knee 9/10 for Acute sigmoid  diverticulitis with contained perforation and abscess - CT 9/16 demonstrates heterogenous left iliopsoas collection likely hematoma, stranding in the retroperitoneum and subcu tissue.  Distal colon appears decompressed and very small caliber but there is contrast entering this area; there is additionally a similarly small-caliber segment of colon in the proximal transverse and the ascending colon is not dilated- suspect over all this represents small bowel/gastric ileus.  IR does not recommend draining the hematoma at this point. ID following and recommending Zosyn through 10/174 w/ repeat CT in 2 weeks - Ileus appears to be improving. Air and stool in colostomy bag this am w/ less output from ngt. Clamping trial - Was started on reglan over the weekend. I discussed with him the risk of tardive dyskinesia/EPS on 9/20. Patient stated understanding and is okay with continuing Reglan.  - Cont drain  - Wound vac MWF - WOCN following for new ostomy teaching and vac changes - Cont TPN - Encouraged ambulation (working with therapies), use IS, multimodal pain control  - PT/OT - Cont abx per ID recs   FEN - Clamp NGT, NPO sips/chips, IVF, TPN. Ostomy Suppository  VTE - per primary with recs as noted below.  ID - Zosyn 9/4 - 10/17 (per ID). WBC (13.8>12.7>15.3>13.5>14.3). Afebrile. Foley - out, voiding.    Per primary: Hyperkalemia - resolved A fib - cards following  AKI - resolved ABL anemia -  7.7 > 6.7 > 1U PRBC > 8.1 > 8.1 > 9.5 >  8.0 > 8.4. Okay to restart heparin, no bolus HTN ETOH use L knee pain - per ortho    LOS: 18 days    Jillyn Ledger , [facility] 03/10/2021, 8:11 AM Please see Amion for pager number during day hours 7:00am-4:30pm

## 2021-03-10 NOTE — Progress Notes (Signed)
PROGRESS NOTE    Travis Conrad  XLK:[id number] DOB: 05-07-1951 DOA: 02/19/2021 PCP: Travis Mariscal, MD    Brief Narrative:  Travis Conrad was admitted to the hospital with the working diagnosis of severe sepsis due to perforated diverticulitis. Now sp lapartomy, with colectomy, complicated with postoperative ileus.   70 year old male past medical history for atrial fibrillation, dyslipidemia, hypertension and diverticulosis who presented with nausea, vomiting and abdominal pain.  Reported several days of worse abdominal pain, and poor oral intake.  Because of severe symptoms he presented to the hospital.  On his initial physical examination his blood pressure was 84/65, heart rate 57, respiratory rate 20, oxygen saturation 91%, he was ill-looking appearing, heart S1-S2, present, irregular irregular, lungs clear to auscultation, abdomen tender to palpation in the mid abdomen, no lower extremity edema.   CT of the abdomen and pelvis with acute sigmoid diverticulitis with small extraluminal gas and fluid collection in the sigmoid mesocolon, 3.5 x 1.5 x 2.7 cm consistent with perforation and abscess.  Dilated proximal and decompressed distal small bowel loops, possible ileus.   Patient underwent laparotomy with sigmoid colectomy and colostomy placement.  Postoperative ileus.   9/4 developed atrial fibrillation with rapid ventricular response, required intravenous amiodarone infusion. Follow-up CT 9/7 with increased size pelvic abscess. On 9/8 patient underwent CT-guided drainage of pelvic abscess.   Urology and ID have been consulted for epididymitis.   9/19 Postoperative anemia: Required 1 unit packed red blood cells.    Positive left knee pain, positive effusion, underwent left knee aspiration and injection with good toleration.  Fluid with no crystals, gram stain no organisms.  Slowly resolving ileus.   Assessment & Plan:   Principal Problem:   Diverticulitis of large intestine with  perforation and abscess without bleeding Active Problems:   Diverticulitis of intestine with abscess   Atrial fibrillation, chronic (HCC)   Essential hypertension   Diverticulitis   AKI (acute kidney injury) ([place])   Severe sepsis with acute organ dysfunction (HCC)   Intra-abdominal abscess (HCC)   Epididymitis     Severe sepsis due to perforated diverticulitis, sp colectomy. Left psoas collection. Post operative ileus.  Ng tube has been clamped today, patient with no nausea or vomiting. Positive stool in colostomy bag.  Continue with TPN for nutrition,  Wbc is 14 today.    Antibiotic therapy with IV zosyn, plan to continue until 10.17.22 Patient has a PICC line in place.    2. Post operative anemia. sp 1 unit PRBC  Follow up hgb today is 8,4 and hct 24,9.    3. Paroxysmal atrial fibrillation/ HTN.  Continue with amiodarone and metoprolol  for rate control.  Will start patient on anticoagulation with enoxaparin and advance to apixaban when able to take po medications.    On amlodipine and as needed hydralazine for blood pressure control.    4. Right epididymitis resolved, completed therapy with doxycycline.    5. AKI with hypokalemia/ hyperkalemia,hyponatremia and hypophosphatemia.  Patient NPO and getting TPN. Renal function stable with serum cr at 0,90 K is down to 4,7 and Na is 132 with bicarbonate at 24. Follow up electrolytes    6. Alcohol withdrawal. No clinical signs of acute withdrawal.    7. Left knee arthritis. Clinically improved after steroid injection.   Today pain is well controlled.   Status is: Inpatient  Remains inpatient appropriate because:Inpatient level of care appropriate due to severity of illness  Dispo: The patient is from: Home  Anticipated d/c is to: Home              Patient currently is not medically stable to d/c.   Difficult to place patient No   DVT prophylaxis: Enoxaparin   Code Status:    full  Family Communication:   No family at the bedside      Nutrition Status: Nutrition Problem: Inadequate oral intake Etiology: acute illness (diverticulitis) Signs/Symptoms: per patient/family report, NPO status Interventions: TPN     Consultants:  Surgery  ID   Procedures: colectomy with ostomy   Antimicrobials:  Zosyn     Subjective: Patient is feeling better, his NG tube has been clamped, no nausea or vomiting, no dyspnea or chest pain.   Objective: Vitals:   03/09/21 2358 03/10/21 0447 03/10/21 0722 03/10/21 1201  BP: (!) 122/57 (!) 126/59 103/71 (!) 120/52  Pulse: 69  77 66  Resp: 16  16 19   Temp: 98.1 F (36.7 C) 98.9 F (37.2 C) (!) 97.5 F (36.4 C) 97.6 F (36.4 C)  TempSrc: Axillary Oral Oral Oral  SpO2: 96%  95% 97%  Weight:  102 kg    Height:        Intake/Output Summary (Last 24 hours) at 03/10/2021 1348 Last data filed at 03/09/2021 2122 Gross per 24 hour  Intake --  Output 115 ml  Net -115 ml   Filed Weights   03/06/21 0348 03/07/21 0342 03/10/21 0447  Weight: 85.6 kg 94 kg 102 kg    Examination:   General: Not in pain or dyspnea Neurology: Awake and alert, non focal  E ENT: no pallor, no icterus, oral mucosa moist. Ng tube in place.  Cardiovascular: No JVD. S1-S2 present, rhythmic, no gallops, rubs, or murmurs. No lower extremity edema. Pulmonary: positive breath sounds bilaterally, adequate air movement, no wheezing, rhonchi or rales. Gastrointestinal. Abdomen soft and non tender. Surgical wound clean, ostomy in place with stool in the collection bag Skin. No rashes Musculoskeletal: no joint deformities     Data Reviewed: I have personally reviewed following labs and imaging studies  CBC: Recent Labs  Lab 03/07/21 0412 03/07/21 1730 03/08/21 0026 03/08/21 0808 03/09/21 0011 03/10/21 0512  WBC 13.8*  --  12.7* 15.3* 13.5* 14.3*  HGB 6.7* 8.1* 8.1* 9.5* 8.0* 8.4*  HCT 19.8* 23.7* 24.0* 28.0* 23.6* 24.9*  MCV 102.6*  --  100.4* 100.4* 100.4* 101.2*   PLT 351  --  361 425* 347 250*   Basic Metabolic Panel: Recent Labs  Lab 03/05/21 0129 03/06/21 0732 03/07/21 0412 03/08/21 0808 03/09/21 0011 03/10/21 0512  NA 128* 130* 130* 132* 132* 132*  K 4.7 4.8 4.6 4.6 5.2* 4.7  CL 97* 102 100 102 103 102  CO2 21* 21* 22 22 23 24   GLUCOSE 166* 145* 137* 137* 182* 129*  BUN 17 28* 27* 26* 24* 25*  CREATININE 0.89 1.01 1.05 0.96 0.88 0.90  CALCIUM 8.4* 8.0* 8.0* 8.5* 8.1* 8.6*  MG 2.1  --  2.1  --  2.1 2.1  PHOS 3.7  --  4.3  --  3.9 4.3   GFR: Estimated Creatinine Clearance: 94.4 mL/min (by C-G formula based on SCr of 0.9 mg/dL). Liver Function Tests: Recent Labs  Lab 03/04/21 1120 03/05/21 0129 03/07/21 0412 03/08/21 0808 03/10/21 0512  AST 12* 12* 13* 16 20  ALT 9 11 13 13 20   ALKPHOS 60 68 78 104 117  BILITOT 0.5 0.5 0.6 1.0 1.1  PROT 4.8* 5.1* 4.7* 5.9* 5.5*  ALBUMIN 1.6* 1.6* 1.3* 1.7* 1.7*   No results for input(s): LIPASE, AMYLASE in the last 168 hours. No results for input(s): AMMONIA in the last 168 hours. Coagulation Profile: No results for input(s): INR, PROTIME in the last 168 hours. Cardiac Enzymes: No results for input(s): CKTOTAL, CKMB, CKMBINDEX, TROPONINI in the last 168 hours. BNP (last 3 results) No results for input(s): PROBNP in the last 8760 hours. HbA1C: No results for input(s): HGBA1C in the last 72 hours. CBG: Recent Labs  Lab 03/09/21 2008 03/09/21 2357 03/10/21 0443 03/10/21 0722 03/10/21 1146  GLUCAP 126* 134* 133* 124* 119*   Lipid Profile: No results for input(s): CHOL, HDL, LDLCALC, TRIG, CHOLHDL, LDLDIRECT in the last 72 hours. Thyroid Function Tests: No results for input(s): TSH, T4TOTAL, FREET4, T3FREE, THYROIDAB in the last 72 hours. Anemia Panel: No results for input(s): VITAMINB12, FOLATE, FERRITIN, TIBC, IRON, RETICCTPCT in the last 72 hours.    Radiology Studies: I have reviewed all of the imaging during this hospital visit personally     Scheduled Meds:   amiodarone  200 mg Per NG tube BID   amLODipine  10 mg Per Tube Daily   Chlorhexidine Gluconate Cloth  6 each Topical Daily   insulin aspart  0-15 Units Subcutaneous Q4H   lidocaine  1 patch Transdermal Q24H   melatonin  3 mg Per Tube QHS   metoCLOPramide (REGLAN) injection  10 mg Intravenous Q6H   metoprolol tartrate  100 mg Per Tube BID   nicotine  21 mg Transdermal Daily   pantoprazole (PROTONIX) IV  40 mg Intravenous Q24H   sodium chloride flush  10 mL Intracatheter Q12H   Continuous Infusions:  sodium chloride Stopped (03/05/21 0320)   methocarbamol (ROBAXIN) IV 1,000 mg (03/10/21 1232)   piperacillin-tazobactam (ZOSYN)  IV 3.375 g (03/10/21 0907)   TPN ADULT (ION) 85 mL/hr at 03/09/21 1707   TPN ADULT (ION)       LOS: 18 days        Krystyl Cannell Gerome Apley, MD

## 2021-03-10 NOTE — Progress Notes (Signed)
ANTICOAGULATION CONSULT NOTE - Follow Up Consult  Pharmacy Consult for Lovenox Indication: atrial fibrillation  Allergies  Allergen Reactions   Atorvastatin Other (See Comments)    myalgias    Patient Measurements: Height: 6' (182.9 cm) Weight: 102 kg (224 lb 13.9 oz) IBW/kg (Calculated) : 77.6  Vital Signs: Temp: 97.6 F (36.4 C) (09/22 1201) Temp Source: Oral (09/22 1201) BP: 120/52 (09/22 1201) Pulse Rate: 66 (09/22 1201)  Labs: Recent Labs    03/08/21 0808 03/09/21 0011 03/10/21 0512  HGB 9.5* 8.0* 8.4*  HCT 28.0* 23.6* 24.9*  PLT 425* 347 413*  CREATININE 0.96 0.88 0.90     Estimated Creatinine Clearance: 94.4 mL/min (by C-G formula based on SCr of 0.9 mg/dL).   Assessment: 70 y/o male admitted for sepsis from contained perforated diverticulitis with active ileus. On PTA Xarelto for Afib with last dose on 9/2 at 2000. He was on a heparin drip previously which was held on 9/19. Pharmacy consulted for Lovenox.   9/16 CT with possible psoas hematoma 9/19 Hgb 6.7 - given 1 unit PRBC 9/22 Hgb stable 8s, platelets are normal  Goal of Therapy:  Anti-Xa level Goal:  0.6-1 units/mL 4 hours post dose Monitor platelets by anticoagulation protocol: Yes   Plan:  Lovenox 100 mg SQ q12h CBC at least q72h while on Lovenox F/U ability to transition to oral agent  Thank you for involving pharmacy in this patient's care.  Renold Genta, PharmD, BCPS Clinical Pharmacist Clinical phone for 03/10/2021 until 3p is [phone] 03/10/2021 2:53 PM  **Pharmacist phone directory can be found on [email or web address] listed under [place]**

## 2021-03-10 NOTE — Plan of Care (Signed)
  Problem: Health Behavior/Discharge Planning: Goal: Ability to manage health-related needs will improve Outcome: Not Progressing   Problem: Clinical Measurements: Goal: Ability to maintain clinical measurements within normal limits will improve Outcome: Not Progressing Goal: Will remain free from infection Outcome: Not Progressing Goal: Diagnostic test results will improve Outcome: Not Progressing Goal: Respiratory complications will improve Outcome: Not Progressing Goal: Cardiovascular complication will be avoided Outcome: Not Progressing   Problem: Activity: Goal: Risk for activity intolerance will decrease Outcome: Not Progressing   Problem: Nutrition: Goal: Adequate nutrition will be maintained Outcome: Not Progressing   Problem: Coping: Goal: Level of anxiety will decrease Outcome: Not Progressing   Problem: Elimination: Goal: Will not experience complications related to bowel motility Outcome: Not Progressing Goal: Will not experience complications related to urinary retention Outcome: Not Progressing   Problem: Pain Managment: Goal: General experience of comfort will improve Outcome: Not Progressing

## 2021-03-11 ENCOUNTER — Inpatient Hospital Stay (HOSPITAL_COMMUNITY): Payer: Medicare Other

## 2021-03-11 DIAGNOSIS — K5792 Diverticulitis of intestine, part unspecified, without perforation or abscess without bleeding: Secondary | ICD-10-CM | POA: Diagnosis not present

## 2021-03-11 DIAGNOSIS — N179 Acute kidney failure, unspecified: Secondary | ICD-10-CM | POA: Diagnosis not present

## 2021-03-11 DIAGNOSIS — K5720 Diverticulitis of large intestine with perforation and abscess without bleeding: Secondary | ICD-10-CM | POA: Diagnosis not present

## 2021-03-11 DIAGNOSIS — I482 Chronic atrial fibrillation, unspecified: Secondary | ICD-10-CM | POA: Diagnosis not present

## 2021-03-11 LAB — CBC
HCT: 23.2 % — ABNORMAL LOW (ref 39.0–52.0)
Hemoglobin: 7.9 g/dL — ABNORMAL LOW (ref 13.0–17.0)
MCH: 34.5 pg — ABNORMAL HIGH (ref 26.0–34.0)
MCHC: 34.1 g/dL (ref 30.0–36.0)
MCV: 101.3 fL — ABNORMAL HIGH (ref 80.0–100.0)
Platelets: 415 10*3/uL — ABNORMAL HIGH (ref 150–400)
RBC: 2.29 MIL/uL — ABNORMAL LOW (ref 4.22–5.81)
RDW: 14.4 % (ref 11.5–15.5)
WBC: 11.9 10*3/uL — ABNORMAL HIGH (ref 4.0–10.5)
nRBC: 0 % (ref 0.0–0.2)

## 2021-03-11 LAB — BODY FLUID CULTURE W GRAM STAIN: Culture: NO GROWTH

## 2021-03-11 LAB — GLUCOSE, CAPILLARY
Glucose-Capillary: 121 mg/dL — ABNORMAL HIGH (ref 70–99)
Glucose-Capillary: 126 mg/dL — ABNORMAL HIGH (ref 70–99)
Glucose-Capillary: 132 mg/dL — ABNORMAL HIGH (ref 70–99)
Glucose-Capillary: 137 mg/dL — ABNORMAL HIGH (ref 70–99)
Glucose-Capillary: 138 mg/dL — ABNORMAL HIGH (ref 70–99)
Glucose-Capillary: 143 mg/dL — ABNORMAL HIGH (ref 70–99)

## 2021-03-11 MED ORDER — BISACODYL 10 MG RE SUPP
10.0000 mg | Freq: Once | RECTAL | Status: AC
  Administered 2021-03-11: 10 mg via RECTAL
  Filled 2021-03-11: qty 1

## 2021-03-11 MED ORDER — TRAVASOL 10 % IV SOLN
INTRAVENOUS | Status: AC
  Filled 2021-03-11: qty 1020

## 2021-03-11 NOTE — Progress Notes (Signed)
Cardiac monitor removed per new orders for LOC change.  Informed CCMD.

## 2021-03-11 NOTE — Consult Note (Signed)
[place] Nurse wound follow up Patient receiving care in [facility] 2W01 Wound type: Surgical midline incision Wound bed: Clean, pink granulation tissue Drainage (amount, consistency, odor) Dark reddish brown drainage in canister Periwound: intact Dressing procedure/placement/frequency: One piece of black foam dressing removed. One piece of black foam replaced. Drape applied, immediate seal obtained at 125 mmHg. WOC will continue to follow M/W/F  [place] Nurse ostomy follow up Stoma type/location: LUQ colostomy Stomal assessment/size: 1 3/8" red budded just above the level of the skin. Sutures in place.  Peristomal assessment: intact Treatment options for stomal/peristomal skin: Barrier ring Output: dark brown/green mushy, about 1/4 of the pouch was full.  Ostomy pouching: 1pc. convex Education provided: Wife was able to go through the entire process of changing the pouch with very little direction. Reviewed proper cleaning, frequency of pouch changes, when to empty.  Enrolled patient in [place] Start Discharge program: Yes (previously) Wife states she still has not received the kit. Will send in again today.   Cathlean Marseilles Tamala Julian, MSN, RN, [place], [name], [facility] Wound Treatment Associate Pager [phone]

## 2021-03-11 NOTE — Progress Notes (Signed)
PROGRESS NOTE    Travis Conrad  IEP:[id number] DOB: June 24, 1950 DOA: 02/19/2021 PCP: [name], MD    Brief Narrative:  Travis Conrad was admitted to the hospital with the working diagnosis of severe sepsis due to perforated diverticulitis. Now sp lapartomy, with colectomy, complicated with postoperative ileus.   70 year old male past medical history for atrial fibrillation, dyslipidemia, hypertension and diverticulosis who presented with nausea, vomiting and abdominal pain.  Reported several days of worse abdominal pain, and poor oral intake.  Because of severe symptoms he presented to the hospital.  On his initial physical examination his blood pressure was 84/65, heart rate 57, respiratory rate 20, oxygen saturation 91%, he was ill-looking appearing, heart S1-S2, present, irregular irregular, lungs clear to auscultation, abdomen tender to palpation in the mid abdomen, no lower extremity edema.   CT of the abdomen and pelvis with acute sigmoid diverticulitis with small extraluminal gas and fluid collection in the sigmoid mesocolon, 3.5 x 1.5 x 2.7 cm consistent with perforation and abscess.  Dilated proximal and decompressed distal small bowel loops, possible ileus.   Patient underwent laparotomy with sigmoid colectomy and colostomy placement.  Postoperative ileus.   9/4 developed atrial fibrillation with rapid ventricular response, required intravenous amiodarone infusion. Follow-up CT 9/7 with increased size pelvic abscess. On 9/8 patient underwent CT-guided drainage of pelvic abscess.   Urology and ID have been consulted for epididymitis.   9/19 Postoperative anemia: Required 1 unit packed red blood cells.    Positive left knee pain, positive effusion, underwent left knee aspiration and injection with good toleration.  Fluid with no crystals, gram stain no organisms.   Slowly resolving ileus.    Assessment & Plan:   Principal Problem:   Diverticulitis of large intestine with  perforation and abscess without bleeding Active Problems:   Diverticulitis of intestine with abscess   Atrial fibrillation, chronic (HCC)   Essential hypertension   Diverticulitis   AKI (acute kidney injury) ([place])   Severe sepsis with acute organ dysfunction (HCC)   Intra-abdominal abscess (HCC)   Epididymitis   Severe sepsis due to perforated diverticulitis, sp colectomy. Left psoas collection. Post operative ileus.  Patient vomited today while the ng tube was clamped. Tube was placed back to suction. His abdomen is distended but passing flatus.   Continue TPN for nutrition Possible clamping tube in am, follow with surgery recommendations.    On IV zosyn, plan to continue until 10.17.22 PICC line in place.    2. Post operative anemia. sp 1 unit PRBC  White cell count today is 11,9, with Hgb at 7,9 and hct at 34.5    3. Paroxysmal atrial fibrillation/ HTN. On amiodarone and metoprolol with good rate control.    Anticoagulation with enoxaparin .    Continue with amlodipine for blood pressure control.   4. Right epididymitis resolved, completed therapy with doxycycline.    5. AKI with hypokalemia/ hyperkalemia,hyponatremia and hypophosphatemia.  TPN for nutrition.  Travis Conrad has resolved, electrolytes have been corrected.   6. Alcohol withdrawal. No acute clinical signs of withdrawal.    7. Left knee arthritis. Clinically improved after steroid injection.   Today pain is well controlled.      Patient continue to be at high risk for worsening ileus   Status is: Inpatient  Remains inpatient appropriate because:Inpatient level of care appropriate due to severity of illness  Dispo: The patient is from: Home              Anticipated d/c is  to: Home              Patient currently is not medically stable to d/c.   Difficult to place patient No   DVT prophylaxis: Enoxaparin   Code Status:    full  Family Communication:   No family at the bedside      Nutrition  Status: Nutrition Problem: Inadequate oral intake Etiology: acute illness (diverticulitis) Signs/Symptoms: per patient/family report, NPO status Interventions: TPN     Consultants:  Surgery    Antimicrobials:  Zosyn    Subjective: Patient this am with episode of vomiting while tube clamped, no chest pain or dyspnea. Positive flatus.   Objective: Vitals:   03/11/21 0018 03/11/21 0449 03/11/21 0749 03/11/21 0837  BP: (!) 112/52 (!) 110/55 (!) 122/54 (!) 117/47  Pulse: 68 68 72 74  Resp: 17 17 16    Temp: 98.1 F (36.7 C) 97.8 F (36.6 C) 98 F (36.7 C)   TempSrc: Oral Oral Oral   SpO2: 96% 95% 94%   Weight:      Height:        Intake/Output Summary (Last 24 hours) at 03/11/2021 1523 Last data filed at 03/11/2021 1300 Gross per 24 hour  Intake 2749.68 ml  Output 1110 ml  Net 1639.68 ml   Filed Weights   03/06/21 0348 03/07/21 0342 03/10/21 0447  Weight: 85.6 kg 94 kg 102 kg    Examination:   General: Not in pain or dyspnea. Deconditioned  Neurology: Awake and alert, non focal  E ENT: no pallor, no icterus, oral mucosa moist Cardiovascular: No JVD. S1-S2 present, rhythmic, no gallops, rubs, or murmurs. No lower extremity edema. Pulmonary: vesicular breath sounds bilaterally, adequate air movement, no wheezing, rhonchi or rales. Gastrointestinal. Abdomen mild distended, not tender to palpation, ostomy in place, no stool at the time of my examination Skin. No rashes Musculoskeletal: no joint deformities     Data Reviewed: I have personally reviewed following labs and imaging studies  CBC: Recent Labs  Lab 03/08/21 0026 03/08/21 0808 03/09/21 0011 03/10/21 0512 03/11/21 0159  WBC 12.7* 15.3* 13.5* 14.3* 11.9*  HGB 8.1* 9.5* 8.0* 8.4* 7.9*  HCT 24.0* 28.0* 23.6* 24.9* 23.2*  MCV 100.4* 100.4* 100.4* 101.2* 101.3*  PLT 361 425* 347 413* 161*   Basic Metabolic Panel: Recent Labs  Lab 03/05/21 0129 03/06/21 0732 03/07/21 0412 03/08/21 0808  03/09/21 0011 03/10/21 0512  NA 128* 130* 130* 132* 132* 132*  K 4.7 4.8 4.6 4.6 5.2* 4.7  CL 97* 102 100 102 103 102  CO2 21* 21* 22 22 23 24   GLUCOSE 166* 145* 137* 137* 182* 129*  BUN 17 28* 27* 26* 24* 25*  CREATININE 0.89 1.01 1.05 0.96 0.88 0.90  CALCIUM 8.4* 8.0* 8.0* 8.5* 8.1* 8.6*  MG 2.1  --  2.1  --  2.1 2.1  PHOS 3.7  --  4.3  --  3.9 4.3   GFR: Estimated Creatinine Clearance: 94.4 mL/min (by C-G formula based on SCr of 0.9 mg/dL). Liver Function Tests: Recent Labs  Lab 03/05/21 0129 03/07/21 0412 03/08/21 0808 03/10/21 0512  AST 12* 13* 16 20  ALT 11 13 13 20   ALKPHOS 68 78 104 117  BILITOT 0.5 0.6 1.0 1.1  PROT 5.1* 4.7* 5.9* 5.5*  ALBUMIN 1.6* 1.3* 1.7* 1.7*   No results for input(s): LIPASE, AMYLASE in the last 168 hours. No results for input(s): AMMONIA in the last 168 hours. Coagulation Profile: No results for input(s): INR, PROTIME in  the last 168 hours. Cardiac Enzymes: No results for input(s): CKTOTAL, CKMB, CKMBINDEX, TROPONINI in the last 168 hours. BNP (last 3 results) No results for input(s): PROBNP in the last 8760 hours. HbA1C: No results for input(s): HGBA1C in the last 72 hours. CBG: Recent Labs  Lab 03/10/21 2007 03/11/21 0017 03/11/21 0451 03/11/21 0817 03/11/21 1204  GLUCAP 110* 132* 121* 137* 126*   Lipid Profile: No results for input(s): CHOL, HDL, LDLCALC, TRIG, CHOLHDL, LDLDIRECT in the last 72 hours. Thyroid Function Tests: No results for input(s): TSH, T4TOTAL, FREET4, T3FREE, THYROIDAB in the last 72 hours. Anemia Panel: No results for input(s): VITAMINB12, FOLATE, FERRITIN, TIBC, IRON, RETICCTPCT in the last 72 hours.    Radiology Studies: I have reviewed all of the imaging during this hospital visit personally     Scheduled Meds:  amiodarone  200 mg Per NG tube BID   amLODipine  10 mg Per Tube Daily   Chlorhexidine Gluconate Cloth  6 each Topical Daily   enoxaparin (LOVENOX) injection  100 mg Subcutaneous  Q12H   insulin aspart  0-15 Units Subcutaneous Q4H   lidocaine  1 patch Transdermal Q24H   melatonin  3 mg Per Tube QHS   metoCLOPramide (REGLAN) injection  10 mg Intravenous Q6H   metoprolol tartrate  100 mg Per Tube BID   nicotine  21 mg Transdermal Daily   pantoprazole (PROTONIX) IV  40 mg Intravenous Q24H   sodium chloride flush  10 mL Intracatheter Q12H   Continuous Infusions:  sodium chloride Stopped (03/05/21 0320)   methocarbamol (ROBAXIN) IV 1,000 mg (03/11/21 1215)   piperacillin-tazobactam (ZOSYN)  IV 3.375 g (03/11/21 0828)   TPN ADULT (ION) 85 mL/hr at 03/10/21 1716   TPN ADULT (ION)       LOS: 19 days        Morgaine Kimball Gerome Apley, MD

## 2021-03-11 NOTE — Progress Notes (Signed)
PHARMACY - TOTAL PARENTERAL NUTRITION CONSULT NOTE  Indication: Prolonged ileus  Patient Measurements: Height: 6' (182.9 cm) Weight: 102 kg (224 lb 13.9 oz) IBW/kg (Calculated) : 77.6 TPN AdjBW (KG): 82.6 Body mass index is 30.5 kg/m.  Assessment:  43 YOM with lower abdominal pain/N/V found to have perforated diverticulitis. CT abd on 9/3 found to have active ileus/pSBO from diverticulitis/bowel inflammation. Pharmacy consulted to manage TPN for prolonged ileus. Of note patient has a history pf alcohol dependency and is at high risk for refeeding.   Glucose / Insulin: no hx DM, A1c 5.9% - CBGs < 180.  Used 8 units SSI in past 24 hrs, 15 units insulin in TPN Electrolytes: 9/22 labs - Na 132 (max in TPN), K down to 4.7 (none in TPN on 9/21, goal >/= 4 with ileus), others WNL (Phos and Ca high normal) Renal: SCr < 1 stable, BUN 25 Hepatic: LFTs / tbili / TG WNL, albumin 1.7 Intake / Output; MIVF: NG O/P 29mL, drain O/P 80mL, none from colostomy, UOP 0.5 ml/kg/hr, LBM 9/6 GI Meds: IV Reglan/6hr, PPI IV/24h ID: Zosyn through 97/02/63 for complicated diverticulitis with IA abscess GI Imaging:  - 9/3 CT abd: Active ileus/PSBO - 9/16 CT abd: L-iliopsoas fluid collection (abscess or hematoma), resolution of prior diverticular abscess, still w/ evidence of ileus GI Surgeries / Procedures:  - 9/10 ex-lap, sigmoid colectomy w/ end colostomy creation, enterorrhaphy, drainage of intra-abd abscess, wound vac placement  Central access: PICC line 02/24/21 TPN start date: 02/25/21  Nutritional Goals, RD Assessment Estimated Needs Total Energy Estimated Needs: 2000-2200 Total Protein Estimated Needs: 100-110 grams Total Fluid Estimated Needs: >2L  Current Nutrition:  TPN Start CLD 9/23 and may remove NGT this PM  Plan:  Continue TPN at goal rate 85 ml/hr to provide 102g AA, 367g CHO and 45g ILE for a total of 2104 kCal, meeting 100% of patient's needs Electrolytes in TPN: Na 177mEq/L, resume K at  10mEq/L on 9/22 (= 19mEq/day, was high on 55mEq/L), reduce Ca to 2.72mEq/L on 9/22, Mg 52mEq/L, reduce Phos to 72mmol/L on 9/22, Cl:Ac 1:1 - no change today 9/23 Add standard MVI, trace elements, folate and thiamine to TPN Continue moderate SSI Q4H and 15 units regular insulin in TPN  Standard TPN labs on Mon and Thurs.  Repeat BMET/Phos in AM F/U PO intake/diet advancement, CBGs  Travis Conrad, PharmD, BCPS, [place] 03/11/2021, 8:36 AM

## 2021-03-11 NOTE — Progress Notes (Signed)
13 Days Post-Op  Subjective: CC: Tolerated the ngt being clamped without n/v. Continues to have air and stool in colostomy. Having some mild epigastric abdominal tightness/pain that is new and is nervous about getting ngt out. Worked with pt yesterday. Voiding.   Objective: Vital signs in last 24 hours: Temp:  [97.6 F (36.4 C)-98.3 F (36.8 C)] 98 F (36.7 C) (09/23 0749) Pulse Rate:  [66-75] 72 (09/23 0749) Resp:  [16-19] 16 (09/23 0749) BP: (110-131)/(51-57) 122/54 (09/23 0749) SpO2:  [94 %-97 %] 94 % (09/23 0749) Last BM Date: 03/11/21  Intake/Output from previous day: 09/22 0701 - 09/23 0700 In: 2339.7 [I.V.:2135.5; IV Piggyback:204.2] Out: 1110 [Urine:1110] Intake/Output this shift: No intake/output data recorded.  PE: Gen:  Alert, NAD, pleasant Pulm:  Normal rate and effort. Abd: Soft, mild upper abdominal distension, mild epigastric and left sided tenderness.  Drain w/ SS output. Stoma is pink, budded and appears viable. Air and liquid brown stool in colostomy bag. NGT w/ scant output. Vac to midline incision with good seal.  Skin: no rashes noted, warm and dry  Lab Results:  Recent Labs    03/10/21 0512 03/11/21 0159  WBC 14.3* 11.9*  HGB 8.4* 7.9*  HCT 24.9* 23.2*  PLT 413* 415*   BMET Recent Labs    03/09/21 0011 03/10/21 0512  NA 132* 132*  K 5.2* 4.7  CL 103 102  CO2 23 24  GLUCOSE 182* 129*  BUN 24* 25*  CREATININE 0.88 0.90  CALCIUM 8.1* 8.6*   PT/INR No results for input(s): LABPROT, INR in the last 72 hours. CMP     Component Value Date/Time   NA 132 (L) 03/10/2021 0512   NA 134 09/30/2019 0959   NA 142 03/03/2016 0921   K 4.7 03/10/2021 0512   K 4.3 03/03/2016 0921   CL 102 03/10/2021 0512   CO2 24 03/10/2021 0512   CO2 25 03/03/2016 0921   GLUCOSE 129 (H) 03/10/2021 0512   GLUCOSE 89 03/03/2016 0921   BUN 25 (H) 03/10/2021 0512   BUN 11 09/30/2019 0959   BUN 13.6 03/03/2016 0921   CREATININE 0.90 03/10/2021 0512    CREATININE 1.2 03/03/2016 0921   CALCIUM 8.6 (L) 03/10/2021 0512   CALCIUM 9.6 03/03/2016 0921   PROT 5.5 (L) 03/10/2021 0512   PROT 7.3 03/03/2016 0921   ALBUMIN 1.7 (L) 03/10/2021 0512   ALBUMIN 3.8 03/03/2016 0921   AST 20 03/10/2021 0512   AST 36 (H) 03/03/2016 0921   ALT 20 03/10/2021 0512   ALT 31 03/03/2016 0921   ALKPHOS 117 03/10/2021 0512   ALKPHOS 116 03/03/2016 0921   BILITOT 1.1 03/10/2021 0512   BILITOT 0.71 03/03/2016 0921   GFRNONAA >60 03/10/2021 0512   GFRNONAA 67 07/25/2013 1712   GFRAA >60 10/03/2019 0930   GFRAA 77 07/25/2013 1712   Lipase     Component Value Date/Time   LIPASE 22 02/19/2021 1653    Studies/Results: No results found.  Anti-infectives: Anti-infectives (From admission, onward)    Start     Dose/Rate Route Frequency Ordered Stop   03/09/21 0800  piperacillin-tazobactam (ZOSYN) IVPB 3.375 g        3.375 g 12.5 mL/hr over 240 Minutes Intravenous Every 8 hours 03/09/21 0706 04/04/21 0759   02/26/21 0100  doxycycline (VIBRAMYCIN) 100 mg in sodium chloride 0.9 % 250 mL IVPB        100 mg 125 mL/hr over 120 Minutes Intravenous 2 times daily 02/26/21 0005  03/08/21 2359   02/20/21 0300  piperacillin-tazobactam (ZOSYN) IVPB 3.375 g        3.375 g 12.5 mL/hr over 240 Minutes Intravenous Every 8 hours 02/19/21 1907 03/08/21 2359   02/19/21 1900  ceFEPIme (MAXIPIME) 2 g in sodium chloride 0.9 % 100 mL IVPB  Status:  Discontinued        2 g 200 mL/hr over 30 Minutes Intravenous  Once 02/19/21 1857 02/19/21 1906   02/19/21 1900  metroNIDAZOLE (FLAGYL) IVPB 500 mg  Status:  Discontinued        500 mg 100 mL/hr over 60 Minutes Intravenous  Once 02/19/21 1857 02/19/21 1906   02/19/21 1815  piperacillin-tazobactam (ZOSYN) IVPB 3.375 g        3.375 g 100 mL/hr over 30 Minutes Intravenous  Once 02/19/21 1809 02/19/21 1944        Assessment/Plan POD 13 s/p ex lap, sigmoid colectomy, end colostomy with enterorrhaphy and drainage of intraabdominal  abscess - Dr. Kieth Brightly and Dr. Radene Knee 9/10 for Acute sigmoid diverticulitis with contained perforation and abscess - CT 9/16 demonstrates heterogenous left iliopsoas collection likely hematoma, stranding in the retroperitoneum and subcu tissue.  Distal colon appears decompressed and very small caliber but there is contrast entering this area; there is additionally a similarly small-caliber segment of colon in the proximal transverse and the ascending colon is not dilated- suspect over all this represents small bowel/gastric ileus.  IR does not recommend draining the hematoma at this point. ID following and recommending Zosyn through 10/174 w/ repeat CT in 2 weeks - Ileus appears to be improving. Air and stool in colostomy bag. Tolerated clamping overnight but nervous about getting ngt out. CLD this am and d/c ngt this pm if tolerates - Was started on reglan over the weekend. I discussed with him the risk of tardive dyskinesia/EPS on 9/20. Patient stated understanding and is okay with continuing Reglan.  - Cont drain  - Wound vac MWF - will see vac change on Monday  - WOCN following for new ostomy teaching and vac changes - Cont TPN - Encouraged ambulation (working with therapies), use IS, multimodal pain control  - PT/OT - Cont abx per ID recs   FEN - CLD, Clamp NGT (d/c PM if tolerating CLD), IVF, TPN. Ostomy Suppository  VTE - SCDs, therapeutic lovenox  ID - Zosyn 9/4 - 10/17 (per ID). WBC (13.8>12.7>15.3>13.5>14.3>11.9). Afebrile. Foley - out, voiding.    Per primary: Hyperkalemia - resolved A fib - cards following  AKI - resolved ABL anemia -  7.7 > 6.7 > 1U PRBC > 8.1 > 8.1 > 9.5 >  8.0 > 8.4 > 7.9 HTN ETOH use L knee pain - per ortho    LOS: 19 days    Jillyn Ledger , [facility] 03/11/2021, 7:55 AM Please see Amion for pager number during day hours 7:00am-4:30pm

## 2021-03-12 DIAGNOSIS — I482 Chronic atrial fibrillation, unspecified: Secondary | ICD-10-CM | POA: Diagnosis not present

## 2021-03-12 DIAGNOSIS — N179 Acute kidney failure, unspecified: Secondary | ICD-10-CM | POA: Diagnosis not present

## 2021-03-12 DIAGNOSIS — K5720 Diverticulitis of large intestine with perforation and abscess without bleeding: Secondary | ICD-10-CM | POA: Diagnosis not present

## 2021-03-12 DIAGNOSIS — K5792 Diverticulitis of intestine, part unspecified, without perforation or abscess without bleeding: Secondary | ICD-10-CM | POA: Diagnosis not present

## 2021-03-12 LAB — GLUCOSE, CAPILLARY
Glucose-Capillary: 102 mg/dL — ABNORMAL HIGH (ref 70–99)
Glucose-Capillary: 106 mg/dL — ABNORMAL HIGH (ref 70–99)
Glucose-Capillary: 127 mg/dL — ABNORMAL HIGH (ref 70–99)
Glucose-Capillary: 134 mg/dL — ABNORMAL HIGH (ref 70–99)
Glucose-Capillary: 143 mg/dL — ABNORMAL HIGH (ref 70–99)
Glucose-Capillary: 148 mg/dL — ABNORMAL HIGH (ref 70–99)
Glucose-Capillary: 159 mg/dL — ABNORMAL HIGH (ref 70–99)

## 2021-03-12 LAB — PHOSPHORUS: Phosphorus: 3.4 mg/dL (ref 2.5–4.6)

## 2021-03-12 LAB — BASIC METABOLIC PANEL
Anion gap: 6 (ref 5–15)
BUN: 23 mg/dL (ref 8–23)
CO2: 24 mmol/L (ref 22–32)
Calcium: 8 mg/dL — ABNORMAL LOW (ref 8.9–10.3)
Chloride: 105 mmol/L (ref 98–111)
Creatinine, Ser: 0.85 mg/dL (ref 0.61–1.24)
GFR, Estimated: >60 mL/min (ref >60)
Glucose, Bld: 120 mg/dL — ABNORMAL HIGH (ref 70–99)
Potassium: 3.6 mmol/L (ref 3.5–5.1)
Sodium: 135 mmol/L (ref 135–145)

## 2021-03-12 MED ORDER — TRAVASOL 10 % IV SOLN
INTRAVENOUS | Status: AC
  Filled 2021-03-12: qty 1020

## 2021-03-12 MED ORDER — POTASSIUM CHLORIDE 10 MEQ/50ML IV SOLN
10.0000 meq | INTRAVENOUS | Status: AC
  Administered 2021-03-12 (×3): 10 meq via INTRAVENOUS
  Filled 2021-03-12 (×3): qty 50

## 2021-03-12 NOTE — Progress Notes (Signed)
14 Days Post-Op  Subjective: CC: Did not tolerate clamping trial yesterday with upper abdominal tightness and emesis x 2. Back to LIWS this am with bilious output in cannister. Pain is improved since going back to LIWS. Still having air and stool from ostomy however. Voiding.   Objective: Vital signs in last 24 hours: Temp:  [97.7 F (36.5 C)-98.7 F (37.1 C)] 97.7 F (36.5 C) (09/24 0414) Pulse Rate:  [64-94] 80 (09/24 0414) Resp:  [14-19] 19 (09/24 0414) BP: (102-130)/(47-59) 104/57 (09/24 0414) SpO2:  [95 %-100 %] 95 % (09/24 0414) Last BM Date: 03/12/21  Intake/Output from previous day: 09/23 0701 - 09/24 0700 In: 1577.3 [P.O.:360; I.V.:821.5; NG/GT:50; IV Piggyback:345.8] Out: 2315 [Urine:1900; Emesis/NG output:300; Drains:115] Intake/Output this shift: No intake/output data recorded.  PE: Gen:  Alert, NAD, pleasant Pulm:  Normal rate and effort. Abd: Soft, mild upper abdominal distension, mild epigastric and left sided tenderness.  Drain w/ SS output. Stoma unable to be visualized 2/2 stool. Air and stool in colostomy bag. NGT w/ bilious output. Vac to midline incision with good seal.  Msk: SCD's in place Skin: no rashes noted, warm and dry  Lab Results:  Recent Labs    03/10/21 0512 03/11/21 0159  WBC 14.3* 11.9*  HGB 8.4* 7.9*  HCT 24.9* 23.2*  PLT 413* 415*   BMET Recent Labs    03/10/21 0512 03/12/21 0407  NA 132* 135  K 4.7 3.6  CL 102 105  CO2 24 24  GLUCOSE 129* 120*  BUN 25* 23  CREATININE 0.90 0.85  CALCIUM 8.6* 8.0*   PT/INR No results for input(s): LABPROT, INR in the last 72 hours. CMP     Component Value Date/Time   NA 135 03/12/2021 0407   NA 134 09/30/2019 0959   NA 142 03/03/2016 0921   K 3.6 03/12/2021 0407   K 4.3 03/03/2016 0921   CL 105 03/12/2021 0407   CO2 24 03/12/2021 0407   CO2 25 03/03/2016 0921   GLUCOSE 120 (H) 03/12/2021 0407   GLUCOSE 89 03/03/2016 0921   BUN 23 03/12/2021 0407   BUN 11 09/30/2019 0959    BUN 13.6 03/03/2016 0921   CREATININE 0.85 03/12/2021 0407   CREATININE 1.2 03/03/2016 0921   CALCIUM 8.0 (L) 03/12/2021 0407   CALCIUM 9.6 03/03/2016 0921   PROT 5.5 (L) 03/10/2021 0512   PROT 7.3 03/03/2016 0921   ALBUMIN 1.7 (L) 03/10/2021 0512   ALBUMIN 3.8 03/03/2016 0921   AST 20 03/10/2021 0512   AST 36 (H) 03/03/2016 0921   ALT 20 03/10/2021 0512   ALT 31 03/03/2016 0921   ALKPHOS 117 03/10/2021 0512   ALKPHOS 116 03/03/2016 0921   BILITOT 1.1 03/10/2021 0512   BILITOT 0.71 03/03/2016 0921   GFRNONAA >60 03/12/2021 0407   GFRNONAA 67 07/25/2013 1712   GFRAA >60 10/03/2019 0930   GFRAA 77 07/25/2013 1712   Lipase     Component Value Date/Time   LIPASE 22 02/19/2021 1653    Studies/Results: DG Abd Portable 1V  Result Date: 03/11/2021 CLINICAL DATA:  NG tube placement.  Ileus. EXAM: PORTABLE ABDOMEN - 1 VIEW COMPARISON:  Radiograph 03/05/2021 FINDINGS: Tip and side port of the enteric tube are below the diaphragm, the enteric tube is looped in the stomach. Gaseous distention of bowel loops in the central abdomen appears similar to prior exam. Small amount of formed stool in the included colon. IMPRESSION: Tip and side port of the enteric tube below the  diaphragm, enteric tube looped in the stomach. Electronically Signed   By: Keith Rake M.D.   On: 03/11/2021 18:40    Anti-infectives: Anti-infectives (From admission, onward)    Start     Dose/Rate Route Frequency Ordered Stop   03/09/21 0800  piperacillin-tazobactam (ZOSYN) IVPB 3.375 g        3.375 g 12.5 mL/hr over 240 Minutes Intravenous Every 8 hours 03/09/21 0706 04/04/21 0759   02/26/21 0100  doxycycline (VIBRAMYCIN) 100 mg in sodium chloride 0.9 % 250 mL IVPB        100 mg 125 mL/hr over 120 Minutes Intravenous 2 times daily 02/26/21 0005 03/08/21 2359   02/20/21 0300  piperacillin-tazobactam (ZOSYN) IVPB 3.375 g        3.375 g 12.5 mL/hr over 240 Minutes Intravenous Every 8 hours 02/19/21 1907 03/08/21  2359   02/19/21 1900  ceFEPIme (MAXIPIME) 2 g in sodium chloride 0.9 % 100 mL IVPB  Status:  Discontinued        2 g 200 mL/hr over 30 Minutes Intravenous  Once 02/19/21 1857 02/19/21 1906   02/19/21 1900  metroNIDAZOLE (FLAGYL) IVPB 500 mg  Status:  Discontinued        500 mg 100 mL/hr over 60 Minutes Intravenous  Once 02/19/21 1857 02/19/21 1906   02/19/21 1815  piperacillin-tazobactam (ZOSYN) IVPB 3.375 g        3.375 g 100 mL/hr over 30 Minutes Intravenous  Once 02/19/21 1809 02/19/21 1944        Assessment/Plan POD 14 s/p ex lap, sigmoid colectomy, end colostomy with enterorrhaphy and drainage of intraabdominal abscess - Dr. Kieth Brightly and Dr. Radene Knee 9/10 for Acute sigmoid diverticulitis with contained perforation and abscess - CT 9/16 demonstrates heterogenous left iliopsoas collection likely hematoma, stranding in the retroperitoneum and subcu tissue.  Distal colon appears decompressed and very small caliber but there is contrast entering this area; there is additionally a similarly small-caliber segment of colon in the proximal transverse and the ascending colon is not dilated- suspect over all this represents small bowel/gastric ileus.  IR does not recommend draining the hematoma at this point. ID following and recommending Zosyn through 10/174 w/ repeat CT in 2 weeks - Air and stool in colostomy bag but failed clamping trial 9/23. Keep on LIWS today and consider repeat clamping trial tomorrow.  - Was started on reglan last weekend. I discussed with him the risk of tardive dyskinesia/EPS on 9/20. Patient stated understanding and is okay with continuing Reglan.  - Cont drain  - Wound vac MWF - WOCN following for new ostomy teaching and vac changes - Cont TPN - Encouraged ambulation (working with therapies), use IS, multimodal pain control  - PT/OT - Cont abx per ID recs   FEN - NGT, IVF, TPN.  VTE - SCDs, therapeutic lovenox  ID - Zosyn 9/4 - 10/17 (per ID). WBC  (13.8>12.7>15.3>13.5>14.3>11.9). Afebrile. Foley - out, voiding.    Per primary: Hyperkalemia - resolved A fib - cards following  AKI - resolved ABL anemia -  7.7 > 6.7 > 1U PRBC > 8.1 > 8.1 > 9.5 >  8.0 > 8.4 > 7.9 HTN ETOH use L knee pain - per ortho   LOS: 20 days    Jillyn Ledger , [facility] 03/12/2021, 8:00 AM Please see Amion for pager number during day hours 7:00am-4:30pm

## 2021-03-12 NOTE — Progress Notes (Signed)
PROGRESS NOTE    Travis Conrad  HYQ:[id number] DOB: 11/09/1950 DOA: 02/19/2021 PCP: [name], MD    Brief Narrative:  Travis Conrad was admitted to the hospital with the working diagnosis of severe sepsis due to perforated diverticulitis. Now sp lapartomy, with colectomy, complicated with postoperative ileus.   70 year old male past medical history for atrial fibrillation, dyslipidemia, hypertension and diverticulosis who presented with nausea, vomiting and abdominal pain.  Reported several days of worse abdominal pain, and poor oral intake.  Because of severe symptoms he presented to the hospital.  On his initial physical examination his blood pressure was 84/65, heart rate 57, respiratory rate 20, oxygen saturation 91%, he was ill-looking appearing, heart S1-S2, present, irregular irregular, lungs clear to auscultation, abdomen tender to palpation in the mid abdomen, no lower extremity edema.   CT of the abdomen and pelvis with acute sigmoid diverticulitis with small extraluminal gas and fluid collection in the sigmoid mesocolon, 3.5 x 1.5 x 2.7 cm consistent with perforation and abscess.  Dilated proximal and decompressed distal small bowel loops, possible ileus.   Patient underwent laparotomy with sigmoid colectomy and colostomy placement.  Postoperative ileus.   9/4 developed atrial fibrillation with rapid ventricular response, required intravenous amiodarone infusion. Follow-up CT 9/7 with increased size pelvic abscess. On 9/8 patient underwent CT-guided drainage of pelvic abscess.   Urology and ID have been consulted for epididymitis.   9/19 Postoperative anemia: Required 1 unit packed red blood cells.    Positive left knee pain, positive effusion, underwent left knee aspiration and injection with good toleration.  Fluid with no crystals, gram stain no organisms.   Slowly resolving ileus. Continue to have vomiting, NG tube in place.    Assessment & Plan:   Principal  Problem:   Diverticulitis of large intestine with perforation and abscess without bleeding Active Problems:   Diverticulitis of intestine with abscess   Atrial fibrillation, chronic (HCC)   Essential hypertension   Diverticulitis   AKI (acute kidney injury) ([place])   Severe sepsis with acute organ dysfunction (HCC)   Intra-abdominal abscess (HCC)   Epididymitis   Severe sepsis due to perforated diverticulitis, sp colectomy. Left psoas collection. Post operative ileus.  Positive vomiting last night, continue NG tube to suction. Positive stool in ostomy collection bag. Positive flatus.   Plan to continue supportive medical care, NG tube to suction, until vomiting improves.   TPN for nutrition   Continue with IV zosyn, stop on 10.17.22 PICC line in place.  Out of bed to chair tid, ambulate in the hallway, continue with PT and OT.  Patient on metoclopramide q 6 hrs    2. Post operative anemia. sp 1 unit PRBC  His cell count has been stable.    3. Paroxysmal atrial fibrillation/ HTN. Continue with amiodarone and metoprolol with good rate control.    Continue with enoxaparin for anticoagulation, will change to DOAC when able to tolerate po.    On amlodipine for blood pressure control.   4. Right epididymitis resolved, completed therapy with doxycycline.    5. AKI with hypokalemia/ hyperkalemia,hyponatremia and hypophosphatemia.  TPN for nutrition. Renal  function with serum cr at 0,85, K is 3,6 and serum bicarbonate at 24.    6. Alcohol withdrawal. No withdrawal.    7. Left knee arthritis. Clinically improved after steroid injection.   Today pain is well controlled.    Patient continue to be at high risk for worsening ileus   Status is: Inpatient  Remains inpatient  appropriate because:Inpatient level of care appropriate due to severity of illness  Dispo: The patient is from: Home              Anticipated d/c is to: Home              Patient currently is not medically  stable to d/c.   Difficult to place patient No   DVT prophylaxis: Enoxaparin   Code Status:    full  Family Communication:   I spoke with patient's wife at the bedside, we talked in detail about patient's condition, plan of care and prognosis and all questions were addressed.      Nutrition Status: Nutrition Problem: Inadequate oral intake Etiology: acute illness (diverticulitis) Signs/Symptoms: per patient/family report, NPO status Interventions: TPN    Consultants:  Surgery   Procedures:  Colectomy and ostomy   Antimicrobials:  Zosyn     Subjective: Patient had vomiting last night, NG tube back to suction, no chest pain or dyspnea. Mild discomfort on the back of left knee.   Objective: Vitals:   03/11/21 2020 03/12/21 0020 03/12/21 0414 03/12/21 0900  BP: (!) 130/57 (!) 102/49 (!) 104/57 101/62  Pulse: 94 64 80 65  Resp: 14 15 19    Temp: 98.2 F (36.8 C) 97.7 F (36.5 C) 97.7 F (36.5 C)   TempSrc: Oral Oral Oral   SpO2: 100% 100% 95%   Weight:      Height:        Intake/Output Summary (Last 24 hours) at 03/12/2021 1013 Last data filed at 03/12/2021 0900 Gross per 24 hour  Intake 1287.27 ml  Output 2415 ml  Net -1127.73 ml   Filed Weights   03/06/21 0348 03/07/21 0342 03/10/21 0447  Weight: 85.6 kg 94 kg 102 kg    Examination:   General: Not in pain or dyspnea. Deconditioned  Neurology: Awake and alert, non focal  E ENT: mild pallor, no icterus, oral mucosa moist. NG tube in place.  Cardiovascular: No JVD. S1-S2 present, rhythmic, no gallops, rubs, or murmurs. No lower extremity edema. Pulmonary: vesicular breath sounds bilaterally, adequate air movement, no wheezing, rhonchi or rales. Gastrointestinal. Abdomen mild distended, surgical wound with wound vac in place, ostomy in place with soft stool Skin. No rashes Musculoskeletal: no joint deformities     Data Reviewed: I have personally reviewed following labs and imaging  studies  CBC: Recent Labs  Lab 03/08/21 0026 03/08/21 0808 03/09/21 0011 03/10/21 0512 03/11/21 0159  WBC 12.7* 15.3* 13.5* 14.3* 11.9*  HGB 8.1* 9.5* 8.0* 8.4* 7.9*  HCT 24.0* 28.0* 23.6* 24.9* 23.2*  MCV 100.4* 100.4* 100.4* 101.2* 101.3*  PLT 361 425* 347 413* 161*   Basic Metabolic Panel: Recent Labs  Lab 03/07/21 0412 03/08/21 0808 03/09/21 0011 03/10/21 0512 03/12/21 0407  NA 130* 132* 132* 132* 135  K 4.6 4.6 5.2* 4.7 3.6  CL 100 102 103 102 105  CO2 22 22 23 24 24   GLUCOSE 137* 137* 182* 129* 120*  BUN 27* 26* 24* 25* 23  CREATININE 1.05 0.96 0.88 0.90 0.85  CALCIUM 8.0* 8.5* 8.1* 8.6* 8.0*  MG 2.1  --  2.1 2.1  --   PHOS 4.3  --  3.9 4.3 3.4   GFR: Estimated Creatinine Clearance: 100 mL/min (by C-G formula based on SCr of 0.85 mg/dL). Liver Function Tests: Recent Labs  Lab 03/07/21 0412 03/08/21 0808 03/10/21 0512  AST 13* 16 20  ALT 13 13 20   ALKPHOS 78 104 117  BILITOT 0.6 1.0 1.1  PROT 4.7* 5.9* 5.5*  ALBUMIN 1.3* 1.7* 1.7*   No results for input(s): LIPASE, AMYLASE in the last 168 hours. No results for input(s): AMMONIA in the last 168 hours. Coagulation Profile: No results for input(s): INR, PROTIME in the last 168 hours. Cardiac Enzymes: No results for input(s): CKTOTAL, CKMB, CKMBINDEX, TROPONINI in the last 168 hours. BNP (last 3 results) No results for input(s): PROBNP in the last 8760 hours. HbA1C: No results for input(s): HGBA1C in the last 72 hours. CBG: Recent Labs  Lab 03/11/21 1641 03/11/21 2026 03/12/21 0020 03/12/21 0413 03/12/21 0813  GLUCAP 138* 143* 134* 106* 148*   Lipid Profile: No results for input(s): CHOL, HDL, LDLCALC, TRIG, CHOLHDL, LDLDIRECT in the last 72 hours. Thyroid Function Tests: No results for input(s): TSH, T4TOTAL, FREET4, T3FREE, THYROIDAB in the last 72 hours. Anemia Panel: No results for input(s): VITAMINB12, FOLATE, FERRITIN, TIBC, IRON, RETICCTPCT in the last 72 hours.    Radiology  Studies: I have reviewed all of the imaging during this hospital visit personally     Scheduled Meds:  amiodarone  200 mg Per NG tube BID   amLODipine  10 mg Per Tube Daily   Chlorhexidine Gluconate Cloth  6 each Topical Daily   enoxaparin (LOVENOX) injection  100 mg Subcutaneous Q12H   insulin aspart  0-15 Units Subcutaneous Q4H   lidocaine  1 patch Transdermal Q24H   melatonin  3 mg Per Tube QHS   metoCLOPramide (REGLAN) injection  10 mg Intravenous Q6H   metoprolol tartrate  100 mg Per Tube BID   nicotine  21 mg Transdermal Daily   pantoprazole (PROTONIX) IV  40 mg Intravenous Q24H   sodium chloride flush  10 mL Intracatheter Q12H   Continuous Infusions:  sodium chloride Stopped (03/05/21 0320)   methocarbamol (ROBAXIN) IV 1,000 mg (03/12/21 0619)   piperacillin-tazobactam (ZOSYN)  IV 3.375 g (03/12/21 0918)   potassium chloride     TPN ADULT (ION) 85 mL/hr at 03/11/21 1748   TPN ADULT (ION)       LOS: 20 days        Travis Schonberg Gerome Apley, MD

## 2021-03-12 NOTE — Progress Notes (Signed)
PHARMACY - TOTAL PARENTERAL NUTRITION CONSULT NOTE  Indication: Prolonged ileus  Patient Measurements: Height: 6' (182.9 cm) Weight: 102 kg (224 lb 13.9 oz) IBW/kg (Calculated) : 77.6 TPN AdjBW (KG): 82.6 Body mass index is 30.5 kg/m.  Assessment:  86 YOM with lower abdominal pain/N/V found to have perforated diverticulitis. CT abd on 9/3 found to have active ileus/pSBO from diverticulitis/bowel inflammation. Pharmacy consulted to manage TPN for prolonged ileus. Of note patient has a history pf alcohol dependency and is at high risk for refeeding.   Glucose / Insulin: no hx DM, A1c 5.9% - CBGs < 180.  Used 10 units SSI in past 24 hrs, 15 units insulin in TPN Electrolytes: Na up to 135 (max in TPN), K down to 3.6 (goal >/= 4 with ileus), others WNL Renal: SCr < 1 stable, BUN WNL Hepatic: LFTs / tbili / TG WNL, albumin 1.7 Intake / Output; MIVF: NG O/P 361mL, drain O/P 172mL, none from colostomy, UOP 0.8 ml/kg/hr, LBM 9/6, vomited x2 9/23 when NGT clamped GI Meds: IV Reglan/6hr, PPI IV/24h ID: Zosyn through 20/94/70 for complicated diverticulitis with IA abscess GI Imaging:  - 9/3 CT abd: Active ileus/PSBO - 9/16 CT abd: L-iliopsoas fluid collection (abscess or hematoma), resolution of prior diverticular abscess, still w/ evidence of ileus GI Surgeries / Procedures:  - 9/10 ex-lap, sigmoid colectomy w/ end colostomy creation, enterorrhaphy, drainage of intra-abd abscess, wound vac placement  Central access: PICC line 02/24/21 TPN start date: 02/25/21  Nutritional Goals, RD Assessment Estimated Needs Total Energy Estimated Needs: 2000-2200 Total Protein Estimated Needs: 100-110 grams Total Fluid Estimated Needs: >2L  Current Nutrition:  TPN Start CLD 9/23  Plan:  Continue TPN at goal rate 85 ml/hr to provide 102g AA, 367g CHO and 45g ILE for a total of 2104 kCal, meeting 100% of patient's needs Electrolytes in TPN: Na 164mEq/L, increase K to 80mEq/L (was high on 56mEq/L), Ca  2.15mEq/L, Mg 76mEq/L, Phos 34mmol/L, Cl:Ac 1:1 Add standard MVI, trace elements, folate and thiamine to TPN Continue moderate SSI Q4H and 15 units regular insulin in TPN  KCL x 3 runs Standard TPN labs on Mon and Thurs  May repeat clamping trial again 9/25  Dae Highley D. Mina Marble, PharmD, BCPS, [place] 03/12/2021, 9:37 AM

## 2021-03-12 NOTE — Plan of Care (Signed)
  Problem: Health Behavior/Discharge Planning: Goal: Ability to manage health-related needs will improve Outcome: Not Progressing   Problem: Clinical Measurements: Goal: Ability to maintain clinical measurements within normal limits will improve Outcome: Not Progressing Goal: Will remain free from infection Outcome: Not Progressing Goal: Diagnostic test results will improve Outcome: Not Progressing Goal: Respiratory complications will improve Outcome: Not Progressing Goal: Cardiovascular complication will be avoided Outcome: Not Progressing   Problem: Activity: Goal: Risk for activity intolerance will decrease Outcome: Not Progressing   Problem: Nutrition: Goal: Adequate nutrition will be maintained Outcome: Not Progressing   Problem: Coping: Goal: Level of anxiety will decrease Outcome: Not Progressing   Problem: Elimination: Goal: Will not experience complications related to bowel motility Outcome: Not Progressing Goal: Will not experience complications related to urinary retention Outcome: Not Progressing

## 2021-03-13 DIAGNOSIS — N179 Acute kidney failure, unspecified: Secondary | ICD-10-CM | POA: Diagnosis not present

## 2021-03-13 DIAGNOSIS — K5720 Diverticulitis of large intestine with perforation and abscess without bleeding: Secondary | ICD-10-CM | POA: Diagnosis not present

## 2021-03-13 DIAGNOSIS — I482 Chronic atrial fibrillation, unspecified: Secondary | ICD-10-CM | POA: Diagnosis not present

## 2021-03-13 DIAGNOSIS — K5792 Diverticulitis of intestine, part unspecified, without perforation or abscess without bleeding: Secondary | ICD-10-CM | POA: Diagnosis not present

## 2021-03-13 LAB — GLUCOSE, CAPILLARY
Glucose-Capillary: 134 mg/dL — ABNORMAL HIGH (ref 70–99)
Glucose-Capillary: 160 mg/dL — ABNORMAL HIGH (ref 70–99)
Glucose-Capillary: 138 mg/dL — ABNORMAL HIGH (ref 70–99)
Glucose-Capillary: 126 mg/dL — ABNORMAL HIGH (ref 70–99)
Glucose-Capillary: 105 mg/dL — ABNORMAL HIGH (ref 70–99)

## 2021-03-13 LAB — CBC
HCT: 25.3 % — ABNORMAL LOW (ref 39.0–52.0)
Hemoglobin: 8.1 g/dL — ABNORMAL LOW (ref 13.0–17.0)
MCH: 33.1 pg (ref 26.0–34.0)
MCHC: 32 g/dL (ref 30.0–36.0)
MCV: 103.3 fL — ABNORMAL HIGH (ref 80.0–100.0)
Platelets: 406 10*3/uL — ABNORMAL HIGH (ref 150–400)
RBC: 2.45 MIL/uL — ABNORMAL LOW (ref 4.22–5.81)
RDW: 14.2 % (ref 11.5–15.5)
WBC: 7.7 10*3/uL (ref 4.0–10.5)
nRBC: 0 % (ref 0.0–0.2)

## 2021-03-13 MED ORDER — FOLIC ACID 5 MG/ML IJ SOLN
INTRAMUSCULAR | Status: AC
Start: 2021-03-13 — End: 2021-03-14
  Filled 2021-03-13 (×4): qty 1020

## 2021-03-13 MED ORDER — FAMOTIDINE 40 MG/5ML PO SUSR
20.0000 mg | Freq: Every day | ORAL | Status: DC
  Administered 2021-03-13 – 2021-03-14 (×2): 20 mg
  Filled 2021-03-13 (×2): qty 2.5

## 2021-03-13 NOTE — Progress Notes (Signed)
PROGRESS NOTE    Travis Conrad  CZY:[id number] DOB: 1950-08-10 DOA: 02/19/2021 PCP: [name], MD    Brief Narrative:  Travis Conrad was admitted to the hospital with the working diagnosis of severe sepsis due to perforated diverticulitis/ intra-abdominal abscess. Now sp lapartomy, with colectomy, complicated with postoperative ileus.   70 year old male past medical history for atrial fibrillation, dyslipidemia, hypertension and diverticulosis who presented with nausea, vomiting and abdominal pain.  Reported several days of worse abdominal pain, and poor oral intake.  Because of severe symptoms he presented to the hospital.  On his initial physical examination his blood pressure was 84/65, heart rate 57, respiratory rate 20, oxygen saturation 91%, he was ill-looking appearing, heart S1-S2, present, irregular irregular, lungs clear to auscultation, abdomen tender to palpation in the mid abdomen, no lower extremity edema.   CT of the abdomen and pelvis with acute sigmoid diverticulitis with small extraluminal gas and fluid collection in the sigmoid mesocolon, 3.5 x 1.5 x 2.7 cm consistent with perforation and abscess.  Dilated proximal and decompressed distal small bowel loops, possible ileus.   9/4 developed atrial fibrillation with rapid ventricular response, required intravenous amiodarone infusion.  Follow-up CT 9/7 with increased size pelvic abscess. On 9/8 patient underwent CT-guided drainage of pelvic abscess.  09/10 Patient underwent exploratory laparotomy with sigmoid colectomy and diverting colostomy placement.  Postoperative ileus.   Urology and ID have been consulted for epididymitis.   9/19 Postoperative anemia: Required 1 unit packed red blood cells.    Positive left knee pain, positive effusion, underwent left knee aspiration and injection with good toleration.  Fluid with no crystals, gram stain no organisms.   Slowly resolving ileus. Continue to have vomiting, NG tube in  place.    Assessment & Plan:   Principal Problem:   Diverticulitis of large intestine with perforation and abscess without bleeding Active Problems:   Diverticulitis of intestine with abscess   Atrial fibrillation, chronic (HCC)   Essential hypertension   Diverticulitis   AKI (acute kidney injury) ([place])   Severe sepsis with acute organ dysfunction (HCC)   Intra-abdominal abscess (HCC)   Epididymitis   Severe sepsis due to perforated diverticulitis, sp colectomy. Left psoas collection. Post operative ileus.  Continue to have stool at the ostomy bag, NG tube in place to low intermittent suction. Documented output 300 cc.    On TPN for nutrition plus clear liquids.  Antibiotic therapy with IV zosyn, stop on 10.17.22 Has a PICC line in place.  Continue with metoclopramide q 6 hrs    Follow with surgical recommendations for possible clamping tube in am. If persistent vomiting may need abdominal CT for further workup.    2. Post operative anemia. sp 1 unit PRBC  Follow hgb today is 8,1 and hct 25.3    3. Paroxysmal atrial fibrillation/ HTN. On amiodarone and metoprolol with good rate control.    Enoxaparin for anticoagulation, plan to change to DOAC when able to tolerate po.    Blood pressure control with amlodipine   4. Right epididymitis resolved, completed therapy with doxycycline.    5. AKI with hypokalemia/ hyperkalemia,hyponatremia and hypophosphatemia.  TPN for nutrition. Renal  function with serum cr at 0,85, K is 3,6 and serum bicarbonate at 24.    6. Alcohol withdrawal. No clinical signs of withdrawal.    7. Left knee arthritis. Clinically improved after steroid injection.  Pain is well controlled.   Status is: Inpatient  Remains inpatient appropriate because:Inpatient level of care appropriate  due to severity of illness  Dispo: The patient is from: Home              Anticipated d/c is to: Home              Patient currently is not medically stable to  d/c.   Difficult to place patient No   DVT prophylaxis: Enoxaparin   Code Status:    full  Family Communication:  I spoke with patient's wife at the bedside, we talked in detail about patient's condition, plan of care and prognosis and all questions were addressed.      Nutrition Status: Nutrition Problem: Inadequate oral intake Etiology: acute illness (diverticulitis) Signs/Symptoms: per patient/family report, NPO status Interventions: TPN      Consultants:  Surgery  ID  Procedures:  Patient underwent exploratory laparotomy with sigmoid colectomy and diverting colostomy placement.   Antimicrobials:  Zosyn     Subjective: Patient with abdominal distention and intermittent colicky pain, no nausea or vomiting, NG tube in place and connected to suction, no dyspnea or chest pain, no knee pain.   Objective: Vitals:   03/12/21 1553 03/12/21 2027 03/13/21 0412 03/13/21 0833  BP: (!) 106/59 113/60 114/67 (!) 111/50  Pulse: 65 72 98 74  Resp: 18 15 16    Temp: 97.6 F (36.4 C)  98.2 F (36.8 C)   TempSrc: Oral  Axillary   SpO2: 96% 98% 97%   Weight:      Height:        Intake/Output Summary (Last 24 hours) at 03/13/2021 1344 Last data filed at 03/13/2021 1300 Gross per 24 hour  Intake 2496.1 ml  Output 1140 ml  Net 1356.1 ml   Filed Weights   03/06/21 0348 03/07/21 0342 03/10/21 0447  Weight: 85.6 kg 94 kg 102 kg    Examination:   General: Not in pain or dyspnea, deconditioned  Neurology: Awake and alert, non focal  E ENT: mild pallor, no icterus, oral mucosa moist Cardiovascular: No JVD. S1-S2 present, rhythmic, no gallops, rubs, or murmurs. No lower extremity edema. Pulmonary: vesicular breath sounds bilaterally, adequate air movement, no wheezing, rhonchi or rales. Gastrointestinal. Abdomen soft, mild distended but not tender., colostomy bag with stool  Skin. No rashes Musculoskeletal: no joint deformities     Data Reviewed: I have personally reviewed  following labs and imaging studies  CBC: Recent Labs  Lab 03/08/21 0808 03/09/21 0011 03/10/21 0512 03/11/21 0159 03/13/21 0209  WBC 15.3* 13.5* 14.3* 11.9* 7.7  HGB 9.5* 8.0* 8.4* 7.9* 8.1*  HCT 28.0* 23.6* 24.9* 23.2* 25.3*  MCV 100.4* 100.4* 101.2* 101.3* 103.3*  PLT 425* 347 413* 415* 643*   Basic Metabolic Panel: Recent Labs  Lab 03/07/21 0412 03/08/21 0808 03/09/21 0011 03/10/21 0512 03/12/21 0407  NA 130* 132* 132* 132* 135  K 4.6 4.6 5.2* 4.7 3.6  CL 100 102 103 102 105  CO2 22 22 23 24 24   GLUCOSE 137* 137* 182* 129* 120*  BUN 27* 26* 24* 25* 23  CREATININE 1.05 0.96 0.88 0.90 0.85  CALCIUM 8.0* 8.5* 8.1* 8.6* 8.0*  MG 2.1  --  2.1 2.1  --   PHOS 4.3  --  3.9 4.3 3.4   GFR: Estimated Creatinine Clearance: 100 mL/min (by C-G formula based on SCr of 0.85 mg/dL). Liver Function Tests: Recent Labs  Lab 03/07/21 0412 03/08/21 0808 03/10/21 0512  AST 13* 16 20  ALT 13 13 20   ALKPHOS 78 104 117  BILITOT 0.6 1.0 1.1  PROT 4.7* 5.9* 5.5*  ALBUMIN 1.3* 1.7* 1.7*   No results for input(s): LIPASE, AMYLASE in the last 168 hours. No results for input(s): AMMONIA in the last 168 hours. Coagulation Profile: No results for input(s): INR, PROTIME in the last 168 hours. Cardiac Enzymes: No results for input(s): CKTOTAL, CKMB, CKMBINDEX, TROPONINI in the last 168 hours. BNP (last 3 results) No results for input(s): PROBNP in the last 8760 hours. HbA1C: No results for input(s): HGBA1C in the last 72 hours. CBG: Recent Labs  Lab 03/12/21 2024 03/12/21 2337 03/13/21 0411 03/13/21 0743 03/13/21 1132  GLUCAP 102* 143* 134* 160* 138*   Lipid Profile: No results for input(s): CHOL, HDL, LDLCALC, TRIG, CHOLHDL, LDLDIRECT in the last 72 hours. Thyroid Function Tests: No results for input(s): TSH, T4TOTAL, FREET4, T3FREE, THYROIDAB in the last 72 hours. Anemia Panel: No results for input(s): VITAMINB12, FOLATE, FERRITIN, TIBC, IRON, RETICCTPCT in the last 72  hours.    Radiology Studies: I have reviewed all of the imaging during this hospital visit personally     Scheduled Meds:  amiodarone  200 mg Per NG tube BID   amLODipine  10 mg Per Tube Daily   Chlorhexidine Gluconate Cloth  6 each Topical Daily   enoxaparin (LOVENOX) injection  100 mg Subcutaneous Q12H   insulin aspart  0-15 Units Subcutaneous Q4H   lidocaine  1 patch Transdermal Q24H   melatonin  3 mg Per Tube QHS   metoCLOPramide (REGLAN) injection  10 mg Intravenous Q6H   metoprolol tartrate  100 mg Per Tube BID   nicotine  21 mg Transdermal Daily   pantoprazole (PROTONIX) IV  40 mg Intravenous Q24H   sodium chloride flush  10 mL Intracatheter Q12H   Continuous Infusions:  sodium chloride Stopped (03/05/21 0320)   piperacillin-tazobactam (ZOSYN)  IV 3.375 g (03/13/21 0854)   TPN ADULT (ION) 85 mL/hr at 03/12/21 1858   TPN ADULT (ION)       LOS: 21 days        Avenell Sellers Gerome Apley, MD

## 2021-03-13 NOTE — Progress Notes (Signed)
15 Days Post-Op  Subjective: CC: Patient with increased left sided abdominal pain yesterday that is now mild to almost resolved this am. He denies any nausea. Continues to have ngt output. Afebrile overnight. Voiding.   Objective: Vital signs in last 24 hours: Temp:  [97.6 F (36.4 C)-98.2 F (36.8 C)] 98.2 F (36.8 C) (09/25 0412) Pulse Rate:  [65-98] 74 (09/25 0833) Resp:  [15-18] 16 (09/25 0412) BP: (106-114)/(50-67) 111/50 (09/25 0833) SpO2:  [96 %-98 %] 97 % (09/25 0412) Last BM Date: 03/12/21  Intake/Output from previous day: 09/24 0701 - 09/25 0700 In: 2696.1 [I.V.:2163.9; NG/GT:200; IV Piggyback:332.2] Out: 1160 [Urine:700; Emesis/NG output:300; Drains:160] Intake/Output this shift: No intake/output data recorded.  PE: Gen:  Alert, NAD, pleasant Pulm:  Normal rate and effort. Abd: Soft, mild upper abdominal distension, left sided tenderness without peritonitis.  Drain w/ SS output. Stoma appears pink and viable. Air and stool in colostomy bag. NGT w/ bilious/thick brown output. Vac to midline incision with good seal.  Msk: SCD's in place Skin: no rashes noted, warm and dry    Lab Results:  Recent Labs    03/11/21 0159 03/13/21 0209  WBC 11.9* 7.7  HGB 7.9* 8.1*  HCT 23.2* 25.3*  PLT 415* 406*   BMET Recent Labs    03/12/21 0407  NA 135  K 3.6  CL 105  CO2 24  GLUCOSE 120*  BUN 23  CREATININE 0.85  CALCIUM 8.0*   PT/INR No results for input(s): LABPROT, INR in the last 72 hours. CMP     Component Value Date/Time   NA 135 03/12/2021 0407   NA 134 09/30/2019 0959   NA 142 03/03/2016 0921   K 3.6 03/12/2021 0407   K 4.3 03/03/2016 0921   CL 105 03/12/2021 0407   CO2 24 03/12/2021 0407   CO2 25 03/03/2016 0921   GLUCOSE 120 (H) 03/12/2021 0407   GLUCOSE 89 03/03/2016 0921   BUN 23 03/12/2021 0407   BUN 11 09/30/2019 0959   BUN 13.6 03/03/2016 0921   CREATININE 0.85 03/12/2021 0407   CREATININE 1.2 03/03/2016 0921   CALCIUM 8.0 (L)  03/12/2021 0407   CALCIUM 9.6 03/03/2016 0921   PROT 5.5 (L) 03/10/2021 0512   PROT 7.3 03/03/2016 0921   ALBUMIN 1.7 (L) 03/10/2021 0512   ALBUMIN 3.8 03/03/2016 0921   AST 20 03/10/2021 0512   AST 36 (H) 03/03/2016 0921   ALT 20 03/10/2021 0512   ALT 31 03/03/2016 0921   ALKPHOS 117 03/10/2021 0512   ALKPHOS 116 03/03/2016 0921   BILITOT 1.1 03/10/2021 0512   BILITOT 0.71 03/03/2016 0921   GFRNONAA >60 03/12/2021 0407   GFRNONAA 67 07/25/2013 1712   GFRAA >60 10/03/2019 0930   GFRAA 77 07/25/2013 1712   Lipase     Component Value Date/Time   LIPASE 22 02/19/2021 1653    Studies/Results: DG Abd Portable 1V  Result Date: 03/11/2021 CLINICAL DATA:  NG tube placement.  Ileus. EXAM: PORTABLE ABDOMEN - 1 VIEW COMPARISON:  Radiograph 03/05/2021 FINDINGS: Tip and side port of the enteric tube are below the diaphragm, the enteric tube is looped in the stomach. Gaseous distention of bowel loops in the central abdomen appears similar to prior exam. Small amount of formed stool in the included colon. IMPRESSION: Tip and side port of the enteric tube below the diaphragm, enteric tube looped in the stomach. Electronically Signed   By: Keith Rake M.D.   On: 03/11/2021 18:40  Anti-infectives: Anti-infectives (From admission, onward)    Start     Dose/Rate Route Frequency Ordered Stop   03/09/21 0800  piperacillin-tazobactam (ZOSYN) IVPB 3.375 g        3.375 g 12.5 mL/hr over 240 Minutes Intravenous Every 8 hours 03/09/21 0706 04/04/21 0759   02/26/21 0100  doxycycline (VIBRAMYCIN) 100 mg in sodium chloride 0.9 % 250 mL IVPB        100 mg 125 mL/hr over 120 Minutes Intravenous 2 times daily 02/26/21 0005 03/08/21 2359   02/20/21 0300  piperacillin-tazobactam (ZOSYN) IVPB 3.375 g        3.375 g 12.5 mL/hr over 240 Minutes Intravenous Every 8 hours 02/19/21 1907 03/08/21 2359   02/19/21 1900  ceFEPIme (MAXIPIME) 2 g in sodium chloride 0.9 % 100 mL IVPB  Status:  Discontinued         2 g 200 mL/hr over 30 Minutes Intravenous  Once 02/19/21 1857 02/19/21 1906   02/19/21 1900  metroNIDAZOLE (FLAGYL) IVPB 500 mg  Status:  Discontinued        500 mg 100 mL/hr over 60 Minutes Intravenous  Once 02/19/21 1857 02/19/21 1906   02/19/21 1815  piperacillin-tazobactam (ZOSYN) IVPB 3.375 g        3.375 g 100 mL/hr over 30 Minutes Intravenous  Once 02/19/21 1809 02/19/21 1944        Assessment/Plan POD 15 s/p ex lap, sigmoid colectomy, end colostomy with enterorrhaphy and drainage of intraabdominal abscess - Dr. Kieth Brightly and Dr. Radene Knee 9/10 for Acute sigmoid diverticulitis with contained perforation and abscess - CT 9/16 demonstrates heterogenous left iliopsoas collection likely hematoma, stranding in the retroperitoneum and subcu tissue.  Distal colon appears decompressed and very small caliber but there is contrast entering this area; there is additionally a similarly small-caliber segment of colon in the proximal transverse and the ascending colon is not dilated- suspect over all this represents small bowel/gastric ileus.  IR does not recommend draining the hematoma at this point. ID following and recommending Zosyn through 10/174 w/ repeat CT in 2 weeks - WBC normalized. Consider CT early this week if does not start opening up again.  - Air and stool in colostomy bag but failed clamping trial 9/23. Keep on LIWS today and consider repeat clamping trial tomorrow.  - Was started on reglan last weekend. I discussed with him the risk of tardive dyskinesia/EPS on 9/20. Patient stated understanding and is okay with continuing Reglan.  - Cont drain  - Wound vac MWF - WOCN following for new ostomy teaching and vac changes - Cont TPN - Encouraged ambulation (working with therapies), use IS, multimodal pain control  - PT/OT - Cont abx per ID recs   FEN - NGT, IVF, TPN.  VTE - SCDs, therapeutic lovenox  ID - Zosyn 9/4 - 10/17 (per ID). WBC (13.8>12.7>15.3>13.5>14.3>11.9). Afebrile. Foley  - out, voiding.    Per primary: A fib - cards following  AKI - resolved ABL anemia -  stable HTN ETOH use L knee pain - per ortho   LOS: 21 days    Jillyn Ledger , [facility] 03/13/2021, 9:04 AM Please see Amion for pager number during day hours 7:00am-4:30pm

## 2021-03-13 NOTE — Progress Notes (Signed)
PHARMACY - TOTAL PARENTERAL NUTRITION CONSULT NOTE  Indication: Prolonged ileus  Patient Measurements: Height: 6' (182.9 cm) Weight: 102 kg (224 lb 13.9 oz) IBW/kg (Calculated) : 77.6 TPN AdjBW (KG): 82.6 Body mass index is 30.5 kg/m.  Assessment:  70 YOM with lower abdominal pain/N/V found to have perforated diverticulitis. CT abd on 9/3 found to have active ileus/pSBO from diverticulitis/bowel inflammation. Pharmacy consulted to manage TPN for prolonged ileus. Of note patient has a history pf alcohol dependency and is at high risk for refeeding.   Glucose / Insulin: no hx DM, A1c 5.9% - CBGs < 180.  Used 9 units SSI in past 24 hrs, 15 units insulin in TPN Electrolytes: 9/24 labs - Na up to 135 (max in TPN), K 3.6 and 3 runs given (goal >/= 4 with ileus), others WNL Renal: SCr < 1 stable, BUN WNL Hepatic: LFTs / tbili / TG WNL, albumin 1.7 Intake / Output; MIVF: NG O/P 333mL, drain O/P 175mL, none from colostomy, UOP 0.3 ml/kg/hr, LBM 9/6, vomited x2 9/23 when NGT clamped GI Meds: IV Reglan/6hr, PPI IV/24h ID: Zosyn through 18/84/16 for complicated diverticulitis with IA abscess GI Imaging:  - 9/3 CT abd: Active ileus/PSBO - 9/16 CT abd: L-iliopsoas fluid collection (abscess or hematoma), resolution of prior diverticular abscess, still w/ evidence of ileus GI Surgeries / Procedures:  - 9/10 ex-lap, sigmoid colectomy w/ end colostomy creation, enterorrhaphy, drainage of intra-abd abscess, wound vac placement  Central access: PICC line 02/24/21 TPN start date: 02/25/21  Nutritional Goals, RD Estimated Needs Total Energy Estimated Needs: 2000-2200 Total Protein Estimated Needs: 100-110 grams Total Fluid Estimated Needs: >2L  Current Nutrition:  TPN CLD started 9/23  Plan:  Continue TPN at goal rate 85 ml/hr to provide 102g AA, 367g CHO and 45g ILE for a total of 2104 kCal, meeting 100% of patient's needs Electrolytes in TPN: Na 170mEq/L, increase K to 55mEq/L on 9/24 (was high on  52mEq/L), Ca 2.8mEq/L, Mg 13mEq/L, Phos 9mmol/L, Cl:Ac 1:1 - no change today 9/25 Add standard MVI, trace elements, folate and thiamine to TPN Continue moderate SSI Q4H and 15 units regular insulin in TPN  Standard TPN labs on Mon and Thurs.  Watch CBGs.  May repeat clamping trial again 9/25  Camille Dragan D. Mina Marble, PharmD, BCPS, [place] 03/13/2021, 7:09 AM

## 2021-03-14 DIAGNOSIS — K5792 Diverticulitis of intestine, part unspecified, without perforation or abscess without bleeding: Secondary | ICD-10-CM | POA: Diagnosis not present

## 2021-03-14 DIAGNOSIS — K5720 Diverticulitis of large intestine with perforation and abscess without bleeding: Secondary | ICD-10-CM | POA: Diagnosis not present

## 2021-03-14 DIAGNOSIS — I482 Chronic atrial fibrillation, unspecified: Secondary | ICD-10-CM | POA: Diagnosis not present

## 2021-03-14 DIAGNOSIS — N179 Acute kidney failure, unspecified: Secondary | ICD-10-CM | POA: Diagnosis not present

## 2021-03-14 LAB — CBC
HCT: 26.3 % — ABNORMAL LOW (ref 39.0–52.0)
Hemoglobin: 8.8 g/dL — ABNORMAL LOW (ref 13.0–17.0)
MCH: 34 pg (ref 26.0–34.0)
MCHC: 33.5 g/dL (ref 30.0–36.0)
MCV: 101.5 fL — ABNORMAL HIGH (ref 80.0–100.0)
Platelets: 383 10*3/uL (ref 150–400)
RBC: 2.59 MIL/uL — ABNORMAL LOW (ref 4.22–5.81)
RDW: 14.3 % (ref 11.5–15.5)
WBC: 7.4 10*3/uL (ref 4.0–10.5)
nRBC: 0 % (ref 0.0–0.2)

## 2021-03-14 LAB — GLUCOSE, CAPILLARY
Glucose-Capillary: 120 mg/dL — ABNORMAL HIGH (ref 70–99)
Glucose-Capillary: 122 mg/dL — ABNORMAL HIGH (ref 70–99)
Glucose-Capillary: 123 mg/dL — ABNORMAL HIGH (ref 70–99)
Glucose-Capillary: 123 mg/dL — ABNORMAL HIGH (ref 70–99)
Glucose-Capillary: 136 mg/dL — ABNORMAL HIGH (ref 70–99)
Glucose-Capillary: 144 mg/dL — ABNORMAL HIGH (ref 70–99)
Glucose-Capillary: 153 mg/dL — ABNORMAL HIGH (ref 70–99)

## 2021-03-14 LAB — MAGNESIUM: Magnesium: 2.1 mg/dL (ref 1.7–2.4)

## 2021-03-14 LAB — COMPREHENSIVE METABOLIC PANEL
ALT: 18 U/L (ref 0–44)
AST: 16 U/L (ref 15–41)
Albumin: 1.6 g/dL — ABNORMAL LOW (ref 3.5–5.0)
Alkaline Phosphatase: 84 U/L (ref 38–126)
Anion gap: 6 (ref 5–15)
BUN: 22 mg/dL (ref 8–23)
CO2: 26 mmol/L (ref 22–32)
Calcium: 8 mg/dL — ABNORMAL LOW (ref 8.9–10.3)
Chloride: 105 mmol/L (ref 98–111)
Creatinine, Ser: 0.88 mg/dL (ref 0.61–1.24)
GFR, Estimated: >60 mL/min (ref >60)
Glucose, Bld: 148 mg/dL — ABNORMAL HIGH (ref 70–99)
Potassium: 4 mmol/L (ref 3.5–5.1)
Sodium: 137 mmol/L (ref 135–145)
Total Bilirubin: 0.6 mg/dL (ref 0.3–1.2)
Total Protein: 5.3 g/dL — ABNORMAL LOW (ref 6.5–8.1)

## 2021-03-14 LAB — PHOSPHORUS: Phosphorus: 3.3 mg/dL (ref 2.5–4.6)

## 2021-03-14 LAB — TRIGLYCERIDES: Triglycerides: 68 mg/dL (ref <150)

## 2021-03-14 MED ORDER — PANTOPRAZOLE SODIUM 40 MG PO TBEC
40.0000 mg | DELAYED_RELEASE_TABLET | Freq: Two times a day (BID) | ORAL | Status: DC
  Administered 2021-03-14 – 2021-03-15 (×2): 40 mg via ORAL
  Filled 2021-03-14 (×3): qty 1

## 2021-03-14 MED ORDER — PROSOURCE PLUS PO LIQD
30.0000 mL | Freq: Two times a day (BID) | ORAL | Status: DC
  Filled 2021-03-14: qty 30

## 2021-03-14 MED ORDER — SUCRALFATE 1 GM/10ML PO SUSP
1.0000 g | Freq: Three times a day (TID) | ORAL | Status: DC
  Administered 2021-03-14 – 2021-03-15 (×3): 1 g via ORAL
  Filled 2021-03-14 (×4): qty 10

## 2021-03-14 MED ORDER — TRAVASOL 10 % IV SOLN
INTRAVENOUS | Status: DC
  Filled 2021-03-14: qty 1020

## 2021-03-14 MED ORDER — BOOST / RESOURCE BREEZE PO LIQD CUSTOM
1.0000 | Freq: Three times a day (TID) | ORAL | Status: DC
  Administered 2021-03-14 – 2021-03-16 (×4): 1 via ORAL

## 2021-03-14 MED ORDER — TRAVASOL 10 % IV SOLN
INTRAVENOUS | Status: AC
  Filled 2021-03-14: qty 540

## 2021-03-14 NOTE — Progress Notes (Addendum)
PROGRESS NOTE    Travis Conrad  YHC:[id number] DOB: 09/24/1950 DOA: 02/19/2021 PCP: [name], MD    Brief Narrative:  Travis Conrad was admitted to the hospital with the working diagnosis of severe sepsis due to perforated diverticulitis/ intra-abdominal abscess. Now sp lapartomy, with colectomy, complicated with postoperative ileus.   70 year old male past medical history for atrial fibrillation, dyslipidemia, hypertension and diverticulosis who presented with nausea, vomiting and abdominal pain.  Reported several days of worse abdominal pain, and poor oral intake.  Because of severe symptoms he presented to the hospital.  On his initial physical examination his blood pressure was 84/65, heart rate 57, respiratory rate 20, oxygen saturation 91%, he was ill-looking appearing, heart S1-S2, present, irregular irregular, lungs clear to auscultation, abdomen tender to palpation in the mid abdomen, no lower extremity edema.   CT of the abdomen and pelvis with acute sigmoid diverticulitis with small extraluminal gas and fluid collection in the sigmoid mesocolon, 3.5 x 1.5 x 2.7 cm consistent with perforation and abscess.  Dilated proximal and decompressed distal small bowel loops, possible ileus.   9/4 developed atrial fibrillation with rapid ventricular response, required intravenous amiodarone infusion.   Follow-up CT 9/7 with increased size pelvic abscess. On 9/8 patient underwent CT-guided drainage of pelvic abscess.   09/10 Patient underwent exploratory laparotomy with sigmoid colectomy and diverting colostomy placement.  Postoperative ileus.   Urology and ID have been consulted for epididymitis.   9/19 Postoperative anemia: Required 1 unit packed red blood cells.    Positive left knee pain, positive effusion, underwent left knee aspiration and injection with good toleration.  Fluid with no crystals, gram stain no organisms.   Slowly resolving ileus. Patient had NG tube clamped but  patient vomiting, needing to reconnect tube to suction.     09/26 NG tube has been removed.  Assessment & Plan:   Principal Problem:   Diverticulitis of large intestine with perforation and abscess without bleeding Active Problems:   Diverticulitis of intestine with abscess   Atrial fibrillation, chronic (HCC)   Essential hypertension   Diverticulitis   AKI (acute kidney injury) ([place])   Severe sepsis with acute organ dysfunction (HCC)   Intra-abdominal abscess (HCC)   Epididymitis   Severe sepsis due to perforated diverticulitis, sp colectomy. Left psoas collection. Post operative ileus.  His abdomen is soft and positive stool in the colostomy bag. NG tube has been removed.  Patient on clear liquid diet.    Continue with metoclopramide q 6 hrs and and as needed zofran. Start wean off TPN to 50% with plan to discontinue tomorrow.  Add sucralfate and change famotidine to pantoprazole bid 40 mg.  Wound vac in place.    2. Post operative anemia. sp 1 unit PRBC  Hgb continue to be stable at 8,8 and hct at 26.3   3. Paroxysmal atrial fibrillation/ HTN. Continue with amiodarone and metoprolol with good rate control.   Continue with enoxaparin for anticoagulation, plan to change to DOAC when po intake more consistent. On amlodipine for blood pressure control.   4. Right epididymitis resolved, completed therapy with doxycycline.    5. AKI with hypokalemia/ hyperkalemia,hyponatremia and hypophosphatemia.  Stable renal function and electrolytes. Wean off TPN in the intention to discontinue in am.    6. Alcohol withdrawal. No acute clinical signs of withdrawal.    7. Left knee arthritis. Clinically improved after steroid injection.  Pain is well controlled.   Status is: Inpatient  Remains inpatient appropriate because:Inpatient level  of care appropriate due to severity of illness  Dispo: The patient is from: Home              Anticipated d/c is to: Home              Patient  currently is not medically stable to d/c.   Difficult to place patient No  DVT prophylaxis: Enoxaparin   Code Status:    full  Family Communication:  I spoke with patient's wife at the bedside, we talked in detail about patient's condition, plan of care and prognosis and all questions were addressed.  Consultants:  Surgery ID   Procedures:  Colectomy and colostomy   Antimicrobials:  Zosyn     Subjective: Patient had NG tube removed, no vomiting, continue to have intermittent abdominal pain, burning sensation after eating, no chest pain. He has been out of bed with physical therapy.   Objective: Vitals:   03/13/21 0833 03/13/21 1412 03/13/21 2007 03/14/21 0526  BP: (!) 111/50 116/76 127/66 115/68  Pulse: 74 75 85 71  Resp:  18 17 18   Temp:  97.6 F (36.4 C) 98.2 F (36.8 C) 98 F (36.7 C)  TempSrc:  Oral    SpO2:  97% 97% 96%  Weight:      Height:        Intake/Output Summary (Last 24 hours) at 03/14/2021 1221 Last data filed at 03/14/2021 1000 Gross per 24 hour  Intake 796.9 ml  Output 590 ml  Net 206.9 ml   Filed Weights   03/06/21 0348 03/07/21 0342 03/10/21 0447  Weight: 85.6 kg 94 kg 102 kg    Examination:   General: Not in pain or dyspnea, deconditioned  Neurology: Awake and alert, non focal  E ENT: mld pallor, no icterus, oral mucosa moist Cardiovascular: No JVD. S1-S2 present, rhythmic, no gallops, rubs, or murmurs. No lower extremity edema. Pulmonary: positive breath sounds bilaterally, adequate air movement, no wheezing, rhonchi or rales. Gastrointestinal. Abdomen mild distended but not tender, continue to be soft, wound vac in place, colostomy bag in place Skin. No rashes Musculoskeletal: no joint deformities     Data Reviewed: I have personally reviewed following labs and imaging studies  CBC: Recent Labs  Lab 03/09/21 0011 03/10/21 0512 03/11/21 0159 03/13/21 0209 03/14/21 0636  WBC 13.5* 14.3* 11.9* 7.7 7.4  HGB 8.0* 8.4* 7.9* 8.1*  8.8*  HCT 23.6* 24.9* 23.2* 25.3* 26.3*  MCV 100.4* 101.2* 101.3* 103.3* 101.5*  PLT 347 413* 415* 406* 892   Basic Metabolic Panel: Recent Labs  Lab 03/08/21 0808 03/09/21 0011 03/10/21 0512 03/12/21 0407 03/14/21 0636  NA 132* 132* 132* 135 137  K 4.6 5.2* 4.7 3.6 4.0  CL 102 103 102 105 105  CO2 22 23 24 24 26   GLUCOSE 137* 182* 129* 120* 148*  BUN 26* 24* 25* 23 22  CREATININE 0.96 0.88 0.90 0.85 0.88  CALCIUM 8.5* 8.1* 8.6* 8.0* 8.0*  MG  --  2.1 2.1  --  2.1  PHOS  --  3.9 4.3 3.4 3.3   GFR: Estimated Creatinine Clearance: 96.6 mL/min (by C-G formula based on SCr of 0.88 mg/dL). Liver Function Tests: Recent Labs  Lab 03/08/21 0808 03/10/21 0512 03/14/21 0636  AST 16 20 16   ALT 13 20 18   ALKPHOS 104 117 84  BILITOT 1.0 1.1 0.6  PROT 5.9* 5.5* 5.3*  ALBUMIN 1.7* 1.7* 1.6*   No results for input(s): LIPASE, AMYLASE in the last 168 hours. No results  for input(s): AMMONIA in the last 168 hours. Coagulation Profile: No results for input(s): INR, PROTIME in the last 168 hours. Cardiac Enzymes: No results for input(s): CKTOTAL, CKMB, CKMBINDEX, TROPONINI in the last 168 hours. BNP (last 3 results) No results for input(s): PROBNP in the last 8760 hours. HbA1C: No results for input(s): HGBA1C in the last 72 hours. CBG: Recent Labs  Lab 03/13/21 1955 03/14/21 0033 03/14/21 0447 03/14/21 0731 03/14/21 1122  GLUCAP 105* 153* 136* 144* 122*   Lipid Profile: Recent Labs    03/14/21 0636  TRIG 68   Thyroid Function Tests: No results for input(s): TSH, T4TOTAL, FREET4, T3FREE, THYROIDAB in the last 72 hours. Anemia Panel: No results for input(s): VITAMINB12, FOLATE, FERRITIN, TIBC, IRON, RETICCTPCT in the last 72 hours.    Radiology Studies: I have reviewed all of the imaging during this hospital visit personally     Scheduled Meds:  amiodarone  200 mg Per NG tube BID   amLODipine  10 mg Per Tube Daily   Chlorhexidine Gluconate Cloth  6 each  Topical Daily   enoxaparin (LOVENOX) injection  100 mg Subcutaneous Q12H   famotidine  20 mg Per Tube Daily   feeding supplement  1 Container Oral TID BM   insulin aspart  0-15 Units Subcutaneous Q4H   lidocaine  1 patch Transdermal Q24H   melatonin  3 mg Per Tube QHS   metoCLOPramide (REGLAN) injection  10 mg Intravenous Q6H   metoprolol tartrate  100 mg Per Tube BID   nicotine  21 mg Transdermal Daily   sodium chloride flush  10 mL Intracatheter Q12H   Continuous Infusions:  sodium chloride Stopped (03/05/21 0320)   piperacillin-tazobactam (ZOSYN)  IV 3.375 g (03/14/21 0820)   TPN ADULT (ION) 85 mL/hr at 03/13/21 1937   TPN ADULT (ION)       LOS: 22 days        Porchea Charrier Gerome Apley, MD

## 2021-03-14 NOTE — Progress Notes (Signed)
Progress Note  16 Days Post-Op  Subjective: Patient denies nausea this AM. Still having some abdominal pain. He is not aware if he is having colostomy output but wife present at bedside and reports he is. He has been getting up some with PT but did not yesterday.   Objective: Vital signs in last 24 hours: Temp:  [97.6 F (36.4 C)-98.2 F (36.8 C)] 98 F (36.7 C) (09/26 0526) Pulse Rate:  [71-85] 71 (09/26 0526) Resp:  [17-18] 18 (09/26 0526) BP: (115-127)/(66-76) 115/68 (09/26 0526) SpO2:  [96 %-97 %] 96 % (09/26 0526) Last BM Date: 03/12/21  Intake/Output from previous day: 09/25 0701 - 09/26 0700 In: 796.9 [I.V.:796.9] Out: 480 [Urine:400; Drains:80] Intake/Output this shift: No intake/output data recorded.  PE: Gen:  Alert, NAD, pleasant Pulm:  Normal rate and effort. Abd: Soft, mild distention, left sided tenderness without peritonitis.  Drain w/ SS output. Stoma appears pink and viable. Air and stool in colostomy bag. NGT clamped this AM. +BS. Vac to midline incision with good seal.  Msk: SCD's in place Skin: no rashes noted, warm and dry   Lab Results:  Recent Labs    03/13/21 0209 03/14/21 0636  WBC 7.7 7.4  HGB 8.1* 8.8*  HCT 25.3* 26.3*  PLT 406* 383   BMET Recent Labs    03/12/21 0407 03/14/21 0636  NA 135 137  K 3.6 4.0  CL 105 105  CO2 24 26  GLUCOSE 120* 148*  BUN 23 22  CREATININE 0.85 0.88  CALCIUM 8.0* 8.0*   PT/INR No results for input(s): LABPROT, INR in the last 72 hours. CMP     Component Value Date/Time   NA 137 03/14/2021 0636   NA 134 09/30/2019 0959   NA 142 03/03/2016 0921   K 4.0 03/14/2021 0636   K 4.3 03/03/2016 0921   CL 105 03/14/2021 0636   CO2 26 03/14/2021 0636   CO2 25 03/03/2016 0921   GLUCOSE 148 (H) 03/14/2021 0636   GLUCOSE 89 03/03/2016 0921   BUN 22 03/14/2021 0636   BUN 11 09/30/2019 0959   BUN 13.6 03/03/2016 0921   CREATININE 0.88 03/14/2021 0636   CREATININE 1.2 03/03/2016 0921   CALCIUM 8.0  (L) 03/14/2021 0636   CALCIUM 9.6 03/03/2016 0921   PROT 5.3 (L) 03/14/2021 0636   PROT 7.3 03/03/2016 0921   ALBUMIN 1.6 (L) 03/14/2021 0636   ALBUMIN 3.8 03/03/2016 0921   AST 16 03/14/2021 0636   AST 36 (H) 03/03/2016 0921   ALT 18 03/14/2021 0636   ALT 31 03/03/2016 0921   ALKPHOS 84 03/14/2021 0636   ALKPHOS 116 03/03/2016 0921   BILITOT 0.6 03/14/2021 0636   BILITOT 0.71 03/03/2016 0921   GFRNONAA >60 03/14/2021 0636   GFRNONAA 67 07/25/2013 1712   GFRAA >60 10/03/2019 0930   GFRAA 77 07/25/2013 1712   Lipase     Component Value Date/Time   LIPASE 22 02/19/2021 1653       Studies/Results: No results found.  Anti-infectives: Anti-infectives (From admission, onward)    Start     Dose/Rate Route Frequency Ordered Stop   03/09/21 0800  piperacillin-tazobactam (ZOSYN) IVPB 3.375 g        3.375 g 12.5 mL/hr over 240 Minutes Intravenous Every 8 hours 03/09/21 0706 04/04/21 0759   02/26/21 0100  doxycycline (VIBRAMYCIN) 100 mg in sodium chloride 0.9 % 250 mL IVPB        100 mg 125 mL/hr over 120 Minutes Intravenous  2 times daily 02/26/21 0005 03/08/21 2359   02/20/21 0300  piperacillin-tazobactam (ZOSYN) IVPB 3.375 g        3.375 g 12.5 mL/hr over 240 Minutes Intravenous Every 8 hours 02/19/21 1907 03/08/21 2359   02/19/21 1900  ceFEPIme (MAXIPIME) 2 g in sodium chloride 0.9 % 100 mL IVPB  Status:  Discontinued        2 g 200 mL/hr over 30 Minutes Intravenous  Once 02/19/21 1857 02/19/21 1906   02/19/21 1900  metroNIDAZOLE (FLAGYL) IVPB 500 mg  Status:  Discontinued        500 mg 100 mL/hr over 60 Minutes Intravenous  Once 02/19/21 1857 02/19/21 1906   02/19/21 1815  piperacillin-tazobactam (ZOSYN) IVPB 3.375 g        3.375 g 100 mL/hr over 30 Minutes Intravenous  Once 02/19/21 1809 02/19/21 1944        Assessment/Plan POD 16 s/p ex lap, sigmoid colectomy, end colostomy with enterorrhaphy and drainage of intraabdominal abscess - Dr. Kieth Brightly and Dr. Radene Knee  9/10 for Acute sigmoid diverticulitis with contained perforation and abscess - CT 9/16 demonstrates heterogenous left iliopsoas collection likely hematoma, stranding in the retroperitoneum and subcu tissue.  Distal colon appears decompressed and very small caliber but there is contrast entering this area; there is additionally a similarly small-caliber segment of colon in the proximal transverse and the ascending colon is not dilated- suspect over all this represents small bowel/gastric ileus.  IR does not recommend draining the hematoma at this point. ID following and recommending Zosyn through 10/17 w/ repeat CT in 2 weeks - WBC normalized. Consider CT earlier than 2 weeks if struggling with bowel function  - Air and stool in colostomy bag intermittently had some nausea over the weekend but feels well this AM. Removed NGT, keep on clears - Was started on reglan last weekend. Discussed with him the risk of tardive dyskinesia/EPS on 9/20. Patient stated understanding and is okay with continuing Reglan.  - Cont drain  - Wound vac MWF - WOCN following for new ostomy teaching and vac changes - Cont TPN - Encouraged ambulation (working with therapies), use IS, multimodal pain control  - Cont abx per ID recs   FEN - CLD, IVF, TPN.  VTE - SCDs, therapeutic lovenox  ID - Zosyn 9/4 - 10/17 (per ID) Foley - out, voiding.    Per primary: A fib - cards following  AKI - resolved ABL anemia -  stable HTN ETOH use L knee pain - per ortho  LOS: 22 days    Norm Parcel, [facility] 03/14/2021, 9:37 AM Please see Amion for pager number during day hours 7:00am-4:30pm

## 2021-03-14 NOTE — Progress Notes (Addendum)
Physical Therapy Treatment Patient Details Name: Travis Conrad MRN: [id number] DOB: September 24, 1950 Today's Date: 03/14/2021   History of Present Illness 70 yo admitted 9/3 with abdominal pain, vomiting and ileus. 9/4 Afib with RVR. 9/8 intrabdominal abscess s/p IR drain placed. 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 Pt found to have lt iliopsoas hematoma on CT 9/16.  9/20 left knee aspiration. PMhx: HTN, HLD, Afib, ETOH use    PT Comments    Pt remains pleasant and willing to mobilize. Pt happy to have NGT out and increased neck ROM this session. Pt with gradual improvement in bed level mobility as well as gait and strength. Pt continues to have pain in [name] but improved hip flexion today. Continued education for progressive mobility.  HR 96 SpO2 96% on RA    Recommendations for follow up therapy are one component of a multi-disciplinary discharge planning process, led by the attending physician.  Recommendations may be updated based on patient status, additional functional criteria and insurance authorization.  Follow Up Recommendations  Supervision for mobility/OOB;Home health PT     Equipment Recommendations  None recommended by PT    Recommendations for Other Services       Precautions / Restrictions Precautions Precautions: Fall Precaution Comments: wound vAC, jp drain Restrictions Weight Bearing Restrictions: No     Mobility  Bed Mobility Overal bed mobility: Needs Assistance Bed Mobility: Rolling;Sidelying to Sit Rolling: Supervision Sidelying to sit: Supervision       General bed mobility comments: supervision for lines, reliant on rail to roll to left and rise from side. pt denied splinting abdomen with pillow this session    Transfers Overall transfer level: Needs assistance   Transfers: Sit to/from Stand Sit to Stand: Supervision         General transfer comment: cues for hand placement to rise and  to back fully to surface with descent  Ambulation/Gait Ambulation/Gait assistance: Min guard Gait Distance (Feet): 270 Feet Assistive device: Rolling walker (2 wheeled) Gait Pattern/deviations: Trunk flexed;Decreased stride length;Step-through pattern   Gait velocity interpretation: 1.31 - 2.62 ft/sec, indicative of limited community ambulator General Gait Details: cues for posture and position in RW with slowly increasing trunk extension with gait with increased gait distance. 2 standing rest breaks with cues for breathing   Stairs             Wheelchair Mobility    Modified Rankin (Stroke Patients Only)       Balance Overall balance assessment: Needs assistance   Sitting balance-Leahy Scale: Good Sitting balance - Comments: Able to maintain without UE support   Standing balance support: Bilateral upper extremity supported;During functional activity Standing balance-Leahy Scale: Poor Standing balance comment: Reliant on RW in standing                            Cognition Arousal/Alertness: Awake/alert Behavior During Therapy: WFL for tasks assessed/performed Overall Cognitive Status: Impaired/Different from baseline Area of Impairment: Memory                     Memory: Decreased short-term memory         General Comments: HOH      Exercises General Exercises - Lower Extremity Long Arc Quad: AROM;Both;Seated;20 reps Hip Flexion/Marching: AROM;Both;20 reps;Standing    General Comments        Pertinent Vitals/Pain Pain Score: 6  Pain Location: abdomen Pain Descriptors /  Indicators: Aching;Guarding Pain Intervention(s): Limited activity within patient's tolerance;Monitored during session;Premedicated before session;Repositioned    Home Living                      Prior Function            PT Goals (current goals can now be found in the care plan section) Acute Rehab PT Goals Time For Goal Achievement:  03/28/21 Potential to Achieve Goals: Good Progress towards PT goals: Goals met and updated - see care plan    Frequency    Min 3X/week      PT Plan Current plan remains appropriate    Co-evaluation              AM-PAC PT "6 Clicks" Mobility   Outcome Measure  Help needed turning from your back to your side while in a flat bed without using bedrails?: None Help needed moving from lying on your back to sitting on the side of a flat bed without using bedrails?: None Help needed moving to and from a bed to a chair (including a wheelchair)?: A Little Help needed standing up from a chair using your arms (e.g., wheelchair or bedside chair)?: A Little Help needed to walk in hospital room?: A Little Help needed climbing 3-5 steps with a railing? : A Lot 6 Click Score: 19    End of Session   Activity Tolerance: Patient tolerated treatment well Patient left: in chair;with call bell/phone within reach;with chair alarm set;with family/visitor present Nurse Communication: Mobility status PT Visit Diagnosis: Other abnormalities of gait and mobility (R26.89);Difficulty in walking, not elsewhere classified (R26.2)     Time: 0940-1006 PT Time Calculation (min) (ACUTE ONLY): 26 min  Charges:  $Gait Training: 8-22 mins $Therapeutic Exercise: 8-22 mins                     [place], PT Acute Rehabilitation Services Pager: [phone] Office: [place] 03/14/2021, 10:44 AM

## 2021-03-14 NOTE — Progress Notes (Signed)
ANTICOAGULATION CONSULT NOTE - Follow Up Consult  Pharmacy Consult for Lovenox Indication: atrial fibrillation  Allergies  Allergen Reactions   Atorvastatin Other (See Comments)    myalgias    Patient Measurements: Height: 6' (182.9 cm) Weight: 102 kg (224 lb 13.9 oz) IBW/kg (Calculated) : 77.6  Vital Signs: Temp: 98 F (36.7 C) (09/26 0526) BP: 115/68 (09/26 0526) Pulse Rate: 71 (09/26 0526)  Labs: Recent Labs    03/12/21 0407 03/13/21 0209 03/14/21 0636  HGB  --  8.1* 8.8*  HCT  --  25.3* 26.3*  PLT  --  406* 383  CREATININE 0.85  --  0.88     Estimated Creatinine Clearance: 96.6 mL/min (by C-G formula based on SCr of 0.88 mg/dL).   Assessment: 70 y/o male admitted for sepsis from contained perforated diverticulitis with active ileus. On PTA Xarelto for Afib with last dose on 9/2 at 2000. He was on a heparin drip previously which was held on 9/19. Pharmacy consulted for Lovenox.   9/16 CT with possible psoas hematoma 9/19 Hgb 6.7 - given 1 unit PRBC 9/22 Hgb stable 8s, platelets are normal 9/26 Hgb stable, pltc WNL  Goal of Therapy:  Anti-Xa level Goal:  0.6-1 units/mL 4 hours post dose Monitor platelets by anticoagulation protocol: Yes   Plan:  Lovenox 100 mg SQ q12h CBC at least q72h while on Lovenox F/U ability to transition to oral agent  Thank you for involving pharmacy in this patient's care. [place] Crane, Roylene Reason, BCCP Clinical Pharmacist  03/14/2021 10:51 AM   [facility] pharmacy phone numbers are listed on [email or web address]

## 2021-03-14 NOTE — Progress Notes (Signed)
Nutrition Follow-up  DOCUMENTATION CODES:  Not applicable  INTERVENTION:  -Continue TPN wean/management per Pharmacy -Boost Breeze po TID, each supplement provides 250 kcal and 9 grams of protein -PROSource PLUS PO 38mls BID, each supplement provides 100 kcals and 15 grams of protein  NUTRITION DIAGNOSIS:  Inadequate oral intake related to acute illness (diverticulitis) as evidenced by per patient/family report, NPO status. -- ongoing  GOAL:  Patient will meet greater than or equal to 90% of their needs -- addressing with TPN  MONITOR:  Supplement acceptance, PO intake, Diet advancement, Weight trends, Labs, I & O's  REASON FOR ASSESSMENT:  Consult New TPN/TNA  ASSESSMENT:  Pt with PMH significant for HTN, Afib, h/o diverticulitis, heavy EtOH use, and high cholesterol admitted with severe sepsis due to perforated diverticulitis/intra-abdominal abscess. Pt now s/p laparotomy w/ colectomy complicated by postoperative ileus.  9/04 - developed Afib w/ rapid ventricular response, required IV amiodarone infusion 9/07 - f/u CT w/ increased size of pelvic abscess 9/08 - s/p drainage catheter placement of pelvic abscess 9/09 - TPN initiated 9/10 - s/p ex lap, sigmoid colectomy, end colostomy creation (Hartmann's procedure) w/ enterorrhaphy and drainage of intra-abdominal abscess and woundVAC placement of 50cm area of abdomen 9/16 - CT revealed heterogenous left iliopsoas collection which may represent abscess or hematoma, stranding in the retroperitoneum and subcu tissue; per IR do no recommend aspiration or drainage at this time  9/19 - post-operative anemia requiring 1 unit PRBC   9/20 - s/p lt knee aspiration and injection due to lt knee effusions 9/23 - diet advanced to clear liquids 9/26 - NGT removed; TPN rate reduced to half  Per MD, ileus is slowly resolving. Abdomen is soft and stool noted in colostomy bag. Pt had trial of NG clamping but later vomited and required return to  suction. As of today, 9/26, NGT has been removed. Pt denies N/V since NGT removal, but c/o intermittent abd pain and a burning sensation after eating. Pt now on clear liquid diet with plans to receive TPN at 1/2 goal rate (3ml/hr) tonight to discontinue TPN tomorrow. RD to order oral nutrition supplements to aid in increasing calorie/protein intake.    PO Intake: 25% x 2 recorded meals (both recorded on 9/23)  UOP: 400 ml x24 hours Drain: 63ml x24 hours I/O: +18.6 L since admit  Current weight (last updated 9/22): 102 kg Admit weight: 79.4 kg   Medications: reglan, SSI Q4H, protonix, carafate, IV abx Labs reviewed. CBGs 122-153x 24 hours  Diet Order:   Diet Order             Diet clear liquid Room service appropriate? Yes with Assist; Fluid consistency: Thin  Diet effective now                  EDUCATION NEEDS:  No education needs have been identified at this time  Skin:  Skin Assessment: Skin Integrity Issues: Skin Integrity Issues:: Wound VAC Wound Vac: abdomen Incisions: closed incision to abdomen  Last BM:  9/25  Height:  Ht Readings from Last 1 Encounters:  02/26/21 6' (1.829 m)   Weight:  Wt Readings from Last 10 Encounters:  03/10/21 102 kg  06/28/20 79.4 kg  04/06/20 78.7 kg  01/05/20 78 kg  11/03/19 78.7 kg  10/03/19 79.4 kg  08/20/19 78.9 kg  07/22/19 79.1 kg  05/02/19 79.4 kg  04/02/18 80.3 kg   BMI:  Body mass index is 30.5 kg/m.  Estimated Nutritional Needs:  Kcal:  2000-2200 Protein:  100-110 grams Fluid:  >2L    Larkin Ina, MS, RD, LDN (she/her/hers) RD pager number and weekend/on-call pager number located in Veteran.

## 2021-03-14 NOTE — Progress Notes (Signed)
Maintaining sinus rhythm  [facility] will sign off.   Medication Recommendations:  Continue Amiodarone 200mg  qd, change to 200mg  qd at discharge.  Other recommendations (labs, testing, etc):  resume xarelto when stable Follow up as an outpatient:  4 weeks follow up after discharge.

## 2021-03-14 NOTE — Consult Note (Signed)
[place] Nurse wound follow up Patient receiving care in [facility] 2W01 Wound type: Surgical midline incision Wound bed: Clean, pink granulation tissue Measurements: 14 cm x 4 cm x 1.3 cm Drainage (amount, consistency, odor) Sanguinous in canister Periwound: intact Dressing procedure/placement/frequency: One piece of black foam dressing removed. One piece of black foam replaced. Drape applied, immediate seal obtained at 125 mmHg WOC to follow M/W/F  [place] Nurse ostomy follow up Stoma type/location: LUQ colostomy Stomal assessment/size: 1 3/8" red budded just above the level of the skin. Sutures in place.  Peristomal assessment: intact Treatment options for stomal/peristomal skin: Barrier ring Output: emptied 100cc of dark green/black sticky stool. Ostomy pouching: 1pc. convex Education provided: Wife was able to go through the entire process of changing the pouch with very little direction. Reviewed proper cleaning, frequency of pouch changes, when to empty. She also emptied the pouch today. Enrolled patient in [place] Start Discharge program: Yes (previously) Wife states SS kit received.   Cathlean Marseilles Tamala Julian, MSN, RN, [place], [name], [facility] Wound Treatment Associate Pager [phone]

## 2021-03-14 NOTE — TOC Progression Note (Signed)
Transition of Care [facility]) - Progression Note    Patient Details  Name: Travis Conrad MRN: [id number] Date of Birth: [birth year]  Transition of Care [facility]) CM/SW Contact  Joanne Chars, LCSW Phone Number: 03/14/2021, 1:21 PM  Clinical Narrative:  CSW spoke with Leontine Locket at [facility], who will assist with wound vac order.  CSW spoke with Carolynn Sayers, [facility] and informed that pt close to DC.     Expected Discharge Plan: [place] Barriers to Discharge: Continued Medical Work up  Expected Discharge Plan and Services Expected Discharge Plan: [place] In-house Referral: Clinical Social Work   Post Acute Care Choice: [place] arrangements for the past 2 months: Single Family Home                                       Social Determinants of Health (SDOH) Interventions    Readmission Risk Interventions No flowsheet data found.

## 2021-03-14 NOTE — Progress Notes (Addendum)
PHARMACY - TOTAL PARENTERAL NUTRITION CONSULT NOTE  Indication: Prolonged ileus  Patient Measurements: Height: 6' (182.9 cm) Weight: 102 kg (224 lb 13.9 oz) IBW/kg (Calculated) : 77.6 TPN AdjBW (KG): 82.6 Body mass index is 30.5 kg/m.  Assessment:  3 YOM with lower abdominal pain/N/V found to have perforated diverticulitis. CT abd on 9/3 found to have active ileus/pSBO from diverticulitis/bowel inflammation. Pharmacy consulted to manage TPN for prolonged ileus. Of note patient has a history pf alcohol dependency and is at high risk for refeeding.   Glucose / Insulin: no hx DM, A1c 5.9% - CBGs < 180.  Used 12 units SSI in past 24 hrs, 15 units insulin in TPN Electrolytes: 9/26 labs - Na up to 137 (max in TPN), K 4 (goal >/= 4 with ileus), others WNL Renal: SCr < 1 stable, BUN WNL Hepatic: LFTs / tbili / TG WNL, albumin 1.6 Intake / Output; MIVF: NG O/P 49mL, drain O/P 64mL, none from colostomy, UOP 0.29ml/kg/hr, LBM 9/6, vomited x2 9/23 when NGT clamped GI Meds: IV Reglan/6hr, PPI IV/24h ID: Zosyn through 17/71/16 for complicated diverticulitis with IA abscess GI Imaging:  - 9/3 CT abd: Active ileus/PSBO - 9/16 CT abd: L-iliopsoas fluid collection (abscess or hematoma), resolution of prior diverticular abscess, still w/ evidence of ileus GI Surgeries / Procedures:  - 9/10 ex-lap, sigmoid colectomy w/ end colostomy creation, enterorrhaphy, drainage of intra-abd abscess, wound vac placement  Central access: PICC line 02/24/21 TPN start date: 02/25/21  Nutritional Goals, RD Estimated Needs Total Energy Estimated Needs: 2000-2200 Total Protein Estimated Needs: 100-110 grams Total Fluid Estimated Needs: >2L  Current Nutrition:  TPN CLD started 9/23  Plan:  Per MD cut TPN in half today and likely off tomorrow. TPN decrease rate 45 ml/hr . Electrolytes in TPN: Na 119mEq/L, K 40mEq/L, Ca 2.61mEq/L, Mg 66mEq/L, Phos 86mmol/L, Cl:Ac 1:1 - no change today 9/26 Add standard MVI, trace  elements, folate and thiamine to TPN Continue moderate SSI Q4H and decrease to 8 units regular insulin in TPN  Standard TPN labs on Mon and Thurs.  Watch CBGs.  Alanda Slim, PharmD, [facility] Clinical Pharmacist Please see AMION for all Pharmacists' Contact Phone Numbers 03/14/2021, 8:12 AM

## 2021-03-15 ENCOUNTER — Inpatient Hospital Stay (HOSPITAL_COMMUNITY): Payer: Medicare Other

## 2021-03-15 DIAGNOSIS — K5720 Diverticulitis of large intestine with perforation and abscess without bleeding: Secondary | ICD-10-CM | POA: Diagnosis not present

## 2021-03-15 DIAGNOSIS — I482 Chronic atrial fibrillation, unspecified: Secondary | ICD-10-CM | POA: Diagnosis not present

## 2021-03-15 DIAGNOSIS — N179 Acute kidney failure, unspecified: Secondary | ICD-10-CM | POA: Diagnosis not present

## 2021-03-15 DIAGNOSIS — K5792 Diverticulitis of intestine, part unspecified, without perforation or abscess without bleeding: Secondary | ICD-10-CM | POA: Diagnosis not present

## 2021-03-15 LAB — GLUCOSE, CAPILLARY
Glucose-Capillary: 104 mg/dL — ABNORMAL HIGH (ref 70–99)
Glucose-Capillary: 139 mg/dL — ABNORMAL HIGH (ref 70–99)
Glucose-Capillary: 152 mg/dL — ABNORMAL HIGH (ref 70–99)
Glucose-Capillary: 129 mg/dL — ABNORMAL HIGH (ref 70–99)
Glucose-Capillary: 149 mg/dL — ABNORMAL HIGH (ref 70–99)

## 2021-03-15 MED ORDER — LORAZEPAM 2 MG/ML IJ SOLN
0.5000 mg | Freq: Once | INTRAMUSCULAR | Status: AC
  Administered 2021-03-15: 0.5 mg via INTRAVENOUS
  Filled 2021-03-15: qty 1

## 2021-03-15 MED ORDER — IOHEXOL 350 MG/ML SOLN
80.0000 mL | Freq: Once | INTRAVENOUS | Status: AC | PRN
  Administered 2021-03-15: 80 mL via INTRAVENOUS

## 2021-03-15 MED ORDER — TRAVASOL 10 % IV SOLN
INTRAVENOUS | Status: DC
  Filled 2021-03-15 (×3): qty 1020

## 2021-03-15 MED ORDER — TRAVASOL 10 % IV SOLN
INTRAVENOUS | Status: AC
  Filled 2021-03-15 (×3): qty 1020

## 2021-03-15 MED ORDER — TRAVASOL 10 % IV SOLN: INTRAVENOUS | Status: DC

## 2021-03-15 MED ORDER — IOHEXOL 9 MG/ML PO SOLN
ORAL | Status: AC
  Administered 2021-03-15: 500 mL
  Filled 2021-03-15: qty 1000

## 2021-03-15 MED ORDER — IOHEXOL 9 MG/ML PO SOLN: 500.0000 mL | ORAL | Status: AC

## 2021-03-15 NOTE — Progress Notes (Signed)
Progress Note  17 Days Post-Op  Subjective: Patient vomited overnight and complaining of abdominal pain this AM. He feels nauseated like he might vomit again. He was able to sit up for an hour and ambulate in hall yesterday.   Objective: Vital signs in last 24 hours: Temp:  [98.1 F (36.7 C)-99.7 F (37.6 C)] 99.7 F (37.6 C) (09/27 0330) Pulse Rate:  [75-87] 87 (09/26 1943) Resp:  [16-17] 16 (09/27 0330) BP: (106-133)/(63-76) 118/68 (09/27 0330) SpO2:  [94 %-97 %] 94 % (09/27 0330) Last BM Date: 03/14/21  Intake/Output from previous day: 09/26 0701 - 09/27 0700 In: 2024.4 [P.O.:120; I.V.:1604.4; IV Piggyback:300] Out: 1330 [Urine:950; Emesis/NG output:150; Drains:230] Intake/Output this shift: No intake/output data recorded.  PE: Gen:  Alert, NAD, pleasant Pulm:  Normal rate and effort. Abd: Soft, increased distention, diffuse mild ttp without peritonitis.  Drain w/ SS output. Stoma appears pink and viable. Thick stool in colostomy bag. High pitched BS. Vac to midline incision with good seal.  Msk: SCD's in place Skin: no rashes noted, warm and dry   Lab Results:  Recent Labs    03/13/21 0209 03/14/21 0636  WBC 7.7 7.4  HGB 8.1* 8.8*  HCT 25.3* 26.3*  PLT 406* 383   BMET Recent Labs    03/14/21 0636  NA 137  K 4.0  CL 105  CO2 26  GLUCOSE 148*  BUN 22  CREATININE 0.88  CALCIUM 8.0*   PT/INR No results for input(s): LABPROT, INR in the last 72 hours. CMP     Component Value Date/Time   NA 137 03/14/2021 0636   NA 134 09/30/2019 0959   NA 142 03/03/2016 0921   K 4.0 03/14/2021 0636   K 4.3 03/03/2016 0921   CL 105 03/14/2021 0636   CO2 26 03/14/2021 0636   CO2 25 03/03/2016 0921   GLUCOSE 148 (H) 03/14/2021 0636   GLUCOSE 89 03/03/2016 0921   BUN 22 03/14/2021 0636   BUN 11 09/30/2019 0959   BUN 13.6 03/03/2016 0921   CREATININE 0.88 03/14/2021 0636   CREATININE 1.2 03/03/2016 0921   CALCIUM 8.0 (L) 03/14/2021 0636   CALCIUM 9.6  03/03/2016 0921   PROT 5.3 (L) 03/14/2021 0636   PROT 7.3 03/03/2016 0921   ALBUMIN 1.6 (L) 03/14/2021 0636   ALBUMIN 3.8 03/03/2016 0921   AST 16 03/14/2021 0636   AST 36 (H) 03/03/2016 0921   ALT 18 03/14/2021 0636   ALT 31 03/03/2016 0921   ALKPHOS 84 03/14/2021 0636   ALKPHOS 116 03/03/2016 0921   BILITOT 0.6 03/14/2021 0636   BILITOT 0.71 03/03/2016 0921   GFRNONAA >60 03/14/2021 0636   GFRNONAA 67 07/25/2013 1712   GFRAA >60 10/03/2019 0930   GFRAA 77 07/25/2013 1712   Lipase     Component Value Date/Time   LIPASE 22 02/19/2021 1653       Studies/Results: DG Abd Portable 1V  Result Date: 03/15/2021 CLINICAL DATA:  Bowel obstruction. Nausea and vomiting. Recent colectomy. EXAM: PORTABLE ABDOMEN - 1 VIEW COMPARISON:  03/11/2021 FINDINGS: The enteric tube overlying the stomach on the prior study is no longer visualized and may have been removed. A drain overlies the lower abdomen/pelvis. There is moderate gaseous distension of the stomach, and there is mild gaseous distension of multiple small bowel loops. Gas is also present in the colon. No acute osseous abnormality is seen. IMPRESSION: Mild small bowel dilatation which may reflect ileus or possibly partial obstruction. Electronically Signed   By:  Logan Bores M.D.   On: 03/15/2021 06:54    Anti-infectives: Anti-infectives (From admission, onward)    Start     Dose/Rate Route Frequency Ordered Stop   03/09/21 0800  piperacillin-tazobactam (ZOSYN) IVPB 3.375 g        3.375 g 12.5 mL/hr over 240 Minutes Intravenous Every 8 hours 03/09/21 0706 04/04/21 0759   02/26/21 0100  doxycycline (VIBRAMYCIN) 100 mg in sodium chloride 0.9 % 250 mL IVPB        100 mg 125 mL/hr over 120 Minutes Intravenous 2 times daily 02/26/21 0005 03/08/21 2359   02/20/21 0300  piperacillin-tazobactam (ZOSYN) IVPB 3.375 g        3.375 g 12.5 mL/hr over 240 Minutes Intravenous Every 8 hours 02/19/21 1907 03/08/21 2359   02/19/21 1900  ceFEPIme  (MAXIPIME) 2 g in sodium chloride 0.9 % 100 mL IVPB  Status:  Discontinued        2 g 200 mL/hr over 30 Minutes Intravenous  Once 02/19/21 1857 02/19/21 1906   02/19/21 1900  metroNIDAZOLE (FLAGYL) IVPB 500 mg  Status:  Discontinued        500 mg 100 mL/hr over 60 Minutes Intravenous  Once 02/19/21 1857 02/19/21 1906   02/19/21 1815  piperacillin-tazobactam (ZOSYN) IVPB 3.375 g        3.375 g 100 mL/hr over 30 Minutes Intravenous  Once 02/19/21 1809 02/19/21 1944        Assessment/Plan POD 17 s/p ex lap, sigmoid colectomy, end colostomy with enterorrhaphy and drainage of intraabdominal abscess - Dr. Kieth Brightly and Dr. Radene Knee 9/10 for Acute sigmoid diverticulitis with contained perforation and abscess - CT 9/16 demonstrates heterogenous left iliopsoas collection likely hematoma, stranding in the retroperitoneum and subcu tissue.  Distal colon appears decompressed and very small caliber but there is contrast entering this area; there is additionally a similarly small-caliber segment of colon in the proximal transverse and the ascending colon is not dilated- suspect over all this represents small bowel/gastric ileus.  IR does not recommend draining the hematoma at this point. ID following and recommending Zosyn through 10/17 w/ repeat CT in 2 weeks - WBC normalized. Consider CT earlier than 2 weeks if struggling with bowel function  - NGT was removed 9/26 and patient started vomiting early this AM - film with large stomach this AM and some dilated small bowel, NGT being replaced - Will allow some time to decompress after NGT placement and then would recommend repeat CT later today given lack of improvement in post-op ileus  - Was started on reglan last weekend. Discussed with him the risk of tardive dyskinesia/EPS on 9/20. Patient stated understanding and is okay with continuing Reglan.  - Cont drain  - Wound vac MWF - WOCN following for new ostomy teaching and vac changes - Cont TPN - Encouraged  ambulation (working with therapies), use IS, multimodal pain control  - Cont abx per ID recs   FEN - NPO, NGT to LIWS, TPN VTE - SCDs, therapeutic lovenox  ID - Zosyn 9/4 - 10/17 (per ID) Foley - out, voiding.    Per primary: A fib - cards following  AKI - resolved ABL anemia -  stable HTN ETOH use L knee pain - per ortho  LOS: 23 days    Norm Parcel, [facility] Surgery 03/15/2021, 8:16 AM Please see Amion for pager number during day hours 7:00am-4:30pm

## 2021-03-15 NOTE — Progress Notes (Addendum)
HOSPITAL MEDICINE OVERNIGHT EVENT NOTE    Notified by nursing that patient has exhibited 2 episodes of bilious vomiting this morning.   Chart reviewed, patient hospitalized for severe sepsis due to perforated diverticulitis status post colectomy complicated by left psoas collection and postoperative ileus.  NG tube removed yesterday and patient was started on a clear liquid diet.  Nursing reports that abdomen is soft on exam.  Patient complains of abdominal pain but there is no significant change compared to the day prior.  Will make patient n.p.o. for now, obtain abdominal film to ensure there is no recurrent ileus or obstruction.  Patient continuing to receive TPN for nutrition in the meantime.  Vernelle Emerald  MD [facility]   ADDENDUM (7am 9/27)  Abdominal x-ray reviewed revealing recurrent small bowel obstruction or ileus.  We will make patient n.p.o., place order for repeat NG tube to low intermittent suction.  Will inform day provider so that they can follow-up closely.  Sherryll Burger Valentine Barney

## 2021-03-15 NOTE — Progress Notes (Signed)
PROGRESS NOTE    Travis Conrad  GUY:[id number] DOB: 12/23/1950 DOA: 02/19/2021 PCP: Travis Mariscal, MD    Brief Narrative:  Travis Conrad was admitted to the hospital with the working diagnosis of severe sepsis due to perforated diverticulitis/ intra-abdominal abscess. Now sp lapartomy, with colectomy, complicated with postoperative ileus.   70 year old male past medical history for atrial fibrillation, dyslipidemia, hypertension and diverticulosis who presented with nausea, vomiting and abdominal pain.  Reported several days of worse abdominal pain, and poor oral intake.  Because of severe symptoms he presented to the hospital.  On his initial physical examination his blood pressure was 84/65, heart rate 57, respiratory rate 20, oxygen saturation 91%, he was ill-looking appearing, heart S1-S2, present, irregular irregular, lungs clear to auscultation, abdomen tender to palpation in the mid abdomen, no lower extremity edema.   CT of the abdomen and pelvis with acute sigmoid diverticulitis with small extraluminal gas and fluid collection in the sigmoid mesocolon, 3.5 x 1.5 x 2.7 cm consistent with perforation and abscess.  Dilated proximal and decompressed distal small bowel loops, possible ileus.   9/4 developed atrial fibrillation with rapid ventricular response, required intravenous amiodarone infusion.   Follow-up CT 9/7 with increased size pelvic abscess. On 9/8 patient underwent CT-guided drainage of pelvic abscess.   09/10 Patient underwent exploratory laparotomy with sigmoid colectomy and diverting colostomy placement.  Postoperative ileus.   Urology and ID have been consulted for epididymitis.   9/19 Postoperative anemia: Required 1 unit packed red blood cells.    Positive left knee pain, positive effusion, underwent left knee aspiration and injection with good toleration.  Fluid with no crystals, gram stain no organisms.   Slowly resolving ileus. Patient had NG tube clamped but  patient vomiting, needing to reconnect tube to suction.     09/26 NG tube has been removed.   09/27 patient with vomiting and abdominal distention, abdominal radiographs with recurrent ileus, NG tube has replaced and connected to low intermittent suction.    Assessment & Plan:   Principal Problem:   Diverticulitis of large intestine with perforation and abscess without bleeding Active Problems:   Diverticulitis of intestine with abscess   Atrial fibrillation, chronic (HCC)   Essential hypertension   Diverticulitis   AKI (acute kidney injury) ([place])   Severe sepsis with acute organ dysfunction (HCC)   Intra-abdominal abscess (HCC)   Epididymitis   Severe sepsis due to perforated diverticulitis, sp colectomy. Left psoas collection. Post operative ileus.  Patient with persistent/ recurrent ileus, NG tube was replaced yesterday.  This am patient with pain at the entry site of the NG tube and throat.  Tube position confirmed per radiograph.   Plan to continue supportive medical care with as needed analgesics and antiemetics.  Continue NG tube to lowe intermittent suction, follow up with CT of the abdomen and pelvis Qid metoclopramide.    Plan to continue antibiotic therapy until 04/04/21 per ID recommendations, patient has a PICC line to continue outpatient antibiotic therapy.   2. Post operative anemia. sp 1 unit PRBC  Check cell count in the morning.    3. Paroxysmal atrial fibrillation/ HTN. On amiodarone and metoprolol with good rate control.    On enoxaparin for anticoagulation, holding DOAC until po intake more consistent.  Continue with amlodipine.    4. Right epididymitis resolved, completed therapy with doxycycline.    5. AKI with hypokalemia/ hyperkalemia,hyponatremia and hypophosphatemia.  Continue TPN per pharmacy protocol. His electrolytes have been stable with K at 4  and Mg at 2, NA is 137 today.    6. Alcohol withdrawal. Resolved.    7. Left knee arthritis.  Clinically improved after steroid injection.  Pain is well controlled.    Patient continue to be at high risk for worsening ileus.   Status is: Inpatient  Remains inpatient appropriate because:Inpatient level of care appropriate due to severity of illness  Dispo: The patient is from: Home              Anticipated d/c is to: Home              Patient currently is not medically stable to d/c.   Difficult to place patient No   DVT prophylaxis: Enoxxaparin   Code Status:    full  Family Communication:  I spoke with patient's wife at the bedside, we talked in detail about patient's condition, plan of care and prognosis and all questions were addressed.      Nutrition Status: Nutrition Problem: Inadequate oral intake Etiology: acute illness (diverticulitis) Signs/Symptoms: per patient/family report, NPO status Interventions: Prostat, Boost Plus    Consultants:  Surgery   Subjective: Patient has pain in his throat and nose, at the site of the NG tube, no vomiting, but occasional nausea. NG tube has been replaced,   Objective: Vitals:   03/14/21 0526 03/14/21 1412 03/14/21 1943 03/15/21 0330  BP: 115/68 106/63 133/76 118/68  Pulse: 71 75 87   Resp: 18 16 17 16   Temp: 98 F (36.7 C) 98.1 F (36.7 C) 98.3 F (36.8 C) 99.7 F (37.6 C)  TempSrc:   Oral Oral  SpO2: 96%  97% 94%  Weight:      Height:        Intake/Output Summary (Last 24 hours) at 03/15/2021 1235 Last data filed at 03/15/2021 0500 Gross per 24 hour  Intake 2024.44 ml  Output 1220 ml  Net 804.44 ml   Filed Weights   03/06/21 0348 03/07/21 0342 03/10/21 0447  Weight: 85.6 kg 94 kg 102 kg    Examination:   General: Deconditioned and in pain.  Neurology: Awake and alert, non focal  E ENT: mild pallor, no icterus, oral mucosa moist. NG tube in place.  Cardiovascular: No JVD. S1-S2 present, rhythmic, no gallops, rubs, or murmurs. No lower extremity edema. Pulmonary: vesicular breath sounds  bilaterally, adequate air movement, no wheezing, rhonchi or rales. Gastrointestinal. Abdomen mild distended but not tender. Colostomy bag in place, surgical wound with wound vac in place. Skin. No rashes Musculoskeletal: no joint deformities     Data Reviewed: I have personally reviewed following labs and imaging studies  CBC: Recent Labs  Lab 03/09/21 0011 03/10/21 0512 03/11/21 0159 03/13/21 0209 03/14/21 0636  WBC 13.5* 14.3* 11.9* 7.7 7.4  HGB 8.0* 8.4* 7.9* 8.1* 8.8*  HCT 23.6* 24.9* 23.2* 25.3* 26.3*  MCV 100.4* 101.2* 101.3* 103.3* 101.5*  PLT 347 413* 415* 406* 725   Basic Metabolic Panel: Recent Labs  Lab 03/09/21 0011 03/10/21 0512 03/12/21 0407 03/14/21 0636  NA 132* 132* 135 137  K 5.2* 4.7 3.6 4.0  CL 103 102 105 105  CO2 23 24 24 26   GLUCOSE 182* 129* 120* 148*  BUN 24* 25* 23 22  CREATININE 0.88 0.90 0.85 0.88  CALCIUM 8.1* 8.6* 8.0* 8.0*  MG 2.1 2.1  --  2.1  PHOS 3.9 4.3 3.4 3.3   GFR: Estimated Creatinine Clearance: 96.6 mL/min (by C-G formula based on SCr of 0.88 mg/dL). Liver Function Tests: Recent  Labs  Lab 03/10/21 0512 03/14/21 0636  AST 20 16  ALT 20 18  ALKPHOS 117 84  BILITOT 1.1 0.6  PROT 5.5* 5.3*  ALBUMIN 1.7* 1.6*   No results for input(s): LIPASE, AMYLASE in the last 168 hours. No results for input(s): AMMONIA in the last 168 hours. Coagulation Profile: No results for input(s): INR, PROTIME in the last 168 hours. Cardiac Enzymes: No results for input(s): CKTOTAL, CKMB, CKMBINDEX, TROPONINI in the last 168 hours. BNP (last 3 results) No results for input(s): PROBNP in the last 8760 hours. HbA1C: No results for input(s): HGBA1C in the last 72 hours. CBG: Recent Labs  Lab 03/14/21 1946 03/14/21 2339 03/15/21 0326 03/15/21 0735 03/15/21 1140  GLUCAP 120* 123* 104* 139* 152*   Lipid Profile: Recent Labs    03/14/21 0636  TRIG 68   Thyroid Function Tests: No results for input(s): TSH, T4TOTAL, FREET4, T3FREE,  THYROIDAB in the last 72 hours. Anemia Panel: No results for input(s): VITAMINB12, FOLATE, FERRITIN, TIBC, IRON, RETICCTPCT in the last 72 hours.    Radiology Studies: I have reviewed all of the imaging during this hospital visit personally     Scheduled Meds:  (feeding supplement) PROSource Plus  30 mL Oral BID BM   amiodarone  200 mg Per NG tube BID   amLODipine  10 mg Per Tube Daily   Chlorhexidine Gluconate Cloth  6 each Topical Daily   enoxaparin (LOVENOX) injection  100 mg Subcutaneous Q12H   feeding supplement  1 Container Oral TID BM   insulin aspart  0-15 Units Subcutaneous Q4H   iohexol       lidocaine  1 patch Transdermal Q24H   melatonin  3 mg Per Tube QHS   metoCLOPramide (REGLAN) injection  10 mg Intravenous Q6H   metoprolol tartrate  100 mg Per Tube BID   nicotine  21 mg Transdermal Daily   pantoprazole  40 mg Oral BID   sodium chloride flush  10 mL Intracatheter Q12H   sucralfate  1 g Oral TID WC & HS   Continuous Infusions:  sodium chloride Stopped (03/05/21 0320)   piperacillin-tazobactam (ZOSYN)  IV 3.375 g (03/15/21 0937)   TPN ADULT (ION) 45 mL/hr at 03/15/21 0500   TPN ADULT (ION)     TPN ADULT (ION)       LOS: 23 days        Dore Oquin Gerome Apley, MD

## 2021-03-15 NOTE — Progress Notes (Signed)
Patient had another episode of vomiting. He vomited approximately 100cc of thing, yellow bile. BS present in all 4 quads. Reglan administered as ordered. Patient cleaned and comforted. MD notified. Ordered to make Patient NPO until results of abdominal xray are available.

## 2021-03-15 NOTE — Progress Notes (Signed)
OT Cancellation Note  Patient Details Name: Travis Conrad MRN: [id number] DOB: January 06, 1951   Cancelled Treatment:    Reason Eval/Treat Not Completed: Medical issues which prohibited therapy.  Nursing states that NG tub was removed then replaced. Patient had vomiting after and is not feeling well.  Check with patient and wife confirmed patient was not feeling well.   Damiana Berrian Alexis Goodell 03/15/2021, 1:39 PM

## 2021-03-15 NOTE — Progress Notes (Signed)
Patient had 1 episode of vomiting. He vomited approximately 50cc of thin yellow,milky bile. BS present. Zofran administered. Patient cleaned and VS acquired.

## 2021-03-15 NOTE — Progress Notes (Signed)
PHARMACY - TOTAL PARENTERAL NUTRITION CONSULT NOTE  Indication: Prolonged ileus  Patient Measurements: Height: 6' (182.9 cm) Weight: 102 kg (224 lb 13.9 oz) IBW/kg (Calculated) : 77.6 TPN AdjBW (KG): 82.6 Body mass index is 30.5 kg/m.  Assessment:  27 YOM with lower abdominal pain/N/V found to have perforated diverticulitis. CT abd on 9/3 found to have active ileus/pSBO from diverticulitis/bowel inflammation. Pharmacy consulted to manage TPN for prolonged ileus. Of note patient has a history pf alcohol dependency and is at high risk for refeeding.   Glucose / Insulin: no hx DM, A1c 5.9% - CBGs < 180.  Used 8 units SSI in past 24 hrs, 15 units insulin in TPN Electrolytes: 9/26 labs - Na up to 137 (max in TPN), K 4 (goal >/= 4 with ileus), others WNL Renal: SCr < 1 stable, BUN WNL Hepatic: LFTs / tbili / TG WNL, albumin 1.6 Intake / Output; MIVF: NG O/P 150 mL, drain O/P 254mL, none from colostomy, UOP 0.51ml/kg/hr, LBM 9/6, vomited 9/27  GI Meds: IV Reglan/6hr, PPI IV/24h ID: Zosyn through 74/08/14 for complicated diverticulitis with IA abscess GI Imaging:  - 9/3 CT abd: Active ileus/PSBO - 9/16 CT abd: L-iliopsoas fluid collection (abscess or hematoma), resolution of prior diverticular abscess, still w/ evidence of ileus GI Surgeries / Procedures:  - 9/10 ex-lap, sigmoid colectomy w/ end colostomy creation, enterorrhaphy, drainage of intra-abd abscess, wound vac placement  Central access: PICC line 02/24/21 TPN start date: 02/25/21  Nutritional Goals, RD Estimated Needs Total Energy Estimated Needs: 2000-2200 Total Protein Estimated Needs: 100-110 grams Total Fluid Estimated Needs: >2L  Current Nutrition:  TPN CLD started 9/23 > NPO on 9/27 with NG to LIWS   Plan:  Per discussion with surgery, will resume full TPN at goal rate 85 ml/hr to provide 102g AA, 367g CHO and 45g ILE for a total of 2104 kCal, meeting 100% of patient's needs Electrolytes in TPN: Na 115mEq/L, K 29mEq/L,  Ca 2.64mEq/L, Mg 49mEq/L, Phos 23mmol/L, Cl:Ac 1:1 - no change today 9/27 Add standard MVI, trace elements, folate and thiamine to TPN Continue moderate SSI Q4H and increase back to 15 units regular insulin in TPN  Standard TPN labs on Mon and Thurs. Watch CBGs.  Albertina Parr, PharmD., BCPS, BCCCP Clinical Pharmacist Please refer to [facility] for unit-specific pharmacist

## 2021-03-15 NOTE — Progress Notes (Signed)
First contrast given per Dr. Nolon Lennert orders.  Patient tolerated well.  While giving 2nd contrast about an hour later, patient began to have vomiting.  Stopped giving contrast with about 150 ml left.

## 2021-03-16 ENCOUNTER — Inpatient Hospital Stay (HOSPITAL_COMMUNITY): Payer: Medicare Other

## 2021-03-16 DIAGNOSIS — K5720 Diverticulitis of large intestine with perforation and abscess without bleeding: Secondary | ICD-10-CM | POA: Diagnosis not present

## 2021-03-16 LAB — CBC WITH DIFFERENTIAL/PLATELET
Abs Immature Granulocytes: 0.03 10*3/uL (ref 0.00–0.07)
Basophils Absolute: 0 10*3/uL (ref 0.0–0.1)
Basophils Relative: 0 %
Eosinophils Absolute: 0 10*3/uL (ref 0.0–0.5)
Eosinophils Relative: 0 %
HCT: 26.7 % — ABNORMAL LOW (ref 39.0–52.0)
Hemoglobin: 8.1 g/dL — ABNORMAL LOW (ref 13.0–17.0)
Immature Granulocytes: 1 %
Lymphocytes Relative: 14 %
Lymphs Abs: 0.7 10*3/uL (ref 0.7–4.0)
MCH: 34.2 pg — ABNORMAL HIGH (ref 26.0–34.0)
MCHC: 30.3 g/dL (ref 30.0–36.0)
MCV: 112.7 fL — ABNORMAL HIGH (ref 80.0–100.0)
Monocytes Absolute: 0.2 10*3/uL (ref 0.1–1.0)
Monocytes Relative: 5 %
Neutro Abs: 4.1 10*3/uL (ref 1.7–7.7)
Neutrophils Relative %: 80 %
Platelets: 278 10*3/uL (ref 150–400)
RBC: 2.37 MIL/uL — ABNORMAL LOW (ref 4.22–5.81)
RDW: 15.1 % (ref 11.5–15.5)
WBC: 5.1 10*3/uL (ref 4.0–10.5)
nRBC: 0 % (ref 0.0–0.2)

## 2021-03-16 LAB — BASIC METABOLIC PANEL
Anion gap: 5 (ref 5–15)
BUN: 23 mg/dL (ref 8–23)
CO2: 26 mmol/L (ref 22–32)
Calcium: 7.9 mg/dL — ABNORMAL LOW (ref 8.9–10.3)
Chloride: 107 mmol/L (ref 98–111)
Creatinine, Ser: 1.04 mg/dL (ref 0.61–1.24)
GFR, Estimated: >60 mL/min (ref >60)
Glucose, Bld: 158 mg/dL — ABNORMAL HIGH (ref 70–99)
Potassium: 3.6 mmol/L (ref 3.5–5.1)
Sodium: 138 mmol/L (ref 135–145)

## 2021-03-16 LAB — GLUCOSE, CAPILLARY
Glucose-Capillary: 157 mg/dL — ABNORMAL HIGH (ref 70–99)
Glucose-Capillary: 154 mg/dL — ABNORMAL HIGH (ref 70–99)
Glucose-Capillary: 133 mg/dL — ABNORMAL HIGH (ref 70–99)
Glucose-Capillary: 145 mg/dL — ABNORMAL HIGH (ref 70–99)
Glucose-Capillary: 161 mg/dL — ABNORMAL HIGH (ref 70–99)
Glucose-Capillary: 149 mg/dL — ABNORMAL HIGH (ref 70–99)

## 2021-03-16 LAB — MAGNESIUM: Magnesium: 2.1 mg/dL (ref 1.7–2.4)

## 2021-03-16 LAB — PHOSPHORUS: Phosphorus: 3.6 mg/dL (ref 2.5–4.6)

## 2021-03-16 MED ORDER — DIATRIZOATE MEGLUMINE & SODIUM 66-10 % PO SOLN
90.0000 mL | Freq: Once | ORAL | Status: AC
  Administered 2021-03-16: 90 mL via NASOGASTRIC
  Filled 2021-03-16: qty 90

## 2021-03-16 MED ORDER — ACETAMINOPHEN 650 MG RE SUPP: 650.0000 mg | RECTAL | Status: DC | PRN

## 2021-03-16 MED ORDER — PANTOPRAZOLE SODIUM 40 MG IV SOLR
40.0000 mg | Freq: Two times a day (BID) | INTRAVENOUS | Status: DC
  Administered 2021-03-16 – 2021-03-28 (×25): 40 mg via INTRAVENOUS
  Filled 2021-03-16 (×25): qty 40

## 2021-03-16 MED ORDER — TRAVASOL 10 % IV SOLN: INTRAVENOUS | Status: DC

## 2021-03-16 MED ORDER — SUCRALFATE 1 GM/10ML PO SUSP: 1.0000 g | Freq: Three times a day (TID) | ORAL | Status: DC

## 2021-03-16 MED ORDER — ACETAMINOPHEN 500 MG PO TABS: 1000.0000 mg | ORAL_TABLET | Freq: Four times a day (QID) | ORAL | Status: DC | PRN

## 2021-03-16 MED ORDER — PANTOPRAZOLE 2 MG/ML SUSPENSION: 40.0000 mg | Freq: Two times a day (BID) | ORAL | Status: DC

## 2021-03-16 MED ORDER — TRAVASOL 10 % IV SOLN
INTRAVENOUS | Status: AC
  Filled 2021-03-16: qty 1020

## 2021-03-16 MED ORDER — METOPROLOL TARTRATE 5 MG/5ML IV SOLN
10.0000 mg | Freq: Three times a day (TID) | INTRAVENOUS | Status: DC
  Administered 2021-03-16 – 2021-03-21 (×14): 10 mg via INTRAVENOUS
  Filled 2021-03-16 (×14): qty 10

## 2021-03-16 MED ORDER — POTASSIUM CHLORIDE 10 MEQ/50ML IV SOLN
10.0000 meq | INTRAVENOUS | Status: AC
Start: 2021-03-16 — End: 2021-03-16
  Administered 2021-03-16 (×2): 10 meq via INTRAVENOUS
  Filled 2021-03-16 (×2): qty 50

## 2021-03-16 MED ORDER — MORPHINE SULFATE (PF) 2 MG/ML IV SOLN
2.0000 mg | INTRAVENOUS | Status: DC | PRN
  Administered 2021-03-16: 2 mg via INTRAVENOUS
  Administered 2021-03-16 (×2): 4 mg via INTRAVENOUS
  Administered 2021-03-17 (×4): 2 mg via INTRAVENOUS
  Administered 2021-03-18 (×6): 4 mg via INTRAVENOUS
  Administered 2021-03-18: 2 mg via INTRAVENOUS
  Administered 2021-03-19 (×6): 4 mg via INTRAVENOUS
  Administered 2021-03-20: 2 mg via INTRAVENOUS
  Administered 2021-03-20 – 2021-03-22 (×9): 4 mg via INTRAVENOUS
  Administered 2021-03-23: 2 mg via INTRAVENOUS
  Administered 2021-03-24 – 2021-03-28 (×9): 4 mg via INTRAVENOUS
  Filled 2021-03-16: qty 2
  Filled 2021-03-16: qty 1
  Filled 2021-03-16: qty 2
  Filled 2021-03-16: qty 1
  Filled 2021-03-16 (×3): qty 2
  Filled 2021-03-16: qty 1
  Filled 2021-03-16 (×8): qty 2
  Filled 2021-03-16: qty 1
  Filled 2021-03-16 (×4): qty 2
  Filled 2021-03-16: qty 1
  Filled 2021-03-16 (×7): qty 2
  Filled 2021-03-16: qty 1
  Filled 2021-03-16 (×7): qty 2
  Filled 2021-03-16 (×2): qty 1
  Filled 2021-03-16: qty 2
  Filled 2021-03-16: qty 1
  Filled 2021-03-16 (×3): qty 2

## 2021-03-16 NOTE — Progress Notes (Signed)
Progress Note  18 Days Post-Op  Subjective: Patient reports he is not feeling well. Reports pain in LLQ still.   Objective: Vital signs in last 24 hours: Temp:  [98.1 F (36.7 C)-100.5 F (38.1 C)] 98.5 F (36.9 C) (09/28 0722) Pulse Rate:  [98-132] 100 (09/28 0722) Resp:  [16-20] 18 (09/28 0722) BP: (104-121)/(61-88) 121/62 (09/28 0722) SpO2:  [93 %-96 %] 96 % (09/28 0722) Weight:  [98.9 kg] 98.9 kg (09/28 0343) Last BM Date: 03/14/21  Intake/Output from previous day: 09/27 0701 - 09/28 0700 In: -  Out: 1575 [Urine:550; Emesis/NG output:1000; Drains:25] Intake/Output this shift: No intake/output data recorded.  PE: Gen:  Alert, NAD, pleasant Pulm:  Normal rate and effort. Abd: Soft, ND, diffuse mild ttp without peritonitis.  Drain w/ SS output. Stoma appears pink and viable. Thick stool in colostomy bag. midline wound as noted below with healthy granulation tissue; NGT flushes easily but does not appear to functioning correctly (discussed with RN to have wall suction set up checked)  Msk: SCD's in place Skin: no rashes noted, warm and dry   Lab Results:  Recent Labs    03/14/21 0636  WBC 7.4  HGB 8.8*  HCT 26.3*  PLT 383   BMET Recent Labs    03/14/21 0636 03/16/21 0154  NA 137 138  K 4.0 3.6  CL 105 107  CO2 26 26  GLUCOSE 148* 158*  BUN 22 23  CREATININE 0.88 1.04  CALCIUM 8.0* 7.9*   PT/INR No results for input(s): LABPROT, INR in the last 72 hours. CMP     Component Value Date/Time   NA 138 03/16/2021 0154   NA 134 09/30/2019 0959   NA 142 03/03/2016 0921   K 3.6 03/16/2021 0154   K 4.3 03/03/2016 0921   CL 107 03/16/2021 0154   CO2 26 03/16/2021 0154   CO2 25 03/03/2016 0921   GLUCOSE 158 (H) 03/16/2021 0154   GLUCOSE 89 03/03/2016 0921   BUN 23 03/16/2021 0154   BUN 11 09/30/2019 0959   BUN 13.6 03/03/2016 0921   CREATININE 1.04 03/16/2021 0154   CREATININE 1.2 03/03/2016 0921   CALCIUM 7.9 (L) 03/16/2021 0154   CALCIUM 9.6  03/03/2016 0921   PROT 5.3 (L) 03/14/2021 0636   PROT 7.3 03/03/2016 0921   ALBUMIN 1.6 (L) 03/14/2021 0636   ALBUMIN 3.8 03/03/2016 0921   AST 16 03/14/2021 0636   AST 36 (H) 03/03/2016 0921   ALT 18 03/14/2021 0636   ALT 31 03/03/2016 0921   ALKPHOS 84 03/14/2021 0636   ALKPHOS 116 03/03/2016 0921   BILITOT 0.6 03/14/2021 0636   BILITOT 0.71 03/03/2016 0921   GFRNONAA >60 03/16/2021 0154   GFRNONAA 67 07/25/2013 1712   GFRAA >60 10/03/2019 0930   GFRAA 77 07/25/2013 1712   Lipase     Component Value Date/Time   LIPASE 22 02/19/2021 1653       Studies/Results: CT ABDOMEN PELVIS W CONTRAST  Result Date: 03/15/2021 CLINICAL DATA:  Bowel obstruction, nausea and vomiting, recent colectomy EXAM: CT ABDOMEN AND PELVIS WITH CONTRAST TECHNIQUE: Multidetector CT imaging of the abdomen and pelvis was performed using the standard protocol following bolus administration of intravenous contrast. CONTRAST:  57mL OMNIPAQUE IOHEXOL 350 MG/ML SOLN COMPARISON:  03/04/2021 FINDINGS: Lower chest: There are small bilateral pleural effusions, right greater than left. Dependent lower lobe atelectasis. No acute airspace disease or pneumothorax. Hepatobiliary: Minimal gallbladder sludge layering dependently. No calcified gallstones or cholecystitis. The liver is unremarkable.  Pancreas: Unremarkable. No pancreatic ductal dilatation or surrounding inflammatory changes. Spleen: Normal in size without focal abnormality. 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 Right adrenals unremarkable. The kidneys enhance normally and symmetrically. No urinary tract calculi or obstructive uropathy. The bladder is unremarkable. Stomach/Bowel: Enteric catheter within the gastric lumen. Multiple dilated loops of proximal small bowel measuring up to 4.5 cm consistent with small-bowel obstruction. Transition within the distal jejunum and ileum, with  decompression of the colon to the level of left lower quadrant colostomy. Postsurgical changes are seen from sigmoid colon resection. Vascular/Lymphatic: Aortic atherosclerosis. No enlarged abdominal or pelvic lymph nodes. Reproductive: Prostate is unremarkable. Other: Surgical drain remains coiled within the lower pelvis. The complex fluid collection along the left psoas muscle again identified, measuring up to 8.1 by 4.9 by 2 13.3 cm, not significantly changed since prior study. This fluid collection demonstrates increased attenuation, and could reflect retracting postoperative hematoma or organizing phlegmon/abscess. No free intraperitoneal gas or free fluid. Postsurgical changes from midline laparotomy and left lower quadrant colostomy. Musculoskeletal: No acute or destructive bony lesions. Reconstructed images demonstrate no additional findings. IMPRESSION: 1. Continued retroperitoneal complex fluid collection along the anterior margin of the left psoas muscle. Differential includes organizing infection/abscess versus postoperative hematoma. 2. Small-bowel obstruction, transition in the mid jejunum. 3. Postsurgical changes from partial colon resection and left lower quadrant colostomy. 4. Small bilateral pleural effusions and dependent lower lobe atelectasis. 5.  Aortic Atherosclerosis (ICD10-I70.0). Electronically Signed   By: Randa Ngo M.D.   On: 03/15/2021 21:48   DG Abd Portable 1V  Result Date: 03/15/2021 CLINICAL DATA:  NG tube placement EXAM: PORTABLE ABDOMEN - 1 VIEW COMPARISON:  03/15/2021 FINDINGS: Slight advancement of the NG tube with the tip just into the stomach. The side port remains in the distal esophagus. Increasing gaseous distention of the stomach. Mild gaseous distention of bowel in the upper abdomen. IMPRESSION: NG tube slightly advanced with the tip in the proximal stomach and the side port remaining in the distal esophagus. Increasing gaseous distention of the stomach.  Electronically Signed   By: Rolm Baptise M.D.   On: 03/15/2021 10:07   DG Abd Portable 1V  Result Date: 03/15/2021 CLINICAL DATA:  Confirm NG tube placement EXAM: PORTABLE ABDOMEN - 1 VIEW COMPARISON:  Abdominal radiographs obtained earlier the same day FINDINGS: The enteric catheter tip projects over the stomach, but the side hole projects over the distal esophagus. This should be advanced by proximally 6-7 cm. A central venous catheter tip is seen at the level of the superior cavoatrial junction. Gaseous distention of the imaged bowel in the upper abdomen is similar to the prior study. There is a small right pleural effusion with adjacent airspace disease. There is no acute osseous abnormality. IMPRESSION: 1. Recommend advancement of the enteric catheter approximately 6-7 cm for more optimal positioning. 2. Gaseous distention of the imaged bowel in the upper abdomen is similar to the prior study. 3. Small right pleural effusion with adjacent airspace disease. These results will be called to the ordering clinician or representative by the Radiologist Assistant, and communication documented in the PACS or [employer]. Electronically Signed   By: Valetta Mole M.D.   On: 03/15/2021 08:45   DG Abd Portable 1V  Result Date: 03/15/2021 CLINICAL DATA:  Bowel obstruction. Nausea and vomiting. Recent colectomy. EXAM: PORTABLE ABDOMEN - 1 VIEW COMPARISON:  03/11/2021 FINDINGS: The enteric tube overlying the stomach on the prior study is no  longer visualized and may have been removed. A drain overlies the lower abdomen/pelvis. There is moderate gaseous distension of the stomach, and there is mild gaseous distension of multiple small bowel loops. Gas is also present in the colon. No acute osseous abnormality is seen. IMPRESSION: Mild small bowel dilatation which may reflect ileus or possibly partial obstruction. Electronically Signed   By: Logan Bores M.D.   On: 03/15/2021 06:54     Anti-infectives: Anti-infectives (From admission, onward)    Start     Dose/Rate Route Frequency Ordered Stop   03/09/21 0800  piperacillin-tazobactam (ZOSYN) IVPB 3.375 g        3.375 g 12.5 mL/hr over 240 Minutes Intravenous Every 8 hours 03/09/21 0706 04/04/21 0759   02/26/21 0100  doxycycline (VIBRAMYCIN) 100 mg in sodium chloride 0.9 % 250 mL IVPB        100 mg 125 mL/hr over 120 Minutes Intravenous 2 times daily 02/26/21 0005 03/08/21 2359   02/20/21 0300  piperacillin-tazobactam (ZOSYN) IVPB 3.375 g        3.375 g 12.5 mL/hr over 240 Minutes Intravenous Every 8 hours 02/19/21 1907 03/08/21 2359   02/19/21 1900  ceFEPIme (MAXIPIME) 2 g in sodium chloride 0.9 % 100 mL IVPB  Status:  Discontinued        2 g 200 mL/hr over 30 Minutes Intravenous  Once 02/19/21 1857 02/19/21 1906   02/19/21 1900  metroNIDAZOLE (FLAGYL) IVPB 500 mg  Status:  Discontinued        500 mg 100 mL/hr over 60 Minutes Intravenous  Once 02/19/21 1857 02/19/21 1906   02/19/21 1815  piperacillin-tazobactam (ZOSYN) IVPB 3.375 g        3.375 g 100 mL/hr over 30 Minutes Intravenous  Once 02/19/21 1809 02/19/21 1944        Assessment/Plan POD 18 s/p ex lap, sigmoid colectomy, end colostomy with enterorrhaphy and drainage of intraabdominal abscess - Dr. Kieth Brightly and Dr. Radene Knee 9/10 for Acute sigmoid diverticulitis with contained perforation and abscess - CT 9/16 demonstrates heterogenous left iliopsoas collection likely hematoma, stranding in the retroperitoneum and subcu tissue.  Distal colon appears decompressed and very small caliber but there is contrast entering this area; there is additionally a similarly small-caliber segment of colon in the proximal transverse and the ascending colon is not dilated- suspect over all this represents small bowel/gastric ileus.  IR does not recommend draining the hematoma at this point. ID following and recommending Zosyn through 10/17 w/ repeat CT in 2 weeks - WBC  normalized. Consider CT earlier than 2 weeks if struggling with bowel function  - NGT replaced yesterday, pt with persistent ileus  - CT 9/27 with continued complex fluid collection along left psoas, SBO with transition in mid jejunum, post-surgical changes of colon resection with colostomy - will ask IR to evaluate L psoas collection for possible drainage  - patient continues to have stool output from colostomy that is thick and mucus like almost  - Was started on reglan last weekend. Discussed with him the risk of tardive dyskinesia/EPS on 9/20. Patient stated understanding and is okay with continuing Reglan.  - surgical drain with SS output - will discuss possible removal with MD - WOCN following for new ostomy teaching and vac changes - Cont TPN - Encouraged ambulation (working with therapies), use IS, multimodal pain control  - Cont abx per ID recs   FEN - NPO, NGT to LIWS, TPN VTE - SCDs, therapeutic lovenox  ID - Zosyn 9/4 -  10/17 (per ID) Foley - out, voiding.    Per primary: A fib - cards following  AKI - resolved ABL anemia -  stable HTN ETOH use L knee pain - per ortho  LOS: 24 days    Norm Parcel, [facility] 03/16/2021, 9:46 AM Please see Amion for pager number during day hours 7:00am-4:30pm

## 2021-03-16 NOTE — Progress Notes (Signed)
OT Cancellation Note  Patient Details Name: Travis Conrad MRN: [id number] DOB: 07-25-1950   Cancelled Treatment:    Reason Eval/Treat Not Completed: Medical issues which prohibited therapy Patient declined therapy earlier today and continues to have medical issues due to NG tube and possible blockage.   Lodema Hong, OTA  Trixie Dredge 03/16/2021, 2:13 PM

## 2021-03-16 NOTE — Progress Notes (Signed)
Secure chat to Dr. Doristine Bosworth -  Considering acuteness of current situation and DC of PO amiodarone.Travis KitchenMarland KitchenShould we place this pt back on a tele monitor?

## 2021-03-16 NOTE — Progress Notes (Signed)
Request to IR for possible left psoas collection aspiration/drain placement. Patient was previously reviewed for same on 03/05/21 and this collection was felt at that time to be consistent with a hematoma and did not require percutaneous aspiration/drainage.  CT abd/pelvis w/contrast from yesterday was reviewed by IR attending, Dr. Anselm Pancoast, who does not note that the collection has enlarged from the previous CT and there is no gas component. As such we do not recommend aspiration/drainage at this time.  Above was relayed to CCS PA via Epic secure chat.   IR remains available as needed, please call with questions or concerns.  Candiss Norse, PA-C

## 2021-03-16 NOTE — Progress Notes (Signed)
Pt called out experiencing "shivers and cold chills", This RN assessed strong tremors and pt was very anxious, HR was elevated. Vitals were stable including temp of 98.9. Pt was administered meds-see MAR. Warm blanket was placed and HOB increased due to strong feelings of nausea. Pt denied chest pain or discomfort, dizziness or faintness. This RN left patient resting comfortably with nausea and shivers subsiding. Pt reported one incident of this prior to hospitalization that was short lived and etiology not identified.

## 2021-03-16 NOTE — Progress Notes (Signed)
Secure chat to Dow Adolph -  there are a lot of meds via tube and some even oral. Im hesitant to give any of these because the tube isn't working and then I know that we can give meds via tube but the suction must be off for 2 hours post med administration. So I'm weary of having his suction turned off for, essentially 6 hours of the next 12. Is that the way they've been administering meds for him? He's also incredibly thirsty but I suppose he's strict NPO at this point. If I get his tube working, can he have ice chips?

## 2021-03-16 NOTE — Progress Notes (Addendum)
PHARMACY - TOTAL PARENTERAL NUTRITION CONSULT NOTE  Indication: Prolonged ileus  Patient Measurements: Height: 6' (182.9 cm) Weight: 98.9 kg (218 lb 0.6 oz) IBW/kg (Calculated) : 77.6 TPN AdjBW (KG): 82.6 Body mass index is 29.57 kg/m.  Assessment:  4 YOM with lower abdominal pain/N/V found to have perforated diverticulitis. CT abd on 9/3 found to have active ileus/pSBO from diverticulitis/bowel inflammation. Pharmacy consulted to manage TPN for prolonged ileus. Of note patient has a history pf alcohol dependency and is at high risk for refeeding.   Glucose / Insulin: no hx DM, A1c 5.9% - CBGs < 180.  Used 15 units SSI in past 24 hrs, 15 units insulin in TPN Electrolytes: Na stable at 138 (max in TPN), K 3.6 (goal >/= 4 with ileus), others WNL Renal: SCr up slightly to 1.04, BUN WNL Hepatic: LFTs / tbili / TG WNL, albumin 1.6 Intake / Output; MIVF: NG O/P 1000 mL, drain O/P 68mL, none from colostomy, UOP 0.52ml/kg/hr, LBM 9/6, vomited 9/27  GI Meds: IV Reglan/6hr, PPI IV/24h ID: Zosyn through 88/50/27 for complicated diverticulitis with IA abscess GI Imaging:  - 9/3 CT abd: Active ileus/PSBO - 9/16 CT abd: L-iliopsoas fluid collection (abscess or hematoma), resolution of prior diverticular abscess, still w/ evidence of ileus GI Surgeries / Procedures:  - 9/10 ex-lap, sigmoid colectomy w/ end colostomy creation, enterorrhaphy, drainage of intra-abd abscess, wound vac placement  Central access: PICC line 02/24/21 TPN start date: 02/25/21  Nutritional Goals, RD Estimated Needs Total Energy Estimated Needs: 2000-2200 Total Protein Estimated Needs: 100-110 grams Total Fluid Estimated Needs: >2L  Current Nutrition:  TPN CLD started 9/23 > NPO on 9/27 with NG to LIWS   Plan:  Continue TPN at goal rate 85 ml/hr to provide 102g AA, 367g CHO and 45g ILE for a total of 2104 kCal, meeting 100% of patient's needs Electrolytes in TPN: Na 141mEq/L, Increase K to 55mEq/L, Ca 2.16mEq/L, Mg  59mEq/L, Phos 10mmol/L, Cl:Ac 1:1 - no change today 9/27 Add standard MVI, trace elements, folate and thiamine to TPN Continue moderate SSI Q4H and increase back to 15 units regular insulin in TPN  Standard TPN labs on Mon and Thurs. Watch CBGs. KCl 10 mEq x 2 runs   Albertina Parr, PharmD., BCPS, BCCCP Clinical Pharmacist Please refer to [facility] for unit-specific pharmacist

## 2021-03-16 NOTE — Progress Notes (Signed)
PT Cancellation Note  Patient Details Name: Travis Conrad MRN: [id number] DOB: 25-Jun-1950   Cancelled Treatment:    Reason Eval/Treat Not Completed: Other (comment). Pt with slight medical decline. Pt had to have NG tube re-placed yesterday and may have more blockage. RN and wife asked to hold today. Acute PT to return as able, as appropriate to progress mobility.   Kittie Plater, PT, DPT Acute Rehabilitation Services Pager #: [phone] Office #: [phone] ]   Travis Conrad 03/16/2021, 12:01 PM

## 2021-03-16 NOTE — Consult Note (Signed)
WOC Nurse wound follow up Patient receiving care in [facility] 2W01 Kelly Johnson, PA-C with CCS present for dressing change.  Wound type: Surgical midline incision Wound bed: Clean, pink granulation tissue Drainage (amount, consistency, odor) Sanguinous in canister Periwound: intact Dressing procedure/placement/frequency: One piece of black foam dressing removed. One piece of black foam replaced. Drape applied, immediate seal obtained at 125 mmHg WOC to follow M/W/F   WOC Nurse ostomy follow up Stoma type/location: LUQ colostomy Stomal assessment/size: 1 3/8" red budded just above the level of the skin. Sutures in place.  Peristomal assessment: intact Treatment options for stomal/peristomal skin: Barrier ring Output: emptied 125 cc of dark green/black sticky stool. Ostomy pouching: 1pc. convex Education provided: Wife not present yet for today's pouch change. Needed to change early today for the PA to see his wound. Enrolled patient in [employer] Discharge program: Yes (previously) Wife states SS kit received.    L. , MSN, RN, CMSRN, AGCNS, WTA Wound Treatment Associate Pager [phone]  

## 2021-03-16 NOTE — Progress Notes (Addendum)
PROGRESS NOTE    Travis Conrad  DUK:[id number] DOB: [birth year] DOA: 02/19/2021 PCP: [name], MD    Brief Narrative:  Travis Conrad was admitted to the hospital with the working diagnosis of severe sepsis due to perforated diverticulitis/ intra-abdominal abscess. Now sp lapartomy, with colectomy, complicated with postoperative ileus.   70 year old male past medical history for atrial fibrillation, dyslipidemia, hypertension and diverticulosis who presented with nausea, vomiting and abdominal pain.  Reported several days of worse abdominal pain, and poor oral intake.  Because of severe symptoms he presented to the hospital.  On his initial physical examination his blood pressure was 84/65, heart rate 57, respiratory rate 20, oxygen saturation 91%, he was ill-looking appearing, heart S1-S2, present, irregular irregular, lungs clear to auscultation, abdomen tender to palpation in the mid abdomen, no lower extremity edema.   CT of the abdomen and pelvis with acute sigmoid diverticulitis with small extraluminal gas and fluid collection in the sigmoid mesocolon, 3.5 x 1.5 x 2.7 cm consistent with perforation and abscess.  Dilated proximal and decompressed distal small bowel loops, possible ileus.   9/4 developed atrial fibrillation with rapid ventricular response, required intravenous amiodarone infusion.   Follow-up CT 9/7 with increased size pelvic abscess. On 9/8 patient underwent CT-guided drainage of pelvic abscess.   09/10 Patient underwent exploratory laparotomy with sigmoid colectomy and diverting colostomy placement.  Postoperative ileus.   Urology and ID have been consulted for epididymitis.   9/19 Postoperative anemia: Required 1 unit packed red blood cells.    Positive left knee pain, positive effusion, underwent left knee aspiration and injection with good toleration.  Fluid with no crystals, gram stain no organisms.   Slowly resolving ileus. Patient had NG tube clamped but  patient vomiting, needing to reconnect tube to suction.     09/26 NG tube has been removed.   09/27 patient with vomiting and abdominal distention, abdominal radiographs with recurrent ileus, NG tube has replaced and connected to low intermittent suction.    Assessment & Plan:   Principal Problem:   Diverticulitis of large intestine with perforation and abscess without bleeding Active Problems:   Diverticulitis of intestine with abscess   Atrial fibrillation, chronic (HCC)   Essential hypertension   Diverticulitis   AKI (acute kidney injury) ([place])   Severe sepsis with acute organ dysfunction (HCC)   Intra-abdominal abscess (HCC)   Epididymitis   Severe sepsis due to perforated diverticulitis, sp colectomy. Left psoas collection. Post operative ileus.  Patient with persistent/ recurrent ileus, NG tube was replaced 03/14/2021.  Ileus managed by general surgery.  CT 03/15/2021 shows continued complex fluid collection along left iliopsoas.  SBO with transition in mid jejunum.  Surgery consulting IR to evaluate for drainage of the left psoas abscess.  Had low-grade fever of 100.5 this morning.  He is on Zosyn.  No abdominal pain.   2. Post operative anemia. sp 1 unit PRBC on 03/07/2021. Hemoglobin remains stable.  Monitor closely   3. Paroxysmal atrial fibrillation/ HTN. On amiodarone and metoprolol with good rate control. On enoxaparin for anticoagulation, holding DOAC until po intake more consistent.  General surgery does not want him to take any enteral medications either.  We will need to discontinue amiodarone and switch metoprolol to IV and hope that his heart rate will remain under control.   4. Right epididymitis resolved, completed therapy with doxycycline.    5. AKI with hypokalemia/ hyperkalemia,hyponatremia and hypophosphatemia.  Continue TPN per pharmacy protocol.  All electrolytes normal with AKI  resolved.   6. Alcohol withdrawal. Resolved.    7. Left knee arthritis.  Clinically improved after steroid injection.  Pain is well controlled.   8.  Essential hypertension: Controlled, will need to discontinue amlodipine per surgery recommendation.  Status is: Inpatient  Remains inpatient appropriate because:Inpatient level of care appropriate due to severity of illness  Dispo: The patient is from: Home              Anticipated d/c is to: Home              Patient currently is not medically stable to d/c.   Difficult to place patient No   DVT prophylaxis: Enoxxaparin   Code Status:    full  Family Communication:  None at bedside.  Plan discussed with patient in length.    Nutrition Status: Nutrition Problem: Inadequate oral intake Etiology: acute illness (diverticulitis) Signs/Symptoms: per patient/family report, NPO status Interventions: Prostat, Boost Plus    Consultants:  Surgery   Subjective: Seen and examined.  Only complaint he had was nausea.  No vomiting or abdominal pain.  Passing flatus.  Objective: Vitals:   03/15/21 2217 03/15/21 2324 03/16/21 0343 03/16/21 0722  BP: 118/74 110/61 104/73 121/62  Pulse: (!) 132 (!) 103 98 100  Resp: 20 20 20 18   Temp: (!) 100.5 F (38.1 C) 99.3 F (37.4 C) 98.1 F (36.7 C) 98.5 F (36.9 C)  TempSrc: Oral Oral Oral Oral  SpO2: 93% 94% 95% 96%  Weight:   98.9 kg   Height:        Intake/Output Summary (Last 24 hours) at 03/16/2021 1032 Last data filed at 03/16/2021 0300 Gross per 24 hour  Intake --  Output 1575 ml  Net -1575 ml    Filed Weights   03/07/21 0342 03/10/21 0447 03/16/21 0343  Weight: 94 kg 102 kg 98.9 kg    Examination:   General exam: Appears deconditioned but otherwise alert and oriented. Respiratory system: Clear to auscultation. Respiratory effort normal. Cardiovascular system: S1 & S2 heard, RRR. No JVD, murmurs, rubs, gallops or clicks. No pedal edema. Gastrointestinal system: Abdomen is slightly distended soft and nontender. No organomegaly or masses felt.  Normal bowel sounds heard. Central nervous system: Alert and oriented. No focal neurological deficits. Extremities: Symmetric 5 x 5 power. Skin: No rashes, lesions or ulcers.  Psychiatry: Judgement and insight appear normal. Mood & affect appropriate.    Data Reviewed: I have personally reviewed following labs and imaging studies  CBC: Recent Labs  Lab 03/10/21 0512 03/11/21 0159 03/13/21 0209 03/14/21 0636 03/16/21 0920  WBC 14.3* 11.9* 7.7 7.4 5.1  NEUTROABS  --   --   --   --  PENDING  HGB 8.4* 7.9* 8.1* 8.8* 8.1*  HCT 24.9* 23.2* 25.3* 26.3* 26.7*  MCV 101.2* 101.3* 103.3* 101.5* 112.7*  PLT 413* 415* 406* 383 637    Basic Metabolic Panel: Recent Labs  Lab 03/10/21 0512 03/12/21 0407 03/14/21 0636 03/16/21 0154  NA 132* 135 137 138  K 4.7 3.6 4.0 3.6  CL 102 105 105 107  CO2 24 24 26 26   GLUCOSE 129* 120* 148* 158*  BUN 25* 23 22 23   CREATININE 0.90 0.85 0.88 1.04  CALCIUM 8.6* 8.0* 8.0* 7.9*  MG 2.1  --  2.1 2.1  PHOS 4.3 3.4 3.3 3.6    GFR: Estimated Creatinine Clearance: 80.5 mL/min (by C-G formula based on SCr of 1.04 mg/dL). Liver Function Tests: Recent Labs  Lab 03/10/21 8588 03/14/21  0636  AST 20 16  ALT 20 18  ALKPHOS 117 84  BILITOT 1.1 0.6  PROT 5.5* 5.3*  ALBUMIN 1.7* 1.6*    No results for input(s): LIPASE, AMYLASE in the last 168 hours. No results for input(s): AMMONIA in the last 168 hours. Coagulation Profile: No results for input(s): INR, PROTIME in the last 168 hours. Cardiac Enzymes: No results for input(s): CKTOTAL, CKMB, CKMBINDEX, TROPONINI in the last 168 hours. BNP (last 3 results) No results for input(s): PROBNP in the last 8760 hours. HbA1C: No results for input(s): HGBA1C in the last 72 hours. CBG: Recent Labs  Lab 03/15/21 1623 03/15/21 2216 03/16/21 0213 03/16/21 0324 03/16/21 0724  GLUCAP 129* 149* 157* 154* 133*    Lipid Profile: Recent Labs    03/14/21 0636  TRIG 68    Thyroid Function Tests: No  results for input(s): TSH, T4TOTAL, FREET4, T3FREE, THYROIDAB in the last 72 hours. Anemia Panel: No results for input(s): VITAMINB12, FOLATE, FERRITIN, TIBC, IRON, RETICCTPCT in the last 72 hours.    Radiology Studies: I have reviewed all of the imaging during this hospital visit personally     Scheduled Meds:  (feeding supplement) PROSource Plus  30 mL Oral BID BM   amiodarone  200 mg Per NG tube BID   amLODipine  10 mg Per Tube Daily   Chlorhexidine Gluconate Cloth  6 each Topical Daily   enoxaparin (LOVENOX) injection  100 mg Subcutaneous Q12H   feeding supplement  1 Container Oral TID BM   insulin aspart  0-15 Units Subcutaneous Q4H   lidocaine  1 patch Transdermal Q24H   melatonin  3 mg Per Tube QHS   metoCLOPramide (REGLAN) injection  10 mg Intravenous Q6H   metoprolol tartrate  100 mg Per Tube BID   nicotine  21 mg Transdermal Daily   pantoprazole sodium  40 mg Per Tube BID   sodium chloride flush  10 mL Intracatheter Q12H   sucralfate  1 g Per NG tube TID WC & HS   Continuous Infusions:  sodium chloride Stopped (03/05/21 0320)   piperacillin-tazobactam (ZOSYN)  IV 3.375 g (03/16/21 0914)   potassium chloride     TPN ADULT (ION) 85 mL/hr at 03/15/21 1805   TPN ADULT (ION)       LOS: 24 days   Total time spent in minutes: 31 Darliss Cheney, MD [facility] Hospitalist

## 2021-03-17 ENCOUNTER — Inpatient Hospital Stay (HOSPITAL_COMMUNITY): Payer: Medicare Other

## 2021-03-17 DIAGNOSIS — K5720 Diverticulitis of large intestine with perforation and abscess without bleeding: Secondary | ICD-10-CM | POA: Diagnosis not present

## 2021-03-17 LAB — GLUCOSE, CAPILLARY
Glucose-Capillary: 116 mg/dL — ABNORMAL HIGH (ref 70–99)
Glucose-Capillary: 119 mg/dL — ABNORMAL HIGH (ref 70–99)
Glucose-Capillary: 127 mg/dL — ABNORMAL HIGH (ref 70–99)
Glucose-Capillary: 131 mg/dL — ABNORMAL HIGH (ref 70–99)
Glucose-Capillary: 135 mg/dL — ABNORMAL HIGH (ref 70–99)
Glucose-Capillary: 138 mg/dL — ABNORMAL HIGH (ref 70–99)
Glucose-Capillary: 141 mg/dL — ABNORMAL HIGH (ref 70–99)

## 2021-03-17 LAB — COMPREHENSIVE METABOLIC PANEL
ALT: 30 U/L (ref 0–44)
AST: 30 U/L (ref 15–41)
Albumin: 1.5 g/dL — ABNORMAL LOW (ref 3.5–5.0)
Alkaline Phosphatase: 90 U/L (ref 38–126)
Anion gap: 7 (ref 5–15)
BUN: 24 mg/dL — ABNORMAL HIGH (ref 8–23)
CO2: 25 mmol/L (ref 22–32)
Calcium: 7.8 mg/dL — ABNORMAL LOW (ref 8.9–10.3)
Chloride: 109 mmol/L (ref 98–111)
Creatinine, Ser: 1.15 mg/dL (ref 0.61–1.24)
GFR, Estimated: >60 mL/min (ref >60)
Glucose, Bld: 144 mg/dL — ABNORMAL HIGH (ref 70–99)
Potassium: 3.7 mmol/L (ref 3.5–5.1)
Sodium: 141 mmol/L (ref 135–145)
Total Bilirubin: 0.2 mg/dL — ABNORMAL LOW (ref 0.3–1.2)
Total Protein: 5.1 g/dL — ABNORMAL LOW (ref 6.5–8.1)

## 2021-03-17 LAB — CBC
HCT: 25.5 % — ABNORMAL LOW (ref 39.0–52.0)
Hemoglobin: 8.3 g/dL — ABNORMAL LOW (ref 13.0–17.0)
MCH: 32.9 pg (ref 26.0–34.0)
MCHC: 32.5 g/dL (ref 30.0–36.0)
MCV: 101.2 fL — ABNORMAL HIGH (ref 80.0–100.0)
Platelets: 244 10*3/uL (ref 150–400)
RBC: 2.52 MIL/uL — ABNORMAL LOW (ref 4.22–5.81)
RDW: 14.3 % (ref 11.5–15.5)
WBC: 5.8 10*3/uL (ref 4.0–10.5)
nRBC: 0 % (ref 0.0–0.2)

## 2021-03-17 LAB — MAGNESIUM: Magnesium: 2.1 mg/dL (ref 1.7–2.4)

## 2021-03-17 LAB — PHOSPHORUS: Phosphorus: 3.1 mg/dL (ref 2.5–4.6)

## 2021-03-17 MED ORDER — DIPHENHYDRAMINE HCL 50 MG/ML IJ SOLN
50.0000 mg | Freq: Once | INTRAMUSCULAR | Status: AC
  Administered 2021-03-18: 50 mg via INTRAVENOUS
  Filled 2021-03-17: qty 1

## 2021-03-17 MED ORDER — ACETAMINOPHEN 10 MG/ML IV SOLN
1000.0000 mg | Freq: Four times a day (QID) | INTRAVENOUS | Status: AC
  Administered 2021-03-17 – 2021-03-18 (×4): 1000 mg via INTRAVENOUS
  Filled 2021-03-17 (×4): qty 100

## 2021-03-17 MED ORDER — TRAVASOL 10 % IV SOLN
INTRAVENOUS | Status: AC
  Filled 2021-03-17: qty 1020

## 2021-03-17 MED ORDER — METHOCARBAMOL 1000 MG/10ML IJ SOLN
500.0000 mg | Freq: Three times a day (TID) | INTRAVENOUS | Status: DC
  Administered 2021-03-17 (×2): 500 mg via INTRAVENOUS
  Filled 2021-03-17: qty 500
  Filled 2021-03-17 (×2): qty 5

## 2021-03-17 MED ORDER — POTASSIUM CHLORIDE 10 MEQ/50ML IV SOLN
10.0000 meq | INTRAVENOUS | Status: AC
  Administered 2021-03-17 (×2): 10 meq via INTRAVENOUS
  Filled 2021-03-17 (×2): qty 50

## 2021-03-17 MED ORDER — DIPHENHYDRAMINE HCL 50 MG/ML IJ SOLN
50.0000 mg | Freq: Once | INTRAMUSCULAR | Status: AC
  Administered 2021-03-17: 50 mg via INTRAVENOUS
  Filled 2021-03-17: qty 1

## 2021-03-17 NOTE — Progress Notes (Signed)
Physical Therapy Treatment Patient Details Name: Travis Conrad MRN: [id number] DOB: 1950/11/06 Today's Date: 03/17/2021   History of Present Illness 70 yo admitted 9/3 with abdominal pain, vomiting and ileus. 9/4 Afib with RVR. 9/8 intrabdominal abscess s/p IR drain placed. 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 Pt found to have lt iliopsoas hematoma on CT 9/16.  9/20 left knee aspiration. 9/26 NG tube removed. 9/27 NGT replaced due to vomiting. PMhx: HTN, HLD, Afib, ETOH use    PT Comments    Pt reports increased fatigue after having not slept all night. Pt with increased dizziness with transfers with bP stable and limited gait and activity tolerance this session. Pt with improved bed mobility and willingness to progress toward home mobility. Wife present during session. Encouraged up to chair for 1hr at a time, geomat present.  Will continue to follow.     Recommendations for follow up therapy are one component of a multi-disciplinary discharge planning process, led by the attending physician.  Recommendations may be updated based on patient status, additional functional criteria and insurance authorization.  Follow Up Recommendations  Supervision for mobility/OOB;Home health PT     Equipment Recommendations  None recommended by PT    Recommendations for Other Services       Precautions / Restrictions Precautions Precautions: Fall Precaution Comments: wound vAC, jp drain, NGT Restrictions Weight Bearing Restrictions: No     Mobility  Bed Mobility Overal bed mobility: Needs Assistance Bed Mobility: Rolling;Sidelying to Sit Rolling: Supervision Sidelying to sit: Supervision       General bed mobility comments: supervision for lines, reliant on rail to roll to left and rise from side. increased time for scooting to EOB with dizziness BP 113/46 (67)    Transfers Overall transfer level: Needs assistance    Transfers: Sit to/from Stand Sit to Stand: Min guard         General transfer comment: cues for hand placement. Pt rose from elevated bed but unable to maintain standing and return to sitting due to dizziness BP 136/59 (85) prior to 2nd stand. Pt sat in chair during ambulation and additional stand end of session for standing HEP but pt unable to tolerate and returned to sitting  Ambulation/Gait Ambulation/Gait assistance: Min guard Gait Distance (Feet): 120 Feet Assistive device: Rolling walker (2 wheeled) Gait Pattern/deviations: Trunk flexed;Decreased stride length;Step-through pattern   Gait velocity interpretation: 1.31 - 2.62 ft/sec, indicative of limited community ambulator General Gait Details: pt with improved posture with cues x 3 for fully erect stance, reliant on RW and pt needed chair pulled to him after 120' today with seated rest prior to return 120' to room   Stairs             Wheelchair Mobility    Modified Rankin (Stroke Patients Only)       Balance Overall balance assessment: Needs assistance Sitting-balance support: Feet supported;No upper extremity supported Sitting balance-Leahy Scale: Good Sitting balance - Comments: Able to maintain without UE support   Standing balance support: Bilateral upper extremity supported;During functional activity Standing balance-Leahy Scale: Poor Standing balance comment: Reliant on RW in standing                            Cognition Arousal/Alertness: Awake/alert Behavior During Therapy: WFL for tasks assessed/performed Overall Cognitive Status: Impaired/Different from baseline Area of Impairment: Memory  Memory: Decreased short-term memory         General Comments: HOH      Exercises General Exercises - Lower Extremity Long Arc Quad: AROM;Both;Seated;20 reps Hip Flexion/Marching: AROM;Both;10 reps;Seated    General Comments        Pertinent Vitals/Pain  Pain Score: 6  Pain Location: abdomen Pain Descriptors / Indicators: Aching;Guarding Pain Intervention(s): Limited activity within patient's tolerance;Monitored during session;Repositioned;Premedicated before session    Home Living                      Prior Function            PT Goals (current goals can now be found in the care plan section) Progress towards PT goals: Progressing toward goals    Frequency    Min 3X/week      PT Plan Current plan remains appropriate    Co-evaluation              AM-PAC PT "6 Clicks" Mobility   Outcome Measure  Help needed turning from your back to your side while in a flat bed without using bedrails?: None Help needed moving from lying on your back to sitting on the side of a flat bed without using bedrails?: None Help needed moving to and from a bed to a chair (including a wheelchair)?: A Little Help needed standing up from a chair using your arms (e.g., wheelchair or bedside chair)?: A Little Help needed to walk in hospital room?: A Little Help needed climbing 3-5 steps with a railing? : A Lot 6 Click Score: 19    End of Session   Activity Tolerance: Patient limited by fatigue Patient left: in chair;with call bell/phone within reach;with family/visitor present Nurse Communication: Mobility status PT Visit Diagnosis: Other abnormalities of gait and mobility (R26.89);Difficulty in walking, not elsewhere classified (R26.2)     Time: 3662-9476 PT Time Calculation (min) (ACUTE ONLY): 36 min  Charges:  $Gait Training: 8-22 mins $Therapeutic Activity: 8-22 mins                     Jaynia Fendley P, PT Acute Rehabilitation Services Pager: [phone] Office: [place] 03/17/2021, 11:09 AM

## 2021-03-17 NOTE — Progress Notes (Signed)
Progress Note  19 Days Post-Op  Subjective: Patient reports pain with mobilization. Having output from stoma. Denies ambulating yesterday.   Objective: Vital signs in last 24 hours: Temp:  [98.2 F (36.8 C)-99.5 F (37.5 C)] 98.2 F (36.8 C) (09/28 2000) Pulse Rate:  [98-107] 107 (09/28 2003) Resp:  [18] 18 (09/28 2000) BP: (115-122)/(54-78) 121/70 (09/28 2003) SpO2:  [96 %] 96 % (09/28 2003) Last BM Date: 03/14/21  Intake/Output from previous day: 09/28 0701 - 09/29 0700 In: 833.9 [I.V.:577.9; IV Piggyback:255.9] Out: 775 [Urine:775] Intake/Output this shift: Total I/O In: -  Out: 1000 [Urine:1000]  PE: Gen:  Alert, NAD, pleasant Pulm:  Normal rate and effort. Abd: Soft, ND, diffuse mild ttp without peritonitis.  Drain w/ SS output. Stoma appears pink and viable. Thick stool in colostomy bag. midline wound with VAC present; NGT with thin drainage   Lab Results:  Recent Labs    03/16/21 0920 03/17/21 0442  WBC 5.1 5.8  HGB 8.1* 8.3*  HCT 26.7* 25.5*  PLT 278 244   BMET Recent Labs    03/16/21 0154 03/17/21 0442  NA 138 141  K 3.6 3.7  CL 107 109  CO2 26 25  GLUCOSE 158* 144*  BUN 23 24*  CREATININE 1.04 1.15  CALCIUM 7.9* 7.8*   PT/INR No results for input(s): LABPROT, INR in the last 72 hours. CMP     Component Value Date/Time   NA 141 03/17/2021 0442   NA 134 09/30/2019 0959   NA 142 03/03/2016 0921   K 3.7 03/17/2021 0442   K 4.3 03/03/2016 0921   CL 109 03/17/2021 0442   CO2 25 03/17/2021 0442   CO2 25 03/03/2016 0921   GLUCOSE 144 (H) 03/17/2021 0442   GLUCOSE 89 03/03/2016 0921   BUN 24 (H) 03/17/2021 0442   BUN 11 09/30/2019 0959   BUN 13.6 03/03/2016 0921   CREATININE 1.15 03/17/2021 0442   CREATININE 1.2 03/03/2016 0921   CALCIUM 7.8 (L) 03/17/2021 0442   CALCIUM 9.6 03/03/2016 0921   PROT 5.1 (L) 03/17/2021 0442   PROT 7.3 03/03/2016 0921   ALBUMIN 1.5 (L) 03/17/2021 0442   ALBUMIN 3.8 03/03/2016 0921   AST 30 03/17/2021  0442   AST 36 (H) 03/03/2016 0921   ALT 30 03/17/2021 0442   ALT 31 03/03/2016 0921   ALKPHOS 90 03/17/2021 0442   ALKPHOS 116 03/03/2016 0921   BILITOT 0.2 (L) 03/17/2021 0442   BILITOT 0.71 03/03/2016 0921   GFRNONAA >60 03/17/2021 0442   GFRNONAA 67 07/25/2013 1712   GFRAA >60 10/03/2019 0930   GFRAA 77 07/25/2013 1712   Lipase     Component Value Date/Time   LIPASE 22 02/19/2021 1653       Studies/Results: CT ABDOMEN PELVIS W CONTRAST  Result Date: 03/15/2021 CLINICAL DATA:  Bowel obstruction, nausea and vomiting, recent colectomy EXAM: CT ABDOMEN AND PELVIS WITH CONTRAST TECHNIQUE: Multidetector CT imaging of the abdomen and pelvis was performed using the standard protocol following bolus administration of intravenous contrast. CONTRAST:  38mL OMNIPAQUE IOHEXOL 350 MG/ML SOLN COMPARISON:  03/04/2021 FINDINGS: Lower chest: There are small bilateral pleural effusions, right greater than left. Dependent lower lobe atelectasis. No acute airspace disease or pneumothorax. Hepatobiliary: Minimal gallbladder sludge layering dependently. No calcified gallstones or cholecystitis. The liver is unremarkable. Pancreas: Unremarkable. No pancreatic ductal dilatation or surrounding inflammatory changes. Spleen: Normal in size without focal abnormality. 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  Right adrenals unremarkable. The kidneys enhance normally and symmetrically. No urinary tract calculi or obstructive uropathy. The bladder is unremarkable. Stomach/Bowel: Enteric catheter within the gastric lumen. Multiple dilated loops of proximal small bowel measuring up to 4.5 cm consistent with small-bowel obstruction. Transition within the distal jejunum and ileum, with decompression of the colon to the level of left lower quadrant colostomy. Postsurgical changes are seen from sigmoid colon resection. Vascular/Lymphatic: Aortic  atherosclerosis. No enlarged abdominal or pelvic lymph nodes. Reproductive: Prostate is unremarkable. Other: Surgical drain remains coiled within the lower pelvis. The complex fluid collection along the left psoas muscle again identified, measuring up to 8.1 by 4.9 by 2 13.3 cm, not significantly changed since prior study. This fluid collection demonstrates increased attenuation, and could reflect retracting postoperative hematoma or organizing phlegmon/abscess. No free intraperitoneal gas or free fluid. Postsurgical changes from midline laparotomy and left lower quadrant colostomy. Musculoskeletal: No acute or destructive bony lesions. Reconstructed images demonstrate no additional findings. IMPRESSION: 1. Continued retroperitoneal complex fluid collection along the anterior margin of the left psoas muscle. Differential includes organizing infection/abscess versus postoperative hematoma. 2. Small-bowel obstruction, transition in the mid jejunum. 3. Postsurgical changes from partial colon resection and left lower quadrant colostomy. 4. Small bilateral pleural effusions and dependent lower lobe atelectasis. 5.  Aortic Atherosclerosis (ICD10-I70.0). Electronically Signed   By: Randa Ngo M.D.   On: 03/15/2021 21:48   DG Abd Portable 1V-Small Bowel Obstruction Protocol-initial, 8 hr delay  Result Date: 03/16/2021 CLINICAL DATA:  Small-bowel obstruction EXAM: PORTABLE ABDOMEN - 1 VIEW COMPARISON:  03/16/2021 FINDINGS: Two supine frontal views of the abdomen and pelvis are obtained. Enteric catheter tip projects over the gastric body. Stable surgical drain lower abdomen. There is persistent gaseous distention of the small bowel within the central abdomen. Dilute oral contrast has progressed throughout the colon. No masses or abnormal calcifications. IMPRESSION: 1. Support devices as above. 2. Stable gaseous distention of the small bowel. Oral contrast has progressed into the colon, excluding high-grade  obstruction. Electronically Signed   By: Randa Ngo M.D.   On: 03/16/2021 21:58   DG Abd Portable 1V  Result Date: 03/16/2021 CLINICAL DATA:  NG-tube placement EXAM: PORTABLE ABDOMEN - 1 VIEW COMPARISON:  CT previous day FINDINGS: Enteric tube with tip in the gastric body. Diffuse air-filled loops of bowel, similar to recent CT. No abnormal calcification or unexpected radiopaque foreign body. The osseous structures are unremarkable. IMPRESSION: Enteric tube with tip in the gastric body. Electronically Signed   By: Albin Felling M.D.   On: 03/16/2021 12:34   DG Abd Portable 1V  Result Date: 03/15/2021 CLINICAL DATA:  NG tube placement EXAM: PORTABLE ABDOMEN - 1 VIEW COMPARISON:  03/15/2021 FINDINGS: Slight advancement of the NG tube with the tip just into the stomach. The side port remains in the distal esophagus. Increasing gaseous distention of the stomach. Mild gaseous distention of bowel in the upper abdomen. IMPRESSION: NG tube slightly advanced with the tip in the proximal stomach and the side port remaining in the distal esophagus. Increasing gaseous distention of the stomach. Electronically Signed   By: Rolm Baptise M.D.   On: 03/15/2021 10:07    Anti-infectives: Anti-infectives (From admission, onward)    Start     Dose/Rate Route Frequency Ordered Stop   03/09/21 0800  piperacillin-tazobactam (ZOSYN) IVPB 3.375 g        3.375 g 12.5 mL/hr over 240 Minutes Intravenous Every 8 hours 03/09/21 0706 04/04/21 0759   02/26/21 0100  doxycycline (VIBRAMYCIN) 100 mg in sodium chloride 0.9 % 250 mL IVPB        100 mg 125 mL/hr over 120 Minutes Intravenous 2 times daily 02/26/21 0005 03/08/21 2359   02/20/21 0300  piperacillin-tazobactam (ZOSYN) IVPB 3.375 g        3.375 g 12.5 mL/hr over 240 Minutes Intravenous Every 8 hours 02/19/21 1907 03/08/21 2359   02/19/21 1900  ceFEPIme (MAXIPIME) 2 g in sodium chloride 0.9 % 100 mL IVPB  Status:  Discontinued        2 g 200 mL/hr over 30 Minutes  Intravenous  Once 02/19/21 1857 02/19/21 1906   02/19/21 1900  metroNIDAZOLE (FLAGYL) IVPB 500 mg  Status:  Discontinued        500 mg 100 mL/hr over 60 Minutes Intravenous  Once 02/19/21 1857 02/19/21 1906   02/19/21 1815  piperacillin-tazobactam (ZOSYN) IVPB 3.375 g        3.375 g 100 mL/hr over 30 Minutes Intravenous  Once 02/19/21 1809 02/19/21 1944        Assessment/Plan POD 19 s/p ex lap, sigmoid colectomy, end colostomy with enterorrhaphy and drainage of intraabdominal abscess - Dr. Kieth Brightly and Dr. Radene Knee 9/10 for Acute sigmoid diverticulitis with contained perforation and abscess - CT 9/16 demonstrates heterogenous left iliopsoas collection likely hematoma, stranding in the retroperitoneum and subcu tissue.  Distal colon appears decompressed and very small caliber but there is contrast entering this area; there is additionally a similarly small-caliber segment of colon in the proximal transverse and the ascending colon is not dilated- suspect over all this represents small bowel/gastric ileus.  IR does not recommend draining the hematoma at this point. ID following and recommending Zosyn through 10/17  - NGT replaced 9/27  - CT 9/27 with continued complex fluid collection along left psoas, SBO with transition in mid jejunum, post-surgical changes of colon resection with colostomy - patient continues to have stool output from colostomy that is thick and mucus like almost, gastric motility seems to be the cause of nausea/vomiting at this point - Was started on reglan last weekend. Discussed with him the risk of tardive dyskinesia/EPS on 9/20. Patient stated understanding and is okay with continuing Reglan.  - start clamping trials with cyclic clamping - put back to suction at 1700 today and leave on suction until 0700 tomorrow AM and record drainage amount in that time frame; if patient becomes nauseated during clamping phase then place back on LIWS - pt may have ice chips and sips of  clears with NGT clamped - surgical drain with SS output - remove today  - WOCN following for new ostomy teaching and vac changes - Cont TPN - Encouraged ambulation (working with therapies), use IS, multimodal pain control  - Cont abx per ID recs   FEN - ok to have sips of clears and ice chips with NGT clamped, NGT clamping, TPN VTE - SCDs, therapeutic lovenox  ID - Zosyn 9/4 - 10/17 (per ID) Foley - out, voiding.    Per primary: A fib - cards following  AKI - resolved ABL anemia -  stable HTN ETOH use L knee pain - per ortho  LOS: 25 days    Norm Parcel, [facility] Surgery 03/17/2021, 9:49 AM Please see Amion for pager number during day hours 7:00am-4:30pm

## 2021-03-17 NOTE — TOC Progression Note (Signed)
Transition of Care [facility]) - Progression Note    Patient Details  Name: Travis Conrad MRN: [id number] Date of Birth: [birth year]  Transition of Care [facility] CM/SW Contact  Joanne Chars, LCSW Phone Number: 03/17/2021, 8:27 AM  Clinical Narrative:   CSW spoke with Emilio Aspen regarding wound vac.  Order has been approved, Olivia Mackie noted that pt is no longer scheduled to DC and did not deliver.  CSW will contact her when DC date is clear.      Expected Discharge Plan: [place] Barriers to Discharge: Continued Medical Work up  Expected Discharge Plan and Services Expected Discharge Plan: [place] In-house Referral: Clinical Social Work   Post Acute Care Choice: [place] arrangements for the past 2 months: Single Family Home                                       Social Determinants of Health (SDOH) Interventions    Readmission Risk Interventions No flowsheet data found.

## 2021-03-17 NOTE — Progress Notes (Addendum)
PHARMACY - TOTAL PARENTERAL NUTRITION CONSULT NOTE  Indication: Prolonged ileus  Patient Measurements: Height: 6' (182.9 cm) Weight: 98.9 kg (218 lb 0.6 oz) IBW/kg (Calculated) : 77.6 TPN AdjBW (KG): 82.6 Body mass index is 29.57 kg/m.  Assessment:  89 YOM with lower abdominal pain/N/V found to have perforated diverticulitis. CT abd on 9/3 found to have active ileus/pSBO from diverticulitis/bowel inflammation. Pharmacy consulted to manage TPN for prolonged ileus. Of note patient has a history pf alcohol dependency and is at high risk for refeeding.   Glucose / Insulin: no hx DM, A1c 5.9% - CBGs < 180.  Used 10 units SSI in past 24 hrs, 15 units insulin in TPN Electrolytes: Na stable at 141 (max in TPN), K 3.7 (goal >/= 4 with ileus), others WNL Renal: SCr up slightly to 1.15, BUN 24 Hepatic: LFTs / tbili / TG WNL, albumin 1.5 Intake / Output; MIVF: NG 0 mL, drain O/P 3mL, none from colostomy, UOP 0.50ml/kg/hr, LBM 9/6, vomited 9/27  GI Meds: IV Reglan/6hr, PPI IV/24h ID: Zosyn through 75/79/72 for complicated diverticulitis with IA abscess GI Imaging:  - 9/3 CT abd: Active ileus/PSBO - 9/16 CT abd: L-iliopsoas fluid collection (abscess or hematoma), resolution of prior diverticular abscess, still w/ evidence of ileus GI Surgeries / Procedures:  - 9/10 ex-lap, sigmoid colectomy w/ end colostomy creation, enterorrhaphy, drainage of intra-abd abscess, wound vac placement  Central access: PICC line 02/24/21 TPN start date: 02/25/21  Nutritional Goals, RD Estimated Needs Total Energy Estimated Needs: 2000-2200 Total Protein Estimated Needs: 100-110 grams Total Fluid Estimated Needs: >2L  Current Nutrition:  TPN CLD started 9/23 > NPO on 9/27 with NG to LIWS   Plan:  Continue TPN at goal rate 85 ml/hr to provide 102g AA, 367g CHO and 45g ILE for a total of 2104 kCal, meeting 100% of patient's needs Electrolytes in TPN: Increase K to 59mEq/L. Continue Na 150 mEq/L - may need to reduce  tomorrow. Continue Ca 2.55mEq/L, Mg 34mEq/L, Phos 67mmol/L, Cl:Ac 1:1  Add standard MVI, trace elements, folate and thiamine to TPN Continue moderate SSI Q4H and15 units regular insulin in TPN  Standard TPN labs on Mon and Thurs, repeat BMP tomorrow KCl 10 mEq x 2 runs   Cristela Felt, PharmD, BCPS Clinical Pharmacist 03/17/2021 7:21 AM

## 2021-03-17 NOTE — Progress Notes (Signed)
PROGRESS NOTE    Travis Conrad  IPJ:[id number] DOB: 1950-11-04 DOA: 02/19/2021 PCP: [name], MD    Brief Narrative:  [name] was admitted to the hospital with the working diagnosis of severe sepsis due to perforated diverticulitis/ intra-abdominal abscess. Now sp lapartomy, with colectomy, complicated with postoperative ileus.   70 year old male past medical history for atrial fibrillation, dyslipidemia, hypertension and diverticulosis who presented with nausea, vomiting and abdominal pain.  Reported several days of worse abdominal pain, and poor oral intake.  Because of severe symptoms he presented to the hospital.  On his initial physical examination his blood pressure was 84/65, heart rate 57, respiratory rate 20, oxygen saturation 91%, he was ill-looking appearing, heart S1-S2, present, irregular irregular, lungs clear to auscultation, abdomen tender to palpation in the mid abdomen, no lower extremity edema.   CT of the abdomen and pelvis with acute sigmoid diverticulitis with small extraluminal gas and fluid collection in the sigmoid mesocolon, 3.5 x 1.5 x 2.7 cm consistent with perforation and abscess.  Dilated proximal and decompressed distal small bowel loops, possible ileus.   9/4 developed atrial fibrillation with rapid ventricular response, required intravenous amiodarone infusion.   Follow-up CT 9/7 with increased size pelvic abscess. On 9/8 patient underwent CT-guided drainage of pelvic abscess.   09/10 Patient underwent exploratory laparotomy with sigmoid colectomy and diverting colostomy placement.  Postoperative ileus.   Urology and ID have been consulted for epididymitis.   9/19 Postoperative anemia: Required 1 unit packed red blood cells.    Positive left knee pain, positive effusion, underwent left knee aspiration and injection with good toleration.  Fluid with no crystals, gram stain no organisms.   Slowly resolving ileus. Patient had NG tube clamped but  patient vomiting, needing to reconnect tube to suction.     09/26 NG tube has been removed.   09/27 patient with vomiting and abdominal distention, abdominal radiographs with recurrent ileus, NG tube has replaced and connected to low intermittent suction.    Assessment & Plan:   Principal Problem:   Diverticulitis of large intestine with perforation and abscess without bleeding Active Problems:   Diverticulitis of intestine with abscess   Atrial fibrillation, chronic (HCC)   Essential hypertension   Diverticulitis   AKI (acute kidney injury) ([place])   Severe sepsis with acute organ dysfunction (HCC)   Intra-abdominal abscess (HCC)   Epididymitis   Severe sepsis due to perforated diverticulitis, sp colectomy. Left psoas collection. Post operative ileus.  Patient with persistent/ recurrent ileus, NG tube was replaced 03/14/2021.  Ileus managed by general surgery.  CT 03/15/2021 shows continued complex fluid collection along left iliopsoas.  SBO with transition in mid jejunum.  Surgery consulted IR to evaluate for drainage of the left psoas abscess however, IR do not recommend aspirating at this point in time.  He is on Zosyn.  Has some abdominal pain today.  Management per surgery.   2. Post operative anemia. sp 1 unit PRBC on 03/07/2021. Hemoglobin remains stable.  Monitor closely   3. Paroxysmal atrial fibrillation/ HTN. Patient was on amiodarone and metoprolol with good rate control. On enoxaparin for anticoagulation, holding DOAC until po intake more consistent.  Due to recurrent ileus, on 03/16/2021, general surgery recommended transitioning all oral or enteral medications to IV route.  Amiodarone and metoprolol was stopped and he was started on IV metoprolol 10 mg daily 8 hours.  Rates slightly elevated.  Will start on amiodarone infusion today.   4. Right epididymitis resolved, completed therapy  with doxycycline.    5. AKI with hypokalemia/ hyperkalemia,hyponatremia and  hypophosphatemia.  Continue TPN per pharmacy protocol.  All electrolytes normal with AKI resolved.   6. Alcohol withdrawal. Resolved.    7. Left knee arthritis. Clinically improved after steroid injection.  Pain is well controlled.   8.  Essential hypertension: Controlled.  Amlodipine discontinued due to recurrent ileus and surgery recommendations to withhold oral medications.  Status is: Inpatient  Remains inpatient appropriate because:Inpatient level of care appropriate due to severity of illness  Dispo: The patient is from: Home              Anticipated d/c is to: Home              Patient currently is not medically stable to d/c.   Difficult to place patient No   DVT prophylaxis: Enoxxaparin   Code Status:    full  Family Communication:  None at bedside.  Plan discussed with patient in length.    Nutrition Status: Nutrition Problem: Inadequate oral intake Etiology: acute illness (diverticulitis) Signs/Symptoms: per patient/family report, NPO status Interventions: Prostat, Boost Plus    Consultants:  Surgery   Subjective: Seen and examined.  Complains of mild abdominal pain and mild nausea.  Passing flatus.  No other complaint.  Objective: Vitals:   03/16/21 1507 03/16/21 1900 03/16/21 2000 03/16/21 2003  BP: (!) 115/54  121/70 121/70  Pulse:   (!) 107 (!) 107  Resp:   18   Temp:  98.9 F (37.2 C) 98.2 F (36.8 C)   TempSrc:  Oral Oral   SpO2:   96% 96%  Weight:      Height:        Intake/Output Summary (Last 24 hours) at 03/17/2021 0952 Last data filed at 03/17/2021 0935 Gross per 24 hour  Intake 833.85 ml  Output 1775 ml  Net -941.15 ml    Filed Weights   03/07/21 0342 03/10/21 0447 03/16/21 0343  Weight: 94 kg 102 kg 98.9 kg    Examination:  General exam: Appears calm and comfortable but deconditioned Respiratory system: Clear to auscultation. Respiratory effort normal. Cardiovascular system: S1 & S2 heard, RRR. No JVD, murmurs, rubs, gallops  or clicks. No pedal edema. Gastrointestinal system: Abdomen is nondistended, soft and slightly tender generalized. No organomegaly or masses felt. Normal bowel sounds heard. Central nervous system: Alert and oriented. No focal neurological deficits. Extremities: Symmetric 5 x 5 power. Skin: No rashes, lesions or ulcers.  Psychiatry: Judgement and insight appear normal. Mood & affect appropriate.    Data Reviewed: I have personally reviewed following labs and imaging studies  CBC: Recent Labs  Lab 03/11/21 0159 03/13/21 0209 03/14/21 0636 03/16/21 0920 03/17/21 0442  WBC 11.9* 7.7 7.4 5.1 5.8  NEUTROABS  --   --   --  4.1  --   HGB 7.9* 8.1* 8.8* 8.1* 8.3*  HCT 23.2* 25.3* 26.3* 26.7* 25.5*  MCV 101.3* 103.3* 101.5* 112.7* 101.2*  PLT 415* 406* 383 278 416    Basic Metabolic Panel: Recent Labs  Lab 03/12/21 0407 03/14/21 0636 03/16/21 0154 03/17/21 0442  NA 135 137 138 141  K 3.6 4.0 3.6 3.7  CL 105 105 107 109  CO2 24 26 26 25   GLUCOSE 120* 148* 158* 144*  BUN 23 22 23  24*  CREATININE 0.85 0.88 1.04 1.15  CALCIUM 8.0* 8.0* 7.9* 7.8*  MG  --  2.1 2.1 2.1  PHOS 3.4 3.3 3.6 3.1    GFR: Estimated  Creatinine Clearance: 72.8 mL/min (by C-G formula based on SCr of 1.15 mg/dL). Liver Function Tests: Recent Labs  Lab 03/14/21 0636 03/17/21 0442  AST 16 30  ALT 18 30  ALKPHOS 84 90  BILITOT 0.6 0.2*  PROT 5.3* 5.1*  ALBUMIN 1.6* 1.5*    No results for input(s): LIPASE, AMYLASE in the last 168 hours. No results for input(s): AMMONIA in the last 168 hours. Coagulation Profile: No results for input(s): INR, PROTIME in the last 168 hours. Cardiac Enzymes: No results for input(s): CKTOTAL, CKMB, CKMBINDEX, TROPONINI in the last 168 hours. BNP (last 3 results) No results for input(s): PROBNP in the last 8760 hours. HbA1C: No results for input(s): HGBA1C in the last 72 hours. CBG: Recent Labs  Lab 03/16/21 1603 03/16/21 2004 03/17/21 0018 03/17/21 0459  03/17/21 0743  GLUCAP 161* 149* 141* 138* 131*    Lipid Profile: No results for input(s): CHOL, HDL, LDLCALC, TRIG, CHOLHDL, LDLDIRECT in the last 72 hours.  Thyroid Function Tests: No results for input(s): TSH, T4TOTAL, FREET4, T3FREE, THYROIDAB in the last 72 hours. Anemia Panel: No results for input(s): VITAMINB12, FOLATE, FERRITIN, TIBC, IRON, RETICCTPCT in the last 72 hours.    Radiology Studies: I have reviewed all of the imaging during this hospital visit personally     Scheduled Meds:  Chlorhexidine Gluconate Cloth  6 each Topical Daily   enoxaparin (LOVENOX) injection  100 mg Subcutaneous Q12H   insulin aspart  0-15 Units Subcutaneous Q4H   lidocaine  1 patch Transdermal Q24H   metoCLOPramide (REGLAN) injection  10 mg Intravenous Q6H   metoprolol tartrate  10 mg Intravenous Q8H   nicotine  21 mg Transdermal Daily   pantoprazole (PROTONIX) IV  40 mg Intravenous Q12H   sodium chloride flush  10 mL Intracatheter Q12H   Continuous Infusions:  sodium chloride Stopped (03/05/21 0320)   acetaminophen     methocarbamol (ROBAXIN) IV     piperacillin-tazobactam (ZOSYN)  IV 3.375 g (03/17/21 0926)   potassium chloride 10 mEq (03/17/21 0947)   TPN ADULT (ION) 85 mL/hr at 03/16/21 1719   TPN ADULT (ION)       LOS: 25 days   Total time spent in minutes: [place], MD [facility] Hospitalist

## 2021-03-17 NOTE — Progress Notes (Signed)
Occupational Therapy Treatment Patient Details Name: Travis Conrad MRN: [id number] DOB: January 23, 1951 Today's Date: 03/17/2021   History of present illness 70 yo admitted 9/3 with abdominal pain, vomiting and ileus. 9/4 Afib with RVR. 9/8 intrabdominal abscess s/p IR drain placed. 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 Pt found to have lt iliopsoas hematoma on CT 9/16.  9/20 left knee aspiration. 9/26 NG tube removed. 9/27 NGT replaced due to vomiting. PMhx: HTN, HLD, Afib, ETOH use   OT comments  Patient supine in bed and stating he is feeling better.  Patient was min assist to get to eob and stated he felt dizzy initially.  Patient performed light grooming seated on eob and began feeling better. Patient performed 3 static stands from eob with rw tolerating less than a minute each stand due to dizziness.  Patient was mod assist to return to supine. Acute OT to continue to follow.    Recommendations for follow up therapy are one component of a multi-disciplinary discharge planning process, led by the attending physician.  Recommendations may be updated based on patient status, additional functional criteria and insurance authorization.    Follow Up Recommendations  Home health OT    Equipment Recommendations  3 in 1 bedside commode    Recommendations for Other Services      Precautions / Restrictions Precautions Precautions: Fall Precaution Comments: wound vAC, jp drain, NGT       Mobility Bed Mobility Overal bed mobility: Needs Assistance Bed Mobility: Supine to Sit;Sit to Supine Rolling: Supervision Sidelying to sit: Supervision Supine to sit: Min assist Sit to supine: Mod assist   General bed mobility comments: required min assist for supine to sit for trunk and mod for LE to go back to supine    Transfers Overall transfer level: Needs assistance   Transfers: Sit to/from Stand Sit to Stand: Min assist;From elevated  surface         General transfer comment: performed static standing from eob    Balance Overall balance assessment: Needs assistance Sitting-balance support: Feet supported;No upper extremity supported Sitting balance-Leahy Scale: Good Sitting balance - Comments: Able to maintain without UE support   Standing balance support: Bilateral upper extremity supported;During functional activity Standing balance-Leahy Scale: Poor Standing balance comment: Reliant on RW in standing tolerated less than a minute of standing due to dizziness                           ADL either performed or assessed with clinical judgement   ADL Overall ADL's : Needs assistance/impaired     Grooming: Wash/dry hands;Wash/dry face;Supervision/safety;Sitting Grooming Details (indicate cue type and reason): performed while sitting on eob                               General ADL Comments: performed light grooming seated on eob     Vision       Perception     Praxis      Cognition Arousal/Alertness: Awake/alert Behavior During Therapy: WFL for tasks assessed/performed Overall Cognitive Status: Impaired/Different from baseline Area of Impairment: Memory                     Memory: Decreased short-term memory         General Comments: Better spirits today        Exercises General Exercises -  Lower Extremity Long Arc Quad: AROM;Both;Seated;20 reps Hip Flexion/Marching: AROM;Both;10 reps;Seated   Shoulder Instructions       General Comments      Pertinent Vitals/ Pain       Pain Assessment: 0-10 Pain Score: 6  Faces Pain Scale: Hurts little more Pain Location: abdomen Pain Descriptors / Indicators: Aching;Guarding Pain Intervention(s): Limited activity within patient's tolerance  Home Living                                          Prior Functioning/Environment              Frequency  Min 2X/week        Progress  Toward Goals  OT Goals(current goals can now be found in the care plan section)  Progress towards OT goals: Progressing toward goals  Acute Rehab OT Goals Patient Stated Goal: return home and be able to walk OT Goal Formulation: With patient Time For Goal Achievement: 03/23/21 Potential to Achieve Goals: Good ADL Goals Pt Will Perform Grooming: with modified independence;standing Pt Will Perform Lower Body Bathing: with supervision;with set-up;sit to/from stand Pt Will Perform Lower Body Dressing: with set-up;with supervision;with adaptive equipment Pt Will Transfer to Toilet: with supervision;ambulating Pt Will Perform Toileting - Clothing Manipulation and hygiene: with supervision;sitting/lateral leans;sit to/from stand Additional ADL Goal #1: Pt will independently verbalize 3 strateiges to reduce risk of falls  Plan Discharge plan remains appropriate    Co-evaluation                 AM-PAC OT "6 Clicks" Daily Activity     Outcome Measure   Help from another person eating meals?: Total Help from another person taking care of personal grooming?: A Little Help from another person toileting, which includes using toliet, bedpan, or urinal?: A Lot Help from another person bathing (including washing, rinsing, drying)?: A Lot Help from another person to put on and taking off regular upper body clothing?: A Little Help from another person to put on and taking off regular lower body clothing?: Total 6 Click Score: 12    End of Session Equipment Utilized During Treatment: Rolling walker  OT Visit Diagnosis: Muscle weakness (generalized) (M62.81);Pain   Activity Tolerance Patient tolerated treatment well   Patient Left in bed;with call bell/phone within reach;with bed alarm set;with family/visitor present   Nurse Communication Mobility status        Time: 1350-1419 OT Time Calculation (min): 29 min  Charges: OT General Charges $OT Visit: 1 Visit OT Treatments $Self  Care/Home Management : 8-22 mins $Therapeutic Activity: 8-22 mins  Lodema Hong, OTA   Ulysse Siemen Alexis Goodell 03/17/2021, 2:30 PM

## 2021-03-17 NOTE — Progress Notes (Signed)
ANTICOAGULATION CONSULT NOTE - Follow Up Consult  Pharmacy Consult for Lovenox Indication: atrial fibrillation  Allergies  Allergen Reactions   Atorvastatin Other (See Comments)    myalgias    Patient Measurements: Height: 6' (182.9 cm) Weight: 98.9 kg (218 lb 0.6 oz) IBW/kg (Calculated) : 77.6  Vital Signs: BP: 121/70 (09/28 2003) Pulse Rate: 107 (09/28 2003)  Labs: Recent Labs    03/16/21 0154 03/16/21 0920 03/17/21 0442  HGB  --  8.1* 8.3*  HCT  --  26.7* 25.5*  PLT  --  278 244  CREATININE 1.04  --  1.15     Estimated Creatinine Clearance: 72.8 mL/min (by C-G formula based on SCr of 1.15 mg/dL).   Assessment: 70 y/o male admitted for sepsis from contained perforated diverticulitis and intra-abdominal abscess with active ileus. On PTA Xarelto for Afib with last dose on 9/2 at 2000.  Pharmacy consulted for Lovenox due to post operative ileus.   CT 9/27 with continued complex fluid collection along left psoas. Per IR note no aspiration/drainage recommended -hg= 8.3, SCr= 1.1   Goal of Therapy:  Anti-Xa level Goal:  0.6-1 units/mL 4 hours post dose Monitor platelets by anticoagulation protocol: Yes   Plan:  Lovenox 100 mg SQ q12h CBC at least q72h while on Lovenox F/U ability to transition to oral agent  Hildred Laser, PharmD Clinical Pharmacist **Pharmacist phone directory can now be found on [email or web address] (PW [email or web address]).  Listed under [place].

## 2021-03-18 DIAGNOSIS — K5720 Diverticulitis of large intestine with perforation and abscess without bleeding: Secondary | ICD-10-CM | POA: Diagnosis not present

## 2021-03-18 LAB — BASIC METABOLIC PANEL
Anion gap: 6 (ref 5–15)
BUN: 25 mg/dL — ABNORMAL HIGH (ref 8–23)
CO2: 23 mmol/L (ref 22–32)
Calcium: 7.7 mg/dL — ABNORMAL LOW (ref 8.9–10.3)
Chloride: 112 mmol/L — ABNORMAL HIGH (ref 98–111)
Creatinine, Ser: 1.11 mg/dL (ref 0.61–1.24)
GFR, Estimated: >60 mL/min (ref >60)
Glucose, Bld: 132 mg/dL — ABNORMAL HIGH (ref 70–99)
Potassium: 3.5 mmol/L (ref 3.5–5.1)
Sodium: 141 mmol/L (ref 135–145)

## 2021-03-18 LAB — GLUCOSE, CAPILLARY
Glucose-Capillary: 129 mg/dL — ABNORMAL HIGH (ref 70–99)
Glucose-Capillary: 133 mg/dL — ABNORMAL HIGH (ref 70–99)
Glucose-Capillary: 139 mg/dL — ABNORMAL HIGH (ref 70–99)
Glucose-Capillary: 139 mg/dL — ABNORMAL HIGH (ref 70–99)
Glucose-Capillary: 98 mg/dL (ref 70–99)

## 2021-03-18 MED ORDER — ACETAMINOPHEN 10 MG/ML IV SOLN
1000.0000 mg | Freq: Four times a day (QID) | INTRAVENOUS | Status: AC
  Administered 2021-03-18 – 2021-03-19 (×4): 1000 mg via INTRAVENOUS
  Filled 2021-03-18 (×5): qty 100

## 2021-03-18 MED ORDER — TRAVASOL 10 % IV SOLN
INTRAVENOUS | Status: AC
  Filled 2021-03-18: qty 1020

## 2021-03-18 MED ORDER — FUROSEMIDE 10 MG/ML IJ SOLN
20.0000 mg | Freq: Every day | INTRAMUSCULAR | Status: DC
  Administered 2021-03-18 – 2021-03-20 (×3): 20 mg via INTRAVENOUS
  Filled 2021-03-18 (×4): qty 2

## 2021-03-18 MED ORDER — METHOCARBAMOL 1000 MG/10ML IJ SOLN
1000.0000 mg | Freq: Three times a day (TID) | INTRAVENOUS | Status: DC
  Administered 2021-03-18 – 2021-03-26 (×22): 1000 mg via INTRAVENOUS
  Filled 2021-03-18: qty 10
  Filled 2021-03-18: qty 1000
  Filled 2021-03-18: qty 10
  Filled 2021-03-18: qty 1000
  Filled 2021-03-18 (×5): qty 10
  Filled 2021-03-18: qty 1000
  Filled 2021-03-18: qty 10
  Filled 2021-03-18: qty 1000
  Filled 2021-03-18: qty 10
  Filled 2021-03-18: qty 1000
  Filled 2021-03-18 (×3): qty 10
  Filled 2021-03-18 (×2): qty 1000
  Filled 2021-03-18 (×3): qty 10
  Filled 2021-03-18: qty 1000
  Filled 2021-03-18 (×2): qty 10

## 2021-03-18 MED ORDER — DILTIAZEM HCL-DEXTROSE 125-5 MG/125ML-% IV SOLN (PREMIX)
5.0000 mg/h | INTRAVENOUS | Status: AC
  Administered 2021-03-18 – 2021-03-23 (×6): 5 mg/h via INTRAVENOUS
  Administered 2021-03-24: 10 mg/h via INTRAVENOUS
  Administered 2021-03-25: 5 mg/h via INTRAVENOUS
  Administered 2021-03-25: 10 mg/h via INTRAVENOUS
  Filled 2021-03-18 (×11): qty 125

## 2021-03-18 MED ORDER — LIDOCAINE 5 % EX PTCH
1.0000 | MEDICATED_PATCH | CUTANEOUS | Status: DC
  Administered 2021-03-18 – 2021-04-05 (×13): 1 via TRANSDERMAL
  Filled 2021-03-18 (×14): qty 1

## 2021-03-18 MED ORDER — POTASSIUM CHLORIDE 10 MEQ/50ML IV SOLN
10.0000 meq | INTRAVENOUS | Status: AC
  Administered 2021-03-18 (×5): 10 meq via INTRAVENOUS
  Filled 2021-03-18 (×5): qty 50

## 2021-03-18 NOTE — Progress Notes (Signed)
PROGRESS NOTE    Travis Conrad  UJW:[id number] DOB: 04-11-1951 DOA: 02/19/2021 PCP: [name], MD    Brief Narrative:  Travis Conrad was admitted to the hospital with the working diagnosis of severe sepsis due to perforated diverticulitis/ intra-abdominal abscess. Now sp lapartomy, with colectomy, complicated with postoperative ileus.   70 year old male past medical history for atrial fibrillation, dyslipidemia, hypertension and diverticulosis who presented with nausea, vomiting and abdominal pain.  Reported several days of worse abdominal pain, and poor oral intake.  Because of severe symptoms he presented to the hospital.  On his initial physical examination his blood pressure was 84/65, heart rate 57, respiratory rate 20, oxygen saturation 91%, he was ill-looking appearing, heart S1-S2, present, irregular irregular, lungs clear to auscultation, abdomen tender to palpation in the mid abdomen, no lower extremity edema.   CT of the abdomen and pelvis with acute sigmoid diverticulitis with small extraluminal gas and fluid collection in the sigmoid mesocolon, 3.5 x 1.5 x 2.7 cm consistent with perforation and abscess.  Dilated proximal and decompressed distal small bowel loops, possible ileus.   9/4 developed atrial fibrillation with rapid ventricular response, required intravenous amiodarone infusion.   Follow-up CT 9/7 with increased size pelvic abscess. On 9/8 patient underwent CT-guided drainage of pelvic abscess.   09/10 Patient underwent exploratory laparotomy with sigmoid colectomy and diverting colostomy placement.  Postoperative ileus.   Urology and ID have been consulted for epididymitis.   9/19 Postoperative anemia: Required 1 unit packed red blood cells.    Positive left knee pain, positive effusion, underwent left knee aspiration and injection with good toleration.  Fluid with no crystals, gram stain no organisms.   Slowly resolving ileus. Patient had NG tube clamped but  patient vomiting, needing to reconnect tube to suction.     09/26 NG tube has been removed.   09/27 patient with vomiting and abdominal distention, abdominal radiographs with recurrent ileus, NG tube has replaced and connected to low intermittent suction.    Assessment & Plan:   Principal Problem:   Diverticulitis of large intestine with perforation and abscess without bleeding Active Problems:   Diverticulitis of intestine with abscess   Atrial fibrillation, chronic (HCC)   Essential hypertension   Diverticulitis   AKI (acute kidney injury) ([place])   Severe sepsis with acute organ dysfunction (HCC)   Intra-abdominal abscess (HCC)   Epididymitis   Severe sepsis due to perforated diverticulitis, sp colectomy. Left psoas collection. Post operative ileus.  Patient with persistent/ recurrent ileus, NG tube was replaced 03/14/2021.  Ileus managed by general surgery.  CT 03/15/2021 shows continued complex fluid collection along left iliopsoas.  SBO with transition in mid jejunum.  Surgery consulted IR to evaluate for drainage of the left psoas abscess however, IR do not recommend aspirating at this point in time.  He is on Zosyn.  No pain today.  Management per surgery.   2. Post operative anemia. sp 1 unit PRBC on 03/07/2021. Hemoglobin remains stable.  Monitor closely   3. Paroxysmal atrial fibrillation/ HTN. Patient was on amiodarone and metoprolol with good rate control. On enoxaparin for anticoagulation, holding DOAC until po intake more consistent.  Due to recurrent ileus, on 03/16/2021, general surgery recommended transitioning all oral or enteral medications to IV route.  Amiodarone and metoprolol was stopped and he was started on IV metoprolol 10 mg daily 8 hours.  Rates fairly controlled.   4. Right epididymitis resolved, completed therapy with doxycycline.    5. AKI with hypokalemia/  hyperkalemia,hyponatremia and hypophosphatemia.  Continue TPN per pharmacy protocol.  All  electrolytes normal with AKI resolved.   6. Alcohol withdrawal. Resolved.    7. Left knee arthritis. Clinically improved after steroid injection.  Pain is well controlled.   8.  Essential hypertension: Controlled.  Amlodipine discontinued due to recurrent ileus and surgery recommendations to withhold oral medications.  Status is: Inpatient  Remains inpatient appropriate because:Inpatient level of care appropriate due to severity of illness  Dispo: The patient is from: Home              Anticipated d/c is to: Home              Patient currently is not medically stable to d/c.   Difficult to place patient No   DVT prophylaxis: Enoxxaparin   Code Status:    full  Family Communication:  Plan discussed with his wife at bedside.    Nutrition Status: Nutrition Problem: Inadequate oral intake Etiology: acute illness (diverticulitis) Signs/Symptoms: per patient/family report, NPO status Interventions: Prostat, Boost Plus    Consultants:  Surgery   Subjective: Patient seen and examined.  Wife at the bedside.  Still with intermittent abdominal pain.  Looks more comfortable compared to yesterday.  No nausea.  Objective: Vitals:   03/16/21 2003 03/17/21 1112 03/17/21 2014 03/18/21 0443  BP: 121/70 114/68 106/63 124/71  Pulse: (!) 107 (!) 101 (!) 104 98  Resp:  18 15 19   Temp:  98 F (36.7 C) (!) 97.5 F (36.4 C) 98.9 F (37.2 C)  TempSrc:  Oral Oral Oral  SpO2: 96% 94% 96% 96%  Weight:      Height:        Intake/Output Summary (Last 24 hours) at 03/18/2021 1105 Last data filed at 03/18/2021 1053 Gross per 24 hour  Intake 1071.68 ml  Output 2775 ml  Net -1703.32 ml    Filed Weights   03/07/21 0342 03/10/21 0447 03/16/21 0343  Weight: 94 kg 102 kg 98.9 kg    Examination:  General exam: Appears calm and comfortable  Respiratory system: Clear to auscultation. Respiratory effort normal. Cardiovascular system: S1 & S2 heard, RRR. No JVD, murmurs, rubs, gallops or  clicks. No pedal edema. Gastrointestinal system: Abdomen is nondistended, soft and slight tenderness generalized. No organomegaly or masses felt. Normal bowel sounds heard. Central nervous system: Alert and oriented. No focal neurological deficits. Extremities: Symmetric 5 x 5 power. Skin: No rashes, lesions or ulcers.  Psychiatry: Judgement and insight appear normal. Mood & affect appropriate.    Data Reviewed: I have personally reviewed following labs and imaging studies  CBC: Recent Labs  Lab 03/13/21 0209 03/14/21 0636 03/16/21 0920 03/17/21 0442  WBC 7.7 7.4 5.1 5.8  NEUTROABS  --   --  4.1  --   HGB 8.1* 8.8* 8.1* 8.3*  HCT 25.3* 26.3* 26.7* 25.5*  MCV 103.3* 101.5* 112.7* 101.2*  PLT 406* 383 278 482    Basic Metabolic Panel: Recent Labs  Lab 03/12/21 0407 03/14/21 0636 03/16/21 0154 03/17/21 0442 03/18/21 0138  NA 135 137 138 141 141  K 3.6 4.0 3.6 3.7 3.5  CL 105 105 107 109 112*  CO2 24 26 26 25 23   GLUCOSE 120* 148* 158* 144* 132*  BUN 23 22 23  24* 25*  CREATININE 0.85 0.88 1.04 1.15 1.11  CALCIUM 8.0* 8.0* 7.9* 7.8* 7.7*  MG  --  2.1 2.1 2.1  --   PHOS 3.4 3.3 3.6 3.1  --  GFR: Estimated Creatinine Clearance: 75.4 mL/min (by C-G formula based on SCr of 1.11 mg/dL). Liver Function Tests: Recent Labs  Lab 03/14/21 0636 03/17/21 0442  AST 16 30  ALT 18 30  ALKPHOS 84 90  BILITOT 0.6 0.2*  PROT 5.3* 5.1*  ALBUMIN 1.6* 1.5*    No results for input(s): LIPASE, AMYLASE in the last 168 hours. No results for input(s): AMMONIA in the last 168 hours. Coagulation Profile: No results for input(s): INR, PROTIME in the last 168 hours. Cardiac Enzymes: No results for input(s): CKTOTAL, CKMB, CKMBINDEX, TROPONINI in the last 168 hours. BNP (last 3 results) No results for input(s): PROBNP in the last 8760 hours. HbA1C: No results for input(s): HGBA1C in the last 72 hours. CBG: Recent Labs  Lab 03/17/21 1623 03/17/21 2011 03/17/21 2332  03/18/21 0447 03/18/21 0946  GLUCAP 135* 116* 119* 129* 133*    Lipid Profile: No results for input(s): CHOL, HDL, LDLCALC, TRIG, CHOLHDL, LDLDIRECT in the last 72 hours.  Thyroid Function Tests: No results for input(s): TSH, T4TOTAL, FREET4, T3FREE, THYROIDAB in the last 72 hours. Anemia Panel: No results for input(s): VITAMINB12, FOLATE, FERRITIN, TIBC, IRON, RETICCTPCT in the last 72 hours.    Radiology Studies: I have reviewed all of the imaging during this hospital visit personally     Scheduled Meds:  Chlorhexidine Gluconate Cloth  6 each Topical Daily   enoxaparin (LOVENOX) injection  100 mg Subcutaneous Q12H   insulin aspart  0-15 Units Subcutaneous Q4H   lidocaine  1 patch Transdermal Q24H   metoCLOPramide (REGLAN) injection  10 mg Intravenous Q6H   metoprolol tartrate  10 mg Intravenous Q8H   nicotine  21 mg Transdermal Daily   pantoprazole (PROTONIX) IV  40 mg Intravenous Q12H   sodium chloride flush  10 mL Intracatheter Q12H   Continuous Infusions:  sodium chloride Stopped (03/05/21 0320)   acetaminophen     methocarbamol (ROBAXIN) IV     piperacillin-tazobactam (ZOSYN)  IV 3.375 g (03/18/21 0757)   potassium chloride 10 mEq (03/18/21 1053)   TPN ADULT (ION) 85 mL/hr at 03/17/21 1712   TPN ADULT (ION)       LOS: 26 days   Total time spent in minutes: [place], MD [facility] Hospitalist

## 2021-03-18 NOTE — Consult Note (Signed)
[place] Nurse wound follow up Patient receiving care in [facility] 2W01 Wound type: Surgical midline incision Wound bed: Clean, pink granulation tissue Drainage (amount, consistency, odor) Sanguinous in canister Periwound: intact Dressing procedure/placement/frequency: One piece of black foam dressing removed. One piece of black foam replaced. Drape applied, immediate seal obtained at 125 mmHg WOC to follow M/W/F   [place] Nurse ostomy follow up Stoma type/location: LUQ colostomy Stomal assessment/size: 1 3/8" red budded just above the level of the skin. Sutures in place.  Peristomal assessment: intact Treatment options for stomal/peristomal skin: Barrier ring Output: emptied 150cc of dark green/black sticky stool. Ostomy pouching: 1pc. convex Education provided: Wife was able to go through the entire process of changing the pouch with very little direction. Reviewed proper cleaning, frequency of pouch changes, when to empty. She also emptied the pouch today. Enrolled patient in [place] Start Discharge program: Yes (previously) Wife states SS kit received.    Cathlean Marseilles Tamala Julian, MSN, RN, [place], [name], [facility] Wound Treatment Associate Pager [phone]

## 2021-03-18 NOTE — Progress Notes (Signed)
Physical Therapy Treatment Patient Details Name: Travis Conrad MRN: [id number] DOB: [birth year] Today's Date: 03/18/2021   History of Present Illness 70 yo admitted 9/3 with abdominal pain, vomiting and ileus. 9/4 Afib with RVR. 9/8 intrabdominal abscess s/p IR drain placed. 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 Pt found to have lt iliopsoas hematoma on CT 9/16.  9/20 left knee aspiration. 9/26 NG tube removed. 9/27 NGT replaced due to vomiting. PMhx: HTN, HLD, Afib, ETOH use    PT Comments    Pt with continued lack of gait tolerance today with rapid fatigue at 120' with HR 153. Pt with wheezing respirations with sats 95% throughout. Pt denied sitting in chair more than 5 min, unable to progress to seated or standing HEP beyond 5 marches. Pt appears very internally distracted with increased memory/processing deficits from prior session. Wife present throughout session. Pt returned to bed with pt stating he would sit in chair with nursing staff and again educated for benefits of increased mobility and being OOB to chair throughout the day.     Recommendations for follow up therapy are one component of a multi-disciplinary discharge planning process, led by the attending physician.  Recommendations may be updated based on patient status, additional functional criteria and insurance authorization.  Follow Up Recommendations  Home health PT;Supervision/Assistance - 24 hour     Equipment Recommendations  3in1 (PT)    Recommendations for Other Services       Precautions / Restrictions Precautions Precautions: Fall;Other (comment) Precaution Comments: wound vAC,  NGT     Mobility  Bed Mobility   Bed Mobility: Sit to Supine       Sit to supine: Min assist   General bed mobility comments: pt sitting EOB on arrival, return to supine with assist to lift legs to surface    Transfers Overall transfer level: Needs assistance    Transfers: Sit to/from Stand Sit to Stand: Min guard;Min assist         General transfer comment: minguard to stand from elevated surface EOB, min assist to stand from low chair without armrests  Ambulation/Gait Ambulation/Gait assistance: Min guard Gait Distance (Feet): 120 Feet Assistive device: Rolling walker (2 wheeled) Gait Pattern/deviations: Trunk flexed;Decreased stride length;Step-through pattern   Gait velocity interpretation: 1.31 - 2.62 ft/sec, indicative of limited community ambulator General Gait Details: pt with improved posture initially but returned to flexed posture within 10' with min cues for posture. Pt fatigued rapidly at 120' with chair pulled to him. Able to complete additional 120' after rest with pt anxious and speeding up with end of gait with cues for safety   Stairs             Wheelchair Mobility    Modified Rankin (Stroke Patients Only)       Balance Overall balance assessment: Needs assistance Sitting-balance support: Feet supported;No upper extremity supported Sitting balance-Leahy Scale: Good Sitting balance - Comments: Able to maintain without UE support   Standing balance support: Bilateral upper extremity supported;During functional activity Standing balance-Leahy Scale: Poor Standing balance comment: reliant on RW in standing                            Cognition Arousal/Alertness: Awake/alert Behavior During Therapy: Anxious Overall Cognitive Status: Impaired/Different from baseline Area of Impairment: Attention;Memory;Following commands;Safety/judgement  Current Attention Level: Sustained Memory: Decreased short-term memory Following Commands: Follows one step commands consistently Safety/Judgement: Decreased awareness of deficits     General Comments: pt with decreased activity tolerance, decreased awareness of safety and more impaired memory from prior sessions. Pt appears more  anxious today with slight RUE tremor noted end of session      Exercises General Exercises - Lower Extremity Hip Flexion/Marching: Both;Standing;5 reps;AROM (reliant on RW)    General Comments        Pertinent Vitals/Pain Pain Score: 6  Pain Location: abdomen Pain Descriptors / Indicators: Aching;Guarding Pain Intervention(s): Limited activity within patient's tolerance;Repositioned;Monitored during session;Premedicated before session    Home Living                      Prior Function            PT Goals (current goals can now be found in the care plan section) Progress towards PT goals: Progressing toward goals    Frequency    Min 3X/week      PT Plan Current plan remains appropriate    Co-evaluation              AM-PAC PT "6 Clicks" Mobility   Outcome Measure  Help needed turning from your back to your side while in a flat bed without using bedrails?: None Help needed moving from lying on your back to sitting on the side of a flat bed without using bedrails?: None Help needed moving to and from a bed to a chair (including a wheelchair)?: A Little Help needed standing up from a chair using your arms (e.g., wheelchair or bedside chair)?: A Little Help needed to walk in hospital room?: A Little Help needed climbing 3-5 steps with a railing? : A Lot 6 Click Score: 19    End of Session   Activity Tolerance: Patient limited by fatigue Patient left: in bed;with call bell/phone within reach;with bed alarm set;with family/visitor present Nurse Communication: Mobility status PT Visit Diagnosis: Other abnormalities of gait and mobility (R26.89);Difficulty in walking, not elsewhere classified (R26.2)     Time: 2482-5003 PT Time Calculation (min) (ACUTE ONLY): 41 min  Charges:  $Gait Training: 8-22 mins $Therapeutic Activity: 23-37 mins                     Ayshia Gramlich P, PT Acute Rehabilitation Services Pager: [phone] Office:  [place] 03/18/2021, 1:11 PM

## 2021-03-18 NOTE — Progress Notes (Signed)
Nutrition Follow-up  DOCUMENTATION CODES:  Not applicable  INTERVENTION:  -Continue TPN management per Pharmacy  NUTRITION DIAGNOSIS:  Inadequate oral intake related to acute illness (diverticulitis) as evidenced by per patient/family report, NPO status. -- ongoing  GOAL:  Patient will meet greater than or equal to 90% of their needs -- met with TPN  MONITOR:  Supplement acceptance, PO intake, Diet advancement, Weight trends, Labs, I & O's  REASON FOR ASSESSMENT:  Consult New TPN/TNA  ASSESSMENT:  Pt with PMH significant for HTN, Afib, h/o diverticulitis, heavy EtOH use, and high cholesterol admitted with severe sepsis due to perforated diverticulitis/intra-abdominal abscess. Pt now s/p laparotomy w/ colectomy complicated by postoperative ileus.  9/04 - developed Afib w/ rapid ventricular response, required IV amiodarone infusion 9/07 - f/u CT w/ increased size of pelvic abscess 9/08 - s/p drainage catheter placement of pelvic abscess 9/09 - TPN initiated 9/10 - s/p ex lap, sigmoid colectomy, end colostomy creation (Hartmann's procedure) w/ enterorrhaphy and drainage of intra-abdominal abscess and woundVAC placement of 50cm area of abdomen 9/16 - CT revealed heterogenous left iliopsoas collection which may represent abscess or hematoma, stranding in the retroperitoneum and subcu tissue; per IR do no recommend aspiration or drainage at this time  9/19 - post-operative anemia requiring 1 unit PRBC   9/20 - s/p lt knee aspiration and injection due to lt knee effusions 9/23 - diet advanced to clear liquids 9/26 - NGT removed; TPN rate reduced to half 9/27 - NGT replaced and connected to LIWS 2/2 vomiting and abdominal distention, abd xray showing recurrent ileus; pt made NPO 9/29 - surgical drain removed   Pt c/o dry mouth and states that he does not feel well this morning. Endorses ongoing abdominal pain but denies nausea today. NGT remains in place with plan to continue cyclical  clamping trials and TPN meeting 100% nutritional needs. Goal rate is 72m/hr which provides 102g protein and 2104 kcals.   UOP: 2.3L x24 hours NGT output: [id number] hours I/O: +14.2 L since admit  Mild pitting edema noted to BUE and BLE per RN edema assessment.   Current weight: 98.9 kg Admit weight: 79.4 kg   Medications: lasix, reglan, SSI Q4H, protonix, KCl 1029mQ1H x5 , IV abx Labs reviewed. CBGs 116-139x 24 hours  Diet Order:   Diet Order             Diet NPO time specified Except for: Ice Chips, Other (See Comments)  Diet effective now                  EDUCATION NEEDS:  No education needs have been identified at this time  Skin:  Skin Assessment: Skin Integrity Issues: Skin Integrity Issues:: Wound VAC Wound Vac: abdomen Incisions: closed incision to abdomen  Last BM:  9/29  Height:  Ht Readings from Last 1 Encounters:  02/26/21 6' (1.829 m)   Weight:  Wt Readings from Last 10 Encounters:  03/16/21 98.9 kg  06/28/20 79.4 kg  04/06/20 78.7 kg  01/05/20 78 kg  11/03/19 78.7 kg  10/03/19 79.4 kg  08/20/19 78.9 kg  07/22/19 79.1 kg  05/02/19 79.4 kg  04/02/18 80.3 kg   BMI:  Body mass index is 29.57 kg/m.  Estimated Nutritional Needs:  Kcal:  2000-2200 Protein:  100-110 grams Fluid:  >2L    AmaLarkin InaS, RD, LDN (she/her/hers) RD pager number and weekend/on-call pager number located in [place]

## 2021-03-18 NOTE — Progress Notes (Signed)
Progress Note  20 Days Post-Op  Subjective: Patient reports he just does not feel well this AM. Still struggling some with pain control. No output recorded for suction phase of NGT overnight. He complains of dry mouth despite taking ice chips and sips of water. He ambulated with PT/OT yesterday and reports OT told him they would be back today.   Objective: Vital signs in last 24 hours: Temp:  [97.5 F (36.4 C)-98.9 F (37.2 C)] 98.9 F (37.2 C) (09/30 0443) Pulse Rate:  [98-104] 98 (09/30 0443) Resp:  [15-19] 19 (09/30 0443) BP: (106-124)/(63-71) 124/71 (09/30 0443) SpO2:  [94 %-96 %] 96 % (09/30 0443) Last BM Date: 03/17/21  Intake/Output from previous day: 09/29 0701 - 09/30 0700 In: 1071.7 [I.V.:597.2; IV Piggyback:474.5] Out: 2300 [Urine:2300] Intake/Output this shift: No intake/output data recorded.  PE: Gen:  Alert, NAD, pleasant Pulm:  Normal rate and effort. Abd: Soft, ND, diffuse mild ttp without peritonitis. Stoma appears pink and viable. Thick stool in colostomy bag. midline wound with VAC present; NGT with thin drainage   Lab Results:  Recent Labs    03/16/21 0920 03/17/21 0442  WBC 5.1 5.8  HGB 8.1* 8.3*  HCT 26.7* 25.5*  PLT 278 244   BMET Recent Labs    03/17/21 0442 03/18/21 0138  NA 141 141  K 3.7 3.5  CL 109 112*  CO2 25 23  GLUCOSE 144* 132*  BUN 24* 25*  CREATININE 1.15 1.11  CALCIUM 7.8* 7.7*   PT/INR No results for input(s): LABPROT, INR in the last 72 hours. CMP     Component Value Date/Time   NA 141 03/18/2021 0138   NA 134 09/30/2019 0959   NA 142 03/03/2016 0921   K 3.5 03/18/2021 0138   K 4.3 03/03/2016 0921   CL 112 (H) 03/18/2021 0138   CO2 23 03/18/2021 0138   CO2 25 03/03/2016 0921   GLUCOSE 132 (H) 03/18/2021 0138   GLUCOSE 89 03/03/2016 0921   BUN 25 (H) 03/18/2021 0138   BUN 11 09/30/2019 0959   BUN 13.6 03/03/2016 0921   CREATININE 1.11 03/18/2021 0138   CREATININE 1.2 03/03/2016 0921   CALCIUM 7.7 (L)  03/18/2021 0138   CALCIUM 9.6 03/03/2016 0921   PROT 5.1 (L) 03/17/2021 0442   PROT 7.3 03/03/2016 0921   ALBUMIN 1.5 (L) 03/17/2021 0442   ALBUMIN 3.8 03/03/2016 0921   AST 30 03/17/2021 0442   AST 36 (H) 03/03/2016 0921   ALT 30 03/17/2021 0442   ALT 31 03/03/2016 0921   ALKPHOS 90 03/17/2021 0442   ALKPHOS 116 03/03/2016 0921   BILITOT 0.2 (L) 03/17/2021 0442   BILITOT 0.71 03/03/2016 0921   GFRNONAA >60 03/18/2021 0138   GFRNONAA 67 07/25/2013 1712   GFRAA >60 10/03/2019 0930   GFRAA 77 07/25/2013 1712   Lipase     Component Value Date/Time   LIPASE 22 02/19/2021 1653       Studies/Results: DG Abd Portable 1V-Small Bowel Obstruction Protocol-24 hr delay  Result Date: 03/17/2021 CLINICAL DATA:  Small-bowel obstruction, 24 hour delay EXAM: PORTABLE ABDOMEN - 1 VIEW COMPARISON:  Abdominal radiographs 1 day prior, CT abdomen/pelvis and abdominal radiographs 03/15/2021 FINDINGS: The enteric catheter tip and side hole project over the stomach. A surgical drain is again seen projecting over the pelvis. There is mild gaseous distention of the bowel throughout the abdomen, grossly similar to the prior study. Contrast is seen over the rectum. There is no acute osseous abnormality.  The lung bases are clear. IMPRESSION: 1. Overall unchanged gaseous distention of the bowel throughout the abdomen. Enteric contrast is seen in the rectum. 2. Enteric catheter tip and side hole project over the stomach. Electronically Signed   By: Valetta Mole M.D.   On: 03/17/2021 10:04   DG Abd Portable 1V-Small Bowel Obstruction Protocol-initial, 8 hr delay  Result Date: 03/16/2021 CLINICAL DATA:  Small-bowel obstruction EXAM: PORTABLE ABDOMEN - 1 VIEW COMPARISON:  03/16/2021 FINDINGS: Two supine frontal views of the abdomen and pelvis are obtained. Enteric catheter tip projects over the gastric body. Stable surgical drain lower abdomen. There is persistent gaseous distention of the small bowel within the  central abdomen. Dilute oral contrast has progressed throughout the colon. No masses or abnormal calcifications. IMPRESSION: 1. Support devices as above. 2. Stable gaseous distention of the small bowel. Oral contrast has progressed into the colon, excluding high-grade obstruction. Electronically Signed   By: Randa Ngo M.D.   On: 03/16/2021 21:58    Anti-infectives: Anti-infectives (From admission, onward)    Start     Dose/Rate Route Frequency Ordered Stop   03/09/21 0800  piperacillin-tazobactam (ZOSYN) IVPB 3.375 g        3.375 g 12.5 mL/hr over 240 Minutes Intravenous Every 8 hours 03/09/21 0706 04/04/21 0759   02/26/21 0100  doxycycline (VIBRAMYCIN) 100 mg in sodium chloride 0.9 % 250 mL IVPB        100 mg 125 mL/hr over 120 Minutes Intravenous 2 times daily 02/26/21 0005 03/08/21 2359   02/20/21 0300  piperacillin-tazobactam (ZOSYN) IVPB 3.375 g        3.375 g 12.5 mL/hr over 240 Minutes Intravenous Every 8 hours 02/19/21 1907 03/08/21 2359   02/19/21 1900  ceFEPIme (MAXIPIME) 2 g in sodium chloride 0.9 % 100 mL IVPB  Status:  Discontinued        2 g 200 mL/hr over 30 Minutes Intravenous  Once 02/19/21 1857 02/19/21 1906   02/19/21 1900  metroNIDAZOLE (FLAGYL) IVPB 500 mg  Status:  Discontinued        500 mg 100 mL/hr over 60 Minutes Intravenous  Once 02/19/21 1857 02/19/21 1906   02/19/21 1815  piperacillin-tazobactam (ZOSYN) IVPB 3.375 g        3.375 g 100 mL/hr over 30 Minutes Intravenous  Once 02/19/21 1809 02/19/21 1944        Assessment/Plan POD 20 s/p ex lap, sigmoid colectomy, end colostomy with enterorrhaphy and drainage of intraabdominal abscess - Dr. Kieth Brightly and Dr. Radene Knee 9/10 for Acute sigmoid diverticulitis with contained perforation and abscess - CT 9/16 demonstrates heterogenous left iliopsoas collection likely hematoma, stranding in the retroperitoneum and subcu tissue.  Distal colon appears decompressed and very small caliber but there is contrast entering  this area; there is additionally a similarly small-caliber segment of colon in the proximal transverse and the ascending colon is not dilated- suspect over all this represents small bowel/gastric ileus.  IR does not recommend draining the hematoma at this point. ID following and recommending Zosyn through 10/17  - NGT replaced 9/27  - CT 9/27 with continued complex fluid collection along left psoas, SBO with transition in mid jejunum, post-surgical changes of colon resection with colostomy - patient continues to have stool output from colostomy that is thick and mucus like almost, gastric motility seems to be the cause of nausea/vomiting at this point - Was started on reglan last weekend. Discussed with him the risk of tardive dyskinesia/EPS on 9/20. Patient stated understanding and  is okay with continuing Reglan.  - continue clamping trials with cyclic clamping - put back to suction at 1700 today and leave on suction until 0700 tomorrow AM and record drainage amount in that time frame; if patient becomes nauseated during clamping phase then place back on LIWS - pt may have ice chips and sips of clears with NGT clamped - surgical drain removed yesterday  - WOCN following for new ostomy teaching and vac changes - Cont TPN - Encouraged ambulation (working with therapies), use IS, multimodal pain control  - Cont abx per ID recs   FEN - ok to have sips of clears and ice chips with NGT clamped, NGT clamping, TPN VTE - SCDs, therapeutic lovenox  ID - Zosyn 9/4 - 10/17 (per ID) Foley - out, voiding.    Per primary: A fib - cards following  AKI - resolved ABL anemia -  stable HTN ETOH use L knee pain - per ortho  LOS: 26 days    Norm Parcel, [facility] 03/18/2021, 9:34 AM Please see [place] for pager number during day hours 7:00am-4:30pm

## 2021-03-18 NOTE — Progress Notes (Signed)
PHARMACY - TOTAL PARENTERAL NUTRITION CONSULT NOTE  Indication: Prolonged ileus  Patient Measurements: Height: 6' (182.9 cm) Weight: 98.9 kg (218 lb 0.6 oz) IBW/kg (Calculated) : 77.6 TPN AdjBW (KG): 82.6 Body mass index is 29.57 kg/m.  Assessment:  52 YOM with lower abdominal pain/N/V found to have perforated diverticulitis. CT abd on 9/3 found to have active ileus/pSBO from diverticulitis/bowel inflammation. Pharmacy consulted to manage TPN for prolonged ileus. Of note patient has a history pf alcohol dependency and is at high risk for refeeding.   Glucose / Insulin: no hx DM, A1c 5.9% - CBGs < 140. Used 8 units SSI in past 24 hrs, 15 units insulin in TPN Electrolytes: Na stable at 141 (max in TPN), K 3.5 (goal >/= 4 with ileus), Cl 112. Other electrolytes wnl.  Renal: SCr up slightly to 1.11, BUN 25 Hepatic: LFTs / tbili / TG WNL, albumin 1.5 Intake / Output; MIVF: NG clamped, drain removed, none from colostomy, UOP 51ml/kg/hr, LBM 9/6, vomited 9/27  GI Meds: IV Reglan/6hr, PPI IV/24h ID: Zosyn through 40/97/35 for complicated diverticulitis with IA abscess GI Imaging:  - 9/3 CT abd: Active ileus/PSBO - 9/16 CT abd: L-iliopsoas fluid collection (abscess or hematoma), resolution of prior diverticular abscess, still w/ evidence of ileus GI Surgeries / Procedures:  - 9/10 ex-lap, sigmoid colectomy w/ end colostomy creation, enterorrhaphy, drainage of intra-abd abscess, wound vac placement  Central access: PICC line 02/24/21 TPN start date: 02/25/21  Nutritional Goals, RD Estimated Needs Total Energy Estimated Needs: 2000-2200 Total Protein Estimated Needs: 100-110 grams Total Fluid Estimated Needs: >2L  Current Nutrition:  TPN CLD started 9/23 > NPO on 9/27 with NG to LIWS   Plan:  Continue TPN at goal rate 85 ml/hr to provide 102g AA, 367g CHO and 45g ILE for a total of 2104 kCal, meeting 100% of patient's needs Electrolytes in TPN: Increase K to 56mEq/L. Continue Na 150 mEq/L -  may need to reduce tomorrow. Continue Ca 2.37mEq/L, Mg 89mEq/L, Phos 21mmol/L. Change Cl:Ac to 1:2 Add standard MVI, trace elements, folate and thiamine to TPN Continue moderate SSI Q4H and 15 units regular insulin in TPN  Standard TPN labs on Mon and Thurs, repeat electrolytes tomorrow KCl 10 mEq x 5 runs   Cristela Felt, PharmD, BCPS Clinical Pharmacist 03/18/2021 7:07 AM

## 2021-03-19 ENCOUNTER — Inpatient Hospital Stay (HOSPITAL_COMMUNITY): Payer: Medicare Other

## 2021-03-19 DIAGNOSIS — K5720 Diverticulitis of large intestine with perforation and abscess without bleeding: Secondary | ICD-10-CM | POA: Diagnosis not present

## 2021-03-19 LAB — GLUCOSE, CAPILLARY
Glucose-Capillary: 110 mg/dL — ABNORMAL HIGH (ref 70–99)
Glucose-Capillary: 118 mg/dL — ABNORMAL HIGH (ref 70–99)
Glucose-Capillary: 124 mg/dL — ABNORMAL HIGH (ref 70–99)
Glucose-Capillary: 127 mg/dL — ABNORMAL HIGH (ref 70–99)
Glucose-Capillary: 135 mg/dL — ABNORMAL HIGH (ref 70–99)
Glucose-Capillary: 142 mg/dL — ABNORMAL HIGH (ref 70–99)
Glucose-Capillary: 152 mg/dL — ABNORMAL HIGH (ref 70–99)

## 2021-03-19 LAB — BASIC METABOLIC PANEL
Anion gap: 6 (ref 5–15)
BUN: 25 mg/dL — ABNORMAL HIGH (ref 8–23)
CO2: 26 mmol/L (ref 22–32)
Calcium: 7.9 mg/dL — ABNORMAL LOW (ref 8.9–10.3)
Chloride: 109 mmol/L (ref 98–111)
Creatinine, Ser: 1.17 mg/dL (ref 0.61–1.24)
GFR, Estimated: >60 mL/min (ref >60)
Glucose, Bld: 125 mg/dL — ABNORMAL HIGH (ref 70–99)
Potassium: 4.3 mmol/L (ref 3.5–5.1)
Sodium: 141 mmol/L (ref 135–145)

## 2021-03-19 LAB — PHOSPHORUS: Phosphorus: 3.2 mg/dL (ref 2.5–4.6)

## 2021-03-19 LAB — MAGNESIUM: Magnesium: 2.2 mg/dL (ref 1.7–2.4)

## 2021-03-19 MED ORDER — LORAZEPAM 2 MG/ML IJ SOLN
1.0000 mg | Freq: Four times a day (QID) | INTRAMUSCULAR | Status: DC | PRN
  Administered 2021-03-20 – 2021-03-26 (×9): 1 mg via INTRAVENOUS
  Filled 2021-03-19 (×10): qty 1

## 2021-03-19 MED ORDER — TRAVASOL 10 % IV SOLN
INTRAVENOUS | Status: AC
  Filled 2021-03-19: qty 1020

## 2021-03-19 MED ORDER — IPRATROPIUM-ALBUTEROL 0.5-2.5 (3) MG/3ML IN SOLN
3.0000 mL | Freq: Four times a day (QID) | RESPIRATORY_TRACT | Status: DC | PRN
  Administered 2021-03-19 – 2021-03-20 (×4): 3 mL via RESPIRATORY_TRACT
  Filled 2021-03-19 (×4): qty 3

## 2021-03-19 NOTE — Progress Notes (Signed)
Pt requested SCD compressions be removed bc he does not feel like they do anything for him and does not want them on. Educated pt on importance;to which he understood but still wanting them removed.

## 2021-03-19 NOTE — Progress Notes (Addendum)
PHARMACY - TOTAL PARENTERAL NUTRITION CONSULT NOTE  Indication: Prolonged ileus  Patient Measurements: Height: 6' (182.9 cm) Weight: 98.9 kg (218 lb 0.6 oz) IBW/kg (Calculated) : 77.6 TPN AdjBW (KG): 82.6 Body mass index is 29.57 kg/m.  Assessment:  56 YOM with lower abdominal pain/N/V found to have perforated diverticulitis. CT abd on 9/3 found to have active ileus/pSBO from diverticulitis/bowel inflammation. Pharmacy consulted to manage TPN for prolonged ileus. Of note patient has a history pf alcohol dependency and is at high risk for refeeding.   Glucose / Insulin: no hx DM, A1c 5.9% - CBGs < 140. Used 6 units SSI in past 24 hrs, 15 units insulin in TPN Electrolytes: Na stable at 141, K 4.3 (goal >/= 4 with ileus; s/p Kcl 10 mEq x 5), Cl down to 109. Other electrolytes wnl.  Renal: SCr up slightly to 1.17, BUN 25 Hepatic: LFTs / tbili / TG WNL, albumin 1.5 Intake / Output; MIVF: NG 975 ml, drain removed, none from colostomy, UOP 0.63ml/kg/hr, LBM 9/6, vomited 9/27. Started on furosemide 20mg  IV daily on 9/30. GI Meds: IV Reglan/6hr, PPI IV/24h ID: Zosyn through 27/07/86 for complicated diverticulitis with IA abscess GI Imaging:  - 9/3 CT abd: Active ileus/PSBO - 9/16 CT abd: L-iliopsoas fluid collection (abscess or hematoma), resolution of prior diverticular abscess, still w/ evidence of ileus GI Surgeries / Procedures:  - 9/10 ex-lap, sigmoid colectomy w/ end colostomy creation, enterorrhaphy, drainage of intra-abd abscess, wound vac placement  Central access: PICC line 02/24/21 TPN start date: 02/25/21  Nutritional Goals, RD Estimated Needs Total Energy Estimated Needs: 2000-2200 Total Protein Estimated Needs: 100-110 grams Total Fluid Estimated Needs: >2L  Current Nutrition:  TPN CLD started 9/23 > NPO on 9/27 with NG to LIWS   Plan:  Continue TPN at goal rate 85 ml/hr to provide 102g AA, 367g CHO and 45g ILE for a total of 2104 kCal, meeting 100% of patient's  needs Electrolytes in TPN: Continue Na 150 mEq/L - monitoring closely for need to reduce. Continue K 40 mEq/L, Ca 2.45mEq/L, Mg 61mEq/L, Phos 57mmol/L. Cl:Ac 1:2 - no changes 10/1 Add standard MVI, trace elements, folate and thiamine to TPN Continue moderate SSI Q4H and 15 units regular insulin in TPN  Standard TPN labs on Mon and Thurs, repeat BMP and Mg tomorrow with diuresis  Cristela Felt, PharmD, BCPS Clinical Pharmacist 03/19/2021 7:16 AM

## 2021-03-19 NOTE — Progress Notes (Signed)
PROGRESS NOTE    Travis Conrad  TOI:[id number] DOB: 05/16/1951 DOA: 02/19/2021 PCP: [name], MD    Brief Narrative:  Travis Conrad was admitted to the hospital with the working diagnosis of severe sepsis due to perforated diverticulitis/ intra-abdominal abscess. Now sp lapartomy, with colectomy, complicated with postoperative ileus.   70 year old male past medical history for atrial fibrillation, dyslipidemia, hypertension and diverticulosis who presented with nausea, vomiting and abdominal pain.  Reported several days of worse abdominal pain, and poor oral intake.  Because of severe symptoms he presented to the hospital.  On his initial physical examination his blood pressure was 84/65, heart rate 57, respiratory rate 20, oxygen saturation 91%, he was ill-looking appearing, heart S1-S2, present, irregular irregular, lungs clear to auscultation, abdomen tender to palpation in the mid abdomen, no lower extremity edema.   CT of the abdomen and pelvis with acute sigmoid diverticulitis with small extraluminal gas and fluid collection in the sigmoid mesocolon, 3.5 x 1.5 x 2.7 cm consistent with perforation and abscess.  Dilated proximal and decompressed distal small bowel loops, possible ileus.   9/4 developed atrial fibrillation with rapid ventricular response, required intravenous amiodarone infusion.   Follow-up CT 9/7 with increased size pelvic abscess. On 9/8 patient underwent CT-guided drainage of pelvic abscess.   09/10 Patient underwent exploratory laparotomy with sigmoid colectomy and diverting colostomy placement.  Postoperative ileus.   Urology and ID have been consulted for epididymitis.   9/19 Postoperative anemia: Required 1 unit packed red blood cells.    Positive left knee pain, positive effusion, underwent left knee aspiration and injection with good toleration.  Fluid with no crystals, gram stain no organisms.   Slowly resolving ileus. Patient had NG tube clamped but  patient vomiting, needing to reconnect tube to suction.     09/26 NG tube has been removed.   09/27 patient with vomiting and abdominal distention, abdominal radiographs with recurrent ileus, NG tube has replaced and connected to low intermittent suction.    Assessment & Plan:   Principal Problem:   Diverticulitis of large intestine with perforation and abscess without bleeding Active Problems:   Diverticulitis of intestine with abscess   Atrial fibrillation, chronic (HCC)   Essential hypertension   Diverticulitis   AKI (acute kidney injury) ([place])   Severe sepsis with acute organ dysfunction (HCC)   Intra-abdominal abscess (HCC)   Epididymitis   Severe sepsis due to perforated diverticulitis, sp colectomy. Left psoas collection. Post operative ileus.  Patient with persistent/ recurrent ileus, NG tube was replaced 03/14/2021.  Ileus managed by general surgery.  CT 03/15/2021 shows continued complex fluid collection along left iliopsoas.  SBO with transition in mid jejunum.  Surgery consulted IR to evaluate for drainage of the left psoas abscess however, IR do not recommend aspirating at this point in time.  He is on Zosyn.  No pain today.  Patient being managed by intermittent NG tube clamping.  Management per surgery.   2. Post operative anemia. sp 1 unit PRBC on 03/07/2021. Hemoglobin remains stable.  Monitor closely   3. Paroxysmal atrial fibrillation/ HTN. Patient was on amiodarone and metoprolol with good rate control. On enoxaparin for anticoagulation, holding DOAC until po intake more consistent.  Due to recurrent ileus, on 03/16/2021, general surgery recommended transitioning all oral or enteral medications to IV route.  Amiodarone and metoprolol was stopped and he was started on IV metoprolol 10 mg daily 8 hours.  Rates are still slightly elevated so he was started on Cardizem  drip on 03/18/2021.  Rate controlled now.  Continue current medications.   4. Right epididymitis resolved,  completed therapy with doxycycline.    5. AKI with hypokalemia/ hyperkalemia,hyponatremia and hypophosphatemia.  Continue TPN per pharmacy protocol.  All electrolytes normal with AKI resolved.   6. Alcohol withdrawal. Resolved.    7. Left knee arthritis. Clinically improved after steroid injection.  Pain is well controlled.   8.  Essential hypertension: Controlled.  Amlodipine discontinued due to recurrent ileus and surgery recommendations to withhold oral medications.  9.  Dyspnea: Patient was complaining of dyspnea this morning when I saw him.  He had audible wheezes.  Questionable faint crackles at the bases as well.  DuoNeb was ordered every 6 hours as needed right away.  Interestingly, despite of wheezing and shortness of breath, his oxygen saturation was 93% on room air.  Ordering chest x-ray.  Recent echo shows normal ejection fraction.  He is on Lasix 20 mg IV daily.  We will follow-up on x-ray and if needed, will provide more diuretics.  Status is: Inpatient  Remains inpatient appropriate because:Inpatient level of care appropriate due to severity of illness  Dispo: The patient is from: Home              Anticipated d/c is to: Home              Patient currently is not medically stable to d/c.   Difficult to place patient No   DVT prophylaxis: Enoxxaparin   Code Status:    full  Family Communication:  Plan discussed with his wife at bedside.    Nutrition Status: Nutrition Problem: Inadequate oral intake Etiology: acute illness (diverticulitis) Signs/Symptoms: per patient/family report, NPO status Interventions: Prostat, Boost Plus    Consultants:  Surgery   Subjective: Patient seen and examined.  Wife at the bedside.  Still with intermittent abdominal pain.  Looks more comfortable compared to yesterday.  No nausea.  Objective: Vitals:   03/18/21 2200 03/18/21 2357 03/19/21 0421 03/19/21 0926  BP: 119/61 (!) 123/54 122/67   Pulse: 99 99    Resp: (!) 29 (!) 28  (!) 28   Temp:  98 F (36.7 C) 97.7 F (36.5 C)   TempSrc:  Axillary Oral   SpO2:    93%  Weight:      Height:        Intake/Output Summary (Last 24 hours) at 03/19/2021 1131 Last data filed at 03/19/2021 1100 Gross per 24 hour  Intake 1827.39 ml  Output 2300 ml  Net -472.61 ml    Filed Weights   03/07/21 0342 03/10/21 0447 03/16/21 0343  Weight: 94 kg 102 kg 98.9 kg    Examination:  General exam: Appears dyspneic Respiratory system: Bilateral diffuse expiratory wheezes, slightly dyspneic and tachypneic Cardiovascular system: S1 & S2 heard, RRR. No JVD, murmurs, rubs, gallops or clicks. No pedal edema. Gastrointestinal system: Abdomen is nondistended, soft and nontender. No organomegaly or masses felt. Normal bowel sounds heard. Central nervous system: Alert and oriented. No focal neurological deficits. Extremities: Symmetric 5 x 5 power. Skin: No rashes, lesions or ulcers.  Psychiatry: Judgement and insight appear normal. Mood & affect appropriate.     Data Reviewed: I have personally reviewed following labs and imaging studies  CBC: Recent Labs  Lab 03/13/21 0209 03/14/21 0636 03/16/21 0920 03/17/21 0442  WBC 7.7 7.4 5.1 5.8  NEUTROABS  --   --  4.1  --   HGB 8.1* 8.8* 8.1* 8.3*  HCT 25.3*  26.3* 26.7* 25.5*  MCV 103.3* 101.5* 112.7* 101.2*  PLT 406* 383 278 599    Basic Metabolic Panel: Recent Labs  Lab 03/14/21 0636 03/16/21 0154 03/17/21 0442 03/18/21 0138 03/19/21 0244  NA 137 138 141 141 141  K 4.0 3.6 3.7 3.5 4.3  CL 105 107 109 112* 109  CO2 26 26 25 23 26   GLUCOSE 148* 158* 144* 132* 125*  BUN 22 23 24* 25* 25*  CREATININE 0.88 1.04 1.15 1.11 1.17  CALCIUM 8.0* 7.9* 7.8* 7.7* 7.9*  MG 2.1 2.1 2.1  --  2.2  PHOS 3.3 3.6 3.1  --  3.2    GFR: Estimated Creatinine Clearance: 71.5 mL/min (by C-G formula based on SCr of 1.17 mg/dL). Liver Function Tests: Recent Labs  Lab 03/14/21 0636 03/17/21 0442  AST 16 30  ALT 18 30  ALKPHOS 84 90   BILITOT 0.6 0.2*  PROT 5.3* 5.1*  ALBUMIN 1.6* 1.5*    No results for input(s): LIPASE, AMYLASE in the last 168 hours. No results for input(s): AMMONIA in the last 168 hours. Coagulation Profile: No results for input(s): INR, PROTIME in the last 168 hours. Cardiac Enzymes: No results for input(s): CKTOTAL, CKMB, CKMBINDEX, TROPONINI in the last 168 hours. BNP (last 3 results) No results for input(s): PROBNP in the last 8760 hours. HbA1C: No results for input(s): HGBA1C in the last 72 hours. CBG: Recent Labs  Lab 03/18/21 1506 03/18/21 2015 03/19/21 0004 03/19/21 0423 03/19/21 0923  GLUCAP 139* 98 110* 124* 135*    Lipid Profile: No results for input(s): CHOL, HDL, LDLCALC, TRIG, CHOLHDL, LDLDIRECT in the last 72 hours.  Thyroid Function Tests: No results for input(s): TSH, T4TOTAL, FREET4, T3FREE, THYROIDAB in the last 72 hours. Anemia Panel: No results for input(s): VITAMINB12, FOLATE, FERRITIN, TIBC, IRON, RETICCTPCT in the last 72 hours.    Radiology Studies: I have reviewed all of the imaging during this hospital visit personally     Scheduled Meds:  Chlorhexidine Gluconate Cloth  6 each Topical Daily   enoxaparin (LOVENOX) injection  100 mg Subcutaneous Q12H   furosemide  20 mg Intravenous Daily   insulin aspart  0-15 Units Subcutaneous Q4H   lidocaine  1 patch Transdermal Q24H   lidocaine  1 patch Transdermal Q24H   metoCLOPramide (REGLAN) injection  10 mg Intravenous Q6H   metoprolol tartrate  10 mg Intravenous Q8H   nicotine  21 mg Transdermal Daily   pantoprazole (PROTONIX) IV  40 mg Intravenous Q12H   sodium chloride flush  10 mL Intracatheter Q12H   Continuous Infusions:  sodium chloride Stopped (03/18/21 1509)   acetaminophen 1,000 mg (03/19/21 0343)   diltiazem (CARDIZEM) infusion 5 mg/hr (03/18/21 1902)   methocarbamol (ROBAXIN) IV 1,000 mg (03/18/21 2113)   piperacillin-tazobactam (ZOSYN)  IV 3.375 g (03/19/21 0951)   TPN ADULT (ION) 85  mL/hr at 03/18/21 1725   TPN ADULT (ION)       LOS: 27 days   Total time spent in minutes: [place], MD [facility] Hospitalist

## 2021-03-19 NOTE — Progress Notes (Signed)
Patient ID: Travis Conrad, male   DOB: 1950-12-31, 70 y.o.   MRN: [id number] [facility] Surgery Progress Note  21 Days Post-Op  Subjective: CC-  No complaints this morning. NG with 975cc out over night. No output from colostomy.  Objective: Vital signs in last 24 hours: Temp:  [97.2 F (36.2 C)-99.3 F (37.4 C)] 97.7 F (36.5 C) (10/01 0421) Pulse Rate:  [96-161] 99 (09/30 2357) Resp:  [17-32] 28 (10/01 0421) BP: (108-170)/(54-84) 122/67 (10/01 0421) SpO2:  [95 %] 95 % (09/30 1446) Last BM Date: 03/18/21  Intake/Output from previous day: 09/30 0701 - 10/01 0700 In: 1827.4 [I.V.:1277; IV Piggyback:550.4] Out: 2975 [Urine:2000; Emesis/NG output:975] Intake/Output this shift: No intake/output data recorded.  PE: Gen:  Alert, NAD, pleasant Pulm:  Normal rate and effort. Abd: Soft, ND, diffuse mild ttp without peritonitis. Stoma appears viable. sweat and very small amount of thin brown stool in pouch. midline wound with VAC present  Lab Results:  Recent Labs    03/16/21 0920 03/17/21 0442  WBC 5.1 5.8  HGB 8.1* 8.3*  HCT 26.7* 25.5*  PLT 278 244   BMET Recent Labs    03/18/21 0138 03/19/21 0244  NA 141 141  K 3.5 4.3  CL 112* 109  CO2 23 26  GLUCOSE 132* 125*  BUN 25* 25*  CREATININE 1.11 1.17  CALCIUM 7.7* 7.9*   PT/INR No results for input(s): LABPROT, INR in the last 72 hours. CMP     Component Value Date/Time   NA 141 03/19/2021 0244   NA 134 09/30/2019 0959   NA 142 03/03/2016 0921   K 4.3 03/19/2021 0244   K 4.3 03/03/2016 0921   CL 109 03/19/2021 0244   CO2 26 03/19/2021 0244   CO2 25 03/03/2016 0921   GLUCOSE 125 (H) 03/19/2021 0244   GLUCOSE 89 03/03/2016 0921   BUN 25 (H) 03/19/2021 0244   BUN 11 09/30/2019 0959   BUN 13.6 03/03/2016 0921   CREATININE 1.17 03/19/2021 0244   CREATININE 1.2 03/03/2016 0921   CALCIUM 7.9 (L) 03/19/2021 0244   CALCIUM 9.6 03/03/2016 0921   PROT 5.1 (L) 03/17/2021 0442   PROT 7.3 03/03/2016 0921    ALBUMIN 1.5 (L) 03/17/2021 0442   ALBUMIN 3.8 03/03/2016 0921   AST 30 03/17/2021 0442   AST 36 (H) 03/03/2016 0921   ALT 30 03/17/2021 0442   ALT 31 03/03/2016 0921   ALKPHOS 90 03/17/2021 0442   ALKPHOS 116 03/03/2016 0921   BILITOT 0.2 (L) 03/17/2021 0442   BILITOT 0.71 03/03/2016 0921   GFRNONAA >60 03/19/2021 0244   GFRNONAA 67 07/25/2013 1712   GFRAA >60 10/03/2019 0930   GFRAA 77 07/25/2013 1712   Lipase     Component Value Date/Time   LIPASE 22 02/19/2021 1653       Studies/Results: No results found.  Anti-infectives: Anti-infectives (From admission, onward)    Start     Dose/Rate Route Frequency Ordered Stop   03/09/21 0800  piperacillin-tazobactam (ZOSYN) IVPB 3.375 g        3.375 g 12.5 mL/hr over 240 Minutes Intravenous Every 8 hours 03/09/21 0706 04/04/21 0759   02/26/21 0100  doxycycline (VIBRAMYCIN) 100 mg in sodium chloride 0.9 % 250 mL IVPB        100 mg 125 mL/hr over 120 Minutes Intravenous 2 times daily 02/26/21 0005 03/08/21 2359   02/20/21 0300  piperacillin-tazobactam (ZOSYN) IVPB 3.375 g        3.375 g 12.5  mL/hr over 240 Minutes Intravenous Every 8 hours 02/19/21 1907 03/08/21 2359   02/19/21 1900  ceFEPIme (MAXIPIME) 2 g in sodium chloride 0.9 % 100 mL IVPB  Status:  Discontinued        2 g 200 mL/hr over 30 Minutes Intravenous  Once 02/19/21 1857 02/19/21 1906   02/19/21 1900  metroNIDAZOLE (FLAGYL) IVPB 500 mg  Status:  Discontinued        500 mg 100 mL/hr over 60 Minutes Intravenous  Once 02/19/21 1857 02/19/21 1906   02/19/21 1815  piperacillin-tazobactam (ZOSYN) IVPB 3.375 g        3.375 g 100 mL/hr over 30 Minutes Intravenous  Once 02/19/21 1809 02/19/21 1944        Assessment/Plan POD 21 s/p ex lap, sigmoid colectomy, end colostomy with enterorrhaphy and drainage of intraabdominal abscess - Dr. Kieth Brightly and Dr. Radene Knee 9/10 for Acute sigmoid diverticulitis with contained perforation and abscess - CT 9/16 demonstrates  heterogenous left iliopsoas collection likely hematoma, stranding in the retroperitoneum and subcu tissue.  Distal colon appears decompressed and very small caliber but there is contrast entering this area; there is additionally a similarly small-caliber segment of colon in the proximal transverse and the ascending colon is not dilated- suspect over all this represents small bowel/gastric ileus.  IR does not recommend draining the hematoma at this point. ID following and recommending Zosyn through 10/17  - NGT replaced 9/27  - CT 9/27 with continued complex fluid collection along left psoas, SBO with transition in mid jejunum, post-surgical changes of colon resection with colostomy - Was started on reglan last weekend. Discussed with him the risk of tardive dyskinesia/EPS on 9/20. Patient stated understanding and is okay with continuing Reglan.  - continue clamping trials with cyclic clamping - put back to suction at 1700 today and leave on suction until 0700 tomorrow AM and record drainage amount in that time frame; if patient becomes nauseated during clamping phase then place back on LIWS - pt may have ice chips and sips of clears with NGT clamped - surgical drain removed yesterday  - WOCN following for new ostomy teaching and vac changes - Cont TPN - Encouraged ambulation (working with therapies), use IS, multimodal pain control  - Cont abx per ID recs   FEN - ok to have sips of clears and ice chips with NGT clamped, NGT clamping, TPN VTE - SCDs, therapeutic lovenox  ID - Zosyn 9/4 - 10/17 (per ID) Foley - out, voiding.    Per primary: A fib - cards following  AKI - resolved ABL anemia -  stable HTN ETOH use L knee pain - per ortho   LOS: 27 days    Wellington [place], [facility] Surgery 03/19/2021, 8:14 AM Please see Amion for pager number during day hours 7:00am-4:30pm

## 2021-03-20 ENCOUNTER — Inpatient Hospital Stay (HOSPITAL_COMMUNITY): Payer: Medicare Other

## 2021-03-20 ENCOUNTER — Other Ambulatory Visit: Payer: Self-pay | Admitting: Cardiology

## 2021-03-20 ENCOUNTER — Other Ambulatory Visit: Payer: Self-pay | Admitting: Internal Medicine

## 2021-03-20 DIAGNOSIS — K5720 Diverticulitis of large intestine with perforation and abscess without bleeding: Secondary | ICD-10-CM | POA: Diagnosis not present

## 2021-03-20 LAB — PROCALCITONIN: Procalcitonin: 0.75 ng/mL

## 2021-03-20 LAB — HEPARIN ANTI-XA: Heparin LMW: 0.61 IU/mL

## 2021-03-20 LAB — BASIC METABOLIC PANEL
Anion gap: 8 (ref 5–15)
BUN: 28 mg/dL — ABNORMAL HIGH (ref 8–23)
CO2: 26 mmol/L (ref 22–32)
Calcium: 7.8 mg/dL — ABNORMAL LOW (ref 8.9–10.3)
Chloride: 108 mmol/L (ref 98–111)
Creatinine, Ser: 1.14 mg/dL (ref 0.61–1.24)
GFR, Estimated: >60 mL/min (ref >60)
Glucose, Bld: 144 mg/dL — ABNORMAL HIGH (ref 70–99)
Potassium: 3.7 mmol/L (ref 3.5–5.1)
Sodium: 142 mmol/L (ref 135–145)

## 2021-03-20 LAB — GLUCOSE, CAPILLARY
Glucose-Capillary: 130 mg/dL — ABNORMAL HIGH (ref 70–99)
Glucose-Capillary: 170 mg/dL — ABNORMAL HIGH (ref 70–99)
Glucose-Capillary: 136 mg/dL — ABNORMAL HIGH (ref 70–99)
Glucose-Capillary: 141 mg/dL — ABNORMAL HIGH (ref 70–99)
Glucose-Capillary: 164 mg/dL — ABNORMAL HIGH (ref 70–99)

## 2021-03-20 LAB — CBC
HCT: 26 % — ABNORMAL LOW (ref 39.0–52.0)
Hemoglobin: 8.1 g/dL — ABNORMAL LOW (ref 13.0–17.0)
MCH: 31.5 pg (ref 26.0–34.0)
MCHC: 31.2 g/dL (ref 30.0–36.0)
MCV: 101.2 fL — ABNORMAL HIGH (ref 80.0–100.0)
Platelets: 198 10*3/uL (ref 150–400)
RBC: 2.57 MIL/uL — ABNORMAL LOW (ref 4.22–5.81)
RDW: 14.8 % (ref 11.5–15.5)
WBC: 11.7 10*3/uL — ABNORMAL HIGH (ref 4.0–10.5)
nRBC: 0.3 % — ABNORMAL HIGH (ref 0.0–0.2)

## 2021-03-20 LAB — MAGNESIUM: Magnesium: 2.2 mg/dL (ref 1.7–2.4)

## 2021-03-20 MED ORDER — FUROSEMIDE 10 MG/ML IJ SOLN
20.0000 mg | Freq: Once | INTRAMUSCULAR | Status: AC
  Administered 2021-03-20: 20 mg via INTRAVENOUS

## 2021-03-20 MED ORDER — METHYLPREDNISOLONE SODIUM SUCC 125 MG IJ SOLR
125.0000 mg | Freq: Once | INTRAMUSCULAR | Status: AC
  Administered 2021-03-20: 125 mg via INTRAVENOUS

## 2021-03-20 MED ORDER — TRAVASOL 10 % IV SOLN
INTRAVENOUS | Status: AC
  Filled 2021-03-20: qty 1020

## 2021-03-20 MED ORDER — METHYLPREDNISOLONE SODIUM SUCC 125 MG IJ SOLR
125.0000 mg | Freq: Once | INTRAMUSCULAR | Status: DC
  Filled 2021-03-20: qty 2

## 2021-03-20 MED ORDER — IPRATROPIUM-ALBUTEROL 0.5-2.5 (3) MG/3ML IN SOLN
3.0000 mL | RESPIRATORY_TRACT | Status: DC | PRN
  Administered 2021-03-21 – 2021-04-05 (×4): 3 mL via RESPIRATORY_TRACT
  Filled 2021-03-20 (×5): qty 3

## 2021-03-20 MED ORDER — POTASSIUM CHLORIDE 10 MEQ/50ML IV SOLN
10.0000 meq | INTRAVENOUS | Status: AC
Start: 2021-03-20 — End: 2021-03-20
  Administered 2021-03-20 (×3): 10 meq via INTRAVENOUS
  Filled 2021-03-20 (×3): qty 50

## 2021-03-20 MED ORDER — FUROSEMIDE 10 MG/ML IJ SOLN
20.0000 mg | Freq: Once | INTRAMUSCULAR | Status: DC
  Filled 2021-03-20: qty 2

## 2021-03-20 NOTE — Progress Notes (Signed)
During reassessment on pt. Pt's right arm appeared to be more swollen than it was in the morning. MD Pahwani notified. Orders were to keep right arm elevated.

## 2021-03-20 NOTE — Significant Event (Addendum)
Rapid Response Event Note   Reason for Call :  Wheezing  Initial Focused Assessment:  Called by the RT who had just seen this patient. She reported that the patient has a prominent audible expiratory wheeze. Duoneb x 2 as well as solumedrol 125mg  x 1 given to patient. The patient does not appear to be in respiratory distress. RN put patient on supplemental oxygen.  The patient is currently in an irregular afib rhythm with rates in the 100-140's. The patient is currently receiving IV diltiazem gtt as well as scheduled metoprolol   Interventions:  Duoneb x 2 Solumedrol 125 mg x 1 Chest xray  Plan of Care:  Patient's wheeze has gotten better after administration of breathing treatments and IV steroids.    Event Summary:   MD Notified: Hospitalist Call Time: 6728 Arrival Time: [place] End Time: [place], RN

## 2021-03-20 NOTE — Progress Notes (Signed)
Patient ID: Travis Conrad, male   DOB: Oct 13, 1950, 69 y.o.   MRN: [id number] [facility] Surgery Progress Note  22 Days Post-Op  Subjective: CC-  Feels the same. Denies any significant abdominal pain, n/v at this time. 2L out from NG tube last 24 hours. Tolerating clamping during the day. Minimal stool from colostomy.  Objective: Vital signs in last 24 hours: Temp:  [97.7 F (36.5 C)-99.3 F (37.4 C)] 99.3 F (37.4 C) (10/02 0309) Pulse Rate:  [112] 112 (10/02 0309) Resp:  [20-41] 25 (10/02 0626) BP: (102-147)/(46-76) 113/56 (10/02 0626) SpO2:  [90 %-93 %] 90 % (10/01 2100) Last BM Date: 03/19/21  Intake/Output from previous day: 10/01 0701 - 10/02 0700 In: 2059.8 [I.V.:1210.2; IV Piggyback:849.6] Out: 4200 [Urine:2100; Emesis/NG output:2000; Drains:100] Intake/Output this shift: No intake/output data recorded.  PE: Gen:  Alert, NAD, pleasant Pulm:  Normal rate and effort. Abd: Soft, ND, diffuse mild ttp without peritonitis. Stoma appears viable. sweat and small amount of thin dark brown stool in pouch. midline wound with VAC present  Lab Results:  Recent Labs    03/20/21 0439  WBC 11.7*  HGB 8.1*  HCT 26.0*  PLT 198   BMET Recent Labs    03/19/21 0244 03/20/21 0439  NA 141 142  K 4.3 3.7  CL 109 108  CO2 26 26  GLUCOSE 125* 144*  BUN 25* 28*  CREATININE 1.17 1.14  CALCIUM 7.9* 7.8*   PT/INR No results for input(s): LABPROT, INR in the last 72 hours. CMP     Component Value Date/Time   NA 142 03/20/2021 0439   NA 134 09/30/2019 0959   NA 142 03/03/2016 0921   K 3.7 03/20/2021 0439   K 4.3 03/03/2016 0921   CL 108 03/20/2021 0439   CO2 26 03/20/2021 0439   CO2 25 03/03/2016 0921   GLUCOSE 144 (H) 03/20/2021 0439   GLUCOSE 89 03/03/2016 0921   BUN 28 (H) 03/20/2021 0439   BUN 11 09/30/2019 0959   BUN 13.6 03/03/2016 0921   CREATININE 1.14 03/20/2021 0439   CREATININE 1.2 03/03/2016 0921   CALCIUM 7.8 (L) 03/20/2021 0439   CALCIUM 9.6  03/03/2016 0921   PROT 5.1 (L) 03/17/2021 0442   PROT 7.3 03/03/2016 0921   ALBUMIN 1.5 (L) 03/17/2021 0442   ALBUMIN 3.8 03/03/2016 0921   AST 30 03/17/2021 0442   AST 36 (H) 03/03/2016 0921   ALT 30 03/17/2021 0442   ALT 31 03/03/2016 0921   ALKPHOS 90 03/17/2021 0442   ALKPHOS 116 03/03/2016 0921   BILITOT 0.2 (L) 03/17/2021 0442   BILITOT 0.71 03/03/2016 0921   GFRNONAA >60 03/20/2021 0439   GFRNONAA 67 07/25/2013 1712   GFRAA >60 10/03/2019 0930   GFRAA 77 07/25/2013 1712   Lipase     Component Value Date/Time   LIPASE 22 02/19/2021 1653       Studies/Results: DG CHEST PORT 1 VIEW  Result Date: 03/19/2021 CLINICAL DATA:  Dyspnea. EXAM: PORTABLE CHEST 1 VIEW COMPARISON:  03/04/2021 FINDINGS: Nasogastric tube is in place, with side port in the region of the gastroesophageal junction. RIGHT-sided PICC line has been placed, tip overlying the level of superior vena cava. There is minimal bibasilar atelectasis, increased compared with prior study. No new consolidations or pulmonary edema. IMPRESSION: Increased bibasilar atelectasis.  Repositioned nasogastric tube. Electronically Signed   By: Travis Conrad M.D.   On: 03/19/2021 16:52    Anti-infectives: Anti-infectives (From admission, onward)    Start  Dose/Rate Route Frequency Ordered Stop   03/09/21 0800  piperacillin-tazobactam (ZOSYN) IVPB 3.375 g        3.375 g 12.5 mL/hr over 240 Minutes Intravenous Every 8 hours 03/09/21 0706 04/04/21 0759   02/26/21 0100  doxycycline (VIBRAMYCIN) 100 mg in sodium chloride 0.9 % 250 mL IVPB        100 mg 125 mL/hr over 120 Minutes Intravenous 2 times daily 02/26/21 0005 03/08/21 2359   02/20/21 0300  piperacillin-tazobactam (ZOSYN) IVPB 3.375 g        3.375 g 12.5 mL/hr over 240 Minutes Intravenous Every 8 hours 02/19/21 1907 03/08/21 2359   02/19/21 1900  ceFEPIme (MAXIPIME) 2 g in sodium chloride 0.9 % 100 mL IVPB  Status:  Discontinued        2 g 200 mL/hr over 30 Minutes  Intravenous  Once 02/19/21 1857 02/19/21 1906   02/19/21 1900  metroNIDAZOLE (FLAGYL) IVPB 500 mg  Status:  Discontinued        500 mg 100 mL/hr over 60 Minutes Intravenous  Once 02/19/21 1857 02/19/21 1906   02/19/21 1815  piperacillin-tazobactam (ZOSYN) IVPB 3.375 g        3.375 g 100 mL/hr over 30 Minutes Intravenous  Once 02/19/21 1809 02/19/21 1944        Assessment/Plan POD 22 s/p ex lap, sigmoid colectomy, end colostomy with enterorrhaphy and drainage of intraabdominal abscess - Dr. Kieth Conrad and Dr. Radene Conrad 9/10 for Acute sigmoid diverticulitis with contained perforation and abscess - CT 9/16 demonstrates heterogenous left iliopsoas collection likely hematoma, stranding in the retroperitoneum and subcu tissue.  Distal colon appears decompressed and very small caliber but there is contrast entering this area; there is additionally a similarly small-caliber segment of colon in the proximal transverse and the ascending colon is not dilated- suspect over all this represents small bowel/gastric ileus.  IR does not recommend draining the hematoma at this point. ID following and recommending Zosyn through 10/17  - NGT replaced 9/27  - CT 9/27 with continued complex fluid collection along left psoas, SBO with transition in mid jejunum, post-surgical changes of colon resection with colostomy - Was started on reglan last weekend. Discussed with him the risk of tardive dyskinesia/EPS on 9/20. Patient stated understanding and is okay with continuing Reglan.  - Gastrograffin study 9/28 showed Stable gaseous distention of the small bowel, oral contrast progressed into the colon - continue clamping trials with cyclic clamping - put back to suction at 1700 today and leave on suction until 0700 tomorrow AM and record drainage amount in that time frame; if patient becomes nauseated during clamping phase then place back on LIWS - pt may have ice chips and sips of clears with NGT clamped - surgical drain  removed yesterday  - WOCN following for new ostomy teaching and vac changes - Cont TPN - Encouraged ambulation (working with therapies), use IS, multimodal pain control  - Cont abx per ID recs   FEN - ok to have sips of clears and ice chips with NGT clamped, NGT clamping, TPN VTE - SCDs, therapeutic lovenox  ID - Zosyn 9/4 - 10/17 (per ID) Foley - out, voiding.    Per primary: A fib - cards following  AKI - resolved ABL anemia -  stable HTN ETOH use L Conrad pain - per ortho   LOS: 28 days    [place], [facility] Surgery 03/20/2021, 8:40 AM Please see Amion for pager number during day hours 7:00am-4:30pm

## 2021-03-20 NOTE — Progress Notes (Signed)
PROGRESS NOTE    NICHOLSON STARACE  WUJ:[id number] DOB: 06-Nov-1950 DOA: 02/19/2021 PCP: [name], MD    Brief Narrative:  Mr. Holberg was admitted to the hospital with the working diagnosis of severe sepsis due to perforated diverticulitis/ intra-abdominal abscess. Now sp lapartomy, with colectomy, complicated with postoperative ileus.   70 year old male past medical history for atrial fibrillation, dyslipidemia, hypertension and diverticulosis who presented with nausea, vomiting and abdominal pain.  Reported several days of worse abdominal pain, and poor oral intake.  Because of severe symptoms he presented to the hospital.  On his initial physical examination his blood pressure was 84/65, heart rate 57, respiratory rate 20, oxygen saturation 91%, he was ill-looking appearing, heart S1-S2, present, irregular irregular, lungs clear to auscultation, abdomen tender to palpation in the mid abdomen, no lower extremity edema.   CT of the abdomen and pelvis with acute sigmoid diverticulitis with small extraluminal gas and fluid collection in the sigmoid mesocolon, 3.5 x 1.5 x 2.7 cm consistent with perforation and abscess.  Dilated proximal and decompressed distal small bowel loops, possible ileus.   9/4 developed atrial fibrillation with rapid ventricular response, required intravenous amiodarone infusion.   Follow-up CT 9/7 with increased size pelvic abscess. On 9/8 patient underwent CT-guided drainage of pelvic abscess.   09/10 Patient underwent exploratory laparotomy with sigmoid colectomy and diverting colostomy placement.  Postoperative ileus.   Urology and ID have been consulted for epididymitis.   9/19 Postoperative anemia: Required 1 unit packed red blood cells.    Positive left knee pain, positive effusion, underwent left knee aspiration and injection with good toleration.  Fluid with no crystals, gram stain no organisms.   Slowly resolving ileus. Patient had NG tube clamped but  patient vomiting, needing to reconnect tube to suction.     09/26 NG tube has been removed.   09/27 patient with vomiting and abdominal distention, abdominal radiographs with recurrent ileus, NG tube has replaced and connected to low intermittent suction.    Assessment & Plan:   Principal Problem:   Diverticulitis of large intestine with perforation and abscess without bleeding Active Problems:   Diverticulitis of intestine with abscess   Atrial fibrillation, chronic (HCC)   Essential hypertension   Diverticulitis   AKI (acute kidney injury) ([place])   Severe sepsis with acute organ dysfunction (HCC)   Intra-abdominal abscess (HCC)   Epididymitis   Severe sepsis due to perforated diverticulitis, sp colectomy. Left psoas collection. Post operative ileus.  Patient with persistent/ recurrent ileus, NG tube was replaced 03/14/2021.  Ileus managed by general surgery.  CT 03/15/2021 shows continued complex fluid collection along left iliopsoas.  SBO with transition in mid jejunum.  Surgery consulted IR to evaluate for drainage of the left psoas abscess however, IR do not recommend aspirating at this point in time.  He is on Zosyn.  No pain today.  S/p Gastrografin study on 03/16/2021.  Patient being managed by intermittent NG tube clamping.  Management per surgery.   2. Post operative anemia. sp 1 unit PRBC on 03/07/2021. Hemoglobin remains stable.  Monitor closely   3. Paroxysmal atrial fibrillation/ HTN. Patient was on amiodarone and metoprolol with good rate control. On enoxaparin for anticoagulation, holding DOAC until po intake more consistent.  Due to recurrent ileus, on 03/16/2021, general surgery recommended transitioning all oral or enteral medications to IV route.  Amiodarone and metoprolol was stopped and he was started on IV metoprolol 10 mg daily 8 hours.  Rates around 100 now.  He remains on scheduled Lopressor 10 mg IV every 8 hours and Cardizem drip.   4. Right epididymitis  resolved, completed therapy with doxycycline.    5. AKI with hypokalemia/ hyperkalemia,hyponatremia and hypophosphatemia.  Continue TPN per pharmacy protocol.  All electrolytes normal with AKI resolved.   6. Alcohol withdrawal. Resolved.    7. Left knee arthritis. Clinically improved after steroid injection.  Pain is well controlled.   8.  Essential hypertension: Controlled.  Amlodipine discontinued due to recurrent ileus and surgery recommendations to withhold oral medications.  9.  Dyspnea/atelectasis: Still complains of shortness of breath, still wheezy but much improved compared to yesterday.  Chest x-ray shows atelectasis but no pulmonary edema or pneumonia.  Incentive spirometry provided and educated to use every hour.  Continue DuoNeb.  Status is: Inpatient  Remains inpatient appropriate because:Inpatient level of care appropriate due to severity of illness  Dispo: The patient is from: Home              Anticipated d/c is to: Home              Patient currently is not medically stable to d/c.   Difficult to place patient No   DVT prophylaxis: Enoxxaparin   Code Status:    full  Family Communication:  Plan discussed with his wife at bedside.    Nutrition Status: Nutrition Problem: Inadequate oral intake Etiology: acute illness (diverticulitis) Signs/Symptoms: per patient/family report, NPO status Interventions: Prostat, Boost Plus    Consultants:  Surgery   Subjective: Patient seen and examined.  He states that he is feeling better with some shortness of breath but much improved compared to yesterday.  No new complaint.  Objective: Vitals:   03/20/21 0400 03/20/21 0500 03/20/21 0626 03/20/21 0937  BP: 125/61 (!) 132/59 (!) 113/56 120/65  Pulse:    100  Resp: (!) 33 (!) 27 (!) 25 (!) 25  Temp:    98 F (36.7 C)  TempSrc:    Oral  SpO2:    90%  Weight:      Height:        Intake/Output Summary (Last 24 hours) at 03/20/2021 1040 Last data filed at 03/20/2021  5409 Gross per 24 hour  Intake 2059.82 ml  Output 4200 ml  Net -2140.18 ml    Filed Weights   03/07/21 0342 03/10/21 0447 03/16/21 0343  Weight: 94 kg 102 kg 98.9 kg    Examination:  General exam: Appears calm and comfortable  Respiratory system: Bilateral expiratory wheezes. Respiratory effort normal. Cardiovascular system: S1 & S2 heard, irregularly irregular rate and rhythm. No JVD, murmurs, rubs, gallops or clicks.  +1 pitting edema bilateral lower extremity Gastrointestinal system: Abdomen is nondistended, soft and nontender. No organomegaly or masses felt. Normal bowel sounds heard. Central nervous system: Alert and oriented. No focal neurological deficits. Extremities: Symmetric 5 x 5 power. Skin: No rashes, lesions or ulcers.  Psychiatry: Judgement and insight appear normal. Mood & affect appropriate.    Data Reviewed: I have personally reviewed following labs and imaging studies  CBC: Recent Labs  Lab 03/14/21 0636 03/16/21 0920 03/17/21 0442 03/20/21 0439  WBC 7.4 5.1 5.8 11.7*  NEUTROABS  --  4.1  --   --   HGB 8.8* 8.1* 8.3* 8.1*  HCT 26.3* 26.7* 25.5* 26.0*  MCV 101.5* 112.7* 101.2* 101.2*  PLT 383 278 244 811    Basic Metabolic Panel: Recent Labs  Lab 03/14/21 0636 03/16/21 0154 03/17/21 0442 03/18/21 0138 03/19/21 0244 03/20/21  0439  NA 137 138 141 141 141 142  K 4.0 3.6 3.7 3.5 4.3 3.7  CL 105 107 109 112* 109 108  CO2 26 26 25 23 26 26   GLUCOSE 148* 158* 144* 132* 125* 144*  BUN 22 23 24* 25* 25* 28*  CREATININE 0.88 1.04 1.15 1.11 1.17 1.14  CALCIUM 8.0* 7.9* 7.8* 7.7* 7.9* 7.8*  MG 2.1 2.1 2.1  --  2.2 2.2  PHOS 3.3 3.6 3.1  --  3.2  --     GFR: Estimated Creatinine Clearance: 73.4 mL/min (by C-G formula based on SCr of 1.14 mg/dL). Liver Function Tests: Recent Labs  Lab 03/14/21 0636 03/17/21 0442  AST 16 30  ALT 18 30  ALKPHOS 84 90  BILITOT 0.6 0.2*  PROT 5.3* 5.1*  ALBUMIN 1.6* 1.5*    No results for input(s): LIPASE,  AMYLASE in the last 168 hours. No results for input(s): AMMONIA in the last 168 hours. Coagulation Profile: No results for input(s): INR, PROTIME in the last 168 hours. Cardiac Enzymes: No results for input(s): CKTOTAL, CKMB, CKMBINDEX, TROPONINI in the last 168 hours. BNP (last 3 results) No results for input(s): PROBNP in the last 8760 hours. HbA1C: No results for input(s): HGBA1C in the last 72 hours. CBG: Recent Labs  Lab 03/19/21 1633 03/19/21 2201 03/19/21 2319 03/20/21 0307 03/20/21 0733  GLUCAP 152* 127* 118* 130* 170*    Lipid Profile: No results for input(s): CHOL, HDL, LDLCALC, TRIG, CHOLHDL, LDLDIRECT in the last 72 hours.  Thyroid Function Tests: No results for input(s): TSH, T4TOTAL, FREET4, T3FREE, THYROIDAB in the last 72 hours. Anemia Panel: No results for input(s): VITAMINB12, FOLATE, FERRITIN, TIBC, IRON, RETICCTPCT in the last 72 hours.    Radiology Studies: I have reviewed all of the imaging during this hospital visit personally     Scheduled Meds:  Chlorhexidine Gluconate Cloth  6 each Topical Daily   enoxaparin (LOVENOX) injection  100 mg Subcutaneous Q12H   furosemide  20 mg Intravenous Daily   insulin aspart  0-15 Units Subcutaneous Q4H   lidocaine  1 patch Transdermal Q24H   lidocaine  1 patch Transdermal Q24H   metoCLOPramide (REGLAN) injection  10 mg Intravenous Q6H   metoprolol tartrate  10 mg Intravenous Q8H   nicotine  21 mg Transdermal Daily   pantoprazole (PROTONIX) IV  40 mg Intravenous Q12H   sodium chloride flush  10 mL Intracatheter Q12H   Continuous Infusions:  sodium chloride 10 mL/hr at 03/20/21 0311   diltiazem (CARDIZEM) infusion 5 mg/hr (03/20/21 0311)   methocarbamol (ROBAXIN) IV 1,000 mg (03/20/21 0656)   piperacillin-tazobactam (ZOSYN)  IV 3.375 g (03/20/21 0813)   potassium chloride 10 mEq (03/20/21 0926)   TPN ADULT (ION) 85 mL/hr at 03/20/21 0311   TPN ADULT (ION)       LOS: 28 days   Total time spent in  minutes: [place], MD [facility] Hospitalist

## 2021-03-20 NOTE — Progress Notes (Signed)
ANTICOAGULATION CONSULT NOTE - Follow Up Consult  Pharmacy Consult for Lovenox Indication: atrial fibrillation  Allergies  Allergen Reactions   Atorvastatin Other (See Comments)    myalgias    Patient Measurements: Height: 6' (182.9 cm) Weight: 98.9 kg (218 lb 0.6 oz) IBW/kg (Calculated) : 77.6  Vital Signs: Temp: 99.3 F (37.4 C) (10/02 0309) Temp Source: Axillary (10/02 0309) BP: 113/56 (10/02 0626) Pulse Rate: 112 (10/02 0309)  Labs: Recent Labs    03/18/21 0138 03/19/21 0244 03/20/21 0439  HGB  --   --  8.1*  HCT  --   --  26.0*  PLT  --   --  198  CREATININE 1.11 1.17 1.14     Estimated Creatinine Clearance: 73.4 mL/min (by C-G formula based on SCr of 1.14 mg/dL).   Assessment: 70 y/o male admitted for sepsis from contained perforated diverticulitis and intra-abdominal abscess with active ileus. On PTA Xarelto for Afib with last dose on 9/2 at 2000.  Pharmacy consulted for Lovenox due to post operative ileus.   CT 9/27 with continued complex fluid collection along left psoas. Per IR note no aspiration/drainage recommended. Patient is still NPO and cannot transition to an oral agent at this time. Missed 10/1 AM enoxaparin dose. Since he missed his dose, he has recent postoperative anemia requiring PRBC, and his level is technically therapeutic, keep at current dose and reassess anti-xa level if needed.  -hg= 8.1, SCr= 1.14, peak Anti-xa = 0.61 (~3h after AM dose)   Goal of Therapy:  Anti-Xa level Goal:  0.6-1 units/mL 4 hours post dose Monitor platelets by anticoagulation protocol: Yes   Plan:  Lovenox 100 mg SQ q12h Reassess anti-xa level as needed CBC at least q72h while on Lovenox F/U ability to transition to oral agent   Varney Daily, PharmD PGY1 Pharmacy Resident  Please check AMION for all [facility] pharmacy phone numbers. After 10:00 PM call main pharmacy [phone]

## 2021-03-20 NOTE — Progress Notes (Signed)
PHARMACY - TOTAL PARENTERAL NUTRITION CONSULT NOTE  Indication: Prolonged ileus  Patient Measurements: Height: 6' (182.9 cm) Weight: 98.9 kg (218 lb 0.6 oz) IBW/kg (Calculated) : 77.6 TPN AdjBW (KG): 82.6 Body mass index is 29.57 kg/m.  Assessment:  1 YOM with lower abdominal pain/N/V found to have perforated diverticulitis. CT abd on 9/3 found to have active ileus/pSBO from diverticulitis/bowel inflammation. Pharmacy consulted to manage TPN for prolonged ileus. Of note patient has a history pf alcohol dependency and is at high risk for refeeding.   Glucose / Insulin: no hx DM, A1c 5.9% - CBGs 118-152. Used 7 units SSI in past 24 hrs, 15 units insulin in TPN Electrolytes: Na 142 (stable), K 3.7 (goal >/= 4 with ileus), Cl down to 108. Other electrolytes wnl.  Renal: SCr up slightly to 1.14, BUN 28 Hepatic: LFTs / tbili / TG WNL, albumin 1.5 Intake / Output; MIVF: NG 2000 ml, drain removed, none from colostomy, UOP 0.84ml/kg/hr + 2 additional occurrences, vomited 9/27. Started on furosemide 20mg  IV daily on 9/30. GI Meds: IV Reglan/6hr, PPI IV/24h ID: Zosyn through 31/51/76 for complicated diverticulitis with IA abscess GI Imaging:  - 9/3 CT abd: Active ileus/PSBO - 9/16 CT abd: L-iliopsoas fluid collection (abscess or hematoma), resolution of prior diverticular abscess, still w/ evidence of ileus - 9/27 CT abd: continued retroperitoneal complex fluid collection, SBO GI Surgeries / Procedures:  - 9/10 ex-lap, sigmoid colectomy w/ end colostomy creation, enterorrhaphy, drainage of intra-abd abscess, wound vac placement  Central access: PICC line 02/24/21 TPN start date: 02/25/21  Nutritional Goals, RD Estimated Needs Total Energy Estimated Needs: 2000-2200 Total Protein Estimated Needs: 100-110 grams Total Fluid Estimated Needs: >2L  Current Nutrition:  TPN CLD started 9/23 > NPO on 9/27 with NG to LIWS   Plan:  Continue TPN at goal rate 85 ml/hr to provide 102g AA, 367g CHO and  45g ILE for a total of 2104 kCal, meeting 100% of patient's needs Electrolytes in TPN: Continue Na 150 mEq/L - monitoring closely for need to reduce. Continue K 40 mEq/L, Ca 2.51mEq/L, Mg 60mEq/L, Phos 4mmol/L. Cl:Ac 1:2 - no changes 10/2 Continue K 40 mEq/L (~82 mEq) in TPN and add KCl 10 mEq x3 for replacement since diuresing. Add standard MVI, trace elements, folate and thiamine to TPN Continue moderate SSI Q4H and 15 units regular insulin in TPN  Standard TPN labs on Mon and [place], PharmD, BCPS Clinical Pharmacist 03/20/2021 7:07 AM

## 2021-03-20 NOTE — Plan of Care (Signed)
  Problem: Health Behavior/Discharge Planning: Goal: Ability to manage health-related needs will improve Outcome: Progressing   

## 2021-03-21 DIAGNOSIS — K5720 Diverticulitis of large intestine with perforation and abscess without bleeding: Secondary | ICD-10-CM | POA: Diagnosis not present

## 2021-03-21 LAB — COMPREHENSIVE METABOLIC PANEL
ALT: 31 U/L (ref 0–44)
AST: 27 U/L (ref 15–41)
Albumin: <1.5 g/dL — ABNORMAL LOW (ref 3.5–5.0)
Alkaline Phosphatase: 75 U/L (ref 38–126)
Anion gap: 7 (ref 5–15)
BUN: 33 mg/dL — ABNORMAL HIGH (ref 8–23)
CO2: 27 mmol/L (ref 22–32)
Calcium: 7.7 mg/dL — ABNORMAL LOW (ref 8.9–10.3)
Chloride: 105 mmol/L (ref 98–111)
Creatinine, Ser: 1.07 mg/dL (ref 0.61–1.24)
GFR, Estimated: >60 mL/min (ref >60)
Glucose, Bld: 245 mg/dL — ABNORMAL HIGH (ref 70–99)
Potassium: 3.8 mmol/L (ref 3.5–5.1)
Sodium: 139 mmol/L (ref 135–145)
Total Bilirubin: 0.5 mg/dL (ref 0.3–1.2)
Total Protein: 5.6 g/dL — ABNORMAL LOW (ref 6.5–8.1)

## 2021-03-21 LAB — GLUCOSE, CAPILLARY
Glucose-Capillary: 141 mg/dL — ABNORMAL HIGH (ref 70–99)
Glucose-Capillary: 169 mg/dL — ABNORMAL HIGH (ref 70–99)
Glucose-Capillary: 184 mg/dL — ABNORMAL HIGH (ref 70–99)
Glucose-Capillary: 184 mg/dL — ABNORMAL HIGH (ref 70–99)
Glucose-Capillary: 185 mg/dL — ABNORMAL HIGH (ref 70–99)
Glucose-Capillary: 212 mg/dL — ABNORMAL HIGH (ref 70–99)
Glucose-Capillary: 218 mg/dL — ABNORMAL HIGH (ref 70–99)

## 2021-03-21 LAB — PHOSPHORUS: Phosphorus: 3.3 mg/dL (ref 2.5–4.6)

## 2021-03-21 LAB — MAGNESIUM: Magnesium: 2.3 mg/dL (ref 1.7–2.4)

## 2021-03-21 LAB — TRIGLYCERIDES: Triglycerides: 186 mg/dL — ABNORMAL HIGH (ref <150)

## 2021-03-21 MED ORDER — TRAVASOL 10 % IV SOLN
INTRAVENOUS | Status: AC
  Filled 2021-03-21: qty 1020

## 2021-03-21 MED ORDER — FUROSEMIDE 10 MG/ML IJ SOLN
40.0000 mg | Freq: Every day | INTRAMUSCULAR | Status: DC
  Administered 2021-03-21 – 2021-03-25 (×5): 40 mg via INTRAVENOUS
  Filled 2021-03-21 (×5): qty 4

## 2021-03-21 MED ORDER — POTASSIUM CHLORIDE 10 MEQ/50ML IV SOLN
10.0000 meq | INTRAVENOUS | Status: AC
  Administered 2021-03-21 (×4): 10 meq via INTRAVENOUS
  Filled 2021-03-21 (×4): qty 50

## 2021-03-21 MED ORDER — METHYLPREDNISOLONE SODIUM SUCC 40 MG IJ SOLR
40.0000 mg | Freq: Two times a day (BID) | INTRAMUSCULAR | Status: DC
  Administered 2021-03-21 – 2021-03-23 (×5): 40 mg via INTRAVENOUS
  Filled 2021-03-21 (×5): qty 1

## 2021-03-21 NOTE — Progress Notes (Signed)
Occupational Therapy Treatment Patient Details Name: Travis Conrad MRN: [id number] DOB: 1950-07-30 Today's Date: 03/21/2021   History of present illness 70 yo admitted 9/3 with abdominal pain, vomiting and ileus. 9/4 Afib with RVR. 9/8 intrabdominal abscess s/p IR drain placed. 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 Pt found to have lt iliopsoas hematoma on CT 9/16.  9/20 left knee aspiration. 9/26 NG tube removed. 9/27 NGT replaced due to vomiting. PMhx: HTN, HLD, Afib, ETOH use   OT comments  Nursing states patient has increased shortness of breath but could benefit from OT. Patient seen by skilled OT to address bed mobility and self care sitting on eob. Patient performed hair washing with mod assist with shampoo bag.  Patient performed grooming while sitting on eob often asking to rest due to UE fatigue. Patient was mod assist to return to supine.  Acute OT to continue to follow.    Recommendations for follow up therapy are one component of a multi-disciplinary discharge planning process, led by the attending physician.  Recommendations may be updated based on patient status, additional functional criteria and insurance authorization.    Follow Up Recommendations  Home health OT    Equipment Recommendations  3 in 1 bedside commode    Recommendations for Other Services      Precautions / Restrictions Precautions Precautions: Fall;Other (comment) Precaution Comments: wound vAC,  NGT       Mobility Bed Mobility Overal bed mobility: Needs Assistance Bed Mobility: Supine to Sit;Sit to Supine     Supine to sit: Min assist Sit to supine: Mod assist   General bed mobility comments: Patient required more assistance for sit to supine due to patient's compaints of fatigue    Transfers                      Balance Overall balance assessment: Needs assistance Sitting-balance support: Feet supported;No upper extremity  supported Sitting balance-Leahy Scale: Good Sitting balance - Comments: sat on eob for 30+ minutes                                   ADL either performed or assessed with clinical judgement   ADL Overall ADL's : Needs assistance/impaired     Grooming: Wash/dry hands;Wash/dry face;Oral care;Brushing hair Grooming Details (indicate cue type and reason): performed while sitting on eob                               General ADL Comments: performed hair washing, wife assisted with shaving, and grooming performed seated on eob     Vision       Perception     Praxis      Cognition Arousal/Alertness: Awake/alert Behavior During Therapy: Anxious Overall Cognitive Status: Impaired/Different from baseline Area of Impairment: Attention;Memory;Following commands;Safety/judgement                   Current Attention Level: Sustained Memory: Decreased short-term memory Following Commands: Follows one step commands consistently Safety/Judgement: Decreased awareness of deficits     General Comments: patient asking to remove O2 during self care  and advised  on need        Exercises     Shoulder Instructions       General Comments      Pertinent Vitals/ Pain  Pain Assessment: Faces Faces Pain Scale: Hurts little more Pain Location: abdomen Pain Descriptors / Indicators: Aching;Guarding Pain Intervention(s): Monitored during session;Repositioned  Home Living                                          Prior Functioning/Environment              Frequency  Min 2X/week        Progress Toward Goals  OT Goals(current goals can now be found in the care plan section)  Progress towards OT goals: Progressing toward goals  Acute Rehab OT Goals Patient Stated Goal: return home and be able to walk OT Goal Formulation: With patient Time For Goal Achievement: 03/23/21 Potential to Achieve Goals: Good ADL Goals Pt  Will Perform Grooming: with modified independence;standing Pt Will Perform Lower Body Bathing: with supervision;with set-up;sit to/from stand Pt Will Perform Lower Body Dressing: with set-up;with supervision;with adaptive equipment Pt Will Transfer to Toilet: with supervision;ambulating Pt Will Perform Toileting - Clothing Manipulation and hygiene: with supervision;sitting/lateral leans;sit to/from stand Additional ADL Goal #1: Pt will independently verbalize 3 strateiges to reduce risk of falls  Plan Discharge plan remains appropriate    Co-evaluation                 AM-PAC OT "6 Clicks" Daily Activity     Outcome Measure   Help from another person eating meals?: Total Help from another person taking care of personal grooming?: A Little Help from another person toileting, which includes using toliet, bedpan, or urinal?: A Lot Help from another person bathing (including washing, rinsing, drying)?: A Lot Help from another person to put on and taking off regular upper body clothing?: A Little Help from another person to put on and taking off regular lower body clothing?: Total 6 Click Score: 12    End of Session    OT Visit Diagnosis: Muscle weakness (generalized) (M62.81);Pain   Activity Tolerance Patient tolerated treatment well   Patient Left in bed;with call bell/phone within reach;with bed alarm set;with family/visitor present   Nurse Communication Patient requests pain meds        Time: 6286-3817 OT Time Calculation (min): 47 min  Charges: OT General Charges $OT Visit: 1 Visit OT Treatments $Self Care/Home Management : 38-52 mins  Lodema Hong, OTA   Denman Pichardo Alexis Goodell 03/21/2021, 10:39 AM

## 2021-03-21 NOTE — Telephone Encounter (Signed)
Prescription refill request for Xarelto received.  Indication:Afib  Last office visit: 04/06/20 ([place])  Weight: 98.9kg Age: 70 Scr: 1.07 (03/21/21) CrCl: 89.32ml/min  Appropriate dose and refill sent to requested pharmacy.

## 2021-03-21 NOTE — Progress Notes (Signed)
Progress Note  23 Days Post-Op  Subjective: Patient reports stable abdominal pain, feels like he starts to have some cramping around 1600 most days. He has been tolerating cyclic clamping though. He reports he had a good night last night.   Objective: Vital signs in last 24 hours: Temp:  [97.6 F (36.4 C)-98 F (36.7 C)] 97.6 F (36.4 C) (10/03 0722) Pulse Rate:  [88-112] 88 (10/03 0722) Resp:  [17-25] 17 (10/03 0722) BP: (100-120)/(58-66) 101/58 (10/03 0722) SpO2:  [90 %-95 %] 95 % (10/03 0722) Last BM Date: 03/20/21  Intake/Output from previous day: 10/02 0701 - 10/03 0700 In: 1614 [P.O.:420; I.V.:793.7; IV Piggyback:400.3] Out: 2400 [Urine:1000; Emesis/NG output:1400] Intake/Output this shift: No intake/output data recorded.  PE: Gen:  Alert, NAD, pleasant Pulm:  Normal rate and effort. Abd: Soft, ND, diffuse mild ttp without peritonitis. Stoma appears viable. Dark thick stool present in ostomy appliance. midline wound with VAC present   Lab Results:  Recent Labs    03/20/21 0439  WBC 11.7*  HGB 8.1*  HCT 26.0*  PLT 198   BMET Recent Labs    03/20/21 0439 03/21/21 0406  NA 142 139  K 3.7 3.8  CL 108 105  CO2 26 27  GLUCOSE 144* 245*  BUN 28* 33*  CREATININE 1.14 1.07  CALCIUM 7.8* 7.7*   PT/INR No results for input(s): LABPROT, INR in the last 72 hours. CMP     Component Value Date/Time   NA 139 03/21/2021 0406   NA 134 09/30/2019 0959   NA 142 03/03/2016 0921   K 3.8 03/21/2021 0406   K 4.3 03/03/2016 0921   CL 105 03/21/2021 0406   CO2 27 03/21/2021 0406   CO2 25 03/03/2016 0921   GLUCOSE 245 (H) 03/21/2021 0406   GLUCOSE 89 03/03/2016 0921   BUN 33 (H) 03/21/2021 0406   BUN 11 09/30/2019 0959   BUN 13.6 03/03/2016 0921   CREATININE 1.07 03/21/2021 0406   CREATININE 1.2 03/03/2016 0921   CALCIUM 7.7 (L) 03/21/2021 0406   CALCIUM 9.6 03/03/2016 0921   PROT 5.6 (L) 03/21/2021 0406   PROT 7.3 03/03/2016 0921   ALBUMIN <1.5 (L)  03/21/2021 0406   ALBUMIN 3.8 03/03/2016 0921   AST 27 03/21/2021 0406   AST 36 (H) 03/03/2016 0921   ALT 31 03/21/2021 0406   ALT 31 03/03/2016 0921   ALKPHOS 75 03/21/2021 0406   ALKPHOS 116 03/03/2016 0921   BILITOT 0.5 03/21/2021 0406   BILITOT 0.71 03/03/2016 0921   GFRNONAA >60 03/21/2021 0406   GFRNONAA 67 07/25/2013 1712   GFRAA >60 10/03/2019 0930   GFRAA 77 07/25/2013 1712   Lipase     Component Value Date/Time   LIPASE 22 02/19/2021 1653       Studies/Results: DG CHEST PORT 1 VIEW  Result Date: 03/20/2021 CLINICAL DATA:  Hypoxia. EXAM: PORTABLE CHEST 1 VIEW COMPARISON:  Chest radiograph dated 03/19/2021. FINDINGS: The heart size is normal. Vascular calcifications are seen in the aortic arch. A right upper extremity peripherally inserted central venous catheter tip overlies the superior vena cava. An enteric tube terminates in the stomach. Mild bilateral lower lung predominant interstitial opacities and atelectasis may be increased since prior exam. No pleural effusion or pneumothorax. Degenerative changes are seen in the spine. IMPRESSION: Mild bilateral lower lung predominant interstitial opacities and atelectasis may be increased. Aortic Atherosclerosis (ICD10-I70.0). Electronically Signed   By: Zerita Boers M.D.   On: 03/20/2021 18:11   DG CHEST  PORT 1 VIEW  Result Date: 03/19/2021 CLINICAL DATA:  Dyspnea. EXAM: PORTABLE CHEST 1 VIEW COMPARISON:  03/04/2021 FINDINGS: Nasogastric tube is in place, with side port in the region of the gastroesophageal junction. RIGHT-sided PICC line has been placed, tip overlying the level of superior vena cava. There is minimal bibasilar atelectasis, increased compared with prior study. No new consolidations or pulmonary edema. IMPRESSION: Increased bibasilar atelectasis.  Repositioned nasogastric tube. Electronically Signed   By: Nolon Nations M.D.   On: 03/19/2021 16:52    Anti-infectives: Anti-infectives (From admission, onward)     Start     Dose/Rate Route Frequency Ordered Stop   03/09/21 0800  piperacillin-tazobactam (ZOSYN) IVPB 3.375 g        3.375 g 12.5 mL/hr over 240 Minutes Intravenous Every 8 hours 03/09/21 0706 04/04/21 0759   02/26/21 0100  doxycycline (VIBRAMYCIN) 100 mg in sodium chloride 0.9 % 250 mL IVPB        100 mg 125 mL/hr over 120 Minutes Intravenous 2 times daily 02/26/21 0005 03/08/21 2359   02/20/21 0300  piperacillin-tazobactam (ZOSYN) IVPB 3.375 g        3.375 g 12.5 mL/hr over 240 Minutes Intravenous Every 8 hours 02/19/21 1907 03/08/21 2359   02/19/21 1900  ceFEPIme (MAXIPIME) 2 g in sodium chloride 0.9 % 100 mL IVPB  Status:  Discontinued        2 g 200 mL/hr over 30 Minutes Intravenous  Once 02/19/21 1857 02/19/21 1906   02/19/21 1900  metroNIDAZOLE (FLAGYL) IVPB 500 mg  Status:  Discontinued        500 mg 100 mL/hr over 60 Minutes Intravenous  Once 02/19/21 1857 02/19/21 1906   02/19/21 1815  piperacillin-tazobactam (ZOSYN) IVPB 3.375 g        3.375 g 100 mL/hr over 30 Minutes Intravenous  Once 02/19/21 1809 02/19/21 1944        Assessment/Plan POD 23 s/p ex lap, sigmoid colectomy, end colostomy with enterorrhaphy and drainage of intraabdominal abscess - Dr. Kieth Brightly and Dr. Radene Knee 9/10 for Acute sigmoid diverticulitis with contained perforation and abscess - CT 9/16 demonstrates heterogenous left iliopsoas collection likely hematoma, stranding in the retroperitoneum and subcu tissue.  Distal colon appears decompressed and very small caliber but there is contrast entering this area; there is additionally a similarly small-caliber segment of colon in the proximal transverse and the ascending colon is not dilated- suspect over all this represents small bowel/gastric ileus.  IR does not recommend draining the hematoma at this point. ID following and recommending Zosyn through 10/17  - NGT replaced 9/27  - CT 9/27 with continued complex fluid collection along left psoas, SBO with  transition in mid jejunum, post-surgical changes of colon resection with colostomy - Was started on reglan last weekend. Discussed with him the risk of tardive dyskinesia/EPS on 9/20. Patient stated understanding and is okay with continuing Reglan.  - Gastrograffin study 9/28 showed Stable gaseous distention of the small bowel, oral contrast progressed into the colon - continue clamping trials with cyclic clamping - put back to suction at 1900 today and leave on suction until 0700 tomorrow AM and record drainage amount in that time frame; if patient becomes nauseated during clamping phase then place back on LIWS - pt may have ice chips and sips of clears with NGT clamped - WOCN following for new ostomy teaching and vac changes - Cont TPN - Encouraged ambulation (working with therapies), use IS, multimodal pain control  - Cont abx per  ID recs   FEN - ok to have sips of clears and ice chips with NGT clamped, NGT clamping, TPN VTE - SCDs, therapeutic lovenox  ID - Zosyn 9/4 - 10/17 (per ID) Foley - out, voiding.    Per primary: A fib - cards following  AKI - resolved ABL anemia -  stable HTN ETOH use L knee pain - per ortho  LOS: 29 days    Norm Parcel, [facility] Surgery 03/21/2021, 9:25 AM Please see Amion for pager number during day hours 7:00am-4:30pm

## 2021-03-21 NOTE — Consult Note (Signed)
[place] Nurse wound follow up Patient receiving care in [facility] 2W01 Wound type: Surgical midline incision Wound bed: Clean, pink granulation tissue Drainage (amount, consistency, odor) Sanguinous in canister Periwound: intact Dressing procedure/placement/frequency: One piece of black foam dressing removed. One piece of black foam replaced. Drape applied, immediate seal obtained at 125 mmHg WOC to follow M/W/F   [place] Nurse ostomy follow up Stoma type/location: LUQ colostomy Stomal assessment/size: 1 3/8" red budded just above the level of the skin. Sutures in place.  Peristomal assessment: intact Treatment options for stomal/peristomal skin: Barrier ring Output: emptied 100cc of dark green/black sticky stool. Ostomy pouching: 1pc. convex Education provided: Wife not present for vac and pouch change today. Enrolled patient in [place] Start Discharge program: Yes (previously) Wife states SS kit received.    Cathlean Marseilles Tamala Julian, MSN, RN, [place], [name], [facility] Wound Treatment Associate Pager [phone]

## 2021-03-21 NOTE — Progress Notes (Addendum)
PHARMACY - TOTAL PARENTERAL NUTRITION CONSULT NOTE  Indication: Prolonged ileus  Patient Measurements: Height: 6' (182.9 cm) Weight: 98.9 kg (218 lb 0.6 oz) IBW/kg (Calculated) : 77.6 TPN AdjBW (KG): 82.6 Body mass index is 29.57 kg/m.  Assessment:  17 YOM with lower abdominal pain/N/V found to have perforated diverticulitis. CT abd on 9/3 found to have active ileus/pSBO from diverticulitis/bowel inflammation. Pharmacy consulted to manage TPN for prolonged ileus. Of note patient has a history pf alcohol dependency and is at high risk for refeeding.   Glucose / Insulin: no hx DM, A1c 5.9% - CBGs 160-245/24hr. Used 18 units SSI in past 24 hrs, 15 units insulin in TPN. Patient received methylprednisone 125mg  x1 on 10/2 leading to increased CBGs acutely. CTM Electrolytes: Na 139 (stable), K 3.8 (goal >/= 4 with ileus), Cl down to 105. Other electrolytes wnl.  Renal: SCr trending down to 1.07, BUN 33 Hepatic: LFTs / tbili  WNL, TG 86>>186 (CTM), albumin <1.5 Intake / Output; MIVF: NG 1200 ml/24hr, clamping trials ongoing, drain removed, none from colostomy, UOP 0.80ml/kg/hr, lasix 40mg  IV on 10/3. Continue lasix 20mg  IV daily GI Meds: IV Reglan/6hr, PPI IV/24h ID: Zosyn through 71/69/67 for complicated diverticulitis with IA abscess GI Imaging:  - 9/3 CT abd: Active ileus/PSBO - 9/16 CT abd: L-iliopsoas fluid collection (abscess or hematoma), resolution of prior diverticular abscess, still w/ evidence of ileus - 9/27 CT abd: continued retroperitoneal complex fluid collection, SBO GI Surgeries / Procedures:  - 9/10 ex-lap, sigmoid colectomy w/ end colostomy creation, enterorrhaphy, drainage of intra-abd abscess, wound vac placement  Central access: PICC line 02/24/21 TPN start date: 02/25/21  Nutritional Goals, RD Estimated Needs Total Energy Estimated Needs: 2000-2200 Total Protein Estimated Needs: 100-110 grams Total Fluid Estimated Needs: >2L  Current Nutrition:  TPN CLD started 9/23 >  NPO on 9/27 with NG to LIWS   Plan:  Continue TPN at goal rate 85 ml/hr to provide 102g AA, 367g CHO and 45g ILE for a total of 2104 kCal, meeting 100% of patient's needs Electrolytes in TPN: Continue Na 150 mEq/L - monitoring closely for need to reduce. Increase to K 45 mEq/L, Ca 2.34mEq/L, Mg 45mEq/L, Phos 39mmol/L. Cl:Ac 1:2  Will give Kcl IV 41mEq x4. Monitor K closely while diuresis is ongoing. May need to reduce in bag if no longer diuresing TG trending up 86>>186. CTM closely Add standard MVI, trace elements, folate and thiamine to TPN Continue moderate SSI Q4H and 15 units regular insulin in TPN                  -CBGs elevated secondary to steroid administration 10/2 Standard TPN labs on Mon and Thurs  Thank you for allowing pharmacy to be a part of this patient's care.  Donnald Garre, PharmD Clinical Pharmacist  Please check AMION for all [facility] numbers After 10:00 PM, call [place] [phone]

## 2021-03-21 NOTE — Progress Notes (Signed)
PROGRESS NOTE    Travis Conrad  LSL:[id number] DOB: 02-11-1951 DOA: 02/19/2021 PCP: [name], MD    Brief Narrative:  Mr. Nauert was admitted to the hospital with the working diagnosis of severe sepsis due to perforated diverticulitis/ intra-abdominal abscess. Now sp lapartomy, with colectomy, complicated with postoperative ileus.   70 year old male past medical history for atrial fibrillation, dyslipidemia, hypertension and diverticulosis who presented with nausea, vomiting and abdominal pain.  Reported several days of worse abdominal pain, and poor oral intake.  Because of severe symptoms he presented to the hospital.  On his initial physical examination his blood pressure was 84/65, heart rate 57, respiratory rate 20, oxygen saturation 91%, he was ill-looking appearing, heart S1-S2, present, irregular irregular, lungs clear to auscultation, abdomen tender to palpation in the mid abdomen, no lower extremity edema.   CT of the abdomen and pelvis with acute sigmoid diverticulitis with small extraluminal gas and fluid collection in the sigmoid mesocolon, 3.5 x 1.5 x 2.7 cm consistent with perforation and abscess.  Dilated proximal and decompressed distal small bowel loops, possible ileus.   9/4 developed atrial fibrillation with rapid ventricular response, required intravenous amiodarone infusion.   Follow-up CT 9/7 with increased size pelvic abscess. On 9/8 patient underwent CT-guided drainage of pelvic abscess.   09/10 Patient underwent exploratory laparotomy with sigmoid colectomy and diverting colostomy placement.  Postoperative ileus.   Urology and ID have been consulted for epididymitis.   9/19 Postoperative anemia: Required 1 unit packed red blood cells.    Positive left knee pain, positive effusion, underwent left knee aspiration and injection with good toleration.  Fluid with no crystals, gram stain no organisms.   Slowly resolving ileus. Patient had NG tube clamped but  patient vomiting, needing to reconnect tube to suction.     09/26 NG tube has been removed.   09/27 patient with vomiting and abdominal distention, abdominal radiographs with recurrent ileus, NG tube has replaced and connected to low intermittent suction.    Assessment & Plan:   Principal Problem:   Diverticulitis of large intestine with perforation and abscess without bleeding Active Problems:   Diverticulitis of intestine with abscess   Atrial fibrillation, chronic (HCC)   Essential hypertension   Diverticulitis   AKI (acute kidney injury) ([place])   Severe sepsis with acute organ dysfunction (HCC)   Intra-abdominal abscess (HCC)   Epididymitis   Severe sepsis due to perforated diverticulitis, sp colectomy. Left psoas collection. Post operative ileus.  Patient with persistent/ recurrent ileus, NG tube was replaced 03/14/2021.  Ileus managed by general surgery.  CT 03/15/2021 shows continued complex fluid collection along left iliopsoas.  SBO with transition in mid jejunum.  Surgery consulted IR to evaluate for drainage of the left psoas abscess however, IR do not recommend aspirating at this point in time.  He is on Zosyn.  No pain today.  S/p Gastrografin study on 03/16/2021.  Patient being managed by intermittent NG tube clamping.  Management per surgery.   2. Post operative anemia. sp 1 unit PRBC on 03/07/2021. Hemoglobin remains stable.  Monitor closely   3. Paroxysmal atrial fibrillation/ HTN. Patient was on amiodarone and metoprolol with good rate control. On enoxaparin for anticoagulation, holding DOAC until po intake more consistent.  Due to recurrent ileus, on 03/16/2021, general surgery recommended transitioning all oral or enteral medications to IV route.  Amiodarone and metoprolol was stopped and he was started on IV metoprolol 10 mg daily 8 hours.  Patient's rates are controlled around  100 and he is only on 5 mg of Cardizem.  Due to significant wheezes, I am discontinuing IV  metoprolol and will continue diltiazem with nurses instructed to titrate up if needed with a goal rate between 70-110.   4. Right epididymitis resolved, completed therapy with doxycycline.    5. AKI with hypokalemia/ hyperkalemia,hyponatremia and hypophosphatemia.  Continue TPN per pharmacy protocol.  All electrolytes normal with AKI resolved.   6. Alcohol withdrawal. Resolved.    7. Left knee arthritis. Clinically improved after steroid injection.  Pain is well controlled.   8.  Essential hypertension: Controlled.  Amlodipine discontinued due to recurrent ileus and surgery recommendations to withhold oral medications.  9.  Dyspnea/atelectasis acute hypoxic respiratory failure: Patient now requiring 2 L of oxygen and is still has audible wheezes.  Chest x-ray once again was repeated yesterday which once again confirmed atelectasis.  Patient has been provided with incentive spirometry but not using as instructed.  Will increase Lasix to 20 mg IV daily and initiate low-dose Solu-Medrol and continue DuoNeb every 4 hours.  Patient advised once again to use incentive spirometry frequently, every hour.  Wife at the bedside who mentioned that she will help as well.  Patient will benefit from getting up out of the bed, either sitting in the recliner or walking.  Ambulate in the hall every shift orders are also in place.  Monitor closely.  Discontinue metoprolol as mentioned above.  Status is: Inpatient  Remains inpatient appropriate because:Inpatient level of care appropriate due to severity of illness  Dispo: The patient is from: Home              Anticipated d/c is to: Home              Patient currently is not medically stable to d/c.   Difficult to place patient No   DVT prophylaxis: Enoxxaparin   Code Status:    full  Family Communication:  Plan discussed with his wife at bedside.    Nutrition Status: Nutrition Problem: Inadequate oral intake Etiology: acute illness  (diverticulitis) Signs/Symptoms: per patient/family report, NPO status Interventions: Prostat, Boost Plus    Consultants:  Surgery   Subjective: Patient seen and examined.  Wife and primary RN at the bedside.  Patient is still complains of shortness of breath and headache and just not feeling well.  Denies any chest pain or palpitation.  Although primary RN informed that patient earlier was having some hallucinations and was confused and was talking to dead people in the room but during my encounter, patient was alert and oriented.  Objective: Vitals:   03/21/21 0100 03/21/21 0400 03/21/21 0722 03/21/21 1000  BP: 110/64 100/61 (!) 101/58   Pulse: 100 98 88 (!) 104  Resp: 18 20 17  (!) 23  Temp:  98 F (36.7 C) 97.6 F (36.4 C)   TempSrc:  Oral Oral   SpO2: 92% 92% 95% 96%  Weight:      Height:        Intake/Output Summary (Last 24 hours) at 03/21/2021 1058 Last data filed at 03/21/2021 0613 Gross per 24 hour  Intake 1613.98 ml  Output 2400 ml  Net -786.02 ml    Filed Weights   03/07/21 0342 03/10/21 0447 03/16/21 0343  Weight: 94 kg 102 kg 98.9 kg    Examination:  General exam: Appears calm and comfortable  Respiratory system: Audible wheezes bilaterally. Respiratory effort normal. Cardiovascular system: S1 & S2 heard, irregularly irregular rate and rhythm. No JVD,  murmurs, rubs, gallops or clicks.  +1 pitting edema bilateral lower extremity. Gastrointestinal system: Abdomen is nondistended, soft and nontender. No organomegaly or masses felt. Normal bowel sounds heard. Central nervous system: Alert and oriented. No focal neurological deficits. Extremities: Symmetric 5 x 5 power. Skin: No rashes, lesions or ulcers.  Psychiatry: Judgement and insight appear poor  Data Reviewed: I have personally reviewed following labs and imaging studies  CBC: Recent Labs  Lab 03/16/21 0920 03/17/21 0442 03/20/21 0439  WBC 5.1 5.8 11.7*  NEUTROABS 4.1  --   --   HGB 8.1* 8.3*  8.1*  HCT 26.7* 25.5* 26.0*  MCV 112.7* 101.2* 101.2*  PLT 278 244 630    Basic Metabolic Panel: Recent Labs  Lab 03/16/21 0154 03/17/21 0442 03/18/21 0138 03/19/21 0244 03/20/21 0439 03/21/21 0406  NA 138 141 141 141 142 139  K 3.6 3.7 3.5 4.3 3.7 3.8  CL 107 109 112* 109 108 105  CO2 26 25 23 26 26 27   GLUCOSE 158* 144* 132* 125* 144* 245*  BUN 23 24* 25* 25* 28* 33*  CREATININE 1.04 1.15 1.11 1.17 1.14 1.07  CALCIUM 7.9* 7.8* 7.7* 7.9* 7.8* 7.7*  MG 2.1 2.1  --  2.2 2.2 2.3  PHOS 3.6 3.1  --  3.2  --  3.3    GFR: Estimated Creatinine Clearance: 78.2 mL/min (by C-G formula based on SCr of 1.07 mg/dL). Liver Function Tests: Recent Labs  Lab 03/17/21 0442 03/21/21 0406  AST 30 27  ALT 30 31  ALKPHOS 90 75  BILITOT 0.2* 0.5  PROT 5.1* 5.6*  ALBUMIN 1.5* <1.5*    No results for input(s): LIPASE, AMYLASE in the last 168 hours. No results for input(s): AMMONIA in the last 168 hours. Coagulation Profile: No results for input(s): INR, PROTIME in the last 168 hours. Cardiac Enzymes: No results for input(s): CKTOTAL, CKMB, CKMBINDEX, TROPONINI in the last 168 hours. BNP (last 3 results) No results for input(s): PROBNP in the last 8760 hours. HbA1C: No results for input(s): HGBA1C in the last 72 hours. CBG: Recent Labs  Lab 03/20/21 1613 03/20/21 2034 03/21/21 0020 03/21/21 0430 03/21/21 0721  GLUCAP 141* 164* 185* 218* 212*    Lipid Profile: Recent Labs    03/21/21 0406  TRIG 186*    Thyroid Function Tests: No results for input(s): TSH, T4TOTAL, FREET4, T3FREE, THYROIDAB in the last 72 hours. Anemia Panel: No results for input(s): VITAMINB12, FOLATE, FERRITIN, TIBC, IRON, RETICCTPCT in the last 72 hours.   Radiology Studies: I have reviewed all of the imaging during this hospital visit personally  Scheduled Meds:  Chlorhexidine Gluconate Cloth  6 each Topical Daily   enoxaparin (LOVENOX) injection  100 mg Subcutaneous Q12H   furosemide  40 mg  Intravenous Daily   insulin aspart  0-15 Units Subcutaneous Q4H   lidocaine  1 patch Transdermal Q24H   lidocaine  1 patch Transdermal Q24H   methylPREDNISolone (SOLU-MEDROL) injection  40 mg Intravenous Q12H   metoCLOPramide (REGLAN) injection  10 mg Intravenous Q6H   nicotine  21 mg Transdermal Daily   pantoprazole (PROTONIX) IV  40 mg Intravenous Q12H   sodium chloride flush  10 mL Intracatheter Q12H   Continuous Infusions:  sodium chloride 10 mL/hr at 03/20/21 0311   diltiazem (CARDIZEM) infusion 5 mg/hr (03/20/21 1546)   methocarbamol (ROBAXIN) IV 1,000 mg (03/21/21 1601)   piperacillin-tazobactam (ZOSYN)  IV 3.375 g (03/21/21 0914)   potassium chloride 10 mEq (03/21/21 1048)   TPN  ADULT (ION) 85 mL/hr at 03/21/21 0305   TPN ADULT (ION)       LOS: 29 days   Total time spent in minutes: [place] Dezza, MD [facility] Hospitalist

## 2021-03-21 NOTE — Progress Notes (Signed)
Pt noncompliant with NPO orders, pt allows ice to melt and drink water while on ng tube is connected to suction. Education was given and removed cup from beside pt became very agitated and upset states he with discuss this with MD.

## 2021-03-22 ENCOUNTER — Inpatient Hospital Stay (HOSPITAL_COMMUNITY): Payer: Medicare Other

## 2021-03-22 ENCOUNTER — Ambulatory Visit: Payer: Medicare Other | Admitting: Physician Assistant

## 2021-03-22 DIAGNOSIS — K5720 Diverticulitis of large intestine with perforation and abscess without bleeding: Secondary | ICD-10-CM | POA: Diagnosis not present

## 2021-03-22 LAB — GLUCOSE, CAPILLARY
Glucose-Capillary: 173 mg/dL — ABNORMAL HIGH (ref 70–99)
Glucose-Capillary: 227 mg/dL — ABNORMAL HIGH (ref 70–99)
Glucose-Capillary: 206 mg/dL — ABNORMAL HIGH (ref 70–99)
Glucose-Capillary: 171 mg/dL — ABNORMAL HIGH (ref 70–99)
Glucose-Capillary: 173 mg/dL — ABNORMAL HIGH (ref 70–99)

## 2021-03-22 MED ORDER — KETOROLAC TROMETHAMINE 15 MG/ML IJ SOLN
15.0000 mg | Freq: Four times a day (QID) | INTRAMUSCULAR | Status: AC
  Administered 2021-03-22 – 2021-03-27 (×20): 15 mg via INTRAVENOUS
  Filled 2021-03-22 (×20): qty 1

## 2021-03-22 MED ORDER — IOHEXOL 350 MG/ML SOLN
100.0000 mL | Freq: Once | INTRAVENOUS | Status: AC | PRN
  Administered 2021-03-22: 100 mL via INTRAVENOUS

## 2021-03-22 MED ORDER — TRAVASOL 10 % IV SOLN
INTRAVENOUS | Status: AC
  Filled 2021-03-22: qty 1020

## 2021-03-22 MED ORDER — IOHEXOL 9 MG/ML PO SOLN
ORAL | Status: AC
  Administered 2021-03-22: 500 mL
  Filled 2021-03-22: qty 1000

## 2021-03-22 NOTE — Progress Notes (Addendum)
PROGRESS NOTE    Travis Conrad  VFI:[id number] DOB: 1951/02/19 DOA: 02/19/2021 PCP: [name], MD    Brief Narrative:  Travis Conrad was admitted to the hospital with the working diagnosis of severe sepsis due to perforated diverticulitis/ intra-abdominal abscess. Now sp lapartomy, with colectomy, complicated with postoperative ileus.   70 year old male past medical history for atrial fibrillation, dyslipidemia, hypertension and diverticulosis who presented with nausea, vomiting and abdominal pain.  Reported several days of worse abdominal pain, and poor oral intake.  Because of severe symptoms he presented to the hospital.  On his initial physical examination his blood pressure was 84/65, heart rate 57, respiratory rate 20, oxygen saturation 91%, he was ill-looking appearing, heart S1-S2, present, irregular irregular, lungs clear to auscultation, abdomen tender to palpation in the mid abdomen, no lower extremity edema.   CT of the abdomen and pelvis with acute sigmoid diverticulitis with small extraluminal gas and fluid collection in the sigmoid mesocolon, 3.5 x 1.5 x 2.7 cm consistent with perforation and abscess.  Dilated proximal and decompressed distal small bowel loops, possible ileus.   9/4 developed atrial fibrillation with rapid ventricular response, required intravenous amiodarone infusion.   Follow-up CT 9/7 with increased size pelvic abscess. On 9/8 patient underwent CT-guided drainage of pelvic abscess.   09/10 Patient underwent exploratory laparotomy with sigmoid colectomy and diverting colostomy placement.  Postoperative ileus.   Urology and ID have been consulted for epididymitis.   9/19 Postoperative anemia: Required 1 unit packed red blood cells.    Positive left knee pain, positive effusion, underwent left knee aspiration and injection with good toleration.  Fluid with no crystals, gram stain no organisms.   Slowly resolving ileus. Patient had NG tube clamped but  patient vomiting, needing to reconnect tube to suction.     09/26 NG tube has been removed.   09/27 patient with vomiting and abdominal distention, abdominal radiographs with recurrent ileus, NG tube has replaced and connected to low intermittent suction.    Assessment & Plan:   Principal Problem:   Diverticulitis of large intestine with perforation and abscess without bleeding Active Problems:   Diverticulitis of intestine with abscess   Atrial fibrillation, chronic (HCC)   Essential hypertension   Diverticulitis   AKI (acute kidney injury) ([place])   Severe sepsis with acute organ dysfunction (HCC)   Intra-abdominal abscess (HCC)   Epididymitis   Severe sepsis due to perforated diverticulitis, sp colectomy. Left psoas collection. Post operative ileus.  Patient with persistent/ recurrent ileus, NG tube was replaced 03/14/2021.  Ileus managed by general surgery.  CT 03/15/2021 shows continued complex fluid collection along left iliopsoas.  SBO with transition in mid jejunum.  Surgery consulted IR to evaluate for drainage of the left psoas abscess however, IR do not recommend aspirating at this point in time.  No pain today.  S/p Gastrografin study on 03/16/2021.  Patient being managed by intermittent NG tube clamping.  Remains on TPN.  Management per surgery.  They are considering repeating CT scan today.  Patient remains on Zosyn with end date of 03/25/2021 as recommended by ID.  ID had documented in their latest note to notify them if patient happens to be here through 03/24/2021.  I have notified Dr. Linus Salmons today.   2. Post operative anemia. sp 1 unit PRBC on 03/07/2021. Hemoglobin remains stable.  Monitor closely   3. Paroxysmal atrial fibrillation/ HTN. Patient was on amiodarone and metoprolol with good rate control. On enoxaparin for anticoagulation, holding DOAC until  po intake more consistent.  Due to recurrent ileus, on 03/16/2021, general surgery recommended transitioning all oral or  enteral medications to IV route.  Amiodarone and metoprolol was stopped and he was started on IV metoprolol 10 mg daily 8 hours.  Patient's rates are controlled around 100 and he is only on 5 mg of Cardizem.  Due to significant wheezes, IV metoprolol was also discontinued on 03/21/2021.  Rates are still under control.   4. Right epididymitis resolved, completed therapy with doxycycline.    5. AKI with hypokalemia/ hyperkalemia,hyponatremia and hypophosphatemia.  Continue TPN per pharmacy protocol.  All electrolytes normal with AKI resolved.   6. Alcohol withdrawal. Resolved.    7. Left knee arthritis. Clinically improved after steroid injection.  Pain is well controlled.   8.  Essential hypertension: Controlled.  Amlodipine discontinued due to recurrent ileus and surgery recommendations to withhold oral medications.  9.  Dyspnea/atelectasis acute hypoxic respiratory failure: Patient feels much better.  No more wheezes at all.  He is off of oxygen.  Once again, I encouraged continuing to use incentive spirometry every hour religiously.  We will continue Solu-Medrol and bronchodilators for another day.  Likely discontinue Solu-Medrol tomorrow if he continues to improve.    Status is: Inpatient  Remains inpatient appropriate because:Inpatient level of care appropriate due to severity of illness  Dispo: The patient is from: Home              Anticipated d/c is to: Home              Patient currently is not medically stable to d/c.   Difficult to place patient No   DVT prophylaxis: Enoxxaparin   Code Status:    full  Family Communication:  Plan discussed with his wife at bedside.    Nutrition Status: Nutrition Problem: Inadequate oral intake Etiology: acute illness (diverticulitis) Signs/Symptoms: per patient/family report, NPO status Interventions: Prostat, Boost Plus    Consultants:  Surgery   Subjective: Patient seen and examined.  He is sitting in the recliner.  Wife at the  bedside.  He states that he is feeling much better.  Still has some intermittent shortness of breath but feels much better than yesterday.  Still has intermittent abdominal pain.  No other complaint.  Objective: Vitals:   03/22/21 0757 03/22/21 0800 03/22/21 0929 03/22/21 1115  BP:  137/72  136/67  Pulse: 91 86 (!) 110 90  Resp: 18 18  (!) 24  Temp:  98 F (36.7 C)  98.1 F (36.7 C)  TempSrc:  Oral  Oral  SpO2: 94% 100% 94% 94%  Weight:      Height:        Intake/Output Summary (Last 24 hours) at 03/22/2021 1118 Last data filed at 03/22/2021 0805 Gross per 24 hour  Intake 2004.58 ml  Output 3350 ml  Net -1345.42 ml    Filed Weights   03/07/21 0342 03/10/21 0447 03/16/21 0343  Weight: 94 kg 102 kg 98.9 kg    Examination:  General exam: Appears calm and comfortable  Respiratory system: Clear to auscultation. Respiratory effort normal. Cardiovascular system: S1 & S2 heard, RRR. No JVD, murmurs, rubs, gallops or clicks.  +1 pitting edema bilateral lower extremity Gastrointestinal system: Abdomen is nondistended, soft and mild generalized abdominal tenderness. No organomegaly or masses felt. Normal bowel sounds heard. Central nervous system: Alert and oriented. No focal neurological deficits. Extremities: Symmetric 5 x 5 power. Skin: No rashes, lesions or ulcers.   Data Reviewed:  I have personally reviewed following labs and imaging studies  CBC: Recent Labs  Lab 03/16/21 0920 03/17/21 0442 03/20/21 0439  WBC 5.1 5.8 11.7*  NEUTROABS 4.1  --   --   HGB 8.1* 8.3* 8.1*  HCT 26.7* 25.5* 26.0*  MCV 112.7* 101.2* 101.2*  PLT 278 244 865    Basic Metabolic Panel: Recent Labs  Lab 03/16/21 0154 03/17/21 0442 03/18/21 0138 03/19/21 0244 03/20/21 0439 03/21/21 0406  NA 138 141 141 141 142 139  K 3.6 3.7 3.5 4.3 3.7 3.8  CL 107 109 112* 109 108 105  CO2 26 25 23 26 26 27   GLUCOSE 158* 144* 132* 125* 144* 245*  BUN 23 24* 25* 25* 28* 33*  CREATININE 1.04 1.15 1.11  1.17 1.14 1.07  CALCIUM 7.9* 7.8* 7.7* 7.9* 7.8* 7.7*  MG 2.1 2.1  --  2.2 2.2 2.3  PHOS 3.6 3.1  --  3.2  --  3.3    GFR: Estimated Creatinine Clearance: 78.2 mL/min (by C-G formula based on SCr of 1.07 mg/dL). Liver Function Tests: Recent Labs  Lab 03/17/21 0442 03/21/21 0406  AST 30 27  ALT 30 31  ALKPHOS 90 75  BILITOT 0.2* 0.5  PROT 5.1* 5.6*  ALBUMIN 1.5* <1.5*    No results for input(s): LIPASE, AMYLASE in the last 168 hours. No results for input(s): AMMONIA in the last 168 hours. Coagulation Profile: No results for input(s): INR, PROTIME in the last 168 hours. Cardiac Enzymes: No results for input(s): CKTOTAL, CKMB, CKMBINDEX, TROPONINI in the last 168 hours. BNP (last 3 results) No results for input(s): PROBNP in the last 8760 hours. HbA1C: No results for input(s): HGBA1C in the last 72 hours. CBG: Recent Labs  Lab 03/21/21 1519 03/21/21 1950 03/21/21 2300 03/22/21 0746 03/22/21 1113  GLUCAP 184* 184* 169* 173* 227*    Lipid Profile: Recent Labs    03/21/21 0406  TRIG 186*    Thyroid Function Tests: No results for input(s): TSH, T4TOTAL, FREET4, T3FREE, THYROIDAB in the last 72 hours. Anemia Panel: No results for input(s): VITAMINB12, FOLATE, FERRITIN, TIBC, IRON, RETICCTPCT in the last 72 hours.   Radiology Studies: I have reviewed all of the imaging during this hospital visit personally  Scheduled Meds:  Chlorhexidine Gluconate Cloth  6 each Topical Daily   enoxaparin (LOVENOX) injection  100 mg Subcutaneous Q12H   furosemide  40 mg Intravenous Daily   insulin aspart  0-15 Units Subcutaneous Q4H   lidocaine  1 patch Transdermal Q24H   lidocaine  1 patch Transdermal Q24H   methylPREDNISolone (SOLU-MEDROL) injection  40 mg Intravenous Q12H   metoCLOPramide (REGLAN) injection  10 mg Intravenous Q6H   nicotine  21 mg Transdermal Daily   pantoprazole (PROTONIX) IV  40 mg Intravenous Q12H   sodium chloride flush  10 mL Intracatheter Q12H    Continuous Infusions:  sodium chloride 10 mL/hr at 03/20/21 0311   diltiazem (CARDIZEM) infusion 5 mg/hr (03/21/21 1514)   methocarbamol (ROBAXIN) IV 1,000 mg (03/22/21 0547)   piperacillin-tazobactam (ZOSYN)  IV 3.375 g (03/22/21 0805)   TPN ADULT (ION) 85 mL/hr at 03/21/21 1727   TPN ADULT (ION)       LOS: 30 days   Total time spent in minutes: [place], MD [facility] Hospitalist

## 2021-03-22 NOTE — Progress Notes (Signed)
PHARMACY - TOTAL PARENTERAL NUTRITION CONSULT NOTE  Indication: Prolonged ileus  Patient Measurements: Height: 6' (182.9 cm) Weight: 98.9 kg (218 lb 0.6 oz) IBW/kg (Calculated) : 77.6 TPN AdjBW (KG): 82.6 Body mass index is 29.57 kg/m.  Assessment:  77 YOM with lower abdominal pain/N/V found to have perforated diverticulitis. CT abd on 9/3 found to have active ileus/pSBO from diverticulitis/bowel inflammation. Pharmacy consulted to manage TPN for prolonged ileus. Of note patient has a history pf alcohol dependency and is at high risk for refeeding.   Glucose / Insulin: no hx DM, A1c 5.9% - CBGs 160-245/24hr. Used 15 units SSI in past 24 hrs, 15 units insulin in TPN. Patient started on methylprednisone 40mg  q12hr 10/3 leading to increased CBGs acutely. CTM Electrolytes: Na 139 (stable), K 3.8 (goal >/= 4 with ileus), Cl down to 105. Other electrolytes wnl.  Renal: SCr trending down to 1.07, BUN 33 Hepatic: LFTs / tbili  WNL, TG 86>>186 (CTM), albumin <1.5 Intake / Output; MIVF: NG 1400 ml/24hr, clamping trials ongoing, drain removed, none from colostomy, UOP 0.105ml/kg/hr, lasix increased to 40mg  IV daily  GI Meds: IV Reglan/6hr, PPI IV/24h ID: Zosyn through 23/53/61 for complicated diverticulitis with IA abscess GI Imaging:  - 9/3 CT abd: Active ileus/PSBO - 9/16 CT abd: L-iliopsoas fluid collection (abscess or hematoma), resolution of prior diverticular abscess, still w/ evidence of ileus - 9/27 CT abd: continued retroperitoneal complex fluid collection, SBO GI Surgeries / Procedures:  - 9/10 ex-lap, sigmoid colectomy w/ end colostomy creation, enterorrhaphy, drainage of intra-abd abscess, wound vac placement  Central access: PICC line 02/24/21 TPN start date: 02/25/21  Nutritional Goals, RD Estimated Needs Total Energy Estimated Needs: 2000-2200 Total Protein Estimated Needs: 100-110 grams Total Fluid Estimated Needs: >2L  Current Nutrition:  TPN CLD started 9/23 > NPO on 9/27 with  NG to LIWS   Plan:  Continue TPN at goal rate 85 ml/hr to provide 102g AA, 367g CHO and 45g ILE for a total of 2104 kCal, meeting 100% of patient's needs Electrolytes in TPN: Continue Na 150 mEq/L - monitoring closely for need to reduce, K 45 mEq/L, Ca 2.91mEq/L, Mg 81mEq/L, Phos 17mmol/L. Cl:Ac 1:2 (no changes 10/4) Monitor K closely while diuresis is ongoing. May need to reduce in bag if no longer diuresing TG trending up 86>>186. CTM closely. Ordered TG for Thursday Add standard MVI, trace elements, folate and thiamine to TPN Continue moderate SSI Q4H and 15 units regular insulin in TPN                  -CBGs elevated secondary to steroids. No changes to TPN Standard TPN labs on Mon and Thurs  Thank you for allowing pharmacy to be a part of this patient's care.  Donnald Garre, PharmD Clinical Pharmacist  Please check AMION for all [place] numbers After 10:00 PM, call [place] [phone]

## 2021-03-22 NOTE — Progress Notes (Signed)
Robaxin given at 0547 did not run. Verbal order from Dr. Doristine Bosworth to run after Lajean Silvius is finished.

## 2021-03-22 NOTE — Plan of Care (Signed)
  Problem: Health Behavior/Discharge Planning: Goal: Ability to manage health-related needs will improve Outcome: Progressing   Problem: Clinical Measurements: Goal: Ability to maintain clinical measurements within normal limits will improve Outcome: Progressing Goal: Will remain free from infection Outcome: Progressing Goal: Diagnostic test results will improve Outcome: Progressing Goal: Respiratory complications will improve Outcome: Progressing Goal: Cardiovascular complication will be avoided Outcome: Progressing   Problem: Activity: Goal: Risk for activity intolerance will decrease Outcome: Progressing   Problem: Nutrition: Goal: Adequate nutrition will be maintained Outcome: Progressing   Problem: Coping: Goal: Level of anxiety will decrease Outcome: Progressing   Problem: Elimination: Goal: Will not experience complications related to bowel motility Outcome: Progressing Goal: Will not experience complications related to urinary retention Outcome: Progressing   Problem: Pain Managment: Goal: General experience of comfort will improve Outcome: Progressing   Problem: Safety: Goal: Ability to remain free from injury will improve Outcome: Progressing   Problem: Skin Integrity: Goal: Risk for impaired skin integrity will decrease Outcome: Progressing   Problem: Education: Goal: Knowledge of disease or condition will improve Outcome: Progressing Goal: Understanding of medication regimen will improve Outcome: Progressing Goal: Individualized Educational Video(s) Outcome: Progressing   Problem: Activity: Goal: Ability to tolerate increased activity will improve Outcome: Progressing   Problem: Cardiac: Goal: Ability to achieve and maintain adequate cardiopulmonary perfusion will improve Outcome: Progressing   Problem: Health Behavior/Discharge Planning: Goal: Ability to safely manage health-related needs after discharge will improve Outcome: Progressing    Problem: Education: Goal: Required Educational Video(s) Outcome: Progressing   Problem: Clinical Measurements: Goal: Ability to maintain clinical measurements within normal limits will improve Outcome: Progressing Goal: Postoperative complications will be avoided or minimized Outcome: Progressing   Problem: Skin Integrity: Goal: Demonstration of wound healing without infection will improve Outcome: Progressing

## 2021-03-22 NOTE — Progress Notes (Signed)
Physical Therapy Treatment Patient Details Name: Travis Conrad MRN: [id number] DOB: 1950-08-19 Today's Date: 03/22/2021   History of Present Illness 70 yo admitted 9/3 with abdominal pain, vomiting and ileus. 9/4 Afib with RVR. 9/8 intrabdominal abscess s/p IR drain placed. 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 Pt found to have lt iliopsoas hematoma on CT 9/16.  9/20 left knee aspiration. 9/26 NG tube removed. 9/27 NGT replaced due to vomiting. 10/1 atelectasis. PMhx: HTN, HLD, Afib, ETOH use    PT Comments    Pt with increased edema bil LE, continued wheezing respirations with SpO2 90-94% on RA with activity. Pt educated for IS use with pt struggling to perform 500cc with proper use. Pt with progressive decline in function over the last week with increasing struggle with edema, strength, mobility and cognition as well as cardiopulmonary function. At this time pt cannot return directly home with spouse and will require post acute rehab. Wife and pt agree. SCDs and elevated legs in chair.  HR 110 BP 143/78 (103) sitting EOB    Recommendations for follow up therapy are one component of a multi-disciplinary discharge planning process, led by the attending physician.  Recommendations may be updated based on patient status, additional functional criteria and insurance authorization.  Follow Up Recommendations  Supervision/Assistance - 24 hour;CIR     Equipment Recommendations  3in1 (PT)    Recommendations for Other Services       Precautions / Restrictions Precautions Precautions: Fall;Other (comment) Precaution Comments: wound vAC,  NGT     Mobility  Bed Mobility Overal bed mobility: Needs Assistance Bed Mobility: Rolling;Sidelying to Sit Rolling: Min assist Sidelying to sit: Min assist       General bed mobility comments: pt with cues for sequence to roll to left with assist of pad to rotate pelvis and physical assist  to lift trunk from surface. Increased time to stabilize in sitting with cues for scooting to EOB with pt limited by dizziness with BP 143/78    Transfers Overall transfer level: Needs assistance   Transfers: Sit to/from Stand Sit to Stand: Min assist         General transfer comment: min assist to stand from bed and recliner x 3 trials with cues for sequence and assist for anterior translation and rise  Ambulation/Gait Ambulation/Gait assistance: Min assist;+2 safety/equipment Gait Distance (Feet): 35 Feet Assistive device: Rolling walker (2 wheeled) Gait Pattern/deviations: Trunk flexed;Step-through pattern;Decreased stride length;Shuffle   Gait velocity interpretation: <1.8 ft/sec, indicate of risk for recurrent falls General Gait Details: pt with decreased step length and foot clearance. cues for posture, proximity to RW and safety with chair follow. Pt walked 3', 15', 88' with chair pulled to him and significantly limited by fatigue and weakness   Stairs             Wheelchair Mobility    Modified Rankin (Stroke Patients Only)       Balance Overall balance assessment: Needs assistance Sitting-balance support: Feet supported;No upper extremity supported Sitting balance-Leahy Scale: Good Sitting balance - Comments: EOB without UE support   Standing balance support: Bilateral upper extremity supported;During functional activity Standing balance-Leahy Scale: Poor Standing balance comment: reliant on RW in standing                            Cognition Arousal/Alertness: Awake/alert Behavior During Therapy: Anxious Overall Cognitive Status: Impaired/Different from baseline Area of Impairment:  Attention;Memory;Following commands;Safety/judgement                   Current Attention Level: Sustained Memory: Decreased short-term memory Following Commands: Follows one step commands consistently Safety/Judgement: Decreased awareness of deficits      General Comments: pt oriented but continues to need redirection to task and recall of who care providers are      Exercises General Exercises - Lower Extremity Long Arc Quad: AROM;Both;Seated;20 reps Hip Flexion/Marching: Both;AROM;20 reps;Seated Toe Raises: AROM;20 reps;Both;Seated Heel Raises: AROM;20 reps;Seated;Both    General Comments        Pertinent Vitals/Pain Pain Score: 7  Pain Location: abdomen Pain Descriptors / Indicators: Aching;Guarding Pain Intervention(s): Limited activity within patient's tolerance;Repositioned;Monitored during session;Premedicated before session    Home Living                      Prior Function            PT Goals (current goals can now be found in the care plan section) Progress towards PT goals: Not progressing toward goals - comment    Frequency    Min 3X/week      PT Plan Discharge plan needs to be updated    Co-evaluation              AM-PAC PT "6 Clicks" Mobility   Outcome Measure  Help needed turning from your back to your side while in a flat bed without using bedrails?: A Little Help needed moving from lying on your back to sitting on the side of a flat bed without using bedrails?: A Little Help needed moving to and from a bed to a chair (including a wheelchair)?: A Little Help needed standing up from a chair using your arms (e.g., wheelchair or bedside chair)?: A Little Help needed to walk in hospital room?: A Lot Help needed climbing 3-5 steps with a railing? : Total 6 Click Score: 15    End of Session Equipment Utilized During Treatment: Gait belt Activity Tolerance: Patient limited by fatigue Patient left: in chair;with call bell/phone within reach;with family/visitor present;with chair alarm set Nurse Communication: Mobility status PT Visit Diagnosis: Other abnormalities of gait and mobility (R26.89);Difficulty in walking, not elsewhere classified (R26.2)     Time: 0822-0922 PT Time  Calculation (min) (ACUTE ONLY): 60 min  Charges:  $Gait Training: 23-37 mins $Therapeutic Exercise: 8-22 mins $Therapeutic Activity: 8-22 mins                     Pattijo Juste P, PT Acute Rehabilitation Services Pager: [phone] Office: [place] 03/22/2021, 9:34 AM

## 2021-03-22 NOTE — Progress Notes (Signed)
Progress Note  24 Days Post-Op  Subjective: Patient is sitting up in chair this AM. He is still having thick mucus like stool from stoma. He feels bloated intermittently with NGT clamped. It appears he had around 550 out during suction phase overnight. He reports late evening daily he starts to have a lot of cramping/pain and anxiety prior to being put back on suction. His wife was at the bedside this AM.   Objective: Vital signs in last 24 hours: Temp:  [97.7 F (36.5 C)-98.3 F (36.8 C)] 98 F (36.7 C) (10/04 0800) Pulse Rate:  [84-110] 110 (10/04 0929) Resp:  [18-23] 18 (10/04 0800) BP: (107-137)/(63-72) 137/72 (10/04 0800) SpO2:  [92 %-100 %] 94 % (10/04 0929) Last BM Date: 03/20/21  Intake/Output from previous day: 10/03 0701 - 10/04 0700 In: 2004.6 [I.V.:1490.1; IV Piggyback:514.4] Out: 2300 [Urine:2050; Emesis/NG output:250] Intake/Output this shift: Total I/O In: -  Out: 1050 [Urine:750; Emesis/NG output:300]  PE: Gen:  Alert, NAD, pleasant Pulm:  Normal rate and effort. Wheezing bilaterally  Abd: Soft, distended, diffuse mild ttp without peritonitis. Stoma appears viable. Dark thick stool present in ostomy appliance. midline wound with VAC present   Lab Results:  Recent Labs    03/20/21 0439  WBC 11.7*  HGB 8.1*  HCT 26.0*  PLT 198   BMET Recent Labs    03/20/21 0439 03/21/21 0406  NA 142 139  K 3.7 3.8  CL 108 105  CO2 26 27  GLUCOSE 144* 245*  BUN 28* 33*  CREATININE 1.14 1.07  CALCIUM 7.8* 7.7*   PT/INR No results for input(s): LABPROT, INR in the last 72 hours. CMP     Component Value Date/Time   NA 139 03/21/2021 0406   NA 134 09/30/2019 0959   NA 142 03/03/2016 0921   K 3.8 03/21/2021 0406   K 4.3 03/03/2016 0921   CL 105 03/21/2021 0406   CO2 27 03/21/2021 0406   CO2 25 03/03/2016 0921   GLUCOSE 245 (H) 03/21/2021 0406   GLUCOSE 89 03/03/2016 0921   BUN 33 (H) 03/21/2021 0406   BUN 11 09/30/2019 0959   BUN 13.6 03/03/2016  0921   CREATININE 1.07 03/21/2021 0406   CREATININE 1.2 03/03/2016 0921   CALCIUM 7.7 (L) 03/21/2021 0406   CALCIUM 9.6 03/03/2016 0921   PROT 5.6 (L) 03/21/2021 0406   PROT 7.3 03/03/2016 0921   ALBUMIN <1.5 (L) 03/21/2021 0406   ALBUMIN 3.8 03/03/2016 0921   AST 27 03/21/2021 0406   AST 36 (H) 03/03/2016 0921   ALT 31 03/21/2021 0406   ALT 31 03/03/2016 0921   ALKPHOS 75 03/21/2021 0406   ALKPHOS 116 03/03/2016 0921   BILITOT 0.5 03/21/2021 0406   BILITOT 0.71 03/03/2016 0921   GFRNONAA >60 03/21/2021 0406   GFRNONAA 67 07/25/2013 1712   GFRAA >60 10/03/2019 0930   GFRAA 77 07/25/2013 1712   Lipase     Component Value Date/Time   LIPASE 22 02/19/2021 1653       Studies/Results: DG CHEST PORT 1 VIEW  Result Date: 03/20/2021 CLINICAL DATA:  Hypoxia. EXAM: PORTABLE CHEST 1 VIEW COMPARISON:  Chest radiograph dated 03/19/2021. FINDINGS: The heart size is normal. Vascular calcifications are seen in the aortic arch. A right upper extremity peripherally inserted central venous catheter tip overlies the superior vena cava. An enteric tube terminates in the stomach. Mild bilateral lower lung predominant interstitial opacities and atelectasis may be increased since prior exam. No pleural effusion or pneumothorax.  Degenerative changes are seen in the spine. IMPRESSION: Mild bilateral lower lung predominant interstitial opacities and atelectasis may be increased. Aortic Atherosclerosis (ICD10-I70.0). Electronically Signed   By: Zerita Boers M.D.   On: 03/20/2021 18:11    Anti-infectives: Anti-infectives (From admission, onward)    Start     Dose/Rate Route Frequency Ordered Stop   03/09/21 0800  piperacillin-tazobactam (ZOSYN) IVPB 3.375 g        3.375 g 12.5 mL/hr over 240 Minutes Intravenous Every 8 hours 03/09/21 0706 04/04/21 0759   02/26/21 0100  doxycycline (VIBRAMYCIN) 100 mg in sodium chloride 0.9 % 250 mL IVPB        100 mg 125 mL/hr over 120 Minutes Intravenous 2 times  daily 02/26/21 0005 03/08/21 2359   02/20/21 0300  piperacillin-tazobactam (ZOSYN) IVPB 3.375 g        3.375 g 12.5 mL/hr over 240 Minutes Intravenous Every 8 hours 02/19/21 1907 03/08/21 2359   02/19/21 1900  ceFEPIme (MAXIPIME) 2 g in sodium chloride 0.9 % 100 mL IVPB  Status:  Discontinued        2 g 200 mL/hr over 30 Minutes Intravenous  Once 02/19/21 1857 02/19/21 1906   02/19/21 1900  metroNIDAZOLE (FLAGYL) IVPB 500 mg  Status:  Discontinued        500 mg 100 mL/hr over 60 Minutes Intravenous  Once 02/19/21 1857 02/19/21 1906   02/19/21 1815  piperacillin-tazobactam (ZOSYN) IVPB 3.375 g        3.375 g 100 mL/hr over 30 Minutes Intravenous  Once 02/19/21 1809 02/19/21 1944        Assessment/Plan POD 24 s/p ex lap, sigmoid colectomy, end colostomy with enterorrhaphy and drainage of intraabdominal abscess - Dr. Kieth Brightly and Dr. Radene Knee 9/10 for Acute sigmoid diverticulitis with contained perforation and abscess - CT 9/16 demonstrates heterogenous left iliopsoas collection likely hematoma, stranding in the retroperitoneum and subcu tissue.  Distal colon appears decompressed and very small caliber but there is contrast entering this area; there is additionally a similarly small-caliber segment of colon in the proximal transverse and the ascending colon is not dilated- suspect over all this represents small bowel/gastric ileus.  IR does not recommend draining the hematoma at this point. ID following and recommending Zosyn through 10/17  - NGT replaced 9/27  - CT 9/27 with continued complex fluid collection along left psoas, SBO with transition in mid jejunum, post-surgical changes of colon resection with colostomy - Was started on reglan last weekend. Discussed with him the risk of tardive dyskinesia/EPS on 9/20. Patient stated understanding and is okay with continuing Reglan.  - Gastrograffin study 9/28 showed Stable gaseous distention of the small bowel, oral contrast progressed into the  colon - continue clamping trials with cyclic clamping - put back to suction at 1900 today and leave on suction until 0700 tomorrow AM and record drainage amount in that time frame; if patient becomes nauseated during clamping phase then place back on LIWS - considering repeat CT but will discuss with MD this AM - pt may have ice chips and sips of clears with NGT clamped - WOCN following for new ostomy teaching and vac changes - Cont TPN - Encouraged ambulation (working with therapies), use IS, multimodal pain control  - Cont abx per ID recs   FEN - ok to have sips of clears and ice chips with NGT clamped, NGT clamping, TPN VTE - SCDs, therapeutic lovenox  ID - Zosyn 9/4 - 10/17 (per ID) Foley - out, voiding.  Per primary: A fib - cards following  AKI - resolved ABL anemia -  stable HTN ETOH use L knee pain - per ortho  LOS: 30 days    Norm Parcel, [facility] Surgery 03/22/2021, 9:59 AM Please see Amion for pager number during day hours 7:00am-4:30pm

## 2021-03-22 NOTE — Progress Notes (Signed)
[facility] for [facility]  Date of Admission:  02/19/2021      Total days of antibiotics: 30   Zosyn Infusion 9/04 >>        ASSESSMENT: Travis Conrad is a 70 y.o. male with complicated incompletely drained diverticular abscess, s/p IR drain 9/08 >> , ex-lap with sigmoid colectomy and colostomy 4/58 complicated by illness, malnutrition. He seems improved compared to a few weeks ago. Tolerating antibiotics well. Would agree with repeating CT scan to follow with progress for abscess and ongoing SBO symptoms. Will continue zosyn as planned through 10/17 pending CT scan findings.  Unable to use PO with ongoing intolerance of POs a/w SBO.   Lower extremity edema - has pitting edema through thighs - wife and he are concerned about this. Management per primary.   Medication monitoring - tolerating antibiotics with out any determined side effects.     PLAN: Continue zosyn Please check contrasted CT scan tomorrow (would probably tolerate contrast better with empty stomach in AM)    Principal Problem:   Diverticulitis of large intestine with perforation and abscess without bleeding Active Problems:   Diverticulitis of intestine with abscess   Atrial fibrillation, chronic (HCC)   Essential hypertension   Diverticulitis   AKI (acute kidney injury) ([place])   Severe sepsis with acute organ dysfunction (HCC)   Intra-abdominal abscess (HCC)   Epididymitis    Chlorhexidine Gluconate Cloth  6 each Topical Daily   enoxaparin (LOVENOX) injection  100 mg Subcutaneous Q12H   furosemide  40 mg Intravenous Daily   insulin aspart  0-15 Units Subcutaneous Q4H   lidocaine  1 patch Transdermal Q24H   lidocaine  1 patch Transdermal Q24H   methylPREDNISolone (SOLU-MEDROL) injection  40 mg Intravenous Q12H   metoCLOPramide (REGLAN) injection  10 mg Intravenous Q6H   nicotine  21 mg Transdermal Daily   pantoprazole (PROTONIX) IV  40 mg Intravenous Q12H   sodium chloride flush   10 mL Intracatheter Q12H    SUBJECTIVE: Travis Conrad is accompanied by his wife. He is feeling better compared to 2 weeks ago. Still not tolerating diet. He continues on TPN and followed with Surgery team. Cyclic NGT clamping with 550 cc out overnight; seems he is not tolerating prolonged clamping of tube as he becomes distended with abdominal pain/cramping by the end of the day/evening.  No fevers/chills. They have been more concerned with respiratory events over the weekend - described heaviness and difficulty getting breath. He has a lot of fluid on his legs that make it hard to get around.   Last seen by Dr. Gale Journey on 9/21 - Recommended to continue continuous zosyn infusion through 10/17. Repeat CT scan on 9/27 - continued retroperitoneal complex fluid collection along anterior margin of Lt psoas muscle; small SBO, post-surgical changes from partial colon resection and LLQ colostomy.  NGT was replaced after this scan given SBO. Trial of Reglan ongoing. Gastrograffin study 9.28 with stable gaseous distention of small bowel with contrast progressed into colon.    Review of Systems: Review of Systems  Constitutional:  Negative for chills and fever.  HENT:  Negative for tinnitus.   Eyes:  Negative for blurred vision and photophobia.  Respiratory:  Positive for shortness of breath. Negative for cough and sputum production.   Cardiovascular:  Negative for chest pain.  Gastrointestinal:  Positive for abdominal pain. Negative for diarrhea, nausea and vomiting.  Genitourinary:  Negative for dysuria.  Musculoskeletal:  Positive for joint pain (Lt knee).  Skin:  Negative for rash.  Neurological:  Negative for headaches.    Allergies  Allergen Reactions   Atorvastatin Other (See Comments)    myalgias    OBJECTIVE: Vitals:   03/22/21 0757 03/22/21 0800 03/22/21 0929 03/22/21 1115  BP:  137/72  136/67  Pulse: 91 86 (!) 110 90  Resp: 18 18  (!) 24  Temp:  98 F (36.7 C)  98.1 F (36.7 C)   TempSrc:  Oral  Oral  SpO2: 94% 100% 94% 94%  Weight:      Height:       Body mass index is 29.57 kg/m.  Physical Exam Vitals reviewed.  Constitutional:      Appearance: He is well-developed.     Comments: Seated comfortably on side of bed.   HENT:     Mouth/Throat:     Dentition: Normal dentition. No dental abscesses.  Cardiovascular:     Rate and Rhythm: Normal rate and regular rhythm.     Heart sounds: Normal heart sounds.  Pulmonary:     Effort: Pulmonary effort is normal.     Breath sounds: Normal breath sounds.     Comments: Breathing comfortably on room air Abdominal:     General: Bowel sounds are decreased. There is no distension.     Palpations: Abdomen is soft.     Tenderness: There is no abdominal tenderness.     Comments: Ostomy site intact  Lymphadenopathy:     Cervical: No cervical adenopathy.  Skin:    General: Skin is warm and dry.     Findings: No rash.  Neurological:     Mental Status: He is alert and oriented to person, place, and time.  Psychiatric:        Judgment: Judgment normal.    Lab Results Lab Results  Component Value Date   WBC 11.7 (H) 03/20/2021   HGB 8.1 (L) 03/20/2021   HCT 26.0 (L) 03/20/2021   MCV 101.2 (H) 03/20/2021   PLT 198 03/20/2021    Lab Results  Component Value Date   CREATININE 1.07 03/21/2021   BUN 33 (H) 03/21/2021   NA 139 03/21/2021   K 3.8 03/21/2021   CL 105 03/21/2021   CO2 27 03/21/2021    Lab Results  Component Value Date   ALT 31 03/21/2021   AST 27 03/21/2021   ALKPHOS 75 03/21/2021   BILITOT 0.5 03/21/2021     Microbiology: No results found for this or any previous visit (from the past 240 hour(s)).   Janene Madeira, MSN, NP-C [facility] [email or web address] Pager: [phone] Office: [phone] [place]: [phone]

## 2021-03-23 DIAGNOSIS — K5720 Diverticulitis of large intestine with perforation and abscess without bleeding: Secondary | ICD-10-CM | POA: Diagnosis not present

## 2021-03-23 LAB — GLUCOSE, CAPILLARY
Glucose-Capillary: 179 mg/dL — ABNORMAL HIGH (ref 70–99)
Glucose-Capillary: 184 mg/dL — ABNORMAL HIGH (ref 70–99)
Glucose-Capillary: 193 mg/dL — ABNORMAL HIGH (ref 70–99)
Glucose-Capillary: 174 mg/dL — ABNORMAL HIGH (ref 70–99)
Glucose-Capillary: 186 mg/dL — ABNORMAL HIGH (ref 70–99)
Glucose-Capillary: 179 mg/dL — ABNORMAL HIGH (ref 70–99)

## 2021-03-23 LAB — BASIC METABOLIC PANEL
Anion gap: 8 (ref 5–15)
BUN: 42 mg/dL — ABNORMAL HIGH (ref 8–23)
CO2: 28 mmol/L (ref 22–32)
Calcium: 7.8 mg/dL — ABNORMAL LOW (ref 8.9–10.3)
Chloride: 99 mmol/L (ref 98–111)
Creatinine, Ser: 1.01 mg/dL (ref 0.61–1.24)
GFR, Estimated: >60 mL/min (ref >60)
Glucose, Bld: 203 mg/dL — ABNORMAL HIGH (ref 70–99)
Potassium: 3.7 mmol/L (ref 3.5–5.1)
Sodium: 135 mmol/L (ref 135–145)

## 2021-03-23 LAB — CBC
HCT: 23.3 % — ABNORMAL LOW (ref 39.0–52.0)
Hemoglobin: 7.4 g/dL — ABNORMAL LOW (ref 13.0–17.0)
MCH: 31 pg (ref 26.0–34.0)
MCHC: 31.8 g/dL (ref 30.0–36.0)
MCV: 97.5 fL (ref 80.0–100.0)
Platelets: 281 10*3/uL (ref 150–400)
RBC: 2.39 MIL/uL — ABNORMAL LOW (ref 4.22–5.81)
RDW: 14.7 % (ref 11.5–15.5)
WBC: 9.2 10*3/uL (ref 4.0–10.5)
nRBC: 0.2 % (ref 0.0–0.2)

## 2021-03-23 MED ORDER — TRAVASOL 10 % IV SOLN
INTRAVENOUS | Status: AC
  Filled 2021-03-23: qty 1020

## 2021-03-23 NOTE — Progress Notes (Signed)
Ng tube is to be clamped until 9 pm

## 2021-03-23 NOTE — Progress Notes (Signed)
Gave pt education about clamping trials. Pt asked for ice water. Pt educated on not taking sips while NG tube is hooked to suction. Pt stated that he has been drinking water and will continue to drink water. This nurse educated him on importance w/ his plan of care and to wait until 0700 when his tube will be clamped. Pt still refused to comply. Pt not given ice cold water and stated that he will drink warm water. Will reassess.

## 2021-03-23 NOTE — Progress Notes (Signed)
Progress Note  25 Days Post-Op  Subjective: Pt reports improved pain control yesterday. Otherwise stable. Discussed plan with wife at bedside  Objective: Vital signs in last 24 hours: Temp:  [97.3 F (36.3 C)-98.1 F (36.7 C)] 97.3 F (36.3 C) (10/05 0745) Pulse Rate:  [72-112] 72 (10/05 0745) Resp:  [18-24] 19 (10/05 0745) BP: (118-141)/(54-76) 118/56 (10/05 0745) SpO2:  [93 %-95 %] 93 % (10/05 0745) Last BM Date: 03/20/21  Intake/Output from previous day: 10/04 0701 - 10/05 0700 In: 346.4 [IV Piggyback:346.4] Out: 3020 [Urine:2250; Emesis/NG output:750; Stool:20] Intake/Output this shift: No intake/output data recorded.  PE: Gen:  Alert, NAD, pleasant Pulm:  Normal rate and effort. Wheezing bilaterally  Abd: Soft, distended, diffuse mild ttp without peritonitis. Stoma appears viable. Dark thick stool present in ostomy appliance. midline wound clean and well healing, inferior portion of wound still with enough depth to continue VAC for a little longer    Lab Results:  Recent Labs    03/23/21 0437  WBC 9.2  HGB 7.4*  HCT 23.3*  PLT 281   BMET Recent Labs    03/21/21 0406 03/23/21 0437  NA 139 135  K 3.8 3.7  CL 105 99  CO2 27 28  GLUCOSE 245* 203*  BUN 33* 42*  CREATININE 1.07 1.01  CALCIUM 7.7* 7.8*   PT/INR No results for input(s): LABPROT, INR in the last 72 hours. CMP     Component Value Date/Time   NA 135 03/23/2021 0437   NA 134 09/30/2019 0959   NA 142 03/03/2016 0921   K 3.7 03/23/2021 0437   K 4.3 03/03/2016 0921   CL 99 03/23/2021 0437   CO2 28 03/23/2021 0437   CO2 25 03/03/2016 0921   GLUCOSE 203 (H) 03/23/2021 0437   GLUCOSE 89 03/03/2016 0921   BUN 42 (H) 03/23/2021 0437   BUN 11 09/30/2019 0959   BUN 13.6 03/03/2016 0921   CREATININE 1.01 03/23/2021 0437   CREATININE 1.2 03/03/2016 0921   CALCIUM 7.8 (L) 03/23/2021 0437   CALCIUM 9.6 03/03/2016 0921   PROT 5.6 (L) 03/21/2021 0406   PROT 7.3 03/03/2016 0921   ALBUMIN <1.5  (L) 03/21/2021 0406   ALBUMIN 3.8 03/03/2016 0921   AST 27 03/21/2021 0406   AST 36 (H) 03/03/2016 0921   ALT 31 03/21/2021 0406   ALT 31 03/03/2016 0921   ALKPHOS 75 03/21/2021 0406   ALKPHOS 116 03/03/2016 0921   BILITOT 0.5 03/21/2021 0406   BILITOT 0.71 03/03/2016 0921   GFRNONAA >60 03/23/2021 0437   GFRNONAA 67 07/25/2013 1712   GFRAA >60 10/03/2019 0930   GFRAA 77 07/25/2013 1712   Lipase     Component Value Date/Time   LIPASE 22 02/19/2021 1653       Studies/Results: CT ABDOMEN PELVIS W CONTRAST  Result Date: 03/22/2021 CLINICAL DATA:  Abdominal infection suspected. EXAM: CT ABDOMEN AND PELVIS WITH CONTRAST TECHNIQUE: Multidetector CT imaging of the abdomen and pelvis was performed using the standard protocol following bolus administration of intravenous contrast. CONTRAST:  178mL OMNIPAQUE IOHEXOL 350 MG/ML SOLN COMPARISON:  CT abdomen and pelvis 03/04/2021. FINDINGS: Lower chest: Moderate right and small left pleural effusions are present, increased from prior. There is compressive atelectasis in the bilateral lower lobes. Hepatobiliary: No focal liver abnormality is seen. No gallstones, gallbladder wall thickening, or biliary dilatation. Pancreas: Unremarkable. No pancreatic ductal dilatation or surrounding inflammatory changes. Spleen: Normal in size without focal abnormality. Adrenals/Urinary Tract: Subcentimeter left adrenal nodule is unchanged.  Right adrenal gland within normal limits. There is mild nonspecific bilateral perinephric fat stranding which appears similar to the prior examination. There is no hydronephrosis. Bladder is within normal limits. Stomach/Bowel: Tip of nasogastric tube is in the stomach. Air contrast level is noted in the stomach. There is some distended small bowel loops proximally measuring up to 4 cm. These have decreased in size when compared to the prior examination. Ileal loops are decompressed. No definite transition point visualized. Colon is  nondilated. There is colonic diverticulosis without evidence for diverticulitis. Left lower quadrant colostomy appears within normal limits. The appendix is within normal limits. There is also some contrast within rectal stump without extravasation. Vascular/Lymphatic: Aortic atherosclerosis. No enlarged abdominal or pelvic lymph nodes. Reproductive: Prostate gland is enlarged, unchanged from prior examination. Other: Left ileus psoas heterogeneity and enlargement is again seen. Size is unchanged, but there is no longer fluid fluid level identified. Underlying lesion would be could difficult to exclude. There is no ascites or free air. No pelvic fluid collection identified. Percutaneous drainage catheter has been removed. There is no focal abdominal wall hernia. Diffuse body wall edema is unchanged. Musculoskeletal: Lipoma in the anterior right thigh musculature is unchanged. Degenerative changes affect the spine. IMPRESSION: 1. Left iliopsoas enlargement heterogeneity persists, with fluid fluid level is no longer identified. Findings may represent intramuscular hematoma, infection or mass. 2. Prominent proximal small bowel without definite transition point favored as ileus. Degree of small-bowel distention has decreased. Partial small bowel obstruction cannot be entirely excluded. 3. No pelvic fluid collection. Percutaneous drainage catheter has been removed. 4. Left lower quadrant colostomy appears uncomplicated. 5. Bilateral pleural effusions have increased. 6. Stable body wall edema. 7.  Aortic Atherosclerosis (ICD10-I70.0). Electronically Signed   By: Ronney Asters M.D.   On: 03/22/2021 20:13    Anti-infectives: Anti-infectives (From admission, onward)    Start     Dose/Rate Route Frequency Ordered Stop   03/09/21 0800  piperacillin-tazobactam (ZOSYN) IVPB 3.375 g        3.375 g 12.5 mL/hr over 240 Minutes Intravenous Every 8 hours 03/09/21 0706 04/04/21 0759   02/26/21 0100  doxycycline (VIBRAMYCIN)  100 mg in sodium chloride 0.9 % 250 mL IVPB        100 mg 125 mL/hr over 120 Minutes Intravenous 2 times daily 02/26/21 0005 03/08/21 2359   02/20/21 0300  piperacillin-tazobactam (ZOSYN) IVPB 3.375 g        3.375 g 12.5 mL/hr over 240 Minutes Intravenous Every 8 hours 02/19/21 1907 03/08/21 2359   02/19/21 1900  ceFEPIme (MAXIPIME) 2 g in sodium chloride 0.9 % 100 mL IVPB  Status:  Discontinued        2 g 200 mL/hr over 30 Minutes Intravenous  Once 02/19/21 1857 02/19/21 1906   02/19/21 1900  metroNIDAZOLE (FLAGYL) IVPB 500 mg  Status:  Discontinued        500 mg 100 mL/hr over 60 Minutes Intravenous  Once 02/19/21 1857 02/19/21 1906   02/19/21 1815  piperacillin-tazobactam (ZOSYN) IVPB 3.375 g        3.375 g 100 mL/hr over 30 Minutes Intravenous  Once 02/19/21 1809 02/19/21 1944        Assessment/Plan POD 25 s/p ex lap, sigmoid colectomy, end colostomy with enterorrhaphy and drainage of intraabdominal abscess - Dr. Kieth Brightly and Dr. Radene Knee 9/10 for Acute sigmoid diverticulitis with contained perforation and abscess - CT 9/16 demonstrates heterogenous left iliopsoas collection likely hematoma, stranding in the retroperitoneum and subcu tissue.  Distal  colon appears decompressed and very small caliber but there is contrast entering this area; there is additionally a similarly small-caliber segment of colon in the proximal transverse and the ascending colon is not dilated- suspect over all this represents small bowel/gastric ileus.  IR does not recommend draining the hematoma at this point. ID following and recommending Zosyn through 10/17  - NGT replaced 9/27  - CT 9/27 with continued complex fluid collection along left psoas, SBO with transition in mid jejunum, post-surgical changes of colon resection with colostomy - Was started on reglan last weekend. Discussed with him the risk of tardive dyskinesia/EPS on 9/20. Patient stated understanding and is okay with continuing Reglan.  -  Gastrograffin study 9/28 showed Stable gaseous distention of the small bowel, oral contrast progressed into the colon - CT 10/4 with ileus although improved distention of small bowel, stable psoas collection - continue clamping trials with cyclic clamping - put back to suction at 2100 today and leave on suction until 0700 tomorrow AM and record drainage amount in that time frame; if patient becomes nauseated during clamping phase then place back on LIWS - pt may have ice chips/clears with NGT clamped - WOCN following for new ostomy teaching and vac changes - continue VAC through the weekend, but wound is much shallower and likely will DC vac next week - Cont TPN - Encouraged ambulation (working with therapies), use IS, multimodal pain control  - Cont abx per ID recs   FEN - ok to have sips of clears and ice chips with NGT clamped, NGT clamping, TPN VTE - SCDs, therapeutic lovenox  ID - Zosyn 9/4 - 10/17 (per ID) Foley - out, voiding.    Per primary: A fib - cards following  AKI - resolved ABL anemia -  stable HTN ETOH use L knee pain - per ortho  LOS: 31 days    Norm Parcel, [facility] Surgery 03/23/2021, 9:52 AM Please see Amion for pager number during day hours 7:00am-4:30pm

## 2021-03-23 NOTE — Progress Notes (Signed)
[place] for Infectious Disease  Date of Admission:  02/19/2021      Total days of antibiotics: 31   Zosyn 9/04 >>         ASSESSMENT: LIEUTENANT ABARCA is a 70 y.o. 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 New pelvic fluid collection noted on 9/16 that was treated medically.   Repeated CT scan 10/04 - stable appearing heterogeneous left iliopsoas, no fluid collections noted. Pelvic fluid collection has resolved. Overall improved dilation of small bowel with ongoing small SBO possibly. With resolution of pelvic fluid collection and absent fluid levels of the lt iliopsoas area will plan to stop zosyn after 48h. Possible this was intramuscular hematoma, but if it was infection suspect this has been adequately treated.   Medication monitoring - tolerating antibiotics with out any determined side effects.     PLAN: Continue zosyn another 48 hours then stop  Follow for changes in abdominal symptoms, fevers, leukocytosis Patient seems to be planned for CIR  Will sign off - please call back with any changes in his condition or concerns.    Principal Problem:   Diverticulitis of large intestine with perforation and abscess without bleeding Active Problems:   Diverticulitis of intestine with abscess   Atrial fibrillation, chronic (HCC)   Essential hypertension   Diverticulitis   AKI (acute kidney injury) ([place])   Severe sepsis with acute organ dysfunction (HCC)   Intra-abdominal abscess (HCC)   Epididymitis    Chlorhexidine Gluconate Cloth  6 each Topical Daily   enoxaparin (LOVENOX) injection  100 mg Subcutaneous Q12H   furosemide  40 mg Intravenous Daily   insulin aspart  0-15 Units Subcutaneous Q4H   ketorolac  15 mg Intravenous Q6H   lidocaine  1 patch Transdermal Q24H   lidocaine  1 patch Transdermal Q24H   metoCLOPramide  (REGLAN) injection  10 mg Intravenous Q6H   nicotine  21 mg Transdermal Daily   pantoprazole (PROTONIX) IV  40 mg Intravenous Q12H   sodium chloride flush  10 mL Intracatheter Q12H    SUBJECTIVE: Having rough nights with restlessness and anxiety after his wife leaves. No new complaints. Starting longer clamping trials of NGT   Review of Systems: Review of Systems  Constitutional:  Negative for chills and fever.  HENT:  Negative for tinnitus.   Eyes:  Negative for blurred vision and photophobia.  Respiratory:  Negative for cough, sputum production and shortness of breath.   Cardiovascular:  Negative for chest pain.  Gastrointestinal:  Positive for abdominal pain. Negative for diarrhea, nausea and vomiting.  Genitourinary:  Negative for dysuria.  Musculoskeletal:  Positive for joint pain (Lt knee).  Skin:  Negative for rash.  Neurological:  Negative for headaches.  Psychiatric/Behavioral:  The patient is nervous/anxious and has insomnia.     Allergies  Allergen Reactions   Atorvastatin Other (See Comments)    myalgias    OBJECTIVE: Vitals:   03/22/21 1907 03/22/21 2319 03/23/21 0309 03/23/21 0745  BP: (!) 141/66 (!) 118/54 (!) 122/56 (!) 118/56  Pulse: (!) 112 74 74 72  Resp: 20 18 18 19   Temp: 97.7 F (36.5 C) 98 F (36.7 C) 98 F (36.7 C) (!) 97.3 F (36.3 C)  TempSrc: Oral Oral Axillary Axillary  SpO2: 93% 93% 94% 93%  Weight:      Height:  Body mass index is 29.57 kg/m.  Physical Exam Vitals reviewed.  Constitutional:      Appearance: He is well-developed.     Comments: Seated comfortably on side of bed.   HENT:     Mouth/Throat:     Dentition: Normal dentition. No dental abscesses.  Cardiovascular:     Rate and Rhythm: Normal rate and regular rhythm.     Heart sounds: Normal heart sounds.  Pulmonary:     Effort: Pulmonary effort is normal.     Breath sounds: Normal breath sounds.     Comments: Breathing comfortably on room air Abdominal:      General: Bowel sounds are decreased. There is no distension.     Palpations: Abdomen is soft.     Tenderness: There is no abdominal tenderness.     Comments: Ostomy site intact  Lymphadenopathy:     Cervical: No cervical adenopathy.  Skin:    General: Skin is warm and dry.     Findings: No rash.  Neurological:     Mental Status: He is alert and oriented to person, place, and time.  Psychiatric:        Judgment: Judgment normal.    Lab Results Lab Results  Component Value Date   WBC 9.2 03/23/2021   HGB 7.4 (L) 03/23/2021   HCT 23.3 (L) 03/23/2021   MCV 97.5 03/23/2021   PLT 281 03/23/2021    Lab Results  Component Value Date   CREATININE 1.01 03/23/2021   BUN 42 (H) 03/23/2021   NA 135 03/23/2021   K 3.7 03/23/2021   CL 99 03/23/2021   CO2 28 03/23/2021    Lab Results  Component Value Date   ALT 31 03/21/2021   AST 27 03/21/2021   ALKPHOS 75 03/21/2021   BILITOT 0.5 03/21/2021     Microbiology: No results found for this or any previous visit (from the past 240 hour(s)).   Janene Madeira, MSN, NP-C [facility] for Infectious Disease [email or web address] Pager: [phone] Office: [phone] [place]: [phone]

## 2021-03-23 NOTE — TOC Progression Note (Addendum)
Transition of Care [facility]) - Progression Note    Patient Details  Name: Travis Conrad MRN: [id number] Date of Birth: Dec 04, 1950  Transition of Care [facility]) CM/SW Contact  Joanne Chars, LCSW Phone Number: 03/23/2021, 10:21 AM  Clinical Narrative:   CSW spoke with pt wife who had received letter from insurance company about wound vac, wanted to make sure they are not being charged while pt is in the hospital.  [place] spoke with Emilio Aspen who said she will pick the wound vac up since DC has been delayed.  Can reorder when DC date is more clear.  TOC continues to follow for DC needs.      Expected Discharge Plan: [place] Barriers to Discharge: Continued Medical Work up  Expected Discharge Plan and Services Expected Discharge Plan: [place] In-house Referral: Clinical Social Work   Post Acute Care Choice: [place] arrangements for the past 2 months: Single Family Home                                       Social Determinants of Health (SDOH) Interventions    Readmission Risk Interventions No flowsheet data found.

## 2021-03-23 NOTE — Progress Notes (Signed)
° °  Inpatient Rehab Admissions Coordinator : ° °Per therapy change in recommendations, patient was screened for CIR candidacy by Glennice Marcos RN MSN.  At this time patient appears to be a potential candidate for CIR. I will place a rehab consult per protocol for full assessment. Please call me with any questions. ° °Yun Gutierrez RN MSN °Admissions Coordinator °[phone] °  °

## 2021-03-23 NOTE — Consult Note (Signed)
Travis Conrad wound follow up Patient receiving care in [facility] 2W01 Travis Boards, Travis Conrad present to evaluate wound. Will possibly discontinue the wound vac Monday 03/28/21 Wound type: Surgical midline incision Wound bed: Clean, pink granulation tissue Measurements: 10.2 cm x 3.5 cm x 0.7 cm Drainage (amount, consistency, odor) Sanguinous in canister Periwound: intact Dressing procedure/placement/frequency: One piece of black foam dressing removed. One piece of black foam replaced. Drape applied, immediate seal obtained at 125 mmHg WOC to follow M/W/F   Travis Conrad ostomy follow up Stoma type/location: LUQ colostomy Stomal assessment/size: 1 3/8" red budded just above the level of the skin. Sutures in place.  Peristomal assessment: intact Treatment options for stomal/peristomal skin: Barrier ring Output: emptied 125 cc of dark green/black sticky stool. Ostomy pouching: 1pc. convex Education provided: Wife was present and prepared the pouch and placed while I was changing out the vac dressing.  Enrolled patient in [place] Start Discharge program: Yes (previously)   Travis Conrad. Travis Julian, MSN, Travis Conrad, [name], [name], [facility] Wound Treatment Associate Pager [phone]

## 2021-03-23 NOTE — Progress Notes (Signed)
Occupational Therapy Treatment Patient Details Name: Travis Conrad MRN: [id number] DOB: Mar 24, 1951 Today's Date: 03/23/2021   History of present illness 70 yo admitted 9/3 with abdominal pain, vomiting and ileus. 9/4 Afib with RVR. 9/8 intrabdominal abscess s/p IR drain placed. 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 Pt found to have lt iliopsoas hematoma on CT 9/16.  9/20 left knee aspiration. 9/26 NG tube removed. 9/27 NGT replaced due to vomiting. 10/1 atelectasis. PMhx: HTN, HLD, Afib, ETOH use   OT comments  Pt received in bed with wife present, pt agreeable to OT session. Pt requires modA for bed mobility, minguard-minA for functional mobility at RW level. Pt limited by decreased acitivty tolerance, generalized weakness and instability. Pt setup with items for grooming while seated in recliner. SpO2 90%-93% on RA. Given functional decline in previous week, updated d/c recommendation to CIR as pt would greatly benefit from CIR level therapy to maximize progression with strengthening, activity tolerance, cognition, stability and cardiopulmonary function to allow safe d/c home with his wife. Will continue to follow acutely.    Recommendations for follow up therapy are one component of a multi-disciplinary discharge planning process, led by the attending physician.  Recommendations may be updated based on patient status, additional functional criteria and insurance authorization.    Follow Up Recommendations  CIR    Equipment Recommendations  3 in 1 bedside commode    Recommendations for Other Services      Precautions / Restrictions Precautions Precautions: Fall;Other (comment) Precaution Comments: wound vAC,  NGT Restrictions Weight Bearing Restrictions: No       Mobility Bed Mobility Overal bed mobility: Needs Assistance Bed Mobility: Rolling;Sidelying to Sit Rolling: Min assist Sidelying to sit: Mod assist        General bed mobility comments: minA for cues to roll toward left;modA to powerup trunk to upright position. assist for [name] with progression to EOB    Transfers Overall transfer level: Needs assistance Equipment used: Rolling walker (2 wheeled) Transfers: Sit to/from Stand Sit to Stand: Min guard Stand pivot transfers: Min guard       General transfer comment: minguard for sit<>stand from slightly elevated bed level;minguard for stand pivot from EOB to recliner    Balance Overall balance assessment: Needs assistance Sitting-balance support: Feet supported;No upper extremity supported Sitting balance-Leahy Scale: Good Sitting balance - Comments: EOB without UE support   Standing balance support: Bilateral upper extremity supported;During functional activity Standing balance-Leahy Scale: Poor Standing balance comment: reliant on RW in standing                           ADL either performed or assessed with clinical judgement   ADL Overall ADL's : Needs assistance/impaired Eating/Feeding: NPO Eating/Feeding Details (indicate cue type and reason): NG tube in place;pt is allows sips of water and ice chips, independent with drinking Grooming: Set up;Sitting Grooming Details (indicate cue type and reason): setup with items while sitting in recliner Upper Body Bathing: Set up;Supervision/ safety;Sitting           Lower Body Dressing: Moderate assistance Lower Body Dressing Details (indicate cue type and reason): modA to access BLE to don socks Toilet Transfer: Min Designer, jewellery Details (indicate cue type and reason): stand pivot transfer from EOB to recliner;pt with ostomy and condom catheter Toileting- Clothing Manipulation and Hygiene: Moderate assistance Toileting - Clothing Manipulation Details (indicate cue type and reason): modA to  wash bottom while standing     Functional mobility during ADLs: Minimal assistance;Rolling walker General ADL  Comments: minguard for ambulation;minA for dynamic tasks while standing     Vision   Vision Assessment?: No apparent visual deficits   Perception     Praxis      Cognition Arousal/Alertness: Awake/alert Behavior During Therapy: Anxious Overall Cognitive Status: Impaired/Different from baseline Area of Impairment: Attention;Following commands;Safety/judgement;Problem solving                   Current Attention Level: Selective Memory: Decreased short-term memory Following Commands: Follows multi-step commands with increased time Safety/Judgement: Decreased awareness of safety;Decreased awareness of deficits   Problem Solving: Slow processing General Comments: pt with decreased short term memory, requires increased time for problem solving and following multistep commands. Pt HOH        Exercises     Shoulder Instructions       General Comments spO2 90%-93% on RA with exertion;HR 70s at rest    Pertinent Vitals/ Pain       Pain Assessment: 0-10 Pain Score: 5  Pain Location: abdomen Pain Descriptors / Indicators: Aching;Guarding Pain Intervention(s): Monitored during session  Home Living                                          Prior Functioning/Environment              Frequency  Min 2X/week        Progress Toward Goals  OT Goals(current goals can now be found in the care plan section)  Progress towards OT goals: Progressing toward goals  Acute Rehab OT Goals Patient Stated Goal: to get back to baseline OT Goal Formulation: With patient Time For Goal Achievement: 04/06/21 Potential to Achieve Goals: Good ADL Goals Pt Will Perform Grooming: with modified independence;standing Pt Will Perform Lower Body Bathing: with modified independence;sit to/from stand Pt Will Perform Lower Body Dressing: with modified independence;with adaptive equipment Pt Will Transfer to Toilet: with supervision;ambulating Pt Will Perform Toileting  - Clothing Manipulation and hygiene: with supervision;sitting/lateral leans;sit to/from stand Additional ADL Goal #1: Pt will independently verbalize 3 strateiges to reduce risk of falls  Plan Discharge plan needs to be updated    Co-evaluation                 AM-PAC OT "6 Clicks" Daily Activity     Outcome Measure   Help from another person eating meals?: Total Help from another person taking care of personal grooming?: A Little Help from another person toileting, which includes using toliet, bedpan, or urinal?: A Little Help from another person bathing (including washing, rinsing, drying)?: A Little Help from another person to put on and taking off regular upper body clothing?: A Little Help from another person to put on and taking off regular lower body clothing?: A Lot 6 Click Score: 15    End of Session Equipment Utilized During Treatment: Rolling walker  OT Visit Diagnosis: Muscle weakness (generalized) (M62.81);Pain   Activity Tolerance Patient tolerated treatment well   Patient Left in bed;with call bell/phone within reach;with bed alarm set;with family/visitor present   Nurse Communication Mobility status        Time: 2409-7353 OT Time Calculation (min): 49 min  Charges: OT General Charges $OT Visit: 1 Visit OT Treatments $Self Care/Home Management : 38-52 mins  Katrenia Alkins OTR/L Acute Rehabilitation  Services Office: [place] 03/23/2021, 11:13 AM

## 2021-03-23 NOTE — Progress Notes (Signed)
Physical Therapy Treatment Patient Details Name: Travis Conrad MRN: [id number] DOB: [birth year] Today's Date: 03/23/2021   History of Present Illness 70 yo admitted 9/3 with abdominal pain, vomiting and ileus. 9/4 Afib with RVR. 9/8 intrabdominal abscess s/p IR drain placed. 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 Pt found to have lt iliopsoas hematoma on CT 9/16.  9/20 left knee aspiration. 9/26 NG tube removed. 9/27 NGT replaced due to vomiting. 10/1 atelectasis. PMhx: HTN, HLD, Afib, ETOH use    PT Comments    Pt progressed well in session today.  Emphasis on roll to transition via L elbow to EOB,  Strengthening and control standing exercise at EOB with rest between and progression of gait quality and stability n the hall with the RW .   Recommendations for follow up therapy are one component of a multi-disciplinary discharge planning process, led by the attending physician.  Recommendations may be updated based on patient status, additional functional criteria and insurance authorization.  Follow Up Recommendations  Supervision/Assistance - 24 hour;CIR     Equipment Recommendations  3in1 (PT)    Recommendations for Other Services       Precautions / Restrictions Precautions Precautions: Fall Precaution Comments: wound VAC,  NGT     Mobility  Bed Mobility Overal bed mobility: Needs Assistance Bed Mobility: Rolling;Sidelying to Sit Rolling: Min assist Sidelying to sit: Mod assist       General bed mobility comments: cues for sequencing,  no assist needed to hooklying, but somewhat a struggle with L LE.  min to roll and mod truncal assist up only L elbow.    Transfers Overall transfer level: Needs assistance Equipment used: Rolling walker (2 wheeled) Transfers: Sit to/from Stand Sit to Stand: Min guard;Min assist (min if surface is not elevated.)         General transfer comment: cues/demo for best technique,  forward assist if surface is low.  Ambulation/Gait Ambulation/Gait assistance: Min assist Gait Distance (Feet): 220 Feet Assistive device: Rolling walker (2 wheeled) Gait Pattern/deviations: Step-through pattern   Gait velocity interpretation: <1.8 ft/sec, indicate of risk for recurrent falls General Gait Details: generally steady, cues for posture and proximity to the RW.  progressively less steady as he fatigued with cues to stay under control and not rush to the sit down point.   Stairs             Wheelchair Mobility    Modified Rankin (Stroke Patients Only)       Balance     Sitting balance-Leahy Scale: Good       Standing balance-Leahy Scale: Poor Standing balance comment: reliant on RW in standing                            Cognition Arousal/Alertness: Awake/alert Behavior During Therapy: Anxious;WFL for tasks assessed/performed Overall Cognitive Status: Impaired/Different from baseline                     Current Attention Level: Selective   Following Commands: Follows one step commands consistently Safety/Judgement: Decreased awareness of safety;Decreased awareness of deficits   Problem Solving: Slow processing        Exercises General Exercises - Lower Extremity Hip ABduction/ADduction: AROM;Strengthening;Both;10 reps;Standing Hip Flexion/Marching: AROM;Strengthening;Both;10 reps;Standing Heel Raises: AROM;Strengthening;10 reps;Standing Mini-Sqauts: AROM;10 reps;Standing    General Comments General comments (skin integrity, edema, etc.): HR at rest low 90's, sats upper 90's,  HR after exercise and gait 110's and max of 121 bpm, sats 95%      Pertinent Vitals/Pain Pain Assessment: Faces Faces Pain Scale: Hurts a little bit Pain Location: abdomen Pain Intervention(s): Monitored during session    Home Living                      Prior Function            PT Goals (current goals can now be found in the care  plan section) Acute Rehab PT Goals Patient Stated Goal: to get back to baseline PT Goal Formulation: With patient/family Time For Goal Achievement: 03/28/21 Potential to Achieve Goals: Good Progress towards PT goals: Progressing toward goals    Frequency    Min 3X/week      PT Plan Current plan remains appropriate    Co-evaluation              AM-PAC PT "6 Clicks" Mobility   Outcome Measure  Help needed turning from your back to your side while in a flat bed without using bedrails?: A Little Help needed moving from lying on your back to sitting on the side of a flat bed without using bedrails?: A Little Help needed moving to and from a bed to a chair (including a wheelchair)?: A Little Help needed standing up from a chair using your arms (e.g., wheelchair or bedside chair)?: A Little Help needed to walk in hospital room?: A Lot   6 Click Score: 14    End of Session   Activity Tolerance: Patient tolerated treatment well;Patient limited by fatigue Patient left: in bed;with call bell/phone within reach;with bed alarm set Nurse Communication: Mobility status PT Visit Diagnosis: Other abnormalities of gait and mobility (R26.89);Difficulty in walking, not elsewhere classified (R26.2)     Time: 7893-8101 PT Time Calculation (min) (ACUTE ONLY): 42 min  Charges:  $Gait Training: 8-22 mins $Therapeutic Exercise: 8-22 mins $Therapeutic Activity: 8-22 mins                     03/23/2021  Ginger Carne., PT Acute Rehabilitation Services [phone]  (pager) [phone]  (office)   Tessie Fass Leslye Puccini 03/23/2021, 3:13 PM

## 2021-03-23 NOTE — Progress Notes (Addendum)
PROGRESS NOTE    [name]  VEL:[id number] DOB: 11/02/1950 DOA: 02/19/2021 PCP: [name], MD    Brief Narrative:  [name] was admitted to the hospital with the working diagnosis of severe sepsis due to perforated diverticulitis/ intra-abdominal abscess. Now sp lapartomy, with colectomy, complicated with postoperative ileus.   70 year old male past medical history for atrial fibrillation, dyslipidemia, hypertension and diverticulosis who presented with nausea, vomiting and abdominal pain.  Reported several days of worse abdominal pain, and poor oral intake.  Because of severe symptoms he presented to the hospital.  On his initial physical examination his blood pressure was 84/65, heart rate 57, respiratory rate 20, oxygen saturation 91%, he was ill-looking appearing, heart S1-S2, present, irregular irregular, lungs clear to auscultation, abdomen tender to palpation in the mid abdomen, no lower extremity edema.   CT of the abdomen and pelvis with acute sigmoid diverticulitis with small extraluminal gas and fluid collection in the sigmoid mesocolon, 3.5 x 1.5 x 2.7 cm consistent with perforation and abscess.  Dilated proximal and decompressed distal small bowel loops, possible ileus.   9/4 developed atrial fibrillation with rapid ventricular response, required intravenous amiodarone infusion.   Follow-up CT 9/7 with increased size pelvic abscess. On 9/8 patient underwent CT-guided drainage of pelvic abscess.   09/10 Patient underwent exploratory laparotomy with sigmoid colectomy and diverting colostomy placement.  Postoperative ileus.   Urology and ID have been consulted for epididymitis.   9/19 Postoperative anemia: Required 1 unit packed red blood cells.    Positive left knee pain, positive effusion, underwent left knee aspiration and injection with good toleration.  Fluid with no crystals, gram stain no organisms.   Slowly resolving ileus. Patient had NG tube clamped but  patient vomiting, needing to reconnect tube to suction.     09/26 NG tube has been removed.   09/27 patient with vomiting and abdominal distention, abdominal radiographs with recurrent ileus, NG tube has replaced and connected to low intermittent suction.    Assessment & Plan:   Principal Problem:   Diverticulitis of large intestine with perforation and abscess without bleeding Active Problems:   Diverticulitis of intestine with abscess   Atrial fibrillation, chronic (HCC)   Essential hypertension   Diverticulitis   AKI (acute kidney injury) ([place])   Severe sepsis with acute organ dysfunction (HCC)   Intra-abdominal abscess (HCC)   Epididymitis   Severe sepsis due to perforated diverticulitis, sp colectomy. Left psoas collection. Post operative ileus.  Patient with persistent/ recurrent ileus, NG tube was replaced 03/14/2021.  Ileus managed by general surgery.  CT 03/15/2021 shows continued complex fluid collection along left iliopsoas.  SBO with transition in mid jejunum.  Surgery consulted IR to evaluate for drainage of the left psoas abscess however, IR do not recommend aspirating at this point in time.  No pain today.  S/p Gastrografin study on 03/16/2021.  Patient being managed by intermittent NG tube clamping.  Remains on TPN.  Management per surgery.  Patient remains on Zosyn with end date of 03/25/2021 as recommended by ID.  ID had documented in their latest note to notify them if patient happens to be here through 03/24/2021.  ID consulted ID on 03/22/2021. CT 10/4 with ileus although improved distention of small bowel, stable psoas collection.  Continue current management per general surgery but awaiting further recommendations by ID.   2. Post operative anemia. sp 1 unit PRBC on 03/07/2021. Hemoglobin remains stable.  Monitor closely   3. Paroxysmal atrial fibrillation/ HTN.  Patient was on amiodarone and metoprolol with good rate control. On enoxaparin for anticoagulation, holding  DOAC until po intake more consistent.  Due to recurrent ileus, on 03/16/2021, general surgery recommended transitioning all oral or enteral medications to IV route.  Amiodarone and metoprolol was stopped and he was started on IV metoprolol 10 mg daily 8 hours.  Patient's rates are controlled around 100 and he is only on 5 mg of Cardizem.  Due to significant wheezes, IV metoprolol was also discontinued on 03/21/2021.  Rates are still under control.   4. Right epididymitis resolved, completed therapy with doxycycline.    5. AKI with hypokalemia/ hyperkalemia,hyponatremia and hypophosphatemia.  Continue TPN per pharmacy protocol.  All electrolytes normal with AKI resolved.   6. Alcohol withdrawal. Resolved.    7. Left knee arthritis. Clinically improved after steroid injection.  Pain is well controlled.   8.  Essential hypertension: Controlled.  Amlodipine discontinued due to recurrent ileus and surgery recommendations to withhold oral medications.  9.  Dyspnea/atelectasis acute hypoxic respiratory failure: Continues to improve and feels much better.  No more wheezes.  Off of oxygen.  We will discontinue Solu-Medrol and continue as needed DuoNeb.  Continue to hold metoprolol.  Will reconsider resuming in next 1 to 2 days with lower dose.  10.  Anemia of chronic disease: Hemoglobin trending down and currently 7.4.  Monitor closely.  Will need transfusion if drops less than 7.  Status is: Inpatient  Remains inpatient appropriate because:Inpatient level of care appropriate due to severity of illness  Dispo: The patient is from: Home              Anticipated d/c is to: Home              Patient currently is not medically stable to d/c.   Difficult to place patient No   DVT prophylaxis: Enoxxaparin   Code Status:    full  Family Communication:  Plan discussed with his wife at bedside.    Nutrition Status: Nutrition Problem: Inadequate oral intake Etiology: acute illness  (diverticulitis) Signs/Symptoms: per patient/family report, NPO status Interventions: Prostat, Boost Plus    Consultants:  Surgery   Subjective: Patient seen and examined.  He states that he is feeling much better.  No complaints.  Fully alert and oriented.  Objective: Vitals:   03/22/21 1907 03/22/21 2319 03/23/21 0309 03/23/21 0745  BP: (!) 141/66 (!) 118/54 (!) 122/56 (!) 118/56  Pulse: (!) 112 74 74 72  Resp: 20 18 18 19   Temp: 97.7 F (36.5 C) 98 F (36.7 C) 98 F (36.7 C) (!) 97.3 F (36.3 C)  TempSrc: Oral Oral Axillary Axillary  SpO2: 93% 93% 94% 93%  Weight:      Height:        Intake/Output Summary (Last 24 hours) at 03/23/2021 1048 Last data filed at 03/23/2021 0116 Gross per 24 hour  Intake 346.36 ml  Output 1270 ml  Net -923.64 ml    Filed Weights   03/07/21 0342 03/10/21 0447 03/16/21 0343  Weight: 94 kg 102 kg 98.9 kg    Examination:  General exam: Appears calm and comfortable  Respiratory system: Clear to auscultation. Respiratory effort normal. Cardiovascular system: S1 & S2 heard, RRR. No JVD, murmurs, rubs, gallops or clicks.  +1 pitting edema bilateral lower extremity Gastrointestinal system: Abdomen is nondistended, soft and mild generalized tenderness with wound VAC attached. No organomegaly or masses felt. Normal bowel sounds heard. Central nervous system: Alert and oriented. No  focal neurological deficits. Extremities: Symmetric 5 x 5 power. Skin: No rashes, lesions or ulcers.  Psychiatry: Judgement and insight appear normal. Mood & affect appropriate.    Data Reviewed: I have personally reviewed following labs and imaging studies  CBC: Recent Labs  Lab 03/17/21 0442 03/20/21 0439 03/23/21 0437  WBC 5.8 11.7* 9.2  HGB 8.3* 8.1* 7.4*  HCT 25.5* 26.0* 23.3*  MCV 101.2* 101.2* 97.5  PLT 244 198 992    Basic Metabolic Panel: Recent Labs  Lab 03/17/21 0442 03/18/21 0138 03/19/21 0244 03/20/21 0439 03/21/21 0406 03/23/21 0437   NA 141 141 141 142 139 135  K 3.7 3.5 4.3 3.7 3.8 3.7  CL 109 112* 109 108 105 99  CO2 25 23 26 26 27 28   GLUCOSE 144* 132* 125* 144* 245* 203*  BUN 24* 25* 25* 28* 33* 42*  CREATININE 1.15 1.11 1.17 1.14 1.07 1.01  CALCIUM 7.8* 7.7* 7.9* 7.8* 7.7* 7.8*  MG 2.1  --  2.2 2.2 2.3  --   PHOS 3.1  --  3.2  --  3.3  --     GFR: Estimated Creatinine Clearance: 82.9 mL/min (by C-G formula based on SCr of 1.01 mg/dL). Liver Function Tests: Recent Labs  Lab 03/17/21 0442 03/21/21 0406  AST 30 27  ALT 30 31  ALKPHOS 90 75  BILITOT 0.2* 0.5  PROT 5.1* 5.6*  ALBUMIN 1.5* <1.5*    No results for input(s): LIPASE, AMYLASE in the last 168 hours. No results for input(s): AMMONIA in the last 168 hours. Coagulation Profile: No results for input(s): INR, PROTIME in the last 168 hours. Cardiac Enzymes: No results for input(s): CKTOTAL, CKMB, CKMBINDEX, TROPONINI in the last 168 hours. BNP (last 3 results) No results for input(s): PROBNP in the last 8760 hours. HbA1C: No results for input(s): HGBA1C in the last 72 hours. CBG: Recent Labs  Lab 03/22/21 1604 03/22/21 1901 03/22/21 2316 03/23/21 0305 03/23/21 0739  GLUCAP 206* 171* 173* 179* 184*    Lipid Profile: Recent Labs    03/21/21 0406  TRIG 186*    Thyroid Function Tests: No results for input(s): TSH, T4TOTAL, FREET4, T3FREE, THYROIDAB in the last 72 hours. Anemia Panel: No results for input(s): VITAMINB12, FOLATE, FERRITIN, TIBC, IRON, RETICCTPCT in the last 72 hours.   Radiology Studies: I have reviewed all of the imaging during this hospital visit personally  Scheduled Meds:  Chlorhexidine Gluconate Cloth  6 each Topical Daily   enoxaparin (LOVENOX) injection  100 mg Subcutaneous Q12H   furosemide  40 mg Intravenous Daily   insulin aspart  0-15 Units Subcutaneous Q4H   ketorolac  15 mg Intravenous Q6H   lidocaine  1 patch Transdermal Q24H   lidocaine  1 patch Transdermal Q24H   methylPREDNISolone  (SOLU-MEDROL) injection  40 mg Intravenous Q12H   metoCLOPramide (REGLAN) injection  10 mg Intravenous Q6H   nicotine  21 mg Transdermal Daily   pantoprazole (PROTONIX) IV  40 mg Intravenous Q12H   sodium chloride flush  10 mL Intracatheter Q12H   Continuous Infusions:  sodium chloride 10 mL/hr at 03/20/21 0311   diltiazem (CARDIZEM) infusion 5 mg/hr (03/22/21 1506)   methocarbamol (ROBAXIN) IV 1,000 mg (03/23/21 0559)   piperacillin-tazobactam (ZOSYN)  IV 3.375 g (03/23/21 0745)   TPN ADULT (ION) 85 mL/hr at 03/22/21 1738   TPN ADULT (ION)       LOS: 31 days   Total time spent in minutes: [place], MD [facility] Hospitalist

## 2021-03-23 NOTE — Progress Notes (Signed)
Pts tube came out the nose while ambulating from the chair to the bed, pt stated that it was loose and needed to be taped back to his nose prior to it falling out

## 2021-03-23 NOTE — Progress Notes (Addendum)
PHARMACY - TOTAL PARENTERAL NUTRITION CONSULT NOTE  Indication: Prolonged ileus  Patient Measurements: Height: 6' (182.9 cm) Weight: 98.9 kg (218 lb 0.6 oz) IBW/kg (Calculated) : 77.6 TPN AdjBW (KG): 82.6 Body mass index is 29.57 kg/m.  Assessment:  39 YOM with lower abdominal pain/N/V found to have perforated diverticulitis. CT abd on 9/3 found to have active ileus/pSBO from diverticulitis/bowel inflammation. Pharmacy consulted to manage TPN for prolonged ileus. Of note patient has a history pf alcohol dependency and is at high risk for refeeding.   Glucose / Insulin: no hx DM, A1c 5.9% - CBGs 171-227/24hr. Used 22 units SSI in past 24 hrs, 15 units insulin in TPN. Patient started on methylprednisone 40mg  q12hr 10/3 leading to increased CBGs acutely.   Electrolytes: K 3.7 (goal >/= 4 with ileus), Ca 7.8 (with albumin <1.5)   Renal: SCr trending down to 1.01, BUN 42 up.  Hepatic: LFTs / tbili  WNL, TG 68>>186, albumin <1.5  Intake / Output; MIVF: NG 750 ml/24hr, clamping trials ongoing, drain removed, 23ml from colostomy, UOP 2233ml = 0.60ml/kg/hr,  - lasix increased to 40mg  IV daily  GI Meds: IV Reglan/6hr, PPI IV/12h ID: [email or web address] through 98/33/82 for complicated diverticulitis with IA abscess  GI Imaging:  - 9/3 CT abd: Active ileus/PSBO - 9/16 CT abd: L-iliopsoas fluid collection (abscess or hematoma), resolution of prior diverticular abscess, still w/ evidence of ileus - 9/27 CT abd: continued retroperitoneal complex fluid collection, SBO - 10/4: CT: Left iliopsoas enlargement heterogeneity persists, with fluid fluid level is no longer identified. Findings may represent intramuscular hematoma, infection or mass.Prominent proximal small bowel without definite transition point favored as ileus. Degree of small-bowel distention has decreased. Partial small bowel obstruction cannot be entirely excluded.  GI Surgeries / Procedures:  - 9/10 ex-lap, sigmoid colectomy w/ end colostomy  creation, enterorrhaphy, drainage of intra-abd abscess, wound vac placement  Central access: PICC line 02/24/21 TPN start date: 02/25/21  Nutritional Goals, RD Estimated Needs Total Energy Estimated Needs: 2000-2200 Total Protein Estimated Needs: 100-110 grams Total Fluid Estimated Needs: >2L  Current Nutrition:  TPN CLD started 9/23 > NPO on 9/27 with NG to LIWS   Plan:  Continue TPN at goal rate 85 ml/hr to provide 102g AA, 367g CHO and 45g ILE for a total of 2104 kCal, meeting 100% of patient's needs Electrolytes in TPN: Na 150,  incr K 55 mEq/L, Ca 2.89mEq/L, Mg 7mEq/L, Phos 49mmol/L. Cl:Ac 1:2  Monitor K closely while diuresis is ongoing. May need to reduce in bag if no longer diuresing. F/u discontinuation of steroids to reduce insulin in TPN TG trending up 86>>186. Ordered TG for Thursday Add standard MVI, trace elements, folate and thiamine to TPN Continue moderate SSI Q4H and 25 units regular insulin in TPN Standard TPN labs on Mon and Thurs and prn   Travis Conrad S. Travis Conrad, PharmD, BCPS Clinical Staff Pharmacist [email or web address]

## 2021-03-24 ENCOUNTER — Inpatient Hospital Stay: Payer: Medicare Other | Admitting: Family

## 2021-03-24 ENCOUNTER — Inpatient Hospital Stay (HOSPITAL_COMMUNITY): Payer: Medicare Other

## 2021-03-24 DIAGNOSIS — R609 Edema, unspecified: Secondary | ICD-10-CM | POA: Diagnosis not present

## 2021-03-24 DIAGNOSIS — K5720 Diverticulitis of large intestine with perforation and abscess without bleeding: Secondary | ICD-10-CM | POA: Diagnosis not present

## 2021-03-24 LAB — COMPREHENSIVE METABOLIC PANEL
ALT: 47 U/L — ABNORMAL HIGH (ref 0–44)
AST: 33 U/L (ref 15–41)
Albumin: 1.7 g/dL — ABNORMAL LOW (ref 3.5–5.0)
Alkaline Phosphatase: 88 U/L (ref 38–126)
Anion gap: 10 (ref 5–15)
BUN: 44 mg/dL — ABNORMAL HIGH (ref 8–23)
CO2: 28 mmol/L (ref 22–32)
Calcium: 8.3 mg/dL — ABNORMAL LOW (ref 8.9–10.3)
Chloride: 100 mmol/L (ref 98–111)
Creatinine, Ser: 1.19 mg/dL (ref 0.61–1.24)
GFR, Estimated: >60 mL/min (ref >60)
Glucose, Bld: 177 mg/dL — ABNORMAL HIGH (ref 70–99)
Potassium: 3.7 mmol/L (ref 3.5–5.1)
Sodium: 138 mmol/L (ref 135–145)
Total Bilirubin: 0.6 mg/dL (ref 0.3–1.2)
Total Protein: 6.1 g/dL — ABNORMAL LOW (ref 6.5–8.1)

## 2021-03-24 LAB — CBC
HCT: 25.2 % — ABNORMAL LOW (ref 39.0–52.0)
Hemoglobin: 7.7 g/dL — ABNORMAL LOW (ref 13.0–17.0)
MCH: 30.2 pg (ref 26.0–34.0)
MCHC: 30.6 g/dL (ref 30.0–36.0)
MCV: 98.8 fL (ref 80.0–100.0)
Platelets: 346 10*3/uL (ref 150–400)
RBC: 2.55 MIL/uL — ABNORMAL LOW (ref 4.22–5.81)
RDW: 15.2 % (ref 11.5–15.5)
WBC: 8 10*3/uL (ref 4.0–10.5)
nRBC: 0.7 % — ABNORMAL HIGH (ref 0.0–0.2)

## 2021-03-24 LAB — GLUCOSE, CAPILLARY
Glucose-Capillary: 130 mg/dL — ABNORMAL HIGH (ref 70–99)
Glucose-Capillary: 134 mg/dL — ABNORMAL HIGH (ref 70–99)
Glucose-Capillary: 152 mg/dL — ABNORMAL HIGH (ref 70–99)
Glucose-Capillary: 159 mg/dL — ABNORMAL HIGH (ref 70–99)
Glucose-Capillary: 165 mg/dL — ABNORMAL HIGH (ref 70–99)
Glucose-Capillary: 182 mg/dL — ABNORMAL HIGH (ref 70–99)

## 2021-03-24 LAB — MAGNESIUM: Magnesium: 2.3 mg/dL (ref 1.7–2.4)

## 2021-03-24 LAB — PHOSPHORUS: Phosphorus: 4.5 mg/dL (ref 2.5–4.6)

## 2021-03-24 LAB — TRIGLYCERIDES: Triglycerides: 198 mg/dL — ABNORMAL HIGH (ref <150)

## 2021-03-24 MED ORDER — INSULIN REGULAR HUMAN 100 UNIT/ML IJ SOLN
INTRAMUSCULAR | Status: AC
  Filled 2021-03-24: qty 1020

## 2021-03-24 NOTE — Progress Notes (Signed)
Inpatient Rehabilitation Admissions Coordinator   Inpatient rehab consult received. I met with patient and his wife at bedside. We discussed goal and expectations of a possible [facility] admit pending his progress with therapy and medically. Noted on TNA and Cardizem . I will follow his progress.   Danne Baxter, RN, MSN Rehab Admissions Coordinator [phone] 03/24/2021 11:30 AM

## 2021-03-24 NOTE — Progress Notes (Signed)
ANTICOAGULATION CONSULT NOTE - Follow Up Consult  Pharmacy Consult for Lovenox Indication: atrial fibrillation  Allergies  Allergen Reactions   Atorvastatin Other (See Comments)    myalgias    Patient Measurements: Height: 6' (182.9 cm) Weight: 98.9 kg (218 lb 0.6 oz) IBW/kg (Calculated) : 77.6  Vital Signs: Temp: 97.5 F (36.4 C) (10/06 0732) Temp Source: Axillary (10/06 0732) BP: 138/50 (10/06 0732) Pulse Rate: 60 (10/06 0732)  Labs: Recent Labs    03/23/21 0437 03/24/21 0439  HGB 7.4*  --   HCT 23.3*  --   PLT 281  --   CREATININE 1.01 1.19     Estimated Creatinine Clearance: 70.3 mL/min (by C-G formula based on SCr of 1.19 mg/dL).   Assessment: 70 y/o male admitted for sepsis from contained perforated diverticulitis and intra-abdominal abscess with active ileus. On PTA Xarelto for Afib with last dose on 9/2 at 2000.  Pharmacy consulted for Lovenox due to post operative ileus.   CT 9/27 with continued complex fluid collection along left psoas. Per IR note no aspiration/drainage recommended. Patient is still NPO and cannot transition to an oral agent at this time. Missed 10/1 AM enoxaparin dose. Since he missed his dose, he has recent postoperative anemia requiring PRBC, and his level is technically therapeutic, keep at current dose and reassess anti-xa level if needed.  10/2: peak Anti-xa = 0.61 (~3h after AM dose) and therapeutic.  No bleeding noted, Hgb down to 7.4 on 10/5, platelets are stable.  Goal of Therapy:  Anti-Xa level Goal:  0.6-1 units/mL 4 hours post dose Monitor platelets by anticoagulation protocol: Yes   Plan:  Lovenox 100 mg SQ q12h Reassess anti-xa level as needed CBC at least q72h while on Lovenox F/U ability to transition to oral agent   Thank you for involving pharmacy in this patient's care.  Renold Genta, PharmD, BCPS Clinical Pharmacist Clinical phone for 03/24/2021 until 3p is [phone] 03/24/2021 9:08 AM  **Pharmacist phone  directory can be found on [email or web address] listed under [place]**

## 2021-03-24 NOTE — Plan of Care (Signed)
  Problem: Health Behavior/Discharge Planning: Goal: Ability to manage health-related needs will improve Outcome: Progressing   Problem: Clinical Measurements: Goal: Ability to maintain clinical measurements within normal limits will improve Outcome: Progressing   Problem: Activity: Goal: Risk for activity intolerance will decrease Outcome: Progressing   Problem: Nutrition: Goal: Adequate nutrition will be maintained Outcome: Progressing   

## 2021-03-24 NOTE — Progress Notes (Signed)
Right upper extremity venous duplex has been completed. Preliminary results can be found in CV Proc through chart review.   03/24/21 4:13 PM Carlos Levering RVT

## 2021-03-24 NOTE — Progress Notes (Signed)
PHARMACY - TOTAL PARENTERAL NUTRITION CONSULT NOTE  Indication: Prolonged ileus  Patient Measurements: Height: 6' (182.9 cm) Weight: 98.9 kg (218 lb 0.6 oz) IBW/kg (Calculated) : 77.6 TPN AdjBW (KG): 82.6 Body mass index is 29.57 kg/m.  Assessment:  57 YOM with lower abdominal pain/N/V found to have perforated diverticulitis. CT abd on 9/3 found to have active ileus/pSBO from diverticulitis/bowel inflammation. Pharmacy consulted to manage TPN for prolonged ileus. Of note patient has a history pf alcohol dependency and is at high risk for refeeding.   Glucose / Insulin: no hx DM, A1c 5.9% - CBGs 179-193/24hr. Used 17 units SSI in past 24 hrs, 25 units insulin in TPN. Patient started on methylprednisone 40mg  q12hr 10/3-10/5 leading to increased CBGs acutely.   Electrolytes: K 3.7 (goal >/= 4 with ileus), Ca 8.3 (albumin 1.7)= CoCa 10.14 , Phos up to 4.5, Mg 2.3.  Renal: SCr 1.19, BUN 44 up.  Hepatic: LFTs / tbili  WNL, TG 68 (9/26)>>186>198, albumin 1.7  Intake / Output; MIVF: NG 0 recorded clamping trials ongoing (NG tube fell out 10/5 PM), Colostomy output 0 recorded but stool noted by surgery and WOC, UOP 1543ml = 0.41ml/kg/hr,  - lasix increased to 40mg  IV daily   GI Meds: IV Reglan/6hr, PPI IV/12h  ID: [email or web address] through 04/21/14 for complicated diverticulitis with IA abscess  GI Imaging:  - 9/3 CT abd: Active ileus/PSBO - 9/16 CT abd: L-iliopsoas fluid collection (abscess or hematoma), resolution of prior diverticular abscess, still w/ evidence of ileus - 9/27 CT abd: continued retroperitoneal complex fluid collection, SBO - 10/4: CT: Left iliopsoas enlargement heterogeneity persists, with fluid fluid level is no longer identified. Findings may represent intramuscular hematoma, infection or mass.Prominent proximal small bowel without definite transition point favored as ileus. Degree of small-bowel distention has decreased. Partial small bowel obstruction cannot be entirely  excluded.  GI Surgeries / Procedures:  - 9/10 ex-lap, sigmoid colectomy w/ end colostomy creation, enterorrhaphy, drainage of intra-abd abscess, wound vac placement  Central access: PICC line 02/24/21 TPN start date: 02/25/21  Nutritional Goals, RD Estimated Needs Total Energy Estimated Needs: 2000-2200 Total Protein Estimated Needs: 100-110 grams Total Fluid Estimated Needs: >2L  Current Nutrition:  TPN CLD started 9/23 > NPO on 9/27 with NG to LIWS  ok to have sips of clears and ice chips   Plan:  Continue TPN at goal rate 85 ml/hr to provide 102g AA, 367g CHO and 45g ILE for a total of 2104 kCal, meeting 100% of patient's needs Electrolytes in TPN: Na 150,  incr K 65 mEq/L, Ca 2.20mEq/L, Mg 30mEq/L, Phos 71mmol/L. Cl:Ac 1:2  Monitor K closely while diuresis is ongoing. May need to reduce in bag when no longer diuresing. Con't to closely monitor rising triglycerides. Next check Monday. Add standard MVI, trace elements, folate and thiamine to TPN Continue moderate SSI Q4H and increase to 30 units regular insulin in TPN (now off Solumedrol)  Standard TPN labs on Mon and Thurs and prn   Jenisa Monty S. Alford Highland, PharmD, BCPS Clinical Staff Pharmacist [email or web address]

## 2021-03-24 NOTE — Progress Notes (Signed)
Notified by IV team that right arm is quite swollen and she is concerned for clot or cellulitis. Asked to notify the attending MD

## 2021-03-24 NOTE — Progress Notes (Signed)
PROGRESS NOTE    HARDEEP REETZ  XLK:[id number] DOB: Mar 26, 1951 DOA: 02/19/2021 PCP: [name], MD    Brief Narrative:  Mr. Rubinstein was admitted to the hospital with the working diagnosis of severe sepsis due to perforated diverticulitis/ intra-abdominal abscess. Now sp lapartomy, with colectomy, complicated with postoperative ileus.   70 year old male past medical history for atrial fibrillation, dyslipidemia, hypertension and diverticulosis who presented with nausea, vomiting and abdominal pain.  Reported several days of worse abdominal pain, and poor oral intake.  Because of severe symptoms he presented to the hospital.  On his initial physical examination his blood pressure was 84/65, heart rate 57, respiratory rate 20, oxygen saturation 91%, he was ill-looking appearing, heart S1-S2, present, irregular irregular, lungs clear to auscultation, abdomen tender to palpation in the mid abdomen, no lower extremity edema.   CT of the abdomen and pelvis with acute sigmoid diverticulitis with small extraluminal gas and fluid collection in the sigmoid mesocolon, 3.5 x 1.5 x 2.7 cm consistent with perforation and abscess.  Dilated proximal and decompressed distal small bowel loops, possible ileus.   9/4 developed atrial fibrillation with rapid ventricular response, required intravenous amiodarone infusion.   Follow-up CT 9/7 with increased size pelvic abscess. On 9/8 patient underwent CT-guided drainage of pelvic abscess.   09/10 Patient underwent exploratory laparotomy with sigmoid colectomy and diverting colostomy placement.  Postoperative ileus.   Urology and ID have been consulted for epididymitis.   9/19 Postoperative anemia: Required 1 unit packed red blood cells.    Positive left knee pain, positive effusion, underwent left knee aspiration and injection with good toleration.  Fluid with no crystals, gram stain no organisms.   Slowly resolving ileus. Patient had NG tube clamped but  patient vomiting, needing to reconnect tube to suction.     09/26 NG tube has been removed.   09/27 patient with vomiting and abdominal distention, abdominal radiographs with recurrent ileus, NG tube has replaced and connected to low intermittent suction.    Assessment & Plan:   Principal Problem:   Diverticulitis of large intestine with perforation and abscess without bleeding Active Problems:   Diverticulitis of intestine with abscess   Atrial fibrillation, chronic (HCC)   Essential hypertension   Diverticulitis   AKI (acute kidney injury) ([place])   Severe sepsis with acute organ dysfunction (HCC)   Intra-abdominal abscess (HCC)   Epididymitis   Severe sepsis due to perforated diverticulitis, sp colectomy. Left psoas collection. Post operative ileus.  Patient with persistent/ recurrent ileus, NG tube was replaced 03/14/2021.  Ileus managed by general surgery.  CT 03/15/2021 shows continued complex fluid collection along left iliopsoas.  SBO with transition in mid jejunum.  Surgery consulted IR to evaluate for drainage of the left psoas abscess however, IR do not recommend aspirating at this point in time.  No pain today.  S/p Gastrografin study on 03/16/2021.  Patient being managed by intermittent NG tube clamping.  Remains on TPN.  Management per surgery.  Patient remains on Zosyn with end date of 03/25/2021 as recommended by ID.  ID had documented in their latest note to notify them if patient happens to be here through 03/24/2021.  ID consulted ID on 03/22/2021. CT 10/4 with ileus although improved distention of small bowel, stable psoas collection.  Patient's NG tube fell out yesterday.  He has not had any return of the symptoms such as nausea.  Tolerating ice chips.  Started on a clear liquid diet by general surgery today.  Awaiting further  recommendations from ID.   2. Post operative anemia. sp 1 unit PRBC on 03/07/2021. Hemoglobin remains stable.  Monitor closely   3. Paroxysmal atrial  fibrillation/ HTN. Patient was on amiodarone and metoprolol with good rate control. On enoxaparin for anticoagulation, holding DOAC until po intake more consistent.  Due to recurrent ileus, on 03/16/2021, general surgery recommended transitioning all oral or enteral medications to IV route.  Amiodarone and metoprolol was stopped and he was started on IV metoprolol 10 mg daily 8 hours.  Patient's rates are controlled around 100 and he is only on 5 mg of Cardizem.  Due to significant wheezes, IV metoprolol was also discontinued on 03/21/2021.  Rates are still under control.   4. Right epididymitis resolved, completed therapy with doxycycline.    5. AKI with hypokalemia/ hyperkalemia,hyponatremia and hypophosphatemia.  Continue TPN per pharmacy protocol.  All electrolytes normal with AKI resolved.   6. Alcohol withdrawal. Resolved.    7. Left knee arthritis. Clinically improved after steroid injection.  Pain is well controlled.   8.  Essential hypertension: Controlled.  Amlodipine discontinued due to recurrent ileus and surgery recommendations to withhold oral medications.  9.  Dyspnea/atelectasis acute hypoxic respiratory failure: Continues to improve and feels much better.  No more wheezes.  Off of oxygen.  Solu-Medrol discontinued yesterday after 2 days.  Continue as needed DuoNeb.  Continue to hold metoprolol.  Will reconsider resuming in next 1 to 2 days with lower dose, likely orally if he continues to do well with p.o. intake.  10.  Anemia of chronic disease: Hemoglobin trending down and currently 7.4.  Yesterday.  Checking CBC again today.  Will need transfusion if drops less than 7.  11.  Right upper extremity edema: He complains of some pain and he has obvious significant edema which is pitting +2 on the right upper extremity.  Doubt DVT since he has been on full dose enoxaparin however will pursue Doppler upper extremity to rule out DVT.  Primary RN advised to keep his right upper extremity  elevated.  Status is: Inpatient  Remains inpatient appropriate because:Inpatient level of care appropriate due to severity of illness  Dispo: The patient is from: Home              Anticipated d/c is to: [facility]              Patient currently is not medically stable to d/c.  Will be ready when cleared by general surgery   Difficult to place patient No   DVT prophylaxis: Enoxxaparin   Code Status:    full  Family Communication:  Plan discussed with his wife at bedside.    Nutrition Status: Nutrition Problem: Inadequate oral intake Etiology: acute illness (diverticulitis) Signs/Symptoms: per patient/family report, NPO status Interventions: Prostat, Boost Plus    Consultants:  Surgery   Subjective: Patient seen and examined.  He has no complaints other than mild abdominal pain which is improving.  Fully alert and oriented.  Objective: Vitals:   03/23/21 2339 03/24/21 0400 03/24/21 0732 03/24/21 1219  BP: (!) 119/57 (!) 106/51 (!) 138/50 128/67  Pulse: 88 88 60 83  Resp: (!) 23 20 20  (!) 25  Temp: 97.6 F (36.4 C) 97.9 F (36.6 C) (!) 97.5 F (36.4 C) 97.9 F (36.6 C)  TempSrc: Oral Oral Axillary Oral  SpO2: 92% 95% 97% 94%  Weight:      Height:        Intake/Output Summary (Last 24 hours) at 03/24/2021 1236  Last data filed at 03/24/2021 1200 Gross per 24 hour  Intake 2511.65 ml  Output 4050 ml  Net -1538.35 ml    Filed Weights   03/07/21 0342 03/10/21 0447 03/16/21 0343  Weight: 94 kg 102 kg 98.9 kg    Examination:  General exam: Appears calm and comfortable  Respiratory system: Clear to auscultation. Respiratory effort normal. Cardiovascular system: S1 & S2 heard, RRR. No JVD, murmurs, rubs, gallops or clicks. No pedal edema. Gastrointestinal system: Abdomen is nondistended, soft and mild generalized tenderness. No organomegaly or masses felt. Normal bowel sounds heard. Central nervous system: Alert and oriented. No focal neurological deficits. Extremities:  Symmetric 5 x 5 power. Skin: No rashes, lesions or ulcers.  Psychiatry: Judgement and insight appear poor  Data Reviewed: I have personally reviewed following labs and imaging studies  CBC: Recent Labs  Lab 03/20/21 0439 03/23/21 0437  WBC 11.7* 9.2  HGB 8.1* 7.4*  HCT 26.0* 23.3*  MCV 101.2* 97.5  PLT 198 062    Basic Metabolic Panel: Recent Labs  Lab 03/19/21 0244 03/20/21 0439 03/21/21 0406 03/23/21 0437 03/24/21 0439  NA 141 142 139 135 138  K 4.3 3.7 3.8 3.7 3.7  CL 109 108 105 99 100  CO2 26 26 27 28 28   GLUCOSE 125* 144* 245* 203* 177*  BUN 25* 28* 33* 42* 44*  CREATININE 1.17 1.14 1.07 1.01 1.19  CALCIUM 7.9* 7.8* 7.7* 7.8* 8.3*  MG 2.2 2.2 2.3  --  2.3  PHOS 3.2  --  3.3  --  4.5    GFR: Estimated Creatinine Clearance: 70.3 mL/min (by C-G formula based on SCr of 1.19 mg/dL). Liver Function Tests: Recent Labs  Lab 03/21/21 0406 03/24/21 0439  AST 27 33  ALT 31 47*  ALKPHOS 75 88  BILITOT 0.5 0.6  PROT 5.6* 6.1*  ALBUMIN <1.5* 1.7*    No results for input(s): LIPASE, AMYLASE in the last 168 hours. No results for input(s): AMMONIA in the last 168 hours. Coagulation Profile: No results for input(s): INR, PROTIME in the last 168 hours. Cardiac Enzymes: No results for input(s): CKTOTAL, CKMB, CKMBINDEX, TROPONINI in the last 168 hours. BNP (last 3 results) No results for input(s): PROBNP in the last 8760 hours. HbA1C: No results for input(s): HGBA1C in the last 72 hours. CBG: Recent Labs  Lab 03/23/21 1926 03/23/21 2315 03/24/21 0409 03/24/21 0735 03/24/21 1222  GLUCAP 186* 179* 182* 165* 159*    Lipid Profile: Recent Labs    03/24/21 0439  TRIG 198*    Thyroid Function Tests: No results for input(s): TSH, T4TOTAL, FREET4, T3FREE, THYROIDAB in the last 72 hours. Anemia Panel: No results for input(s): VITAMINB12, FOLATE, FERRITIN, TIBC, IRON, RETICCTPCT in the last 72 hours.   Radiology Studies: I have reviewed all of the  imaging during this hospital visit personally  Scheduled Meds:  Chlorhexidine Gluconate Cloth  6 each Topical Daily   enoxaparin (LOVENOX) injection  100 mg Subcutaneous Q12H   furosemide  40 mg Intravenous Daily   insulin aspart  0-15 Units Subcutaneous Q4H   ketorolac  15 mg Intravenous Q6H   lidocaine  1 patch Transdermal Q24H   lidocaine  1 patch Transdermal Q24H   metoCLOPramide (REGLAN) injection  10 mg Intravenous Q6H   nicotine  21 mg Transdermal Daily   pantoprazole (PROTONIX) IV  40 mg Intravenous Q12H   sodium chloride flush  10 mL Intracatheter Q12H   Continuous Infusions:  sodium chloride 10 mL/hr at 03/20/21  1505   diltiazem (CARDIZEM) infusion 10 mg/hr (03/24/21 0948)   methocarbamol (ROBAXIN) IV 1,000 mg (03/24/21 0501)   piperacillin-tazobactam (ZOSYN)  IV 3.375 g (03/24/21 0830)   TPN ADULT (ION) 85 mL/hr at 03/23/21 1751   TPN ADULT (ION)       LOS: 32 days   Total time spent in minutes: [place], MD  [place]

## 2021-03-24 NOTE — Progress Notes (Signed)
Progress Note  26 Days Post-Op  Subjective: NGT fell out while patient was getting up to the chair yesterday. He denies nausea or vomiting since this. He has continued to take in ice chips and sips of clears and appears to be tolerating this. He has already been up to the chair this AM.   Objective: Vital signs in last 24 hours: Temp:  [97.2 F (36.2 C)-97.9 F (36.6 C)] 97.5 F (36.4 C) (10/06 0732) Pulse Rate:  [60-91] 60 (10/06 0732) Resp:  [16-23] 20 (10/06 0732) BP: (106-147)/(50-75) 138/50 (10/06 0732) SpO2:  [92 %-97 %] 97 % (10/06 0732) Last BM Date: 03/20/21  Intake/Output from previous day: 10/05 0701 - 10/06 0700 In: 2511.7 [I.V.:2051.1; IV Piggyback:460.6] Out: 1740 [Urine:1550] Intake/Output this shift: Total I/O In: -  Out: 900 [Urine:900]  PE: Gen:  Alert, NAD, pleasant Pulm:  Normal rate and effort.  Abd: Soft, ND, NT, +BS. Stoma appears viable. Dark mucus like stool present in ostomy appliance. midline wound with VAC present   Lab Results:  Recent Labs    03/23/21 0437  WBC 9.2  HGB 7.4*  HCT 23.3*  PLT 281   BMET Recent Labs    03/23/21 0437 03/24/21 0439  NA 135 138  K 3.7 3.7  CL 99 100  CO2 28 28  GLUCOSE 203* 177*  BUN 42* 44*  CREATININE 1.01 1.19  CALCIUM 7.8* 8.3*   PT/INR No results for input(s): LABPROT, INR in the last 72 hours. CMP     Component Value Date/Time   NA 138 03/24/2021 0439   NA 134 09/30/2019 0959   NA 142 03/03/2016 0921   K 3.7 03/24/2021 0439   K 4.3 03/03/2016 0921   CL 100 03/24/2021 0439   CO2 28 03/24/2021 0439   CO2 25 03/03/2016 0921   GLUCOSE 177 (H) 03/24/2021 0439   GLUCOSE 89 03/03/2016 0921   BUN 44 (H) 03/24/2021 0439   BUN 11 09/30/2019 0959   BUN 13.6 03/03/2016 0921   CREATININE 1.19 03/24/2021 0439   CREATININE 1.2 03/03/2016 0921   CALCIUM 8.3 (L) 03/24/2021 0439   CALCIUM 9.6 03/03/2016 0921   PROT 6.1 (L) 03/24/2021 0439   PROT 7.3 03/03/2016 0921   ALBUMIN 1.7 (L)  03/24/2021 0439   ALBUMIN 3.8 03/03/2016 0921   AST 33 03/24/2021 0439   AST 36 (H) 03/03/2016 0921   ALT 47 (H) 03/24/2021 0439   ALT 31 03/03/2016 0921   ALKPHOS 88 03/24/2021 0439   ALKPHOS 116 03/03/2016 0921   BILITOT 0.6 03/24/2021 0439   BILITOT 0.71 03/03/2016 0921   GFRNONAA >60 03/24/2021 0439   GFRNONAA 67 07/25/2013 1712   GFRAA >60 10/03/2019 0930   GFRAA 77 07/25/2013 1712   Lipase     Component Value Date/Time   LIPASE 22 02/19/2021 1653       Studies/Results: CT ABDOMEN PELVIS W CONTRAST  Result Date: 03/22/2021 CLINICAL DATA:  Abdominal infection suspected. EXAM: CT ABDOMEN AND PELVIS WITH CONTRAST TECHNIQUE: Multidetector CT imaging of the abdomen and pelvis was performed using the standard protocol following bolus administration of intravenous contrast. CONTRAST:  157mL OMNIPAQUE IOHEXOL 350 MG/ML SOLN COMPARISON:  CT abdomen and pelvis 03/04/2021. FINDINGS: Lower chest: Moderate right and small left pleural effusions are present, increased from prior. There is compressive atelectasis in the bilateral lower lobes. Hepatobiliary: No focal liver abnormality is seen. No gallstones, gallbladder wall thickening, or biliary dilatation. Pancreas: Unremarkable. No pancreatic ductal dilatation or surrounding inflammatory  changes. Spleen: Normal in size without focal abnormality. Adrenals/Urinary Tract: Subcentimeter left adrenal nodule is unchanged. Right adrenal gland within normal limits. There is mild nonspecific bilateral perinephric fat stranding which appears similar to the prior examination. There is no hydronephrosis. Bladder is within normal limits. Stomach/Bowel: Tip of nasogastric tube is in the stomach. Air contrast level is noted in the stomach. There is some distended small bowel loops proximally measuring up to 4 cm. These have decreased in size when compared to the prior examination. Ileal loops are decompressed. No definite transition point visualized. Colon is  nondilated. There is colonic diverticulosis without evidence for diverticulitis. Left lower quadrant colostomy appears within normal limits. The appendix is within normal limits. There is also some contrast within rectal stump without extravasation. Vascular/Lymphatic: Aortic atherosclerosis. No enlarged abdominal or pelvic lymph nodes. Reproductive: Prostate gland is enlarged, unchanged from prior examination. Other: Left ileus psoas heterogeneity and enlargement is again seen. Size is unchanged, but there is no longer fluid fluid level identified. Underlying lesion would be could difficult to exclude. There is no ascites or free air. No pelvic fluid collection identified. Percutaneous drainage catheter has been removed. There is no focal abdominal wall hernia. Diffuse body wall edema is unchanged. Musculoskeletal: Lipoma in the anterior right thigh musculature is unchanged. Degenerative changes affect the spine. IMPRESSION: 1. Left iliopsoas enlargement heterogeneity persists, with fluid fluid level is no longer identified. Findings may represent intramuscular hematoma, infection or mass. 2. Prominent proximal small bowel without definite transition point favored as ileus. Degree of small-bowel distention has decreased. Partial small bowel obstruction cannot be entirely excluded. 3. No pelvic fluid collection. Percutaneous drainage catheter has been removed. 4. Left lower quadrant colostomy appears uncomplicated. 5. Bilateral pleural effusions have increased. 6. Stable body wall edema. 7.  Aortic Atherosclerosis (ICD10-I70.0). Electronically Signed   By: Ronney Asters M.D.   On: 03/22/2021 20:13    Anti-infectives: Anti-infectives (From admission, onward)    Start     Dose/Rate Route Frequency Ordered Stop   03/09/21 0800  piperacillin-tazobactam (ZOSYN) IVPB 3.375 g        3.375 g 12.5 mL/hr over 240 Minutes Intravenous Every 8 hours 03/09/21 0706 03/25/21 2359   02/26/21 0100  doxycycline (VIBRAMYCIN)  100 mg in sodium chloride 0.9 % 250 mL IVPB        100 mg 125 mL/hr over 120 Minutes Intravenous 2 times daily 02/26/21 0005 03/08/21 2359   02/20/21 0300  piperacillin-tazobactam (ZOSYN) IVPB 3.375 g        3.375 g 12.5 mL/hr over 240 Minutes Intravenous Every 8 hours 02/19/21 1907 03/08/21 2359   02/19/21 1900  ceFEPIme (MAXIPIME) 2 g in sodium chloride 0.9 % 100 mL IVPB  Status:  Discontinued        2 g 200 mL/hr over 30 Minutes Intravenous  Once 02/19/21 1857 02/19/21 1906   02/19/21 1900  metroNIDAZOLE (FLAGYL) IVPB 500 mg  Status:  Discontinued        500 mg 100 mL/hr over 60 Minutes Intravenous  Once 02/19/21 1857 02/19/21 1906   02/19/21 1815  piperacillin-tazobactam (ZOSYN) IVPB 3.375 g        3.375 g 100 mL/hr over 30 Minutes Intravenous  Once 02/19/21 1809 02/19/21 1944        Assessment/Plan POD 26 s/p ex lap, sigmoid colectomy, end colostomy with enterorrhaphy and drainage of intraabdominal abscess - Dr. Kieth Brightly and Dr. Radene Knee 9/10 for Acute sigmoid diverticulitis with contained perforation and abscess - CT 9/16  demonstrates heterogenous left iliopsoas collection likely hematoma, stranding in the retroperitoneum and subcu tissue.  Distal colon appears decompressed and very small caliber but there is contrast entering this area; there is additionally a similarly small-caliber segment of colon in the proximal transverse and the ascending colon is not dilated- suspect over all this represents small bowel/gastric ileus.  IR does not recommend draining the hematoma at this point. ID following and recommending Zosyn through 10/17  - NGT replaced 9/27  - CT 9/27 with continued complex fluid collection along left psoas, SBO with transition in mid jejunum, post-surgical changes of colon resection with colostomy - Was started on reglan last weekend. Discussed with him the risk of tardive dyskinesia/EPS on 9/20. Patient stated understanding and is okay with continuing Reglan.  -  Gastrograffin study 9/28 showed Stable gaseous distention of the small bowel, oral contrast progressed into the colon - CT 10/4 with ileus although improved distention of small bowel, stable psoas collection - NGT fell out 10/5 and patient has tolerated it being out on ice chips/sips - will try CLD today  - WOCN following for new ostomy teaching and vac changes - continue VAC through the weekend, but wound is much shallower and likely will DC vac next week - Cont TPN - Encouraged ambulation (working with therapies), use IS, multimodal pain control  - Cont abx per ID recs   FEN - CLD, TPN VTE - SCDs, therapeutic lovenox  ID - Zosyn 9/4 - 10/17 (per ID) Foley - out, voiding.    Per primary: A fib - cards following  AKI - resolved ABL anemia -  stable HTN ETOH use L knee pain - per ortho  LOS: 32 days    Norm Parcel, [facility] 03/24/2021, 9:09 AM Please see Amion for pager number during day hours 7:00am-4:30pm

## 2021-03-25 ENCOUNTER — Inpatient Hospital Stay (HOSPITAL_COMMUNITY): Payer: Medicare Other

## 2021-03-25 DIAGNOSIS — K567 Ileus, unspecified: Secondary | ICD-10-CM | POA: Diagnosis not present

## 2021-03-25 DIAGNOSIS — I482 Chronic atrial fibrillation, unspecified: Secondary | ICD-10-CM | POA: Diagnosis not present

## 2021-03-25 DIAGNOSIS — K5720 Diverticulitis of large intestine with perforation and abscess without bleeding: Secondary | ICD-10-CM | POA: Diagnosis not present

## 2021-03-25 LAB — CBC
HCT: 27.1 % — ABNORMAL LOW (ref 39.0–52.0)
Hemoglobin: 8.4 g/dL — ABNORMAL LOW (ref 13.0–17.0)
MCH: 30.8 pg (ref 26.0–34.0)
MCHC: 31 g/dL (ref 30.0–36.0)
MCV: 99.3 fL (ref 80.0–100.0)
Platelets: 371 10*3/uL (ref 150–400)
RBC: 2.73 MIL/uL — ABNORMAL LOW (ref 4.22–5.81)
RDW: 15.8 % — ABNORMAL HIGH (ref 11.5–15.5)
WBC: 8.9 10*3/uL (ref 4.0–10.5)
nRBC: 0.4 % — ABNORMAL HIGH (ref 0.0–0.2)

## 2021-03-25 LAB — BASIC METABOLIC PANEL
Anion gap: 5 (ref 5–15)
BUN: 35 mg/dL — ABNORMAL HIGH (ref 8–23)
CO2: 29 mmol/L (ref 22–32)
Calcium: 7.9 mg/dL — ABNORMAL LOW (ref 8.9–10.3)
Chloride: 102 mmol/L (ref 98–111)
Creatinine, Ser: 1.02 mg/dL (ref 0.61–1.24)
GFR, Estimated: >60 mL/min (ref >60)
Glucose, Bld: 114 mg/dL — ABNORMAL HIGH (ref 70–99)
Potassium: 4.1 mmol/L (ref 3.5–5.1)
Sodium: 136 mmol/L (ref 135–145)

## 2021-03-25 LAB — GLUCOSE, CAPILLARY
Glucose-Capillary: 125 mg/dL — ABNORMAL HIGH (ref 70–99)
Glucose-Capillary: 134 mg/dL — ABNORMAL HIGH (ref 70–99)
Glucose-Capillary: 140 mg/dL — ABNORMAL HIGH (ref 70–99)
Glucose-Capillary: 133 mg/dL — ABNORMAL HIGH (ref 70–99)
Glucose-Capillary: 175 mg/dL — ABNORMAL HIGH (ref 70–99)
Glucose-Capillary: 150 mg/dL — ABNORMAL HIGH (ref 70–99)

## 2021-03-25 MED ORDER — FUROSEMIDE 10 MG/ML IJ SOLN
40.0000 mg | Freq: Two times a day (BID) | INTRAMUSCULAR | Status: DC
Start: 2021-03-25 — End: 2021-03-28
  Administered 2021-03-25 – 2021-03-27 (×5): 40 mg via INTRAVENOUS
  Filled 2021-03-25 (×5): qty 4

## 2021-03-25 MED ORDER — BOOST / RESOURCE BREEZE PO LIQD CUSTOM
1.0000 | Freq: Three times a day (TID) | ORAL | Status: DC
  Administered 2021-03-25 (×2): 1 via ORAL

## 2021-03-25 MED ORDER — POTASSIUM CHLORIDE 20 MEQ PO PACK
20.0000 meq | PACK | Freq: Once | ORAL | Status: AC
  Administered 2021-03-25: 20 meq via ORAL
  Filled 2021-03-25: qty 1

## 2021-03-25 MED ORDER — TRAVASOL 10 % IV SOLN
INTRAVENOUS | Status: AC
  Filled 2021-03-25: qty 1020

## 2021-03-25 MED ORDER — DILTIAZEM 12 MG/ML ORAL SUSPENSION
30.0000 mg | Freq: Four times a day (QID) | ORAL | Status: DC
  Administered 2021-03-25 – 2021-03-28 (×12): 30 mg via ORAL
  Filled 2021-03-25 (×13): qty 3

## 2021-03-25 MED ORDER — PROSOURCE PLUS PO LIQD
30.0000 mL | Freq: Two times a day (BID) | ORAL | Status: DC
  Administered 2021-03-25 – 2021-03-28 (×7): 30 mL via ORAL
  Filled 2021-03-25 (×7): qty 30

## 2021-03-25 MED ORDER — LORAZEPAM 2 MG/ML IJ SOLN
1.0000 mg | Freq: Once | INTRAMUSCULAR | Status: AC
  Administered 2021-03-25: 1 mg via INTRAVENOUS
  Filled 2021-03-25: qty 1

## 2021-03-25 NOTE — Progress Notes (Signed)
PHARMACY - TOTAL PARENTERAL NUTRITION CONSULT NOTE  Indication: Prolonged ileus  Patient Measurements: Height: 6' (182.9 cm) Weight: 98.9 kg (218 lb 0.6 oz) IBW/kg (Calculated) : 77.6 TPN AdjBW (KG): 82.6 Body mass index is 29.57 kg/m.  Assessment:  50 YOM with lower abdominal pain/N/V found to have perforated diverticulitis. CT abd on 9/3 found to have active ileus/pSBO from diverticulitis/bowel inflammation. Pharmacy consulted to manage TPN for prolonged ileus. Of note patient has a history pf alcohol dependency and is at high risk for refeeding.   Glucose / Insulin: no hx DM, A1c 5.9% - CBGs 125-165 improved with Solumedrol off. Used 14 units SSI in past 24 hrs, 30 units insulin in TPN.   Electrolytes: K 3.7 (goal >/= 4 with ileus), Ca 8.3 (albumin 1.7)= CoCa 10.14 , Phos up to 4.5, Mg 2.3. - Labs not yet drawn today. Check tomorrow.  Renal: SCr 1.19, BUN 44 up.  Hepatic: LFTs / tbili  WNL, TG 68 (9/26)>>186>198, albumin 1.7  Intake / Output; MIVF: Unmeasured stool x 1 10/6, UOP 4372ml = 1.8 ml/kg/hr,  - CLD: 0 recorded. - lasix 40mg  IV daily   GI Meds: IV Reglan/6hr, PPI IV/12h  ID: Zosyn through 39/76/73 for complicated diverticulitis with IA abscess  GI Imaging:  - 9/3 CT abd: Active ileus/PSBO - 9/16 CT abd: L-iliopsoas fluid collection (abscess or hematoma), resolution of prior diverticular abscess, still w/ evidence of ileus - 9/27 CT abd: continued retroperitoneal complex fluid collection, SBO - 10/4: CT: Left iliopsoas enlargement heterogeneity persists, with fluid fluid level is no longer identified. Findings may represent intramuscular hematoma, infection or mass.Prominent proximal small bowel without definite transition point favored as ileus. Degree of small-bowel distention has decreased. Partial small bowel obstruction cannot be entirely excluded.  GI Surgeries / Procedures:  - 9/10 ex-lap, sigmoid colectomy w/ end colostomy creation, enterorrhaphy, drainage of  intra-abd abscess, wound vac placement  Central access: PICC line 02/24/21 TPN start date: 02/25/21  Nutritional Goals, RD Estimated Needs Total Energy Estimated Needs: 2000-2200 Total Protein Estimated Needs: 100-110 grams Total Fluid Estimated Needs: >2L  Current Nutrition:  TPN CLD 10/6 ok to have sips of clears and ice chips   Plan:  No change to TPN formula today (no labs back) Continue TPN at goal rate 85 ml/hr to provide 102g AA, 367g CHO and 45g ILE for a total of 2104 kCal, meeting 100% of patient's needs Electrolytes in TPN: Na 150,  K 65 mEq/L, Ca 2.79mEq/L, Mg 50mEq/L, Phos 53mmol/L. Cl:Ac 1:2  Monitor K closely while diuresis is ongoing. May need to reduce in bag when no longer diuresing. Con't to closely monitor rising triglycerides. Next check Monday. Add standard MVI, trace elements, folate and thiamine to TPN Continue moderate SSI Q4H and increase to 30 units regular insulin in TPN (now off Solumedrol)  Standard TPN labs on Mon and Thurs and prn   Kymberlyn Eckford S. Alford [place], PharmD, BCPS Clinical Staff Pharmacist [email or web address]

## 2021-03-25 NOTE — Progress Notes (Signed)
Patient rang call bell complaining of SHOB. 02 saturations are 94% on RA. 2Lpm nasal cannula applied for comfort. Lungs are diminished on left side. Respiratory therapy called to evaluate patient for breathing treatment.

## 2021-03-25 NOTE — Progress Notes (Signed)
PROGRESS NOTE    JOSIEL GAHM  JYN:[id number] DOB: 1951/05/02 DOA: 02/19/2021 PCP: [name], MD    Brief Narrative:   Mr. Brigance was admitted to the hospital with the working diagnosis of severe sepsis due to perforated diverticulitis/ intra-abdominal abscess. Now sp lapartomy, with colectomy, complicated with postoperative ileus.   70 year old male past medical history for atrial fibrillation, dyslipidemia, hypertension and diverticulosis who presented with nausea, vomiting and abdominal pain.  Reported several days of worse abdominal pain, and poor oral intake.  Because of severe symptoms he presented to the hospital.  On his initial physical examination his blood pressure was 84/65, heart rate 57, respiratory rate 20, oxygen saturation 91%, he was ill-looking appearing, heart S1-S2, present, irregular irregular, lungs clear to auscultation, abdomen tender to palpation in the mid abdomen, no lower extremity edema.   CT of the abdomen and pelvis with acute sigmoid diverticulitis with small extraluminal gas and fluid collection in the sigmoid mesocolon, 3.5 x 1.5 x 2.7 cm consistent with perforation and abscess.  Dilated proximal and decompressed distal small bowel loops, possible ileus.   9/4 developed atrial fibrillation with rapid ventricular response, required intravenous amiodarone infusion.   Follow-up CT 9/7 with increased size pelvic abscess. On 9/8 patient underwent CT-guided drainage of pelvic abscess.   09/10 Patient underwent exploratory laparotomy with sigmoid colectomy and diverting colostomy placement.  Postoperative ileus.   Urology and ID have been consulted for epididymitis.   9/19 Postoperative anemia: Required 1 unit packed red blood cells.    Positive left knee pain, positive effusion, underwent left knee aspiration and injection with good toleration.  Fluid with no crystals, gram stain no organisms.      09/26 NG tube has been removed.   09/27 patient with  vomiting and abdominal distention, abdominal radiographs with recurrent ileus, NG tube has replaced and connected to low intermittent suction.   10/06 Patient started on clear liquid diet   Assessment & Plan:   Principal Problem:   Diverticulitis of large intestine with perforation and abscess without bleeding Active Problems:   Diverticulitis of intestine with abscess   Atrial fibrillation, chronic (HCC)   Essential hypertension   Diverticulitis   AKI (acute kidney injury) ([place])   Severe sepsis with acute organ dysfunction (HCC)   Intra-abdominal abscess (HCC)   Epididymitis   Severe sepsis due to perforated diverticulitis, sp colectomy. Left psoas collection. Post operative ileus.  - Patient with persistent/ recurrent ileus,   Ileus managed by general surgery. NG tube on suction for couple times already, but appears to be improving, tolerating clear liquid diet so far. .  - CT 03/15/2021 shows continued complex fluid collection along left iliopsoas.  SBO with transition in mid jejunum.  Surgery consulted IR to evaluate for drainage of the left psoas abscess however, IR do not recommend aspirating at this point in time.  No pain today.  S/p Gastrografin study on 03/16/2021.  Patient being managed by intermittent NG tube clamping.  Remains on TPN.   -Antibiotics management per ID, to finish Zosyn today. -  CT 10/4 with ileus although improved distention of small bowel, stable psoas collection.  Patient's NG tube fell out yesterday.     2. Post operative anemia/anemia of chronic disease. - sp 1 unit PRBC on 03/07/2021. -Transfuse for hemoglobin less than 7.  3. Paroxysmal atrial fibrillation/ HTN. Patient was on amiodarone and metoprolol with good rate control. On enoxaparin for anticoagulation, holding DOAC until po intake more consistent.   -  Remains on Cardizem drip given ileus and n.p.o. status, tolerating small amount of liquid over last 24 hours, he will be started on p.o. Cardizem  hoping to taper off his Cardizem drip.   - Due to significant wheezes, IV metoprolol was also discontinued on 03/21/2021.     4. Right epididymitis resolved, completed therapy with doxycycline.    5. AKI with hypokalemia/ hyperkalemia,hyponatremia and hypophosphatemia.  Continue TPN per pharmacy protocol.  All electrolytes normal with AKI resolved.   6. Alcohol withdrawal. Resolved.    7. Left knee arthritis. Clinically improved after steroid injection.  Pain is well controlled.   8.  Essential hypertension: Controlled.  Amlodipine discontinued due to recurrent ileus and surgery recommendations to withhold oral medications.  9.  Dyspnea/atelectasis acute hypoxic respiratory failure: Continues to improve and feels much better.  No more wheezes.  Off of oxygen.  Solu-Medrol discontinued yesterday after 2 days.  Continue as needed DuoNeb.  Continue to hold metoprolol.  Will reconsider resuming in next 1 to 2 days with lower dose, likely orally if he continues to do well with p.o. intake  10. Anasarca  -Most likely due to third spacing, will increase his IV Lasix.  .    Status is: Inpatient  Remains inpatient appropriate because:Inpatient level of care appropriate due to severity of illness  Dispo: The patient is from: Home              Anticipated d/c is to: [facility]              Patient currently is not medically stable to d/c.  Will be ready when cleared by general surgery   Difficult to place patient No   DVT prophylaxis: Enoxxaparin   Code Status:    full  Family Communication:  None at bedside    Nutrition Status: Nutrition Problem: Inadequate oral intake Etiology: acute illness (diverticulitis) Signs/Symptoms: per patient/family report, NPO status Interventions: Prostat, Boost Plus    Consultants:  Surgery  ID Urology Cardiology orthopedic  Subjective:  He denies abdominal pain today, no nausea orvomiting  Objective: Vitals:   03/25/21 0200 03/25/21 0328 03/25/21  0600 03/25/21 0732  BP: (!) 130/55  (!) 141/59 (!) 122/59  Pulse: 79  89   Resp: (!) 22  (!) 27   Temp:    98.7 F (37.1 C)  TempSrc:    Axillary  SpO2: 94% 96% 93%   Weight:      Height:        Intake/Output Summary (Last 24 hours) at 03/25/2021 1049 Last data filed at 03/25/2021 0600 Gross per 24 hour  Intake 2368.01 ml  Output 3450 ml  Net -1081.99 ml   Filed Weights   03/07/21 0342 03/10/21 0447 03/16/21 0343  Weight: 94 kg 102 kg 98.9 kg    Examination:    Awake Alert, Oriented X 3, No new F.N deficits, Normal affect, frail Symmetrical Chest wall movement, diminished air entry at the bases RRR,No Gallops,Rubs or new Murmurs, No Parasternal Heave All sounds present today, colostomy with liquid output, wound VAC in lower abdomen surgical wound. No Cyanosis, Clubbing, anasarca with significant lower extremity edema, right upper> left upper edema.  Data Reviewed: I have personally reviewed following labs and imaging studies  CBC: Recent Labs  Lab 03/20/21 0439 03/23/21 0437 03/24/21 1300  WBC 11.7* 9.2 8.0  HGB 8.1* 7.4* 7.7*  HCT 26.0* 23.3* 25.2*  MCV 101.2* 97.5 98.8  PLT 198 281 025   Basic Metabolic Panel: Recent Labs  Lab 03/19/21 0244 03/20/21 0439 03/21/21 0406 03/23/21 0437 03/24/21 0439  NA 141 142 139 135 138  K 4.3 3.7 3.8 3.7 3.7  CL 109 108 105 99 100  CO2 26 26 27 28 28   GLUCOSE 125* 144* 245* 203* 177*  BUN 25* 28* 33* 42* 44*  CREATININE 1.17 1.14 1.07 1.01 1.19  CALCIUM 7.9* 7.8* 7.7* 7.8* 8.3*  MG 2.2 2.2 2.3  --  2.3  PHOS 3.2  --  3.3  --  4.5   GFR: Estimated Creatinine Clearance: 70.3 mL/min (by C-G formula based on SCr of 1.19 mg/dL). Liver Function Tests: Recent Labs  Lab 03/21/21 0406 03/24/21 0439  AST 27 33  ALT 31 47*  ALKPHOS 75 88  BILITOT 0.5 0.6  PROT 5.6* 6.1*  ALBUMIN <1.5* 1.7*   No results for input(s): LIPASE, AMYLASE in the last 168 hours. No results for input(s): AMMONIA in the last 168  hours. Coagulation Profile: No results for input(s): INR, PROTIME in the last 168 hours. Cardiac Enzymes: No results for input(s): CKTOTAL, CKMB, CKMBINDEX, TROPONINI in the last 168 hours. BNP (last 3 results) No results for input(s): PROBNP in the last 8760 hours. HbA1C: No results for input(s): HGBA1C in the last 72 hours. CBG: Recent Labs  Lab 03/24/21 1628 03/24/21 2019 03/24/21 2356 03/25/21 0406 03/25/21 0800  GLUCAP 152* 134* 130* 125* 134*   Lipid Profile: Recent Labs    03/24/21 0439  TRIG 198*   Thyroid Function Tests: No results for input(s): TSH, T4TOTAL, FREET4, T3FREE, THYROIDAB in the last 72 hours. Anemia Panel: No results for input(s): VITAMINB12, FOLATE, FERRITIN, TIBC, IRON, RETICCTPCT in the last 72 hours.   Radiology Studies: I have reviewed all of the imaging during this hospital visit personally  Scheduled Meds:  Chlorhexidine Gluconate Cloth  6 each Topical Daily   diltiazem  30 mg Oral Q6H   enoxaparin (LOVENOX) injection  100 mg Subcutaneous Q12H   furosemide  40 mg Intravenous BID   insulin aspart  0-15 Units Subcutaneous Q4H   ketorolac  15 mg Intravenous Q6H   lidocaine  1 patch Transdermal Q24H   lidocaine  1 patch Transdermal Q24H   metoCLOPramide (REGLAN) injection  10 mg Intravenous Q6H   nicotine  21 mg Transdermal Daily   pantoprazole (PROTONIX) IV  40 mg Intravenous Q12H   potassium chloride  20 mEq Oral Once   sodium chloride flush  10 mL Intracatheter Q12H   Continuous Infusions:  sodium chloride Stopped (03/24/21 2259)   diltiazem (CARDIZEM) infusion 10 mg/hr (03/25/21 0337)   methocarbamol (ROBAXIN) IV 1,000 mg (03/25/21 0610)   piperacillin-tazobactam (ZOSYN)  IV 3.375 g (03/25/21 0846)   TPN ADULT (ION) 85 mL/hr at 03/25/21 0222     LOS: 53 days   Phillips Climes, MD [facility] Hospitalist

## 2021-03-25 NOTE — Progress Notes (Signed)
Physical Therapy Treatment Patient Details Name: Travis Conrad MRN: [id number] DOB: 08/24/1950 Today's Date: 03/25/2021   History of Present Illness 70 yo admitted 9/3 with abdominal pain, vomiting and ileus. 9/4 Afib with RVR. 9/8 intrabdominal abscess s/p IR drain placed. 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 Pt found to have lt iliopsoas hematoma on CT 9/16.  9/20 left knee aspiration. 9/26 NG tube removed. 9/27 NGT replaced due to vomiting. 10/1 atelectasis. PMhx: HTN, HLD, Afib, ETOH use    PT Comments    Limited today to initial standing exercise prior to gait due to HR jumped significantly from the 90's to max 151 bpm with sustained HR in the mid 130's bpm..Gait deferred.   Recommendations for follow up therapy are one component of a multi-disciplinary discharge planning process, led by the attending physician.  Recommendations may be updated based on patient status, additional functional criteria and insurance authorization.  Follow Up Recommendations  Supervision/Assistance - 24 hour;Home health PT     Equipment Recommendations  3in1 (PT)    Recommendations for Other Services       Precautions / Restrictions Precautions Precautions: Fall Precaution Comments: wound VAC     Mobility  Bed Mobility Overal bed mobility: Needs Assistance Bed Mobility: Rolling;Sidelying to Sit;Sit to Supine Rolling: Min assist Sidelying to sit: Mod assist   Sit to supine: Mod assist   General bed mobility comments: cues for sequencing,  min assist needed to hooklying, but somewhat a struggle with L LE.  min to roll and mod truncal assist up only L elbow.    Transfers Overall transfer level: Needs assistance Equipment used: Rolling walker (2 wheeled) Transfers: Sit to/from Stand Sit to Stand: Min assist         General transfer comment: cues for best technique, forward assist if surface is low.  Ambulation/Gait              General Gait Details: unable due to HR rose and was sustained in the mid to upper 130's   Stairs             Wheelchair Mobility    Modified Rankin (Stroke Patients Only)       Balance     Sitting balance-Leahy Scale: Good Sitting balance - Comments: EOB without UE support     Standing balance-Leahy Scale: Poor Standing balance comment: reliant on RW in standing                            Cognition Arousal/Alertness: Awake/alert Behavior During Therapy: Anxious;WFL for tasks assessed/performed Overall Cognitive Status: Within Functional Limits for tasks assessed                       Memory: Decreased short-term memory                Exercises General Exercises - Lower Extremity Hip ABduction/ADduction: AROM;Strengthening;Both;10 reps;Standing Hip Flexion/Marching: AROM;Strengthening;Both;10 reps;Standing    General Comments        Pertinent Vitals/Pain Pain Assessment: Faces Faces Pain Scale: Hurts a little bit Pain Location: abdomen Pain Descriptors / Indicators: Discomfort;Grimacing Pain Intervention(s): Monitored during session    Home Living                      Prior Function            PT Goals (current goals can now  be found in the care plan section) Acute Rehab PT Goals Patient Stated Goal: to get back to baseline PT Goal Formulation: With patient/family Time For Goal Achievement: 03/28/21 Potential to Achieve Goals: Good Progress towards PT goals: Progressing toward goals    Frequency    Min 3X/week      PT Plan Current plan remains appropriate    Co-evaluation              AM-PAC PT "6 Clicks" Mobility   Outcome Measure  Help needed turning from your back to your side while in a flat bed without using bedrails?: A Little Help needed moving from lying on your back to sitting on the side of a flat bed without using bedrails?: A Little Help needed moving to and from a bed to  a chair (including a wheelchair)?: A Little Help needed standing up from a chair using your arms (e.g., wheelchair or bedside chair)?: A Little Help needed to walk in hospital room?: A Lot Help needed climbing 3-5 steps with a railing? : A Lot 6 Click Score: 16    End of Session   Activity Tolerance: Treatment limited secondary to medical complications (Comment);Patient tolerated treatment well Patient left: in bed;with call bell/phone within reach;with bed alarm set Nurse Communication: Mobility status PT Visit Diagnosis: Other abnormalities of gait and mobility (R26.89);Difficulty in walking, not elsewhere classified (R26.2)     Time: 1725-1745 PT Time Calculation (min) (ACUTE ONLY): 20 min  Charges:  $Therapeutic Exercise: 8-22 mins                     03/25/2021  Ginger Carne., PT Acute Rehabilitation Services [phone]  (pager) [phone]  (office)   Tessie Fass Lathyn Griggs 03/25/2021, 6:24 PM

## 2021-03-25 NOTE — Progress Notes (Signed)
Nutrition Follow-up  DOCUMENTATION CODES:  Not applicable  INTERVENTION:  -Recommend daily weights as pt is diuresing  -Boost Breeze po TID, each supplement provides 250 kcal and 9 grams of protein -PROSource PLUS PO 35ms BID, each supplement provides 100 kcals and 15 grams of protein -Continue TPN management per Pharmacy  NUTRITION DIAGNOSIS:  Inadequate oral intake related to acute illness (diverticulitis) as evidenced by per patient/family report, NPO status. -- ongoing  GOAL:  Patient will meet greater than or equal to 90% of their needs -- met with TPN  MONITOR:  Supplement acceptance, PO intake, Diet advancement, Weight trends, Labs, I & O's  REASON FOR ASSESSMENT:  Consult New TPN/TNA  ASSESSMENT:  Pt with PMH significant for HTN, Afib, h/o diverticulitis, heavy EtOH use, and high cholesterol admitted with severe sepsis due to perforated diverticulitis/intra-abdominal abscess. Pt now s/p laparotomy w/ colectomy complicated by postoperative ileus.  9/04 - developed Afib w/ rapid ventricular response, required IV amiodarone infusion 9/07 - f/u CT w/ increased size of pelvic abscess 9/08 - s/p drainage catheter placement of pelvic abscess 9/09 - TPN initiated 9/10 - s/p ex lap, sigmoid colectomy, end colostomy creation (Hartmann's procedure) w/ enterorrhaphy and drainage of intra-abdominal abscess and woundVAC placement of 50cm area of abdomen 9/16 - CT revealed heterogenous left iliopsoas collection which may represent abscess or hematoma, stranding in the retroperitoneum and subcu tissue; per IR do no recommend aspiration or drainage at this time  9/19 - post-operative anemia requiring 1 unit PRBC   9/20 - s/p lt knee aspiration and injection due to lt knee effusions 9/23 - diet advanced to clear liquids 9/26 - NGT removed; TPN rate reduced to half 9/27 - NGT replaced and connected to LIWS 2/2 vomiting and abdominal distention, abd xray showing recurrent ileus; pt made  NPO 9/29 - surgical drain removed  10/04 - CT w/ ileus although improved distention of small bowel, stable posas collection 10/05 - NGT fell out per MD note 10/06 - pt initiated on clear liquid diet  Pt states that he does not feel well this morning but denies N/V. Reports rectal discharge yesterday in addition to gas/stool from stoma. Per RN, pt seems to have increased SOB. Surgery with concern that this is causing pt to swallow a lot of air and complicating GI picture. Planning for repeat KUB/CXR today. TPN to continue meeting 100% nutritional needs. Goal rate is 863mhr which provides 102g protein and 2104 kcals. Pt to remain on clear liquid diet at this time per Surgery. No PO intake documented thus far.   UOP: [id number] hours I/O: +[id number] admit  Pt with anasarca. Continues on IV lasix.  Pt needs new weight as weight has not been updated since 9/28. Recommend daily weights as pt is diuresing and receiving TPN.  Admit weight: 79.4 kg   Medications: lasix, reglan, klor-con, SSI Q4H, protonix, IV abx Labs reviewed. CBGs 125-159x 24 hours  Diet Order:   Diet Order             Diet clear liquid Room service appropriate? Yes; Fluid consistency: Thin  Diet effective now                  EDUCATION NEEDS:  No education needs have been identified at this time  Skin:  Skin Assessment: Skin Integrity Issues: Skin Integrity Issues:: Wound VAC Wound Vac: abdomen Incisions: closed incision to abdomen  Last BM:  10/6 type 7 via colostomy  Height:  Ht Readings from Last  1 Encounters:  02/26/21 6' (1.829 m)   Weight:  Wt Readings from Last 10 Encounters:  03/16/21 98.9 kg  06/28/20 79.4 kg  04/06/20 78.7 kg  01/05/20 78 kg  11/03/19 78.7 kg  10/03/19 79.4 kg  08/20/19 78.9 kg  07/22/19 79.1 kg  05/02/19 79.4 kg  04/02/18 80.3 kg   BMI:  Body mass index is 29.57 kg/m.  Estimated Nutritional Needs:  Kcal:  2000-2200 Protein:  100-110 grams Fluid:   >2L    Larkin Ina, MS, RD, LDN (she/her/hers) RD pager number and weekend/on-call pager number located in [place].

## 2021-03-25 NOTE — Progress Notes (Signed)
Progress Note  27 Days Post-Op  Subjective: Patient reports not feeling well this AM but denies nausea or vomiting. He had some discharge per rectum yesterday and gas/stool from stoma as well. RN reported he seemed to have increased SOB yesterday.   Objective: Vital signs in last 24 hours: Temp:  [97.6 F (36.4 C)-98.7 F (37.1 C)] 98.7 F (37.1 C) (10/07 0732) Pulse Rate:  [79-89] 89 (10/07 0600) Resp:  [22-27] 27 (10/07 0600) BP: (115-141)/(53-67) 122/59 (10/07 0732) SpO2:  [92 %-97 %] 93 % (10/07 0600) Last BM Date: 03/24/21  Intake/Output from previous day: 10/06 0701 - 10/07 0700 In: 3823 [I.V.:3207.6; IV Piggyback:615.4] Out: 4350 [Urine:4350] Intake/Output this shift: No intake/output data recorded.  PE: Gen:  Alert, NAD, pleasant Pulm:  tachypnea and some wheezing bilaterally  Abd: Soft, mild distention, NT, +BS. Stoma appears viable. Dark mucus like stool present in ostomy appliance. midline wound with VAC present   Lab Results:  Recent Labs    03/23/21 0437 03/24/21 1300  WBC 9.2 8.0  HGB 7.4* 7.7*  HCT 23.3* 25.2*  PLT 281 346   BMET Recent Labs    03/23/21 0437 03/24/21 0439  NA 135 138  K 3.7 3.7  CL 99 100  CO2 28 28  GLUCOSE 203* 177*  BUN 42* 44*  CREATININE 1.01 1.19  CALCIUM 7.8* 8.3*   PT/INR No results for input(s): LABPROT, INR in the last 72 hours. CMP     Component Value Date/Time   NA 138 03/24/2021 0439   NA 134 09/30/2019 0959   NA 142 03/03/2016 0921   K 3.7 03/24/2021 0439   K 4.3 03/03/2016 0921   CL 100 03/24/2021 0439   CO2 28 03/24/2021 0439   CO2 25 03/03/2016 0921   GLUCOSE 177 (H) 03/24/2021 0439   GLUCOSE 89 03/03/2016 0921   BUN 44 (H) 03/24/2021 0439   BUN 11 09/30/2019 0959   BUN 13.6 03/03/2016 0921   CREATININE 1.19 03/24/2021 0439   CREATININE 1.2 03/03/2016 0921   CALCIUM 8.3 (L) 03/24/2021 0439   CALCIUM 9.6 03/03/2016 0921   PROT 6.1 (L) 03/24/2021 0439   PROT 7.3 03/03/2016 0921   ALBUMIN  1.7 (L) 03/24/2021 0439   ALBUMIN 3.8 03/03/2016 0921   AST 33 03/24/2021 0439   AST 36 (H) 03/03/2016 0921   ALT 47 (H) 03/24/2021 0439   ALT 31 03/03/2016 0921   ALKPHOS 88 03/24/2021 0439   ALKPHOS 116 03/03/2016 0921   BILITOT 0.6 03/24/2021 0439   BILITOT 0.71 03/03/2016 0921   GFRNONAA >60 03/24/2021 0439   GFRNONAA 67 07/25/2013 1712   GFRAA >60 10/03/2019 0930   GFRAA 77 07/25/2013 1712   Lipase     Component Value Date/Time   LIPASE 22 02/19/2021 1653       Studies/Results: VAS [place] UPPER EXTREMITY VENOUS DUPLEX  Result Date: 03/24/2021 UPPER VENOUS STUDY  Patient Name:  Travis Conrad  Date of Exam:   03/24/2021 Medical Rec #: [id number]         Accession #:    [id number] Date of Birth: 14-Jan-1951        Patient Gender: M Patient Age:   70 years Exam Location:  [facility] Procedure:      VAS [place] UPPER EXTREMITY VENOUS DUPLEX Referring Phys: RAVI PAHWANI --------------------------------------------------------------------------------  Indications: Edema Risk Factors: None identified. Limitations: Poor ultrasound/tissue interface, bandages and line. Comparison Study: No prior studies. Performing Technologist: Oliver Hum RVT  Examination Guidelines:  A complete evaluation includes B-mode imaging, spectral Doppler, color Doppler, and power Doppler as needed of all accessible portions of each vessel. Bilateral testing is considered an integral part of a complete examination. Limited examinations for reoccurring indications may be performed as noted.  Right Findings: +----------+------------+---------+-----------+----------+-------+ RIGHT     CompressiblePhasicitySpontaneousPropertiesSummary +----------+------------+---------+-----------+----------+-------+ IJV           Full       Yes       Yes                      +----------+------------+---------+-----------+----------+-------+ Subclavian    Full       Yes       Yes                       +----------+------------+---------+-----------+----------+-------+ Axillary      Full       Yes       Yes                      +----------+------------+---------+-----------+----------+-------+ Brachial      Full       Yes       Yes                      +----------+------------+---------+-----------+----------+-------+ Radial        Full                                          +----------+------------+---------+-----------+----------+-------+ Ulnar         Full                                          +----------+------------+---------+-----------+----------+-------+ Cephalic      Full                                          +----------+------------+---------+-----------+----------+-------+ Basilic       Full                                          +----------+------------+---------+-----------+----------+-------+  Left Findings: +----------+------------+---------+-----------+----------+-------+ LEFT      CompressiblePhasicitySpontaneousPropertiesSummary +----------+------------+---------+-----------+----------+-------+ Subclavian    Full       Yes       Yes                      +----------+------------+---------+-----------+----------+-------+  Summary:  Right: No evidence of deep vein thrombosis in the upper extremity. No evidence of superficial vein thrombosis in the upper extremity.  Left: No evidence of thrombosis in the subclavian.  *See table(s) above for measurements and observations.  Diagnosing physician: Jamelle Haring Electronically signed by Jamelle Haring on 03/24/2021 at 6:24:59 PM.    Final     Anti-infectives: Anti-infectives (From admission, onward)    Start     Dose/Rate Route Frequency Ordered Stop   03/09/21 0800  piperacillin-tazobactam (ZOSYN) IVPB 3.375 g        3.375 g 12.5 mL/hr over 240 Minutes Intravenous Every 8 hours 03/09/21 0706 03/25/21 2359  02/26/21 0100  doxycycline (VIBRAMYCIN) 100 mg in sodium chloride 0.9 % 250  mL IVPB        100 mg 125 mL/hr over 120 Minutes Intravenous 2 times daily 02/26/21 0005 03/08/21 2359   02/20/21 0300  piperacillin-tazobactam (ZOSYN) IVPB 3.375 g        3.375 g 12.5 mL/hr over 240 Minutes Intravenous Every 8 hours 02/19/21 1907 03/08/21 2359   02/19/21 1900  ceFEPIme (MAXIPIME) 2 g in sodium chloride 0.9 % 100 mL IVPB  Status:  Discontinued        2 g 200 mL/hr over 30 Minutes Intravenous  Once 02/19/21 1857 02/19/21 1906   02/19/21 1900  metroNIDAZOLE (FLAGYL) IVPB 500 mg  Status:  Discontinued        500 mg 100 mL/hr over 60 Minutes Intravenous  Once 02/19/21 1857 02/19/21 1906   02/19/21 1815  piperacillin-tazobactam (ZOSYN) IVPB 3.375 g        3.375 g 100 mL/hr over 30 Minutes Intravenous  Once 02/19/21 1809 02/19/21 1944        Assessment/Plan POD 27 s/p ex lap, sigmoid colectomy, end colostomy with enterorrhaphy and drainage of intraabdominal abscess - Dr. Kieth Brightly and Dr. Radene Knee 9/10 for Acute sigmoid diverticulitis with contained perforation and abscess - CT 9/16 demonstrates heterogenous left iliopsoas collection likely hematoma, stranding in the retroperitoneum and subcu tissue.  Distal colon appears decompressed and very small caliber but there is contrast entering this area; there is additionally a similarly small-caliber segment of colon in the proximal transverse and the ascending colon is not dilated- suspect over all this represents small bowel/gastric ileus.  IR does not recommend draining the hematoma at this point. ID following and recommending Zosyn through 10/17  - NGT replaced 9/27  - CT 9/27 with continued complex fluid collection along left psoas, SBO with transition in mid jejunum, post-surgical changes of colon resection with colostomy - Was started on reglan last weekend. Discussed with him the risk of tardive dyskinesia/EPS on 9/20. Patient stated understanding and is okay with continuing Reglan.  - Gastrograffin study 9/28 showed Stable  gaseous distention of the small bowel, oral contrast progressed into the colon - CT 10/4 with ileus although improved distention of small bowel, stable psoas collection - NGT fell out 10/5 and patient has tolerated it being out on CLD but only taking in small amts, keep on CLD as tolerated - KUB/CXR this AM - I am concerned he is swallowing a lot of air with trying to breath better and that is complicating GI picture. Will discuss with primary attending as well  - WOCN following for new ostomy teaching and vac changes - continue VAC through the weekend, but wound is much shallower and likely will DC vac next week - Cont TPN - Encouraged ambulation (working with therapies), use IS, multimodal pain control  - Cont abx per ID recs   FEN - CLD, TPN VTE - SCDs, therapeutic lovenox  ID - Zosyn 9/4 - 10/17 (per ID) Foley - out, voiding.    Per primary: A fib - cards following  AKI - resolved ABL anemia -  stable HTN ETOH use L knee pain - per ortho  LOS: 33 days    Norm Parcel, [facility] Surgery 03/25/2021, 9:12 AM Please see Amion for pager number during day hours 7:00am-4:30pm

## 2021-03-25 NOTE — Progress Notes (Signed)
Patient unable to take breathing treatment due to anxiety. Hospitalist Chotiner MD ordered one time dose of ativan 1mg  IV

## 2021-03-25 NOTE — Progress Notes (Signed)
RT note. RT called to patient room due to patient complaining of SOB. Upon arrival patient on 2L [place] sat 96% and no labored breathing noted. Patient has clear bilateral  BS. Attempt to give patient duoneb PRN, patient requested it be taken off once placed on because it was giving him anxiety. Duoneb still in nebulizer if patient changes his mind. RN aware. RT will continue to monitor.

## 2021-03-26 DIAGNOSIS — K56609 Unspecified intestinal obstruction, unspecified as to partial versus complete obstruction: Secondary | ICD-10-CM | POA: Diagnosis not present

## 2021-03-26 DIAGNOSIS — K5720 Diverticulitis of large intestine with perforation and abscess without bleeding: Secondary | ICD-10-CM | POA: Diagnosis not present

## 2021-03-26 DIAGNOSIS — I482 Chronic atrial fibrillation, unspecified: Secondary | ICD-10-CM | POA: Diagnosis not present

## 2021-03-26 LAB — BASIC METABOLIC PANEL
Anion gap: 6 (ref 5–15)
BUN: 30 mg/dL — ABNORMAL HIGH (ref 8–23)
CO2: 27 mmol/L (ref 22–32)
Calcium: 7.7 mg/dL — ABNORMAL LOW (ref 8.9–10.3)
Chloride: 101 mmol/L (ref 98–111)
Creatinine, Ser: 1.03 mg/dL (ref 0.61–1.24)
GFR, Estimated: >60 mL/min (ref >60)
Glucose, Bld: 170 mg/dL — ABNORMAL HIGH (ref 70–99)
Potassium: 4 mmol/L (ref 3.5–5.1)
Sodium: 134 mmol/L — ABNORMAL LOW (ref 135–145)

## 2021-03-26 LAB — CBC
HCT: 25.5 % — ABNORMAL LOW (ref 39.0–52.0)
Hemoglobin: 7.9 g/dL — ABNORMAL LOW (ref 13.0–17.0)
MCH: 30.4 pg (ref 26.0–34.0)
MCHC: 31 g/dL (ref 30.0–36.0)
MCV: 98.1 fL (ref 80.0–100.0)
Platelets: 356 10*3/uL (ref 150–400)
RBC: 2.6 MIL/uL — ABNORMAL LOW (ref 4.22–5.81)
RDW: 15.4 % (ref 11.5–15.5)
WBC: 7.6 10*3/uL (ref 4.0–10.5)
nRBC: 0.3 % — ABNORMAL HIGH (ref 0.0–0.2)

## 2021-03-26 LAB — PHOSPHORUS: Phosphorus: 3.4 mg/dL (ref 2.5–4.6)

## 2021-03-26 LAB — GLUCOSE, CAPILLARY
Glucose-Capillary: 157 mg/dL — ABNORMAL HIGH (ref 70–99)
Glucose-Capillary: 143 mg/dL — ABNORMAL HIGH (ref 70–99)
Glucose-Capillary: 159 mg/dL — ABNORMAL HIGH (ref 70–99)
Glucose-Capillary: 136 mg/dL — ABNORMAL HIGH (ref 70–99)
Glucose-Capillary: 153 mg/dL — ABNORMAL HIGH (ref 70–99)
Glucose-Capillary: 129 mg/dL — ABNORMAL HIGH (ref 70–99)

## 2021-03-26 LAB — MAGNESIUM: Magnesium: 2.1 mg/dL (ref 1.7–2.4)

## 2021-03-26 MED ORDER — AMIODARONE HCL 200 MG PO TABS
200.0000 mg | ORAL_TABLET | Freq: Every day | ORAL | Status: DC
  Administered 2021-03-26 – 2021-04-06 (×12): 200 mg via ORAL
  Filled 2021-03-26 (×12): qty 1

## 2021-03-26 MED ORDER — DILTIAZEM HCL-DEXTROSE 125-5 MG/125ML-% IV SOLN (PREMIX)
5.0000 mg/h | INTRAVENOUS | Status: AC
Start: 2021-03-26 — End: 2021-03-26
  Administered 2021-03-26: 5 mg/h via INTRAVENOUS
  Filled 2021-03-26: qty 125

## 2021-03-26 MED ORDER — METHOCARBAMOL 500 MG PO TABS
1000.0000 mg | ORAL_TABLET | Freq: Four times a day (QID) | ORAL | Status: DC
  Administered 2021-03-26 – 2021-04-06 (×44): 1000 mg via ORAL
  Filled 2021-03-26 (×45): qty 2

## 2021-03-26 MED ORDER — ENSURE SURGERY PO LIQD
237.0000 mL | Freq: Two times a day (BID) | ORAL | Status: DC
  Administered 2021-03-26 – 2021-03-27 (×3): 237 mL via ORAL
  Filled 2021-03-26 (×4): qty 237

## 2021-03-26 MED ORDER — LORAZEPAM 1 MG PO TABS
1.0000 mg | ORAL_TABLET | Freq: Four times a day (QID) | ORAL | Status: DC | PRN
  Administered 2021-03-26 – 2021-04-05 (×21): 1 mg via ORAL
  Filled 2021-03-26 (×22): qty 1

## 2021-03-26 MED ORDER — ACETAMINOPHEN 325 MG PO TABS
650.0000 mg | ORAL_TABLET | Freq: Four times a day (QID) | ORAL | Status: DC
  Administered 2021-03-26 – 2021-04-06 (×45): 650 mg via ORAL
  Filled 2021-03-26 (×45): qty 2

## 2021-03-26 MED ORDER — TRACE MINERALS CU-MN-SE-ZN 300-55-60-3000 MCG/ML IV SOLN
INTRAVENOUS | Status: AC
  Filled 2021-03-26: qty 700

## 2021-03-26 MED ORDER — POTASSIUM CHLORIDE CRYS ER 20 MEQ PO TBCR
20.0000 meq | EXTENDED_RELEASE_TABLET | Freq: Once | ORAL | Status: AC
  Administered 2021-03-26: 20 meq via ORAL
  Filled 2021-03-26: qty 1

## 2021-03-26 MED ORDER — DOCUSATE SODIUM 100 MG PO CAPS
100.0000 mg | ORAL_CAPSULE | Freq: Two times a day (BID) | ORAL | Status: DC
  Administered 2021-03-26 – 2021-04-06 (×22): 100 mg via ORAL
  Filled 2021-03-26 (×22): qty 1

## 2021-03-26 MED ORDER — ZOLPIDEM TARTRATE 5 MG PO TABS
5.0000 mg | ORAL_TABLET | Freq: Every evening | ORAL | Status: DC | PRN
  Administered 2021-03-27 – 2021-04-05 (×10): 5 mg via ORAL
  Filled 2021-03-26 (×10): qty 1

## 2021-03-26 NOTE — Progress Notes (Signed)
PROGRESS NOTE    JERMANIE MINSHALL  NAT:[id number] DOB: May 25, 1951 DOA: 02/19/2021 PCP: [name], MD    Brief Narrative:   Mr. Rybarczyk was admitted to the hospital with the working diagnosis of severe sepsis due to perforated diverticulitis/ intra-abdominal abscess. Now sp lapartomy, with colectomy, complicated with postoperative ileus.   70 year old male past medical history for atrial fibrillation, dyslipidemia, hypertension and diverticulosis who presented with nausea, vomiting and abdominal pain.  Reported several days of worse abdominal pain, and poor oral intake.  Because of severe symptoms he presented to the hospital.  On his initial physical examination his blood pressure was 84/65, heart rate 57, respiratory rate 20, oxygen saturation 91%, he was ill-looking appearing, heart S1-S2, present, irregular irregular, lungs clear to auscultation, abdomen tender to palpation in the mid abdomen, no lower extremity edema.   CT of the abdomen and pelvis with acute sigmoid diverticulitis with small extraluminal gas and fluid collection in the sigmoid mesocolon, 3.5 x 1.5 x 2.7 cm consistent with perforation and abscess.  Dilated proximal and decompressed distal small bowel loops, possible ileus.   9/4 developed atrial fibrillation with rapid ventricular response, required intravenous amiodarone infusion.   Follow-up CT 9/7 with increased size pelvic abscess. On 9/8 patient underwent CT-guided drainage of pelvic abscess.   09/10 Patient underwent exploratory laparotomy with sigmoid colectomy and diverting colostomy placement.  Postoperative ileus.   Urology and ID have been consulted for epididymitis.   9/19 Postoperative anemia: Required 1 unit packed red blood cells.    Positive left knee pain, positive effusion, underwent left knee aspiration and injection with good toleration.  Fluid with no crystals, gram stain no organisms.      09/26 NG tube has been removed.   09/27 patient with  vomiting and abdominal distention, abdominal radiographs with recurrent ileus, NG tube has replaced and connected to low intermittent suction.   10/06 Patient started on clear liquid diet   Assessment & Plan:   Principal Problem:   Diverticulitis of large intestine with perforation and abscess without bleeding Active Problems:   Diverticulitis of intestine with abscess   Atrial fibrillation, chronic (HCC)   Essential hypertension   Diverticulitis   AKI (acute kidney injury) ([place])   Severe sepsis with acute organ dysfunction (HCC)   Intra-abdominal abscess (HCC)   Epididymitis   Severe sepsis due to perforated diverticulitis, sp colectomy. Left psoas collection. Post operative ileus.   - CT 03/15/2021 shows continued complex fluid collection along left iliopsoas.  SBO with transition in mid jejunum.  Surgery consulted IR to evaluate for drainage of the left psoas abscess however, IR do not recommend aspirating at this point in time.  No pain today.  S/p Gastrografin study on 03/16/2021.  Patient being managed by intermittent NG tube clamping.  Remains on TPN.   -Antibiotics management per ID, tube Zosyn 10/7 -  CT 10/4 with ileus although improved distention of small bowel, stable psoas collection.     - Patient with persistent/ recurrent ileus,   Ileus managed by general surgery. NG tube on suction for couple times already, NGT is discontinued, he is currently on clear liquid diet .  Ensure added today.  Post operative anemia/anemia of chronic disease. - sp 1 unit PRBC on 03/07/2021. -Transfuse for hemoglobin less than 7.  Paroxysmal atrial fibrillation/ HTN. Patient was on amiodarone and metoprolol with good rate control. On enoxaparin for anticoagulation, holding DOAC until po intake more consistent.   -Remains on Cardizem drip for heart  rate control, added p.o. Cardizem yesterday, as well blood pressure is more soft today (most likely due to IV diuresis), so I will add amiodarone for  better heart rate control.   - Due to significant wheezes, IV metoprolol was also discontinued on 03/21/2021.     Right epididymitis resolved, completed therapy with doxycycline.    AKI with hypokalemia/ hyperkalemia,hyponatremia and hypophosphatemia.  Continue TPN per pharmacy protocol.  All electrolytes normal with AKI resolved.   Alcohol withdrawal.  Resolved.    Left knee arthritis.  \Clinically improved after steroid injection.  Pain is well controlled.   Essential hypertension:  Controlled.  Amlodipine discontinued due to recurrent ileus and surgery recommendations to withhold oral medications.  Dyspnea/atelectasis acute hypoxic respiratory failure:  - Continues to improve and feels much better.   - No more wheezes.  Off of oxygen.  Off steroids . - Continue to hold metoprolol.  Will reconsider resuming in next 1 to 2 days with lower dose, likely orally if he continues to do well with p.o. intake  Anasarca  -Most likely due to third spacing, increased IV Lasix, as well discussed with pharmacy, will give concentrated TPN.    Status is: Inpatient  Remains inpatient appropriate because:Inpatient level of care appropriate due to severity of illness  Dispo: The patient is from: Home              Anticipated d/c is to: [facility]              Patient currently is not medically stable to d/c.  Will be ready when cleared by general surgery   Difficult to place patient No   DVT prophylaxis: Enoxaparin   Code Status:    full  Family Communication:  None at bedside    Nutrition Status: Nutrition Problem: Inadequate oral intake Etiology: acute illness (diverticulitis) Signs/Symptoms: per patient/family report, NPO status Interventions: Prostat, Boost Plus    Consultants:  Surgery  ID Urology Cardiology orthopedic  Subjective:  No significant events overnight as discussed with staff, patient himself denies any complaints.  Objective: Vitals:   03/25/21 2330 03/26/21  0400 03/26/21 0435 03/26/21 0800  BP: (!) 141/63 129/74 124/72 105/80  Pulse: (!) 132 (!) 123 (!) 122 91  Resp: (!) 21 (!) 22 (!) 24 (!) 21  Temp: 98.1 F (36.7 C)  97.9 F (36.6 C) 98 F (36.7 C)  TempSrc: Oral  Oral Axillary  SpO2: 98% 95% 95% 97%  Weight:      Height:        Intake/Output Summary (Last 24 hours) at 03/26/2021 1033 Last data filed at 03/26/2021 0855 Gross per 24 hour  Intake 2427.93 ml  Output 8550 ml  Net -6122.07 ml   Filed Weights   03/07/21 0342 03/10/21 0447 03/16/21 0343  Weight: 94 kg 102 kg 98.9 kg    Examination:    Awake Alert, Oriented X 3, No new F.N deficits, Normal affect Symmetrical Chest wall movement, Good air movement bilaterally, CTAB RRR,No Gallops,Rubs or new Murmurs, No Parasternal Heave All sounds present today, colostomy with liquid output, wound VAC in lower abdomen surgical wound. No Cyanosis, Clubbing, anasarca with significant +3 lower extremity edema, right upper> left upper edema.  Data Reviewed: I have personally reviewed following labs and imaging studies  CBC: Recent Labs  Lab 03/20/21 0439 03/23/21 0437 03/24/21 1300 03/25/21 1043 03/26/21 0450  WBC 11.7* 9.2 8.0 8.9 7.6  HGB 8.1* 7.4* 7.7* 8.4* 7.9*  HCT 26.0* 23.3* 25.2* 27.1* 25.5*  MCV 101.2* 97.5 98.8 99.3 98.1  PLT 198 281 346 371 846   Basic Metabolic Panel: Recent Labs  Lab 03/20/21 0439 03/21/21 0406 03/23/21 0437 03/24/21 0439 03/25/21 1043 03/26/21 0450  NA 142 139 135 138 136 134*  K 3.7 3.8 3.7 3.7 4.1 4.0  CL 108 105 99 100 102 101  CO2 26 27 28 28 29 27   GLUCOSE 144* 245* 203* 177* 114* 170*  BUN 28* 33* 42* 44* 35* 30*  CREATININE 1.14 1.07 1.01 1.19 1.02 1.03  CALCIUM 7.8* 7.7* 7.8* 8.3* 7.9* 7.7*  MG 2.2 2.3  --  2.3  --  2.1  PHOS  --  3.3  --  4.5  --  3.4   GFR: Estimated Creatinine Clearance: 81.3 mL/min (by C-G formula based on SCr of 1.03 mg/dL). Liver Function Tests: Recent Labs  Lab 03/21/21 0406 03/24/21 0439   AST 27 33  ALT 31 47*  ALKPHOS 75 88  BILITOT 0.5 0.6  PROT 5.6* 6.1*  ALBUMIN <1.5* 1.7*   No results for input(s): LIPASE, AMYLASE in the last 168 hours. No results for input(s): AMMONIA in the last 168 hours. Coagulation Profile: No results for input(s): INR, PROTIME in the last 168 hours. Cardiac Enzymes: No results for input(s): CKTOTAL, CKMB, CKMBINDEX, TROPONINI in the last 168 hours. BNP (last 3 results) No results for input(s): PROBNP in the last 8760 hours. HbA1C: No results for input(s): HGBA1C in the last 72 hours. CBG: Recent Labs  Lab 03/25/21 1716 03/25/21 2009 03/25/21 2322 03/26/21 0440 03/26/21 0846  GLUCAP 133* 175* 150* 157* 143*   Lipid Profile: Recent Labs    03/24/21 0439  TRIG 198*   Thyroid Function Tests: No results for input(s): TSH, T4TOTAL, FREET4, T3FREE, THYROIDAB in the last 72 hours. Anemia Panel: No results for input(s): VITAMINB12, FOLATE, FERRITIN, TIBC, IRON, RETICCTPCT in the last 72 hours.   Radiology Studies: I have reviewed all of the imaging during this hospital visit personally  Scheduled Meds:  (feeding supplement) PROSource Plus  30 mL Oral BID BM   acetaminophen  650 mg Oral Q6H   amiodarone  200 mg Oral Daily   Chlorhexidine Gluconate Cloth  6 each Topical Daily   diltiazem  30 mg Oral Q6H   docusate sodium  100 mg Oral BID   enoxaparin (LOVENOX) injection  100 mg Subcutaneous Q12H   feeding supplement  237 mL Oral BID BM   furosemide  40 mg Intravenous BID   insulin aspart  0-15 Units Subcutaneous Q4H   ketorolac  15 mg Intravenous Q6H   lidocaine  1 patch Transdermal Q24H   lidocaine  1 patch Transdermal Q24H   methocarbamol  1,000 mg Oral QID   metoCLOPramide (REGLAN) injection  10 mg Intravenous Q6H   nicotine  21 mg Transdermal Daily   pantoprazole (PROTONIX) IV  40 mg Intravenous Q12H   potassium chloride  20 mEq Oral Once   sodium chloride flush  10 mL Intracatheter Q12H   Continuous Infusions:   sodium chloride Stopped (03/24/21 2259)   diltiazem (CARDIZEM) infusion 5 mg/hr (03/26/21 0152)   TPN ADULT (ION) 85 mL/hr at 03/25/21 1748   TPN ADULT (ION)       LOS: 34 days   Phillips Climes, MD [facility] Hospitalist

## 2021-03-26 NOTE — Progress Notes (Signed)
PHARMACY - TOTAL PARENTERAL NUTRITION CONSULT NOTE  Indication: Prolonged ileus  Patient Measurements: Height: 6' (182.9 cm) Weight: 98.9 kg (218 lb 0.6 oz) IBW/kg (Calculated) : 77.6 TPN AdjBW (KG): 82.6 Body mass index is 29.57 kg/m.  Assessment:  79 YOM with lower abdominal pain/N/V found to have perforated diverticulitis. CT abd on 9/3 found to have active ileus/pSBO from diverticulitis/bowel inflammation. Pharmacy consulted to manage TPN for prolonged ileus. Of note patient has a history pf alcohol dependency and is at high risk for refeeding.   Glucose / Insulin: no hx DM, A1c 5.9% - CBGs < 180.  Off steroid on 10/5. Used 15 units SSI in past 24 hrs, 30 units insulin in TPN.  Electrolytes: Na 134 (max in TPN), K 4 (Lasix increased to 40mg  IV BID on 10/7, KCL 7mEq PO on 10/7), others WNL Renal: SCr down 1.03, BUN down to 30 Hepatic: LFTs / tbili  WNL, TG 68 (9/26) >> 198 (ILE 20% of kCal), albumin 1.7 Intake / Output; MIVF: UOP 3.4 ml/kg/hr with Lasix, net -12L per charting, LBM 10/6 GI Imaging:  - 9/3 CT abd: Active ileus/PSBO - 9/16 CT abd: L-iliopsoas fluid collection (abscess or hematoma), resolution of prior diverticular abscess, still w/ evidence of ileus - 9/27 CT abd: continued retroperitoneal complex fluid collection, SBO - 10/4 CT: Left iliopsoas enlargement heterogeneity persists > may represent intramuscular hematoma, infection or mass.  Ieus, degree of small-bowel distention has decreased, pSBO cannot be excluded. GI Surgeries / Procedures:  - 9/10 ex-lap, sigmoid colectomy w/ end colostomy creation, enterorrhaphy, drainage of intra-abd abscess, wound vac placement  Central access: PICC line 02/24/21 TPN start date: 02/25/21  Nutritional Goals, RD Estimated Needs Total Energy Estimated Needs: 2000-2200 Total Protein Estimated Needs: 100-110 grams Total Fluid Estimated Needs: >2L  Current Nutrition:  TPN CLD started 10/6 - minimal intake Prosource BID - 1 charted  given yesterday Resource Breeze TID - 2 charted given yesterday  Plan:  Concentrate TPN per discussion with MD, increased Lasix needs TPN at 70 ml/hr will provide 105g AA, 353g CHO and 41g ILE for a total of 2032 kCal, meeting 100% of patient's needs Electrolytes in TPN: Na 142mEq/L, K 1mEq/L, Ca 67mEq/L, Mg 5mEq/L, Phos 29mmol/L, Cl:Ac 1:2 for now Add standard MVI, trace elements, folate and thiamine to TPN Continue moderate SSI Q4H and 30 units regular insulin in TPN Repeat KCL 74mEq PO with less in TPN - may need to schedule if to remain on Lasix Standard TPN labs on Mon and Thurs >> BMET in AM F/U PO intake/diet advancement, Lasix/lytes, CBGs with slightly less CHO in TPN  Madhuri Vacca D. Mina Marble, PharmD, BCPS, [place] 03/26/2021, 10:25 AM

## 2021-03-26 NOTE — Progress Notes (Signed)
Progress Note  28 Days Post-Op  Subjective: Patient reports he has had a hard time with keeping track of time. He is tolerating CLD and denies nausea or vomiting. Having bowel function. He did not get up yesterday with A. Fib.   Objective: Vital signs in last 24 hours: Temp:  [97.8 F (36.6 C)-98.1 F (36.7 C)] 98 F (36.7 C) (10/08 0800) Pulse Rate:  [88-132] 91 (10/08 0800) Resp:  [18-24] 21 (10/08 0800) BP: (105-151)/(59-83) 105/80 (10/08 0800) SpO2:  [95 %-98 %] 97 % (10/08 0800) Last BM Date: 03/25/21  Intake/Output from previous day: 10/07 0701 - 10/08 0700 In: 2427.9 [P.O.:480; I.V.:1522.7; IV Piggyback:425.3] Out: 8000 [Urine:8000] Intake/Output this shift: No intake/output data recorded.  PE: Gen:  Alert, NAD, pleasant Pulm:  Normal rate and effort.  Abd: Soft, ND, NT, +BS. Stoma appears viable. Dark mucus like stool present in ostomy appliance. midline wound with VAC present   Lab Results:  Recent Labs    03/25/21 1043 03/26/21 0450  WBC 8.9 7.6  HGB 8.4* 7.9*  HCT 27.1* 25.5*  PLT 371 356   BMET Recent Labs    03/25/21 1043 03/26/21 0450  NA 136 134*  K 4.1 4.0  CL 102 101  CO2 29 27  GLUCOSE 114* 170*  BUN 35* 30*  CREATININE 1.02 1.03  CALCIUM 7.9* 7.7*   PT/INR No results for input(s): LABPROT, INR in the last 72 hours. CMP     Component Value Date/Time   NA 134 (L) 03/26/2021 0450   NA 134 09/30/2019 0959   NA 142 03/03/2016 0921   K 4.0 03/26/2021 0450   K 4.3 03/03/2016 0921   CL 101 03/26/2021 0450   CO2 27 03/26/2021 0450   CO2 25 03/03/2016 0921   GLUCOSE 170 (H) 03/26/2021 0450   GLUCOSE 89 03/03/2016 0921   BUN 30 (H) 03/26/2021 0450   BUN 11 09/30/2019 0959   BUN 13.6 03/03/2016 0921   CREATININE 1.03 03/26/2021 0450   CREATININE 1.2 03/03/2016 0921   CALCIUM 7.7 (L) 03/26/2021 0450   CALCIUM 9.6 03/03/2016 0921   PROT 6.1 (L) 03/24/2021 0439   PROT 7.3 03/03/2016 0921   ALBUMIN 1.7 (L) 03/24/2021 0439    ALBUMIN 3.8 03/03/2016 0921   AST 33 03/24/2021 0439   AST 36 (H) 03/03/2016 0921   ALT 47 (H) 03/24/2021 0439   ALT 31 03/03/2016 0921   ALKPHOS 88 03/24/2021 0439   ALKPHOS 116 03/03/2016 0921   BILITOT 0.6 03/24/2021 0439   BILITOT 0.71 03/03/2016 0921   GFRNONAA >60 03/26/2021 0450   GFRNONAA 67 07/25/2013 1712   GFRAA >60 10/03/2019 0930   GFRAA 77 07/25/2013 1712   Lipase     Component Value Date/Time   LIPASE 22 02/19/2021 1653       Studies/Results: DG CHEST PORT 1 VIEW  Result Date: 03/25/2021 CLINICAL DATA:  Small bowel obstruction.  Colostomy present. EXAM: PORTABLE CHEST 1 VIEW; portable one-view abdomen COMPARISON:  One-view abdomen 03/17/2021 one-view chest x-ray 03/20/2021 FINDINGS: Heart size is normal. Interstitial and airspace opacities are again noted at the lung bases, right greater than left. Right PICC line is in place. The NG tube was removed. Dilated loops of small bowel are slightly more prominent than on the prior study. Gas is noted within the colon. Degenerative changes are present in the lumbar spine. IMPRESSION: 1. Stable interstitial and airspace opacities at the lung bases, right greater than left. 2. Dilated loops of small bowel  are slightly more prominent than on the prior study. Gas is noted within the colon. Electronically Signed   By: San Morelle M.D.   On: 03/25/2021 14:35   DG Abd Portable 1V  Result Date: 03/25/2021 CLINICAL DATA:  Small bowel obstruction.  Colostomy present. EXAM: PORTABLE CHEST 1 VIEW; portable one-view abdomen COMPARISON:  One-view abdomen 03/17/2021 one-view chest x-ray 03/20/2021 FINDINGS: Heart size is normal. Interstitial and airspace opacities are again noted at the lung bases, right greater than left. Right PICC line is in place. The NG tube was removed. Dilated loops of small bowel are slightly more prominent than on the prior study. Gas is noted within the colon. Degenerative changes are present in the lumbar  spine. IMPRESSION: 1. Stable interstitial and airspace opacities at the lung bases, right greater than left. 2. Dilated loops of small bowel are slightly more prominent than on the prior study. Gas is noted within the colon. Electronically Signed   By: San Morelle M.D.   On: 03/25/2021 14:35   VAS [place] UPPER EXTREMITY VENOUS DUPLEX  Result Date: 03/24/2021 UPPER VENOUS STUDY  Patient Name:  Travis Conrad  Date of Exam:   03/24/2021 Medical Rec #: [id number]         Accession #:    [id number] Date of Birth: Sep 10, 1950        Patient Gender: M Patient Age:   4 years Exam Location:  [facility] Procedure:      VAS [place] UPPER EXTREMITY VENOUS DUPLEX Referring Phys: RAVI PAHWANI --------------------------------------------------------------------------------  Indications: Edema Risk Factors: None identified. Limitations: Poor ultrasound/tissue interface, bandages and line. Comparison Study: No prior studies. Performing Technologist: Oliver Hum RVT  Examination Guidelines: A complete evaluation includes B-mode imaging, spectral Doppler, color Doppler, and power Doppler as needed of all accessible portions of each vessel. Bilateral testing is considered an integral part of a complete examination. Limited examinations for reoccurring indications may be performed as noted.  Right Findings: +----------+------------+---------+-----------+----------+-------+ RIGHT     CompressiblePhasicitySpontaneousPropertiesSummary +----------+------------+---------+-----------+----------+-------+ IJV           Full       Yes       Yes                      +----------+------------+---------+-----------+----------+-------+ Subclavian    Full       Yes       Yes                      +----------+------------+---------+-----------+----------+-------+ Axillary      Full       Yes       Yes                      +----------+------------+---------+-----------+----------+-------+ Brachial      Full        Yes       Yes                      +----------+------------+---------+-----------+----------+-------+ Radial        Full                                          +----------+------------+---------+-----------+----------+-------+ Ulnar         Full                                          +----------+------------+---------+-----------+----------+-------+  Cephalic      Full                                          +----------+------------+---------+-----------+----------+-------+ Basilic       Full                                          +----------+------------+---------+-----------+----------+-------+  Left Findings: +----------+------------+---------+-----------+----------+-------+ LEFT      CompressiblePhasicitySpontaneousPropertiesSummary +----------+------------+---------+-----------+----------+-------+ Subclavian    Full       Yes       Yes                      +----------+------------+---------+-----------+----------+-------+  Summary:  Right: No evidence of deep vein thrombosis in the upper extremity. No evidence of superficial vein thrombosis in the upper extremity.  Left: No evidence of thrombosis in the subclavian.  *See table(s) above for measurements and observations.  Diagnosing physician: Jamelle Haring Electronically signed by Jamelle Haring on 03/24/2021 at 6:24:59 PM.    Final     Anti-infectives: Anti-infectives (From admission, onward)    Start     Dose/Rate Route Frequency Ordered Stop   03/09/21 0800  piperacillin-tazobactam (ZOSYN) IVPB 3.375 g        3.375 g 12.5 mL/hr over 240 Minutes Intravenous Every 8 hours 03/09/21 0706 03/25/21 2034   02/26/21 0100  doxycycline (VIBRAMYCIN) 100 mg in sodium chloride 0.9 % 250 mL IVPB        100 mg 125 mL/hr over 120 Minutes Intravenous 2 times daily 02/26/21 0005 03/08/21 2359   02/20/21 0300  piperacillin-tazobactam (ZOSYN) IVPB 3.375 g        3.375 g 12.5 mL/hr over 240 Minutes  Intravenous Every 8 hours 02/19/21 1907 03/08/21 2359   02/19/21 1900  ceFEPIme (MAXIPIME) 2 g in sodium chloride 0.9 % 100 mL IVPB  Status:  Discontinued        2 g 200 mL/hr over 30 Minutes Intravenous  Once 02/19/21 1857 02/19/21 1906   02/19/21 1900  metroNIDAZOLE (FLAGYL) IVPB 500 mg  Status:  Discontinued        500 mg 100 mL/hr over 60 Minutes Intravenous  Once 02/19/21 1857 02/19/21 1906   02/19/21 1815  piperacillin-tazobactam (ZOSYN) IVPB 3.375 g        3.375 g 100 mL/hr over 30 Minutes Intravenous  Once 02/19/21 1809 02/19/21 1944        Assessment/Plan POD 28 s/p ex lap, sigmoid colectomy, end colostomy with enterorrhaphy and drainage of intraabdominal abscess - Dr. Kieth Brightly and Dr. Radene Knee 9/10 for Acute sigmoid diverticulitis with contained perforation and abscess - CT 9/16 demonstrates heterogenous left iliopsoas collection likely hematoma, stranding in the retroperitoneum and subcu tissue.  Distal colon appears decompressed and very small caliber but there is contrast entering this area; there is additionally a similarly small-caliber segment of colon in the proximal transverse and the ascending colon is not dilated- suspect over all this represents small bowel/gastric ileus.  IR does not recommend draining the hematoma at this point. ID following and recommending Zosyn through 10/17  - NGT replaced 9/27  - CT 9/27 with continued complex fluid collection along left psoas, SBO with transition in mid jejunum, post-surgical changes of colon resection with colostomy - Was started on reglan  last weekend. Discussed with him the risk of tardive dyskinesia/EPS on 9/20. Patient stated understanding and is okay with continuing Reglan.  - Gastrograffin study 9/28 showed Stable gaseous distention of the small bowel, oral contrast progressed into the colon - CT 10/4 with ileus although improved distention of small bowel, stable psoas collection - NGT fell out 10/5 and patient has tolerated  it being out on CLD but only taking in small amts, keep on CLD as tolerated - Add ensure today, possibly up diet to FLD tomorrow  - WOCN following for new ostomy teaching and vac changes - continue VAC through the weekend, but wound is much shallower and likely will DC vac next week - Cont TPN - Encouraged ambulation (working with therapies), use IS, multimodal pain control  - Cont abx per ID recs   FEN - CLD, TPN VTE - SCDs, therapeutic lovenox  ID - Zosyn 9/4 - 10/17 (per ID) Foley - out, voiding.    Per primary: A fib - cards following  AKI - resolved ABL anemia -  stable HTN ETOH use L knee pain - per ortho  LOS: 34 days    Norm Parcel, [facility] 03/26/2021, 9:09 AM Please see Amion for pager number during day hours 7:00am-4:30pm

## 2021-03-27 DIAGNOSIS — K5720 Diverticulitis of large intestine with perforation and abscess without bleeding: Secondary | ICD-10-CM | POA: Diagnosis not present

## 2021-03-27 DIAGNOSIS — I482 Chronic atrial fibrillation, unspecified: Secondary | ICD-10-CM | POA: Diagnosis not present

## 2021-03-27 LAB — CBC
HCT: 24.3 % — ABNORMAL LOW (ref 39.0–52.0)
Hemoglobin: 7.8 g/dL — ABNORMAL LOW (ref 13.0–17.0)
MCH: 31.1 pg (ref 26.0–34.0)
MCHC: 32.1 g/dL (ref 30.0–36.0)
MCV: 96.8 fL (ref 80.0–100.0)
Platelets: 360 10*3/uL (ref 150–400)
RBC: 2.51 MIL/uL — ABNORMAL LOW (ref 4.22–5.81)
RDW: 15.2 % (ref 11.5–15.5)
WBC: 7.5 10*3/uL (ref 4.0–10.5)
nRBC: 0 % (ref 0.0–0.2)

## 2021-03-27 LAB — GLUCOSE, CAPILLARY
Glucose-Capillary: 144 mg/dL — ABNORMAL HIGH (ref 70–99)
Glucose-Capillary: 145 mg/dL — ABNORMAL HIGH (ref 70–99)
Glucose-Capillary: 146 mg/dL — ABNORMAL HIGH (ref 70–99)
Glucose-Capillary: 149 mg/dL — ABNORMAL HIGH (ref 70–99)
Glucose-Capillary: 155 mg/dL — ABNORMAL HIGH (ref 70–99)
Glucose-Capillary: 171 mg/dL — ABNORMAL HIGH (ref 70–99)

## 2021-03-27 LAB — BASIC METABOLIC PANEL
Anion gap: 8 (ref 5–15)
BUN: 31 mg/dL — ABNORMAL HIGH (ref 8–23)
CO2: 26 mmol/L (ref 22–32)
Calcium: 7.9 mg/dL — ABNORMAL LOW (ref 8.9–10.3)
Chloride: 101 mmol/L (ref 98–111)
Creatinine, Ser: 1.06 mg/dL (ref 0.61–1.24)
GFR, Estimated: >60 mL/min (ref >60)
Glucose, Bld: 141 mg/dL — ABNORMAL HIGH (ref 70–99)
Potassium: 4.3 mmol/L (ref 3.5–5.1)
Sodium: 135 mmol/L (ref 135–145)

## 2021-03-27 MED ORDER — ALTEPLASE 2 MG IJ SOLR
2.0000 mg | Freq: Once | INTRAMUSCULAR | Status: AC
  Administered 2021-03-27: 2 mg
  Filled 2021-03-27: qty 2

## 2021-03-27 MED ORDER — DILTIAZEM 12 MG/ML ORAL SUSPENSION
30.0000 mg | Freq: Once | ORAL | Status: AC
  Administered 2021-03-27: 30 mg via ORAL
  Filled 2021-03-27: qty 3

## 2021-03-27 MED ORDER — DILTIAZEM HCL 25 MG/5ML IV SOLN
10.0000 mg | Freq: Once | INTRAVENOUS | Status: AC
  Administered 2021-03-27: 10 mg via INTRAVENOUS
  Filled 2021-03-27: qty 5

## 2021-03-27 MED ORDER — DILTIAZEM LOAD VIA INFUSION
10.0000 mg | Freq: Once | INTRAVENOUS | Status: DC
  Filled 2021-03-27: qty 10

## 2021-03-27 MED ORDER — ENSURE SURGERY PO LIQD
237.0000 mL | Freq: Three times a day (TID) | ORAL | Status: DC
  Administered 2021-03-27 – 2021-03-28 (×3): 237 mL via ORAL
  Filled 2021-03-27 (×5): qty 237

## 2021-03-27 MED ORDER — TRACE MINERALS CU-MN-SE-ZN 300-55-60-3000 MCG/ML IV SOLN
INTRAVENOUS | Status: AC
  Filled 2021-03-27: qty 700

## 2021-03-27 NOTE — Progress Notes (Signed)
Progress Note  29 Days Post-Op  Subjective: Pt getting up to chair this AM. Drank 1.5 ensure yesterday but still only taking limited amts. Increased ostomy output  Objective: Vital signs in last 24 hours: Temp:  [97.5 F (36.4 C)-98.5 F (36.9 C)] 97.6 F (36.4 C) (10/09 0741) Pulse Rate:  [88-117] 88 (10/09 0741) Resp:  [15-25] 21 (10/09 0741) BP: (124-154)/(56-67) 143/56 (10/09 0741) SpO2:  [93 %-97 %] 96 % (10/09 0741) Last BM Date: 03/26/21  Intake/Output from previous day: 10/08 0701 - 10/09 0700 In: -  Out: 4750 [Urine:4750] Intake/Output this shift: No intake/output data recorded.  PE: Gen:  Alert, NAD, pleasant Pulm:  Normal rate and effort.  Abd: Soft, ND, NT, +BS. Stoma appears viable. liquid stool present in ostomy appliance. midline wound with VAC present   Lab Results:  Recent Labs    03/26/21 0450 03/27/21 0148  WBC 7.6 7.5  HGB 7.9* 7.8*  HCT 25.5* 24.3*  PLT 356 360   BMET Recent Labs    03/26/21 0450 03/27/21 0148  NA 134* 135  K 4.0 4.3  CL 101 101  CO2 27 26  GLUCOSE 170* 141*  BUN 30* 31*  CREATININE 1.03 1.06  CALCIUM 7.7* 7.9*   PT/INR No results for input(s): LABPROT, INR in the last 72 hours. CMP     Component Value Date/Time   NA 135 03/27/2021 0148   NA 134 09/30/2019 0959   NA 142 03/03/2016 0921   K 4.3 03/27/2021 0148   K 4.3 03/03/2016 0921   CL 101 03/27/2021 0148   CO2 26 03/27/2021 0148   CO2 25 03/03/2016 0921   GLUCOSE 141 (H) 03/27/2021 0148   GLUCOSE 89 03/03/2016 0921   BUN 31 (H) 03/27/2021 0148   BUN 11 09/30/2019 0959   BUN 13.6 03/03/2016 0921   CREATININE 1.06 03/27/2021 0148   CREATININE 1.2 03/03/2016 0921   CALCIUM 7.9 (L) 03/27/2021 0148   CALCIUM 9.6 03/03/2016 0921   PROT 6.1 (L) 03/24/2021 0439   PROT 7.3 03/03/2016 0921   ALBUMIN 1.7 (L) 03/24/2021 0439   ALBUMIN 3.8 03/03/2016 0921   AST 33 03/24/2021 0439   AST 36 (H) 03/03/2016 0921   ALT 47 (H) 03/24/2021 0439   ALT 31  03/03/2016 0921   ALKPHOS 88 03/24/2021 0439   ALKPHOS 116 03/03/2016 0921   BILITOT 0.6 03/24/2021 0439   BILITOT 0.71 03/03/2016 0921   GFRNONAA >60 03/27/2021 0148   GFRNONAA 67 07/25/2013 1712   GFRAA >60 10/03/2019 0930   GFRAA 77 07/25/2013 1712   Lipase     Component Value Date/Time   LIPASE 22 02/19/2021 1653       Studies/Results: DG CHEST PORT 1 VIEW  Result Date: 03/25/2021 CLINICAL DATA:  Small bowel obstruction.  Colostomy present. EXAM: PORTABLE CHEST 1 VIEW; portable one-view abdomen COMPARISON:  One-view abdomen 03/17/2021 one-view chest x-ray 03/20/2021 FINDINGS: Heart size is normal. Interstitial and airspace opacities are again noted at the lung bases, right greater than left. Right PICC line is in place. The NG tube was removed. Dilated loops of small bowel are slightly more prominent than on the prior study. Gas is noted within the colon. Degenerative changes are present in the lumbar spine. IMPRESSION: 1. Stable interstitial and airspace opacities at the lung bases, right greater than left. 2. Dilated loops of small bowel are slightly more prominent than on the prior study. Gas is noted within the colon. Electronically Signed   By: Harrell Gave  Mattern M.D.   On: 03/25/2021 14:35   DG Abd Portable 1V  Result Date: 03/25/2021 CLINICAL DATA:  Small bowel obstruction.  Colostomy present. EXAM: PORTABLE CHEST 1 VIEW; portable one-view abdomen COMPARISON:  One-view abdomen 03/17/2021 one-view chest x-ray 03/20/2021 FINDINGS: Heart size is normal. Interstitial and airspace opacities are again noted at the lung bases, right greater than left. Right PICC line is in place. The NG tube was removed. Dilated loops of small bowel are slightly more prominent than on the prior study. Gas is noted within the colon. Degenerative changes are present in the lumbar spine. IMPRESSION: 1. Stable interstitial and airspace opacities at the lung bases, right greater than left. 2. Dilated loops  of small bowel are slightly more prominent than on the prior study. Gas is noted within the colon. Electronically Signed   By: San Morelle M.D.   On: 03/25/2021 14:35    Anti-infectives: Anti-infectives (From admission, onward)    Start     Dose/Rate Route Frequency Ordered Stop   03/09/21 0800  piperacillin-tazobactam (ZOSYN) IVPB 3.375 g        3.375 g 12.5 mL/hr over 240 Minutes Intravenous Every 8 hours 03/09/21 0706 03/25/21 2034   02/26/21 0100  doxycycline (VIBRAMYCIN) 100 mg in sodium chloride 0.9 % 250 mL IVPB        100 mg 125 mL/hr over 120 Minutes Intravenous 2 times daily 02/26/21 0005 03/08/21 2359   02/20/21 0300  piperacillin-tazobactam (ZOSYN) IVPB 3.375 g        3.375 g 12.5 mL/hr over 240 Minutes Intravenous Every 8 hours 02/19/21 1907 03/08/21 2359   02/19/21 1900  ceFEPIme (MAXIPIME) 2 g in sodium chloride 0.9 % 100 mL IVPB  Status:  Discontinued        2 g 200 mL/hr over 30 Minutes Intravenous  Once 02/19/21 1857 02/19/21 1906   02/19/21 1900  metroNIDAZOLE (FLAGYL) IVPB 500 mg  Status:  Discontinued        500 mg 100 mL/hr over 60 Minutes Intravenous  Once 02/19/21 1857 02/19/21 1906   02/19/21 1815  piperacillin-tazobactam (ZOSYN) IVPB 3.375 g        3.375 g 100 mL/hr over 30 Minutes Intravenous  Once 02/19/21 1809 02/19/21 1944        Assessment/Plan POD 29 s/p ex lap, sigmoid colectomy, end colostomy with enterorrhaphy and drainage of intraabdominal abscess - Dr. Kieth Brightly and Dr. Radene Knee 9/10 for Acute sigmoid diverticulitis with contained perforation and abscess - CT 9/16 demonstrates heterogenous left iliopsoas collection likely hematoma, stranding in the retroperitoneum and subcu tissue.  Distal colon appears decompressed and very small caliber but there is contrast entering this area; there is additionally a similarly small-caliber segment of colon in the proximal transverse and the ascending colon is not dilated- suspect over all this represents  small bowel/gastric ileus.  IR does not recommend draining the hematoma at this point. ID following and recommending Zosyn through 10/17  - NGT replaced 9/27  - CT 9/27 with continued complex fluid collection along left psoas, SBO with transition in mid jejunum, post-surgical changes of colon resection with colostomy - Was started on reglan last weekend. Discussed with him the risk of tardive dyskinesia/EPS on 9/20. Patient stated understanding and is okay with continuing Reglan.  - Gastrograffin study 9/28 showed Stable gaseous distention of the small bowel, oral contrast progressed into the colon - CT 10/4 with ileus although improved distention of small bowel, stable psoas collection - NGT fell out 10/5 and  patient has tolerated it being out on CLD but only taking in small amts, keep on CLD as tolerated - Continue ensure as tolerated  - WOCN following for new ostomy teaching and vac changes - continue VAC through the weekend, but wound is much shallower and likely will DC vac next week - Cont TPN - Encouraged ambulation (working with therapies), use IS, multimodal pain control  - Cont abx per ID recs   FEN - CLD, TPN, ensure VTE - SCDs, therapeutic lovenox  ID - Zosyn 9/4 - 10/17 (per ID) Foley - out, voiding.    Per primary: A fib - cards following  AKI - resolved ABL anemia -  stable HTN ETOH use L knee pain - per ortho  LOS: 35 days    Norm Parcel, [facility] 03/27/2021, 10:06 AM Please see Amion for pager number during day hours 7:00am-4:30pm

## 2021-03-27 NOTE — Progress Notes (Signed)
PROGRESS NOTE    COUNCIL MUNGUIA  ZOX:[id number] DOB: November 27, 1950 DOA: 02/19/2021 PCP: [name], MD    Brief Narrative:   Mr. Porcaro was admitted to the hospital with the working diagnosis of severe sepsis due to perforated diverticulitis/ intra-abdominal abscess. Now sp lapartomy, with colectomy, complicated with postoperative ileus.   70 year old male past medical history for atrial fibrillation, dyslipidemia, hypertension and diverticulosis who presented with nausea, vomiting and abdominal pain.  Reported several days of worse abdominal pain, and poor oral intake.  Because of severe symptoms he presented to the hospital.  On his initial physical examination his blood pressure was 84/65, heart rate 57, respiratory rate 20, oxygen saturation 91%, he was ill-looking appearing, heart S1-S2, present, irregular irregular, lungs clear to auscultation, abdomen tender to palpation in the mid abdomen, no lower extremity edema.   CT of the abdomen and pelvis with acute sigmoid diverticulitis with small extraluminal gas and fluid collection in the sigmoid mesocolon, 3.5 x 1.5 x 2.7 cm consistent with perforation and abscess.  Dilated proximal and decompressed distal small bowel loops, possible ileus.   9/4 developed atrial fibrillation with rapid ventricular response, required intravenous amiodarone infusion.   Follow-up CT 9/7 with increased size pelvic abscess. On 9/8 patient underwent CT-guided drainage of pelvic abscess.   09/10 Patient underwent exploratory laparotomy with sigmoid colectomy and diverting colostomy placement.  Postoperative ileus.   Urology and ID have been consulted for epididymitis.   9/19 Postoperative anemia: Required 1 unit packed red blood cells.    Positive left knee pain, positive effusion, underwent left knee aspiration and injection with good toleration.  Fluid with no crystals, gram stain no organisms.      09/26 NG tube has been removed.   09/27 patient with  vomiting and abdominal distention, abdominal radiographs with recurrent ileus, NG tube has replaced and connected to low intermittent suction.   10/06 Patient started on clear liquid diet   Assessment & Plan:   Principal Problem:   Diverticulitis of large intestine with perforation and abscess without bleeding Active Problems:   Diverticulitis of intestine with abscess   Atrial fibrillation, chronic (HCC)   Essential hypertension   Diverticulitis   AKI (acute kidney injury) ([place])   Severe sepsis with acute organ dysfunction (HCC)   Intra-abdominal abscess (HCC)   Epididymitis   Severe sepsis due to perforated diverticulitis, sp colectomy. Left psoas collection. Post operative ileus.   - CT 03/15/2021 shows continued complex fluid collection along left iliopsoas.  SBO with transition in mid jejunum.  Surgery consulted IR to evaluate for drainage of the left psoas abscess however, IR do not recommend aspirating at this point in time.  No pain today.  S/p Gastrografin study on 03/16/2021.  Patient being managed by intermittent NG tube clamping.  Remains on TPN.   -Antibiotics management per ID, completed Zosyn 10/7 -  CT 10/4 with ileus although improved distention of small bowel, stable psoas collection.     - Patient with persistent/ recurrent ileus,   Ileus managed by general surgery. NG tube on suction for couple times already, NGT is discontinued, and has been advanced to full liquid diet, and he is tolerating his Ensure, it will be increased to 3 times daily, he remains on TPN (concentrated due to volume overload).  Post operative anemia/anemia of chronic disease. - sp 1 unit PRBC on 03/07/2021. -Transfuse for hemoglobin less than 7.  Paroxysmal atrial fibrillation/ HTN. Patient was on amiodarone and metoprolol with good rate control.  On enoxaparin for anticoagulation, holding DOAC until po intake more consistent.   -He is off Cardizem drip currently, heart rate controlled on  amiodarone and p.o. Cardizem.  . - Due to significant wheezes, IV metoprolol was also discontinued on 03/21/2021.     Right epididymitis resolved, completed therapy with doxycycline.    AKI with hypokalemia/ hyperkalemia,hyponatremia and hypophosphatemia.  Continue TPN per pharmacy protocol.  All electrolytes normal with AKI resolved.   Alcohol withdrawal.  Resolved.    Left knee arthritis.  \Clinically improved after steroid injection.  Pain is well controlled.   Essential hypertension:  Controlled. [name]  Dyspnea/atelectasis acute hypoxic respiratory failure:  - Continues to improve and feels much better.   - No more wheezes.  Off of oxygen.  Off steroids . - Continue to hold metoprolol.  Will reconsider resuming in next 1 to 2 days with lower dose, likely orally if he continues to do well with p.o. intake  Anasarca  -Volume status improving with concentrated TPN and IV Lasix.      Status is: Inpatient  Remains inpatient appropriate because:Inpatient level of care appropriate due to severity of illness  Dispo: The patient is from: Home              Anticipated d/c is to: CIR              Patient currently is not medically stable to d/c.  Will be ready when cleared by general surgery   Difficult to place patient No   DVT prophylaxis: Enoxaparin   Code Status:    full  Family Communication: wife at bedside    Nutrition Status: Nutrition Problem: Inadequate oral intake Etiology: acute illness (diverticulitis) Signs/Symptoms: per patient/family report, NPO status Interventions: Prostat, Boost Plus    Consultants:  Surgery  ID Urology Cardiology orthopedic  Subjective:  No significant events overnight as discussed with staff, patient himself denies any complaints.  Objective: Vitals:   03/26/21 1945 03/26/21 2307 03/27/21 0423 03/27/21 0741  BP: 124/61 137/66 133/67 (!) 143/56  Pulse:    88  Resp: 19 17  (!) 21  Temp: 98.5 F (36.9 C) (!) 97.5 F (36.4  C) 98 F (36.7 C) 97.6 F (36.4 C)  TempSrc: Oral Oral Oral Oral  SpO2: 97% 95% 94% 96%  Weight:      Height:        Intake/Output Summary (Last 24 hours) at 03/27/2021 1211 Last data filed at 03/27/2021 1115 Gross per 24 hour  Intake --  Output 6600 ml  Net -6600 ml   Filed Weights   03/07/21 0342 03/10/21 0447 03/16/21 0343  Weight: 94 kg 102 kg 98.9 kg    Examination:   Awake Alert, Oriented X 3, is mildly confused today Symmetrical Chest wall movement, diminished at the bases RRR,No Gallops,Rubs or new Murmurs, No Parasternal Heave +ve B.Sounds, Abd Soft, No tenderness, colostomy with clear liquids, lower abdominal surgical wound with wound VAC.   Anasarca significantly improved, no upper extremity edema, lower extremity +2 edema bilaterally   Data Reviewed: I have personally reviewed following labs and imaging studies  CBC: Recent Labs  Lab 03/23/21 0437 03/24/21 1300 03/25/21 1043 03/26/21 0450 03/27/21 0148  WBC 9.2 8.0 8.9 7.6 7.5  HGB 7.4* 7.7* 8.4* 7.9* 7.8*  HCT 23.3* 25.2* 27.1* 25.5* 24.3*  MCV 97.5 98.8 99.3 98.1 96.8  PLT 281 346 371 356 329   Basic Metabolic Panel: Recent Labs  Lab 03/21/21 0406 03/23/21 0437 03/24/21 0439 03/25/21  1043 03/26/21 0450 03/27/21 0148  NA 139 135 138 136 134* 135  K 3.8 3.7 3.7 4.1 4.0 4.3  CL 105 99 100 102 101 101  CO2 27 28 28 29 27 26   GLUCOSE 245* 203* 177* 114* 170* 141*  BUN 33* 42* 44* 35* 30* 31*  CREATININE 1.07 1.01 1.19 1.02 1.03 1.06  CALCIUM 7.7* 7.8* 8.3* 7.9* 7.7* 7.9*  MG 2.3  --  2.3  --  2.1  --   PHOS 3.3  --  4.5  --  3.4  --    GFR: Estimated Creatinine Clearance: 79 mL/min (by C-G formula based on SCr of 1.06 mg/dL). Liver Function Tests: Recent Labs  Lab 03/21/21 0406 03/24/21 0439  AST 27 33  ALT 31 47*  ALKPHOS 75 88  BILITOT 0.5 0.6  PROT 5.6* 6.1*  ALBUMIN <1.5* 1.7*   No results for input(s): LIPASE, AMYLASE in the last 168 hours. No results for input(s): AMMONIA  in the last 168 hours. Coagulation Profile: No results for input(s): INR, PROTIME in the last 168 hours. Cardiac Enzymes: No results for input(s): CKTOTAL, CKMB, CKMBINDEX, TROPONINI in the last 168 hours. BNP (last 3 results) No results for input(s): PROBNP in the last 8760 hours. HbA1C: No results for input(s): HGBA1C in the last 72 hours. CBG: Recent Labs  Lab 03/26/21 1624 03/26/21 1959 03/26/21 2321 03/27/21 0414 03/27/21 0742  GLUCAP 136* 153* 129* 155* 144*   Lipid Profile: No results for input(s): CHOL, HDL, LDLCALC, TRIG, CHOLHDL, LDLDIRECT in the last 72 hours.  Thyroid Function Tests: No results for input(s): TSH, T4TOTAL, FREET4, T3FREE, THYROIDAB in the last 72 hours. Anemia Panel: No results for input(s): VITAMINB12, FOLATE, FERRITIN, TIBC, IRON, RETICCTPCT in the last 72 hours.   Radiology Studies: I have reviewed all of the imaging during this hospital visit personally  Scheduled Meds:  (feeding supplement) PROSource Plus  30 mL Oral BID BM   acetaminophen  650 mg Oral Q6H   amiodarone  200 mg Oral Daily   Chlorhexidine Gluconate Cloth  6 each Topical Daily   diltiazem  30 mg Oral Q6H   docusate sodium  100 mg Oral BID   enoxaparin (LOVENOX) injection  100 mg Subcutaneous Q12H   feeding supplement  237 mL Oral TID BM   furosemide  40 mg Intravenous BID   insulin aspart  0-15 Units Subcutaneous Q4H   lidocaine  1 patch Transdermal Q24H   lidocaine  1 patch Transdermal Q24H   methocarbamol  1,000 mg Oral QID   metoCLOPramide (REGLAN) injection  10 mg Intravenous Q6H   nicotine  21 mg Transdermal Daily   pantoprazole (PROTONIX) IV  40 mg Intravenous Q12H   sodium chloride flush  10 mL Intracatheter Q12H   Continuous Infusions:  sodium chloride Stopped (03/24/21 2259)   TPN ADULT (ION) 70 mL/hr at 03/26/21 1735   TPN ADULT (ION)       LOS: 35 days   Phillips Climes, MD [facility] Hospitalist

## 2021-03-27 NOTE — Progress Notes (Signed)
PHARMACY - TOTAL PARENTERAL NUTRITION CONSULT NOTE  Indication: Prolonged ileus  Patient Measurements: Height: 6' (182.9 cm) Weight: 98.9 kg (218 lb 0.6 oz) IBW/kg (Calculated) : 77.6 TPN AdjBW (KG): 82.6 Body mass index is 29.57 kg/m.  Assessment:  89 YOM with lower abdominal pain/N/V found to have perforated diverticulitis. CT abd on 9/3 found to have active ileus/pSBO from diverticulitis/bowel inflammation. Pharmacy consulted to manage TPN for prolonged ileus. Of note patient has a history pf alcohol dependency and is at high risk for refeeding.   Glucose / Insulin: no hx DM, A1c 5.9% - CBGs < 180.  Off steroid on 10/5. Used 15 units SSI in past 24 hrs, 30 units insulin in TPN.  Electrolytes: Na 135 (max in TPN), K 4.3 (Lasix increased to 40mg  IV BID on 10/7, KCL 49mEq PO on 10/8), others WNL Renal: SCr 1.06, BUN 31 Hepatic: LFTs / tbili  WNL, TG 68 (9/26) >> 198 (ILE 20% of kCal), albumin 1.7 Intake / Output; MIVF: UOP 2 ml/kg/hr with Lasix, net -18L per charting, LBM 10/6 GI Imaging:  - 9/3 CT abd: active ileus/pSBO - 9/16 CT abd: L-iliopsoas fluid collection (abscess or hematoma), resolution of prior diverticular abscess, still w/ evidence of ileus - 9/27 CT abd: continued retroperitoneal complex fluid collection, SBO - 10/4 CT: Left iliopsoas enlargement heterogeneity persists > may represent intramuscular hematoma, infection or mass.  Ieus, degree of small-bowel distention has decreased, pSBO cannot be excluded. GI Surgeries / Procedures:  - 9/10 ex-lap, sigmoid colectomy w/ end colostomy creation, enterorrhaphy, drainage of intra-abd abscess, wound vac placement  Central access: PICC line 02/24/21 TPN start date: 02/25/21  Nutritional Goals, RD Estimated Needs Total Energy Estimated Needs: 2000-2200 Total Protein Estimated Needs: 100-110 grams Total Fluid Estimated Needs: >2L  Current Nutrition:  TPN CLD started 10/6 - minimal intake Prosource BID - 2 charted given on  10/8 Ensure Surgery BID - 2 charted given on 10/8  Plan:  Continue concentrated TPN at 70 ml/hr to provide 105g AA, 353g CHO and 41g ILE for a total of 2032 kCal, meeting 100% of patient's needs Electrolytes in TPN: Na 144mEq/L, K 21mEq/L, Ca 9mEq/L, Mg 68mEq/L, Phos 61mmol/L, Cl:Ac 1:2 for now Add standard MVI, trace elements, folate and thiamine to TPN Continue moderate SSI Q4H and 30 units regular insulin in TPN Standard TPN labs on Mon and Thurs  F/U PO intake/diet advancement >> may advance to FLD, Lasix/lytes   Ziad Maye D. Mina Marble, PharmD, BCPS, [place] 03/27/2021, 8:46 AM

## 2021-03-27 NOTE — Progress Notes (Signed)
Physical Therapy Treatment Patient Details Name: Travis Conrad MRN: [id number] DOB: May 03, 1951 Today's Date: 03/27/2021   History of Present Illness 70 yo admitted 9/3 with abdominal pain, vomiting and ileus. 9/4 Afib with RVR. 9/8 intrabdominal abscess s/p IR drain placed. 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 Pt found to have lt iliopsoas hematoma on CT 9/16.  9/20 left knee aspiration. 9/26 NG tube removed. 9/27 NGT replaced due to vomiting. 10/1 atelectasis. PMhx: HTN, HLD, Afib, ETOH use    PT Comments    Pt needed encouragement today.  A little anxious and "testy", but participated.  Emphasis on gait as HR in afib starting on an upward trajectory.    Recommendations for follow up therapy are one component of a multi-disciplinary discharge planning process, led by the attending physician.  Recommendations may be updated based on patient status, additional functional criteria and insurance authorization.  Follow Up Recommendations  Supervision/Assistance - 24 hour;Home health PT     Equipment Recommendations  3in1 (PT)    Recommendations for Other Services       Precautions / Restrictions Precautions Precautions: Fall Precaution Comments: wound VAC     Mobility  Bed Mobility Overal bed mobility: Needs Assistance Bed Mobility: Supine to Sit;Sit to Supine     Supine to sit: Min assist Sit to supine: Mod assist   General bed mobility comments: cues for sequencing,  min assist needed to hooklying, but somewhat a struggle with L LE. min truncal assist up onto L elbow.  pt scooted to EOB without assist    Transfers Overall transfer level: Needs assistance   Transfers: Sit to/from Stand Sit to Stand: Min assist         General transfer comment: cues for hand placement, elevated bed.  Ambulation/Gait Ambulation/Gait assistance: Min assist;Min guard Gait Distance (Feet): 155 Feet Assistive device: Rolling  walker (2 wheeled) Gait Pattern/deviations: Step-through pattern   Gait velocity interpretation: 1.31 - 2.62 ft/sec, indicative of limited community ambulator General Gait Details: generally steady with moderate use of the RW.  cues to stay under control as he increased speed to rush back, turn and sit with less safety in mind.   HR in afib and variable from low 100's to mid 120's.  SpO2 mid to upper 90's   Stairs             Wheelchair Mobility    Modified Rankin (Stroke Patients Only)       Balance Overall balance assessment: Needs assistance Sitting-balance support: No upper extremity supported Sitting balance-Leahy Scale: Good       Standing balance-Leahy Scale: Poor Standing balance comment: reliant on RW in standing                            Cognition Arousal/Alertness: Awake/alert Behavior During Therapy: Anxious;WFL for tasks assessed/performed Overall Cognitive Status: Within Functional Limits for tasks assessed                                        Exercises      General Comments        Pertinent Vitals/Pain Pain Assessment: Faces Faces Pain Scale: Hurts a little bit Pain Location: abdomen Pain Descriptors / Indicators: Discomfort;Grimacing Pain Intervention(s): Monitored during session    Home Living  Prior Function            PT Goals (current goals can now be found in the care plan section) Acute Rehab PT Goals Patient Stated Goal: to get back to baseline PT Goal Formulation: With patient/family Time For Goal Achievement: 03/28/21 Potential to Achieve Goals: Good Progress towards PT goals: Progressing toward goals    Frequency    Min 3X/week      PT Plan Current plan remains appropriate    Co-evaluation              AM-PAC PT "6 Clicks" Mobility   Outcome Measure  Help needed turning from your back to your side while in a flat bed without using bedrails?: A  Little Help needed moving from lying on your back to sitting on the side of a flat bed without using bedrails?: A Little Help needed moving to and from a bed to a chair (including a wheelchair)?: A Little Help needed standing up from a chair using your arms (e.g., wheelchair or bedside chair)?: A Little Help needed to walk in hospital room?: A Little Help needed climbing 3-5 steps with a railing? : A Lot 6 Click Score: 17    End of Session   Activity Tolerance: Patient tolerated treatment well;Patient limited by fatigue Patient left: in bed;with call bell/phone within reach;with bed alarm set Nurse Communication: Mobility status PT Visit Diagnosis: Other abnormalities of gait and mobility (R26.89);Difficulty in walking, not elsewhere classified (R26.2)     Time: 6203-5597 PT Time Calculation (min) (ACUTE ONLY): 22 min  Charges:  $Gait Training: 8-22 mins                     03/27/2021  Ginger Carne., PT Acute Rehabilitation Services [phone]  (pager) [phone]  (office)   Tessie Fass Juanetta Negash 03/27/2021, 12:47 PM

## 2021-03-28 ENCOUNTER — Inpatient Hospital Stay (HOSPITAL_COMMUNITY): Payer: Medicare Other

## 2021-03-28 DIAGNOSIS — K567 Ileus, unspecified: Secondary | ICD-10-CM | POA: Diagnosis not present

## 2021-03-28 DIAGNOSIS — I4891 Unspecified atrial fibrillation: Secondary | ICD-10-CM

## 2021-03-28 DIAGNOSIS — K5720 Diverticulitis of large intestine with perforation and abscess without bleeding: Secondary | ICD-10-CM | POA: Diagnosis not present

## 2021-03-28 LAB — PHOSPHORUS: Phosphorus: 3.5 mg/dL (ref 2.5–4.6)

## 2021-03-28 LAB — COMPREHENSIVE METABOLIC PANEL
ALT: 39 U/L (ref 0–44)
AST: 24 U/L (ref 15–41)
Albumin: 1.7 g/dL — ABNORMAL LOW (ref 3.5–5.0)
Alkaline Phosphatase: 77 U/L (ref 38–126)
Anion gap: 6 (ref 5–15)
BUN: 29 mg/dL — ABNORMAL HIGH (ref 8–23)
CO2: 25 mmol/L (ref 22–32)
Calcium: 8.1 mg/dL — ABNORMAL LOW (ref 8.9–10.3)
Chloride: 102 mmol/L (ref 98–111)
Creatinine, Ser: 1.07 mg/dL (ref 0.61–1.24)
GFR, Estimated: >60 mL/min (ref >60)
Glucose, Bld: 157 mg/dL — ABNORMAL HIGH (ref 70–99)
Potassium: 4.5 mmol/L (ref 3.5–5.1)
Sodium: 133 mmol/L — ABNORMAL LOW (ref 135–145)
Total Bilirubin: 0.6 mg/dL (ref 0.3–1.2)
Total Protein: 5.7 g/dL — ABNORMAL LOW (ref 6.5–8.1)

## 2021-03-28 LAB — URINALYSIS, ROUTINE W REFLEX MICROSCOPIC
Bacteria, UA: NONE SEEN
Bilirubin Urine: NEGATIVE
Glucose, UA: NEGATIVE mg/dL
Ketones, ur: NEGATIVE mg/dL
Leukocytes,Ua: NEGATIVE
Nitrite: NEGATIVE
Protein, ur: NEGATIVE mg/dL
Specific Gravity, Urine: 1.01 (ref 1.005–1.030)
pH: 9 — ABNORMAL HIGH (ref 5.0–8.0)

## 2021-03-28 LAB — CBC
HCT: 25.2 % — ABNORMAL LOW (ref 39.0–52.0)
Hemoglobin: 7.9 g/dL — ABNORMAL LOW (ref 13.0–17.0)
MCH: 30.5 pg (ref 26.0–34.0)
MCHC: 31.3 g/dL (ref 30.0–36.0)
MCV: 97.3 fL (ref 80.0–100.0)
Platelets: 382 10*3/uL (ref 150–400)
RBC: 2.59 MIL/uL — ABNORMAL LOW (ref 4.22–5.81)
RDW: 15.2 % (ref 11.5–15.5)
WBC: 7.1 10*3/uL (ref 4.0–10.5)
nRBC: 0 % (ref 0.0–0.2)

## 2021-03-28 LAB — MAGNESIUM: Magnesium: 2.1 mg/dL (ref 1.7–2.4)

## 2021-03-28 LAB — GLUCOSE, CAPILLARY
Glucose-Capillary: 147 mg/dL — ABNORMAL HIGH (ref 70–99)
Glucose-Capillary: 116 mg/dL — ABNORMAL HIGH (ref 70–99)
Glucose-Capillary: 158 mg/dL — ABNORMAL HIGH (ref 70–99)
Glucose-Capillary: 143 mg/dL — ABNORMAL HIGH (ref 70–99)
Glucose-Capillary: 121 mg/dL — ABNORMAL HIGH (ref 70–99)

## 2021-03-28 LAB — TRIGLYCERIDES: Triglycerides: 151 mg/dL — ABNORMAL HIGH (ref <150)

## 2021-03-28 MED ORDER — ENSURE ENLIVE PO LIQD
237.0000 mL | Freq: Three times a day (TID) | ORAL | Status: DC
  Administered 2021-03-28 – 2021-04-06 (×20): 237 mL via ORAL
  Filled 2021-03-28: qty 237

## 2021-03-28 MED ORDER — DILTIAZEM HCL 25 MG/5ML IV SOLN
10.0000 mg | Freq: Once | INTRAVENOUS | Status: AC
  Administered 2021-03-28: 10 mg via INTRAVENOUS
  Filled 2021-03-28: qty 5

## 2021-03-28 MED ORDER — TRACE MINERALS CU-MN-SE-ZN 300-55-60-3000 MCG/ML IV SOLN
INTRAVENOUS | Status: AC
  Filled 2021-03-28: qty 350

## 2021-03-28 MED ORDER — DILTIAZEM 12 MG/ML ORAL SUSPENSION
60.0000 mg | Freq: Four times a day (QID) | ORAL | Status: DC
  Administered 2021-03-28 – 2021-04-01 (×15): 60 mg via ORAL
  Filled 2021-03-28 (×22): qty 6

## 2021-03-28 MED ORDER — PROSOURCE PLUS PO LIQD
30.0000 mL | Freq: Three times a day (TID) | ORAL | Status: DC
  Administered 2021-03-28 – 2021-04-06 (×22): 30 mL via ORAL
  Filled 2021-03-28 (×20): qty 30

## 2021-03-28 MED ORDER — FUROSEMIDE 10 MG/ML IJ SOLN
40.0000 mg | Freq: Every day | INTRAMUSCULAR | Status: DC
  Administered 2021-03-28 – 2021-03-29 (×2): 40 mg via INTRAVENOUS
  Filled 2021-03-28 (×2): qty 4

## 2021-03-28 MED ORDER — INSULIN ASPART 100 UNIT/ML IJ SOLN
0.0000 [IU] | Freq: Four times a day (QID) | INTRAMUSCULAR | Status: DC
  Administered 2021-03-28 – 2021-03-29 (×4): 2 [IU] via SUBCUTANEOUS

## 2021-03-28 MED ORDER — OXYCODONE HCL 5 MG PO TABS
5.0000 mg | ORAL_TABLET | ORAL | Status: DC | PRN
Start: 2021-03-28 — End: 2021-03-31
  Administered 2021-03-28 – 2021-03-30 (×4): 10 mg via ORAL
  Filled 2021-03-28 (×4): qty 2

## 2021-03-28 MED ORDER — PANTOPRAZOLE 2 MG/ML SUSPENSION
40.0000 mg | Freq: Two times a day (BID) | ORAL | Status: DC
  Administered 2021-03-28 – 2021-03-31 (×7): 40 mg via ORAL
  Filled 2021-03-28 (×8): qty 20

## 2021-03-28 MED ORDER — MORPHINE SULFATE (PF) 2 MG/ML IV SOLN
2.0000 mg | INTRAVENOUS | Status: DC | PRN
  Administered 2021-03-28: 4 mg via INTRAVENOUS
  Administered 2021-03-28: 2 mg via INTRAVENOUS
  Filled 2021-03-28: qty 1
  Filled 2021-03-28: qty 2

## 2021-03-28 NOTE — Consult Note (Addendum)
[place] Nurse wound follow up Patient receiving care in [facility] 2W01 Barkley Boards, PA-C present to evaluate wound. NPWT discontinued today.  Wound type: Surgical midline incision Wound bed: Clean, pink granulation tissue Measurements: 10.2 cm x 3.5 cm x 0.8 cm Drainage (amount, consistency, odor) Sanguinous in canister Periwound: intact Dressing procedure/placement/frequency: One piece of black foam dressing removed.  Placed a saline moistened 4 x 4 into the wound and covered with dry 4 x 4 and ABD pad. Change daily   WOC Nurse ostomy follow up Stoma type/location: LUQ colostomy Stomal assessment/size: 1 1/4" oval red budded just above the level of the skin. Sutures in place.  Peristomal assessment: intact Treatment options for stomal/peristomal skin: Barrier ring Output: emptied 125 cc of dark green/black stool, not as sticky as previously. Ostomy pouching: 1pc. convex Education provided: None today. Pouch will be due for change on Thurs or Friday of this week. Allow spouse to empty and change pouch.  Enrolled patient in [place] Start Discharge program: Yes (previously) WOC is signing off but will be available to this patient if needed. Please re-consult.    Cathlean Marseilles Tamala Julian, MSN, RN, [place], [name], [facility] Wound Treatment Associate Pager [phone]

## 2021-03-28 NOTE — Progress Notes (Signed)
Nutrition Follow-up  DOCUMENTATION CODES:  Not applicable  INTERVENTION:  -d/c Boost Breeze and/or Ensure Surgery -Ensure Enlive po TID, each supplement provides 350 kcal and 20 grams of protein -Magic cup TID with meals, each supplement provides 290 kcal and 9 grams of protein -PROSource PLUS PO 25mls TID, each supplement provides 100 kcals and 15 grams of protein -Transition to not appropriate for room service -Continue TPN management per Pharmacy  NUTRITION DIAGNOSIS:  Inadequate oral intake related to acute illness (diverticulitis) as evidenced by per patient/family report, NPO status. -- ongoing  GOAL:  Patient will meet greater than or equal to 90% of their needs -- addressing with TPN and supplements  MONITOR:  Supplement acceptance, PO intake, Diet advancement, Weight trends, Labs, I & O's  REASON FOR ASSESSMENT:  Consult New TPN/TNA  ASSESSMENT:  Pt with PMH significant for HTN, Afib, h/o diverticulitis, heavy EtOH use, and high cholesterol admitted with severe sepsis due to perforated diverticulitis/intra-abdominal abscess. Pt now s/p laparotomy w/ colectomy complicated by postoperative ileus.  9/04 - developed Afib w/ rapid ventricular response, required IV amiodarone infusion 9/07 - f/u CT w/ increased size of pelvic abscess 9/08 - s/p drainage catheter placement of pelvic abscess 9/09 - TPN initiated 9/10 - s/p ex lap, sigmoid colectomy, end colostomy creation (Hartmann's procedure) w/ enterorrhaphy and drainage of intra-abdominal abscess and woundVAC placement of 50cm area of abdomen 9/16 - CT revealed heterogenous left iliopsoas collection which may represent abscess or hematoma, stranding in the retroperitoneum and subcu tissue; per IR do no recommend aspiration or drainage at this time  9/19 - post-operative anemia requiring 1 unit PRBC   9/20 - s/p lt knee aspiration and injection due to lt knee effusions 9/23 - diet advanced to clear liquids 9/26 - NGT  removed; TPN rate reduced to half 9/27 - NGT replaced and connected to LIWS 2/2 vomiting and abdominal distention, abd xray showing recurrent ileus; pt made NPO 9/29 - surgical drain removed  10/04 - CT w/ ileus although improved distention of small bowel, stable posas collection 10/05 - NGT fell out per MD note 10/06 - pt initiated on clear liquid diet 10/07 - KUB showed dilated loops of small bowel are slightly more prominent than on the prior study, gas noted in the colon 10/09 - diet advanced to full liquids  Repeat KUB findings from this morning read as follows: "The bowel gas pattern is normal. No radio-opaque calculi or other significant radiographic abnormality are seen."  Pt reports appetite has slightly improved and has tolerated clear liquid diet. minimal meal documentation since last RD assessment. Diet advanced to full liquids today. Per RN, pt was given Ensure and tolerated it well. RN notes pt also tolerating Prosource well. RD will adjust supplement regimen to provide more kcals/protein. Per Surgery, TPN to be decreased to half rate tonight. Per Pharmacy, TPN will run at 51ml/hr to provide 52 grams protein and 1015 kcals to meet ~ 50% nutritional needs.   Pt's anasarca improving w/ concentrated TPN and IV Lasix per MD; pt has diuresed ~10L over last 48 hours. Lasix frequency being decreased to once/day as BP started to become low w/ diuresis.   Pt with deep pitting edema to BLE per RN edema assessment  UOP: 5,567mL x24 hours Colostomy output: 57ml x24 hours I/O: -10,126mL since admit  Admit weight: 79.4 kg  Current weight: 93.6 kg   Medications: colace, IV lasix, SSI Q6H, reglan, protonix Labs: Na 133 (L) CBGs 116-171 x 24 hours  Diet Order:   Diet Order             Diet full liquid Room service appropriate? Yes; Fluid consistency: Thin  Diet effective now                  EDUCATION NEEDS:  No education needs have been identified at this time  Skin:  Skin  Assessment: Skin Integrity Issues: Skin Integrity Issues:: Wound VAC Wound Vac: abdomen Incisions: closed incision to abdomen  Last BM:  10/09 578ml via colostomy  Height:  Ht Readings from Last 1 Encounters:  02/26/21 6' (1.829 m)   Weight:  Wt Readings from Last 10 Encounters:  03/28/21 93.6 kg  06/28/20 79.4 kg  04/06/20 78.7 kg  01/05/20 78 kg  11/03/19 78.7 kg  10/03/19 79.4 kg  08/20/19 78.9 kg  07/22/19 79.1 kg  05/02/19 79.4 kg  04/02/18 80.3 kg   BMI:  Body mass index is 27.99 kg/m.  Estimated Nutritional Needs:  Kcal:  2000-2200 Protein:  100-110 grams Fluid:  >2L    Larkin Ina, MS, RD, LDN (she/her/hers) RD pager number and weekend/on-call pager number located in [place].

## 2021-03-28 NOTE — Progress Notes (Addendum)
PHARMACY - TOTAL PARENTERAL NUTRITION CONSULT NOTE  Indication: Prolonged ileus  Patient Measurements: Height: 6' (182.9 cm) Weight: 93.6 kg (206 lb 5.6 oz) IBW/kg (Calculated) : 77.6 TPN AdjBW (KG): 82.6 Body mass index is 27.99 kg/m.  Assessment:  27 YOM with lower abdominal pain/N/V found to have perforated diverticulitis. CT abd on 9/3 found to have active ileus/pSBO from diverticulitis/bowel inflammation. Pharmacy consulted to manage TPN for prolonged ileus. Of note patient has a history pf alcohol dependency and is at high risk for refeeding.   Glucose / Insulin: no hx DM, A1c 5.9% - CBGs < 180.  Off steroid on 10/5. Used 13 units SSI in past 24 hrs, 30 units insulin in TPN.  Electrolytes: Na 133 (max in TPN), K 4.5 (Lasix increased to 40mg  IV BID on 10/7, to daily on 10/10), CoCa 9.9, others WNL Renal: SCr 1.07, BUN 29 Hepatic: LFTs / tbili  WNL, TG 151, albumin 1.7;  Intake / Output; MIVF: UOP 2.5 ml/kg/hr with Lasix, net -25L per charting, colostomy 580 out on metoclopramide 10 IV Q 6 hr GI Imaging:  - 9/3 CT abd: active ileus/pSBO - 9/16 CT abd: L-iliopsoas fluid collection (abscess or hematoma), resolution of prior diverticular abscess, still w/ evidence of ileus - 9/27 CT abd: continued retroperitoneal complex fluid collection, SBO - 10/4 CT: Left iliopsoas enlargement heterogeneity persists > may represent intramuscular hematoma, infection or mass.  Ieus, degree of small-bowel distention has decreased, pSBO cannot be excluded. - 10/7 KUB: Dilated loops of small bowel are slightly more prominent than on the prior study. Gas is noted within the colon. - 10/10 KUB read pending  GI Surgeries / Procedures:  - 9/10 ex-lap, sigmoid colectomy w/ end colostomy creation, enterorrhaphy, drainage of intra-abd abscess, wound vac placement  Central access: PICC line 02/24/21 TPN start date: 02/25/21  Nutritional Goals, RD Estimated Needs Total Energy Estimated Needs: 2000-2200 Total  Protein Estimated Needs: 100-110 grams Total Fluid Estimated Needs: >2L  Current Nutrition:  TPN 10/6 CLD started, minimal intake 10/8 Drank 1.5 Ensure  10/9 FLD- ate 2 Ensure, 3 spoonfuls of pudding and sorbet. Wife to bring in liquid foods patient might like better   Plan:  Halve concentrated TPN per surgery to 35 ml/hr to provide 52 g AA, 176 g CHO and 20 g ILE for a total of 1015 kCal, meeting ~50% of patient's needs Electrolytes in TPN adjusted to prevent rapid drop: Na 189mEq/L, K 90 mEq/L, Ca 35mEq/L, Mg 15 mEq/L, Phos 30 mmol/L, Cl:Ac 1:2 Add standard MVI, trace elements, folate and thiamine to TPN Decrease to moderate SSI Q6 hr and 20 units regular insulin in TPN Standard TPN labs 10/11 and on Mon and Thurs  Watch diuresis and K  F/U PO intake/diet advancement    Benetta Spar, PharmD, BCPS, [facility] Clinical Pharmacist  Please check AMION for all [place] phone numbers After 10:00 PM, call [place]

## 2021-03-28 NOTE — Progress Notes (Signed)
Progress Note  30 Days Post-Op  Subjective: Patient tolerating FLD, did not like the grits yesterday. He denies abdominal pain presently but still has some intermittent pain. Seen with Tyhee RN this AM.   Objective: Vital signs in last 24 hours: Temp:  [97.1 F (36.2 C)-98.5 F (36.9 C)] 98 F (36.7 C) (10/10 0800) Pulse Rate:  [87-129] 87 (10/10 0800) Resp:  [18-28] 19 (10/10 0800) BP: (104-144)/(54-100) 116/69 (10/10 0800) SpO2:  [92 %-99 %] 97 % (10/10 0800) Weight:  [93.6 kg] 93.6 kg (10/10 0500) Last BM Date: 03/27/21  Intake/Output from previous day: 10/09 0701 - 10/10 0700 In: 1483.4 [P.O.:840; I.V.:643.4] Out: 6105 [Urine:5525; Stool:580] Intake/Output this shift: No intake/output data recorded.  PE: Gen:  Alert, NAD, pleasant Pulm:  Normal rate and effort.  Abd: Soft, ND, NT, +BS. Stoma appears viable with stool present in ostomy appliance. Midline wound clean with granulation tissue present    Lab Results:  Recent Labs    03/27/21 0148 03/28/21 0504  WBC 7.5 7.1  HGB 7.8* 7.9*  HCT 24.3* 25.2*  PLT 360 382   BMET Recent Labs    03/27/21 0148 03/28/21 0504  NA 135 133*  K 4.3 4.5  CL 101 102  CO2 26 25  GLUCOSE 141* 157*  BUN 31* 29*  CREATININE 1.06 1.07  CALCIUM 7.9* 8.1*   PT/INR No results for input(s): LABPROT, INR in the last 72 hours. CMP     Component Value Date/Time   NA 133 (L) 03/28/2021 0504   NA 134 09/30/2019 0959   NA 142 03/03/2016 0921   K 4.5 03/28/2021 0504   K 4.3 03/03/2016 0921   CL 102 03/28/2021 0504   CO2 25 03/28/2021 0504   CO2 25 03/03/2016 0921   GLUCOSE 157 (H) 03/28/2021 0504   GLUCOSE 89 03/03/2016 0921   BUN 29 (H) 03/28/2021 0504   BUN 11 09/30/2019 0959   BUN 13.6 03/03/2016 0921   CREATININE 1.07 03/28/2021 0504   CREATININE 1.2 03/03/2016 0921   CALCIUM 8.1 (L) 03/28/2021 0504   CALCIUM 9.6 03/03/2016 0921   PROT 5.7 (L) 03/28/2021 0504   PROT 7.3 03/03/2016 0921   ALBUMIN 1.7 (L)  03/28/2021 0504   ALBUMIN 3.8 03/03/2016 0921   AST 24 03/28/2021 0504   AST 36 (H) 03/03/2016 0921   ALT 39 03/28/2021 0504   ALT 31 03/03/2016 0921   ALKPHOS 77 03/28/2021 0504   ALKPHOS 116 03/03/2016 0921   BILITOT 0.6 03/28/2021 0504   BILITOT 0.71 03/03/2016 0921   GFRNONAA >60 03/28/2021 0504   GFRNONAA 67 07/25/2013 1712   GFRAA >60 10/03/2019 0930   GFRAA 77 07/25/2013 1712   Lipase     Component Value Date/Time   LIPASE 22 02/19/2021 1653       Studies/Results: No results found.  Anti-infectives: Anti-infectives (From admission, onward)    Start     Dose/Rate Route Frequency Ordered Stop   03/09/21 0800  piperacillin-tazobactam (ZOSYN) IVPB 3.375 g        3.375 g 12.5 mL/hr over 240 Minutes Intravenous Every 8 hours 03/09/21 0706 03/25/21 2034   02/26/21 0100  doxycycline (VIBRAMYCIN) 100 mg in sodium chloride 0.9 % 250 mL IVPB        100 mg 125 mL/hr over 120 Minutes Intravenous 2 times daily 02/26/21 0005 03/08/21 2359   02/20/21 0300  piperacillin-tazobactam (ZOSYN) IVPB 3.375 g        3.375 g 12.5 mL/hr over 240  Minutes Intravenous Every 8 hours 02/19/21 1907 03/08/21 2359   02/19/21 1900  ceFEPIme (MAXIPIME) 2 g in sodium chloride 0.9 % 100 mL IVPB  Status:  Discontinued        2 g 200 mL/hr over 30 Minutes Intravenous  Once 02/19/21 1857 02/19/21 1906   02/19/21 1900  metroNIDAZOLE (FLAGYL) IVPB 500 mg  Status:  Discontinued        500 mg 100 mL/hr over 60 Minutes Intravenous  Once 02/19/21 1857 02/19/21 1906   02/19/21 1815  piperacillin-tazobactam (ZOSYN) IVPB 3.375 g        3.375 g 100 mL/hr over 30 Minutes Intravenous  Once 02/19/21 1809 02/19/21 1944        Assessment/Plan POD30 s/p ex lap, sigmoid colectomy, end colostomy with enterorrhaphy and drainage of intraabdominal abscess - Dr. Kieth Brightly and Dr. Radene Knee 9/10 for Acute sigmoid diverticulitis with contained perforation and abscess - CT 9/16 demonstrates heterogenous left iliopsoas  collection likely hematoma, stranding in the retroperitoneum and subcu tissue.  Distal colon appears decompressed and very small caliber but there is contrast entering this area; there is additionally a similarly small-caliber segment of colon in the proximal transverse and the ascending colon is not dilated- suspect over all this represents small bowel/gastric ileus.  IR does not recommend draining the hematoma at this point. ID following and recommending Zosyn through 10/17  - NGT replaced 9/27  - CT 9/27 with continued complex fluid collection along left psoas, SBO with transition in mid jejunum, post-surgical changes of colon resection with colostomy - Was started on reglan last weekend. Discussed with him the risk of tardive dyskinesia/EPS on 9/20. Patient stated understanding and is okay with continuing Reglan.  - Gastrograffin study 9/28 showed Stable gaseous distention of the small bowel, oral contrast progressed into the colon - CT 10/4 with ileus although improved distention of small bowel, stable psoas collection - tolerating FLD and ensure - 1/2 TPN and continue FLD today  - start transitioning to PO pain meds - WOCN following for new ostomy teaching and vac changes - DC VAC today, daily wet to dry dressing to midline  - Cont TPN - Encouraged ambulation (working with therapies), use IS, multimodal pain control  - Cont abx per ID recs   FEN - FLD, ensure, 1/2 TPN VTE - SCDs, therapeutic lovenox  ID - Zosyn 9/4 - 10/17 (per ID) Foley - out, voiding. complained of some dysuria today - check UA and Cx  LOS: 36 days    Norm Parcel, [facility] Surgery 03/28/2021, 8:15 AM Please see Amion for pager number during day hours 7:00am-4:30pm

## 2021-03-28 NOTE — Progress Notes (Signed)
Physical Therapy Treatment Patient Details Name: Travis Conrad MRN: [id number] DOB: 05/25/1951 Today's Date: 03/28/2021   History of Present Illness 70 yo admitted 9/3 with abdominal pain, vomiting and ileus. 9/4 Afib with RVR. 9/8 intrabdominal abscess s/p IR drain placed. 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 Pt found to have lt iliopsoas hematoma on CT 9/16.  9/20 left knee aspiration. 9/26 NG tube removed. 9/27 NGT replaced due to vomiting. 10/1 atelectasis. PMhx: HTN, HLD, Afib, ETOH use    PT Comments    Patient received in bed, very pleasant and motivated to work with therapy; reports he is a bit more short of breath than typical. Able to mobilize with Min guard to [place], however unable to progress gait out of his room due to Afib pattern as high as 134BPM and associated SOB/fatigue. Left in bed in chair position with all needs met, alarm active and spouse present. Seems to have had a complicated medical course of care, would benefit from intensive therapies in CIR setting prior to return home.     Recommendations for follow up therapy are one component of a multi-disciplinary discharge planning process, led by the attending physician.  Recommendations may be updated based on patient status, additional functional criteria and insurance authorization.  Follow Up Recommendations  CIR;Supervision/Assistance - 24 hour     Equipment Recommendations  3in1 (PT)    Recommendations for Other Services       Precautions / Restrictions Precautions Precautions: Fall Precaution Comments: abdominal incision, wound VAC out 10/10, watch HR Restrictions Weight Bearing Restrictions: No     Mobility  Bed Mobility Overal bed mobility: Needs Assistance Bed Mobility: Rolling;Sidelying to Sit;Sit to Sidelying Rolling: Min guard Sidelying to sit: Min assist     Sit to sidelying: Mod assist General bed mobility comments: able to roll  and push up to midline sitting well to get to EOB, however did need as much as ModA for BLE management for return to supine. Able to scoot to EOB well    Transfers Overall transfer level: Needs assistance Equipment used: Rolling walker (2 wheeled) Transfers: Sit to/from Stand Sit to Stand: Min assist;From elevated surface         General transfer comment: cues for hand placement, elevated bed.  Ambulation/Gait Ambulation/Gait assistance: Min guard Gait Distance (Feet): 25 Feet Assistive device: Rolling walker (2 wheeled) Gait Pattern/deviations: Step-through pattern;Trunk flexed Gait velocity: Decreased   General Gait Details: slow and steady with RW but gait distance limited by HR and SOB today. Did need occasional MinA due to mild LOB when trying to turn/as fatigue increased   Stairs             Wheelchair Mobility    Modified Rankin (Stroke Patients Only)       Balance Overall balance assessment: Needs assistance Sitting-balance support: No upper extremity supported Sitting balance-Leahy Scale: Good Sitting balance - Comments: EOB without UE support   Standing balance support: Bilateral upper extremity supported;During functional activity Standing balance-Leahy Scale: Poor Standing balance comment: reliant on RW in standing                            Cognition Arousal/Alertness: Awake/alert Behavior During Therapy: WFL for tasks assessed/performed Overall Cognitive Status: Within Functional Limits for tasks assessed  Problem Solving: Slow processing General Comments: slight decreased STM and delayed processing time but had good carryover of sequencing from prior PT sessions; HOH but appropriate when able to follow what PT was asking of him      Exercises      General Comments General comments (skin integrity, edema, etc.): SpO2 in 90s on RA, but HR in afib  to max of 134BPM and with waves of SOB  that corresponded to A-fib peaks      Pertinent Vitals/Pain Pain Assessment: Faces Faces Pain Scale: Hurts little more Pain Location: abdomen Pain Descriptors / Indicators: Discomfort;Grimacing Pain Intervention(s): Limited activity within patient's tolerance;Monitored during session;Repositioned    Home Living                      Prior Function            PT Goals (current goals can now be found in the care plan section) Acute Rehab PT Goals Patient Stated Goal: to get back to baseline PT Goal Formulation: With patient/family Time For Goal Achievement: 04/11/21 Potential to Achieve Goals: Good Progress towards PT goals: Progressing toward goals    Frequency    Min 3X/week      PT Plan Discharge plan needs to be updated    Co-evaluation              AM-PAC PT "6 Clicks" Mobility   Outcome Measure  Help needed turning from your back to your side while in a flat bed without using bedrails?: A Little Help needed moving from lying on your back to sitting on the side of a flat bed without using bedrails?: A Little Help needed moving to and from a bed to a chair (including a wheelchair)?: A Little Help needed standing up from a chair using your arms (e.g., wheelchair or bedside chair)?: A Little Help needed to walk in hospital room?: A Little Help needed climbing 3-5 steps with a railing? : A Lot 6 Click Score: 17    End of Session Equipment Utilized During Treatment: Gait belt Activity Tolerance: Patient limited by fatigue;Patient tolerated treatment well Patient left: in bed;with call bell/phone within reach;with bed alarm set (bed in chair position) Nurse Communication: Mobility status PT Visit Diagnosis: Other abnormalities of gait and mobility (R26.89);Difficulty in walking, not elsewhere classified (R26.2)     Time: 8675-4492 PT Time Calculation (min) (ACUTE ONLY): 26 min  Charges:  $Gait Training: 8-22 mins $Therapeutic Activity: 8-22  mins                    Windell Norfolk, DPT, PN2   Supplemental Physical Therapist [place]    Pager [phone] Acute Rehab Office [phone]

## 2021-03-28 NOTE — Progress Notes (Addendum)
PROGRESS NOTE    Travis Conrad  OVZ:[id number] DOB: Jul 30, 1950 DOA: 02/19/2021 PCP: [name], MD    Brief Narrative:   Travis Conrad was admitted to the hospital with the working diagnosis of severe sepsis due to perforated diverticulitis/ intra-abdominal abscess. Now sp lapartomy, with colectomy, complicated with postoperative ileus.   70 year old male past medical history for atrial fibrillation, dyslipidemia, hypertension and diverticulosis who presented with nausea, vomiting and abdominal pain.  Reported several days of worse abdominal pain, and poor oral intake.  Because of severe symptoms he presented to the hospital.  On his initial physical examination his blood pressure was 84/65, heart rate 57, respiratory rate 20, oxygen saturation 91%, he was ill-looking appearing, heart S1-S2, present, irregular irregular, lungs clear to auscultation, abdomen tender to palpation in the mid abdomen, no lower extremity edema.   CT of the abdomen and pelvis with acute sigmoid diverticulitis with small extraluminal gas and fluid collection in the sigmoid mesocolon, 3.5 x 1.5 x 2.7 cm consistent with perforation and abscess.  Dilated proximal and decompressed distal small bowel loops, possible ileus.   9/4 developed atrial fibrillation with rapid ventricular response, required intravenous amiodarone infusion.   Follow-up CT 9/7 with increased size pelvic abscess. On 9/8 patient underwent CT-guided drainage of pelvic abscess.   09/10 Patient underwent exploratory laparotomy with sigmoid colectomy and diverting colostomy placement.  Postoperative ileus.   Urology and ID have been consulted for epididymitis.   9/19 Postoperative anemia: Required 1 unit packed red blood cells.    Positive left knee pain, positive effusion, underwent left knee aspiration and injection with good toleration.  Fluid with no crystals, gram stain no organisms.      09/26 NG tube has been removed.   09/27 patient with  vomiting and abdominal distention, abdominal radiographs with recurrent ileus, NG tube has replaced and connected to low intermittent suction.   10/06 Patient started on clear liquid diet, diet is being advanced slowly.   Assessment & Plan:   Principal Problem:   Diverticulitis of large intestine with perforation and abscess without bleeding Active Problems:   Diverticulitis of intestine with abscess   Atrial fibrillation, chronic (HCC)   Essential hypertension   Diverticulitis   AKI (acute kidney injury) ([place])   Severe sepsis with acute organ dysfunction (HCC)   Intra-abdominal abscess (HCC)   Epididymitis   Severe sepsis due to perforated diverticulitis, sp colectomy. Left psoas collection. Post operative ileus.   - CT 03/15/2021 shows continued complex fluid collection along left iliopsoas.  SBO with transition in mid jejunum.  Surgery consulted IR to evaluate for drainage of the left psoas abscess however, IR do not recommend aspirating at this point in time.  No pain today.  S/p Gastrografin study on 03/16/2021.  Patient being managed by intermittent NG tube clamping.  Remains on TPN.   -Antibiotics management per ID, completed Zosyn 10/7 -  CT 10/4 with ileus although improved distention of small bowel, stable psoas collection.     - Patient with persistent/ recurrent ileus,   Ileus managed by general surgery. NG tube on suction for couple times already, NGT is discontinued, and has been advanced to full liquid diet, and he is tolerating his Ensure, it will be increased to 3 times daily, he remains on TPN (concentrated due to volume overload). -Appetite and diet has slightly improved, continue with clear liquid diet, and nutritional supplements, TPN will be decreased by half today.  Post operative anemia/anemia of chronic disease. - sp  1 unit PRBC on 03/07/2021. -Transfuse for hemoglobin less than 7.  Paroxysmal atrial fibrillation/ HTN. Patient was on amiodarone and metoprolol  with good rate control. On enoxaparin for anticoagulation, holding DOAC until po intake more consistent.   -He is off Cardizem drip currently. - Due to significant wheezes, IV metoprolol was also discontinued on 03/21/2021.   -Currently on p.o. Cardizem and amiodarone, an episode of A. fib with RVR yesterday requiring IV Cardizem push, rate appears to be more controlled today, will continue with amiodarone, and will increase p.o. Cardizem 30>60 mg.    Right epididymitis resolved, completed therapy with doxycycline.  -Patient does report dysuria this morning, will check UA.   AKI with hypokalemia/ hyperkalemia,hyponatremia and hypophosphatemia.  Continue TPN per pharmacy protocol.  All electrolytes normal with AKI resolved.   Alcohol withdrawal.  Resolved.    Left knee arthritis.  \Clinically improved after steroid injection.  Pain is well controlled.   Essential hypertension:  Controlled. [name]  Dyspnea/atelectasis acute hypoxic respiratory failure:  - Continues to improve and feels much better.   - No more wheezes.  Off of oxygen.  Off steroids . - Continue to hold metoprolol.  Will reconsider resuming in next 1 to 2 days with lower dose, likely orally if he continues to do well with p.o. intake  Anasarca  -Volume status improving with concentrated TPN and IV Lasix.  Blood pressure started to become low, and he has diuresed around 10 L over last 48 hours, so I will decrease Lasix to once daily.      Status is: Inpatient  Remains inpatient appropriate because:Inpatient level of care appropriate due to severity of illness  Dispo: The patient is from: Home              Anticipated d/c is to: CIR              Patient currently is not medically stable to d/c.  Will be ready when cleared by general surgery   Difficult to place patient No   DVT prophylaxis: Enoxaparin   Code Status:    full  Family Communication: wife at bedside    Nutrition Status: Nutrition Problem:  Inadequate oral intake Etiology: acute illness (diverticulitis) Signs/Symptoms: per patient/family report, NPO status Interventions: Prostat, Boost Plus    Consultants:  Surgery  ID Urology Cardiology orthopedic  Subjective:  He is tolerating his Ensure, he does report some dysuria, otherwise no significant complaints.  Objective: Vitals:   03/28/21 0524 03/28/21 0600 03/28/21 0700 03/28/21 0800  BP: 123/67   116/69  Pulse:  88 95 87  Resp:  19 (!) 24 19  Temp:    98 F (36.7 C)  TempSrc:    Oral  SpO2:  92% 96% 97%  Weight:      Height:        Intake/Output Summary (Last 24 hours) at 03/28/2021 1143 Last data filed at 03/28/2021 0814 Gross per 24 hour  Intake 1243.36 ml  Output 4305 ml  Net -3061.64 ml   Filed Weights   03/10/21 0447 03/16/21 0343 03/28/21 0500  Weight: 102 kg 98.9 kg 93.6 kg    Examination:   Awake Alert, Oriented X 3, ihe remains with impaired cognition and insight.   Symmetrical Chest wall movement, diminished air entry at the bases RRR,No Gallops,Rubs or new Murmurs, No Parasternal Heave +ve B.Sounds, Abd Soft, wound VAC discontinued, colostomy in left abdomen. No Cyanosis, Clubbing, anasarca improved, currently with +2 edema bilaterally   Data Reviewed:  I have personally reviewed following labs and imaging studies  CBC: Recent Labs  Lab 03/24/21 1300 03/25/21 1043 03/26/21 0450 03/27/21 0148 03/28/21 0504  WBC 8.0 8.9 7.6 7.5 7.1  HGB 7.7* 8.4* 7.9* 7.8* 7.9*  HCT 25.2* 27.1* 25.5* 24.3* 25.2*  MCV 98.8 99.3 98.1 96.8 97.3  PLT 346 371 356 360 970   Basic Metabolic Panel: Recent Labs  Lab 03/24/21 0439 03/25/21 1043 03/26/21 0450 03/27/21 0148 03/28/21 0504  NA 138 136 134* 135 133*  K 3.7 4.1 4.0 4.3 4.5  CL 100 102 101 101 102  CO2 28 29 27 26 25   GLUCOSE 177* 114* 170* 141* 157*  BUN 44* 35* 30* 31* 29*  CREATININE 1.19 1.02 1.03 1.06 1.07  CALCIUM 8.3* 7.9* 7.7* 7.9* 8.1*  MG 2.3  --  2.1  --  2.1  PHOS  4.5  --  3.4  --  3.5   GFR: Estimated Creatinine Clearance: 76.3 mL/min (by C-G formula based on SCr of 1.07 mg/dL). Liver Function Tests: Recent Labs  Lab 03/24/21 0439 03/28/21 0504  AST 33 24  ALT 47* 39  ALKPHOS 88 77  BILITOT 0.6 0.6  PROT 6.1* 5.7*  ALBUMIN 1.7* 1.7*   No results for input(s): LIPASE, AMYLASE in the last 168 hours. No results for input(s): AMMONIA in the last 168 hours. Coagulation Profile: No results for input(s): INR, PROTIME in the last 168 hours. Cardiac Enzymes: No results for input(s): CKTOTAL, CKMB, CKMBINDEX, TROPONINI in the last 168 hours. BNP (last 3 results) No results for input(s): PROBNP in the last 8760 hours. HbA1C: No results for input(s): HGBA1C in the last 72 hours. CBG: Recent Labs  Lab 03/27/21 2001 03/27/21 2349 03/28/21 0409 03/28/21 0811 03/28/21 1120  GLUCAP 171* 149* 147* 116* 158*   Lipid Profile: Recent Labs    03/28/21 0504  TRIG 151*    Thyroid Function Tests: No results for input(s): TSH, T4TOTAL, FREET4, T3FREE, THYROIDAB in the last 72 hours. Anemia Panel: No results for input(s): VITAMINB12, FOLATE, FERRITIN, TIBC, IRON, RETICCTPCT in the last 72 hours.   Radiology Studies: I have reviewed all of the imaging during this hospital visit personally  Scheduled Meds:  (feeding supplement) PROSource Plus  30 mL Oral BID BM   acetaminophen  650 mg Oral Q6H   amiodarone  200 mg Oral Daily   Chlorhexidine Gluconate Cloth  6 each Topical Daily   diltiazem  30 mg Oral Q6H   docusate sodium  100 mg Oral BID   enoxaparin (LOVENOX) injection  100 mg Subcutaneous Q12H   feeding supplement  237 mL Oral TID BM   furosemide  40 mg Intravenous Daily   insulin aspart  0-15 Units Subcutaneous Q6H   lidocaine  1 patch Transdermal Q24H   lidocaine  1 patch Transdermal Q24H   methocarbamol  1,000 mg Oral QID   metoCLOPramide (REGLAN) injection  10 mg Intravenous Q6H   nicotine  21 mg Transdermal Daily   pantoprazole  sodium  40 mg Oral BID   sodium chloride flush  10 mL Intracatheter Q12H   Continuous Infusions:  sodium chloride Stopped (03/24/21 2259)   TPN ADULT (ION) 70 mL/hr at 03/27/21 1740   TPN ADULT (ION)       LOS: 36 days   Phillips Climes, MD [facility] Hospitalist

## 2021-03-28 NOTE — Progress Notes (Signed)
Inpatient Rehabilitation Admissions Coordinator   I met at bedside with patient and wife. Noted continued Heart rate 130's with therapy activities. Began full liquid diet. I await further progress with therapy to begin Auth for a possible Cir admit.  Danne Baxter, RN, MSN Rehab Admissions Coordinator [phone] 03/28/2021 3:20 PM

## 2021-03-28 NOTE — Progress Notes (Signed)
OT Cancellation Note  Patient Details Name: Travis Conrad MRN: [id number] DOB: September 14, 1950   Cancelled Treatment:    Reason Eval/Treat Not Completed: Medical issues which prohibited therapy. Pt's HR at rest 120, spoke with RN and we will not see patient today.  [place], OTR/L Acute Rehab Services Pager [phone] Office [phone]    Almon Register 03/28/2021, 3:28 PM

## 2021-03-28 NOTE — Progress Notes (Signed)
PT Cancellation Note  Patient Details Name: Travis Conrad MRN: [id number] DOB: 14-May-1951   Cancelled Treatment:    Reason Eval/Treat Not Completed: Patient declined, no reason specified Just got breakfast and wants to eat prior to PT- asks if I can return later this morning, will make every effort.   Windell Norfolk, DPT, PN2   Supplemental Physical Therapist [place]    Pager [phone] Acute Rehab Office [phone]

## 2021-03-28 NOTE — Progress Notes (Signed)
PHARMACIST - PHYSICIAN COMMUNICATION  DR:   Elgergawy  CONCERNING: IV to Oral Route Change Policy  RECOMMENDATION: This patient is receiving Protonix by the intravenous route.  Based on criteria approved by the Pharmacy and Therapeutics Committee, the intravenous medication(s) is/are being converted to the equivalent oral dose form(s).   DESCRIPTION: These criteria include: The patient is eating (either orally or via tube) and/or has been taking other orally administered medications for a least 24 hours The patient has no evidence of active gastrointestinal bleeding or impaired GI absorption (gastrectomy, short bowel, patient on TNA or NPO). Tolerating other PO medications.   If you have questions about this conversion, please contact the Pharmacy Department  []   [phone] )  [facility] []   [phone] )  [facility] [x]   [phone] )  [facility] []   [phone] )  [facility] []   [phone] )  [place] BS, PharmD, [place], [place] 03/28/2021 10:51 AM

## 2021-03-29 ENCOUNTER — Inpatient Hospital Stay (HOSPITAL_COMMUNITY): Payer: Medicare Other

## 2021-03-29 DIAGNOSIS — I482 Chronic atrial fibrillation, unspecified: Secondary | ICD-10-CM | POA: Diagnosis not present

## 2021-03-29 DIAGNOSIS — K5720 Diverticulitis of large intestine with perforation and abscess without bleeding: Secondary | ICD-10-CM | POA: Diagnosis not present

## 2021-03-29 DIAGNOSIS — K567 Ileus, unspecified: Secondary | ICD-10-CM

## 2021-03-29 DIAGNOSIS — K9189 Other postprocedural complications and disorders of digestive system: Secondary | ICD-10-CM

## 2021-03-29 LAB — TROPONIN I (HIGH SENSITIVITY)
Troponin I (High Sensitivity): 7 ng/L (ref <18)
Troponin I (High Sensitivity): 7 ng/L (ref <18)

## 2021-03-29 LAB — BASIC METABOLIC PANEL
Anion gap: 7 (ref 5–15)
BUN: 28 mg/dL — ABNORMAL HIGH (ref 8–23)
CO2: 25 mmol/L (ref 22–32)
Calcium: 8.3 mg/dL — ABNORMAL LOW (ref 8.9–10.3)
Chloride: 100 mmol/L (ref 98–111)
Creatinine, Ser: 0.99 mg/dL (ref 0.61–1.24)
GFR, Estimated: >60 mL/min (ref >60)
Glucose, Bld: 126 mg/dL — ABNORMAL HIGH (ref 70–99)
Potassium: 4.2 mmol/L (ref 3.5–5.1)
Sodium: 132 mmol/L — ABNORMAL LOW (ref 135–145)

## 2021-03-29 LAB — GLUCOSE, CAPILLARY
Glucose-Capillary: 125 mg/dL — ABNORMAL HIGH (ref 70–99)
Glucose-Capillary: 180 mg/dL — ABNORMAL HIGH (ref 70–99)
Glucose-Capillary: 130 mg/dL — ABNORMAL HIGH (ref 70–99)
Glucose-Capillary: 135 mg/dL — ABNORMAL HIGH (ref 70–99)

## 2021-03-29 LAB — MAGNESIUM: Magnesium: 2 mg/dL (ref 1.7–2.4)

## 2021-03-29 LAB — PHOSPHORUS: Phosphorus: 4 mg/dL (ref 2.5–4.6)

## 2021-03-29 MED ORDER — FAT EMUL FISH OIL/PLANT BASED 20% (SMOFLIPID)IV EMUL
INTRAVENOUS | Status: AC
  Filled 2021-03-29: qty 350

## 2021-03-29 MED ORDER — MUSCLE RUB 10-15 % EX CREA
TOPICAL_CREAM | CUTANEOUS | Status: DC | PRN
  Filled 2021-03-29 (×2): qty 85

## 2021-03-29 MED ORDER — FUROSEMIDE 20 MG PO TABS
20.0000 mg | ORAL_TABLET | Freq: Every day | ORAL | Status: DC
  Administered 2021-03-30 – 2021-04-06 (×8): 20 mg via ORAL
  Filled 2021-03-29 (×8): qty 1

## 2021-03-29 MED ORDER — MORPHINE SULFATE (PF) 2 MG/ML IV SOLN
2.0000 mg | INTRAVENOUS | Status: DC | PRN
  Administered 2021-03-29 – 2021-03-30 (×3): 2 mg via INTRAVENOUS
  Filled 2021-03-29 (×3): qty 1

## 2021-03-29 MED ORDER — BENZOCAINE 10 % MT GEL
Freq: Four times a day (QID) | OROMUCOSAL | Status: DC | PRN
  Filled 2021-03-29: qty 9

## 2021-03-29 MED ORDER — NITROGLYCERIN 0.4 MG SL SUBL
SUBLINGUAL_TABLET | SUBLINGUAL | Status: AC
  Administered 2021-03-29: 0.4 mg
  Filled 2021-03-29: qty 1

## 2021-03-29 MED ORDER — INSULIN ASPART 100 UNIT/ML IJ SOLN
0.0000 [IU] | Freq: Three times a day (TID) | INTRAMUSCULAR | Status: DC
Start: 2021-03-29 — End: 2021-04-06
  Administered 2021-03-29 (×2): 2 [IU] via SUBCUTANEOUS
  Administered 2021-03-31 (×3): 1 [IU] via SUBCUTANEOUS
  Administered 2021-04-01: 2 [IU] via SUBCUTANEOUS
  Administered 2021-04-01 – 2021-04-03 (×4): 1 [IU] via SUBCUTANEOUS

## 2021-03-29 MED ORDER — NITROGLYCERIN 0.4 MG SL SUBL: 0.4000 mg | SUBLINGUAL_TABLET | SUBLINGUAL | Status: DC | PRN

## 2021-03-29 NOTE — Plan of Care (Signed)
  Problem: Health Behavior/Discharge Planning: Goal: Ability to manage health-related needs will improve Outcome: Progressing   

## 2021-03-29 NOTE — Progress Notes (Signed)
PHARMACY - TOTAL PARENTERAL NUTRITION CONSULT NOTE  Indication: Prolonged ileus  Patient Measurements: Height: 6' (182.9 cm) Weight: 93.6 kg (206 lb 5.6 oz) IBW/kg (Calculated) : 77.6 TPN AdjBW (KG): 82.6 Body mass index is 27.99 kg/m.  Assessment:  69 YOM with lower abdominal pain/N/V found to have perforated diverticulitis. CT abd on 9/3 found to have active ileus/pSBO from diverticulitis/bowel inflammation. Pharmacy consulted to manage TPN for prolonged ileus. Of note patient has a history pf alcohol dependency and is at high risk for refeeding.   Glucose / Insulin: no hx DM, A1c 5.9% - CBGs < 180.  Off steroid on 10/5. Used 8 units SSI in past 24 hrs, 20 units insulin in TPN.  Electrolytes: Na 132 (max in TPN), K 4.2 (Lasix IV 40mg  daily), CoCa 9.9, others WNL Renal: SCr 0.99, BUN 28 Hepatic: LFTs / tbili  WNL, TG 151, albumin 1.7  Intake / Output; MIVF: UOP 2.6 ml/kg/hr with Lasix, net -31L per charting, colostomy not documented, on metoclopramide 10 IV Q 6 hr GI Imaging:  - 9/3 CT abd: active ileus/pSBO - 9/16 CT abd: L-iliopsoas fluid collection (abscess or hematoma), resolution of prior diverticular abscess, still w/ evidence of ileus - 9/27 CT abd: continued retroperitoneal complex fluid collection, SBO - 10/4 CT: Left iliopsoas enlargement heterogeneity persists > may represent intramuscular hematoma, infection or mass.  Ieus, degree of small-bowel distention has decreased, pSBO cannot be excluded. - 10/7 KUB: Dilated loops of small bowel are slightly more prominent than on the prior study. Gas is noted within the colon. - 10/10 KUB read pending  GI Surgeries / Procedures:  - 9/10 ex-lap, sigmoid colectomy w/ end colostomy creation, enterorrhaphy, drainage of intra-abd abscess, wound vac placement  Central access: PICC line 02/24/21 TPN start date: 02/25/21  Nutritional Goals, RD Estimated Needs Total Energy Estimated Needs: 2000-2200 Total Protein Estimated Needs: 100-110  grams Total Fluid Estimated Needs: >2L  Current Nutrition:  TPN 10/6 CLD started, minimal intake 10/8 Drank 1.5 Ensure  10/9 FLD- ate 2 Ensure, 3 spoonfuls of pudding and sorbet. Wife to bring in liquid foods patient might like better  10/10 per wife ate 1 Ensure, a little cream of chicken soup, a little of a milk shake (Milk+pudding+ice cream that wife made) 10/11 breakfast ate 4 bites of grits and 1 Ensure   Plan:  Continue concentrated TPN per surgery at 35 ml/hr (Half of goal) to provide 52 g AA, 176 g CHO and 20 g ILE for a total of 1015 kCal, meeting ~50% of patient's needs Electrolytes in TPN: Increase Mg 22 mEq/L; decrease Phos 22 mmol/L, ; Continue Na 133mEq/L, K 90 mEq/L, Ca 82mEq/L, Cl:Ac 1:2 Add standard MVI, trace elements, folate and thiamine to TPN Change to sensitive SSI TID with meals and 20 units regular insulin in TPN Standard TPN labs on Mon and Thurs  Watch diuresis and K  F/U PO intake/diet advancement    Benetta Spar, PharmD, BCPS, [facility] Clinical Pharmacist  Please check AMION for all [place] phone numbers After 10:00 PM, call [place]

## 2021-03-29 NOTE — Progress Notes (Signed)
Physical Therapy Treatment Patient Details Name: Travis Conrad MRN: [id number] DOB: 1950-09-26 Today's Date: 03/29/2021   History of Present Illness 70 yo admitted 9/3 with abdominal pain, vomiting and ileus. 9/4 Afib with RVR. 9/8 intrabdominal abscess s/p IR drain placed. 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 Pt found to have lt iliopsoas hematoma on CT 9/16.  9/20 left knee aspiration. 9/26 NG tube removed. 9/27 NGT replaced due to vomiting. 10/1 atelectasis. Pt with Afib and elevated HR.  Work up neg so far and MD cleared pt for therapy on 10/11.  PMhx: HTN, HLD, Afib, ETOH use    PT Comments    Patient received in bed, continues to have abdominal pain but ultimately cooperative with therapy. Able to mobilize with MinA/RW at most, and tolerated progression of gait training well today. HR still in Afib pattern but within much more reasonable boundaries (112BPM highest observed rate). Easily fatigued, but motivated- just very deconditioned. Left in bed in chair position (continues to refuse recliner) with alarm active. Continue to feel he'd really benefit from CIR.    Recommendations for follow up therapy are one component of a multi-disciplinary discharge planning process, led by the attending physician.  Recommendations may be updated based on patient status, additional functional criteria and insurance authorization.  Follow Up Recommendations  CIR;Supervision/Assistance - 24 hour     Equipment Recommendations  3in1 (PT)    Recommendations for Other Services       Precautions / Restrictions Precautions Precautions: Fall Precaution Comments: abdominal incision, wound VAC out 10/10, watch HR Restrictions Weight Bearing Restrictions: No     Mobility  Bed Mobility Overal bed mobility: Needs Assistance Bed Mobility: Rolling;Sidelying to Sit;Sit to Sidelying Rolling: Supervision Sidelying to sit: Min assist     Sit to  sidelying: Mod assist General bed mobility comments: continues to insist he cannot move L LE to EOB but able to do so well today. Needed reminders for technique for rolling prior to pushing up to midline sitting from his side    Transfers Overall transfer level: Needs assistance Equipment used: Rolling walker (2 wheeled) Transfers: Sit to/from Stand Sit to Stand: Min assist         General transfer comment: very light MinA and only slightly elevated bed, cues to remind him about hand placement  Ambulation/Gait Ambulation/Gait assistance: Min guard Gait Distance (Feet): 125 Feet Assistive device: Rolling walker (2 wheeled) Gait Pattern/deviations: Step-through pattern;Trunk flexed Gait velocity: Decreased   General Gait Details: did much better with gait today- tolerated progression of gait distance with HR no more than 112BPM with activity but still SOB/easily fatigued. Balance better today as well   Stairs             Wheelchair Mobility    Modified Rankin (Stroke Patients Only)       Balance Overall balance assessment: Needs assistance Sitting-balance support: No upper extremity supported Sitting balance-Leahy Scale: Good Sitting balance - Comments: EOB without UE support   Standing balance support: Bilateral upper extremity supported;During functional activity Standing balance-Leahy Scale: Poor Standing balance comment: reliant on RW in standing                            Cognition Arousal/Alertness: Awake/alert Behavior During Therapy: WFL for tasks assessed/performed Overall Cognitive Status: Within Functional Limits for tasks assessed  General Comments: cognition appears to have improved, cooperative with therapist and follows cues well      Exercises      General Comments        Pertinent Vitals/Pain Pain Assessment: Faces Faces Pain Scale: Hurts little more Pain Location:  abdomen Pain Descriptors / Indicators: Discomfort;Grimacing Pain Intervention(s): Limited activity within patient's tolerance;Monitored during session    Home Living                      Prior Function            PT Goals (current goals can now be found in the care plan section) Acute Rehab PT Goals Patient Stated Goal: to get back to baseline PT Goal Formulation: With patient/family Time For Goal Achievement: 04/11/21 Potential to Achieve Goals: Good Progress towards PT goals: Progressing toward goals    Frequency    Min 3X/week      PT Plan Current plan remains appropriate    Co-evaluation              AM-PAC PT "6 Clicks" Mobility   Outcome Measure  Help needed turning from your back to your side while in a flat bed without using bedrails?: A Little Help needed moving from lying on your back to sitting on the side of a flat bed without using bedrails?: A Little Help needed moving to and from a bed to a chair (including a wheelchair)?: A Little Help needed standing up from a chair using your arms (e.g., wheelchair or bedside chair)?: A Little Help needed to walk in hospital room?: A Little Help needed climbing 3-5 steps with a railing? : A Lot 6 Click Score: 17    End of Session Equipment Utilized During Treatment: Gait belt Activity Tolerance: Patient tolerated treatment well Patient left: in bed;with call bell/phone within reach;with bed alarm set (bed in chair position, refuses up to recliner) Nurse Communication: Mobility status PT Visit Diagnosis: Other abnormalities of gait and mobility (R26.89);Difficulty in walking, not elsewhere classified (R26.2)     Time: 7473-4037 PT Time Calculation (min) (ACUTE ONLY): 26 min  Charges:  $Gait Training: 8-22 mins $Therapeutic Activity: 8-22 mins                    Windell Norfolk, DPT, PN2   Supplemental Physical Therapist [place]    Pager [phone] Acute Rehab Office [phone]

## 2021-03-29 NOTE — Progress Notes (Signed)
Progress Note  31 Days Post-Op  Subjective: Patient reports chest pain this AM, seems to be worse with deep inspiration. Not related to PO intake. He is tolerating FLD and having good stool output from stoma. Wife is at the bedside this AM.   Objective: Vital signs in last 24 hours: Temp:  [97.3 F (36.3 C)-98.5 F (36.9 C)] 98.5 F (36.9 C) (10/11 0800) Pulse Rate:  [87-122] 87 (10/11 0800) Resp:  [18-29] 20 (10/11 0800) BP: (125-135)/(47-62) 125/47 (10/11 0800) SpO2:  [94 %-98 %] 98 % (10/11 0800) Last BM Date: 03/27/21  Intake/Output from previous day: 10/10 0701 - 10/11 0700 In: 120 [P.O.:120] Out: 5750 [Urine:5750] Intake/Output this shift: No intake/output data recorded.  PE: Gen:  Alert, NAD, pleasant Pulm:  Normal rate and effort.  Abd: Soft, ND, NT, +BS. Stoma appears viable with stool present in ostomy appliance. Midline wound clean with granulation tissue present    Lab Results:  Recent Labs    03/27/21 0148 03/28/21 0504  WBC 7.5 7.1  HGB 7.8* 7.9*  HCT 24.3* 25.2*  PLT 360 382   BMET Recent Labs    03/28/21 0504 03/29/21 0146  NA 133* 132*  K 4.5 4.2  CL 102 100  CO2 25 25  GLUCOSE 157* 126*  BUN 29* 28*  CREATININE 1.07 0.99  CALCIUM 8.1* 8.3*   PT/INR No results for input(s): LABPROT, INR in the last 72 hours. CMP     Component Value Date/Time   NA 132 (L) 03/29/2021 0146   NA 134 09/30/2019 0959   NA 142 03/03/2016 0921   K 4.2 03/29/2021 0146   K 4.3 03/03/2016 0921   CL 100 03/29/2021 0146   CO2 25 03/29/2021 0146   CO2 25 03/03/2016 0921   GLUCOSE 126 (H) 03/29/2021 0146   GLUCOSE 89 03/03/2016 0921   BUN 28 (H) 03/29/2021 0146   BUN 11 09/30/2019 0959   BUN 13.6 03/03/2016 0921   CREATININE 0.99 03/29/2021 0146   CREATININE 1.2 03/03/2016 0921   CALCIUM 8.3 (L) 03/29/2021 0146   CALCIUM 9.6 03/03/2016 0921   PROT 5.7 (L) 03/28/2021 0504   PROT 7.3 03/03/2016 0921   ALBUMIN 1.7 (L) 03/28/2021 0504   ALBUMIN 3.8  03/03/2016 0921   AST 24 03/28/2021 0504   AST 36 (H) 03/03/2016 0921   ALT 39 03/28/2021 0504   ALT 31 03/03/2016 0921   ALKPHOS 77 03/28/2021 0504   ALKPHOS 116 03/03/2016 0921   BILITOT 0.6 03/28/2021 0504   BILITOT 0.71 03/03/2016 0921   GFRNONAA >60 03/29/2021 0146   GFRNONAA 67 07/25/2013 1712   GFRAA >60 10/03/2019 0930   GFRAA 77 07/25/2013 1712   Lipase     Component Value Date/Time   LIPASE 22 02/19/2021 1653       Studies/Results: DG Abd Portable 1V  Result Date: 03/28/2021 CLINICAL DATA:  Postoperative ileus. EXAM: PORTABLE ABDOMEN - 1 VIEW COMPARISON:  March 25, 2021. FINDINGS: The bowel gas pattern is normal. No radio-opaque calculi or other significant radiographic abnormality are seen. IMPRESSION: Negative. Electronically Signed   By: Marijo Conception M.D.   On: 03/28/2021 09:49    Anti-infectives: Anti-infectives (From admission, onward)    Start     Dose/Rate Route Frequency Ordered Stop   03/09/21 0800  piperacillin-tazobactam (ZOSYN) IVPB 3.375 g        3.375 g 12.5 mL/hr over 240 Minutes Intravenous Every 8 hours 03/09/21 0706 03/25/21 2034   02/26/21 0100  doxycycline (VIBRAMYCIN) 100 mg in sodium chloride 0.9 % 250 mL IVPB        100 mg 125 mL/hr over 120 Minutes Intravenous 2 times daily 02/26/21 0005 03/08/21 2359   02/20/21 0300  piperacillin-tazobactam (ZOSYN) IVPB 3.375 g        3.375 g 12.5 mL/hr over 240 Minutes Intravenous Every 8 hours 02/19/21 1907 03/08/21 2359   02/19/21 1900  ceFEPIme (MAXIPIME) 2 g in sodium chloride 0.9 % 100 mL IVPB  Status:  Discontinued        2 g 200 mL/hr over 30 Minutes Intravenous  Once 02/19/21 1857 02/19/21 1906   02/19/21 1900  metroNIDAZOLE (FLAGYL) IVPB 500 mg  Status:  Discontinued        500 mg 100 mL/hr over 60 Minutes Intravenous  Once 02/19/21 1857 02/19/21 1906   02/19/21 1815  piperacillin-tazobactam (ZOSYN) IVPB 3.375 g        3.375 g 100 mL/hr over 30 Minutes Intravenous  Once 02/19/21 1809  02/19/21 1944        Assessment/Plan POD31 s/p ex lap, sigmoid colectomy, end colostomy with enterorrhaphy and drainage of intraabdominal abscess - Dr. Kieth Brightly and Dr. Radene Knee 9/10 for Acute sigmoid diverticulitis with contained perforation and abscess - CT 9/16 demonstrates heterogenous left iliopsoas collection likely hematoma, stranding in the retroperitoneum and subcu tissue.  Distal colon appears decompressed and very small caliber but there is contrast entering this area; there is additionally a similarly small-caliber segment of colon in the proximal transverse and the ascending colon is not dilated- suspect over all this represents small bowel/gastric ileus.  IR does not recommend draining the hematoma at this point. ID following and recommending Zosyn through 10/17  - NGT replaced 9/27  - CT 9/27 with continued complex fluid collection along left psoas, SBO with transition in mid jejunum, post-surgical changes of colon resection with colostomy - Was started on reglan last weekend. Discussed with him the risk of tardive dyskinesia/EPS on 9/20. Patient stated understanding and is okay with continuing Reglan.  - Gastrograffin study 9/28 showed Stable gaseous distention of the small bowel, oral contrast progressed into the colon - CT 10/4 with ileus although improved distention of small bowel, stable psoas collection - tolerating FLD and ensure - continue 1/2 rate TPN and advance to soft diet today; If tolerating well then likely stop TPN tomorrow and initiate calorie count to make sure patient is taking in enough - start transitioning to PO pain meds - WOCN following for new ostomy teaching, daily WTD to midline wound  - Encouraged ambulation (working with therapies), use IS, multimodal pain control  - Cont abx per ID recs   FEN - soft diet, ensure, 1/2 rate TPN VTE - SCDs, therapeutic lovenox  ID - Zosyn 9/4 - 10/17 (per ID) Foley - out, voiding. complained of some dysuria today - UA  yesterday with some hematuria but not concerning for infection  LOS: 37 days    Norm Parcel, [facility] 03/29/2021, 11:03 AM Please see Amion for pager number during day hours 7:00am-4:30pm

## 2021-03-29 NOTE — Progress Notes (Signed)
PROGRESS NOTE    Travis Conrad  ZOX:[id number] DOB: 09-30-1950 DOA: 02/19/2021 PCP: [name], MD    Brief Narrative:   Travis Conrad was admitted to the hospital with the working diagnosis of severe sepsis due to perforated diverticulitis/ intra-abdominal abscess. Now sp lapartomy, with colectomy, complicated with postoperative ileus.   70 year old male past medical history for atrial fibrillation, dyslipidemia, hypertension and diverticulosis who presented with nausea, vomiting and abdominal pain.  Reported several days of worse abdominal pain, and poor oral intake.  Because of severe symptoms he presented to the hospital.  On his initial physical examination his blood pressure was 84/65, heart rate 57, respiratory rate 20, oxygen saturation 91%, he was ill-looking appearing, heart S1-S2, present, irregular irregular, lungs clear to auscultation, abdomen tender to palpation in the mid abdomen, no lower extremity edema.   CT of the abdomen and pelvis with acute sigmoid diverticulitis with small extraluminal gas and fluid collection in the sigmoid mesocolon, 3.5 x 1.5 x 2.7 cm consistent with perforation and abscess.  Dilated proximal and decompressed distal small bowel loops, possible ileus.   9/4 developed atrial fibrillation with rapid ventricular response, required intravenous amiodarone infusion.   Follow-up CT 9/7 with increased size pelvic abscess. On 9/8 patient underwent CT-guided drainage of pelvic abscess.   09/10 Patient underwent exploratory laparotomy with sigmoid colectomy and diverting colostomy placement.  Postoperative ileus.   Urology and ID have been consulted for epididymitis.   9/19 Postoperative anemia: Required 1 unit packed red blood cells.    Positive left knee pain, positive effusion, underwent left knee aspiration and injection with good toleration.  Fluid with no crystals, gram stain no organisms.      09/26 NG tube has been removed.   09/27 patient with  vomiting and abdominal distention, abdominal radiographs with recurrent ileus, NG tube has replaced and connected to low intermittent suction.   10/06 Patient started on clear liquid diet, diet is being advanced slowly.   Assessment & Plan:   Principal Problem:   Diverticulitis of large intestine with perforation and abscess without bleeding Active Problems:   Diverticulitis of intestine with abscess   Atrial fibrillation, chronic (HCC)   Essential hypertension   Diverticulitis   AKI (acute kidney injury) ([place])   Severe sepsis with acute organ dysfunction (HCC)   Intra-abdominal abscess (HCC)   Epididymitis   Severe sepsis due to perforated diverticulitis, sp colectomy. Left psoas collection. Post operative ileus.   - CT 03/15/2021 shows continued complex fluid collection along left iliopsoas.  SBO with transition in mid jejunum.  Surgery consulted IR to evaluate for drainage of the left psoas abscess however, IR do not recommend aspirating at this point in time.  No pain today.  S/p Gastrografin study on 03/16/2021.  Patient being managed by intermittent NG tube clamping.  Remains on TPN.   -Antibiotics management per ID, completed Zosyn 10/7 -  CT 10/4 with ileus although improved distention of small bowel, stable psoas collection.     - Patient with persistent/ recurrent ileus,   Ileus managed by general surgery. NG tube on suction for couple times already, NGT is discontinued, and has been advanced to full liquid diet, and he is tolerating his Ensure, it will be increased to 3 times daily, he remains on TPN (concentrated due to volume overload). -Diet and oral intake has been improving, tolerating full liquid diet, and Ensure, to be advanced to soft diet today, and continue with TPN at half rate .  Post  operative anemia/anemia of chronic disease. - sp 1 unit PRBC on 03/07/2021. -Transfuse for hemoglobin less than 7.  Paroxysmal atrial fibrillation/ HTN. Patient was on amiodarone and  metoprolol with good rate control. On enoxaparin for anticoagulation, holding DOAC until po intake more consistent.   -He is off Cardizem drip currently. - Due to significant wheezes, IV metoprolol was also discontinued on 03/21/2021.   -Currently on p.o. Cardizem and amiodarone, heart rate better controlled after increasing Cardizem to 60 mg every 6 hours.    An episode of A. fib with RVR yesterday requiring IV Cardizem push, rate appears to be more controlled today, will continue with amiodarone, and will increase p.o. Cardizem 30>60 mg.    Right epididymitis resolved, completed therapy with doxycycline.   AKI with hypokalemia/ hyperkalemia,hyponatremia and hypophosphatemia.  Continue TPN per pharmacy protocol.  All electrolytes normal with AKI resolved.   Alcohol withdrawal.  Resolved.    Left knee arthritis.  \Clinically improved after steroid injection.  Pain is well controlled.   Essential hypertension:  Controlled. [name]  Dyspnea/atelectasis acute hypoxic respiratory failure:  - Continues to improve and feels much better.   - No more wheezes.  Off of oxygen.  Off steroids . - Continue to hold metoprolol.  Will reconsider resuming in next 1 to 2 days with lower dose, likely orally if he continues to do well with p.o. intake  Anasarca  -Volume status improving with concentrated TPN and IV Lasix.  Over last 72 hours patient is almost -15 L, so I will decrease his Lasix to 20 mg oral daily.     Status is: Inpatient  Remains inpatient appropriate because:Inpatient level of care appropriate due to severity of illness  Dispo: The patient is from: Home              Anticipated d/c is to: CIR              Patient currently is not medically stable to d/c.  Will be ready when cleared by general surgery   Difficult to place patient No   DVT prophylaxis: Enoxaparin   Code Status:    full  Family Communication: wife at bedside 10/10.    Nutrition Status: Nutrition Problem:  Inadequate oral intake Etiology: acute illness (diverticulitis) Signs/Symptoms: per patient/family report, NPO status Interventions: TPN, Ensure Enlive (each supplement provides 350kcal and 20 grams of protein), Prostat, Magic cup    Consultants:  Surgery  ID Urology Cardiology orthopedic  Subjective:  Tolerating his Ensure, clear liquid diet, has good colostomy output, he had complaints of musculoskeletal chest pain in the right side this morning, currently resolved.  Objective: Vitals:   03/29/21 0700 03/29/21 0800 03/29/21 0900 03/29/21 1000  BP: 135/62 (!) 125/47 (!) 128/37   Pulse: 92 87 99 100  Resp: 20 20    Temp:  98.5 F (36.9 C)    TempSrc:  Oral    SpO2: 98% 98%    Weight:      Height:        Intake/Output Summary (Last 24 hours) at 03/29/2021 1355 Last data filed at 03/29/2021 0447 Gross per 24 hour  Intake 120 ml  Output 2750 ml  Net -2630 ml   Filed Weights   03/10/21 0447 03/16/21 0343 03/28/21 0500  Weight: 102 kg 98.9 kg 93.6 kg    Examination:   Awake Alert, Oriented X 3, frail, slightly impaired judgment. Symmetrical Chest wall movement, diminished at the bases RRR,No Gallops,Rubs or new Murmurs, No Parasternal Heave +  ve B.Sounds, Abd Soft, No tenderness,colostomy present, wound VAC discontinued. No Cyanosis, Clubbing , [place] significantly improved, +1 edema in lower extremity.   Data Reviewed: I have personally reviewed following labs and imaging studies  CBC: Recent Labs  Lab 03/24/21 1300 03/25/21 1043 03/26/21 0450 03/27/21 0148 03/28/21 0504  WBC 8.0 8.9 7.6 7.5 7.1  HGB 7.7* 8.4* 7.9* 7.8* 7.9*  HCT 25.2* 27.1* 25.5* 24.3* 25.2*  MCV 98.8 99.3 98.1 96.8 97.3  PLT 346 371 356 360 353   Basic Metabolic Panel: Recent Labs  Lab 03/24/21 0439 03/25/21 1043 03/26/21 0450 03/27/21 0148 03/28/21 0504 03/29/21 0146  NA 138 136 134* 135 133* 132*  K 3.7 4.1 4.0 4.3 4.5 4.2  CL 100 102 101 101 102 100  CO2 28 29 27 26 25 25    GLUCOSE 177* 114* 170* 141* 157* 126*  BUN 44* 35* 30* 31* 29* 28*  CREATININE 1.19 1.02 1.03 1.06 1.07 0.99  CALCIUM 8.3* 7.9* 7.7* 7.9* 8.1* 8.3*  MG 2.3  --  2.1  --  2.1 2.0  PHOS 4.5  --  3.4  --  3.5 4.0   GFR: Estimated Creatinine Clearance: 82.5 mL/min (by C-G formula based on SCr of 0.99 mg/dL). Liver Function Tests: Recent Labs  Lab 03/24/21 0439 03/28/21 0504  AST 33 24  ALT 47* 39  ALKPHOS 88 77  BILITOT 0.6 0.6  PROT 6.1* 5.7*  ALBUMIN 1.7* 1.7*   No results for input(s): LIPASE, AMYLASE in the last 168 hours. No results for input(s): AMMONIA in the last 168 hours. Coagulation Profile: No results for input(s): INR, PROTIME in the last 168 hours. Cardiac Enzymes: No results for input(s): CKTOTAL, CKMB, CKMBINDEX, TROPONINI in the last 168 hours. BNP (last 3 results) No results for input(s): PROBNP in the last 8760 hours. HbA1C: No results for input(s): HGBA1C in the last 72 hours. CBG: Recent Labs  Lab 03/28/21 1120 03/28/21 1632 03/28/21 2325 03/29/21 0446 03/29/21 1121  GLUCAP 158* 143* 121* 125* 180*   Lipid Profile: Recent Labs    03/28/21 0504  TRIG 151*    Thyroid Function Tests: No results for input(s): TSH, T4TOTAL, FREET4, T3FREE, THYROIDAB in the last 72 hours. Anemia Panel: No results for input(s): VITAMINB12, FOLATE, FERRITIN, TIBC, IRON, RETICCTPCT in the last 72 hours.   Radiology Studies: I have reviewed all of the imaging during this hospital visit personally  Scheduled Meds:  (feeding supplement) PROSource Plus  30 mL Oral TID BM   acetaminophen  650 mg Oral Q6H   amiodarone  200 mg Oral Daily   Chlorhexidine Gluconate Cloth  6 each Topical Daily   diltiazem  60 mg Oral Q6H   docusate sodium  100 mg Oral BID   enoxaparin (LOVENOX) injection  100 mg Subcutaneous Q12H   feeding supplement  237 mL Oral TID BM   furosemide  40 mg Intravenous Daily   insulin aspart  0-9 Units Subcutaneous TID WC   lidocaine  1 patch  Transdermal Q24H   lidocaine  1 patch Transdermal Q24H   methocarbamol  1,000 mg Oral QID   metoCLOPramide (REGLAN) injection  10 mg Intravenous Q6H   nicotine  21 mg Transdermal Daily   pantoprazole sodium  40 mg Oral BID   sodium chloride flush  10 mL Intracatheter Q12H   Continuous Infusions:  sodium chloride Stopped (03/24/21 2259)   TPN ADULT (ION) 35 mL/hr at 03/28/21 1714   TPN ADULT (ION)  LOS: 37 days   Phillips Climes, MD [facility] Hospitalist

## 2021-03-29 NOTE — Progress Notes (Signed)
Occupational Therapy Treatment Patient Details Name: Travis Conrad MRN: [id number] DOB: 09-21-1950 Today's Date: 03/29/2021   History of present illness 70 yo admitted 9/3 with abdominal pain, vomiting and ileus. 9/4 Afib with RVR. 9/8 intrabdominal abscess s/p IR drain placed. 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 Pt found to have lt iliopsoas hematoma on CT 9/16.  9/20 left knee aspiration. 9/26 NG tube removed. 9/27 NGT replaced due to vomiting. 10/1 atelectasis. Pt with Afib and elevated HR.  Work up neg so far and MD cleared pt for therapy on 10/11.  PMhx: HTN, HLD, Afib, ETOH use   OT comments  Pt making slow but steady progress with all adls. Pt limited the last two days with high HR but, today, was able to tolerate more activity and participated with encouragement. Pt would continue to benefit from rehab to achieve maximal independence and help educate the wife to let pt do for himself.  Will continue to see with focus on adls in standing and ambulating during adls in room.    Recommendations for follow up therapy are one component of a multi-disciplinary discharge planning process, led by the attending physician.  Recommendations may be updated based on patient status, additional functional criteria and insurance authorization.    Follow Up Recommendations  CIR    Equipment Recommendations  3 in 1 bedside commode    Recommendations for Other Services      Precautions / Restrictions Precautions Precautions: Fall Precaution Comments: abdominal incision, wound VAC out 10/10, watch HR Restrictions Weight Bearing Restrictions: No       Mobility Bed Mobility Overal bed mobility: Needs Assistance Bed Mobility: Supine to Sit     Supine to sit: Min assist     General bed mobility comments: Pt states he cannot do tasks before he tries.  Stated he could not move [name] to EOB but did so without physical assist.  Cues  required to continue to move to EOB and get B feet on floor.    Transfers Overall transfer level: Needs assistance Equipment used: Rolling walker (2 wheeled) Transfers: Sit to/from [facility] Sit to Stand: Min guard;From elevated surface Stand pivot transfers: Min guard       General transfer comment: cues for hand placement, elevated bed.    Balance Overall balance assessment: Needs assistance Sitting-balance support: No upper extremity supported Sitting balance-Leahy Scale: Good Sitting balance - Comments: EOB without UE support   Standing balance support: Bilateral upper extremity supported;During functional activity Standing balance-Leahy Scale: Poor Standing balance comment: reliant on RW in standing                           ADL either performed or assessed with clinical judgement   ADL Overall ADL's : Needs assistance/impaired Eating/Feeding: Minimal assistance;Sitting Eating/Feeding Details (indicate cue type and reason): Pt states his tremor keeps him from feeding self and he likes wife to feed him. Pt able to drink from cup with LUE without issue. Pt should be able to feed self. Encouraged wife and pt to allow for his independence. Will provide adaptive equipment if needed but do not feel it is needed a this time. Grooming: Wash/dry hands;Wash/dry face;Oral care;Set up;Sitting Grooming Details (indicate cue type and reason): Pt with tremor but able to complete grooming tasks without physical assist.             Lower Body Dressing: Moderate assistance;Cueing  for compensatory techniques;Sit to/from stand Lower Body Dressing Details (indicate cue type and reason): Pt sat figure 4 today with only min assist to cross [name] and donned and doffed socks.  Pt stood with min assist to manage LE clothing. Toilet Transfer: Psychologist, sport and exercise Details (indicate cue type and reason): stand pivot transfer from EOB to recliner;pt with  ostomy and condom catheter Toileting- Clothing Manipulation and Hygiene: Moderate assistance Toileting - Clothing Manipulation Details (indicate cue type and reason): modA to wash bottom while standing.  Pt has difficulty letting go of walker to clean self in standing.     Functional mobility during ADLs: Minimal assistance;Rolling walker General ADL Comments: Pt had been fatigued the last few days with high HR. HR better today up to 116 at highest but stil mildly SOB.  Pt discouraged but with encouragement did participate well.     Vision   Vision Assessment?: No apparent visual deficits   Perception     Praxis      Cognition Arousal/Alertness: Awake/alert Behavior During Therapy: WFL for tasks assessed/performed Overall Cognitive Status: Within Functional Limits for tasks assessed                                 General Comments: cognition appears to have improved        Exercises     Shoulder Instructions       General Comments Pt continues to be deconditioned and wife doing more for him than necessary.  Feel rehab is till a good option to maximize pt's independence.    Pertinent Vitals/ Pain       Pain Assessment: Faces Faces Pain Scale: Hurts little more Pain Location: abdomen Pain Descriptors / Indicators: Discomfort;Grimacing Pain Intervention(s): Monitored during session;Repositioned;Limited activity within patient's tolerance  Home Living                                          Prior Functioning/Environment              Frequency  Min 2X/week        Progress Toward Goals  OT Goals(current goals can now be found in the care plan section)  Progress towards OT goals: Progressing toward goals  Acute Rehab OT Goals Patient Stated Goal: to get back to baseline OT Goal Formulation: With patient Time For Goal Achievement: 04/12/21 Potential to Achieve Goals: Good ADL Goals Pt Will Perform Grooming: with modified  independence;standing Pt Will Perform Lower Body Bathing: with modified independence;sit to/from stand Pt Will Perform Lower Body Dressing: with modified independence;with adaptive equipment Pt Will Transfer to Toilet: with supervision;ambulating Pt Will Perform Toileting - Clothing Manipulation and hygiene: with supervision;sitting/lateral leans;sit to/from stand Additional ADL Goal #1: Pt will independently verbalize 3 strateiges to reduce risk of falls  Plan      Co-evaluation                 AM-PAC OT "6 Clicks" Daily Activity     Outcome Measure   Help from another person eating meals?: A Little Help from another person taking care of personal grooming?: None Help from another person toileting, which includes using toliet, bedpan, or urinal?: A Little Help from another person bathing (including washing, rinsing, drying)?: A Little Help from another person to put on and taking off regular upper  body clothing?: A Little Help from another person to put on and taking off regular lower body clothing?: A Lot 6 Click Score: 18    End of Session Equipment Utilized During Treatment: Rolling walker;Oxygen  OT Visit Diagnosis: Muscle weakness (generalized) (M62.81);Pain Pain - Right/Left: Left Pain - part of body: Leg   Activity Tolerance Patient tolerated treatment well   Patient Left with call bell/phone within reach;with bed alarm set;with family/visitor present;in chair   Nurse Communication Mobility status        Time: 4158-3094 OT Time Calculation (min): 28 min  Charges: OT General Charges $OT Visit: 1 Visit OT Treatments $Self Care/Home Management : 23-37 mins    Glenford Peers 03/29/2021, 9:47 AM

## 2021-03-29 NOTE — Progress Notes (Signed)
ANTICOAGULATION CONSULT NOTE - Follow Up Consult  Pharmacy Consult for Lovenox Indication: atrial fibrillation  Allergies  Allergen Reactions   Atorvastatin Other (See Comments)    myalgias    Patient Measurements: Height: 6' (182.9 cm) Weight: 93.6 kg (206 lb 5.6 oz) IBW/kg (Calculated) : 77.6  Vital Signs: Temp: 98.5 F (36.9 C) (10/11 0800) Temp Source: Oral (10/11 0800) BP: 125/47 (10/11 0800) Pulse Rate: 87 (10/11 0800)  Labs: Recent Labs    03/27/21 0148 03/28/21 0504 03/29/21 0146  HGB 7.8* 7.9*  --   HCT 24.3* 25.2*  --   PLT 360 382  --   CREATININE 1.06 1.07 0.99     Estimated Creatinine Clearance: 82.5 mL/min (by C-G formula based on SCr of 0.99 mg/dL).   Assessment: 70 y/o male admitted for sepsis from contained perforated diverticulitis and intra-abdominal abscess with active ileus. On PTA Xarelto for Afib with last dose on 9/2 at 2000.  Pharmacy consulted for Lovenox due to post operative ileus.   CT 9/27 with continued complex fluid collection along left psoas. Per IR note no aspiration/drainage recommended. Patient is still NPO and cannot transition to an oral agent at this time. Missed 10/1 AM enoxaparin dose. Since he missed his dose, he has recent postoperative anemia requiring PRBC, and his level is technically therapeutic, keep at current dose and reassess anti-xa level if needed.  10/2: peak Anti-xa = 0.61 (~3h after AM dose) and therapeutic.  No bleeding noted, Hgb down to 7.4 on 10/5, platelets are stable.  Goal of Therapy:  Anti-Xa level Goal:  0.6-1 units/mL 4 hours post dose Monitor platelets by anticoagulation protocol: Yes   Plan:  Lovenox 100 mg SQ q12h Reassess anti-xa level as needed CBC at least q72h while on Lovenox F/U ability to transition to oral agent   Thank you for involving pharmacy in this patient's care.  Valera Vallas BS, PharmD, BCPS, Rph  **Pharmacist phone directory can be found on [email or web address] listed under [place]**

## 2021-03-30 DIAGNOSIS — N179 Acute kidney failure, unspecified: Secondary | ICD-10-CM | POA: Diagnosis not present

## 2021-03-30 DIAGNOSIS — Z4659 Encounter for fitting and adjustment of other gastrointestinal appliance and device: Secondary | ICD-10-CM

## 2021-03-30 DIAGNOSIS — M25562 Pain in left knee: Secondary | ICD-10-CM

## 2021-03-30 DIAGNOSIS — I482 Chronic atrial fibrillation, unspecified: Secondary | ICD-10-CM | POA: Diagnosis not present

## 2021-03-30 DIAGNOSIS — K5720 Diverticulitis of large intestine with perforation and abscess without bleeding: Secondary | ICD-10-CM | POA: Diagnosis not present

## 2021-03-30 LAB — URINE CULTURE: Culture: >=100000 — AB

## 2021-03-30 LAB — GLUCOSE, CAPILLARY
Glucose-Capillary: 116 mg/dL — ABNORMAL HIGH (ref 70–99)
Glucose-Capillary: 131 mg/dL — ABNORMAL HIGH (ref 70–99)
Glucose-Capillary: 133 mg/dL — ABNORMAL HIGH (ref 70–99)
Glucose-Capillary: 124 mg/dL — ABNORMAL HIGH (ref 70–99)

## 2021-03-30 MED ORDER — LINEZOLID 600 MG PO TABS
600.0000 mg | ORAL_TABLET | Freq: Two times a day (BID) | ORAL | Status: AC
  Administered 2021-03-30 – 2021-04-03 (×10): 600 mg via ORAL
  Filled 2021-03-30 (×11): qty 1

## 2021-03-30 MED ORDER — TRACE MINERALS CU-MN-SE-ZN 300-55-60-3000 MCG/ML IV SOLN
INTRAVENOUS | Status: AC
  Filled 2021-03-30: qty 704

## 2021-03-30 NOTE — Progress Notes (Signed)
Inpatient Rehabilitation Admissions Coordinator   I will begin Auth with [facility] Medicare today for a possible CIR admit pending insurance approval when patient medically ready.  Danne Baxter, RN, MSN Rehab Admissions Coordinator [phone] 03/30/2021 9:06 AM

## 2021-03-30 NOTE — Plan of Care (Signed)
  Problem: Health Behavior/Discharge Planning: Goal: Ability to manage health-related needs will improve Outcome: Progressing   Problem: Clinical Measurements: Goal: Ability to maintain clinical measurements within normal limits will improve Outcome: Progressing Goal: Will remain free from infection Outcome: Progressing Goal: Diagnostic test results will improve Outcome: Progressing Goal: Respiratory complications will improve Outcome: Progressing Goal: Cardiovascular complication will be avoided Outcome: Progressing   Problem: Activity: Goal: Risk for activity intolerance will decrease Outcome: Progressing   Problem: Nutrition: Goal: Adequate nutrition will be maintained Outcome: Progressing   Problem: Coping: Goal: Level of anxiety will decrease Outcome: Progressing   Problem: Elimination: Goal: Will not experience complications related to bowel motility Outcome: Progressing Goal: Will not experience complications related to urinary retention Outcome: Progressing   Problem: Pain Managment: Goal: General experience of comfort will improve Outcome: Progressing   Problem: Safety: Goal: Ability to remain free from injury will improve Outcome: Progressing   Problem: Skin Integrity: Goal: Risk for impaired skin integrity will decrease Outcome: Progressing   Problem: Education: Goal: Knowledge of disease or condition will improve Outcome: Progressing Goal: Understanding of medication regimen will improve Outcome: Progressing Goal: Individualized Educational Video(s) Outcome: Progressing   Problem: Activity: Goal: Ability to tolerate increased activity will improve Outcome: Progressing   Problem: Cardiac: Goal: Ability to achieve and maintain adequate cardiopulmonary perfusion will improve Outcome: Progressing   Problem: Health Behavior/Discharge Planning: Goal: Ability to safely manage health-related needs after discharge will improve Outcome: Progressing    Problem: Education: Goal: Required Educational Video(s) Outcome: Progressing   Problem: Clinical Measurements: Goal: Ability to maintain clinical measurements within normal limits will improve Outcome: Progressing Goal: Postoperative complications will be avoided or minimized Outcome: Progressing   Problem: Skin Integrity: Goal: Demonstration of wound healing without infection will improve Outcome: Progressing

## 2021-03-30 NOTE — Progress Notes (Signed)
Progress Note  32 Days Post-Op  Subjective: Patient reports he feels pretty well this AM. He didn't really get to eat soft diet yesterday. Vomited small volume once but denies nausea. Having ostomy output. Denies chest pain this AM and reports dysuria seems maybe a little better.  Objective: Vital signs in last 24 hours: Temp:  [98.1 F (36.7 C)-99.1 F (37.3 C)] 98.4 F (36.9 C) (10/12 0835) Pulse Rate:  [95-104] 104 (10/12 0441) Resp:  [17-27] 21 (10/12 0441) BP: (109-140)/(44-61) 109/46 (10/12 0835) SpO2:  [91 %-98 %] 91 % (10/12 0441) Last BM Date: 03/29/21  Intake/Output from previous day: 10/11 0701 - 10/12 0700 In: 1885.1 [P.O.:437; I.V.:1448.1] Out: 2800 [Urine:2400; Emesis/NG output:150; Stool:250] Intake/Output this shift: No intake/output data recorded.  PE: Gen:  Alert, NAD, pleasant Pulm:  Normal rate and effort.  Abd: Soft, ND, NT, +BS. Stoma appears viable with stool present in ostomy appliance. Midline wound clean with granulation tissue present    Lab Results:  Recent Labs    03/28/21 0504  WBC 7.1  HGB 7.9*  HCT 25.2*  PLT 382   BMET Recent Labs    03/28/21 0504 03/29/21 0146  NA 133* 132*  K 4.5 4.2  CL 102 100  CO2 25 25  GLUCOSE 157* 126*  BUN 29* 28*  CREATININE 1.07 0.99  CALCIUM 8.1* 8.3*   PT/INR No results for input(s): LABPROT, INR in the last 72 hours. CMP     Component Value Date/Time   NA 132 (L) 03/29/2021 0146   NA 134 09/30/2019 0959   NA 142 03/03/2016 0921   K 4.2 03/29/2021 0146   K 4.3 03/03/2016 0921   CL 100 03/29/2021 0146   CO2 25 03/29/2021 0146   CO2 25 03/03/2016 0921   GLUCOSE 126 (H) 03/29/2021 0146   GLUCOSE 89 03/03/2016 0921   BUN 28 (H) 03/29/2021 0146   BUN 11 09/30/2019 0959   BUN 13.6 03/03/2016 0921   CREATININE 0.99 03/29/2021 0146   CREATININE 1.2 03/03/2016 0921   CALCIUM 8.3 (L) 03/29/2021 0146   CALCIUM 9.6 03/03/2016 0921   PROT 5.7 (L) 03/28/2021 0504   PROT 7.3 03/03/2016  0921   ALBUMIN 1.7 (L) 03/28/2021 0504   ALBUMIN 3.8 03/03/2016 0921   AST 24 03/28/2021 0504   AST 36 (H) 03/03/2016 0921   ALT 39 03/28/2021 0504   ALT 31 03/03/2016 0921   ALKPHOS 77 03/28/2021 0504   ALKPHOS 116 03/03/2016 0921   BILITOT 0.6 03/28/2021 0504   BILITOT 0.71 03/03/2016 0921   GFRNONAA >60 03/29/2021 0146   GFRNONAA 67 07/25/2013 1712   GFRAA >60 10/03/2019 0930   GFRAA 77 07/25/2013 1712   Lipase     Component Value Date/Time   LIPASE 22 02/19/2021 1653       Studies/Results: DG CHEST PORT 1 VIEW  Result Date: 03/29/2021 CLINICAL DATA:  Chest pain EXAM: PORTABLE CHEST 1 VIEW COMPARISON:  03/25/2021 FINDINGS: Right upper extremity PICC tip overlies the superior cavoatrial junction. Unchanged cardiomediastinal silhouette. There are faint patchy bilateral airspace opacities. There is no large pleural effusion or visible pneumothorax. No acute osseous abnormality. IMPRESSION: Faint patchy bilateral airspace opacities, concerning for developing of multifocal infectious/inflammatory process. Recommend radiographic follow-up. Electronically Signed   By: Maurine Simmering M.D.   On: 03/29/2021 13:55    Anti-infectives: Anti-infectives (From admission, onward)    Start     Dose/Rate Route Frequency Ordered Stop   03/09/21 0800  piperacillin-tazobactam (ZOSYN) IVPB  3.375 g        3.375 g 12.5 mL/hr over 240 Minutes Intravenous Every 8 hours 03/09/21 0706 03/25/21 2034   02/26/21 0100  doxycycline (VIBRAMYCIN) 100 mg in sodium chloride 0.9 % 250 mL IVPB        100 mg 125 mL/hr over 120 Minutes Intravenous 2 times daily 02/26/21 0005 03/08/21 2359   02/20/21 0300  piperacillin-tazobactam (ZOSYN) IVPB 3.375 g        3.375 g 12.5 mL/hr over 240 Minutes Intravenous Every 8 hours 02/19/21 1907 03/08/21 2359   02/19/21 1900  ceFEPIme (MAXIPIME) 2 g in sodium chloride 0.9 % 100 mL IVPB  Status:  Discontinued        2 g 200 mL/hr over 30 Minutes Intravenous  Once 02/19/21 1857  02/19/21 1906   02/19/21 1900  metroNIDAZOLE (FLAGYL) IVPB 500 mg  Status:  Discontinued        500 mg 100 mL/hr over 60 Minutes Intravenous  Once 02/19/21 1857 02/19/21 1906   02/19/21 1815  piperacillin-tazobactam (ZOSYN) IVPB 3.375 g        3.375 g 100 mL/hr over 30 Minutes Intravenous  Once 02/19/21 1809 02/19/21 1944        Assessment/Plan POD32 s/p ex lap, sigmoid colectomy, end colostomy with enterorrhaphy and drainage of intraabdominal abscess - Dr. Kieth Brightly and Dr. Radene Knee 9/10 for Acute sigmoid diverticulitis with contained perforation and abscess - CT 9/16 demonstrates heterogenous left iliopsoas collection likely hematoma, stranding in the retroperitoneum and subcu tissue.  Distal colon appears decompressed and very small caliber but there is contrast entering this area; there is additionally a similarly small-caliber segment of colon in the proximal transverse and the ascending colon is not dilated- suspect over all this represents small bowel/gastric ileus.  IR does not recommend draining the hematoma at this point. ID following and recommending Zosyn through 10/17  - NGT replaced 9/27  - CT 9/27 with continued complex fluid collection along left psoas, SBO with transition in mid jejunum, post-surgical changes of colon resection with colostomy - Was started on reglan last weekend. Discussed with him the risk of tardive dyskinesia/EPS on 9/20. Patient stated understanding and is okay with continuing Reglan.  - Gastrograffin study 9/28 showed Stable gaseous distention of the small bowel, oral contrast progressed into the colon - CT 10/4 with ileus although improved distention of small bowel, stable psoas collection - vomited once yesterday small volume and continues to have good stool output - continue 1/2 rate TPN and soft diet today; If tolerating well then likely stop TPN tomorrow and initiate calorie count to make sure patient is taking in enough - WOCN following for new ostomy  teaching, daily WTD to midline wound  - Encouraged ambulation (working with therapies), use IS, multimodal pain control  - Cont abx per ID recs   FEN - soft diet, ensure, 1/2 rate TPN VTE - SCDs, therapeutic lovenox  ID - Zosyn 9/4 - 10/17 (per ID) Foley - out, voiding. complained of some dysuria earlier this week - urine Cx with VRE, consider reconsulting ID  LOS: 38 days    Norm Parcel, [facility] 03/30/2021, 9:23 AM Please see Amion for pager number during day hours 7:00am-4:30pm

## 2021-03-30 NOTE — Progress Notes (Signed)
Physical Therapy Treatment Patient Details Name: Travis Conrad MRN: [id number] DOB: [birth year] Today's Date: 03/30/2021   History of Present Illness 70 yo admitted 9/3 with abdominal pain, vomiting and ileus. 9/4 Afib with RVR. 9/8 intrabdominal abscess s/p IR drain placed. 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 Pt found to have lt iliopsoas hematoma on CT 9/16.  9/20 left knee aspiration. 9/26 NG tube removed. 9/27 NGT replaced due to vomiting. 10/1 atelectasis. Pt with Afib and elevated HR.  Work up neg so far and MD cleared pt for therapy on 10/11.  PMhx: HTN, HLD, Afib, ETOH use    PT Comments    Patient received in bed today, pleasant and motivated- however once we got to EOB, session was very much limited by malaise and dizziness. Able to perform a couple of stands but unable to try gait today. BP in sitting 140/70, standing 128/51, unable to tolerate standing x3 minutes. Able to scoot along EOB with min guard. Left in bed in chair position with all needs met, alarm active. Noted more tremors in [name] today as well as slight confusion- will discuss with attending via Epic secure chat.    Recommendations for follow up therapy are one component of a multi-disciplinary discharge planning process, led by the attending physician.  Recommendations may be updated based on patient status, additional functional criteria and insurance authorization.  Follow Up Recommendations  CIR;Supervision/Assistance - 24 hour     Equipment Recommendations  3in1 (PT)    Recommendations for Other Services       Precautions / Restrictions Precautions Precautions: Fall Precaution Comments: abdominal incision, wound VAC out 10/10, watch HR and BP Restrictions Weight Bearing Restrictions: No     Mobility  Bed Mobility Overal bed mobility: Needs Assistance Bed Mobility: Rolling;Sidelying to Sit;Sit to Sidelying Rolling: Min guard Sidelying to  sit: Min assist     Sit to sidelying: Mod assist General bed mobility comments: MinA for boosting up to midline sitting, continues to require ModA for BLE management with back to bed    Transfers Overall transfer level: Needs assistance Equipment used: Rolling walker (2 wheeled) Transfers: Sit to/from Stand Sit to Stand: Mod assist         General transfer comment: ModA to boost up to standing from standard height and elevated bed today  Ambulation/Gait             General Gait Details: deferred- malaise and dizziness   Stairs             Wheelchair Mobility    Modified Rankin (Stroke Patients Only)       Balance Overall balance assessment: Needs assistance Sitting-balance support: Bilateral upper extremity supported Sitting balance-Leahy Scale: Fair Sitting balance - Comments: reliant on BUE support   Standing balance support: Bilateral upper extremity supported;During functional activity Standing balance-Leahy Scale: Poor Standing balance comment: reliant on RW in standing                            Cognition Arousal/Alertness: Awake/alert Behavior During Therapy: WFL for tasks assessed/performed Overall Cognitive Status: Impaired/Different from baseline Area of Impairment: Safety/judgement;Awareness;Problem solving                   Current Attention Level: Selective Memory: Decreased short-term memory Following Commands: Follows one step commands consistently;Follows one step commands with increased time Safety/Judgement: Decreased awareness of safety;Decreased awareness of deficits Awareness:  Intellectual Problem Solving: Slow processing;Difficulty sequencing General Comments: needed more cues than typical and STM deficits more pronouned today- also seemed to get confused when talking about temperature ("If I'm 98.6.Marland KitchenMarland Kitchenand I touch something 100 degrees....would I feel hot?") today, as well as other abstract ideas. Much more  fatigued than usual      Exercises      General Comments General comments (skin integrity, edema, etc.): BP sitting at EOB 140/70, standing 122/51, unable to tolerate standing x3 minutes      Pertinent Vitals/Pain Pain Assessment: Faces Faces Pain Scale: Hurts little more Pain Location: abdomen Pain Descriptors / Indicators: Discomfort;Grimacing Pain Intervention(s): Limited activity within patient's tolerance;Monitored during session    Home Living                      Prior Function            PT Goals (current goals can now be found in the care plan section) Acute Rehab PT Goals Patient Stated Goal: to get back to baseline PT Goal Formulation: With patient/family Time For Goal Achievement: 04/11/21 Potential to Achieve Goals: Good Progress towards PT goals: Not progressing toward goals - comment (limited by malaise/dizziness today)    Frequency    Min 4X/week      PT Plan Current plan remains appropriate    Co-evaluation              AM-PAC PT "6 Clicks" Mobility   Outcome Measure  Help needed turning from your back to your side while in a flat bed without using bedrails?: A Little Help needed moving from lying on your back to sitting on the side of a flat bed without using bedrails?: A Little Help needed moving to and from a bed to a chair (including a wheelchair)?: A Lot Help needed standing up from a chair using your arms (e.g., wheelchair or bedside chair)?: A Lot Help needed to walk in hospital room?: A Lot Help needed climbing 3-5 steps with a railing? : A Lot 6 Click Score: 14    End of Session Equipment Utilized During Treatment: Gait belt Activity Tolerance: Treatment limited secondary to medical complications (Comment) (malaise, dizziness) Patient left: in bed;with call bell/phone within reach;with bed alarm set (bed in chair position, refuses up to recliner) Nurse Communication: Mobility status PT Visit Diagnosis: Other  abnormalities of gait and mobility (R26.89);Difficulty in walking, not elsewhere classified (R26.2)     Time: 4970-2637 PT Time Calculation (min) (ACUTE ONLY): 25 min  Charges:  $Therapeutic Activity: 23-37 mins                    Windell Norfolk, DPT, PN2   Supplemental Physical Therapist [place]    Pager [phone] Acute Rehab Office [phone]

## 2021-03-30 NOTE — PMR Pre-admission (Signed)
PMR Admission Coordinator Pre-Admission Assessment  Patient: Travis Conrad is an 69 y.o., male MRN: [id number] DOB: 07/28/1950 Height: 6' (182.9 cm) Weight: 93.4 kg  Insurance Information HMO: yes    PPO:      PCP:      IPA:      80/20:      OTHER:  PRIMARY: Blue Medicare      Policy#: [id number]      Subscriber: pt CM Name: Shannon      Phone#: [phone]     Fax#: [phone] Pre-Cert#: TBD   f/u in 7 days   Employer:  Benefits:  Phone #: [phone]     Name: 10/12 Eff. Date: 06/19/2020     Deduct: none      Out of Pocket Max: $3900      Life Max: none CIR: $335 co pay per day days 1 until 6      SNF: no copay days 1 until 20; $188 co pay per day days 21 until 60; no copay days 61 until 100 Outpatient: $40 copay per visit     Co-Pay: visits per medical neccesity Home Health: 100%      Co-Pay: visits per medical neccesity DME: 80%     Co-Pay: 20% Providers: in network  SECONDARY: none  Financial Counselor:       Phone#:   The "Data Collection Information Summary" for patients in Inpatient Rehabilitation Facilities with attached "Privacy Act Statement-Health Care Records" was provided and verbally reviewed with: Patient and Family  Emergency Contact Information Contact Information     Name Relation Home Work Mobile   Stengel,Jimmi Spouse   [phone]      Current Medical History  Patient Admitting Diagnosis: Debility after sepsis due to perforated diverticulitis  History of Present Illness:  69-year-old right-handed male with history of PAF status post ablation 10/03/2019 followed by Dr. Camnitz maintained on Eliquis, hyperlipidemia, hypertension, tobacco/alcohol use, benign essential tremors, prior diverticulitis.  Presented 02/20/2021 with increasing abdominal pain nausea and vomiting times several days as well as decrease in appetite and decreased urination.  On presentation to the ED he was mildly hypotensive with systolic blood pressure in the 70s.  Noted WBC of  21,000 as well as AKI with creatinine 3.2, lipase 22, lactic acid 1.3.  CT of abdomen pelvis showed inflammation of the sigmoid colon and a small amount of extraluminal gas, consistent with perforated diverticulitis and abscess.  He was started on Zosyn in the ED as well as IV fluids and low-dose Levophed.  A nasogastric tube for gastric decompression.  Close monitoring of blood pressure as well as history of PAF requiring intravenous amiodarone with echocardiogram completed 02/23/2021 showing ejection fraction of 60 to 65%.  Patient was seen in consultation by interventional radiology 02/24/2021 for aspiration and drain placement of intra-abdominal fluid collection per Dr.Jon Mugweru and later underwent exploratory laparotomy, sigmoid colectomy with end colostomy creation drainage of intra-abdominal abscess with wound VAC placement 02/26/2021 per surgery Dr.  Kinsinger.  Hospital course complicated by bouts of delirium developed postoperative anemia required 1 unit packed red blood cells with latest hemoglobin of 7 awaiting plan for transfusion.  Patient was cleared to resume Eliquis as prior to admission for PAF.  Urology consulted 03/02/2021 due to right scrotal pain with scrotal ultrasound demonstrating findings consistent with right epididymitis and a reactive complex hydrocele.  Developed VRE UTI placed on Zyvox through 04/04/2021 and maintained on contact precautions.   Developed left knee pain findings of positive effusion underwent   left knee aspiration  injection with good toleration, fluid showed no crystals, gram stain with no organisms.  In regards to patient's AKI initially maintained on TPN renal function much improved latest creatinine 0.8 to monitoring of sodium 128.  Patient is currently tolerating a mechanical soft diet but with poor po intake, monitoring with calorie count.  Patient's medical record from [facility] has been reviewed by the rehabilitation admission coordinator and  physician.  Past Medical History  Past Medical History:  Diagnosis Date   Atrial fibrillation (HCC)    Dysrhythmia    a-fib   High cholesterol    "RX made groin hurt" (08/14/2016)   Hypertension    Has the patient had major surgery during 100 days prior to admission? Yes  Family History   family history includes Other in his brother.  Current Medications  Current Facility-Administered Medications:    (feeding supplement) PROSource Plus liquid 30 mL, 30 mL, Oral, TID BM, Elgergawy, Dawood S, MD, 30 mL at 04/06/21 0843   0.9 %  sodium chloride infusion, , Intravenous, PRN, Kinsinger, Luke Aaron, MD, Stopped at 03/24/21 2259   acetaminophen (TYLENOL) tablet 650 mg, 650 mg, Oral, Q6H, Johnson, Kelly R, PA-C, 650 mg at 04/06/21 0449   amiodarone (PACERONE) tablet 200 mg, 200 mg, Oral, Daily, Elgergawy, Dawood S, MD, 200 mg at 04/06/21 0845   apixaban (ELIQUIS) tablet 5 mg, 5 mg, Oral, BID, Hammons, Kimberly B, RPH, 5 mg at 04/06/21 0843   benzocaine (ORAJEL) 10 % mucosal gel, , Mouth/Throat, Q6H PRN, Elgergawy, Dawood S, MD, Given at 03/29/21 1120   Chlorhexidine Gluconate Cloth 2 % PADS 6 each, 6 each, Topical, Daily, Kinsinger, Luke Aaron, MD, 6 each at 04/06/21 1012   diltiazem (CARDIZEM) tablet 60 mg, 60 mg, Oral, Q6H, Hammons, Kimberly B, RPH, 60 mg at 04/06/21 0452   docusate sodium (COLACE) capsule 100 mg, 100 mg, Oral, BID, Johnson, Kelly R, PA-C, 100 mg at 04/06/21 0845   dronabinol (MARINOL) capsule 5 mg, 5 mg, Oral, BID AC, Johnson, Kelly R, PA-C, 5 mg at 04/05/21 1737   feeding supplement (ENSURE ENLIVE / ENSURE PLUS) liquid 237 mL, 237 mL, Oral, TID BM, Elgergawy, Dawood S, MD, 237 mL at 04/06/21 0846   folic acid (FOLVITE) tablet 1 mg, 1 mg, Oral, Daily, Robertson, Crystal S, RPH, 1 mg at 04/06/21 0845   furosemide (LASIX) tablet 20 mg, 20 mg, Oral, Daily, Elgergawy, Dawood S, MD, 20 mg at 04/06/21 0845   hydrALAZINE (APRESOLINE) tablet 25 mg, 25 mg, Oral, Q6H PRN, Lama,  Gagan S, MD   hydrocortisone cream 1 %, , Topical, BID, Lama, Gagan S, MD, Given at 04/05/21 2034   insulin aspart (novoLOG) injection 0-9 Units, 0-9 Units, Subcutaneous, TID WC, Chen, Lydia D, RPH, 1 Units at 04/03/21 1222   ipratropium-albuterol (DUONEB) 0.5-2.5 (3) MG/3ML nebulizer solution 3 mL, 3 mL, Nebulization, Q4H PRN, Pahwani, Ravi, MD, 3 mL at 04/05/21 1416   lidocaine (LIDODERM) 5 % 1 patch, 1 patch, Transdermal, Q24H, Vann, Jessica U, DO, 1 patch at 04/06/21 0950   LORazepam (ATIVAN) tablet 1 mg, 1 mg, Oral, Q6H PRN, Chotiner, Bradley S, MD, 1 mg at 04/05/21 1153   methocarbamol (ROBAXIN) tablet 1,000 mg, 1,000 mg, Oral, QID, Johnson, Kelly R, PA-C, 1,000 mg at 04/06/21 0844   morphine 2 MG/ML injection 1 mg, 1 mg, Intravenous, Q4H PRN, Johnson, Kelly R, PA-C, 1 mg at 04/06/21 0853   [START ON 04/07/2021] multivitamin with minerals tablet 1 tablet,   1 tablet, Oral, Daily, Reome, Earle J, RPH   Muscle Rub CREA, , Topical, PRN, Elgergawy, Dawood S, MD, Given at 04/05/21 2030   nicotine (NICODERM CQ - dosed in mg/24 hours) patch 21 mg, 21 mg, Transdermal, Daily, Kinsinger, Luke Aaron, MD, 21 mg at 04/06/21 0844   nitroGLYCERIN (NITROSTAT) SL tablet 0.4 mg, 0.4 mg, Sublingual, Q5 min PRN, Elgergawy, Dawood S, MD   [DISCONTINUED] ondansetron (ZOFRAN) tablet 4 mg, 4 mg, Oral, Q6H PRN **OR** ondansetron (ZOFRAN) injection 4 mg, 4 mg, Intravenous, Q6H PRN, Kinsinger, Luke Aaron, MD, 4 mg at 04/05/21 0408   oxyCODONE (Oxy IR/ROXICODONE) immediate release tablet 5-10 mg, 5-10 mg, Oral, Q6H PRN, Johnson, Kelly R, PA-C, 10 mg at 04/05/21 0427   pantoprazole (PROTONIX) EC tablet 40 mg, 40 mg, Oral, BID, Hammons, Kimberly B, RPH, 40 mg at 04/06/21 0844   sodium chloride flush (NS) 0.9 % injection 10 mL, 10 mL, Intracatheter, Q12H, Mugweru, Jon, MD, 10 mL at 04/06/21 0953   thiamine tablet 100 mg, 100 mg, Oral, Daily, Robertson, Crystal S, RPH, 100 mg at 04/06/21 0845   TPN CYCLIC-ADULT (ION), ,  Intravenous, Cyclic-See Admin Instructions, Reome, Earle J, RPH   zolpidem (AMBIEN) tablet 5 mg, 5 mg, Oral, QHS PRN, Elgergawy, Dawood S, MD, 5 mg at 04/05/21 2313  Patients Current Diet:  Diet Order             DIET SOFT Room service appropriate? No; Fluid consistency: Thin  Diet effective now                  Precautions / Restrictions Precautions Precautions: Fall Precaution Comments: abdominal incision, watch HR and BP Restrictions Weight Bearing Restrictions: No   Has the patient had 2 or more falls or a fall with injury in the past year? No  Prior Activity Level Community (5-7x/wk): Independent and driving  Prior Functional Level Self Care: Did the patient need help bathing, dressing, using the toilet or eating? Independent  Indoor Mobility: Did the patient need assistance with walking from room to room (with or without device)? Independent  Stairs: Did the patient need assistance with internal or external stairs (with or without device)? Independent  Functional Cognition: Did the patient need help planning regular tasks such as shopping or remembering to take medications? Independent  Patient Information Are you of Hispanic, Latino/a,or Spanish origin?: A. No, not of Hispanic, Latino/a, or Spanish origin What is your race?: A. White Do you need or want an interpreter to communicate with a doctor or health care staff?: 0. No  Patient's Response To:  Health Literacy and Transportation Is the patient able to respond to health literacy and transportation needs?: Yes Health Literacy - How often do you need to have someone help you when you read instructions, pamphlets, or other written material from your doctor or pharmacy?: Never In the past 12 months, has lack of transportation kept you from medical appointments or from getting medications?: No In the past 12 months, has lack of transportation kept you from meetings, work, or from getting things needed for daily  living?: No  Home Assistive Devices / Equipment Home Assistive Devices/Equipment: Cane (specify quad or straight) (doesn't use cane at home) Home Equipment: Walker - 2 wheels, Cane - single point, Transport chair, Shower seat - built in, Bedside commode  Prior Device Use: Indicate devices/aids used by the patient prior to current illness, exacerbation or injury? None of the above  Current Functional Level Cognition  Overall Cognitive Status:   Impaired/Different from baseline Current Attention Level: Selective Orientation Level: Oriented X4 Following Commands: Follows one step commands consistently Safety/Judgement: Decreased awareness of deficits, Decreased awareness of safety General Comments: requires encouragement to stay out of bed    Extremity Assessment (includes Sensation/Coordination)  Upper Extremity Assessment: Defer to OT evaluation LUE Deficits / Details: Pt with slight tremor with movement. LUE Sensation: WNL LUE Coordination: decreased fine motor  Lower Extremity Assessment: Defer to PT evaluation    ADLs  Overall ADL's : Needs assistance/impaired Eating/Feeding: Minimal assistance, Sitting Eating/Feeding Details (indicate cue type and reason): Pt states his tremor keeps him from feeding self and he likes wife to feed him. Pt able to drink from cup with LUE without issue. Pt should be able to feed self. Encouraged wife and pt to allow for his independence. Will provide adaptive equipment if needed but do not feel it is needed a this time. Grooming: Wash/dry face, Wash/dry hands, Brushing hair, Standing, Min guard Grooming Details (indicate cue type and reason): performed standing at sink Upper Body Bathing: Set up, Supervision/ safety, Sitting Upper Body Bathing Details (indicate cue type and reason): performed sitting on eob Upper Body Dressing : Minimal assistance, Bed level Upper Body Dressing Details (indicate cue type and reason): not assessed, however, will infer  min guard due to pain limited engagement. Lower Body Dressing: Moderate assistance, Cueing for compensatory techniques, Sit to/from stand Lower Body Dressing Details (indicate cue type and reason): Pt sat figure 4 today with only min assist to cross [name] and donned and doffed socks.  Pt stood with min assist to manage LE clothing. Toilet Transfer: Min guard, Stand-pivot Toilet Transfer Details (indicate cue type and reason): stand pivot transfer from EOB to recliner;pt with ostomy and condom catheter Toileting- Clothing Manipulation and Hygiene: Moderate assistance Toileting - Clothing Manipulation Details (indicate cue type and reason): modA to wash bottom while standing.  Pt has difficulty letting go of walker to clean self in standing. Functional mobility during ADLs: Minimal assistance, Rolling walker General ADL Comments: required seated rest break following standing at sink for groomng    Mobility  Overal bed mobility: Modified Independent Bed Mobility: Sit to Supine, Supine to Sit Rolling: Min guard Sidelying to sit: Min guard, HOB elevated Supine to sit: Min guard Sit to supine: Min guard Sit to sidelying: Mod assist General bed mobility comments: able to get to eob with verbal cues to intiate    Transfers  Overall transfer level: Needs assistance Equipment used: Rolling walker (2 wheeled) Transfers: Sit to/from Stand Sit to Stand: Min guard Stand pivot transfers: Min guard General transfer comment: used rw for transfers    Ambulation / Gait / Stairs / Wheelchair Mobility  Ambulation/Gait Ambulation/Gait assistance: Min guard Gait Distance (Feet): 110 Feet Assistive device: Rolling walker (2 wheeled) Gait Pattern/deviations: Step-through pattern, Trunk flexed General Gait Details: cue for proximity to RW and encouragement for increased distance Gait velocity: Decreased Gait velocity interpretation: <1.31 ft/sec, indicative of household ambulator    Posture / Balance  Dynamic Sitting Balance Sitting balance - Comments: able to sit EOB without support Balance Overall balance assessment: Needs assistance Sitting-balance support: No upper extremity supported Sitting balance-Leahy Scale: Good Sitting balance - Comments: able to sit EOB without support Standing balance support: Single extremity supported, During functional activity Standing balance-Leahy Scale: Poor Standing balance comment: able to stand at sink for grooming    Special needs/care consideration New colostomy this admission Cyclic TNA Calorie Count               Show:Clear all [x]Written[]Templated[x]Copied  Added by: [x]Smith, Cathlean Marseilles, RN  []Hover for details                      [place] Nurse wound follow up Patient receiving care in [facility] [address], PA-C present to evaluate wound. NPWT discontinued today.  Wound type: Surgical midline incision Wound bed: Clean, pink granulation tissue Measurements: 10.2 cm x 3.5 cm x 0.8 cm Drainage (amount, consistency, odor) Sanguinous in canister Periwound: intact Dressing procedure/placement/frequency: One piece of black foam dressing removed.  Placed a saline moistened 4 x 4 into the wound and covered with dry 4 x 4 and ABD pad. Change daily   WOC Nurse ostomy follow up Stoma type/location: LUQ colostomy Stomal assessment/size: 1 1/4" oval red budded just above the level of the skin. Sutures in place.  Peristomal assessment: intact Treatment options for stomal/peristomal skin: Barrier ring Output: emptied 125 cc of dark green/black stool, not as sticky as previously. Ostomy pouching: 1pc. convex Education provided: None today. Pouch will be due for change on Thurs or Friday of this week. Allow spouse to empty and change pouch.  Enrolled patient in [place] Start Discharge program: Yes (previously) WOC is signing off but will be available to this patient if needed. Please re-consult.    Cathlean Marseilles Tamala Julian, MSN, RN,  CMSRN, AGCNS, [facility] Wound Treatment Associate Pager [phone]      Previous Home Environment  Living Arrangements: Spouse/significant other  Lives With: Spouse Available Help at Discharge: Family, Available 24 hours/day Type of Home: House Home Layout: One level Home Access: Stairs to enter Entrance Stairs-Rails: Right Entrance Stairs-Number of Steps: 3 Bathroom Shower/Tub: Multimedia programmer: Standard Bathroom Accessibility: Yes How Accessible: Accessible via walker [place]: No Additional Comments: wife reports being avaible 24/7 to assist  Discharge Living Setting Plans for Discharge Living Setting: Patient's home, Lives with (comment) (wife) Type of Home at Discharge: House Discharge Home Layout: One level Discharge Home Access: Stairs to enter Entrance Stairs-Rails: Right Entrance Stairs-Number of Steps: 3 Discharge Bathroom Shower/Tub: Walk-in shower Discharge Bathroom Toilet: Standard Discharge Bathroom Accessibility: Yes How Accessible: Accessible via walker Does the patient have any problems obtaining your medications?: No  Social/Family/Support Systems Patient Roles: Spouse Contact Information: Jimmi, spouse Anticipated Caregiver: wife Anticipated Caregiver's Contact Information: see contacts Ability/Limitations of Caregiver: none Caregiver Availability: 24/7 Discharge Plan Discussed with Primary Caregiver: Yes Is Caregiver In Agreement with Plan?: Yes Does Caregiver/Family have Issues with Lodging/Transportation while Pt is in Rehab?: No  Goals Patient/Family Goal for Rehab: supervision to min assist with PT and OT Expected length of stay: ELOS 10 to 12 days Pt/Family Agrees to Admission and willing to participate: Yes Program Orientation Provided & Reviewed with Pt/Caregiver Including Roles  & Responsibilities: Yes  Decrease burden of Care through IP rehab admission: n/a  Possible need for SNF placement upon discharge: not  anticipated  Patient Condition: I have reviewed medical records from [facility], spoken with [place], and patient and spouse. I met with patient at the bedside for inpatient rehabilitation assessment.  Patient will benefit from ongoing PT and OT, can actively participate in 3 hours of therapy a day 5 days of the week, and can make measurable gains during the admission.  Patient will also benefit from the coordinated team approach during an Inpatient Acute Rehabilitation admission.  The patient will receive intensive therapy as well as Rehabilitation physician, nursing, social worker, and care management interventions.  Due  to bladder management, bowel management, safety, skin/wound care, disease management, medication administration, pain management, and patient education the patient requires 24 hour a day rehabilitation nursing.  The patient is currently min to mod assist overall with mobility and basic ADLs.  Discharge setting and therapy post discharge at home with home health is anticipated.  Patient has agreed to participate in the Acute Inpatient Rehabilitation Program and will admit today.  Preadmission Screen Completed [id number] Boyette RN MSN with updates by Julious Payer, Audelia Acton, 04/06/2021 10:53 AM ______________________________________________________________________   Discussed status with Dr. Ranell Patrick on 04/06/2021 at 1054 and received approval for admission today.  Admission Coordinator:  Cleatrice Burke, RN, time 1829 Date 04/06/2021   Assessment/Plan: Diagnosis: Debility Does the need for close, 24 hr/day Medical supervision in concert with the patient's rehab needs make it unreasonable for this patient to be served in a less intensive setting? Yes Co-Morbidities requiring supervision/potential complications: AKI, VRE UTI, left knee effusion, right epididymitis, reactive complex hydrocele Due to bladder management, bowel management, safety, skin/wound care, disease  management, medication administration, pain management, and patient education, does the patient require 24 hr/day rehab nursing? Yes Does the patient require coordinated care of a physician, rehab nurse, PT, OT, SLP and  to address physical and functional deficits in the context of the above medical diagnosis(es)? Yes Addressing deficits in the following areas: balance, endurance, locomotion, strength, transferring, bowel/bladder control, bathing, dressing, feeding, grooming, toileting, cognition, and psychosocial support Can the patient actively participate in an intensive therapy program of at least 3 hrs of therapy 5 days a week? Yes The potential for patient to make measurable gains while on inpatient rehab is excellent Anticipated functional outcomes upon discharge from inpatient rehab: supervision PT, supervision OT, supervision SLP Estimated rehab length of stay to reach the above functional goals is: 5-7 days Anticipated discharge destination: Home 10. Overall Rehab/Functional Prognosis: excellent   MD Signature: Leeroy Cha, MD

## 2021-03-30 NOTE — Progress Notes (Signed)
PHARMACY - TOTAL PARENTERAL NUTRITION CONSULT NOTE  Indication: Prolonged ileus  Patient Measurements: Height: 6' (182.9 cm) Weight: 93.6 kg (206 lb 5.6 oz) IBW/kg (Calculated) : 77.6 TPN AdjBW (KG): 82.6 Body mass index is 27.99 kg/m.  Assessment:  39 YOM with lower abdominal pain/N/V found to have perforated diverticulitis. CT abd on 9/3 found to have active ileus/pSBO from diverticulitis/bowel inflammation. Pharmacy consulted to manage TPN for prolonged ileus. Of note patient has a history pf alcohol dependency and is at high risk for refeeding.   Glucose / Insulin: no hx DM, A1c 5.9% - CBGs < 180.  Off steroid on 10/5. Used 4 units SSI in past 24 hrs, 20 units insulin in TPN.  Electrolytes: Na 132 (max in TPN), K 4.2 (Lasix IV 40mg  daily), CoCa 9.9, others WNL Renal: SCr 0.99, BUN 28 Hepatic: LFTs / tbili  WNL, TG 151, albumin 1.7  Intake / Output; MIVF: UOP 2.1 ml/kg/hr on Lasix, net -30.6L per charting, Emesis 165ml, colostomy 263ml on metoclopramide 10 IV Q 6 hr GI Imaging:  - 9/3 CT abd: active ileus/pSBO - 9/16 CT abd: L-iliopsoas fluid collection (abscess or hematoma), resolution of prior diverticular abscess, still w/ evidence of ileus - 9/27 CT abd: continued retroperitoneal complex fluid collection, SBO - 10/4 CT: Left iliopsoas enlargement heterogeneity persists > may represent intramuscular hematoma, infection or mass.  Ieus, degree of small-bowel distention has decreased, pSBO cannot be excluded. - 10/7 KUB: Dilated loops of small bowel are slightly more prominent than on the prior study. Gas is noted within the colon. - 10/10 KUB read pending  GI Surgeries / Procedures:  - 9/10 ex-lap, sigmoid colectomy w/ end colostomy creation, enterorrhaphy, drainage of intra-abd abscess, wound vac placement  Central access: PICC line 02/24/21 TPN start date: 02/25/21  Nutritional Goals, RD Estimated Needs Total Energy Estimated Needs: 2000-2200 Total Protein Estimated Needs:  100-110 grams Total Fluid Estimated Needs: >2L  Current Nutrition:  TPN 10/6 CLD started, minimal intake 10/8 Drank 1.5 Ensure  10/9 FLD- ate 2 Ensure, 3 spoonfuls of pudding and sorbet. Wife to bring in liquid foods patient might like better  10/10 per wife ate 1 Ensure, a little cream of chicken soup, a little of a milk shake (Milk+pudding+ice cream that wife made) 10/11 breakfast ate 4 bites of grits and 1 Ensure.     10/12 did not eat much, had emesis   Plan:  Per discussion with surgery, increase concentrated TPN to 55 ml/hr to provide 105 g AA, 185 g CHO and 45 g ILE for a total of 1506 kCal, meeting ~75% of patient's needs Electrolytes in TPN: Adjusted with rate change: Na 15mEq/L, K 50 mEq/L, Ca 3 mEq/L, Mg 15 mEq/L, Phos 15 mmol/L, Cl:Ac 1:2 Add standard MVI, trace elements, folate and thiamine to TPN Change to sensitive SSI TID with meals and 20 units regular insulin in TPN Standard TPN labs on Mon and Thurs  F/U PO intake/diet advancement    Benetta Spar, PharmD, BCPS, [facility] Clinical Pharmacist  Please check AMION for all [place] phone numbers After 10:00 PM, call [place]

## 2021-03-30 NOTE — Progress Notes (Signed)
[facility] Infectious Diseases Follow Up Note  Patient Identification: Patient Name: Travis Conrad MRN: [id number] [place] Date: 02/19/2021  5:38 PM Age: 70 y.o.Today's Date: 03/30/2021   Reason for Visit: VRE in urine cultures   Principal Problem:   Diverticulitis of large intestine with perforation and abscess without bleeding Active Problems:   Diverticulitis of intestine with abscess   Atrial fibrillation, chronic (HCC)   Essential hypertension   Diverticulitis   AKI (acute kidney injury) ([place])   Severe sepsis with acute organ dysfunction (HCC)   Intra-abdominal abscess ([place])   Epididymitis   Antibiotics: Pip/tazo 9/3-10/7                     Doxycyline 9/9-9/20  Lines/Hardwares: PICC RT arm , LUQ colostomy , wound vac   Assessment VRE Cystitis - dysuria+. No fever/renal angle tenderness   Complicated diverticulitis with perforation/Intraabdominal abscess s/p IR drain placement 9/8 ( Cx bacteroides fragilis/bacteroides thetaiotaomicron) and s/p ex lap, sigmoid colectomy with end colostomy, washout, enterorrhaphy 9/10- Repeat CT abd/pelvis 10/4 with improved findings, s/p completion of IV  abtx on 10/7.  Post operative Ileus on TPN - General surgery following  4.   RT epididymitis s/p tx   Recommendations Linezolid 600mg  PO BID for 5 days for acute cystitis 2.   Discussed with Primary 3.   ID will sign off.   Rest of the management as per the primary team. Thank you for the consult. Please page with pertinent questions or concerns.  ______________________________________________________________________ Subjective patient seen and examined at the bedside. Wife at bedside. He denies fevers , chills. He is on soft diet and has been tolerating OK. Denies abdominal pain, occasionally has abdominal cramps. Denies any back pain. He has urinary burning for last few days for which UA/urine cx was done.   Vitals BP 133/89  (BP Location: Left Arm)   Pulse (!) 104   Temp 98.4 F (36.9 C) (Oral)   Resp (!) 21   Ht 6' (1.829 m)   Wt 93.6 kg   SpO2 91%   BMI 27.99 kg/m     Physical Exam Constitutional:  Not in acute distress, sitting in bed comfortably     Comments:   Cardiovascular:     Rate and Rhythm: Normal rate and regular rhythm.     Heart sounds:   Pulmonary:     Effort: Pulmonary effort is normal.     Comments: bilateral equal air entry   Abdominal:     Palpations: Abdomen is soft.     Tenderness: Non tender and non distended, colostomy with liquid stool, midline abdominal bandage   Musculoskeletal:        General: No swelling or tenderness.   Skin:    Comments: No lesions or rashes   Neurological:     General: No focal deficit present. Awake, alert and oriented   Psychiatric:        Mood and Affect: Mood normal. Calm and cooperative   Pertinent Microbiology Results for orders placed or performed during the hospital encounter of 02/19/21  Culture, blood (routine x 2)     Status: None   Collection Time: 02/19/21  6:07 PM   Specimen: BLOOD  Result Value Ref Range Status   Specimen Description   Final    BLOOD BLOOD LEFT FOREARM Performed at [facility], [address] [place], [place] [address]    Special Requests   Final    BOTTLES DRAWN AEROBIC AND ANAEROBIC Blood  Culture adequate volume Performed at [facility], [address], [place], [place] [address]    Culture   Final    NO GROWTH 5 DAYS Performed at [facility], [place] [address], [place], [place] [address]    Report Status 02/24/2021 FINAL  Final  Culture, blood (routine x 2)     Status: None   Collection Time: 02/19/21  6:12 PM   Specimen: BLOOD  Result Value Ref Range Status   Specimen Description   Final    BLOOD BLOOD RIGHT FOREARM Performed at [facility], [address] [place], [place] [address]    Special Requests   Final     BOTTLES DRAWN AEROBIC AND ANAEROBIC Blood Culture adequate volume Performed at [facility], [address], [place], [place] [address]    Culture   Final    NO GROWTH 5 DAYS Performed at [facility], [place] [address], [place], [place] [address]    Report Status 02/24/2021 FINAL  Final  Resp Panel by RT-PCR (Flu A&B, Covid)     Status: None   Collection Time: 02/19/21  6:38 PM   Specimen: Nasopharyngeal(NP) swabs in vial transport medium  Result Value Ref Range Status   SARS Coronavirus 2 by RT PCR NEGATIVE NEGATIVE Final    Comment: (NOTE) SARS-CoV-2 target nucleic acids are NOT DETECTED.  The SARS-CoV-2 RNA is generally detectable in upper respiratory specimens during the acute phase of infection. The lowest concentration of SARS-CoV-2 viral copies this assay can detect is 138 copies/mL. A negative result does not preclude SARS-Cov-2 infection and should not be used as the sole basis for treatment or other patient management decisions. A negative result may occur with  improper specimen collection/handling, submission of specimen other than nasopharyngeal swab, presence of viral mutation(s) within the areas targeted by this assay, and inadequate number of viral copies(<138 copies/mL). A negative result must be combined with clinical observations, patient history, and epidemiological information. The expected result is Negative.  Fact Sheet for Patients:  EntrepreneurPulse.com.au  Fact Sheet for Healthcare Providers:  IncredibleEmployment.be  This test is no t yet approved or cleared by the [place] FDA and  has been authorized for detection and/or diagnosis of SARS-CoV-2 by FDA under an Emergency Use Authorization (EUA). This EUA will remain  in effect (meaning this test can be used) for the duration of the COVID-19 declaration under Section 564(b)(1) of the Act, 21 U.S.C.section 360bbb-3(b)(1), unless the  authorization is terminated  or revoked sooner.       Influenza A by PCR NEGATIVE NEGATIVE Final   Influenza B by PCR NEGATIVE NEGATIVE Final    Comment: (NOTE) The Xpert Xpress SARS-CoV-2/FLU/RSV plus assay is intended as an aid in the diagnosis of influenza from Nasopharyngeal swab specimens and should not be used as a sole basis for treatment. Nasal washings and aspirates are unacceptable for Xpert Xpress SARS-CoV-2/FLU/RSV testing.  Fact Sheet for Patients: EntrepreneurPulse.com.au  Fact Sheet for Healthcare Providers: IncredibleEmployment.be  This test is not yet approved or cleared by the [place] FDA and has been authorized for detection and/or diagnosis of SARS-CoV-2 by FDA under an Emergency Use Authorization (EUA). This EUA will remain in effect (meaning this test can be used) for the duration of the COVID-19 declaration under Section 564(b)(1) of the Act, 21 U.S.C. section 360bbb-3(b)(1), unless the authorization is terminated or revoked.  Performed at [facility], [address], [place], [place] [address]   Aerobic/Anaerobic Culture  w Gram Stain (surgical/deep wound)     Status: None   Collection Time: 02/24/21  3:14 PM   Specimen: Abdomen; Abscess  Result Value Ref Range Status   Specimen Description ABDOMEN  Final   Special Requests NONE  Final   Gram Stain   Final    FEW SQUAMOUS EPITHELIAL CELLS PRESENT ABUNDANT WBC PRESENT,BOTH PMN AND MONONUCLEAR FEW GRAM POSITIVE COCCI FEW GRAM POSITIVE RODS    Culture   Final    MODERATE BACTEROIDES FRAGILIS MODERATE BACTEROIDES THETAIOTAOMICRON BETA LACTAMASE POSITIVE Performed at [facility], [place] [address] [place], [place] [address]    Report Status 02/28/2021 FINAL  Final  Body fluid culture w Gram Stain     Status: None   Collection Time: 03/08/21  9:27 AM   Specimen: Synovium; Body Fluid  Result Value Ref Range Status   Specimen  Description SYNOVIAL FLUID  Final   Special Requests LEFT KNEE  Final   Gram Stain   Final    WBC PRESENT,BOTH PMN AND MONONUCLEAR NO ORGANISMS SEEN CYTOSPIN SMEAR    Culture   Final    NO GROWTH Performed at [facility], [address], [place], [place] [address]    Report Status 03/11/2021 FINAL  Final  Urine Culture     Status: Abnormal   Collection Time: 03/28/21  8:11 AM   Specimen: Urine, Clean Catch  Result Value Ref Range Status   Specimen Description URINE, CLEAN CATCH  Final   Special Requests   Final    NONE Performed at [facility], [place] [address] [place], [place] [address]    Culture (A)  Final    >=100,000 COLONIES/mL VANCOMYCIN RESISTANT ENTEROCOCCUS   Report Status 03/30/2021 FINAL  Final   Organism ID, Bacteria VANCOMYCIN RESISTANT ENTEROCOCCUS (A)  Final      Susceptibility   Vancomycin resistant enterococcus - MIC*    AMPICILLIN >=32 RESISTANT Resistant     NITROFURANTOIN 32 SENSITIVE Sensitive     VANCOMYCIN >=32 RESISTANT Resistant     LINEZOLID 2 SENSITIVE Sensitive     * >=100,000 COLONIES/mL VANCOMYCIN RESISTANT ENTEROCOCCUS   Pertinent Lab. CBC Latest Ref Rng & Units 03/28/2021 03/27/2021 03/26/2021  WBC 4.0 - 10.5 K/uL 7.1 7.5 7.6  Hemoglobin 13.0 - 17.0 g/dL 7.9(L) 7.8(L) 7.9(L)  Hematocrit 39.0 - 52.0 % 25.2(L) 24.3(L) 25.5(L)  Platelets 150 - 400 K/uL 382 360 356   CMP Latest Ref Rng & Units 03/29/2021 03/28/2021 03/27/2021  Glucose 70 - 99 mg/dL 126(H) 157(H) 141(H)  BUN 8 - 23 mg/dL 28(H) 29(H) 31(H)  Creatinine 0.61 - 1.24 mg/dL 0.99 1.07 1.06  Sodium 135 - 145 mmol/L 132(L) 133(L) 135  Potassium 3.5 - 5.1 mmol/L 4.2 4.5 4.3  Chloride 98 - 111 mmol/L 100 102 101  CO2 22 - 32 mmol/L 25 25 26   Calcium 8.9 - 10.3 mg/dL 8.3(L) 8.1(L) 7.9(L)  Total Protein 6.5 - 8.1 g/dL - 5.7(L) -  Total Bilirubin 0.3 - 1.2 mg/dL - 0.6 -  Alkaline Phos 38 - 126 U/L - 77 -  AST 15 - 41 U/L - 24 -  ALT 0 - 44 U/L - 39 -     Pertinent  Imaging today Plain films and CT images have been personally visualized and interpreted; radiology reports have been reviewed. Decision making incorporated into the Impression / Recommendations.  I spent more than 35 minutes for this patient encounter including review of prior medical records, coordination of care  with greater than 50% of time being face to face/counseling and discussing diagnostics/treatment plan with the patient/family.  Electronically signed by:   Rosiland Oz, MD Infectious Disease Physician [facility] Pager: [phone]

## 2021-03-30 NOTE — Progress Notes (Addendum)
[facility]  PROGRESS NOTE  Travis Conrad QQP:[id number] DOB: May 10, 1951 DOA: 02/19/2021 PCP: [name], MD   Brief HPI:   70 year old male with medical history of atrial fibrillation, dyslipidemia, hypertension, diverticulosis presented with nausea, vomiting abdominal pain.  In the ED he was found to have BP of 84/65, heart rate 57, respiration 20/min, O2 sats 91%.  CT abdomen/pelvis showed acute sigmoid diverticulitis with small extraluminal gas and fluid collection in the sigmoid mesocolon, 3.5 x 1.5 x 2.7 cm consistent with perforation and abscess.  Dilated proximal and decompressed distal small bowel loops, possible ileus. 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  Developed postop ileus Urology and ID were consulted for epididymitis 9/19 developed postop anemia, required 1 unit PRBC Positive left knee pain, positive effusion, underwent left knee aspiration injection with good toleration, fluid showed no crystals, gram stain with no organisms 9/26, NG tube was removed 9/27 patient had vomiting abdominal distention, x-ray showed recurrent ileus, NG tube was replaced and connected to intermittent suction 10/6 patient started on clear liquid diet, diet is being advanced slowly    Subjective   Patient seen and examined, urine culture grew VRE.  ID was consulted today and patient started on Zyvox.   Assessment/Plan:     Severe sepsis -Secondary to perforated diverticulitis, s/p colectomy -Left psoas collection, postop ileus -CT on 9/27 showed complex fluid collection along left iliopsoas, SBO with transition in mid jejunum -General surgery consulted IR for drainage of left psoas abscess, IR did  not recommend aspirating at this point of time -S/p Gastrografin study on 03/16/2021, patient being managed by intermittent NG tube clamping, remains on TPN -Antibiotics managed per ID, completed Zosyn on 10/7 -CT scan abdomen on 03/22/2021 showed ileus with improved distention of small bowel, stable psoas collection -Patient with persistent/recurrent ileus, managed by general surgery -NG tube was discontinued and patient advanced to full liquid diet -He is tolerating Ensure, 3 times a day -Also on TPN -Diet and oral intake has been improving, tolerating full liquid diet, to be advanced to soft diet -Continue TPN at half rate  Postop anemia -S/p 1 unit PRBC on 03/07/2021 -Transfuse for hemoglobin less than 7  Paroxysmal atrial fibrillation/hypertension -Patient was on amiodarone and metoprolol with good rate control -Continue enoxaparin for anticoagulation -Holding DOAC until p.o. more consistent -He is off Cardizem drip -Currently on p.o. Cardizem, amiodarone  VRE UTI -Urine culture growing vancomycin-resistant Enterococcus -Discussed with Dr. West Bali, she recommends starting Zyvox  Right epididymitis -Resolved -Completed therapy with doxycycline  Acute kidney injury/hypokalemia/hyponatremia/hypophosphatemia -Continue TPN per protocol -Electrolyte abnormalities have been resolved  Alcohol withdrawal -Resolved  Left knee arthritis -Clinically improved after steroid injection  Hypertension -Blood pressure controlled  Acute hypoxemic respiratory failure -Significantly improved -Continue oxygen 2 L/min -Patient is off steroids  Anasarca -Volume status improving with TPN IV Lasix -IV Lasix discontinued and started on p.o. Lasix 20 mg daily -Net -30 L     Scheduled medications:    (feeding supplement) PROSource Plus  30 mL Oral TID BM   acetaminophen  650 mg Oral Q6H   amiodarone  200 mg Oral Daily   Chlorhexidine Gluconate Cloth  6 each Topical Daily    diltiazem  60 mg Oral Q6H   docusate sodium  100 mg Oral BID  enoxaparin (LOVENOX) injection  100 mg Subcutaneous Q12H   feeding supplement  237 mL Oral TID BM   furosemide  20 mg Oral Daily   insulin aspart  0-9 Units Subcutaneous TID WC   lidocaine  1 patch Transdermal Q24H   linezolid  600 mg Oral Q12H   methocarbamol  1,000 mg Oral QID   metoCLOPramide (REGLAN) injection  10 mg Intravenous Q6H   nicotine  21 mg Transdermal Daily   pantoprazole sodium  40 mg Oral BID   sodium chloride flush  10 mL Intracatheter Q12H     Data Reviewed:   CBG:  Recent Labs  Lab 03/29/21 1748 03/29/21 2113 03/30/21 0834 03/30/21 1249 03/30/21 1604  GLUCAP 130* 135* 116* 131* 133*    SpO2: 95 % O2 Flow Rate (L/min): 2 L/min    Vitals:   03/30/21 0441 03/30/21 0835 03/30/21 1251 03/30/21 1600  BP: 140/61 (!) 109/46 133/89 134/66  Pulse: (!) 104   (!) 105  Resp: (!) 21   20  Temp: 98.9 F (37.2 C) 98.4 F (36.9 C)  98.1 F (36.7 C)  TempSrc: Oral Oral  Oral  SpO2: 91%   95%  Weight:      Height:         Intake/Output Summary (Last 24 hours) at 03/30/2021 1840 Last data filed at 03/30/2021 0600 Gross per 24 hour  Intake 847.84 ml  Output 2800 ml  Net -1952.16 ml    10/10 1901 - 10/12 0700 In: 2005.1 [P.O.:557; I.V.:1448.1] Out: 4200 [Urine:3800]  Filed Weights   03/10/21 0447 03/16/21 0343 03/28/21 0500  Weight: 102 kg 98.9 kg 93.6 kg    Data Reviewed: Basic Metabolic Panel: Recent Labs  Lab 03/24/21 0439 03/25/21 1043 03/26/21 0450 03/27/21 0148 03/28/21 0504 03/29/21 0146  NA 138 136 134* 135 133* 132*  K 3.7 4.1 4.0 4.3 4.5 4.2  CL 100 102 101 101 102 100  CO2 28 29 27 26 25 25   GLUCOSE 177* 114* 170* 141* 157* 126*  BUN 44* 35* 30* 31* 29* 28*  CREATININE 1.19 1.02 1.03 1.06 1.07 0.99  CALCIUM 8.3* 7.9* 7.7* 7.9* 8.1* 8.3*  MG 2.3  --  2.1  --  2.1 2.0  PHOS 4.5  --  3.4  --  3.5 4.0   Liver Function Tests: Recent Labs  Lab 03/24/21 0439  03/28/21 0504  AST 33 24  ALT 47* 39  ALKPHOS 88 77  BILITOT 0.6 0.6  PROT 6.1* 5.7*  ALBUMIN 1.7* 1.7*   No results for input(s): LIPASE, AMYLASE in the last 168 hours. No results for input(s): AMMONIA in the last 168 hours. CBC: Recent Labs  Lab 03/24/21 1300 03/25/21 1043 03/26/21 0450 03/27/21 0148 03/28/21 0504  WBC 8.0 8.9 7.6 7.5 7.1  HGB 7.7* 8.4* 7.9* 7.8* 7.9*  HCT 25.2* 27.1* 25.5* 24.3* 25.2*  MCV 98.8 99.3 98.1 96.8 97.3  PLT 346 371 356 360 382   Cardiac Enzymes: No results for input(s): CKTOTAL, CKMB, CKMBINDEX, TROPONINI in the last 168 hours. BNP (last 3 results) No results for input(s): BNP in the last 8760 hours.  ProBNP (last 3 results) No results for input(s): PROBNP in the last 8760 hours.  CBG: Recent Labs  Lab 03/29/21 1748 03/29/21 2113 03/30/21 0834 03/30/21 1249 03/30/21 1604  GLUCAP 130* 135* 116* 131* 133*       Radiology Reports  DG CHEST PORT 1 VIEW  Result Date: 03/29/2021 CLINICAL DATA:  Chest pain EXAM: PORTABLE  CHEST 1 VIEW COMPARISON:  03/25/2021 FINDINGS: Right upper extremity PICC tip overlies the superior cavoatrial junction. Unchanged cardiomediastinal silhouette. There are faint patchy bilateral airspace opacities. There is no large pleural effusion or visible pneumothorax. No acute osseous abnormality. IMPRESSION: Faint patchy bilateral airspace opacities, concerning for developing of multifocal infectious/inflammatory process. Recommend radiographic follow-up. Electronically Signed   By: Maurine Simmering M.D.   On: 03/29/2021 13:55       Antibiotics: Anti-infectives (From admission, onward)    Start     Dose/Rate Route Frequency Ordered Stop   03/30/21 1130  linezolid (ZYVOX) tablet 600 mg        600 mg Oral Every 12 hours 03/30/21 1043     03/09/21 0800  piperacillin-tazobactam (ZOSYN) IVPB 3.375 g        3.375 g 12.5 mL/hr over 240 Minutes Intravenous Every 8 hours 03/09/21 0706 03/25/21 2034   02/26/21 0100   doxycycline (VIBRAMYCIN) 100 mg in sodium chloride 0.9 % 250 mL IVPB        100 mg 125 mL/hr over 120 Minutes Intravenous 2 times daily 02/26/21 0005 03/08/21 2359   02/20/21 0300  piperacillin-tazobactam (ZOSYN) IVPB 3.375 g        3.375 g 12.5 mL/hr over 240 Minutes Intravenous Every 8 hours 02/19/21 1907 03/08/21 2359   02/19/21 1900  ceFEPIme (MAXIPIME) 2 g in sodium chloride 0.9 % 100 mL IVPB  Status:  Discontinued        2 g 200 mL/hr over 30 Minutes Intravenous  Once 02/19/21 1857 02/19/21 1906   02/19/21 1900  metroNIDAZOLE (FLAGYL) IVPB 500 mg  Status:  Discontinued        500 mg 100 mL/hr over 60 Minutes Intravenous  Once 02/19/21 1857 02/19/21 1906   02/19/21 1815  piperacillin-tazobactam (ZOSYN) IVPB 3.375 g        3.375 g 100 mL/hr over 30 Minutes Intravenous  Once 02/19/21 1809 02/19/21 1944         DVT prophylaxis: Lovenox  Code Status: Full code  Family Communication: No family at bedside   Consultants:   Procedures:     Objective    Physical Examination:  General-appears in no acute distress Heart-S1-S2, regular, no murmur auscultated Lungs-clear to auscultation bilaterally, no wheezing or crackles auscultated Abdomen-soft, nontender, no organomegaly, colostomy in place Extremities-2+  edema in the lower extremities Neuro-alert, oriented x3, no focal deficit noted  Status is: Inpatient  Dispo: The patient is from: Home              Anticipated d/c is to: [facility]              Anticipated d/c date is: 04/02/2021              Patient currently not stable for discharge  Barrier to discharge-ongoing treatment for UTI  COVID-19 Labs  No results for input(s): DDIMER, FERRITIN, LDH, CRP in the last 72 hours.  Lab Results  Component Value Date   [place] NEGATIVE 02/19/2021   [place] NEGATIVE 09/30/2019   [place] NEGATIVE 08/16/2019            Recent Results (from the past 240 hour(s))  Urine Culture     Status: Abnormal    Collection Time: 03/28/21  8:11 AM   Specimen: Urine, Clean Catch  Result Value Ref Range Status   Specimen Description URINE, CLEAN CATCH  Final   Special Requests   Final    NONE Performed at [facility], [address] [place], [place] [address]    Culture (A)  Final    >=100,000 COLONIES/mL VANCOMYCIN RESISTANT ENTEROCOCCUS   Report Status 03/30/2021 FINAL  Final   Organism ID, Bacteria VANCOMYCIN RESISTANT ENTEROCOCCUS (A)  Final      Susceptibility   Vancomycin resistant enterococcus - MIC*    AMPICILLIN >=32 RESISTANT Resistant     NITROFURANTOIN 32 SENSITIVE Sensitive     VANCOMYCIN >=32 RESISTANT Resistant     LINEZOLID 2 SENSITIVE Sensitive     * >=100,000 COLONIES/mL VANCOMYCIN RESISTANT ENTEROCOCCUS    [place]   [facility] If 7PM-7AM, please contact night-coverage at [email or web address], Office  [phone]   03/30/2021, 6:40 PM  LOS: 38 days

## 2021-03-30 NOTE — Progress Notes (Signed)
PT Cancellation Note  Patient Details Name: Travis Conrad MRN: [id number] DOB: April 09, 1951   Cancelled Treatment:    Reason Eval/Treat Not Completed: Other (comment) MD currently in room working with patient- will attempt to return later as/if time and schedule allow.   Windell Norfolk, DPT, PN2   Supplemental Physical Therapist [facility]    Pager [phone] Acute Rehab Office [phone]

## 2021-03-31 DIAGNOSIS — I482 Chronic atrial fibrillation, unspecified: Secondary | ICD-10-CM | POA: Diagnosis not present

## 2021-03-31 DIAGNOSIS — N179 Acute kidney failure, unspecified: Secondary | ICD-10-CM | POA: Diagnosis not present

## 2021-03-31 DIAGNOSIS — K5720 Diverticulitis of large intestine with perforation and abscess without bleeding: Secondary | ICD-10-CM | POA: Diagnosis not present

## 2021-03-31 DIAGNOSIS — N451 Epididymitis: Secondary | ICD-10-CM | POA: Diagnosis not present

## 2021-03-31 LAB — COMPREHENSIVE METABOLIC PANEL
ALT: 29 U/L (ref 0–44)
AST: 20 U/L (ref 15–41)
Albumin: 1.8 g/dL — ABNORMAL LOW (ref 3.5–5.0)
Alkaline Phosphatase: 75 U/L (ref 38–126)
Anion gap: 8 (ref 5–15)
BUN: 24 mg/dL — ABNORMAL HIGH (ref 8–23)
CO2: 24 mmol/L (ref 22–32)
Calcium: 8 mg/dL — ABNORMAL LOW (ref 8.9–10.3)
Chloride: 97 mmol/L — ABNORMAL LOW (ref 98–111)
Creatinine, Ser: 1.08 mg/dL (ref 0.61–1.24)
GFR, Estimated: >60 mL/min (ref >60)
Glucose, Bld: 143 mg/dL — ABNORMAL HIGH (ref 70–99)
Potassium: 4 mmol/L (ref 3.5–5.1)
Sodium: 129 mmol/L — ABNORMAL LOW (ref 135–145)
Total Bilirubin: 0.8 mg/dL (ref 0.3–1.2)
Total Protein: 5.7 g/dL — ABNORMAL LOW (ref 6.5–8.1)

## 2021-03-31 LAB — PHOSPHORUS: Phosphorus: 4.1 mg/dL (ref 2.5–4.6)

## 2021-03-31 LAB — MAGNESIUM: Magnesium: 2.1 mg/dL (ref 1.7–2.4)

## 2021-03-31 LAB — GLUCOSE, CAPILLARY
Glucose-Capillary: 135 mg/dL — ABNORMAL HIGH (ref 70–99)
Glucose-Capillary: 133 mg/dL — ABNORMAL HIGH (ref 70–99)
Glucose-Capillary: 133 mg/dL — ABNORMAL HIGH (ref 70–99)

## 2021-03-31 MED ORDER — OXYCODONE HCL 5 MG PO TABS
5.0000 mg | ORAL_TABLET | Freq: Four times a day (QID) | ORAL | Status: DC | PRN
  Administered 2021-03-31 – 2021-04-01 (×3): 5 mg via ORAL
  Administered 2021-04-02 – 2021-04-05 (×7): 10 mg via ORAL
  Filled 2021-03-31 (×2): qty 2
  Filled 2021-03-31: qty 1
  Filled 2021-03-31 (×2): qty 2
  Filled 2021-03-31: qty 1
  Filled 2021-03-31 (×3): qty 2
  Filled 2021-03-31: qty 1

## 2021-03-31 MED ORDER — TRACE MINERALS CU-MN-SE-ZN 300-55-60-3000 MCG/ML IV SOLN
INTRAVENOUS | Status: AC
  Filled 2021-03-31: qty 676

## 2021-03-31 MED ORDER — MORPHINE SULFATE (PF) 2 MG/ML IV SOLN
1.0000 mg | INTRAVENOUS | Status: DC | PRN
  Administered 2021-03-31 – 2021-04-06 (×6): 1 mg via INTRAVENOUS
  Filled 2021-03-31 (×6): qty 1

## 2021-03-31 MED ORDER — DRONABINOL 2.5 MG PO CAPS
2.5000 mg | ORAL_CAPSULE | Freq: Two times a day (BID) | ORAL | Status: DC
  Administered 2021-03-31 (×2): 2.5 mg via ORAL
  Filled 2021-03-31 (×2): qty 1

## 2021-03-31 NOTE — Progress Notes (Signed)
Physical Therapy Treatment Patient Details Name: Travis Conrad MRN: [id number] DOB: 09-13-1950 Today's Date: 03/31/2021   History of Present Illness 70 yo admitted 9/3 with abdominal pain, vomiting and ileus. 9/4 Afib with RVR. 9/8 intrabdominal abscess s/p IR drain placed. 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 Pt found to have lt iliopsoas hematoma on CT 9/16.  9/20 left knee aspiration. 9/26 NG tube removed. 9/27 NGT replaced due to vomiting. 10/1 atelectasis. Pt with Afib and elevated HR.  Work up neg so far and MD cleared pt for therapy on 10/11.  PMhx: HTN, HLD, Afib, ETOH use    PT Comments    Pt agitated with hospital LOS in general, sacral pain when asked to attempt chair and general debility. Pt states significant desire to want to improve, receive rehab and return home at a higher function. PT able to walk and perform standing exercises with HR 86-105 limited by fatigue with initial dizziness. Pt educated for need to continue to progress all mobility, tolerance for sitting and HEP. Pt with noted small sacral wound and discussed air mattress with staff and MD.   Supine 106/57 Sitting 123/47    Recommendations for follow up therapy are one component of a multi-disciplinary discharge planning process, led by the attending physician.  Recommendations may be updated based on patient status, additional functional criteria and insurance authorization.  Follow Up Recommendations  CIR;Supervision/Assistance - 24 hour     Equipment Recommendations  3in1 (PT)    Recommendations for Other Services       Precautions / Restrictions Precautions Precautions: Fall Precaution Comments: abdominal incision, watch HR and BP     Mobility  Bed Mobility Overal bed mobility: Needs Assistance Bed Mobility: Rolling;Sidelying to Sit Rolling: Min guard Sidelying to sit: Min guard;HOB elevated       General bed mobility comments: HOB  20 degrees with mod cues for sequence and increased time. Pt initially requesting physical assist and annoyed by direction on how to perform but ultimately was able to rise from bed without physical assist    Transfers Overall transfer level: Needs assistance   Transfers: Sit to/from Stand Sit to Stand: Min assist         General transfer comment: min assist to rise from bed initially with repeated stands x 2 minguard with cues for hand placement  Ambulation/Gait Ambulation/Gait assistance: Min guard Gait Distance (Feet): 140 Feet Assistive device: Rolling walker (2 wheeled) Gait Pattern/deviations: Step-through pattern;Trunk flexed   Gait velocity interpretation: 1.31 - 2.62 ft/sec, indicative of limited community ambulator General Gait Details: cues for posture, position in RW and safety. Pt able to self-regulate distance   Stairs             Wheelchair Mobility    Modified Rankin (Stroke Patients Only)       Balance Overall balance assessment: Needs assistance Sitting-balance support: No upper extremity supported Sitting balance-Leahy Scale: Good Sitting balance - Comments: able to sit EOB without support   Standing balance support: Bilateral upper extremity supported;During functional activity Standing balance-Leahy Scale: Poor Standing balance comment: reliant on RW in standing                            Cognition Arousal/Alertness: Awake/alert Behavior During Therapy: Agitated Overall Cognitive Status: Impaired/Different from baseline Area of Impairment: Safety/judgement;Awareness;Problem solving  Current Attention Level: Selective Memory: Decreased short-term memory Following Commands: Follows one step commands consistently Safety/Judgement: Decreased awareness of deficits;Decreased awareness of safety   Problem Solving: Slow processing General Comments: pt agitated with requests to get to chair and with  suggestions for performing self care rather than having Wife comb his hair. Pt required redirection and encouragement at times with education for why he needs to participate and progress to achieve home function      Exercises General Exercises - Lower Extremity Hip Flexion/Marching: AROM;Both;Standing;10 reps Heel Raises: AROM;Both;Standing;10 reps    General Comments        Pertinent Vitals/Pain Pain Assessment: No/denies pain    Home Living                      Prior Function            PT Goals (current goals can now be found in the care plan section) Progress towards PT goals: Progressing toward goals    Frequency    Min 3X/week      PT Plan Current plan remains appropriate    Co-evaluation              AM-PAC PT "6 Clicks" Mobility   Outcome Measure  Help needed turning from your back to your side while in a flat bed without using bedrails?: A Little Help needed moving from lying on your back to sitting on the side of a flat bed without using bedrails?: A Little Help needed moving to and from a bed to a chair (including a wheelchair)?: A Little Help needed standing up from a chair using your arms (e.g., wheelchair or bedside chair)?: A Little Help needed to walk in hospital room?: A Little Help needed climbing 3-5 steps with a railing? : A Lot 6 Click Score: 17    End of Session Equipment Utilized During Treatment: Gait belt Activity Tolerance: Patient tolerated treatment well Patient left: in bed;with family/visitor present (pt sitting EOB as pt refused chair but willing to attempt 1hr seated EOB) Nurse Communication: Mobility status PT Visit Diagnosis: Other abnormalities of gait and mobility (R26.89);Difficulty in walking, not elsewhere classified (R26.2)     Time: 8280-0349 PT Time Calculation (min) (ACUTE ONLY): 29 min  Charges:  $Gait Training: 8-22 mins $Therapeutic Exercise: 8-22 mins                     Theodosia, PT Acute  Rehabilitation Services Pager: [phone] Office: [place] 03/31/2021, 11:25 AM

## 2021-03-31 NOTE — Progress Notes (Signed)
[facility]  PROGRESS NOTE  Travis Conrad KGM:[id number] DOB: 02-09-1951 DOA: 02/19/2021 PCP: [name], MD   Brief HPI:   70 year old male with medical history of atrial fibrillation, dyslipidemia, hypertension, diverticulosis presented with nausea, vomiting abdominal pain.  In the ED he was found to have BP of 84/65, heart rate 57, respiration 20/min, O2 sats 91%.  CT abdomen/pelvis showed acute sigmoid diverticulitis with small extraluminal gas and fluid collection in the sigmoid mesocolon, 3.5 x 1.5 x 2.7 cm consistent with perforation and abscess.  Dilated proximal and decompressed distal small bowel loops, possible ileus. 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  Developed postop ileus Urology and ID were consulted for epididymitis 9/19 developed postop anemia, required 1 unit PRBC Positive left knee pain, positive effusion, underwent left knee aspiration injection with good toleration, fluid showed no crystals, gram stain with no organisms 9/26, NG tube was removed 9/27 patient had vomiting abdominal distention, x-ray showed recurrent ileus, NG tube was replaced and connected to intermittent suction 10/6 patient started on clear liquid diet, diet is being advanced slowly    Subjective   Patient seen and examined, had episode of tremors yesterday while working with physical therapy.  He was on sotalol at home which has been discontinued.  He does have history of upper extremity tremors.  Denies tremors this morning.   Assessment/Plan:     Severe sepsis -Secondary to perforated diverticulitis, s/p colectomy -Left psoas collection, postop ileus -CT on 9/27 showed complex fluid collection  along left iliopsoas, SBO with transition in mid jejunum -General surgery consulted IR for drainage of left psoas abscess, IR did not recommend aspirating at this point of time -S/p Gastrografin study on 03/16/2021, patient being managed by intermittent NG tube clamping, remains on TPN -Antibiotics managed per ID, completed Zosyn on 10/7 -CT scan abdomen on 03/22/2021 showed ileus with improved distention of small bowel, stable psoas collection -Patient with persistent/recurrent ileus, managed by general surgery -NG tube was discontinued and patient advanced to full liquid diet -He is tolerating Ensure, 3 times a day -Also on TPN -Diet and oral intake has been improving, tolerating full liquid diet, to be advanced to soft diet -Continue TPN at half rate -Plan to wean off TPN and start p.o. diet as per surgery  Postop anemia -S/p 1 unit PRBC on 03/07/2021 -Transfuse for hemoglobin less than 7  Paroxysmal atrial fibrillation/hypertension -Patient was on amiodarone and metoprolol with good rate control -Continue enoxaparin for anticoagulation -Holding DOAC until p.o. more consistent -He is off Cardizem drip -Currently on p.o. Cardizem, amiodarone  VRE UTI -Urine culture growing vancomycin-resistant Enterococcus -Discussed with Dr. West Bali, she recommends starting Zyvox -Patient started on Zyvox  Tremors -Patient is a history of questionable benign essential tremor -Got worse yesterday while working with PT -Denies tremors this morning -Patient was taking sotalol at home which is currently on hold  Right epididymitis -Resolved -Completed therapy with doxycycline  Acute kidney injury/hypokalemia/hyponatremia/hypophosphatemia -Continue TPN per protocol -Electrolyte abnormalities have been resolved  Alcohol withdrawal -Resolved  Left knee arthritis -Clinically improved after steroid injection  Hypertension -Blood pressure controlled  Acute hypoxemic respiratory  failure -Significantly improved -Continue oxygen 2 L/min -Patient is off steroids  Anasarca -Volume status improving with TPN IV Lasix -IV Lasix discontinued and started on  p.o. Lasix 20 mg daily -Net -31 L  Hyponatremia -Sodium is 129 -Check serum osmolality   Scheduled medications:    (feeding supplement) PROSource Plus  30 mL Oral TID BM   acetaminophen  650 mg Oral Q6H   amiodarone  200 mg Oral Daily   Chlorhexidine Gluconate Cloth  6 each Topical Daily   diltiazem  60 mg Oral Q6H   docusate sodium  100 mg Oral BID   dronabinol  2.5 mg Oral BID AC   enoxaparin (LOVENOX) injection  100 mg Subcutaneous Q12H   feeding supplement  237 mL Oral TID BM   furosemide  20 mg Oral Daily   insulin aspart  0-9 Units Subcutaneous TID WC   lidocaine  1 patch Transdermal Q24H   linezolid  600 mg Oral Q12H   methocarbamol  1,000 mg Oral QID   metoCLOPramide (REGLAN) injection  10 mg Intravenous Q6H   nicotine  21 mg Transdermal Daily   pantoprazole sodium  40 mg Oral BID   sodium chloride flush  10 mL Intracatheter Q12H     Data Reviewed:   CBG:  Recent Labs  Lab 03/30/21 1249 03/30/21 1604 03/30/21 2021 03/31/21 0725 03/31/21 1123  GLUCAP 131* 133* 124* 135* 133*    SpO2: 95 % O2 Flow Rate (L/min): 2 L/min    Vitals:   03/30/21 1924 03/30/21 2000 03/31/21 0400 03/31/21 1137  BP: (!) 121/52 (!) 119/51 (!) 116/54 (!) 117/43  Pulse: 97 98 95 97  Resp: 18 20 20 19   Temp: 98 F (36.7 C)  99 F (37.2 C) 98.9 F (37.2 C)  TempSrc: Oral  Oral Oral  SpO2: 97% 94% 92% 95%  Weight:      Height:         Intake/Output Summary (Last 24 hours) at 03/31/2021 1334 Last data filed at 03/31/2021 0609 Gross per 24 hour  Intake 634.54 ml  Output 1800 ml  Net -1165.46 ml    10/11 1901 - 10/13 0700 In: 1456.2 [P.O.:437; I.V.:1019.2] Out: 4600 [Urine:4200]  Filed Weights   03/10/21 0447 03/16/21 0343 03/28/21 0500  Weight: 102 kg 98.9 kg 93.6 kg    Data  Reviewed: Basic Metabolic Panel: Recent Labs  Lab 03/26/21 0450 03/27/21 0148 03/28/21 0504 03/29/21 0146 03/31/21 0441  NA 134* 135 133* 132* 129*  K 4.0 4.3 4.5 4.2 4.0  CL 101 101 102 100 97*  CO2 27 26 25 25 24   GLUCOSE 170* 141* 157* 126* 143*  BUN 30* 31* 29* 28* 24*  CREATININE 1.03 1.06 1.07 0.99 1.08  CALCIUM 7.7* 7.9* 8.1* 8.3* 8.0*  MG 2.1  --  2.1 2.0 2.1  PHOS 3.4  --  3.5 4.0 4.1   Liver Function Tests: Recent Labs  Lab 03/28/21 0504 03/31/21 0441  AST 24 20  ALT 39 29  ALKPHOS 77 75  BILITOT 0.6 0.8  PROT 5.7* 5.7*  ALBUMIN 1.7* 1.8*   No results for input(s): LIPASE, AMYLASE in the last 168 hours. No results for input(s): AMMONIA in the last 168 hours. CBC: Recent Labs  Lab 03/25/21 1043 03/26/21 0450 03/27/21 0148 03/28/21 0504  WBC 8.9 7.6 7.5 7.1  HGB 8.4* 7.9* 7.8* 7.9*  HCT 27.1* 25.5* 24.3* 25.2*  MCV 99.3 98.1 96.8 97.3  PLT 371 356 360 382   Cardiac Enzymes: No results for input(s): CKTOTAL, CKMB, CKMBINDEX, TROPONINI in the last 168 hours. BNP (last 3 results) No results for input(s): BNP in the last 8760  hours.  ProBNP (last 3 results) No results for input(s): PROBNP in the last 8760 hours.  CBG: Recent Labs  Lab 03/30/21 1249 03/30/21 1604 03/30/21 2021 03/31/21 0725 03/31/21 1123  GLUCAP 131* 133* 124* 135* 133*       Radiology Reports  No results found.     Antibiotics: Anti-infectives (From admission, onward)    Start     Dose/Rate Route Frequency Ordered Stop   03/30/21 1130  linezolid (ZYVOX) tablet 600 mg        600 mg Oral Every 12 hours 03/30/21 1043 04/04/21 0959   03/09/21 0800  piperacillin-tazobactam (ZOSYN) IVPB 3.375 g        3.375 g 12.5 mL/hr over 240 Minutes Intravenous Every 8 hours 03/09/21 0706 03/25/21 2034   02/26/21 0100  doxycycline (VIBRAMYCIN) 100 mg in sodium chloride 0.9 % 250 mL IVPB        100 mg 125 mL/hr over 120 Minutes Intravenous 2 times daily 02/26/21 0005 03/08/21 2359    02/20/21 0300  piperacillin-tazobactam (ZOSYN) IVPB 3.375 g        3.375 g 12.5 mL/hr over 240 Minutes Intravenous Every 8 hours 02/19/21 1907 03/08/21 2359   02/19/21 1900  ceFEPIme (MAXIPIME) 2 g in sodium chloride 0.9 % 100 mL IVPB  Status:  Discontinued        2 g 200 mL/hr over 30 Minutes Intravenous  Once 02/19/21 1857 02/19/21 1906   02/19/21 1900  metroNIDAZOLE (FLAGYL) IVPB 500 mg  Status:  Discontinued        500 mg 100 mL/hr over 60 Minutes Intravenous  Once 02/19/21 1857 02/19/21 1906   02/19/21 1815  piperacillin-tazobactam (ZOSYN) IVPB 3.375 g        3.375 g 100 mL/hr over 30 Minutes Intravenous  Once 02/19/21 1809 02/19/21 1944         DVT prophylaxis: Lovenox  Code Status: Full code  Family Communication: No family at bedside   Consultants:   Procedures:     Objective    Physical Examination:  General-appears in no acute distress Heart-S1-S2, regular, no murmur auscultated Lungs-clear to auscultation bilaterally, no wheezing or crackles auscultated Abdomen-soft, nontender, no organomegaly Extremities-1+ edema in the lower extremities Neuro-alert, oriented x3, no focal deficit noted  Status is: Inpatient  Dispo: The patient is from: Home              Anticipated d/c is to: CIR              Anticipated d/c date is: 04/02/2021              Patient currently not stable for discharge  Barrier to discharge-ongoing treatment for UTI  COVID-19 Labs  No results for input(s): DDIMER, FERRITIN, LDH, CRP in the last 72 hours.  Lab Results  Component Value Date   [place] NEGATIVE 02/19/2021   [place] NEGATIVE 09/30/2019   [place] NEGATIVE 08/16/2019         Recent Results (from the past 240 hour(s))  Urine Culture     Status: Abnormal   Collection Time: 03/28/21  8:11 AM   Specimen: Urine, Clean Catch  Result Value Ref Range Status   Specimen Description URINE, CLEAN CATCH  Final   Special Requests   Final     NONE Performed at [facility], [place] [address], [place], [place] [address]    Culture (A)  Final    >=100,000 COLONIES/mL VANCOMYCIN RESISTANT ENTEROCOCCUS   Report Status 03/30/2021 FINAL  Final   Organism ID, Bacteria VANCOMYCIN RESISTANT ENTEROCOCCUS (A)  Final      Susceptibility   Vancomycin resistant enterococcus - MIC*    AMPICILLIN >=32 RESISTANT Resistant     NITROFURANTOIN 32 SENSITIVE Sensitive     VANCOMYCIN >=32 RESISTANT Resistant     LINEZOLID 2 SENSITIVE Sensitive     * >=100,000 COLONIES/mL VANCOMYCIN RESISTANT ENTEROCOCCUS    [place]   [facility] If 7PM-7AM, please contact night-coverage at [email or web address], Office  [phone]   03/31/2021, 1:34 PM  LOS: 39 days

## 2021-03-31 NOTE — Progress Notes (Signed)
Occupational Therapy Treatment Patient Details Name: Travis Conrad MRN: [id number] DOB: December 19, 1950 Today's Date: 03/31/2021   History of present illness 70 yo admitted 9/3 with abdominal pain, vomiting and ileus. 9/4 Afib with RVR. 9/8 intrabdominal abscess s/p IR drain placed. 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 Pt found to have lt iliopsoas hematoma on CT 9/16.  9/20 left knee aspiration. 9/26 NG tube removed. 9/27 NGT replaced due to vomiting. 10/1 atelectasis. Pt with Afib and elevated HR.  Work up neg so far and MD cleared pt for therapy on 10/11.  PMhx: HTN, HLD, Afib, ETOH use   OT comments  Patient sitting on eob and willing to participate with OT treatment. Patient seen to address grooming seated on eob and standing tolerance. Patient increased performance with getting to eob and tolerated 2.5 minutes first stand and 1 minute 45 seconds second stand.  Patient was able to get feet into bed and return to supine.  Acute OT to continue to follow.    Recommendations for follow up therapy are one component of a multi-disciplinary discharge planning process, led by the attending physician.  Recommendations may be updated based on patient status, additional functional criteria and insurance authorization.    Follow Up Recommendations  CIR    Equipment Recommendations  3 in 1 bedside commode    Recommendations for Other Services      Precautions / Restrictions Precautions Precautions: Fall Precaution Comments: abdominal incision, watch HR and BP       Mobility Bed Mobility Overal bed mobility: Needs Assistance Bed Mobility: Sit to Supine;Supine to Sit     Supine to sit: Min guard Sit to supine: Min guard   General bed mobility comments: improvedment with getting to eob and back to supine    Transfers Overall transfer level: Needs assistance Equipment used: Rolling walker (2 wheeled) Transfers: Sit to/from  Stand Sit to Stand: Min guard         General transfer comment: min guard assist to stand to rw    Balance Overall balance assessment: Needs assistance Sitting-balance support: No upper extremity supported Sitting balance-Leahy Scale: Good Sitting balance - Comments: able to sit EOB without support   Standing balance support: Bilateral upper extremity supported;During functional activity Standing balance-Leahy Scale: Poor Standing balance comment: able to tolerate 2.5 minutes first stand and 1.75 second stand                           ADL either performed or assessed with clinical judgement   ADL Overall ADL's : Needs assistance/impaired     Grooming: Wash/dry hands;Wash/dry face;Oral care;Brushing hair;Set up;Sitting Grooming Details (indicate cue type and reason): performed while seated on eob                               General ADL Comments: required no physical assistance with grooming     Vision       Perception     Praxis      Cognition Arousal/Alertness: Awake/alert Behavior During Therapy: WFL for tasks assessed/performed Overall Cognitive Status: Impaired/Different from baseline Area of Impairment: Safety/judgement;Awareness;Problem solving                   Current Attention Level: Selective Memory: Decreased short-term memory Following Commands: Follows one step commands consistently Safety/Judgement: Decreased awareness of deficits;Decreased awareness of safety Awareness:  Intellectual Problem Solving: Slow processing General Comments: Patient talkative during session        Exercises General Exercises - Lower Extremity Hip Flexion/Marching: AROM;Both;Standing;10 reps Heel Raises: AROM;Both;Standing;10 reps   Shoulder Instructions       General Comments      Pertinent Vitals/ Pain       Pain Assessment: Faces Faces Pain Scale: Hurts little more Pain Location: abdomen Pain Descriptors / Indicators:  Discomfort;Grimacing Pain Intervention(s): Repositioned  Home Living                                          Prior Functioning/Environment              Frequency  Min 2X/week        Progress Toward Goals  OT Goals(current goals can now be found in the care plan section)  Progress towards OT goals: Progressing toward goals  Acute Rehab OT Goals Patient Stated Goal: to get back to baseline OT Goal Formulation: With patient Time For Goal Achievement: 04/12/21 Potential to Achieve Goals: Good ADL Goals Pt Will Perform Grooming: with modified independence;standing Pt Will Perform Lower Body Bathing: with modified independence;sit to/from stand Pt Will Perform Lower Body Dressing: with modified independence;with adaptive equipment Pt Will Transfer to Toilet: with supervision;ambulating Pt Will Perform Toileting - Clothing Manipulation and hygiene: with supervision;sitting/lateral leans;sit to/from stand Additional ADL Goal #1: Pt will independently verbalize 3 strateiges to reduce risk of falls  Plan Discharge plan needs to be updated    Co-evaluation                 AM-PAC OT "6 Clicks" Daily Activity     Outcome Measure   Help from another person eating meals?: A Little Help from another person taking care of personal grooming?: None Help from another person toileting, which includes using toliet, bedpan, or urinal?: A Little Help from another person bathing (including washing, rinsing, drying)?: A Little Help from another person to put on and taking off regular upper body clothing?: A Little Help from another person to put on and taking off regular lower body clothing?: A Lot 6 Click Score: 18    End of Session Equipment Utilized During Treatment: Rolling walker;Oxygen  OT Visit Diagnosis: Muscle weakness (generalized) (M62.81);Pain Pain - Right/Left: Left Pain - part of body: Leg   Activity Tolerance Patient tolerated treatment well    Patient Left with call bell/phone within reach;with bed alarm set;with family/visitor present;in bed   Nurse Communication Mobility status        Time: 3532-9924 OT Time Calculation (min): 33 min  Charges: OT General Charges $OT Visit: 1 Visit OT Treatments $Self Care/Home Management : 8-22 mins $Therapeutic Activity: 8-22 mins  Lodema Hong, OTA   Abdulhamid Olgin Alexis Goodell 03/31/2021, 3:21 PM

## 2021-03-31 NOTE — Progress Notes (Signed)
Inpatient Rehabilitation Admissions Coordinator   I met at bedside with patient and his wife. I have received Auth with [facility] medicare for a possible CIR admit pending bed availability and his functional progress with therapy  when he is medically ready. I reviewed cost of care.  Danne Baxter, RN, MSN Rehab Admissions Coordinator [phone] 03/31/2021 12:35 PM

## 2021-03-31 NOTE — Progress Notes (Addendum)
PHARMACY - TOTAL PARENTERAL NUTRITION CONSULT NOTE  Indication: Prolonged ileus  Patient Measurements: Height: 6' (182.9 cm) Weight: 93.6 kg (206 lb 5.6 oz) IBW/kg (Calculated) : 77.6 TPN AdjBW (KG): 82.6 Body mass index is 27.99 kg/m.  Assessment:  52 YOM with lower abdominal pain/N/V found to have perforated diverticulitis. CT abd on 9/3 found to have active ileus/pSBO from diverticulitis/bowel inflammation. Pharmacy consulted to manage TPN for prolonged ileus. Of note patient has a history pf alcohol dependency and is at high risk for refeeding.   Glucose / Insulin: no hx DM, A1c 5.9% - CBGs < 180.  Off steroid on 10/5. Used 1 unit SSI in past 24 hrs, 20 units insulin in TPN.  Electrolytes: Na 129 (max in TPN), K 4 (Lasix IV 40mg  daily), CoCa 9.7, others WNL Renal: Scr 1.08, BUN 24 Hepatic: LFTs / tbili  WNL, TG 151, albumin 1.7  Intake / Output; MIVF: UOP 1.6 ml/kg/hr on Lasix, net -31.9L per charting, colostomy 41ml charted, on metoclopramide 10 IV Q 6 hr GI Imaging:  - 9/3 CT abd: active ileus/pSBO - 9/16 CT abd: L-iliopsoas fluid collection (abscess or hematoma), resolution of prior diverticular abscess, still w/ evidence of ileus - 9/27 CT abd: continued retroperitoneal complex fluid collection, SBO - 10/4 CT: Left iliopsoas enlargement heterogeneity persists > may represent intramuscular hematoma, infection or mass.  Ieus, degree of small-bowel distention has decreased, pSBO cannot be excluded. - 10/7 KUB: Dilated loops of small bowel are slightly more prominent than on the prior study. Gas is noted within the colon. - 10/10 KUB read pending  GI Surgeries / Procedures:  - 9/10 ex-lap, sigmoid colectomy w/ end colostomy creation, enterorrhaphy, drainage of intra-abd abscess, wound vac placement  Central access: PICC line 02/24/21 TPN start date: 02/25/21  Nutritional Goals, RD Estimated Needs Total Energy Estimated Needs: 2000-2200 Total Protein Estimated Needs: 100-110  grams Total Fluid Estimated Needs: >2L  Current Nutrition:  TPN 10/6 CLD started, minimal intake 10/8 Drank 1.5 Ensure  10/9 FLD- ate 2 Ensure, 3 spoonfuls of pudding and sorbet. Wife to bring in liquid foods patient might like better  10/10 per wife ate 1 Ensure, a little cream of chicken soup, a little of a milk shake (Milk+pudding+ice cream that wife made) 10/11 breakfast ate 4 bites of grits and 1 Ensure.     10/12 did not eat anything  Plan:  Per discussion with surgery, increase back to goal and cycle concentrated TPN (rate 49-92 ml/hr, GIR 1.6-3.19 mg/kg/min) to provide 101 g AA, 304 g CHO and 62  g ILE for a total of 2064 kCal, meeting 100% of patient's needs Electrolytes in TPN: Adjusted with rate:  Na 133mEq/L, K 45 mEq/L, Ca 3 mEq/L, Mg 13 mEq/L; Decreased Phos 10 mmol/L, DG:UYQI 1:1  Add standard MVI, trace elements, folate and thiamine to TPN Change to sensitive SSI TID with meals and increase 30 units regular insulin in TPN given increased dextrose Standard TPN labs daily until at cycle goal then on Mon and Thurs  Cycle TPN as able  F/U PO intake/diet advancement    Benetta Spar, PharmD, BCPS, [facility] Clinical Pharmacist  Please check AMION for all [place] phone numbers After 10:00 PM, call [place]

## 2021-03-31 NOTE — Progress Notes (Signed)
Progress Note  33 Days Post-Op  Subjective: Patient reports he did take in some soft diet but not a lot, getting full quickly. He denies nausea or vomiting. He is having ostomy output. He exhibits some drug seeking behavior this AM, denies abdominal pain but asking for pain medication.   Objective: Vital signs in last 24 hours: Temp:  [98 F (36.7 C)-99 F (37.2 C)] 99 F (37.2 C) (10/13 0400) Pulse Rate:  [95-105] 95 (10/13 0400) Resp:  [18-20] 20 (10/13 0400) BP: (116-134)/(51-89) 116/54 (10/13 0400) SpO2:  [92 %-97 %] 92 % (10/13 0400) Last BM Date: 03/31/21  Intake/Output from previous day: 10/12 0701 - 10/13 0700 In: 634.5 [I.V.:634.5] Out: 1800 [Urine:1800] Intake/Output this shift: No intake/output data recorded.  PE: Gen:  Alert, NAD, pleasant Pulm:  Normal rate and effort.  Abd: Soft, ND, NT, +BS. Stoma appears viable with stool present in ostomy appliance. Midline dressing clean and intact    Lab Results:  No results for input(s): WBC, HGB, HCT, PLT in the last 72 hours. BMET Recent Labs    03/29/21 0146 03/31/21 0441  NA 132* 129*  K 4.2 4.0  CL 100 97*  CO2 25 24  GLUCOSE 126* 143*  BUN 28* 24*  CREATININE 0.99 1.08  CALCIUM 8.3* 8.0*   PT/INR No results for input(s): LABPROT, INR in the last 72 hours. CMP     Component Value Date/Time   NA 129 (L) 03/31/2021 0441   NA 134 09/30/2019 0959   NA 142 03/03/2016 0921   K 4.0 03/31/2021 0441   K 4.3 03/03/2016 0921   CL 97 (L) 03/31/2021 0441   CO2 24 03/31/2021 0441   CO2 25 03/03/2016 0921   GLUCOSE 143 (H) 03/31/2021 0441   GLUCOSE 89 03/03/2016 0921   BUN 24 (H) 03/31/2021 0441   BUN 11 09/30/2019 0959   BUN 13.6 03/03/2016 0921   CREATININE 1.08 03/31/2021 0441   CREATININE 1.2 03/03/2016 0921   CALCIUM 8.0 (L) 03/31/2021 0441   CALCIUM 9.6 03/03/2016 0921   PROT 5.7 (L) 03/31/2021 0441   PROT 7.3 03/03/2016 0921   ALBUMIN 1.8 (L) 03/31/2021 0441   ALBUMIN 3.8 03/03/2016 0921    AST 20 03/31/2021 0441   AST 36 (H) 03/03/2016 0921   ALT 29 03/31/2021 0441   ALT 31 03/03/2016 0921   ALKPHOS 75 03/31/2021 0441   ALKPHOS 116 03/03/2016 0921   BILITOT 0.8 03/31/2021 0441   BILITOT 0.71 03/03/2016 0921   GFRNONAA >60 03/31/2021 0441   GFRNONAA 67 07/25/2013 1712   GFRAA >60 10/03/2019 0930   GFRAA 77 07/25/2013 1712   Lipase     Component Value Date/Time   LIPASE 22 02/19/2021 1653       Studies/Results: DG CHEST PORT 1 VIEW  Result Date: 03/29/2021 CLINICAL DATA:  Chest pain EXAM: PORTABLE CHEST 1 VIEW COMPARISON:  03/25/2021 FINDINGS: Right upper extremity PICC tip overlies the superior cavoatrial junction. Unchanged cardiomediastinal silhouette. There are faint patchy bilateral airspace opacities. There is no large pleural effusion or visible pneumothorax. No acute osseous abnormality. IMPRESSION: Faint patchy bilateral airspace opacities, concerning for developing of multifocal infectious/inflammatory process. Recommend radiographic follow-up. Electronically Signed   By: Maurine Simmering M.D.   On: 03/29/2021 13:55    Anti-infectives: Anti-infectives (From admission, onward)    Start     Dose/Rate Route Frequency Ordered Stop   03/30/21 1130  linezolid (ZYVOX) tablet 600 mg        600  mg Oral Every 12 hours 03/30/21 1043     03/09/21 0800  piperacillin-tazobactam (ZOSYN) IVPB 3.375 g        3.375 g 12.5 mL/hr over 240 Minutes Intravenous Every 8 hours 03/09/21 0706 03/25/21 2034   02/26/21 0100  doxycycline (VIBRAMYCIN) 100 mg in sodium chloride 0.9 % 250 mL IVPB        100 mg 125 mL/hr over 120 Minutes Intravenous 2 times daily 02/26/21 0005 03/08/21 2359   02/20/21 0300  piperacillin-tazobactam (ZOSYN) IVPB 3.375 g        3.375 g 12.5 mL/hr over 240 Minutes Intravenous Every 8 hours 02/19/21 1907 03/08/21 2359   02/19/21 1900  ceFEPIme (MAXIPIME) 2 g in sodium chloride 0.9 % 100 mL IVPB  Status:  Discontinued        2 g 200 mL/hr over 30 Minutes  Intravenous  Once 02/19/21 1857 02/19/21 1906   02/19/21 1900  metroNIDAZOLE (FLAGYL) IVPB 500 mg  Status:  Discontinued        500 mg 100 mL/hr over 60 Minutes Intravenous  Once 02/19/21 1857 02/19/21 1906   02/19/21 1815  piperacillin-tazobactam (ZOSYN) IVPB 3.375 g        3.375 g 100 mL/hr over 30 Minutes Intravenous  Once 02/19/21 1809 02/19/21 1944        Assessment/Plan POD33 s/p ex lap, sigmoid colectomy, end colostomy with enterorrhaphy and drainage of intraabdominal abscess - Dr. Kieth Brightly and Dr. Radene Knee 9/10 for Acute sigmoid diverticulitis with contained perforation and abscess - CT 9/16 demonstrates heterogenous left iliopsoas collection likely hematoma, stranding in the retroperitoneum and subcu tissue.  Distal colon appears decompressed and very small caliber but there is contrast entering this area; there is additionally a similarly small-caliber segment of colon in the proximal transverse and the ascending colon is not dilated- suspect over all this represents small bowel/gastric ileus.  IR does not recommend draining the hematoma at this point. ID following and recommending Zosyn through 10/17  - NGT replaced 9/27  - CT 9/27 with continued complex fluid collection along left psoas, SBO with transition in mid jejunum, post-surgical changes of colon resection with colostomy - Was started on reglan last weekend. Discussed with him the risk of tardive dyskinesia/EPS on 9/20. Patient stated understanding and is okay with continuing Reglan.  - Gastrograffin study 9/28 showed Stable gaseous distention of the small bowel, oral contrast progressed into the colon - CT 10/4 with ileus although improved distention of small bowel, stable psoas collection - tolerating soft diet but not taking in much - go back to full rate but cycle, will start appetite stimulant as well  - WOCN following for new ostomy teaching, daily WTD to midline wound  - Encouraged ambulation (working with therapies),  use IS, multimodal pain control  - Cont abx per ID recs   FEN - soft diet, ensure, go back to full rate TPN but cycle  VTE - SCDs, therapeutic lovenox  ID - Zosyn 9/4 - 10/12; PO Linezolid 10/12>> Foley - out, voiding. complained of some dysuria earlier this week - urine Cx with VRE, linezolid  Per primary: VRE UTI - linezolid per ID  A fib - cards following  AKI - resolved ABL anemia -  stable HTN ETOH use L knee pain - per ortho  LOS: 39 days    Norm Parcel, [facility] 03/31/2021, 10:19 AM Please see Amion for pager number during day hours 7:00am-4:30pm

## 2021-04-01 DIAGNOSIS — K5720 Diverticulitis of large intestine with perforation and abscess without bleeding: Secondary | ICD-10-CM | POA: Diagnosis not present

## 2021-04-01 LAB — BASIC METABOLIC PANEL
Anion gap: 6 (ref 5–15)
BUN: 25 mg/dL — ABNORMAL HIGH (ref 8–23)
CO2: 27 mmol/L (ref 22–32)
Calcium: 8 mg/dL — ABNORMAL LOW (ref 8.9–10.3)
Chloride: 98 mmol/L (ref 98–111)
Creatinine, Ser: 1.03 mg/dL (ref 0.61–1.24)
GFR, Estimated: >60 mL/min (ref >60)
Glucose, Bld: 146 mg/dL — ABNORMAL HIGH (ref 70–99)
Potassium: 3.9 mmol/L (ref 3.5–5.1)
Sodium: 131 mmol/L — ABNORMAL LOW (ref 135–145)

## 2021-04-01 LAB — GLUCOSE, CAPILLARY
Glucose-Capillary: 164 mg/dL — ABNORMAL HIGH (ref 70–99)
Glucose-Capillary: 124 mg/dL — ABNORMAL HIGH (ref 70–99)
Glucose-Capillary: 114 mg/dL — ABNORMAL HIGH (ref 70–99)

## 2021-04-01 LAB — OSMOLALITY: Osmolality: 285 mOsm/kg (ref 275–295)

## 2021-04-01 LAB — MAGNESIUM: Magnesium: 2.1 mg/dL (ref 1.7–2.4)

## 2021-04-01 LAB — PHOSPHORUS: Phosphorus: 3.3 mg/dL (ref 2.5–4.6)

## 2021-04-01 MED ORDER — TRACE MINERALS CU-MN-SE-ZN 300-55-60-3000 MCG/ML IV SOLN
INTRAVENOUS | Status: AC
  Filled 2021-04-01: qty 676

## 2021-04-01 MED ORDER — TRACE MINERALS CU-MN-SE-ZN 300-55-60-3000 MCG/ML IV SOLN: INTRAVENOUS | Status: DC

## 2021-04-01 MED ORDER — DRONABINOL 2.5 MG PO CAPS
5.0000 mg | ORAL_CAPSULE | Freq: Two times a day (BID) | ORAL | Status: DC
  Administered 2021-04-01 – 2021-04-06 (×11): 5 mg via ORAL
  Filled 2021-04-01 (×12): qty 2

## 2021-04-01 MED ORDER — APIXABAN 5 MG PO TABS
5.0000 mg | ORAL_TABLET | Freq: Two times a day (BID) | ORAL | Status: DC
  Administered 2021-04-01 – 2021-04-06 (×10): 5 mg via ORAL
  Filled 2021-04-01 (×10): qty 1

## 2021-04-01 MED ORDER — DILTIAZEM HCL 60 MG PO TABS
60.0000 mg | ORAL_TABLET | Freq: Four times a day (QID) | ORAL | Status: DC
  Administered 2021-04-01 – 2021-04-06 (×21): 60 mg via ORAL
  Filled 2021-04-01 (×21): qty 1

## 2021-04-01 MED ORDER — PANTOPRAZOLE SODIUM 40 MG PO TBEC
40.0000 mg | DELAYED_RELEASE_TABLET | Freq: Two times a day (BID) | ORAL | Status: DC
  Administered 2021-04-01 – 2021-04-06 (×11): 40 mg via ORAL
  Filled 2021-04-01 (×11): qty 1

## 2021-04-01 MED ORDER — HYDRALAZINE HCL 25 MG PO TABS: 25.0000 mg | ORAL_TABLET | Freq: Four times a day (QID) | ORAL | Status: DC | PRN

## 2021-04-01 MED ORDER — IBUPROFEN 200 MG PO TABS
400.0000 mg | ORAL_TABLET | Freq: Once | ORAL | Status: AC | PRN
  Administered 2021-04-01: 400 mg via ORAL
  Filled 2021-04-01: qty 2

## 2021-04-01 NOTE — Plan of Care (Signed)
  Problem: Clinical Measurements: Goal: Ability to maintain clinical measurements within normal limits will improve Outcome: Progressing Goal: Will remain free from infection Outcome: Progressing Goal: Respiratory complications will improve Outcome: Progressing   Problem: Elimination: Goal: Will not experience complications related to urinary retention Outcome: Progressing   Problem: Health Behavior/Discharge Planning: Goal: Ability to manage health-related needs will improve Outcome: Not Progressing   Problem: Nutrition: Goal: Adequate nutrition will be maintained Outcome: Not Progressing   Problem: Coping: Goal: Level of anxiety will decrease Outcome: Not Progressing

## 2021-04-01 NOTE — Discharge Instructions (Signed)
[facility]      [facility] °[phone] ° °OPEN ABDOMINAL SURGERY: POST OP INSTRUCTIONS ° °Always review your discharge instruction sheet given to you by the facility where your surgery was performed. ° °IF YOU HAVE DISABILITY OR FAMILY LEAVE FORMS, YOU MUST BRING THEM TO THE OFFICE FOR PROCESSING.  PLEASE DO NOT GIVE THEM TO YOUR DOCTOR. ° °A prescription for pain medication may be given to you upon discharge.  Take your pain medication as prescribed, if needed.  If narcotic pain medicine is not needed, then you may take acetaminophen (Tylenol) or ibuprofen (Advil) as needed. °Take your usually prescribed medications unless otherwise directed. °If you need a refill on your pain medication, please contact your pharmacy. They will contact our office to request authorization.  Prescriptions will not be filled after 5pm or on week-ends. °You should follow a light diet the first few days after arrival home, such as soup and crackers, pudding, etc.unless your doctor has advised otherwise. A high-fiber, low fat diet can be resumed as tolerated.   Be sure to include lots of fluids daily. Most patients will experience some swelling and bruising on the chest and neck area.  Ice packs will help.  Swelling and bruising can take several days to resolve °Most patients will experience some swelling and bruising in the area of the incision. Ice pack will help. Swelling and bruising can take several days to resolve..  °It is common to experience some constipation if taking pain medication after surgery.  Increasing fluid intake and taking a stool softener will usually help or prevent this problem from occurring.  A mild laxative (Milk of Magnesia or Miralax) should be taken according to package directions if there are no bowel movements after 48 hours. ° You may have steri-strips (small skin tapes) in place directly over the incision.  These strips should be left on the skin for 7-10 days.  If your surgeon used skin  glue on the incision, you may shower in 24 hours.  The glue will flake off over the next 2-3 weeks.  Any sutures or staples will be removed at the office during your follow-up visit. You may find that a light gauze bandage over your incision may keep your staples from being rubbed or pulled. You may shower and replace the bandage daily. °ACTIVITIES:  You may resume regular (light) daily activities beginning the next day--such as daily self-care, walking, climbing stairs--gradually increasing activities as tolerated.  You may have sexual intercourse when it is comfortable.  Refrain from any heavy lifting or straining until approved by your doctor. °You may drive when you no longer are taking prescription pain medication, you can comfortably wear a seatbelt, and you can safely maneuver your car and apply brakes ° °You should see your doctor in the office for a follow-up appointment approximately two weeks after your surgery.  Make sure that you call for this appointment within a day or two after you arrive home to insure a convenient appointment time. ° °WHEN TO CALL YOUR DOCTOR: °Fever over 101.0 °Inability to urinate °Nausea and/or vomiting °Extreme swelling or bruising °Continued bleeding from incision. °Increased pain, redness, or drainage from the incision. °Difficulty swallowing or breathing °Muscle cramping or spasms. °Numbness or tingling in hands or feet or around lips. ° °The clinic staff is available to answer your questions during regular business hours.  Please don’t hesitate to call and ask to speak to one of the nurses if you have concerns. ° °For   further questions, please visit [email or web address]  °

## 2021-04-01 NOTE — Progress Notes (Signed)
Progress Note  34 Days Post-Op  Subjective: Patient denies pain this AM. Working on PO intake but still reports poor appetite.   Objective: Vital signs in last 24 hours: Temp:  [98.4 F (36.9 C)-100.2 F (37.9 C)] 98.4 F (36.9 C) (10/14 0315) Pulse Rate:  [97-102] 100 (10/14 0315) Resp:  [19-20] 20 (10/14 0315) BP: (117-131)/(43-53) 130/53 (10/14 0315) SpO2:  [93 %-97 %] 93 % (10/14 0315) Weight:  [97.1 kg] 97.1 kg (10/14 0500) Last BM Date: 03/31/21  Intake/Output from previous day: 10/13 0701 - 10/14 0700 In: -  Out: 600 [Urine:600] Intake/Output this shift: No intake/output data recorded.  PE: Gen:  Alert, NAD, pleasant Pulm:  Normal rate and effort.  Abd: Soft, ND, NT, +BS. Stoma appears viable with gas/stool present in ostomy appliance. Midline dressing clean and intact    Lab Results:  No results for input(s): WBC, HGB, HCT, PLT in the last 72 hours. BMET Recent Labs    03/31/21 0441 04/01/21 0329  NA 129* 131*  K 4.0 3.9  CL 97* 98  CO2 24 27  GLUCOSE 143* 146*  BUN 24* 25*  CREATININE 1.08 1.03  CALCIUM 8.0* 8.0*   PT/INR No results for input(s): LABPROT, INR in the last 72 hours. CMP     Component Value Date/Time   NA 131 (L) 04/01/2021 0329   NA 134 09/30/2019 0959   NA 142 03/03/2016 0921   K 3.9 04/01/2021 0329   K 4.3 03/03/2016 0921   CL 98 04/01/2021 0329   CO2 27 04/01/2021 0329   CO2 25 03/03/2016 0921   GLUCOSE 146 (H) 04/01/2021 0329   GLUCOSE 89 03/03/2016 0921   BUN 25 (H) 04/01/2021 0329   BUN 11 09/30/2019 0959   BUN 13.6 03/03/2016 0921   CREATININE 1.03 04/01/2021 0329   CREATININE 1.2 03/03/2016 0921   CALCIUM 8.0 (L) 04/01/2021 0329   CALCIUM 9.6 03/03/2016 0921   PROT 5.7 (L) 03/31/2021 0441   PROT 7.3 03/03/2016 0921   ALBUMIN 1.8 (L) 03/31/2021 0441   ALBUMIN 3.8 03/03/2016 0921   AST 20 03/31/2021 0441   AST 36 (H) 03/03/2016 0921   ALT 29 03/31/2021 0441   ALT 31 03/03/2016 0921   ALKPHOS 75 03/31/2021  0441   ALKPHOS 116 03/03/2016 0921   BILITOT 0.8 03/31/2021 0441   BILITOT 0.71 03/03/2016 0921   GFRNONAA >60 04/01/2021 0329   GFRNONAA 67 07/25/2013 1712   GFRAA >60 10/03/2019 0930   GFRAA 77 07/25/2013 1712   Lipase     Component Value Date/Time   LIPASE 22 02/19/2021 1653       Studies/Results: No results found.  Anti-infectives: Anti-infectives (From admission, onward)    Start     Dose/Rate Route Frequency Ordered Stop   03/30/21 1130  linezolid (ZYVOX) tablet 600 mg        600 mg Oral Every 12 hours 03/30/21 1043 04/04/21 0959   03/09/21 0800  piperacillin-tazobactam (ZOSYN) IVPB 3.375 g        3.375 g 12.5 mL/hr over 240 Minutes Intravenous Every 8 hours 03/09/21 0706 03/25/21 2034   02/26/21 0100  doxycycline (VIBRAMYCIN) 100 mg in sodium chloride 0.9 % 250 mL IVPB        100 mg 125 mL/hr over 120 Minutes Intravenous 2 times daily 02/26/21 0005 03/08/21 2359   02/20/21 0300  piperacillin-tazobactam (ZOSYN) IVPB 3.375 g        3.375 g 12.5 mL/hr over 240 Minutes Intravenous Every  8 hours 02/19/21 1907 03/08/21 2359   02/19/21 1900  ceFEPIme (MAXIPIME) 2 g in sodium chloride 0.9 % 100 mL IVPB  Status:  Discontinued        2 g 200 mL/hr over 30 Minutes Intravenous  Once 02/19/21 1857 02/19/21 1906   02/19/21 1900  metroNIDAZOLE (FLAGYL) IVPB 500 mg  Status:  Discontinued        500 mg 100 mL/hr over 60 Minutes Intravenous  Once 02/19/21 1857 02/19/21 1906   02/19/21 1815  piperacillin-tazobactam (ZOSYN) IVPB 3.375 g        3.375 g 100 mL/hr over 30 Minutes Intravenous  Once 02/19/21 1809 02/19/21 1944        Assessment/Plan POD34 s/p ex lap, sigmoid colectomy, end colostomy with enterorrhaphy and drainage of intraabdominal abscess - Dr. Kieth Brightly and Dr. Radene Knee 9/10 for Acute sigmoid diverticulitis with contained perforation and abscess - CT 9/16 demonstrates heterogenous left iliopsoas collection likely hematoma, stranding in the retroperitoneum and subcu  tissue.  Distal colon appears decompressed and very small caliber but there is contrast entering this area; there is additionally a similarly small-caliber segment of colon in the proximal transverse and the ascending colon is not dilated- suspect over all this represents small bowel/gastric ileus.  IR does not recommend draining the hematoma at this point. ID following and recommending Zosyn through 10/17  - NGT replaced 9/27  - CT 9/27 with continued complex fluid collection along left psoas, SBO with transition in mid jejunum, post-surgical changes of colon resection with colostomy - Was started on reglan last weekend. Discussed with him the risk of tardive dyskinesia/EPS on 9/20. Patient stated understanding and is okay with continuing Reglan.  - Gastrograffin study 9/28 showed Stable gaseous distention of the small bowel, oral contrast progressed into the colon - CT 10/4 with ileus although improved distention of small bowel, stable psoas collection - tolerating soft diet but not taking in much - cont full rate but cycle, increased marinol - WOCN following for new ostomy teaching, daily WTD to midline wound  - Encouraged ambulation (working with therapies), use IS, multimodal pain control  - Cont abx per ID recs   FEN - soft diet, ensure, cyclic TPN VTE - SCDs, therapeutic lovenox  ID - Zosyn 9/4 - 10/12; PO Linezolid 10/12>>   Per primary: VRE UTI - linezolid per ID  A fib - cards following  AKI - resolved ABL anemia -  stable HTN ETOH use L knee pain - per ortho  LOS: 40 days    Norm Parcel, [facility] 04/01/2021, 7:56 AM Please see Amion for pager number during day hours 7:00am-4:30pm

## 2021-04-01 NOTE — Progress Notes (Addendum)
ANTICOAGULATION CONSULT NOTE - Follow Up Consult  Pharmacy Consult for Lovenox Indication: atrial fibrillation  Allergies  Allergen Reactions   Atorvastatin Other (See Comments)    myalgias    Patient Measurements: Height: 6' (182.9 cm) Weight: 97.1 kg (214 lb) IBW/kg (Calculated) : 77.6  Vital Signs: Temp: 98.1 F (36.7 C) (10/14 1130) Temp Source: Oral (10/14 1130) BP: 126/54 (10/14 1130) Pulse Rate: 95 (10/14 1130)  Labs: Recent Labs    03/31/21 0441 04/01/21 0329  CREATININE 1.08 1.03     Estimated Creatinine Clearance: 80.6 mL/min (by C-G formula based on SCr of 1.03 mg/dL).   Assessment: 70 y/o male admitted for sepsis from contained perforated diverticulitis and intra-abdominal abscess with active ileus. On PTA Xarelto for Afib with last dose on 9/2 at 2000.  Pharmacy consulted for Lovenox due to post operative ileus.   CT 9/27 with continued complex fluid collection along left psoas. Per IR note no aspiration/drainage recommended.   10/2: peak Anti-xa = 0.61 (~3h after AM dose) and therapeutic.  Pt has advanced to soft diet and is taking other medications PO however, diet is not consistent enough for Xarelto absorption at this time.  One alternative would be to consider apixaban which is less dependant on diet for absorption.  No bleeding noted, Hgb 7.9 on 10/10, platelets are stable.  Will continue to follow.  Goal of Therapy:  Anti-Xa level Goal:  0.6-1 units/mL 4 hours post dose Monitor platelets by anticoagulation protocol: Yes   Plan:  Continue Lovenox 100 mg SQ q12h CBC at least q72h while on Lovenox F/U ability to transition to oral agent   Thank you for involving pharmacy in this patient's care.  [employer], Pharm.D., BCPS Clinical Pharmacist  **Pharmacist phone directory can be found on [email or web address] listed under [place].  04/01/2021 12:47 PM   Addendum:  Communicated with Dr. Darrick Meigs who agreed to transition to Eliquis instead of  PTA Xarelto since Eliquis is not dependent on food for absorption.  Will order Eliquis 5mg  PO BID.  [employer], Pharm.D., BCPS  04/01/2021 12:55 PM

## 2021-04-01 NOTE — Progress Notes (Signed)
[facility]  PROGRESS NOTE  Travis Conrad FBP:[id number] DOB: 1951/06/16 DOA: 02/19/2021 PCP: [name], MD   Brief HPI:   70 year old male with medical history of atrial fibrillation, dyslipidemia, hypertension, diverticulosis presented with nausea, vomiting abdominal pain.  In the ED he was found to have BP of 84/65, heart rate 57, respiration 20/min, O2 sats 91%.  CT abdomen/pelvis showed acute sigmoid diverticulitis with small extraluminal gas and fluid collection in the sigmoid mesocolon, 3.5 x 1.5 x 2.7 cm consistent with perforation and abscess.  Dilated proximal and decompressed distal small bowel loops, possible ileus. 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  Developed postop ileus Urology and ID were consulted for epididymitis 9/19 developed postop anemia, required 1 unit PRBC Positive left knee pain, positive effusion, underwent left knee aspiration injection with good toleration, fluid showed no crystals, gram stain with no organisms 9/26, NG tube was removed 9/27 patient had vomiting abdominal distention, x-ray showed recurrent ileus, NG tube was replaced and connected to intermittent suction 10/6 patient started on clear liquid diet, diet is being advanced slowly    Subjective   Patient seen and examined, tolerating liquid diet well.   Assessment/Plan:     Severe sepsis -Secondary to perforated diverticulitis, s/p colectomy -Sepsis physiology has resolved -Left psoas collection, postop ileus -CT on 9/27 showed complex fluid collection along left iliopsoas, SBO with transition in mid jejunum -General surgery consulted IR for drainage of left psoas abscess, IR did not recommend  aspirating at this point of time -S/p Gastrografin study on 03/16/2021, patient being managed by intermittent NG tube clamping, remains on TPN -Antibiotics managed per ID, completed Zosyn on 10/7 -CT scan abdomen on 03/22/2021 showed ileus with improved distention of small bowel, stable psoas collection -Patient with persistent/recurrent ileus, managed by general surgery -NG tube was discontinued and patient advanced to full liquid diet -He is tolerating Ensure, 3 times a day -Also on TPN -Diet and oral intake has been improving, tolerating full liquid diet, to be advanced to soft diet -Continue TPN at half rate -Plan to wean off TPN and start p.o. diet as per surgery -Started on Marinol for appetite stimulation  Postop anemia -S/p 1 unit PRBC on 03/07/2021 -Transfuse for hemoglobin less than 7  Paroxysmal atrial fibrillation/hypertension -Patient was on amiodarone and metoprolol with good rate control -Continue enoxaparin for anticoagulation -Holding DOAC until p.o. more consistent -He is off Cardizem drip -Currently on p.o. Cardizem, amiodarone  VRE UTI -Urine culture growing vancomycin-resistant Enterococcus -Discussed with Dr. West Bali, she recommends starting Zyvox -Patient started on Zyvox  Tremors -Patient is a history of questionable benign essential tremor -Got worse yesterday while working with PT -Denies tremors this morning -Patient was taking sotalol at home which is currently on hold  Right epididymitis -Resolved -Completed therapy with doxycycline  Acute kidney injury/hypokalemia/hyponatremia/hypophosphatemia -Continue TPN per protocol -Electrolyte abnormalities have been resolved  Alcohol withdrawal -Resolved  Left knee arthritis -Clinically improved after steroid injection  Hypertension -Blood pressure is elevated -Continue Cardizem -We will start hydralazine as needed for BP more than 160/100  Acute hypoxemic respiratory failure -Significantly  improved -Continue oxygen 2 L/min -Patient is off steroids  Anasarca -Volume status improving with TPN IV Lasix -IV Lasix discontinued and started on p.o. Lasix 20 mg daily -Net -31  L  Hyponatremia -Sodium is 129 -Check serum osmolality   Scheduled medications:    (feeding supplement) PROSource Plus  30 mL Oral TID BM   acetaminophen  650 mg Oral Q6H   amiodarone  200 mg Oral Daily   apixaban  5 mg Oral BID   Chlorhexidine Gluconate Cloth  6 each Topical Daily   diltiazem  60 mg Oral Q6H   docusate sodium  100 mg Oral BID   dronabinol  5 mg Oral BID AC   feeding supplement  237 mL Oral TID BM   furosemide  20 mg Oral Daily   insulin aspart  0-9 Units Subcutaneous TID WC   lidocaine  1 patch Transdermal Q24H   linezolid  600 mg Oral Q12H   methocarbamol  1,000 mg Oral QID   metoCLOPramide (REGLAN) injection  10 mg Intravenous Q6H   nicotine  21 mg Transdermal Daily   pantoprazole  40 mg Oral BID   sodium chloride flush  10 mL Intracatheter Q12H     Data Reviewed:   CBG:  Recent Labs  Lab 03/31/21 0725 03/31/21 1123 03/31/21 1628 04/01/21 0737 04/01/21 1148  GLUCAP 135* 133* 133* 164* 124*    SpO2: 96 % O2 Flow Rate (L/min): 2 L/min    Vitals:   04/01/21 1100 04/01/21 1130 04/01/21 1200 04/01/21 1300  BP:  (!) 126/54 120/62 (!) 178/133  Pulse: 90 95 96 99  Resp: (!) 21 20 (!) 21 (!) 23  Temp:  98.1 F (36.7 C)    TempSrc:  Oral    SpO2: 95% 95% 96% 96%  Weight:      Height:         Intake/Output Summary (Last 24 hours) at 04/01/2021 1501 Last data filed at 04/01/2021 1300 Gross per 24 hour  Intake --  Output 1500 ml  Net -1500 ml    10/12 1901 - 10/14 0700 In: 634.5 [I.V.:634.5] Out: 2400 [Urine:2400]  Filed Weights   03/16/21 0343 03/28/21 0500 04/01/21 0500  Weight: 98.9 kg 93.6 kg 97.1 kg    Data Reviewed: Basic Metabolic Panel: Recent Labs  Lab 03/26/21 0450 03/27/21 0148 03/28/21 0504 03/29/21 0146 03/31/21 0441  04/01/21 0329  NA 134* 135 133* 132* 129* 131*  K 4.0 4.3 4.5 4.2 4.0 3.9  CL 101 101 102 100 97* 98  CO2 27 26 25 25 24 27   GLUCOSE 170* 141* 157* 126* 143* 146*  BUN 30* 31* 29* 28* 24* 25*  CREATININE 1.03 1.06 1.07 0.99 1.08 1.03  CALCIUM 7.7* 7.9* 8.1* 8.3* 8.0* 8.0*  MG 2.1  --  2.1 2.0 2.1 2.1  PHOS 3.4  --  3.5 4.0 4.1 3.3   Liver Function Tests: Recent Labs  Lab 03/28/21 0504 03/31/21 0441  AST 24 20  ALT 39 29  ALKPHOS 77 75  BILITOT 0.6 0.8  PROT 5.7* 5.7*  ALBUMIN 1.7* 1.8*   No results for input(s): LIPASE, AMYLASE in the last 168 hours. No results for input(s): AMMONIA in the last 168 hours. CBC: Recent Labs  Lab 03/26/21 0450 03/27/21 0148 03/28/21 0504  WBC 7.6 7.5 7.1  HGB 7.9* 7.8* 7.9*  HCT 25.5* 24.3* 25.2*  MCV 98.1 96.8 97.3  PLT 356 360 382   Cardiac Enzymes: No results for input(s): CKTOTAL, CKMB, CKMBINDEX, TROPONINI in the last 168 hours. BNP (last 3 results) No results for input(s): BNP in the last 8760 hours.  ProBNP (last 3 results) No results for input(s): PROBNP in  the last 8760 hours.  CBG: Recent Labs  Lab 03/31/21 0725 03/31/21 1123 03/31/21 1628 04/01/21 0737 04/01/21 1148  GLUCAP 135* 133* 133* 164* 124*       Radiology Reports  No results found.     Antibiotics: Anti-infectives (From admission, onward)    Start     Dose/Rate Route Frequency Ordered Stop   03/30/21 1130  linezolid (ZYVOX) tablet 600 mg        600 mg Oral Every 12 hours 03/30/21 1043 04/04/21 0959   03/09/21 0800  piperacillin-tazobactam (ZOSYN) IVPB 3.375 g        3.375 g 12.5 mL/hr over 240 Minutes Intravenous Every 8 hours 03/09/21 0706 03/25/21 2034   02/26/21 0100  doxycycline (VIBRAMYCIN) 100 mg in sodium chloride 0.9 % 250 mL IVPB        100 mg 125 mL/hr over 120 Minutes Intravenous 2 times daily 02/26/21 0005 03/08/21 2359   02/20/21 0300  piperacillin-tazobactam (ZOSYN) IVPB 3.375 g        3.375 g 12.5 mL/hr over 240 Minutes  Intravenous Every 8 hours 02/19/21 1907 03/08/21 2359   02/19/21 1900  ceFEPIme (MAXIPIME) 2 g in sodium chloride 0.9 % 100 mL IVPB  Status:  Discontinued        2 g 200 mL/hr over 30 Minutes Intravenous  Once 02/19/21 1857 02/19/21 1906   02/19/21 1900  metroNIDAZOLE (FLAGYL) IVPB 500 mg  Status:  Discontinued        500 mg 100 mL/hr over 60 Minutes Intravenous  Once 02/19/21 1857 02/19/21 1906   02/19/21 1815  piperacillin-tazobactam (ZOSYN) IVPB 3.375 g        3.375 g 100 mL/hr over 30 Minutes Intravenous  Once 02/19/21 1809 02/19/21 1944         DVT prophylaxis: Lovenox  Code Status: Full code  Family Communication: No family at bedside   Consultants:   Procedures:     Objective    Physical Examination:  General-appears in no acute distress Heart-S1-S2, regular, no murmur auscultated Lungs-clear to auscultation bilaterally, no wheezing or crackles auscultated Abdomen-soft, nontender, no organomegaly Extremities-no edema in the lower extremities Neuro-alert, oriented x3, no focal deficit noted  Status is: Inpatient  Dispo: The patient is from: Home              Anticipated d/c is to: [facility]              Anticipated d/c date is: 04/05/2021              Patient currently not stable for discharge  Barrier to discharge-ongoing treatment for UTI  COVID-19 Labs  No results for input(s): DDIMER, FERRITIN, LDH, CRP in the last 72 hours.  Lab Results  Component Value Date   [place] NEGATIVE 02/19/2021   [place] NEGATIVE 09/30/2019   [place] NEGATIVE 08/16/2019         Recent Results (from the past 240 hour(s))  Urine Culture     Status: Abnormal   Collection Time: 03/28/21  8:11 AM   Specimen: Urine, Clean Catch  Result Value Ref Range Status   Specimen Description URINE, CLEAN CATCH  Final   Special Requests   Final    NONE Performed at [facility], [place] [address], [place], [place] [address]    Culture (A)  Final    >=100,000  COLONIES/mL VANCOMYCIN RESISTANT ENTEROCOCCUS   Report Status 03/30/2021 FINAL  Final   Organism ID, Bacteria VANCOMYCIN RESISTANT ENTEROCOCCUS (A)  Final  Susceptibility   Vancomycin resistant enterococcus - MIC*    AMPICILLIN >=32 RESISTANT Resistant     NITROFURANTOIN 32 SENSITIVE Sensitive     VANCOMYCIN >=32 RESISTANT Resistant     LINEZOLID 2 SENSITIVE Sensitive     * >=100,000 COLONIES/mL VANCOMYCIN RESISTANT ENTEROCOCCUS    [place]   [facility] If 7PM-7AM, please contact night-coverage at [email or web address], Office  [phone]   04/01/2021, 3:01 PM  LOS: 40 days

## 2021-04-01 NOTE — Progress Notes (Signed)
PHARMACY - TOTAL PARENTERAL NUTRITION CONSULT NOTE  Indication: Prolonged ileus  Patient Measurements: Height: 6' (182.9 cm) Weight: 97.1 kg (214 lb) IBW/kg (Calculated) : 77.6 TPN AdjBW (KG): 82.6 Body mass index is 29.02 kg/m.  Assessment:  42 YOM with lower abdominal pain/N/V found to have perforated diverticulitis. CT abd on 9/3 found to have active ileus/pSBO from diverticulitis/bowel inflammation. Pharmacy consulted to manage TPN for prolonged ileus. Of note patient has a history pf alcohol dependency and is at high risk for refeeding.   Glucose / Insulin: no hx DM, A1c 5.9% - CBGs 130-160s. Off steroids 10/5. Utilized 2 units SSI in past 24 hrs + 30 units insulin in TPN Electrolytes: Na 131 (max in TPN), K 3.9 (goal >/=4), others WNL Renal: Scr 1.03 stable, BUN 25 stable. Lasix 20mg  PO daily Hepatic: LFTs / Tbili  WNL, TG 151, albumin 1.8  Intake / Output; MIVF: UOP 0.3 ml/kg/hr on Lasix, net -31.2L per charting GI Imaging: 9/3 CT abd: active ileus/pSBO - 9/16 CT abd: L-iliopsoas fluid collection (abscess or hematoma), resolution of prior diverticular abscess, still w/ evidence of ileus - 9/27 CT abd: continued retroperitoneal complex fluid collection, SBO - 10/4 CT: Left iliopsoas enlargement heterogeneity persists > may represent intramuscular hematoma, infection or mass.  Ieus, degree of small-bowel distention has decreased, pSBO cannot be excluded. - 10/7 KUB: Dilated loops of small bowel are slightly more prominent than on the prior study. Gas is noted within the colon. GI Surgeries / Procedures: 9/10 ex-lap, sigmoid colectomy w/ end colostomy creation, enterorrhaphy, drainage of intra-abd abscess, wound vac placement  Central access: PICC 02/24/21 TPN start date: 02/25/21  Nutritional Goals, RD Estimated Needs Total Energy Estimated Needs: 2000-2200 Total Protein Estimated Needs: 100-110 grams Total Fluid Estimated Needs: >2L  Current Nutrition:  TPN Ensure TID - 2 doses  charted yesterday Prosource TID - all doses charted yesterday Advanced to soft diet 10/12 - tolerating, minimal intake reported  Plan:  Continue cyclic concentrated TPN at goal per Surgery - will advance patient to 14-hr cycle at 1800 tonight. Plan to advance to 12-hr cycle tomorrow if tolerates and still needs goal TPN. Cycle 1560 ml over 14 hours - 60 ml/hr x 1 hr; then 120 ml/hr x 12 hrs; then 60 ml/hr x 1 hr TPN will provide 101 g AA, 304 g CHO and 62 g ILE for total of 2064 kCal, meeting 100% of patient's needs Electrolytes in TPN: Na 129mEq/L, increase K to 50 mEq/L, Ca 3 mEq/L, Mg 13 mEq/L; Phos 10 mmol/L, Cl:Ac 1:1  Add standard MVI, trace elements, folate, and thiamine to TPN Continue sensitive SSI TID with meals + 30 units regular insulin in TPN Standard TPN labs daily until at cycle goal then on Mon and Thurs F/U PO intake/diet advancement and ability to wean TPN   Arturo Morton, PharmD, BCPS Please check AMION for all [place] contact numbers Clinical Pharmacist 04/01/2021 7:36 AM

## 2021-04-01 NOTE — Progress Notes (Addendum)
This nurse had a discussion w/ pt concerning his calling out for assistance. Pt seems to be antagonizing staff with the number of times he's calling out for assistance. Pt has called out 15 plus times by 0300(multiple staff members have assisted him). Pt states he needs assistance with thermostat on multiple occassions, water, back rub, snacks, and help with other tasks that pt can do himself. I had a conversation with pt concerning his rehab and how he has been increasingly using his call bell for the past two nights. I also had a conversation with pt about trying to do more for himself if he is trying to be dced to go home/rehab. I reassured pt that we are here to help assist him in any way we can but it has to be a joint effort. I've personally cared for Travis Conrad on multiple occasions and he has been a pleasure to work with. He has come a long way. I will personally follow up with pt before shift is over.

## 2021-04-02 DIAGNOSIS — K5720 Diverticulitis of large intestine with perforation and abscess without bleeding: Secondary | ICD-10-CM | POA: Diagnosis not present

## 2021-04-02 DIAGNOSIS — I482 Chronic atrial fibrillation, unspecified: Secondary | ICD-10-CM | POA: Diagnosis not present

## 2021-04-02 DIAGNOSIS — N179 Acute kidney failure, unspecified: Secondary | ICD-10-CM | POA: Diagnosis not present

## 2021-04-02 DIAGNOSIS — I10 Essential (primary) hypertension: Secondary | ICD-10-CM | POA: Diagnosis not present

## 2021-04-02 LAB — BASIC METABOLIC PANEL
Anion gap: 7 (ref 5–15)
BUN: 27 mg/dL — ABNORMAL HIGH (ref 8–23)
CO2: 26 mmol/L (ref 22–32)
Calcium: 7.9 mg/dL — ABNORMAL LOW (ref 8.9–10.3)
Chloride: 103 mmol/L (ref 98–111)
Creatinine, Ser: 0.83 mg/dL (ref 0.61–1.24)
GFR, Estimated: >60 mL/min (ref >60)
Glucose, Bld: 138 mg/dL — ABNORMAL HIGH (ref 70–99)
Potassium: 4.3 mmol/L (ref 3.5–5.1)
Sodium: 136 mmol/L (ref 135–145)

## 2021-04-02 LAB — GLUCOSE, CAPILLARY
Glucose-Capillary: 141 mg/dL — ABNORMAL HIGH (ref 70–99)
Glucose-Capillary: 102 mg/dL — ABNORMAL HIGH (ref 70–99)
Glucose-Capillary: 130 mg/dL — ABNORMAL HIGH (ref 70–99)

## 2021-04-02 LAB — PHOSPHORUS: Phosphorus: 3.5 mg/dL (ref 2.5–4.6)

## 2021-04-02 LAB — MAGNESIUM: Magnesium: 2.2 mg/dL (ref 1.7–2.4)

## 2021-04-02 MED ORDER — TRACE MINERALS CU-MN-SE-ZN 300-55-60-3000 MCG/ML IV SOLN
INTRAVENOUS | Status: AC
  Filled 2021-04-02: qty 676

## 2021-04-02 NOTE — Progress Notes (Signed)
PHARMACY - TOTAL PARENTERAL NUTRITION CONSULT NOTE  Indication: Prolonged ileus  Patient Measurements: Height: 6' (182.9 cm) Weight: 95.3 kg (210 lb) IBW/kg (Calculated) : 77.6 TPN AdjBW (KG): 82.6 Body mass index is 28.48 kg/m.  Assessment:  73 YOM with lower abdominal pain/N/V found to have perforated diverticulitis. CT abd on 9/3 found to have active ileus/pSBO from diverticulitis/bowel inflammation. Pharmacy consulted to manage TPN for prolonged ileus. Of note patient has a history pf alcohol dependency and is at high risk for refeeding.   Glucose / Insulin: no hx DM, A1c 5.9% - CBGs controlled. Off steroids 10/5. Utilized 3 units SSI in past 24 hrs + 30 units insulin in TPN Electrolytes: Na 131>136 (max in TPN), others WNL Renal: Scr down to 0.83, BUN 27 stable. Lasix 20mg  PO daily Hepatic: LFTs / Tbili  WNL, TG 151, albumin 1.8  Intake / Output; MIVF: UOP 0.5 ml/kg/hr on Lasix, net -31.2L per charting GI Imaging: 9/3 CT abd: active ileus/pSBO - 9/16 CT abd: L-iliopsoas fluid collection (abscess or hematoma), resolution of prior diverticular abscess, still w/ evidence of ileus - 9/27 CT abd: continued retroperitoneal complex fluid collection, SBO - 10/4 CT: Left iliopsoas enlargement heterogeneity persists > may represent intramuscular hematoma, infection or mass.  Ieus, degree of small-bowel distention has decreased, pSBO cannot be excluded. - 10/7 KUB: Dilated loops of small bowel are slightly more prominent than on the prior study. Gas is noted within the colon. GI Surgeries / Procedures: 9/10 ex-lap, sigmoid colectomy w/ end colostomy creation, enterorrhaphy, drainage of intra-abd abscess, wound vac placement  Central access: PICC 02/24/21 TPN start date: 02/25/21  Nutritional Goals, RD Estimated Needs Total Energy Estimated Needs: 2000-2200 Total Protein Estimated Needs: 100-110 grams Total Fluid Estimated Needs: >2L  Current Nutrition:  TPN Ensure TID - 2 doses charted  yesterday Prosource TID - all doses charted yesterday Advanced to soft diet 10/12 - tolerating, minimal intake reported  Plan:  Continue cyclic concentrated TPN at goal per Surgery. Plan to advance to 12-hr cycle tomorrow if tolerates and still needs goal TPN. Cycle 1560 ml over 14 hours - 60 ml/hr x 1 hr; then 120 ml/hr x 12 hrs; then 60 ml/hr x 1 hr TPN will provide 101 g AA, 304 g CHO and 62 g ILE for total of 2064 kCal, meeting 100% of patient's needs Electrolytes in TPN: reduce Na to 127mEq/L, K to 45 mEq/L, Ca 3 mEq/L, Mg 13 mEq/L; Phos 10 mmol/L, Cl:Ac 1:1  Add standard MVI, trace elements, folate, and thiamine to TPN Continue sensitive SSI TID with meals + 30 units regular insulin in TPN Standard TPN labs daily until at cycle goal then on Mon and Thurs F/U PO intake/diet advancement and ability to wean TPN - hopefully soon per Surgery   Arturo Morton, PharmD, BCPS Please check AMION for all [place] contact numbers Clinical Pharmacist 04/02/2021 7:26 AM

## 2021-04-02 NOTE — Progress Notes (Signed)
Patient has exhausted PRN medications at this time Patient continues to call demanding more medications to help him sleep and for pain Patient has been treated PRN as ordered Patient educated on use of PRN medications and reasoning regarding frequency, dose, and what medication is for   04/02/21 2325  Assess: MEWS Score  Temp 98.9 F (37.2 C)  BP (!) 136/57  Pulse Rate 96  ECG Heart Rate 96  Resp (!) 26  SpO2 99 %  Assess: MEWS Score  MEWS Temp 0  MEWS Systolic 0  MEWS Pulse 0  MEWS RR 2  MEWS LOC 0  MEWS Score 2  MEWS Score Color Yellow  Assess: if the MEWS score is Yellow or Red  Were vital signs taken at a resting state? Yes  Focused Assessment No change from prior assessment  Early Detection of Sepsis Score *See Row Information* Low  MEWS guidelines implemented *See Row Information* No, previously yellow, continue vital signs every 4 hours  Treat  MEWS Interventions Administered prn meds/treatments;Administered scheduled meds/treatments  Take Vital Signs  Increase Vital Sign Frequency   (maintain VS as they are assessed every 4 hours)  Document  Patient Outcome  (stable)  Progress note created (see row info) Yes

## 2021-04-02 NOTE — Progress Notes (Signed)
Progress Note  35 Days Post-Op  Subjective: NAEO. Tolerating some soft food and 2-3 protein shakes daily. His cc is headache in the evening and poor sleep.   Objective: Vital signs in last 24 hours: Temp:  [98.1 F (36.7 C)-98.9 F (37.2 C)] 98.6 F (37 C) (10/15 0427) Pulse Rate:  [88-99] 90 (10/15 0427) Resp:  [16-24] 20 (10/15 0427) BP: (113-178)/(47-133) 113/51 (10/15 0427) SpO2:  [94 %-99 %] 96 % (10/15 0427) Weight:  [95.3 kg] 95.3 kg (10/15 0500) Last BM Date: 03/31/21  Intake/Output from previous day: 10/14 0701 - 10/15 0700 In: 500 [P.O.:500] Out: 1600 [Urine:1200; Stool:400] Intake/Output this shift: No intake/output data recorded.  PE: Gen:  Alert, NAD, pleasant Pulm:  Normal rate and effort.  Abd: Soft, ND, NT, +BS. Stoma appears viable with gas/stool present in ostomy appliance. Midline dressing clean and intact - just changed.    Lab Results:  No results for input(s): WBC, HGB, HCT, PLT in the last 72 hours. BMET Recent Labs    03/31/21 0441 04/01/21 0329  NA 129* 131*  K 4.0 3.9  CL 97* 98  CO2 24 27  GLUCOSE 143* 146*  BUN 24* 25*  CREATININE 1.08 1.03  CALCIUM 8.0* 8.0*   PT/INR No results for input(s): LABPROT, INR in the last 72 hours. CMP     Component Value Date/Time   NA 131 (L) 04/01/2021 0329   NA 134 09/30/2019 0959   NA 142 03/03/2016 0921   K 3.9 04/01/2021 0329   K 4.3 03/03/2016 0921   CL 98 04/01/2021 0329   CO2 27 04/01/2021 0329   CO2 25 03/03/2016 0921   GLUCOSE 146 (H) 04/01/2021 0329   GLUCOSE 89 03/03/2016 0921   BUN 25 (H) 04/01/2021 0329   BUN 11 09/30/2019 0959   BUN 13.6 03/03/2016 0921   CREATININE 1.03 04/01/2021 0329   CREATININE 1.2 03/03/2016 0921   CALCIUM 8.0 (L) 04/01/2021 0329   CALCIUM 9.6 03/03/2016 0921   PROT 5.7 (L) 03/31/2021 0441   PROT 7.3 03/03/2016 0921   ALBUMIN 1.8 (L) 03/31/2021 0441   ALBUMIN 3.8 03/03/2016 0921   AST 20 03/31/2021 0441   AST 36 (H) 03/03/2016 0921   ALT 29  03/31/2021 0441   ALT 31 03/03/2016 0921   ALKPHOS 75 03/31/2021 0441   ALKPHOS 116 03/03/2016 0921   BILITOT 0.8 03/31/2021 0441   BILITOT 0.71 03/03/2016 0921   GFRNONAA >60 04/01/2021 0329   GFRNONAA 67 07/25/2013 1712   GFRAA >60 10/03/2019 0930   GFRAA 77 07/25/2013 1712   Lipase     Component Value Date/Time   LIPASE 22 02/19/2021 1653       Studies/Results: No results found.  Anti-infectives: Anti-infectives (From admission, onward)    Start     Dose/Rate Route Frequency Ordered Stop   03/30/21 1130  linezolid (ZYVOX) tablet 600 mg        600 mg Oral Every 12 hours 03/30/21 1043 04/04/21 0959   03/09/21 0800  piperacillin-tazobactam (ZOSYN) IVPB 3.375 g        3.375 g 12.5 mL/hr over 240 Minutes Intravenous Every 8 hours 03/09/21 0706 03/25/21 2034   02/26/21 0100  doxycycline (VIBRAMYCIN) 100 mg in sodium chloride 0.9 % 250 mL IVPB        100 mg 125 mL/hr over 120 Minutes Intravenous 2 times daily 02/26/21 0005 03/08/21 2359   02/20/21 0300  piperacillin-tazobactam (ZOSYN) IVPB 3.375 g  3.375 g 12.5 mL/hr over 240 Minutes Intravenous Every 8 hours 02/19/21 1907 03/08/21 2359   02/19/21 1900  ceFEPIme (MAXIPIME) 2 g in sodium chloride 0.9 % 100 mL IVPB  Status:  Discontinued        2 g 200 mL/hr over 30 Minutes Intravenous  Once 02/19/21 1857 02/19/21 1906   02/19/21 1900  metroNIDAZOLE (FLAGYL) IVPB 500 mg  Status:  Discontinued        500 mg 100 mL/hr over 60 Minutes Intravenous  Once 02/19/21 1857 02/19/21 1906   02/19/21 1815  piperacillin-tazobactam (ZOSYN) IVPB 3.375 g        3.375 g 100 mL/hr over 30 Minutes Intravenous  Once 02/19/21 1809 02/19/21 1944        Assessment/Plan POD35 s/p ex lap, sigmoid colectomy, end colostomy with enterorrhaphy and drainage of intraabdominal abscess - Dr. Kieth Brightly and Dr. Radene Knee 9/10 for Acute sigmoid diverticulitis with contained perforation and abscess - CT 9/16 demonstrates heterogenous left iliopsoas  collection likely hematoma, stranding in the retroperitoneum and subcu tissue.  Distal colon appears decompressed and very small caliber but there is contrast entering this area; there is additionally a similarly small-caliber segment of colon in the proximal transverse and the ascending colon is not dilated- suspect over all this represents small bowel/gastric ileus.  IR does not recommend draining the hematoma at this point. ID following and recommending abx through 10/17  - NGT replaced 9/27  - CT 9/27 with continued complex fluid collection along left psoas, SBO with transition in mid jejunum, post-surgical changes of colon resection with colostomy - Was started on reglan last weekend. Discussed with him the risk of tardive dyskinesia/EPS on 9/20. Patient stated understanding and is okay with continuing Reglan.  - Gastrograffin study 9/28 showed Stable gaseous distention of the small bowel, oral contrast progressed into the colon - CT 10/4 with ileus although improved distention of small bowel, stable psoas collection - tolerating soft diet but not taking in much - cont full rate but cycle, increased marinol 10/14. May be able to wean TPN soon if continues to do well with PO. - WOCN following for new ostomy teaching, daily WTD to midline wound  - Encouraged ambulation (working with therapies), use IS, multimodal pain control  - Cont abx per ID recs   FEN - soft diet, ensure, cyclic TPN VTE - SCDs, therapeutic lovenox  ID - Zosyn 9/4 - 10/12; PO Linezolid 10/12>> ; stop date 10/17   Per primary: VRE UTI - linezolid per ID  A fib - cards following  AKI - resolved ABL anemia -  stable HTN ETOH use L knee pain - per ortho  LOS: 41 days    Jill Alexanders, [facility] Surgery 04/02/2021, 7:50 AM Please see Amion for pager number during day hours 7:00am-4:30pm

## 2021-04-02 NOTE — Plan of Care (Signed)
Pt pleasantly confused at times reports difficulty sleeping overnight. Ostomy with flatus and stool. Appetite has been improving. No nausea or pain today. Bathed per wife. Hair washed. Up to chair this afternoon. UO is diminished.    Problem: Health Behavior/Discharge Planning: Goal: Ability to manage health-related needs will improve Outcome: Progressing   Problem: Clinical Measurements: Goal: Ability to maintain clinical measurements within normal limits will improve Outcome: Progressing Goal: Will remain free from infection Outcome: Progressing Goal: Diagnostic test results will improve Outcome: Progressing Goal: Respiratory complications will improve Outcome: Progressing Goal: Cardiovascular complication will be avoided Outcome: Progressing   Problem: Activity: Goal: Risk for activity intolerance will decrease Outcome: Progressing   Problem: Nutrition: Goal: Adequate nutrition will be maintained Outcome: Progressing   Problem: Coping: Goal: Level of anxiety will decrease Outcome: Progressing   Problem: Elimination: Goal: Will not experience complications related to bowel motility Outcome: Progressing Goal: Will not experience complications related to urinary retention Outcome: Progressing   Problem: Pain Managment: Goal: General experience of comfort will improve Outcome: Progressing   Problem: Safety: Goal: Ability to remain free from injury will improve Outcome: Progressing   Problem: Skin Integrity: Goal: Risk for impaired skin integrity will decrease Outcome: Progressing   Problem: Education: Goal: Knowledge of disease or condition will improve Outcome: Progressing Goal: Understanding of medication regimen will improve Outcome: Progressing Goal: Individualized Educational Video(s) Outcome: Progressing   Problem: Activity: Goal: Ability to tolerate increased activity will improve Outcome: Progressing   Problem: Cardiac: Goal: Ability to achieve and  maintain adequate cardiopulmonary perfusion will improve Outcome: Progressing   Problem: Health Behavior/Discharge Planning: Goal: Ability to safely manage health-related needs after discharge will improve Outcome: Progressing   Problem: Education: Goal: Required Educational Video(s) Outcome: Progressing   Problem: Clinical Measurements: Goal: Ability to maintain clinical measurements within normal limits will improve Outcome: Progressing Goal: Postoperative complications will be avoided or minimized Outcome: Progressing   Problem: Skin Integrity: Goal: Demonstration of wound healing without infection will improve Outcome: Progressing

## 2021-04-02 NOTE — Progress Notes (Signed)
[facility]  PROGRESS NOTE  LONG BRIMAGE MPN:[id number] DOB: 06-24-1950 DOA: 02/19/2021 PCP: [name], MD   Brief HPI:   70 year old male with medical history of atrial fibrillation, dyslipidemia, hypertension, diverticulosis presented with nausea, vomiting abdominal pain.  In the ED he was found to have BP of 84/65, heart rate 57, respiration 20/min, O2 sats 91%.  CT abdomen/pelvis showed acute sigmoid diverticulitis with small extraluminal gas and fluid collection in the sigmoid mesocolon, 3.5 x 1.5 x 2.7 cm consistent with perforation and abscess.  Dilated proximal and decompressed distal small bowel loops, possible ileus. 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  Developed postop ileus Urology and ID were consulted for epididymitis 9/19 developed postop anemia, required 1 unit PRBC Positive left knee pain, positive effusion, underwent left knee aspiration injection with good toleration, fluid showed no crystals, gram stain with no organisms 9/26, NG tube was removed 9/27 patient had vomiting abdominal distention, x-ray showed recurrent ileus, NG tube was replaced and connected to intermittent suction 10/6 patient started on clear liquid diet, diet is being advanced slowly    Subjective   Patient seen and examined, denies new complaints.  Tolerating p.o. diet well.   Assessment/Plan:     Severe sepsis -Secondary to perforated diverticulitis, s/p colectomy -Sepsis physiology has resolved -Left psoas collection, postop ileus -CT on 9/27 showed complex fluid collection along left iliopsoas, SBO with transition in mid jejunum -General surgery consulted IR for drainage of left psoas abscess,  IR did not recommend aspirating at this point of time -S/p Gastrografin study on 03/16/2021, patient being managed by intermittent NG tube clamping, remains on TPN -Antibiotics managed per ID, completed Zosyn on 10/7 -CT scan abdomen on 03/22/2021 showed ileus with improved distention of small bowel, stable psoas collection -Patient with persistent/recurrent ileus, managed by general surgery -NG tube was discontinued and patient advanced to full liquid diet -He is tolerating Ensure, 3 times a day -Also on TPN -Diet and oral intake has been improving, tolerating full liquid diet, to be advanced to soft diet -Continue TPN at half rate -Plan to wean off TPN and start p.o. diet as per surgery -Started on Marinol for appetite stimulation  Postop anemia -S/p 1 unit PRBC on 03/07/2021 -Transfuse for hemoglobin less than 7  Paroxysmal atrial fibrillation/hypertension -Patient was on amiodarone and metoprolol with good rate control -Continue apixaban for anticoagulation -He is off Cardizem drip -Currently on p.o. Cardizem, amiodarone  VRE UTI -Urine culture growing vancomycin-resistant Enterococcus -Discussed with Dr. West Bali, she recommends starting Zyvox -Patient started on Zyvox; stop date 04/04/2021  Tremors -Patient is a history of questionable benign essential tremor -Got worse yesterday while working with PT -Denies tremors this morning -Patient was taking sotalol at home which is currently on hold  Right epididymitis -Resolved -Completed therapy with doxycycline  Acute kidney injury/hypokalemia/hyponatremia/hypophosphatemia -Continue TPN per protocol -Electrolyte abnormalities have been resolved  Alcohol withdrawal -Resolved  Left knee arthritis -Clinically improved after steroid injection  Hypertension -Blood pressure is elevated -Continue Cardizem -We will start hydralazine as needed for BP more than 160/100  Acute hypoxemic respiratory failure -Significantly  improved -Continue oxygen 2 L/min -Patient is off steroids  Anasarca -Volume status improving with TPN IV Lasix -IV Lasix discontinued and started on p.o. Lasix 20 mg daily -Net -  31 L  Hyponatremia -Resolved   Scheduled medications:    (feeding supplement) PROSource Plus  30 mL Oral TID BM   acetaminophen  650 mg Oral Q6H   amiodarone  200 mg Oral Daily   apixaban  5 mg Oral BID   Chlorhexidine Gluconate Cloth  6 each Topical Daily   diltiazem  60 mg Oral Q6H   docusate sodium  100 mg Oral BID   dronabinol  5 mg Oral BID AC   feeding supplement  237 mL Oral TID BM   furosemide  20 mg Oral Daily   insulin aspart  0-9 Units Subcutaneous TID WC   lidocaine  1 patch Transdermal Q24H   linezolid  600 mg Oral Q12H   methocarbamol  1,000 mg Oral QID   metoCLOPramide (REGLAN) injection  10 mg Intravenous Q6H   nicotine  21 mg Transdermal Daily   pantoprazole  40 mg Oral BID   sodium chloride flush  10 mL Intracatheter Q12H     Data Reviewed:   CBG:  Recent Labs  Lab 04/01/21 0737 04/01/21 1148 04/01/21 1626 04/02/21 0742 04/02/21 1102  GLUCAP 164* 124* 114* 141* 102*    SpO2: 99 % O2 Flow Rate (L/min): 2 L/min    Vitals:   04/02/21 0427 04/02/21 0500 04/02/21 1105 04/02/21 1503  BP: (!) 113/51  (!) 116/57 (!) 121/52  Pulse: 90  80 87  Resp: 20  20 (!) 22  Temp: 98.6 F (37 C)  98 F (36.7 C) 98.7 F (37.1 C)  TempSrc: Oral  Oral Oral  SpO2: 96%  98% 99%  Weight:  95.3 kg    Height:         Intake/Output Summary (Last 24 hours) at 04/02/2021 1523 Last data filed at 04/02/2021 5852 Gross per 24 hour  Intake 510 ml  Output 400 ml  Net 110 ml    10/13 1901 - 10/15 0700 In: 500 [P.O.:500] Out: 2200 [Urine:1800]  Filed Weights   03/28/21 0500 04/01/21 0500 04/02/21 0500  Weight: 93.6 kg 97.1 kg 95.3 kg    Data Reviewed: Basic Metabolic Panel: Recent Labs  Lab 03/28/21 0504 03/29/21 0146 03/31/21 0441 04/01/21 0329 04/02/21 0500  NA 133*  132* 129* 131* 136  K 4.5 4.2 4.0 3.9 4.3  CL 102 100 97* 98 103  CO2 25 25 24 27 26   GLUCOSE 157* 126* 143* 146* 138*  BUN 29* 28* 24* 25* 27*  CREATININE 1.07 0.99 1.08 1.03 0.83  CALCIUM 8.1* 8.3* 8.0* 8.0* 7.9*  MG 2.1 2.0 2.1 2.1 2.2  PHOS 3.5 4.0 4.1 3.3 3.5   Liver Function Tests: Recent Labs  Lab 03/28/21 0504 03/31/21 0441  AST 24 20  ALT 39 29  ALKPHOS 77 75  BILITOT 0.6 0.8  PROT 5.7* 5.7*  ALBUMIN 1.7* 1.8*   No results for input(s): LIPASE, AMYLASE in the last 168 hours. No results for input(s): AMMONIA in the last 168 hours. CBC: Recent Labs  Lab 03/27/21 0148 03/28/21 0504  WBC 7.5 7.1  HGB 7.8* 7.9*  HCT 24.3* 25.2*  MCV 96.8 97.3  PLT 360 382   Cardiac Enzymes: No results for input(s): CKTOTAL, CKMB, CKMBINDEX, TROPONINI in the last 168 hours. BNP (last 3 results) No results for input(s): BNP in the last 8760 hours.  ProBNP (last 3 results) No results for input(s): PROBNP in the last 8760 hours.  CBG: Recent Labs  Lab 04/01/21 0737 04/01/21 1148 04/01/21 1626 04/02/21 0742 04/02/21 1102  [place]       Radiology Reports  No results found.     Antibiotics: Anti-infectives (From admission, onward)    Start     Dose/Rate Route Frequency Ordered Stop   03/30/21 1130  linezolid (ZYVOX) tablet 600 mg        600 mg Oral Every 12 hours 03/30/21 1043 04/04/21 0959   03/09/21 0800  piperacillin-tazobactam (ZOSYN) IVPB 3.375 g        3.375 g 12.5 mL/hr over 240 Minutes Intravenous Every 8 hours 03/09/21 0706 03/25/21 2034   02/26/21 0100  doxycycline (VIBRAMYCIN) 100 mg in sodium chloride 0.9 % 250 mL IVPB        100 mg 125 mL/hr over 120 Minutes Intravenous 2 times daily 02/26/21 0005 03/08/21 2359   02/20/21 0300  piperacillin-tazobactam (ZOSYN) IVPB 3.375 g        3.375 g 12.5 mL/hr over 240 Minutes Intravenous Every 8 hours 02/19/21 1907 03/08/21 2359   02/19/21 1900  ceFEPIme (MAXIPIME) 2 g in sodium  chloride 0.9 % 100 mL IVPB  Status:  Discontinued        2 g 200 mL/hr over 30 Minutes Intravenous  Once 02/19/21 1857 02/19/21 1906   02/19/21 1900  metroNIDAZOLE (FLAGYL) IVPB 500 mg  Status:  Discontinued        500 mg 100 mL/hr over 60 Minutes Intravenous  Once 02/19/21 1857 02/19/21 1906   02/19/21 1815  piperacillin-tazobactam (ZOSYN) IVPB 3.375 g        3.375 g 100 mL/hr over 30 Minutes Intravenous  Once 02/19/21 1809 02/19/21 1944         DVT prophylaxis: Lovenox  Code Status: Full code  Family Communication: No family at bedside   Consultants:   Procedures:     Objective    Physical Examination:  General-appears in no acute distress Heart-S1-S2, regular, no murmur auscultated Lungs-clear to auscultation bilaterally, no wheezing or crackles auscultated Abdomen-soft, nontender, no organomegaly Extremities-no edema in the lower extremities Neuro-alert, oriented x3, no focal deficit noted  Status is: Inpatient  Dispo: The patient is from: Home              Anticipated d/c is to: [facility]              Anticipated d/c date is: 04/05/2021              Patient currently not stable for discharge  Barrier to discharge-ongoing treatment for UTI  COVID-19 Labs  No results for input(s): DDIMER, FERRITIN, LDH, CRP in the last 72 hours.  Lab Results  Component Value Date   [place] NEGATIVE 02/19/2021   [place] NEGATIVE 09/30/2019   [place] NEGATIVE 08/16/2019         Recent Results (from the past 240 hour(s))  Urine Culture     Status: Abnormal   Collection Time: 03/28/21  8:11 AM   Specimen: Urine, Clean Catch  Result Value Ref Range Status   Specimen Description URINE, CLEAN CATCH  Final   Special Requests   Final    NONE Performed at [facility], [place] [address], [place], [place] [address]    Culture (A)  Final    >=100,000 COLONIES/mL VANCOMYCIN RESISTANT ENTEROCOCCUS   Report Status 03/30/2021 FINAL  Final   Organism ID,  Bacteria VANCOMYCIN RESISTANT ENTEROCOCCUS (A)  Final      Susceptibility   Vancomycin resistant enterococcus - MIC*    AMPICILLIN >=32 RESISTANT Resistant  NITROFURANTOIN 32 SENSITIVE Sensitive     VANCOMYCIN >=32 RESISTANT Resistant     LINEZOLID 2 SENSITIVE Sensitive     * >=100,000 COLONIES/mL VANCOMYCIN RESISTANT ENTEROCOCCUS    [place]   [facility] If 7PM-7AM, please contact night-coverage at [email or web address], Office  [phone]   04/02/2021, 3:23 PM  LOS: 41 days

## 2021-04-03 DIAGNOSIS — I482 Chronic atrial fibrillation, unspecified: Secondary | ICD-10-CM | POA: Diagnosis not present

## 2021-04-03 DIAGNOSIS — I10 Essential (primary) hypertension: Secondary | ICD-10-CM | POA: Diagnosis not present

## 2021-04-03 DIAGNOSIS — N179 Acute kidney failure, unspecified: Secondary | ICD-10-CM | POA: Diagnosis not present

## 2021-04-03 DIAGNOSIS — K5720 Diverticulitis of large intestine with perforation and abscess without bleeding: Secondary | ICD-10-CM | POA: Diagnosis not present

## 2021-04-03 LAB — GLUCOSE, CAPILLARY
Glucose-Capillary: 106 mg/dL — ABNORMAL HIGH (ref 70–99)
Glucose-Capillary: 101 mg/dL — ABNORMAL HIGH (ref 70–99)
Glucose-Capillary: 147 mg/dL — ABNORMAL HIGH (ref 70–99)
Glucose-Capillary: 102 mg/dL — ABNORMAL HIGH (ref 70–99)
Glucose-Capillary: 127 mg/dL — ABNORMAL HIGH (ref 70–99)

## 2021-04-03 LAB — COMPREHENSIVE METABOLIC PANEL
ALT: 22 U/L (ref 0–44)
AST: 17 U/L (ref 15–41)
Albumin: 1.8 g/dL — ABNORMAL LOW (ref 3.5–5.0)
Alkaline Phosphatase: 65 U/L (ref 38–126)
Anion gap: 7 (ref 5–15)
BUN: 26 mg/dL — ABNORMAL HIGH (ref 8–23)
CO2: 24 mmol/L (ref 22–32)
Calcium: 8 mg/dL — ABNORMAL LOW (ref 8.9–10.3)
Chloride: 98 mmol/L (ref 98–111)
Creatinine, Ser: 0.81 mg/dL (ref 0.61–1.24)
GFR, Estimated: >60 mL/min (ref >60)
Glucose, Bld: 100 mg/dL — ABNORMAL HIGH (ref 70–99)
Potassium: 4.5 mmol/L (ref 3.5–5.1)
Sodium: 129 mmol/L — ABNORMAL LOW (ref 135–145)
Total Bilirubin: 0.3 mg/dL (ref 0.3–1.2)
Total Protein: 5.7 g/dL — ABNORMAL LOW (ref 6.5–8.1)

## 2021-04-03 LAB — PHOSPHORUS: Phosphorus: 3.6 mg/dL (ref 2.5–4.6)

## 2021-04-03 LAB — MAGNESIUM: Magnesium: 2.1 mg/dL (ref 1.7–2.4)

## 2021-04-03 MED ORDER — TRACE MINERALS CU-MN-SE-ZN 300-55-60-3000 MCG/ML IV SOLN
INTRAVENOUS | Status: AC
  Filled 2021-04-03: qty 676.87

## 2021-04-03 NOTE — Progress Notes (Signed)
PHARMACY - TOTAL PARENTERAL NUTRITION CONSULT NOTE  Indication: Prolonged ileus  Patient Measurements: Height: 6' (182.9 cm) Weight: 91.2 kg (201 lb) IBW/kg (Calculated) : 77.6 TPN AdjBW (KG): 82.6 Body mass index is 27.26 kg/m.  Assessment:  6 YOM with lower abdominal pain/N/V found to have perforated diverticulitis. CT abd on 9/3 found to have active ileus/pSBO from diverticulitis/bowel inflammation. Pharmacy consulted to manage TPN for prolonged ileus. Of note patient has a history pf alcohol dependency and is at high risk for refeeding.   Glucose / Insulin: no hx DM, A1c 5.9% - CBGs controlled, running in low 100s. Off steroids 10/5. Utilized 2 units SSI in past 24 hrs + 30 units insulin in TPN Electrolytes: Na 136>129, others WNL Renal: Scr down to 0.81, BUN 26 stable. Lasix 20mg  PO daily Hepatic: LFTs / Tbili  WNL, TG 151, albumin 1.8  Intake / Output; MIVF: UOP 1 ml/kg/hr on Lasix, net -29.3L per charting; 10/15 GI Imaging: - 9/3 CT abd: active ileus/pSBO - 9/16 CT abd: L-iliopsoas fluid collection (abscess or hematoma), resolution of prior diverticular abscess, still w/ evidence of ileus - 9/27 CT abd: continued retroperitoneal complex fluid collection, SBO - 10/4 CT: Left iliopsoas enlargement heterogeneity persists > may represent intramuscular hematoma, infection or mass.  Ieus, degree of small-bowel distention has decreased, pSBO cannot be excluded. - 10/7 KUB: Dilated loops of small bowel are slightly more prominent than on the prior study. Gas is noted within the colon. GI Surgeries / Procedures: - 9/10 ex-lap, sigmoid colectomy w/ end colostomy creation, enterorrhaphy, drainage of intra-abd abscess, wound vac placement  Central access: PICC 02/24/21 TPN start date: 02/25/21  Nutritional Goals, RD Estimated Needs Total Energy Estimated Needs: 2000-2200 Total Protein Estimated Needs: 100-110 grams Total Fluid Estimated Needs: >2L  Current Nutrition:  TPN Ensure TID - 1  dose charted yesterday Prosource TID - 2 doses charted yesterday Advanced to soft diet 10/12 - tolerating, minimal intake reported  Plan:  Advance concentrated TPN to 12-hr cycle, as patient appears to be tolerating 14-hr cycle. Cycle 1652 ml over 12 hours - 71 ml/hr x 1 hr; then 142 ml/hr x 10 hrs; then 71 ml/hr x 1 hr TPN will provide 102 g AA, 305 g CHO and 62 g ILE for total of 2065 kCal, meeting 100% of patient's needs Electrolytes in TPN: increase Na to 140 mEq/L, reduce K to 40 mEq/L, Ca 3 mEq/L, Mg 13 mEq/L, Phos 10 mmol/L; Cl:Ac 1:1  Add standard MVI, trace elements, folate, and thiamine to TPN Continue sensitive SSI TID with meals + reduce to 25 units regular insulin in TPN Standard TPN labs daily until at cycle goal then on Mon and Thurs F/U PO intake/diet advancement and ability to wean TPN - hopefully soon per Surgery. Initiating calorie count 10/16   Arturo Morton, PharmD, BCPS Please check AMION for all [place] contact numbers Clinical Pharmacist 04/03/2021 7:34 AM

## 2021-04-03 NOTE — Progress Notes (Signed)
Dressing to midline abdominal incision changed

## 2021-04-03 NOTE — Progress Notes (Signed)
Morning CMP, Magnesium, and Phosphorus sent to lab via tube station.

## 2021-04-03 NOTE — Progress Notes (Signed)
Progress Note  36 Days Post-Op  Subjective: Travis Conrad. Tolerating some soft food and states he ate a little more yesterday. Having ostomy output. Reports better sleep last night  Objective: Vital signs in last 24 hours: Temp:  [97.7 F (36.5 C)-98.9 F (37.2 C)] 98.6 F (37 C) (10/16 0735) Pulse Rate:  [80-100] 88 (10/16 0735) Resp:  [20-26] 22 (10/16 0735) BP: (110-137)/(52-87) 110/87 (10/16 0735) SpO2:  [97 %-99 %] 97 % (10/16 0735) Weight:  [91.2 kg] 91.2 kg (10/16 0409) Last BM Date: 04/02/21  Intake/Output from previous day: 10/15 0701 - 10/16 0700 In: 2080.5 [P.O.:840; I.V.:1240.5] Out: 2400 [Urine:2400] Intake/Output this shift: No intake/output data recorded.  PE: Gen:  Alert, NAD, pleasant Pulm:  Normal rate and effort.  Abd: Soft, ND, NT, +BS. Stoma appears viable with gas/stool present in ostomy appliance. Midline wound >90% pink granulation tissue, scant fibrinous exudate over central aspect. No purulence or drainage. No cellulitis.   Lab Results:  No results for input(s): WBC, HGB, HCT, PLT in the last 72 hours. BMET Recent Labs    04/02/21 0500 04/03/21 0500  NA 136 129*  K 4.3 4.5  CL 103 98  CO2 26 24  GLUCOSE 138* 100*  BUN 27* 26*  CREATININE 0.83 0.81  CALCIUM 7.9* 8.0*   PT/INR No results for input(s): LABPROT, INR in the last 72 hours. CMP     Component Value Date/Time   NA 129 (L) 04/03/2021 0500   NA 134 09/30/2019 0959   NA 142 03/03/2016 0921   K 4.5 04/03/2021 0500   K 4.3 03/03/2016 0921   CL 98 04/03/2021 0500   CO2 24 04/03/2021 0500   CO2 25 03/03/2016 0921   GLUCOSE 100 (H) 04/03/2021 0500   GLUCOSE 89 03/03/2016 0921   BUN 26 (H) 04/03/2021 0500   BUN 11 09/30/2019 0959   BUN 13.6 03/03/2016 0921   CREATININE 0.81 04/03/2021 0500   CREATININE 1.2 03/03/2016 0921   CALCIUM 8.0 (L) 04/03/2021 0500   CALCIUM 9.6 03/03/2016 0921   PROT 5.7 (L) 04/03/2021 0500   PROT 7.3 03/03/2016 0921   ALBUMIN 1.8 (L) 04/03/2021 0500    ALBUMIN 3.8 03/03/2016 0921   AST 17 04/03/2021 0500   AST 36 (H) 03/03/2016 0921   ALT 22 04/03/2021 0500   ALT 31 03/03/2016 0921   ALKPHOS 65 04/03/2021 0500   ALKPHOS 116 03/03/2016 0921   BILITOT 0.3 04/03/2021 0500   BILITOT 0.71 03/03/2016 0921   GFRNONAA >60 04/03/2021 0500   GFRNONAA 67 07/25/2013 1712   GFRAA >60 10/03/2019 0930   GFRAA 77 07/25/2013 1712   Lipase     Component Value Date/Time   LIPASE 22 02/19/2021 1653       Studies/Results: No results found.  Anti-infectives: Anti-infectives (From admission, onward)    Start     Dose/Rate Route Frequency Ordered Stop   03/30/21 1130  linezolid (ZYVOX) tablet 600 mg        600 mg Oral Every 12 hours 03/30/21 1043 04/04/21 0959   03/09/21 0800  piperacillin-tazobactam (ZOSYN) IVPB 3.375 g        3.375 g 12.5 mL/hr over 240 Minutes Intravenous Every 8 hours 03/09/21 0706 03/25/21 2034   02/26/21 0100  doxycycline (VIBRAMYCIN) 100 mg in sodium chloride 0.9 % 250 mL IVPB        100 mg 125 mL/hr over 120 Minutes Intravenous 2 times daily 02/26/21 0005 03/08/21 2359   02/20/21 0300  piperacillin-tazobactam (ZOSYN)  IVPB 3.375 g        3.375 g 12.5 mL/hr over 240 Minutes Intravenous Every 8 hours 02/19/21 1907 03/08/21 2359   02/19/21 1900  ceFEPIme (MAXIPIME) 2 g in sodium chloride 0.9 % 100 mL IVPB  Status:  Discontinued        2 g 200 mL/hr over 30 Minutes Intravenous  Once 02/19/21 1857 02/19/21 1906   02/19/21 1900  metroNIDAZOLE (FLAGYL) IVPB 500 mg  Status:  Discontinued        500 mg 100 mL/hr over 60 Minutes Intravenous  Once 02/19/21 1857 02/19/21 1906   02/19/21 1815  piperacillin-tazobactam (ZOSYN) IVPB 3.375 g        3.375 g 100 mL/hr over 30 Minutes Intravenous  Once 02/19/21 1809 02/19/21 1944        Assessment/Plan POD35 s/p ex lap, sigmoid colectomy, end colostomy with enterorrhaphy and drainage of intraabdominal abscess - Dr. Kieth Brightly and Dr. Radene Knee 9/10 for Acute sigmoid diverticulitis  with contained perforation and abscess - CT 9/16 demonstrates heterogenous left iliopsoas collection likely hematoma, stranding in the retroperitoneum and subcu tissue.  Distal colon appears decompressed and very small caliber but there is contrast entering this area; there is additionally a similarly small-caliber segment of colon in the proximal transverse and the ascending colon is not dilated- suspect over all this represents small bowel/gastric ileus.  IR does not recommend draining the hematoma at this point. ID following and recommending abx through 10/17  - NGT replaced 9/27  - CT 9/27 with continued complex fluid collection along left psoas, SBO with transition in mid jejunum, post-surgical changes of colon resection with colostomy - Was started on reglan last weekend. Discussed with him the risk of tardive dyskinesia/EPS on 9/20. Patient stated understanding and is okay with continuing Reglan.  - Gastrograffin study 9/28 showed Stable gaseous distention of the small bowel, oral contrast progressed into the colon - CT 10/4 with ileus although improved distention of small bowel, stable psoas collection - tolerating soft diet but not taking in much - cont full rate but cycle, increased marinol 10/14. May be able to wean TPN soon if continues to do well with PO. Calorie count ordered starting today 10/16.  - WOCN following for new ostomy teaching, daily WTD to midline wound  - Encouraged ambulation (working with therapies), use IS, multimodal pain control  - Cont abx per ID recs   FEN - soft diet, ensure, cyclic TPN VTE - SCDs, therapeutic lovenox  ID - Zosyn 9/4 - 10/12; PO Linezolid 10/12>> ; stop date 10/17   Per primary: VRE UTI - linezolid per ID  A fib - cards following  AKI - resolved ABL anemia -  stable HTN ETOH use L knee pain - per ortho  LOS: 42 days    Jill Alexanders, [facility] Surgery 04/03/2021, 9:02 AM Please see Amion for pager number during day  hours 7:00am-4:30pm

## 2021-04-03 NOTE — Progress Notes (Signed)
Assigned nurse had a thorough discussion w/ pt concerning his calling out for assistance. Pt seems to be antagonizing staff with the number of times he's calling out for assistance. Pt has called out nearly 15 times by 0000(multiple staff members have assisted him). All staff members ensure no further assistance is needed before leaving patient to ensure his needs are met. Pt states he needs assistance with moving his pillow, rubbing lotion on his arms, finding the remote in which he use to call out, adjusting the thermostat multiple times, water even through water at the bedside is fresh and ice remains, pain/sleep medications that he was educated about and provided next dose times and dosages, and assistance with other tasks that patient can do independently. Patient educated and advised about his need for participation in his care, importance of completing tasks independently especially if able, and his lack of rehabilitation and participation in self-care that has been proven to prolong hospital admission by EBP. Patient reassured and advised that staff members are here to assist him as needed, but he is expected to progress and participate in care as tolerated. Calls and assistance will continue to be provided when required and patient will be educated throughout shift. I will continue to monitor.

## 2021-04-03 NOTE — Progress Notes (Signed)
[facility]  PROGRESS NOTE  Travis Conrad OEV:[id number] DOB: [birth year] DOA: 02/19/2021 PCP: [name], MD   Brief HPI:   70 year old male with medical history of atrial fibrillation, dyslipidemia, hypertension, diverticulosis presented with nausea, vomiting abdominal pain.  In the ED he was found to have BP of 84/65, heart rate 57, respiration 20/min, O2 sats 91%.  CT abdomen/pelvis showed acute sigmoid diverticulitis with small extraluminal gas and fluid collection in the sigmoid mesocolon, 3.5 x 1.5 x 2.7 cm consistent with perforation and abscess.  Dilated proximal and decompressed distal small bowel loops, possible ileus. 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  Developed postop ileus Urology and ID were consulted for epididymitis 9/19 developed postop anemia, required 1 unit PRBC Positive left knee pain, positive effusion, underwent left knee aspiration injection with good toleration, fluid showed no crystals, gram stain with no organisms 9/26, NG tube was removed 9/27 patient had vomiting abdominal distention, x-ray showed recurrent ileus, NG tube was replaced and connected to intermittent suction 10/6 patient started on clear liquid diet, diet is being advanced slowly    Subjective   Patient seen and examined, denies any complaints.  Tolerating p.o. diet well.   Assessment/Plan:     Severe sepsis -Secondary to perforated diverticulitis, s/p colectomy -Sepsis physiology has resolved -Left psoas collection, postop ileus -CT on 9/27 showed complex fluid collection along left iliopsoas, SBO with transition in mid jejunum -General surgery consulted IR for drainage of left psoas abscess,  IR did not recommend aspirating at this point of time -S/p Gastrografin study on 03/16/2021, patient being managed by intermittent NG tube clamping, remains on TPN -Antibiotics managed per ID, completed Zosyn on 10/7 -CT scan abdomen on 03/22/2021 showed ileus with improved distention of small bowel, stable psoas collection -Patient with persistent/recurrent ileus, managed by general surgery -NG tube was discontinued and patient advanced to full liquid diet -He is tolerating Ensure, 3 times a day -Also on TPN -Diet and oral intake has been improving, tolerating full liquid diet, to be advanced to soft diet -Continue TPN at half rate -Plan to wean off TPN and start p.o. diet as per surgery -Started on Marinol for appetite stimulation  Postop anemia -S/p 1 unit PRBC on 03/07/2021 -Transfuse for hemoglobin less than 7 -Check CBC in a.m.  Paroxysmal atrial fibrillation/hypertension -Patient was on amiodarone and metoprolol with good rate control -Continue apixaban for anticoagulation -He is off Cardizem drip -Currently on p.o. Cardizem, amiodarone  VRE UTI -Urine culture growing vancomycin-resistant Enterococcus -Discussed with Dr. West Bali, she recommends starting Zyvox -Patient started on Zyvox; stop date 04/04/2021  Tremors -Patient is a history of questionable benign essential tremor -Got worse yesterday while working with PT -Denies tremors this morning -Patient was taking sotalol at home which is currently on hold  Right epididymitis -Resolved -Completed therapy with doxycycline  Acute kidney injury/hypokalemia/hyponatremia/hypophosphatemia -Continue TPN per protocol -Electrolyte abnormalities have been resolved  Alcohol withdrawal -Resolved  Left knee arthritis -Clinically improved after steroid injection  Hypertension -Blood pressure is elevated -Continue Cardizem -We will start hydralazine as needed for BP more than 160/100  Acute hypoxemic respiratory  failure -Significantly improved -Continue oxygen 2 L/min -Patient is off steroids  Anasarca -Volume status improving with TPN IV Lasix -IV Lasix discontinued and started on p.o. Lasix  20 mg daily -Net -31 L  Hyponatremia -Likely hypervolemic from fluid shifts from TPN -We will monitor serum sodium   Scheduled medications:    (feeding supplement) PROSource Plus  30 mL Oral TID BM   acetaminophen  650 mg Oral Q6H   amiodarone  200 mg Oral Daily   apixaban  5 mg Oral BID   Chlorhexidine Gluconate Cloth  6 each Topical Daily   diltiazem  60 mg Oral Q6H   docusate sodium  100 mg Oral BID   dronabinol  5 mg Oral BID AC   feeding supplement  237 mL Oral TID BM   furosemide  20 mg Oral Daily   insulin aspart  0-9 Units Subcutaneous TID WC   lidocaine  1 patch Transdermal Q24H   linezolid  600 mg Oral Q12H   methocarbamol  1,000 mg Oral QID   metoCLOPramide (REGLAN) injection  10 mg Intravenous Q6H   nicotine  21 mg Transdermal Daily   pantoprazole  40 mg Oral BID   sodium chloride flush  10 mL Intracatheter Q12H     Data Reviewed:   CBG:  Recent Labs  Lab 04/02/21 0742 04/02/21 1102 04/02/21 1602 04/03/21 0542 04/03/21 0814  GLUCAP 141* 102* 130* 106* 101*    SpO2: 97 % O2 Flow Rate (L/min): 2 L/min    Vitals:   04/02/21 2325 04/03/21 0403 04/03/21 0409 04/03/21 0735  BP: (!) 136/57 137/65  110/87  Pulse: 96 100  88  Resp: (!) 26 (!) 23  (!) 22  Temp: 98.9 F (37.2 C) 98.2 F (36.8 C)  98.6 F (37 C)  TempSrc: Oral Oral  Oral  SpO2: 99% 98%  97%  Weight:   91.2 kg   Height:         Intake/Output Summary (Last 24 hours) at 04/03/2021 1041 Last data filed at 04/03/2021 0700 Gross per 24 hour  Intake 2070.46 ml  Output 2400 ml  Net -329.54 ml    10/14 1901 - 10/16 0700 In: 2080.5 [P.O.:840; I.V.:1240.5] Out: 2400 [Urine:2400]  Filed Weights   04/01/21 0500 04/02/21 0500 04/03/21 0409  Weight: 97.1 kg 95.3 kg 91.2 kg    Data Reviewed: Basic  Metabolic Panel: Recent Labs  Lab 03/29/21 0146 03/31/21 0441 04/01/21 0329 04/02/21 0500 04/03/21 0500  NA 132* 129* 131* 136 129*  K 4.2 4.0 3.9 4.3 4.5  CL 100 97* 98 103 98  CO2 25 24 27 26 24   GLUCOSE 126* 143* 146* 138* 100*  BUN 28* 24* 25* 27* 26*  CREATININE 0.99 1.08 1.03 0.83 0.81  CALCIUM 8.3* 8.0* 8.0* 7.9* 8.0*  MG 2.0 2.1 2.1 2.2 2.1  PHOS 4.0 4.1 3.3 3.5 3.6   Liver Function Tests: Recent Labs  Lab 03/28/21 0504 03/31/21 0441 04/03/21 0500  AST 24 20 17   ALT 39 29 22  ALKPHOS 77 75 65  BILITOT 0.6 0.8 0.3  PROT 5.7* 5.7* 5.7*  ALBUMIN 1.7* 1.8* 1.8*   No results for input(s): LIPASE, AMYLASE in the last 168 hours. No results for input(s): AMMONIA in the last 168 hours. CBC: Recent Labs  Lab 03/28/21 0504  WBC 7.1  HGB 7.9*  HCT 25.2*  MCV 97.3  PLT 382   Cardiac Enzymes: No results for input(s): CKTOTAL, CKMB, CKMBINDEX, TROPONINI in the last 168 hours. BNP (last 3 results) No results for input(s): BNP in the last 8760 hours.  ProBNP (last 3 results) No results for input(s): PROBNP in the last 8760  hours.  CBG: Recent Labs  Lab 04/02/21 0742 04/02/21 1102 04/02/21 1602 04/03/21 0542 04/03/21 0814  GLUCAP 141* 102* 130* 106* 101*       Radiology Reports  No results found.     Antibiotics: Anti-infectives (From admission, onward)    Start     Dose/Rate Route Frequency Ordered Stop   03/30/21 1130  linezolid (ZYVOX) tablet 600 mg        600 mg Oral Every 12 hours 03/30/21 1043 04/04/21 0959   03/09/21 0800  piperacillin-tazobactam (ZOSYN) IVPB 3.375 g        3.375 g 12.5 mL/hr over 240 Minutes Intravenous Every 8 hours 03/09/21 0706 03/25/21 2034   02/26/21 0100  doxycycline (VIBRAMYCIN) 100 mg in sodium chloride 0.9 % 250 mL IVPB        100 mg 125 mL/hr over 120 Minutes Intravenous 2 times daily 02/26/21 0005 03/08/21 2359   02/20/21 0300  piperacillin-tazobactam (ZOSYN) IVPB 3.375 g        3.375 g 12.5 mL/hr over 240  Minutes Intravenous Every 8 hours 02/19/21 1907 03/08/21 2359   02/19/21 1900  ceFEPIme (MAXIPIME) 2 g in sodium chloride 0.9 % 100 mL IVPB  Status:  Discontinued        2 g 200 mL/hr over 30 Minutes Intravenous  Once 02/19/21 1857 02/19/21 1906   02/19/21 1900  metroNIDAZOLE (FLAGYL) IVPB 500 mg  Status:  Discontinued        500 mg 100 mL/hr over 60 Minutes Intravenous  Once 02/19/21 1857 02/19/21 1906   02/19/21 1815  piperacillin-tazobactam (ZOSYN) IVPB 3.375 g        3.375 g 100 mL/hr over 30 Minutes Intravenous  Once 02/19/21 1809 02/19/21 1944         DVT prophylaxis: Lovenox  Code Status: Full code  Family Communication: No family at bedside   Consultants:   Procedures:     Objective    Physical Examination:  General-appears in no acute distress Heart-S1-S2, regular, no murmur auscultated Lungs-clear to auscultation bilaterally, no wheezing or crackles auscultated Abdomen-soft, nontender, colostomy in place, no organomegaly Extremities-no edema in the lower extremities Neuro-alert, oriented x3, no focal deficit noted  Status is: Inpatient  Dispo: The patient is from: Home              Anticipated d/c is to: [facility]              Anticipated d/c date is: 04/05/2021              Patient currently not stable for discharge  Barrier to discharge-ongoing treatment for UTI  COVID-19 Labs  No results for input(s): DDIMER, FERRITIN, LDH, CRP in the last 72 hours.  Lab Results  Component Value Date   [place] NEGATIVE 02/19/2021   [place] NEGATIVE 09/30/2019   [place] NEGATIVE 08/16/2019         Recent Results (from the past 240 hour(s))  Urine Culture     Status: Abnormal   Collection Time: 03/28/21  8:11 AM   Specimen: Urine, Clean Catch  Result Value Ref Range Status   Specimen Description URINE, CLEAN CATCH  Final   Special Requests   Final    NONE Performed at [facility], [place] [address] [place], [place] [address]    Culture  (A)  Final    >=100,000 COLONIES/mL VANCOMYCIN RESISTANT ENTEROCOCCUS   Report Status 03/30/2021 FINAL  Final   Organism ID, Bacteria VANCOMYCIN RESISTANT ENTEROCOCCUS (A)  Final  Susceptibility   Vancomycin resistant enterococcus - MIC*    AMPICILLIN >=32 RESISTANT Resistant     NITROFURANTOIN 32 SENSITIVE Sensitive     VANCOMYCIN >=32 RESISTANT Resistant     LINEZOLID 2 SENSITIVE Sensitive     * >=100,000 COLONIES/mL VANCOMYCIN RESISTANT ENTEROCOCCUS    [place]   [facility] If 7PM-7AM, please contact night-coverage at [email or web address], Office  [phone]   04/03/2021, 10:41 AM  LOS: 42 days

## 2021-04-04 DIAGNOSIS — I10 Essential (primary) hypertension: Secondary | ICD-10-CM | POA: Diagnosis not present

## 2021-04-04 DIAGNOSIS — N179 Acute kidney failure, unspecified: Secondary | ICD-10-CM | POA: Diagnosis not present

## 2021-04-04 DIAGNOSIS — I482 Chronic atrial fibrillation, unspecified: Secondary | ICD-10-CM | POA: Diagnosis not present

## 2021-04-04 DIAGNOSIS — K5720 Diverticulitis of large intestine with perforation and abscess without bleeding: Secondary | ICD-10-CM | POA: Diagnosis not present

## 2021-04-04 LAB — CBC
HCT: 23.1 % — ABNORMAL LOW (ref 39.0–52.0)
Hemoglobin: 7 g/dL — ABNORMAL LOW (ref 13.0–17.0)
MCH: 28.8 pg (ref 26.0–34.0)
MCHC: 30.3 g/dL (ref 30.0–36.0)
MCV: 95.1 fL (ref 80.0–100.0)
Platelets: 405 10*3/uL — ABNORMAL HIGH (ref 150–400)
RBC: 2.43 MIL/uL — ABNORMAL LOW (ref 4.22–5.81)
RDW: 15.3 % (ref 11.5–15.5)
WBC: 8.5 10*3/uL (ref 4.0–10.5)
nRBC: 0 % (ref 0.0–0.2)

## 2021-04-04 LAB — COMPREHENSIVE METABOLIC PANEL
ALT: 21 U/L (ref 0–44)
AST: 14 U/L — ABNORMAL LOW (ref 15–41)
Albumin: 1.7 g/dL — ABNORMAL LOW (ref 3.5–5.0)
Alkaline Phosphatase: 65 U/L (ref 38–126)
Anion gap: 5 (ref 5–15)
BUN: 25 mg/dL — ABNORMAL HIGH (ref 8–23)
CO2: 26 mmol/L (ref 22–32)
Calcium: 8 mg/dL — ABNORMAL LOW (ref 8.9–10.3)
Chloride: 97 mmol/L — ABNORMAL LOW (ref 98–111)
Creatinine, Ser: 0.82 mg/dL (ref 0.61–1.24)
GFR, Estimated: >60 mL/min (ref >60)
Glucose, Bld: 174 mg/dL — ABNORMAL HIGH (ref 70–99)
Potassium: 4.2 mmol/L (ref 3.5–5.1)
Sodium: 128 mmol/L — ABNORMAL LOW (ref 135–145)
Total Bilirubin: 0.3 mg/dL (ref 0.3–1.2)
Total Protein: 5.4 g/dL — ABNORMAL LOW (ref 6.5–8.1)

## 2021-04-04 LAB — MAGNESIUM: Magnesium: 1.8 mg/dL (ref 1.7–2.4)

## 2021-04-04 LAB — GLUCOSE, CAPILLARY
Glucose-Capillary: 118 mg/dL — ABNORMAL HIGH (ref 70–99)
Glucose-Capillary: 107 mg/dL — ABNORMAL HIGH (ref 70–99)
Glucose-Capillary: 114 mg/dL — ABNORMAL HIGH (ref 70–99)
Glucose-Capillary: 115 mg/dL — ABNORMAL HIGH (ref 70–99)

## 2021-04-04 LAB — PREPARE RBC (CROSSMATCH)

## 2021-04-04 LAB — TRIGLYCERIDES: Triglycerides: 159 mg/dL — ABNORMAL HIGH (ref <150)

## 2021-04-04 LAB — PHOSPHORUS: Phosphorus: 3.5 mg/dL (ref 2.5–4.6)

## 2021-04-04 MED ORDER — HYDROCORTISONE 1 % EX CREA
TOPICAL_CREAM | Freq: Two times a day (BID) | CUTANEOUS | Status: DC
  Filled 2021-04-04: qty 28

## 2021-04-04 MED ORDER — SODIUM CHLORIDE 0.9% IV SOLUTION: Freq: Once | INTRAVENOUS | Status: AC

## 2021-04-04 MED ORDER — TRACE MINERALS CU-MN-SE-ZN 300-55-60-3000 MCG/ML IV SOLN
INTRAVENOUS | Status: AC
  Filled 2021-04-04: qty 676.87

## 2021-04-04 NOTE — Progress Notes (Signed)
MD on-called paged and notified of pt hgb of 7.0. no response from MD yet. RN awaiting on response or new orders from MD. Delia Heady RN

## 2021-04-04 NOTE — Progress Notes (Signed)
Progress Note  37 Days Post-Op  Subjective: NAEO. Still not eating much.  Having bowel function via ostomy.  Not sleeping well.  Objective: Vital signs in last 24 hours: Temp:  [98.1 F (36.7 C)-98.8 F (37.1 C)] 98.1 F (36.7 C) (10/17 0800) Pulse Rate:  [88-105] 88 (10/17 0800) Resp:  [16-18] 16 (10/17 0800) BP: (116-138)/(51-59) 116/51 (10/17 0800) SpO2:  [96 %-98 %] 96 % (10/17 0800) Weight:  [93.4 kg] 93.4 kg (10/17 0500) Last BM Date: 04/03/21  Intake/Output from previous day: 10/16 0701 - 10/17 0700 In: 1489.4 [I.V.:1489.4] Out: 3800 [Urine:3800] Intake/Output this shift: Total I/O In: 10 [I.V.:10] Out: 650 [Urine:650]  PE: Gen:  Alert, NAD, pleasant Pulm:  Normal rate and effort.  Abd: Soft, ND, NT, +BS. Stoma appears viable with gas/stool present in ostomy appliance. Midline dressing clean and intact - just changed.    Lab Results:  Recent Labs    04/04/21 0423  WBC 8.5  HGB 7.0*  HCT 23.1*  PLT 405*   BMET Recent Labs    04/03/21 0500 04/04/21 0423  NA 129* 128*  K 4.5 4.2  CL 98 97*  CO2 24 26  GLUCOSE 100* 174*  BUN 26* 25*  CREATININE 0.81 0.82  CALCIUM 8.0* 8.0*    PT/INR No results for input(s): LABPROT, INR in the last 72 hours. CMP     Component Value Date/Time   NA 128 (L) 04/04/2021 0423   NA 134 09/30/2019 0959   NA 142 03/03/2016 0921   K 4.2 04/04/2021 0423   K 4.3 03/03/2016 0921   CL 97 (L) 04/04/2021 0423   CO2 26 04/04/2021 0423   CO2 25 03/03/2016 0921   GLUCOSE 174 (H) 04/04/2021 0423   GLUCOSE 89 03/03/2016 0921   BUN 25 (H) 04/04/2021 0423   BUN 11 09/30/2019 0959   BUN 13.6 03/03/2016 0921   CREATININE 0.82 04/04/2021 0423   CREATININE 1.2 03/03/2016 0921   CALCIUM 8.0 (L) 04/04/2021 0423   CALCIUM 9.6 03/03/2016 0921   PROT 5.4 (L) 04/04/2021 0423   PROT 7.3 03/03/2016 0921   ALBUMIN 1.7 (L) 04/04/2021 0423   ALBUMIN 3.8 03/03/2016 0921   AST 14 (L) 04/04/2021 0423   AST 36 (H) 03/03/2016 0921    ALT 21 04/04/2021 0423   ALT 31 03/03/2016 0921   ALKPHOS 65 04/04/2021 0423   ALKPHOS 116 03/03/2016 0921   BILITOT 0.3 04/04/2021 0423   BILITOT 0.71 03/03/2016 0921   GFRNONAA >60 04/04/2021 0423   GFRNONAA 67 07/25/2013 1712   GFRAA >60 10/03/2019 0930   GFRAA 77 07/25/2013 1712   Lipase     Component Value Date/Time   LIPASE 22 02/19/2021 1653       Studies/Results: No results found.  Anti-infectives: Anti-infectives (From admission, onward)    Start     Dose/Rate Route Frequency Ordered Stop   03/30/21 1130  linezolid (ZYVOX) tablet 600 mg        600 mg Oral Every 12 hours 03/30/21 1043 04/03/21 2143   03/09/21 0800  piperacillin-tazobactam (ZOSYN) IVPB 3.375 g        3.375 g 12.5 mL/hr over 240 Minutes Intravenous Every 8 hours 03/09/21 0706 03/25/21 2034   02/26/21 0100  doxycycline (VIBRAMYCIN) 100 mg in sodium chloride 0.9 % 250 mL IVPB        100 mg 125 mL/hr over 120 Minutes Intravenous 2 times daily 02/26/21 0005 03/08/21 2359   02/20/21 0300  piperacillin-tazobactam (ZOSYN) IVPB  3.375 g        3.375 g 12.5 mL/hr over 240 Minutes Intravenous Every 8 hours 02/19/21 1907 03/08/21 2359   02/19/21 1900  ceFEPIme (MAXIPIME) 2 g in sodium chloride 0.9 % 100 mL IVPB  Status:  Discontinued        2 g 200 mL/hr over 30 Minutes Intravenous  Once 02/19/21 1857 02/19/21 1906   02/19/21 1900  metroNIDAZOLE (FLAGYL) IVPB 500 mg  Status:  Discontinued        500 mg 100 mL/hr over 60 Minutes Intravenous  Once 02/19/21 1857 02/19/21 1906   02/19/21 1815  piperacillin-tazobactam (ZOSYN) IVPB 3.375 g        3.375 g 100 mL/hr over 30 Minutes Intravenous  Once 02/19/21 1809 02/19/21 1944        Assessment/Plan POD35 s/p ex lap, sigmoid colectomy, end colostomy with enterorrhaphy and drainage of intraabdominal abscess - Dr. Kieth Brightly and Dr. Radene Knee 9/10 for Acute sigmoid diverticulitis with contained perforation and abscess - CT 9/16 demonstrates heterogenous left  iliopsoas collection likely hematoma, stranding in the retroperitoneum and subcu tissue.  Distal colon appears decompressed and very small caliber but there is contrast entering this area; there is additionally a similarly small-caliber segment of colon in the proximal transverse and the ascending colon is not dilated- suspect over all this represents small bowel/gastric ileus.  IR does not recommend draining the hematoma at this point. ID following and recommending abx through 10/17  - NGT replaced 9/27  - CT 9/27 with continued complex fluid collection along left psoas, SBO with transition in mid jejunum, post-surgical changes of colon resection with colostomy - Was started on reglan last weekend. The risks of tardive dyskinesia/EPS were discussed on 9/20. Patient stated understanding and is okay with continuing Reglan.  - Gastrograffin study 9/28 showed Stable gaseous distention of the small bowel, oral contrast progressed into the colon - CT 10/4 with ileus although improved distention of small bowel, stable psoas collection - Continue to encourage PO, transition way from TPN as caloric intake allows - WOCN following for new ostomy teaching, daily WTD to midline wound  - Encouraged ambulation (working with therapies), use IS, multimodal pain control  - Cont abx per ID recs  Ready for inpatient rehab from sugery perspective   FEN - soft diet, ensure, cyclic TPN VTE - SCDs, therapeutic lovenox  ID - Zosyn 9/4 - 10/12; PO Linezolid 10/12>>10/17   Per primary: VRE UTI - linezolid per ID  A fib - cards following  AKI - resolved ABL anemia -  stable HTN ETOH use L knee pain - per ortho  LOS: 43 days    Travis Morn, MD [facility] 04/04/2021, 9:54 AM Please see Amion for pager number during day hours 7:00am-4:30pm

## 2021-04-04 NOTE — Progress Notes (Signed)
Nutrition Follow-up  DOCUMENTATION CODES:  Not applicable  INTERVENTION:  -48 hour calorie count  -Continue Ensure Enlive po TID, each supplement provides 350 kcal and 20 grams of protein -Continue Magic cup TID with meals, each supplement provides 290 kcal and 9 grams of protein -Continue PROSource PLUS PO 104mls TID, each supplement provides 100 kcals and 15 grams of protein -Continue TPN management per Pharmacy  NUTRITION DIAGNOSIS:  Inadequate oral intake related to acute illness (diverticulitis) as evidenced by per patient/family report, NPO status. -- ongoing  GOAL:  Patient will meet greater than or equal to 90% of their needs -- addressing with supplements and TPN  MONITOR:  Supplement acceptance, PO intake, Diet advancement, Weight trends, Labs, I & O's  REASON FOR ASSESSMENT:  Consult Calorie Count  ASSESSMENT:  Pt with PMH significant for HTN, Afib, h/o diverticulitis, heavy EtOH use, and high cholesterol admitted with severe sepsis due to perforated diverticulitis/intra-abdominal abscess. Pt now s/p laparotomy w/ colectomy complicated by postoperative ileus.  9/04 - developed Afib w/ rapid ventricular response, required IV amiodarone infusion 9/07 - f/u CT w/ increased size of pelvic abscess 9/08 - s/p drainage catheter placement of pelvic abscess 9/09 - TPN initiated 9/10 - s/p ex lap, sigmoid colectomy, end colostomy creation (Hartmann's procedure) w/ enterorrhaphy and drainage of intra-abdominal abscess and woundVAC placement of 50cm area of abdomen 9/16 - CT revealed heterogenous left iliopsoas collection which may represent abscess or hematoma, stranding in the retroperitoneum and subcu tissue; per IR do no recommend aspiration or drainage at this time  9/19 - post-operative anemia requiring 1 unit PRBC   9/20 - s/p lt knee aspiration and injection due to lt knee effusions 9/23 - diet advanced to clear liquids 9/26 - NGT removed; TPN rate reduced to half 9/27 -  NGT replaced and connected to LIWS 2/2 vomiting and abdominal distention, abd xray showing recurrent ileus; pt made NPO 9/29 - surgical drain removed  10/04 - CT w/ ileus although improved distention of small bowel, stable posas collection 10/05 - NGT fell out per MD note 10/06 - pt initiated on clear liquid diet 10/07 - KUB showed dilated loops of small bowel are slightly more prominent than on the prior study, gas noted in the colon 10/09 - diet advanced to full liquids  Pt continues to have poor po intake. Calorie count ordered to monitor for adequacy and to inform management of TPN. Pt to continue TPN until pt eating enough po to meet needs. Per Pharmacy, pt to receive concentrated TPN at 12-hr cycle -- cycle 1652 ml over 12 hours -- 71 ml/hr x 1 hr; then 142 ml/hr x 10 hrs; then 71 ml/hr x 1 hr (provides 101grams protein and 2065 kcals). Pt with good supplement consumption per RN. Continue current nutrition plan of care at this time.   PO intake: 5-25% x 2 recorded meals since last RD assessment  Pt with non-pitting edema to BLE per RN edema assessment  UOP: 3883mL x24 hours I/O: -19822mL since admit  Admit weight: 79.4 kg  Current weight: 93.4 kg   Medications: 40ml prosource plus po TID, Ensure Enlive TID, marinol, colace, lasix, SSI TID w. meals, reglan, protonix Labs: Na 128 (L), hgb 7.0 (L) CBGs 102-147 x 24 hours  Diet Order:   Diet Order             DIET SOFT Room service appropriate? No; Fluid consistency: Thin  Diet effective now  EDUCATION NEEDS:  No education needs have been identified at this time  Skin:  Skin Assessment: Skin Integrity Issues: Skin Integrity Issues:: Incisions Incisions: closed incision to abdomen  Last BM:  10/16 colostomy  Height:  Ht Readings from Last 1 Encounters:  02/26/21 6' (1.829 m)   Weight:  Wt Readings from Last 10 Encounters:  04/04/21 93.4 kg  06/28/20 79.4 kg  04/06/20 78.7 kg  01/05/20 78 kg   11/03/19 78.7 kg  10/03/19 79.4 kg  08/20/19 78.9 kg  07/22/19 79.1 kg  05/02/19 79.4 kg  04/02/18 80.3 kg   BMI:  Body mass index is 27.94 kg/m.  Estimated Nutritional Needs:  Kcal:  2000-2200 Protein:  100-110 grams Fluid:  >2L    Larkin Ina, MS, RD, LDN (she/her/hers) RD pager number and weekend/on-call pager number located in [place].

## 2021-04-04 NOTE — Progress Notes (Signed)
PHARMACY - TOTAL PARENTERAL NUTRITION CONSULT NOTE  Indication: Prolonged ileus  Patient Measurements: Height: 6' (182.9 cm) Weight: 93.4 kg (206 lb) IBW/kg (Calculated) : 77.6 TPN AdjBW (KG): 82.6 Body mass index is 27.94 kg/m.  Assessment:  31 YOM with lower abdominal pain/N/V found to have perforated diverticulitis. CT abd on 9/3 found to have active ileus/pSBO from diverticulitis/bowel inflammation. Pharmacy consulted to manage TPN for prolonged ileus. Of note patient has a history pf alcohol dependency and is at high risk for refeeding.   Glucose / Insulin: no hx DM, A1c 5.9% - CBGs controlled, running in low 100s. Off steroids 10/5. Utilized 3 units SSI in past 24 hrs + 25 units insulin in TPN Electrolytes: Na 136>128, Mg down some to 1.8 (high UOP) Renal: Scr <1, BUN 25 stable. Lasix 20mg  PO daily Hepatic: LFTs / Tbili  WNL, TG 159, albumin 1.7  Intake / Output; MIVF: UOP 1.8 ml/kg/hr on Lasix, net -30.9L per charting - LBM 10/16 GI Imaging: - 9/3 CT abd: active ileus/pSBO - 9/16 CT abd: L-iliopsoas fluid collection (abscess or hematoma), resolution of prior diverticular abscess, still w/ evidence of ileus - 9/27 CT abd: continued retroperitoneal complex fluid collection, SBO - 10/4 CT: Left iliopsoas enlargement heterogeneity persists > may represent intramuscular hematoma, infection or mass.  Ieus, degree of small-bowel distention has decreased, pSBO cannot be excluded. - 10/7 KUB: Dilated loops of small bowel are slightly more prominent than on the prior study. Gas is noted within the colon.  GI Surgeries / Procedures: - 9/10 ex-lap, sigmoid colectomy w/ end colostomy creation, enterorrhaphy, drainage of intra-abd abscess, wound vac placement  Central access: PICC 02/24/21 TPN start date: 02/25/21  Nutritional Goals, RD Estimated Needs Total Energy Estimated Needs: 2000-2200 Total Protein Estimated Needs: 100-110 grams Total Fluid Estimated Needs: >2L  Current Nutrition:   TPN Advanced to soft diet 10/12 - tolerating, intake not reported for 10/16. Calorie count  Plan:  Dronabinol 5mg  BID for appetite Concentrated TPN at 12-hr cycle Cycle 1652 ml over 12 hours - 71 ml/hr x 1 hr; then 142 ml/hr x 10 hrs; then 71 ml/hr x 1 hr TPN will provide 101 g AA, 305 g CHO (GIR 2.5-5)  and 62 g ILE (30%) for total of 2065 kCal, meeting 100% of patient's needs Electrolytes in TPN: increase Na to 150 mEq/L (minimized free water), reduce K to 40 mEq/L, Ca 3 mEq/L, incr Mg 18 mEq/L, Phos 10 mmol/L; Cl:Ac 1:1  Add standard MVI, trace elements, folate, and thiamine to TPN Continue sensitive SSI TID with meals + reduce to 25 units regular insulin in TPN Standard TPN labs Mon and Thurs and prn F/U PO intake/diet advancement and ability to wean TPN - hopefully soon per Surgery. Initiating calorie count 10/16   Kaisley Stiverson S. Alford Highland, PharmD, BCPS Clinical Staff Pharmacist [email or web address] 04/04/2021 7:05 AM

## 2021-04-04 NOTE — Progress Notes (Signed)
Physical Therapy Treatment Patient Details Name: Travis Conrad MRN: [id number] DOB: 03/25/1951 Today's Date: 04/04/2021   History of Present Illness 70 yo admitted 9/3 with abdominal pain, vomiting and ileus. 9/4 Afib with RVR. 9/8 intrabdominal abscess s/p IR drain placed. 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 Pt found to have lt iliopsoas hematoma on CT 9/16.  9/20 left knee aspiration. 9/26 NG tube removed. 9/27 NGT replaced due to vomiting. 10/1 atelectasis. Pt with Afib and elevated HR.  Work up neg so far and MD cleared pt for therapy on 10/11.  PMhx: HTN, HLD, Afib, ETOH use    PT Comments    Pt with improved posture and standing trials today with tolerance for OOB end of session. Pt continues to demonstrate deficits in cognition, balance, strength and function. Wife present throughout with OOB to chair for meals encouraged as well as mobility with staff and HEP. Continue to recommend CIR.  VSS    Recommendations for follow up therapy are one component of a multi-disciplinary discharge planning process, led by the attending physician.  Recommendations may be updated based on patient status, additional functional criteria and insurance authorization.  Follow Up Recommendations  CIR;Supervision/Assistance - 24 hour     Equipment Recommendations  3in1 (PT)    Recommendations for Other Services       Precautions / Restrictions Precautions Precautions: Fall Precaution Comments: abdominal incision, watch HR and BP     Mobility  Bed Mobility Overal bed mobility: Modified Independent             General bed mobility comments: pt able to transition to getting OOB without assist today with bed flat and rail    Transfers Overall transfer level: Needs assistance   Transfers: Sit to/from Stand Sit to Stand: Min guard         General transfer comment: guarding and rW present to rise from bed and x 10 trials  from chair in grossly 3 min  Ambulation/Gait Ambulation/Gait assistance: Min guard Gait Distance (Feet): 110 Feet Assistive device: Rolling walker (2 wheeled) Gait Pattern/deviations: Step-through pattern;Trunk flexed   Gait velocity interpretation: <1.31 ft/sec, indicative of household ambulator General Gait Details: cue for proximity to RW and encouragement for increased distance   Stairs             Wheelchair Mobility    Modified Rankin (Stroke Patients Only)       Balance Overall balance assessment: Needs assistance   Sitting balance-Leahy Scale: Good Sitting balance - Comments: able to sit EOB without support   Standing balance support: Bilateral upper extremity supported;During functional activity Standing balance-Leahy Scale: Poor Standing balance comment: bil UE on RW for gait and standing trials                            Cognition Arousal/Alertness: Awake/alert Behavior During Therapy: Flat affect Overall Cognitive Status: Impaired/Different from baseline Area of Impairment: Safety/judgement;Awareness;Problem solving                   Current Attention Level: Selective Memory: Decreased short-term memory Following Commands: Follows one step commands consistently Safety/Judgement: Decreased awareness of deficits;Decreased awareness of safety   Problem Solving: Slow processing General Comments: continues to lack insight into deficits and function      Exercises General Exercises - Lower Extremity Long Arc Quad: AROM;Both;Seated;20 reps Toe Raises: AROM;20 reps;Both;Seated;Strengthening Heel Raises: AROM;Both;Seated;20 reps;Strengthening  General Comments        Pertinent Vitals/Pain Pain Assessment: No/denies pain    Home Living                      Prior Function            PT Goals (current goals can now be found in the care plan section) Progress towards PT goals: Progressing toward goals     Frequency    Min 3X/week      PT Plan Current plan remains appropriate    Co-evaluation              AM-PAC PT "6 Clicks" Mobility   Outcome Measure  Help needed turning from your back to your side while in a flat bed without using bedrails?: None Help needed moving from lying on your back to sitting on the side of a flat bed without using bedrails?: A Little Help needed moving to and from a bed to a chair (including a wheelchair)?: A Little Help needed standing up from a chair using your arms (e.g., wheelchair or bedside chair)?: A Little Help needed to walk in hospital room?: A Little Help needed climbing 3-5 steps with a railing? : A Lot 6 Click Score: 18    End of Session   Activity Tolerance: Patient tolerated treatment well;Patient limited by fatigue Patient left: with family/visitor present;in chair Nurse Communication: Mobility status PT Visit Diagnosis: Other abnormalities of gait and mobility (R26.89);Difficulty in walking, not elsewhere classified (R26.2)     Time: 1110-1140 PT Time Calculation (min) (ACUTE ONLY): 30 min  Charges:  $Gait Training: 8-22 mins $Therapeutic Exercise: 8-22 mins                     Callia Swim P, PT Acute Rehabilitation Services Pager: [phone] Office: [place] 04/04/2021, 11:52 AM

## 2021-04-04 NOTE — Progress Notes (Signed)
[facility]  PROGRESS NOTE  Travis Conrad LGX:[id number] DOB: 11/11/1950 DOA: 02/19/2021 PCP: [name], MD   Brief HPI:   70 year old male with medical history of atrial fibrillation, dyslipidemia, hypertension, diverticulosis presented with nausea, vomiting abdominal pain.  In the ED he was found to have BP of 84/65, heart rate 57, respiration 20/min, O2 sats 91%.  CT abdomen/pelvis showed acute sigmoid diverticulitis with small extraluminal gas and fluid collection in the sigmoid mesocolon, 3.5 x 1.5 x 2.7 cm consistent with perforation and abscess.  Dilated proximal and decompressed distal small bowel loops, possible ileus. 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  Developed postop ileus Urology and ID were consulted for epididymitis 9/19 developed postop anemia, required 1 unit PRBC Positive left knee pain, positive effusion, underwent left knee aspiration injection with good toleration, fluid showed no crystals, gram stain with no organisms 9/26, NG tube was removed 9/27 patient had vomiting abdominal distention, x-ray showed recurrent ileus, NG tube was replaced and connected to intermittent suction 10/6 patient started on clear liquid diet, diet is being advanced slowly    Subjective   Patient seen and examined, no new complaints.  Appetite is slowly improving.   Assessment/Plan:    Severe sepsis -Secondary to perforated diverticulitis, s/p colectomy -Sepsis physiology has resolved -Left psoas collection, postop ileus -CT on 9/27 showed complex fluid collection along left iliopsoas, SBO with transition in mid jejunum -General surgery consulted IR for drainage of left psoas abscess, IR  did not recommend aspirating at this point of time -S/p Gastrografin study on 03/16/2021, patient being managed by intermittent NG tube clamping, remains on TPN -Antibiotics managed per ID, completed Zosyn on 10/7 -CT scan abdomen on 03/22/2021 showed ileus with improved distention of small bowel, stable psoas collection -Patient with persistent/recurrent ileus, managed by general surgery -NG tube was discontinued and patient advanced to full liquid diet -He is tolerating Ensure, 3 times a day -Also on TPN -Diet and oral intake has been improving, tolerating full liquid diet, to be advanced to soft diet -Continue TPN at half rate -Plan to wean off TPN and start p.o. diet as per surgery -Started on Marinol for appetite stimulation  Postop anemia -S/p 1 unit PRBC on 03/07/2021 -Hemoglobin is 7.0 today -We will transfuse 1 unit PRBC  Paroxysmal atrial fibrillation/hypertension -Patient was on amiodarone and metoprolol with good rate control -Continue apixaban for anticoagulation -He is off Cardizem drip -Currently on p.o. Cardizem, amiodarone  VRE UTI -Urine culture growing vancomycin-resistant Enterococcus -Discussed with Dr. West Bali, she recommends starting Zyvox -Patient started on Zyvox; stop date 04/04/2021  Tremors -Patient is a history of questionable benign essential tremor -Got worse yesterday while working with PT -Denies tremors this morning -Patient was taking sotalol at home which is currently on hold  Right epididymitis -Resolved -Completed therapy with doxycycline  Acute kidney injury/hypokalemia/hyponatremia/hypophosphatemia -Continue TPN per protocol -Electrolyte abnormalities have been resolved  Alcohol withdrawal -Resolved  Left knee arthritis -Clinically improved after steroid injection  Hypertension -Blood pressure is elevated -Continue Cardizem -We will start hydralazine as needed for BP more than 160/100  Acute hypoxemic respiratory  failure -Significantly improved -Continue oxygen 2 L/min -Patient is off steroids  Anasarca -Volume status improving with TPN IV Lasix -IV Lasix discontinued and started on p.o. Lasix 20  mg daily -Net -31 L  Hyponatremia -Serum sodium is 128   Scheduled medications:    (feeding supplement) PROSource Plus  30 mL Oral TID BM   acetaminophen  650 mg Oral Q6H   amiodarone  200 mg Oral Daily   apixaban  5 mg Oral BID   Chlorhexidine Gluconate Cloth  6 each Topical Daily   diltiazem  60 mg Oral Q6H   docusate sodium  100 mg Oral BID   dronabinol  5 mg Oral BID AC   feeding supplement  237 mL Oral TID BM   furosemide  20 mg Oral Daily   insulin aspart  0-9 Units Subcutaneous TID WC   lidocaine  1 patch Transdermal Q24H   methocarbamol  1,000 mg Oral QID   metoCLOPramide (REGLAN) injection  10 mg Intravenous Q6H   nicotine  21 mg Transdermal Daily   pantoprazole  40 mg Oral BID   sodium chloride flush  10 mL Intracatheter Q12H     Data Reviewed:   CBG:  Recent Labs  Lab 04/03/21 1138 04/03/21 1534 04/03/21 2000 04/04/21 0757 04/04/21 1136  GLUCAP 147* 102* 127* 107* 114*    SpO2: 97 % O2 Flow Rate (L/min): 2 L/min    Vitals:   04/04/21 0500 04/04/21 0528 04/04/21 0800 04/04/21 1254  BP:  (!) 127/57 (!) 116/51 (!) 118/51  Pulse:  100 88 94  Resp:  18 16 17   Temp:   98.1 F (36.7 C) 98.5 F (36.9 C)  TempSrc:   Oral Oral  SpO2:  98% 96% 97%  Weight: 93.4 kg     Height:         Intake/Output Summary (Last 24 hours) at 04/04/2021 1423 Last data filed at 04/04/2021 0917 Gross per 24 hour  Intake 1499.39 ml  Output 4450 ml  Net -2950.61 ml    10/15 1901 - 10/17 0700 In: 3559.9 [P.O.:840; I.V.:2719.9] Out: 5500 [Urine:5500]  Filed Weights   04/02/21 0500 04/03/21 0409 04/04/21 0500  Weight: 95.3 kg 91.2 kg 93.4 kg    Data Reviewed: Basic Metabolic Panel: Recent Labs  Lab 03/31/21 0441 04/01/21 0329 04/02/21 0500 04/03/21 0500 04/04/21 0423   NA 129* 131* 136 129* 128*  K 4.0 3.9 4.3 4.5 4.2  CL 97* 98 103 98 97*  CO2 24 27 26 24 26   GLUCOSE 143* 146* 138* 100* 174*  BUN 24* 25* 27* 26* 25*  CREATININE 1.08 1.03 0.83 0.81 0.82  CALCIUM 8.0* 8.0* 7.9* 8.0* 8.0*  MG 2.1 2.1 2.2 2.1 1.8  PHOS 4.1 3.3 3.5 3.6 3.5   Liver Function Tests: Recent Labs  Lab 03/31/21 0441 04/03/21 0500 04/04/21 0423  AST 20 17 14*  ALT 29 22 21   ALKPHOS 75 65 65  BILITOT 0.8 0.3 0.3  PROT 5.7* 5.7* 5.4*  ALBUMIN 1.8* 1.8* 1.7*   No results for input(s): LIPASE, AMYLASE in the last 168 hours. No results for input(s): AMMONIA in the last 168 hours. CBC: Recent Labs  Lab 04/04/21 0423  WBC 8.5  HGB 7.0*  HCT 23.1*  MCV 95.1  PLT 405*   Cardiac Enzymes: No results for input(s): CKTOTAL, CKMB, CKMBINDEX, TROPONINI in the last 168 hours. BNP (last 3 results) No results for input(s): BNP in the last 8760 hours.  ProBNP (last 3 results) No results for input(s): PROBNP in the last 8760 hours.  CBG: Recent Labs  Lab 04/03/21 1138 04/03/21 1534 04/03/21 2000 04/04/21 0757 04/04/21 1136  GLUCAP 147* 102*  [place]       Radiology Reports  No results found.     Antibiotics: Anti-infectives (From admission, onward)    Start     Dose/Rate Route Frequency Ordered Stop   03/30/21 1130  linezolid (ZYVOX) tablet 600 mg        600 mg Oral Every 12 hours 03/30/21 1043 04/03/21 2143   03/09/21 0800  piperacillin-tazobactam (ZOSYN) IVPB 3.375 g        3.375 g 12.5 mL/hr over 240 Minutes Intravenous Every 8 hours 03/09/21 0706 03/25/21 2034   02/26/21 0100  doxycycline (VIBRAMYCIN) 100 mg in sodium chloride 0.9 % 250 mL IVPB        100 mg 125 mL/hr over 120 Minutes Intravenous 2 times daily 02/26/21 0005 03/08/21 2359   02/20/21 0300  piperacillin-tazobactam (ZOSYN) IVPB 3.375 g        3.375 g 12.5 mL/hr over 240 Minutes Intravenous Every 8 hours 02/19/21 1907 03/08/21 2359   02/19/21 1900  ceFEPIme (MAXIPIME) 2 g in  sodium chloride 0.9 % 100 mL IVPB  Status:  Discontinued        2 g 200 mL/hr over 30 Minutes Intravenous  Once 02/19/21 1857 02/19/21 1906   02/19/21 1900  metroNIDAZOLE (FLAGYL) IVPB 500 mg  Status:  Discontinued        500 mg 100 mL/hr over 60 Minutes Intravenous  Once 02/19/21 1857 02/19/21 1906   02/19/21 1815  piperacillin-tazobactam (ZOSYN) IVPB 3.375 g        3.375 g 100 mL/hr over 30 Minutes Intravenous  Once 02/19/21 1809 02/19/21 1944         DVT prophylaxis: Lovenox  Code Status: Full code  Family Communication: No family at bedside   Consultants:   Procedures:     Objective    Physical Examination:  General-appears in no acute distress Heart-S1-S2, regular, no murmur auscultated Lungs-clear to auscultation bilaterally, no wheezing or crackles auscultated Abdomen-soft, nontender, no organomegaly Extremities-no edema in the lower extremities Neuro-alert, oriented x3, no focal deficit noted  Status is: Inpatient  Dispo: The patient is from: Home              Anticipated d/c is to: CIR              Anticipated d/c date is: 04/05/2021              Patient currently not stable for discharge  Barrier to discharge-ongoing treatment for UTI  COVID-19 Labs  No results for input(s): DDIMER, FERRITIN, LDH, CRP in the last 72 hours.  Lab Results  Component Value Date   [place] NEGATIVE 02/19/2021   [place] NEGATIVE 09/30/2019   [place] NEGATIVE 08/16/2019         Recent Results (from the past 240 hour(s))  Urine Culture     Status: Abnormal   Collection Time: 03/28/21  8:11 AM   Specimen: Urine, Clean Catch  Result Value Ref Range Status   Specimen Description URINE, CLEAN CATCH  Final   Special Requests   Final    NONE Performed at [facility], [place] [address] [place], [place] [address]    Culture (A)  Final    >=100,000 COLONIES/mL VANCOMYCIN RESISTANT ENTEROCOCCUS   Report Status 03/30/2021 FINAL  Final   Organism  ID, Bacteria VANCOMYCIN RESISTANT ENTEROCOCCUS (A)  Final      Susceptibility   Vancomycin resistant enterococcus - MIC*    AMPICILLIN >=32 RESISTANT Resistant  NITROFURANTOIN 32 SENSITIVE Sensitive     VANCOMYCIN >=32 RESISTANT Resistant     LINEZOLID 2 SENSITIVE Sensitive     * >=100,000 COLONIES/mL VANCOMYCIN RESISTANT ENTEROCOCCUS    [place]   [facility] If 7PM-7AM, please contact night-coverage at [email or web address], Office  [phone]   04/04/2021, 2:23 PM  LOS: 43 days

## 2021-04-04 NOTE — Progress Notes (Signed)
OT Cancellation Note  Patient Details Name: TITAN KARNER MRN: [id number] DOB: 12-19-1950   Cancelled Treatment:    Reason Eval/Treat Not Completed: Medical issues which prohibited therapy (patient to receive blood transfusion and wanted to hold off on OT) Lodema Hong, [place]  Pager [phone] Office [phone]  Trixie Dredge 04/04/2021, 3:38 PM

## 2021-04-04 NOTE — Progress Notes (Signed)
Inpatient Rehabilitation Admissions Coordinator   I met at bedside with patient and his wife. I will provide updated clinicals to [facility] as well as discuss with our Rehab MD if we can admit him on current TPN schedule.   Danne Baxter, RN, MSN Rehab Admissions Coordinator [phone] 04/04/2021 12:09 PM

## 2021-04-05 DIAGNOSIS — N179 Acute kidney failure, unspecified: Secondary | ICD-10-CM | POA: Diagnosis not present

## 2021-04-05 DIAGNOSIS — I10 Essential (primary) hypertension: Secondary | ICD-10-CM | POA: Diagnosis not present

## 2021-04-05 DIAGNOSIS — I482 Chronic atrial fibrillation, unspecified: Secondary | ICD-10-CM | POA: Diagnosis not present

## 2021-04-05 DIAGNOSIS — K5720 Diverticulitis of large intestine with perforation and abscess without bleeding: Secondary | ICD-10-CM | POA: Diagnosis not present

## 2021-04-05 LAB — GLUCOSE, CAPILLARY
Glucose-Capillary: 121 mg/dL — ABNORMAL HIGH (ref 70–99)
Glucose-Capillary: 111 mg/dL — ABNORMAL HIGH (ref 70–99)
Glucose-Capillary: 107 mg/dL — ABNORMAL HIGH (ref 70–99)

## 2021-04-05 LAB — CBC
HCT: 28.7 % — ABNORMAL LOW (ref 39.0–52.0)
Hemoglobin: 9 g/dL — ABNORMAL LOW (ref 13.0–17.0)
MCH: 29 pg (ref 26.0–34.0)
MCHC: 31.4 g/dL (ref 30.0–36.0)
MCV: 92.6 fL (ref 80.0–100.0)
Platelets: 369 10*3/uL (ref 150–400)
RBC: 3.1 MIL/uL — ABNORMAL LOW (ref 4.22–5.81)
RDW: 16.8 % — ABNORMAL HIGH (ref 11.5–15.5)
WBC: 10.3 10*3/uL (ref 4.0–10.5)
nRBC: 0 % (ref 0.0–0.2)

## 2021-04-05 LAB — BPAM RBC
Blood Product Expiration Date: 202211022359
ISSUE DATE / TIME: 202210171634
Unit Type and Rh: 6200

## 2021-04-05 LAB — TYPE AND SCREEN
ABO/RH(D): A POS
Antibody Screen: NEGATIVE
Unit division: 0

## 2021-04-05 MED ORDER — FOLIC ACID 1 MG PO TABS
1.0000 mg | ORAL_TABLET | Freq: Every day | ORAL | Status: DC
  Administered 2021-04-06: 1 mg via ORAL
  Filled 2021-04-05: qty 1

## 2021-04-05 MED ORDER — THIAMINE HCL 100 MG PO TABS
100.0000 mg | ORAL_TABLET | Freq: Every day | ORAL | Status: DC
  Administered 2021-04-06: 100 mg via ORAL
  Filled 2021-04-05: qty 1

## 2021-04-05 MED ORDER — TRACE MINERALS CU-MN-SE-ZN 300-55-60-3000 MCG/ML IV SOLN: INTRAVENOUS | Status: DC

## 2021-04-05 MED ORDER — METOCLOPRAMIDE HCL 5 MG/ML IJ SOLN
5.0000 mg | Freq: Four times a day (QID) | INTRAMUSCULAR | Status: DC
Start: 2021-04-05 — End: 2021-04-06
  Administered 2021-04-05 – 2021-04-06 (×4): 5 mg via INTRAVENOUS
  Filled 2021-04-05 (×4): qty 2

## 2021-04-05 MED ORDER — TRACE MINERALS CU-MN-SE-ZN 300-55-60-3000 MCG/ML IV SOLN
INTRAVENOUS | Status: AC
  Filled 2021-04-05: qty 338.4

## 2021-04-05 NOTE — Progress Notes (Signed)
Inpatient Rehab Admissions Coordinator:  Saw pt and wife at bedside. Informed them that are awaiting bed availability in CIR.  Will continue to follow.    Gayland Curry, [place], [place] Admissions Coordinator [phone]

## 2021-04-05 NOTE — Plan of Care (Signed)
  Problem: Health Behavior/Discharge Planning: Goal: Ability to manage health-related needs will improve Outcome: Progressing   Problem: Clinical Measurements: Goal: Ability to maintain clinical measurements within normal limits will improve Outcome: Progressing Goal: Will remain free from infection Outcome: Progressing Goal: Diagnostic test results will improve Outcome: Progressing Goal: Respiratory complications will improve Outcome: Progressing Goal: Cardiovascular complication will be avoided Outcome: Progressing   Problem: Activity: Goal: Risk for activity intolerance will decrease Outcome: Progressing   Problem: Nutrition: Goal: Adequate nutrition will be maintained Outcome: Progressing   Problem: Coping: Goal: Level of anxiety will decrease Outcome: Progressing   Problem: Elimination: Goal: Will not experience complications related to bowel motility Outcome: Progressing Goal: Will not experience complications related to urinary retention Outcome: Progressing   Problem: Pain Managment: Goal: General experience of comfort will improve Outcome: Progressing   Problem: Safety: Goal: Ability to remain free from injury will improve Outcome: Progressing   Problem: Skin Integrity: Goal: Risk for impaired skin integrity will decrease Outcome: Progressing   Problem: Education: Goal: Knowledge of disease or condition will improve Outcome: Progressing Goal: Understanding of medication regimen will improve Outcome: Progressing Goal: Individualized Educational Video(s) Outcome: Progressing   Problem: Activity: Goal: Ability to tolerate increased activity will improve Outcome: Progressing   Problem: Cardiac: Goal: Ability to achieve and maintain adequate cardiopulmonary perfusion will improve Outcome: Progressing   Problem: Health Behavior/Discharge Planning: Goal: Ability to safely manage health-related needs after discharge will improve Outcome: Progressing    Problem: Education: Goal: Required Educational Video(s) Outcome: Progressing   Problem: Clinical Measurements: Goal: Ability to maintain clinical measurements within normal limits will improve Outcome: Progressing Goal: Postoperative complications will be avoided or minimized Outcome: Progressing   Problem: Skin Integrity: Goal: Demonstration of wound healing without infection will improve Outcome: Progressing

## 2021-04-05 NOTE — Progress Notes (Addendum)
PHARMACY - TOTAL PARENTERAL NUTRITION CONSULT NOTE  Indication: Prolonged ileus  Patient Measurements: Height: 6' (182.9 cm) Weight: 93.4 kg (206 lb) IBW/kg (Calculated) : 77.6 TPN AdjBW (KG): 82.6 Body mass index is 27.94 kg/m.  Assessment:  44 YOM with lower abdominal pain/N/V found to have perforated diverticulitis. CT abd on 9/3 found to have active ileus/pSBO from diverticulitis/bowel inflammation. Pharmacy consulted to manage TPN for prolonged ileus. Of note patient has a history pf alcohol dependency and is at high risk for refeeding.   Glucose / Insulin: no hx DM, A1c 5.9% - CBGs controlled, running in lower 100s. Off steroids 10/5. Utilized 1 units SSI in past 24 hrs + 25 units insulin in TPN Electrolytes: Na 136>128, Mg down some to 1.8 (high UOP) Renal: Scr <1, BUN 25 stable. Lasix 20mg  PO daily Hepatic: LFTs / Tbili  WNL, TG 159, albumin 1.7  Intake / Output; MIVF: UOP 1.6 ml/kg/hr on Lasix, net -33.7L per charting - LBM 10/1, po intake 600 charted (soft diet) GI Imaging: - 9/3 CT abd: active ileus/pSBO - 9/16 CT abd: L-iliopsoas fluid collection (abscess or hematoma), resolution of prior diverticular abscess, still w/ evidence of ileus - 9/27 CT abd: continued retroperitoneal complex fluid collection, SBO - 10/4 CT: Left iliopsoas enlargement heterogeneity persists > may represent intramuscular hematoma, infection or mass.  Ieus, degree of small-bowel distention has decreased, pSBO cannot be excluded. - 10/7 KUB: Dilated loops of small bowel are slightly more prominent than on the prior study. Gas is noted within the colon.  GI Surgeries / Procedures: - 9/10 ex-lap, sigmoid colectomy w/ end colostomy creation, enterorrhaphy, drainage of intra-abd abscess, wound vac placement  Central access: PICC 02/24/21 TPN start date: 02/25/21  Nutritional Goals, RD Estimated Needs Total Energy Estimated Needs: 2000-2200 Total Protein Estimated Needs: 100-110 grams Total Fluid Estimated  Needs: >2L  Current Nutrition:  TPN Advanced to soft diet 10/12 - tolerating, 600 documented - Ensure Enlive po TID, each supplement provides 350 kcal and 20 grams of protein -Magic cup TID with meals, each supplement provides 290 kcal and 9 grams of protein - PROSource PLUS PO 9mls TID, each supplement provides 100 kcals and 15 grams of protein 10/16: Calorie count: Pt continues to have poor po intake. Pt with good supplement consumption per RN. Continue current nutrition plan of care at this time.   Plan:  Dronabinol 5mg  BID for appetite - Full rate TPN provides 101 g AA, 305 g CHO (GIR 2.5-5)  and 62 g ILE (30%) for total of 2065 kCal, meeting 100% of patient's extimated needs - Supplements providing 2220kcal and 142g protein alone when all consumed. Discussed with RD. Meeting >60% needs with supplements. Ok to cut TPN in 1/2 today (to provide 50.76g protein and 993 kcal) Con't Concentrated TPN at 12-hr cycle but now Cycle 781 ml (1/2 amount)  over 12 hours - 36 ml/hr x 1 hr; then 71 ml/hr x 10 hrs; then 36 ml/hr x 1 hr  Electrolytes in TPN: Na to 150 mEq/L (minimized free water), K to 40 mEq/L, Ca 3 mEq/L, Mg 18 mEq/L, Phos 10 mmol/L; Cl:Ac 1:1  Add standard MVI, trace elements, TPN Continue sensitive SSI TID with meals + reduce to 25 units regular insulin in TPN Standard TPN labs Mon and Thurs and prn--labs 10/19. Change thiamine and FA to po  Travis Conrad, PharmD, BCPS Clinical Staff Pharmacist [email or web address] 04/05/2021 7:13 AM

## 2021-04-05 NOTE — Progress Notes (Signed)
Progress Note  38 Days Post-Op  Subjective: No new complaints.  Ate small bits of his food yesterday.  Objective: Vital signs in last 24 hours: Temp:  [97.9 F (36.6 C)-98.5 F (36.9 C)] 97.9 F (36.6 C) (10/17 1925) Pulse Rate:  [90-94] 91 (10/17 1715) Resp:  [16-18] 17 (10/17 1925) BP: (115-141)/(49-64) 141/57 (10/18 0400) SpO2:  [95 %-99 %] 99 % (10/17 1925) Last BM Date: 04/04/21  Intake/Output from previous day: 10/17 0701 - 10/18 0700 In: 760 [P.O.:600; I.V.:160] Out: 3850 [Urine:3600; Stool:250] Intake/Output this shift: No intake/output data recorded.  PE: Gen:  Alert, NAD, pleasant Abd: Soft, ND, NT, +BS. Stoma appears viable with gas/stool present in ostomy appliance. Midline dressing clean and intact with beefy red granulation tissue   Lab Results:  Recent Labs    04/04/21 0423 04/05/21 0238  WBC 8.5 10.3  HGB 7.0* 9.0*  HCT 23.1* 28.7*  PLT 405* 369   BMET Recent Labs    04/03/21 0500 04/04/21 0423  NA 129* 128*  K 4.5 4.2  CL 98 97*  CO2 24 26  GLUCOSE 100* 174*  BUN 26* 25*  CREATININE 0.81 0.82  CALCIUM 8.0* 8.0*   PT/INR No results for input(s): LABPROT, INR in the last 72 hours. CMP     Component Value Date/Time   NA 128 (L) 04/04/2021 0423   NA 134 09/30/2019 0959   NA 142 03/03/2016 0921   K 4.2 04/04/2021 0423   K 4.3 03/03/2016 0921   CL 97 (L) 04/04/2021 0423   CO2 26 04/04/2021 0423   CO2 25 03/03/2016 0921   GLUCOSE 174 (H) 04/04/2021 0423   GLUCOSE 89 03/03/2016 0921   BUN 25 (H) 04/04/2021 0423   BUN 11 09/30/2019 0959   BUN 13.6 03/03/2016 0921   CREATININE 0.82 04/04/2021 0423   CREATININE 1.2 03/03/2016 0921   CALCIUM 8.0 (L) 04/04/2021 0423   CALCIUM 9.6 03/03/2016 0921   PROT 5.4 (L) 04/04/2021 0423   PROT 7.3 03/03/2016 0921   ALBUMIN 1.7 (L) 04/04/2021 0423   ALBUMIN 3.8 03/03/2016 0921   AST 14 (L) 04/04/2021 0423   AST 36 (H) 03/03/2016 0921   ALT 21 04/04/2021 0423   ALT 31 03/03/2016 0921    ALKPHOS 65 04/04/2021 0423   ALKPHOS 116 03/03/2016 0921   BILITOT 0.3 04/04/2021 0423   BILITOT 0.71 03/03/2016 0921   GFRNONAA >60 04/04/2021 0423   GFRNONAA 67 07/25/2013 1712   GFRAA >60 10/03/2019 0930   GFRAA 77 07/25/2013 1712   Lipase     Component Value Date/Time   LIPASE 22 02/19/2021 1653       Studies/Results: No results found.  Anti-infectives: Anti-infectives (From admission, onward)    Start     Dose/Rate Route Frequency Ordered Stop   03/30/21 1130  linezolid (ZYVOX) tablet 600 mg        600 mg Oral Every 12 hours 03/30/21 1043 04/03/21 2143   03/09/21 0800  piperacillin-tazobactam (ZOSYN) IVPB 3.375 g        3.375 g 12.5 mL/hr over 240 Minutes Intravenous Every 8 hours 03/09/21 0706 03/25/21 2034   02/26/21 0100  doxycycline (VIBRAMYCIN) 100 mg in sodium chloride 0.9 % 250 mL IVPB        100 mg 125 mL/hr over 120 Minutes Intravenous 2 times daily 02/26/21 0005 03/08/21 2359   02/20/21 0300  piperacillin-tazobactam (ZOSYN) IVPB 3.375 g        3.375 g 12.5 mL/hr over 240  Minutes Intravenous Every 8 hours 02/19/21 1907 03/08/21 2359   02/19/21 1900  ceFEPIme (MAXIPIME) 2 g in sodium chloride 0.9 % 100 mL IVPB  Status:  Discontinued        2 g 200 mL/hr over 30 Minutes Intravenous  Once 02/19/21 1857 02/19/21 1906   02/19/21 1900  metroNIDAZOLE (FLAGYL) IVPB 500 mg  Status:  Discontinued        500 mg 100 mL/hr over 60 Minutes Intravenous  Once 02/19/21 1857 02/19/21 1906   02/19/21 1815  piperacillin-tazobactam (ZOSYN) IVPB 3.375 g        3.375 g 100 mL/hr over 30 Minutes Intravenous  Once 02/19/21 1809 02/19/21 1944        Assessment/Plan POD38, s/p ex lap, sigmoid colectomy, end colostomy with enterorrhaphy and drainage of intraabdominal abscess - Dr. Kieth Brightly and Dr. Radene Knee 9/10 for Acute sigmoid diverticulitis with contained perforation and abscess - CT 9/16 demonstrates heterogenous left iliopsoas collection likely hematoma, ID following and  recommending abx through 10/17  - Was started on reglan 9/20. Will start to wean this since he is having bowel function.  5mg  q 6 hrs, and then will wean further - CT 10/4 with stable psoas collection - Continue to encourage PO, transition away from TPN as caloric intake allows.  Calorie count in process - WOCN following for new ostomy teaching, daily WTD to midline wound  - Encouraged ambulation (working with therapies), use IS, multimodal pain control   -surgically ready for inpatient rehab   FEN - soft diet, ensure, cyclic TPN VTE - SCDs, eliquis ID - Zosyn 9/4 - 10/12; PO Linezolid 10/12>>10/17   Per primary: VRE UTI - linezolid per ID  A fib - cards following  AKI - resolved ABL anemia -  stable HTN ETOH use L knee pain - per ortho  LOS: 44 days    Henreitta Cea, [facility] 04/05/2021, 8:11 AM Please see Amion for pager number during day hours 7:00am-4:30pm

## 2021-04-05 NOTE — Progress Notes (Signed)
Occupational Therapy Treatment Patient Details Name: Travis Conrad MRN: [id number] DOB: 1950-12-07 Today's Date: 04/05/2021   History of present illness 70 yo admitted 9/3 with abdominal pain, vomiting and ileus. 9/4 Afib with RVR. 9/8 intrabdominal abscess s/p IR drain placed. 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 Pt found to have lt iliopsoas hematoma on CT 9/16.  9/20 left knee aspiration. 9/26 NG tube removed. 9/27 NGT replaced due to vomiting. 10/1 atelectasis. Pt with Afib and elevated HR.  Work up neg so far and MD cleared pt for therapy on 10/11.  PMhx: HTN, HLD, Afib, ETOH use   OT comments  Patient received in bed and agreeable to OT treatment. Patient was able to get to eob with initiation cue. Patient ambulated to sink and performed grooming tasks with min guard but required seated rest break following. Patient performed 3 sit to stands from recliner with min assist to min guard to stand and tolerated less than a minute for each stand.  Patient encouraged to stay in chair at least an hour.  Acute OT to continue to follow.    Recommendations for follow up therapy are one component of a multi-disciplinary discharge planning process, led by the attending physician.  Recommendations may be updated based on patient status, additional functional criteria and insurance authorization.    Follow Up Recommendations  CIR    Equipment Recommendations  3 in 1 bedside commode    Recommendations for Other Services      Precautions / Restrictions Precautions Precautions: Fall Precaution Comments: abdominal incision, watch HR and BP Restrictions Weight Bearing Restrictions: No       Mobility Bed Mobility Overal bed mobility: Modified Independent             General bed mobility comments: able to get to eob with verbal cues to intiate    Transfers Overall transfer level: Needs assistance Equipment used: Rolling walker  (2 wheeled) Transfers: Sit to/from Stand Sit to Stand: Min guard Stand pivot transfers: Min guard       General transfer comment: used rw for transfers    Balance Overall balance assessment: Needs assistance Sitting-balance support: No upper extremity supported Sitting balance-Leahy Scale: Good Sitting balance - Comments: able to sit EOB without support   Standing balance support: Single extremity supported;During functional activity Standing balance-Leahy Scale: Poor Standing balance comment: able to stand at sink for grooming                           ADL either performed or assessed with clinical judgement   ADL Overall ADL's : Needs assistance/impaired     Grooming: Wash/dry face;Wash/dry hands;Brushing hair;Standing;Min guard Grooming Details (indicate cue type and reason): performed standing at sink                             Functional mobility during ADLs: Minimal assistance;Rolling walker General ADL Comments: required seated rest break following standing at sink for [employer]     Praxis      Cognition Arousal/Alertness: Awake/alert Behavior During Therapy: Flat affect Overall Cognitive Status: Impaired/Different from baseline Area of Impairment: Safety/judgement;Awareness;Problem solving                   Current Attention Level: Selective Memory: Decreased short-term memory Following Commands: Follows one  step commands consistently Safety/Judgement: Decreased awareness of deficits;Decreased awareness of safety Awareness: Intellectual Problem Solving: Slow processing General Comments: requires encouragement to stay out of bed        Exercises     Shoulder Instructions       General Comments      Pertinent Vitals/ Pain       Pain Assessment: Faces Faces Pain Scale: Hurts little more Pain Location: abdomen Pain Descriptors / Indicators: Discomfort;Grimacing Pain Intervention(s):  Premedicated before session  Home Living                                          Prior Functioning/Environment              Frequency  Min 2X/week        Progress Toward Goals  OT Goals(current goals can now be found in the care plan section)  Progress towards OT goals: Progressing toward goals  Acute Rehab OT Goals Patient Stated Goal: to get back to baseline OT Goal Formulation: With patient Time For Goal Achievement: 04/12/21 Potential to Achieve Goals: Good ADL Goals Pt Will Perform Grooming: with modified independence;standing Pt Will Perform Lower Body Bathing: with modified independence;sit to/from stand Pt Will Perform Lower Body Dressing: with modified independence;with adaptive equipment Pt Will Transfer to Toilet: with supervision;ambulating Pt Will Perform Toileting - Clothing Manipulation and hygiene: with supervision;sitting/lateral leans;sit to/from stand Additional ADL Goal #1: Pt will independently verbalize 3 strateiges to reduce risk of falls  Plan Discharge plan needs to be updated    Co-evaluation                 AM-PAC OT "6 Clicks" Daily Activity     Outcome Measure   Help from another person eating meals?: A Little Help from another person taking care of personal grooming?: None Help from another person toileting, which includes using toliet, bedpan, or urinal?: A Little Help from another person bathing (including washing, rinsing, drying)?: A Little Help from another person to put on and taking off regular upper body clothing?: A Little Help from another person to put on and taking off regular lower body clothing?: A Lot 6 Click Score: 18    End of Session Equipment Utilized During Treatment: Rolling walker  OT Visit Diagnosis: Muscle weakness (generalized) (M62.81);Pain   Activity Tolerance Patient tolerated treatment well   Patient Left in chair;with call bell/phone within reach;with family/visitor  present   Nurse Communication Mobility status        Time: 0165-5374 OT Time Calculation (min): 25 min  Charges: OT General Charges $OT Visit: 1 Visit OT Treatments $Self Care/Home Management : 8-22 mins $Therapeutic Activity: 8-22 mins  Lodema Hong, OTA Acute Rehabilitation Services  Pager [phone] Office [phone]   Trixie Dredge 04/05/2021, 12:48 PM

## 2021-04-05 NOTE — Progress Notes (Signed)
[facility]  PROGRESS NOTE  Travis Conrad:[id number] DOB: 03-Jul-1950 DOA: 02/19/2021 PCP: [name], MD   Brief HPI:   70 year old male with medical history of atrial fibrillation, dyslipidemia, hypertension, diverticulosis presented with nausea, vomiting abdominal pain.  In the ED he was found to have BP of 84/65, heart rate 57, respiration 20/min, O2 sats 91%.  CT abdomen/pelvis showed acute sigmoid diverticulitis with small extraluminal gas and fluid collection in the sigmoid mesocolon, 3.5 x 1.5 x 2.7 cm consistent with perforation and abscess.  Dilated proximal and decompressed distal small bowel loops, possible ileus. 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  Developed postop ileus Urology and ID were consulted for epididymitis 9/19 developed postop anemia, required 1 unit PRBC Positive left knee pain, positive effusion, underwent left knee aspiration injection with good toleration, fluid showed no crystals, gram stain with no organisms 9/26, NG tube was removed 9/27 patient had vomiting abdominal distention, x-ray showed recurrent ileus, NG tube was replaced and connected to intermittent suction 10/6 patient started on clear liquid diet, diet is being advanced slowly    Subjective   Patient seen and examined, no new complaints.  Still has an adequate poor p.o. intake.   Assessment/Plan:    Severe sepsis -Secondary to perforated diverticulitis, s/p colectomy -Sepsis physiology has resolved -Left psoas collection, postop ileus -CT on 9/27 showed complex fluid collection along left iliopsoas, SBO with transition in mid jejunum -General surgery consulted IR for drainage of left psoas  abscess, IR did not recommend aspirating at this point of time -S/p Gastrografin study on 03/16/2021, patient being managed by intermittent NG tube clamping, remains on TPN -Antibiotics managed per ID, completed Zosyn on 10/7 -CT scan abdomen on 03/22/2021 showed ileus with improved distention of small bowel, stable psoas collection -Patient with persistent/recurrent ileus, managed by general surgery -NG tube was discontinued and patient advanced to full liquid diet -He is tolerating Ensure, 3 times a day -Also on TPN -Diet and oral intake has been improving, tolerating full liquid diet, to be advanced to soft diet -Continue TPN at half rate -Plan to wean off TPN and start p.o. diet as per surgery -Started on Marinol for appetite stimulation  Postop anemia -S/p 1 unit PRBC on 03/07/2021 -Hemoglobin was 7.0 yesterday, s/p 1 unit PRBC  -hemoglobin is 9.0 this morning   Paroxysmal atrial fibrillation/hypertension -Patient was on amiodarone and metoprolol with good rate control -Continue apixaban for anticoagulation -He is off Cardizem drip -Currently on p.o. Cardizem, amiodarone  VRE UTI -Urine culture growing vancomycin-resistant Enterococcus -Discussed with Dr. [place] Bali, she recommends starting Zyvox -Patient was treated with Zyvox, stopped on 04/04/2021   Tremors -Patient is a history of questionable benign essential tremor -Got worse yesterday while working with PT -Denies tremors this morning -Patient was taking sotalol at home which is currently on hold  Right epididymitis -Resolved -Completed therapy with doxycycline  Acute kidney injury/hypokalemia/hyponatremia/hypophosphatemia -Continue TPN per protocol -Electrolyte abnormalities have been resolved  Alcohol withdrawal -Resolved  Left knee arthritis -Clinically improved after steroid injection  Hypertension -Blood pressure is elevated -Continue Cardizem -hydralazine as needed for BP more than 160/100  Acute  hypoxemic respiratory failure -Significantly improved -Continue oxygen 2 L/min -Patient is off steroids  Anasarca -Volume status improving with TPN IV Lasix -IV Lasix  discontinued and started on p.o. Lasix 20 mg daily -Net -34 L  Hyponatremia -Serum sodium is 128   Scheduled medications:    (feeding supplement) PROSource Plus  30 mL Oral TID BM   acetaminophen  650 mg Oral Q6H   amiodarone  200 mg Oral Daily   apixaban  5 mg Oral BID   Chlorhexidine Gluconate Cloth  6 each Topical Daily   diltiazem  60 mg Oral Q6H   docusate sodium  100 mg Oral BID   dronabinol  5 mg Oral BID AC   feeding supplement  237 mL Oral TID BM   [START ON 54/65/6812] folic acid  1 mg Oral Daily   furosemide  20 mg Oral Daily   hydrocortisone cream   Topical BID   insulin aspart  0-9 Units Subcutaneous TID WC   lidocaine  1 patch Transdermal Q24H   methocarbamol  1,000 mg Oral QID   metoCLOPramide (REGLAN) injection  5 mg Intravenous Q6H   nicotine  21 mg Transdermal Daily   pantoprazole  40 mg Oral BID   sodium chloride flush  10 mL Intracatheter Q12H   [START ON 04/06/2021] thiamine  100 mg Oral Daily     Data Reviewed:   CBG:  Recent Labs  Lab 04/04/21 0757 04/04/21 1136 04/04/21 1628 04/05/21 0738 04/05/21 1129  GLUCAP 107* 114* 115* 121* 111*    SpO2: 97 % O2 Flow Rate (L/min): 2 L/min    Vitals:   04/05/21 0400 04/05/21 0800 04/05/21 1212 04/05/21 1310  BP: (!) 141/57 (!) 124/59 (!) 133/58 (!) 124/59  Pulse:  85  87  Resp:  15  18  Temp:  98.1 F (36.7 C)    TempSrc:  Oral    SpO2:  91%  97%  Weight:      Height:         Intake/Output Summary (Last 24 hours) at 04/05/2021 1553 Last data filed at 04/05/2021 0900 Gross per 24 hour  Intake 270 ml  Output 2500 ml  Net -2230 ml    10/16 1901 - 10/18 0700 In: 2249.4 [P.O.:600; I.V.:1649.4] Out: 5325 [XNTZG:0174]  Filed Weights   04/02/21 0500 04/03/21 0409 04/04/21 0500  Weight: 95.3 kg 91.2 kg 93.4 kg     Data Reviewed: Basic Metabolic Panel: Recent Labs  Lab 03/31/21 0441 04/01/21 0329 04/02/21 0500 04/03/21 0500 04/04/21 0423  NA 129* 131* 136 129* 128*  K 4.0 3.9 4.3 4.5 4.2  CL 97* 98 103 98 97*  CO2 24 27 26 24 26   GLUCOSE 143* 146* 138* 100* 174*  BUN 24* 25* 27* 26* 25*  CREATININE 1.08 1.03 0.83 0.81 0.82  CALCIUM 8.0* 8.0* 7.9* 8.0* 8.0*  MG 2.1 2.1 2.2 2.1 1.8  PHOS 4.1 3.3 3.5 3.6 3.5   Liver Function Tests: Recent Labs  Lab 03/31/21 0441 04/03/21 0500 04/04/21 0423  AST 20 17 14*  ALT 29 22 21   ALKPHOS 75 65 65  BILITOT 0.8 0.3 0.3  PROT 5.7* 5.7* 5.4*  ALBUMIN 1.8* 1.8* 1.7*   No results for input(s): LIPASE, AMYLASE in the last 168 hours. No results for input(s): AMMONIA in the last 168 hours. CBC: Recent Labs  Lab 04/04/21 0423 04/05/21 0238  WBC 8.5 10.3  HGB 7.0* 9.0*  HCT 23.1* 28.7*  MCV 95.1 92.6  PLT 405* 369   Cardiac Enzymes: No results for input(s): CKTOTAL, CKMB, CKMBINDEX, TROPONINI in the last 168 hours. BNP (last 3 results) No results for  input(s): BNP in the last 8760 hours.  ProBNP (last 3 results) No results for input(s): PROBNP in the last 8760 hours.  CBG: Recent Labs  Lab 04/04/21 0757 04/04/21 1136 04/04/21 1628 04/05/21 0738 04/05/21 1129  GLUCAP 107* 114* 115* 121* 111*       Radiology Reports  No results found.     Antibiotics: Anti-infectives (From admission, onward)    Start     Dose/Rate Route Frequency Ordered Stop   03/30/21 1130  linezolid (ZYVOX) tablet 600 mg        600 mg Oral Every 12 hours 03/30/21 1043 04/03/21 2143   03/09/21 0800  piperacillin-tazobactam (ZOSYN) IVPB 3.375 g        3.375 g 12.5 mL/hr over 240 Minutes Intravenous Every 8 hours 03/09/21 0706 03/25/21 2034   02/26/21 0100  doxycycline (VIBRAMYCIN) 100 mg in sodium chloride 0.9 % 250 mL IVPB        100 mg 125 mL/hr over 120 Minutes Intravenous 2 times daily 02/26/21 0005 03/08/21 2359   02/20/21 0300   piperacillin-tazobactam (ZOSYN) IVPB 3.375 g        3.375 g 12.5 mL/hr over 240 Minutes Intravenous Every 8 hours 02/19/21 1907 03/08/21 2359   02/19/21 1900  ceFEPIme (MAXIPIME) 2 g in sodium chloride 0.9 % 100 mL IVPB  Status:  Discontinued        2 g 200 mL/hr over 30 Minutes Intravenous  Once 02/19/21 1857 02/19/21 1906   02/19/21 1900  metroNIDAZOLE (FLAGYL) IVPB 500 mg  Status:  Discontinued        500 mg 100 mL/hr over 60 Minutes Intravenous  Once 02/19/21 1857 02/19/21 1906   02/19/21 1815  piperacillin-tazobactam (ZOSYN) IVPB 3.375 g        3.375 g 100 mL/hr over 30 Minutes Intravenous  Once 02/19/21 1809 02/19/21 1944         DVT prophylaxis: Lovenox  Code Status: Full code  Family Communication: No family at bedside   Consultants:   Procedures:     Objective    Physical Examination:  General-appears in no acute distress Heart-S1-S2, regular, no murmur auscultated Lungs-clear to auscultation bilaterally, no wheezing or crackles auscultated Abdomen-soft, nontender, no organomegaly Extremities-no edema in the lower extremities Neuro-alert, oriented x3, no focal deficit noted  Status is: Inpatient  Dispo: The patient is from: Home              Anticipated d/c is to: [facility]              Anticipated d/c date is: 04/05/2021              Patient currently stable for discharge  Barrier to discharge-awaiting bed at [place]  No results for input(s): DDIMER, FERRITIN, LDH, CRP in the last 72 hours.  Lab Results  Component Value Date   [place] NEGATIVE 02/19/2021   [place] NEGATIVE 09/30/2019   [place] NEGATIVE 08/16/2019         Recent Results (from the past 240 hour(s))  Urine Culture     Status: Abnormal   Collection Time: 03/28/21  8:11 AM   Specimen: Urine, Clean Catch  Result Value Ref Range Status   Specimen Description URINE, CLEAN CATCH  Final   Special Requests   Final    NONE Performed at [facility], [place] [address] [place], [place] [address]    Culture (A)  Final    >=100,000 COLONIES/mL VANCOMYCIN RESISTANT ENTEROCOCCUS  Report Status 03/30/2021 FINAL  Final   Organism ID, Bacteria VANCOMYCIN RESISTANT ENTEROCOCCUS (A)  Final      Susceptibility   Vancomycin resistant enterococcus - MIC*    AMPICILLIN >=32 RESISTANT Resistant     NITROFURANTOIN 32 SENSITIVE Sensitive     VANCOMYCIN >=32 RESISTANT Resistant     LINEZOLID 2 SENSITIVE Sensitive     * >=100,000 COLONIES/mL VANCOMYCIN RESISTANT ENTEROCOCCUS    [place]   [facility] If 7PM-7AM, please contact night-coverage at [email or web address], Office  [phone]   04/05/2021, 3:53 PM  LOS: 44 days

## 2021-04-05 NOTE — Progress Notes (Signed)
Calorie Count Note  48 hour calorie count ordered.  Diet: Soft Supplements: Ensure Enlive TID, Magic Cup TID, ProSource Plus TID  Day 1 - 10/17: Breakfast: 40 kcal, 2 grams protein Lunch: 76 kcal, 4 grams protein Dinner: 34 kcal, 1 gram protein Supplements: 1050 kcal, 60 grams protein (3 Ensure Enlives)  Day 2 - 10/18: Breakfast: 110 kcal, 3 grams protein Supplements: 350 kcal, 20 grams protein (1 Ensure Enlive)  Total intake: 1660 kcal (62% of minimum estimated needs)  90 grams of protein (67% of minimum estimated needs)  Nutrition Dx: Inadequate oral intake related to acute illness (diverticulitis) as evidenced by per patient/family report, NPO status. - progressing  Goal: Patient will meet greater than or equal to 90% of their needs. - progressing  Intervention:  Continue 48-hr calorie count. Continue Ensure Enlive po TID, each supplement provides 350 kcal and 20 grams of protein Continue Magic cup TID with meals, each supplement provides 290 kcal and 9 grams of protein Continue PROSource PLUS PO 46mls TID, each supplement provides 100 kcals and 15 grams of protein Spoke with Pharmacy via phone - Pharmacist to cut TPN rate in half to begin weaning.  Derrel Nip, RD, LDN (she/her/hers) Registered Dietitian I After-Hours/Weekend Pager # in [place]

## 2021-04-05 NOTE — Plan of Care (Signed)
  Problem: Health Behavior/Discharge Planning: Goal: Ability to manage health-related needs will improve Outcome: Progressing   Problem: Clinical Measurements: Goal: Ability to maintain clinical measurements within normal limits will improve Outcome: Progressing Goal: Will remain free from infection Outcome: Progressing Goal: Diagnostic test results will improve Outcome: Progressing Goal: Respiratory complications will improve Outcome: Progressing Goal: Cardiovascular complication will be avoided Outcome: Progressing   Problem: Activity: Goal: Risk for activity intolerance will decrease Outcome: Progressing   Problem: Nutrition: Goal: Adequate nutrition will be maintained Outcome: Progressing   Problem: Coping: Goal: Level of anxiety will decrease Outcome: Progressing   Problem: Elimination: Goal: Will not experience complications related to bowel motility Outcome: Progressing Goal: Will not experience complications related to urinary retention Outcome: Progressing   Problem: Pain Managment: Goal: General experience of comfort will improve Outcome: Progressing   Problem: Skin Integrity: Goal: Risk for impaired skin integrity will decrease Outcome: Progressing   Problem: Activity: Goal: Ability to tolerate increased activity will improve Outcome: Progressing   Problem: Cardiac: Goal: Ability to achieve and maintain adequate cardiopulmonary perfusion will improve Outcome: Progressing

## 2021-04-06 ENCOUNTER — Other Ambulatory Visit: Payer: Self-pay

## 2021-04-06 ENCOUNTER — Inpatient Hospital Stay (HOSPITAL_COMMUNITY)
Admission: RE | Admit: 2021-04-06 | Discharge: 2021-04-21 | DRG: 945 | Disposition: A | Discharge status: Home Health | Payer: Medicare Other | Source: Intra-hospital | Attending: Physical Medicine and Rehabilitation | Admitting: Physical Medicine and Rehabilitation

## 2021-04-06 ENCOUNTER — Other Ambulatory Visit (HOSPITAL_COMMUNITY): Payer: Self-pay

## 2021-04-06 DIAGNOSIS — K5720 Diverticulitis of large intestine with perforation and abscess without bleeding: Secondary | ICD-10-CM | POA: Diagnosis not present

## 2021-04-06 DIAGNOSIS — L299 Pruritus, unspecified: Secondary | ICD-10-CM | POA: Diagnosis not present

## 2021-04-06 DIAGNOSIS — G4700 Insomnia, unspecified: Secondary | ICD-10-CM | POA: Diagnosis present

## 2021-04-06 DIAGNOSIS — G250 Essential tremor: Secondary | ICD-10-CM | POA: Diagnosis present

## 2021-04-06 DIAGNOSIS — R52 Pain, unspecified: Secondary | ICD-10-CM

## 2021-04-06 DIAGNOSIS — Z7901 Long term (current) use of anticoagulants: Secondary | ICD-10-CM

## 2021-04-06 DIAGNOSIS — R5381 Other malaise: Principal | ICD-10-CM | POA: Diagnosis present

## 2021-04-06 DIAGNOSIS — R21 Rash and other nonspecific skin eruption: Secondary | ICD-10-CM | POA: Diagnosis present

## 2021-04-06 DIAGNOSIS — I10 Essential (primary) hypertension: Secondary | ICD-10-CM

## 2021-04-06 DIAGNOSIS — Z9049 Acquired absence of other specified parts of digestive tract: Secondary | ICD-10-CM

## 2021-04-06 DIAGNOSIS — F17210 Nicotine dependence, cigarettes, uncomplicated: Secondary | ICD-10-CM | POA: Diagnosis present

## 2021-04-06 DIAGNOSIS — M170 Bilateral primary osteoarthritis of knee: Secondary | ICD-10-CM | POA: Diagnosis present

## 2021-04-06 DIAGNOSIS — D62 Acute posthemorrhagic anemia: Secondary | ICD-10-CM

## 2021-04-06 DIAGNOSIS — E871 Hypo-osmolality and hyponatremia: Secondary | ICD-10-CM

## 2021-04-06 DIAGNOSIS — Z888 Allergy status to other drugs, medicaments and biological substances status: Secondary | ICD-10-CM

## 2021-04-06 DIAGNOSIS — A419 Sepsis, unspecified organism: Secondary | ICD-10-CM | POA: Diagnosis not present

## 2021-04-06 DIAGNOSIS — Z716 Tobacco abuse counseling: Secondary | ICD-10-CM

## 2021-04-06 DIAGNOSIS — Z933 Colostomy status: Secondary | ICD-10-CM

## 2021-04-06 DIAGNOSIS — F4323 Adjustment disorder with mixed anxiety and depressed mood: Secondary | ICD-10-CM

## 2021-04-06 DIAGNOSIS — N390 Urinary tract infection, site not specified: Secondary | ICD-10-CM | POA: Diagnosis present

## 2021-04-06 DIAGNOSIS — E7800 Pure hypercholesterolemia, unspecified: Secondary | ICD-10-CM | POA: Diagnosis present

## 2021-04-06 DIAGNOSIS — R601 Generalized edema: Secondary | ICD-10-CM | POA: Diagnosis present

## 2021-04-06 DIAGNOSIS — Z1621 Resistance to vancomycin: Secondary | ICD-10-CM | POA: Diagnosis present

## 2021-04-06 DIAGNOSIS — B952 Enterococcus as the cause of diseases classified elsewhere: Secondary | ICD-10-CM | POA: Diagnosis present

## 2021-04-06 DIAGNOSIS — D72829 Elevated white blood cell count, unspecified: Secondary | ICD-10-CM | POA: Diagnosis not present

## 2021-04-06 DIAGNOSIS — I480 Paroxysmal atrial fibrillation: Secondary | ICD-10-CM | POA: Diagnosis present

## 2021-04-06 DIAGNOSIS — Z79899 Other long term (current) drug therapy: Secondary | ICD-10-CM

## 2021-04-06 DIAGNOSIS — K5900 Constipation, unspecified: Secondary | ICD-10-CM | POA: Diagnosis present

## 2021-04-06 DIAGNOSIS — R6520 Severe sepsis without septic shock: Secondary | ICD-10-CM | POA: Diagnosis not present

## 2021-04-06 DIAGNOSIS — M25461 Effusion, right knee: Secondary | ICD-10-CM | POA: Diagnosis not present

## 2021-04-06 DIAGNOSIS — E559 Vitamin D deficiency, unspecified: Secondary | ICD-10-CM | POA: Diagnosis present

## 2021-04-06 DIAGNOSIS — M7121 Synovial cyst of popliteal space [Baker], right knee: Secondary | ICD-10-CM | POA: Diagnosis present

## 2021-04-06 DIAGNOSIS — M25561 Pain in right knee: Secondary | ICD-10-CM | POA: Diagnosis not present

## 2021-04-06 HISTORY — DX: Sepsis, unspecified organism: A41.9

## 2021-04-06 LAB — GLUCOSE, CAPILLARY
Glucose-Capillary: 119 mg/dL — ABNORMAL HIGH (ref 70–99)
Glucose-Capillary: 135 mg/dL — ABNORMAL HIGH (ref 70–99)
Glucose-Capillary: 111 mg/dL — ABNORMAL HIGH (ref 70–99)
Glucose-Capillary: 141 mg/dL — ABNORMAL HIGH (ref 70–99)

## 2021-04-06 MED ORDER — DRONABINOL 2.5 MG PO CAPS
5.0000 mg | ORAL_CAPSULE | Freq: Two times a day (BID) | ORAL | Status: DC
  Administered 2021-04-06 – 2021-04-20 (×29): 5 mg via ORAL
  Filled 2021-04-06 (×29): qty 2

## 2021-04-06 MED ORDER — APIXABAN 5 MG PO TABS
5.0000 mg | ORAL_TABLET | Freq: Two times a day (BID) | ORAL | Status: DC
  Administered 2021-04-06 – 2021-04-21 (×30): 5 mg via ORAL
  Filled 2021-04-06 (×30): qty 1

## 2021-04-06 MED ORDER — LORAZEPAM 1 MG PO TABS
1.0000 mg | ORAL_TABLET | Freq: Four times a day (QID) | ORAL | Status: DC | PRN
  Administered 2021-04-07 – 2021-04-21 (×22): 1 mg via ORAL
  Filled 2021-04-06 (×22): qty 1

## 2021-04-06 MED ORDER — OXYCODONE HCL 5 MG PO TABS: 5.0000 mg | ORAL_TABLET | Freq: Four times a day (QID) | ORAL | 0 refills | Status: DC | PRN

## 2021-04-06 MED ORDER — LIDOCAINE 5 % EX PTCH
1.0000 | MEDICATED_PATCH | CUTANEOUS | Status: DC
  Administered 2021-04-07 – 2021-04-20 (×12): 1 via TRANSDERMAL
  Filled 2021-04-06 (×13): qty 1

## 2021-04-06 MED ORDER — FOLIC ACID 1 MG PO TABS: 1.0000 mg | ORAL_TABLET | Freq: Every day | ORAL | Status: DC

## 2021-04-06 MED ORDER — BENZOCAINE 10 % MT GEL
Freq: Four times a day (QID) | OROMUCOSAL | Status: DC | PRN
  Filled 2021-04-06: qty 9

## 2021-04-06 MED ORDER — DRONABINOL 5 MG PO CAPS: 5.0000 mg | ORAL_CAPSULE | Freq: Two times a day (BID) | ORAL | Status: DC

## 2021-04-06 MED ORDER — PANTOPRAZOLE SODIUM 40 MG PO TBEC: 40.0000 mg | DELAYED_RELEASE_TABLET | Freq: Two times a day (BID) | ORAL | Status: DC

## 2021-04-06 MED ORDER — DILTIAZEM HCL 60 MG PO TABS: 60.0000 mg | ORAL_TABLET | Freq: Four times a day (QID) | ORAL | Status: DC

## 2021-04-06 MED ORDER — METHOCARBAMOL 500 MG PO TABS: 1000.0000 mg | ORAL_TABLET | Freq: Four times a day (QID) | ORAL | Status: DC

## 2021-04-06 MED ORDER — APIXABAN 5 MG PO TABS: 5.0000 mg | ORAL_TABLET | Freq: Two times a day (BID) | ORAL | Status: DC

## 2021-04-06 MED ORDER — OXYCODONE HCL 5 MG PO TABS
5.0000 mg | ORAL_TABLET | Freq: Four times a day (QID) | ORAL | Status: DC | PRN
  Administered 2021-04-06 – 2021-04-19 (×23): 10 mg via ORAL
  Filled 2021-04-06 (×28): qty 2

## 2021-04-06 MED ORDER — NICOTINE 21 MG/24HR TD PT24
21.0000 mg | MEDICATED_PATCH | Freq: Every day | TRANSDERMAL | Status: DC
  Administered 2021-04-07 – 2021-04-17 (×11): 21 mg via TRANSDERMAL
  Filled 2021-04-06 (×11): qty 1

## 2021-04-06 MED ORDER — DOCUSATE SODIUM 100 MG PO CAPS: 100.0000 mg | ORAL_CAPSULE | Freq: Two times a day (BID) | ORAL | 0 refills | Status: DC

## 2021-04-06 MED ORDER — RAMELTEON 8 MG PO TABS
8.0000 mg | ORAL_TABLET | Freq: Every day | ORAL | Status: DC
  Administered 2021-04-06 – 2021-04-20 (×14): 8 mg via ORAL
  Filled 2021-04-06 (×16): qty 1

## 2021-04-06 MED ORDER — DOCUSATE SODIUM 100 MG PO CAPS: 100.0000 mg | ORAL_CAPSULE | Freq: Two times a day (BID) | ORAL | Status: DC

## 2021-04-06 MED ORDER — FUROSEMIDE 20 MG PO TABS
10.0000 mg | ORAL_TABLET | Freq: Every day | ORAL | Status: DC
  Administered 2021-04-07 – 2021-04-21 (×15): 10 mg via ORAL
  Filled 2021-04-06 (×15): qty 1

## 2021-04-06 MED ORDER — THIAMINE HCL 100 MG PO TABS: 100.0000 mg | ORAL_TABLET | Freq: Every day | ORAL | Status: DC

## 2021-04-06 MED ORDER — MAGNESIUM GLUCONATE 500 MG PO TABS
250.0000 mg | ORAL_TABLET | Freq: Every day | ORAL | Status: DC
  Administered 2021-04-06 – 2021-04-20 (×15): 250 mg via ORAL
  Filled 2021-04-06 (×15): qty 1

## 2021-04-06 MED ORDER — ADULT MULTIVITAMIN W/MINERALS CH: 1.0000 | ORAL_TABLET | Freq: Every day | ORAL | Status: DC

## 2021-04-06 MED ORDER — TRACE MINERALS CU-MN-SE-ZN 300-55-60-3000 MCG/ML IV SOLN
INTRAVENOUS | Status: DC
  Filled 2021-04-06: qty 338.43

## 2021-04-06 MED ORDER — PANTOPRAZOLE SODIUM 40 MG PO TBEC
40.0000 mg | DELAYED_RELEASE_TABLET | Freq: Two times a day (BID) | ORAL | Status: DC
  Administered 2021-04-06 – 2021-04-21 (×30): 40 mg via ORAL
  Filled 2021-04-06 (×30): qty 1

## 2021-04-06 MED ORDER — IPRATROPIUM-ALBUTEROL 0.5-2.5 (3) MG/3ML IN SOLN: 3.0000 mL | RESPIRATORY_TRACT | Status: DC | PRN

## 2021-04-06 MED ORDER — FUROSEMIDE 20 MG PO TABS: 20.0000 mg | ORAL_TABLET | Freq: Every day | ORAL | Status: DC

## 2021-04-06 MED ORDER — ENSURE ENLIVE PO LIQD
237.0000 mL | Freq: Three times a day (TID) | ORAL | Status: DC
  Administered 2021-04-06 – 2021-04-19 (×23): 237 mL via ORAL

## 2021-04-06 MED ORDER — DILTIAZEM HCL 60 MG PO TABS
60.0000 mg | ORAL_TABLET | Freq: Four times a day (QID) | ORAL | Status: DC
  Administered 2021-04-06 – 2021-04-21 (×58): 60 mg via ORAL
  Filled 2021-04-06 (×59): qty 1

## 2021-04-06 MED ORDER — PROSOURCE PLUS PO LIQD
30.0000 mL | Freq: Three times a day (TID) | ORAL | Status: DC
  Administered 2021-04-06 – 2021-04-18 (×17): 30 mL via ORAL
  Filled 2021-04-06 (×25): qty 30

## 2021-04-06 MED ORDER — MUSCLE RUB 10-15 % EX CREA
TOPICAL_CREAM | CUTANEOUS | Status: DC | PRN
  Filled 2021-04-06: qty 85

## 2021-04-06 MED ORDER — STERILE WATER FOR INJECTION IV SOLN
INTRAVENOUS | Status: DC
  Filled 2021-04-06: qty 338.87

## 2021-04-06 MED ORDER — NICOTINE 14 MG/24HR TD PT24: 14.0000 mg | MEDICATED_PATCH | Freq: Every day | TRANSDERMAL | Status: DC

## 2021-04-06 MED ORDER — METHOCARBAMOL 500 MG PO TABS
1000.0000 mg | ORAL_TABLET | Freq: Four times a day (QID) | ORAL | Status: DC
  Administered 2021-04-06 – 2021-04-21 (×59): 1000 mg via ORAL
  Filled 2021-04-06 (×59): qty 2

## 2021-04-06 MED ORDER — FUROSEMIDE 20 MG PO TABS
20.0000 mg | ORAL_TABLET | Freq: Every day | ORAL | Status: DC
Start: 2021-04-07 — End: 2021-04-21

## 2021-04-06 MED ORDER — INSULIN ASPART 100 UNIT/ML IJ SOLN
0.0000 [IU] | Freq: Three times a day (TID) | INTRAMUSCULAR | Status: DC
  Administered 2021-04-07 – 2021-04-16 (×13): 1 [IU] via SUBCUTANEOUS
  Administered 2021-04-16: 2 [IU] via SUBCUTANEOUS
  Administered 2021-04-17 – 2021-04-18 (×3): 1 [IU] via SUBCUTANEOUS
  Administered 2021-04-18: 3 [IU] via SUBCUTANEOUS
  Administered 2021-04-19 (×2): 1 [IU] via SUBCUTANEOUS
  Administered 2021-04-20 (×3): 2 [IU] via SUBCUTANEOUS
  Administered 2021-04-21: 1 [IU] via SUBCUTANEOUS

## 2021-04-06 MED ORDER — NITROGLYCERIN 0.4 MG SL SUBL: 0.4000 mg | SUBLINGUAL_TABLET | SUBLINGUAL | Status: DC | PRN

## 2021-04-06 MED ORDER — ACETAMINOPHEN 325 MG PO TABS
325.0000 mg | ORAL_TABLET | ORAL | Status: DC | PRN
  Administered 2021-04-08 – 2021-04-19 (×11): 650 mg via ORAL
  Filled 2021-04-06 (×13): qty 2

## 2021-04-06 MED ORDER — LIDOCAINE 5 % EX PTCH
1.0000 | MEDICATED_PATCH | CUTANEOUS | Status: DC
  Administered 2021-04-06: 1 via TRANSDERMAL
  Filled 2021-04-06: qty 1

## 2021-04-06 MED ORDER — LIDOCAINE 5 % EX PTCH: 1.0000 | MEDICATED_PATCH | CUTANEOUS | 0 refills | Status: DC

## 2021-04-06 MED ORDER — AMIODARONE HCL 200 MG PO TABS
200.0000 mg | ORAL_TABLET | Freq: Every day | ORAL | Status: DC
  Administered 2021-04-07 – 2021-04-21 (×15): 200 mg via ORAL
  Filled 2021-04-06 (×15): qty 1

## 2021-04-06 MED ORDER — THIAMINE HCL 100 MG PO TABS
100.0000 mg | ORAL_TABLET | Freq: Every day | ORAL | Status: DC
  Administered 2021-04-07 – 2021-04-21 (×15): 100 mg via ORAL
  Filled 2021-04-06 (×15): qty 1

## 2021-04-06 MED ORDER — FOLIC ACID 1 MG PO TABS
1.0000 mg | ORAL_TABLET | Freq: Every day | ORAL | Status: DC
  Administered 2021-04-07 – 2021-04-21 (×15): 1 mg via ORAL
  Filled 2021-04-06 (×15): qty 1

## 2021-04-06 MED ORDER — HYDROCORTISONE 1 % EX CREA
TOPICAL_CREAM | Freq: Two times a day (BID) | CUTANEOUS | Status: DC
  Filled 2021-04-06: qty 28

## 2021-04-06 MED ORDER — ACETAMINOPHEN 325 MG PO TABS: 650.0000 mg | ORAL_TABLET | Freq: Four times a day (QID) | ORAL | Status: DC

## 2021-04-06 MED ORDER — STERILE WATER FOR INJECTION IV SOLN
INTRAVENOUS | Status: AC
  Filled 2021-04-06: qty 338.87

## 2021-04-06 MED ORDER — AMIODARONE HCL 200 MG PO TABS: 200.0000 mg | ORAL_TABLET | Freq: Every day | ORAL | Status: DC

## 2021-04-06 NOTE — Progress Notes (Signed)
Izora Ribas, MD   Physician  Physical Medicine and Rehabilitation  PMR Pre-admission     Signed  Date of Service:  03/30/2021  9:09 AM       Related encounter: ED to Hosp-Admission (Discharged) from 02/19/2021 in [facility]       Signed      Show:Clear all _0 Written_1 Templated_2 Copied  Added by: _3 Cristina Gong, RN_4 Ranell Patrick Clide Deutscher, MD  _5 Hover for details                                                                                                                                                                                                                                                                                                                                                                                                                                                 PMR Admission Coordinator Pre-Admission Assessment   Patient: Travis Conrad is an 70 y.o., male MRN: [id number] DOB: Aug 31, 1950 Height: 6' (182.9 cm) Weight: 93.4 kg   Insurance Information HMO: yes    PPO:      PCP:      IPA:      80/20:      OTHER:  PRIMARY: Blue Medicare      Policy#: [id number]      Subscriber: pt CM Name: Larene Beach      Phone#: [phone]  Fax#: [phone] Pre-Cert#: TBD   f/u in 7 days   Employer:  Benefits:  Phone #: [phone]     Name: 10/12 Eff. Date: 06/19/2020     Deduct: none      Out of Pocket Max: $3900      Life Max: none CIR: $335 co pay per day days 1 until 6      SNF: no copay days 1 until 20; $188 co pay per day days 21 until 60; no copay days 61 until 100 Outpatient: $40 copay per visit     Co-Pay: visits per medical neccesity Home Health: 100%      Co-Pay: visits per medical neccesity DME: 80%     Co-Pay: 20% Providers: in network  SECONDARY: none   Financial  Counselor:       Phone#:    [employer] Engineer, [employer]" for patients in Inpatient Rehabilitation Facilities with attached "Privacy Act [place] Records" was provided and verbally reviewed with: Patient and Family   Emergency Contact Information Contact Information       Name Relation Home Work Mobile    [place]     [phone]         Current Medical History  Patient Admitting Diagnosis: Debility after sepsis due to perforated diverticulitis   History of Present Illness:  70 year old right-handed male with history of PAF status post ablation 10/03/2019 followed by Dr. [name] maintained on Eliquis, hyperlipidemia, hypertension, tobacco/alcohol use, benign essential tremors, prior diverticulitis.  Presented 02/20/2021 with increasing abdominal pain nausea and vomiting times several days as well as decrease in appetite and decreased urination.  On presentation to the ED he was mildly hypotensive with systolic blood pressure in the 70s.  Noted WBC of 21,000 as well as AKI with creatinine 3.2, lipase 22, lactic acid 1.3.  CT of abdomen pelvis showed inflammation of the sigmoid colon and a small amount of extraluminal gas, consistent with perforated diverticulitis and abscess.  He was started on Zosyn in the ED as well as IV fluids and low-dose Levophed.  A nasogastric tube for gastric decompression.  Close monitoring of blood pressure as well as history of PAF requiring intravenous amiodarone with echocardiogram completed 02/23/2021 showing ejection fraction of 60 to 65%.  Patient was seen in consultation by interventional radiology 02/24/2021 for aspiration and drain placement of intra-abdominal fluid collection per Dr.Jon Mugweru and later underwent exploratory laparotomy, sigmoid colectomy with end colostomy creation drainage of intra-abdominal abscess with wound VAC placement 02/26/2021 per surgery Dr.  Kieth Brightly.  Hospital course complicated by bouts of delirium  developed postoperative anemia required 1 unit packed red blood cells with latest hemoglobin of 7 awaiting plan for transfusion.  Patient was cleared to resume Eliquis as prior to admission for PAF.  Urology consulted 03/02/2021 due to right scrotal pain with scrotal ultrasound demonstrating findings consistent with right epididymitis and a reactive complex hydrocele.  Developed VRE UTI placed on Zyvox through 04/04/2021 and maintained on contact precautions.   Developed left knee pain findings of positive effusion underwent left knee aspiration  injection with good toleration, fluid showed no crystals, gram stain with no organisms.  In regards to patient's AKI initially maintained on TPN renal function much improved latest creatinine 0.8 to monitoring of sodium 128.  Patient is currently tolerating a mechanical soft diet but with poor po intake, monitoring with calorie count.   Patient's medical record from [facility] has been reviewed by the rehabilitation admission coordinator and physician.  Past Medical History      Past Medical History:  Diagnosis Date   Atrial fibrillation ([place])     Dysrhythmia      a-fib   High cholesterol      "RX made groin hurt" (08/14/2016)   Hypertension      Has the patient had major surgery during 100 days prior to admission? Yes   Family History   family history includes Other in his brother.   Current Medications   Current Facility-Administered Medications:    (feeding supplement) PROSource Plus liquid 30 mL, 30 mL, Oral, TID BM, Elgergawy, Silver Huguenin, MD, 30 mL at 04/06/21 0843   0.9 %  sodium chloride infusion, , Intravenous, PRN, Kinsinger, Arta Bruce, MD, Stopped at 03/24/21 2259   acetaminophen (TYLENOL) tablet 650 mg, 650 mg, Oral, Q6H, Norm Parcel, PA-C, 650 mg at 04/06/21 0449   amiodarone (PACERONE) tablet 200 mg, 200 mg, Oral, Daily, Elgergawy, Silver Huguenin, MD, 200 mg at 04/06/21 0845   apixaban (ELIQUIS) tablet 5 mg, 5 mg, Oral, BID,  Hammons, Kimberly B, RPH, 5 mg at 04/06/21 0843   benzocaine (ORAJEL) 10 % mucosal gel, , Mouth/Throat, Q6H PRN, Elgergawy, Silver Huguenin, MD, Given at 03/29/21 1120   Chlorhexidine Gluconate Cloth 2 % PADS 6 each, 6 each, Topical, Daily, Kinsinger, Arta Bruce, MD, 6 each at 04/06/21 1012   diltiazem (CARDIZEM) tablet 60 mg, 60 mg, Oral, Q6H, Hammons, Kimberly B, RPH, 60 mg at 04/06/21 0452   docusate sodium (COLACE) capsule 100 mg, 100 mg, Oral, BID, Norm Parcel, PA-C, 100 mg at 04/06/21 0845   dronabinol (MARINOL) capsule 5 mg, 5 mg, Oral, BID AC, Norm Parcel, PA-C, 5 mg at 04/05/21 1737   feeding supplement (ENSURE ENLIVE / ENSURE PLUS) liquid 237 mL, 237 mL, Oral, TID BM, Elgergawy, Silver Huguenin, MD, 237 mL at 28/41/32 4401   folic acid (FOLVITE) tablet 1 mg, 1 mg, Oral, Daily, Karren Cobble, RPH, 1 mg at 04/06/21 0845   furosemide (LASIX) tablet 20 mg, 20 mg, Oral, Daily, Elgergawy, Silver Huguenin, MD, 20 mg at 04/06/21 0845   hydrALAZINE (APRESOLINE) tablet 25 mg, 25 mg, Oral, Q6H PRN, Darrick Meigs, Marge Duncans, MD   hydrocortisone cream 1 %, , Topical, BID, Darrick Meigs, Marge Duncans, MD, Given at 04/05/21 2034   insulin aspart (novoLOG) injection 0-9 Units, 0-9 Units, Subcutaneous, TID WC, Donnamae Jude, RPH, 1 Units at 04/03/21 1222   ipratropium-albuterol (DUONEB) 0.5-2.5 (3) MG/3ML nebulizer solution 3 mL, 3 mL, Nebulization, Q4H PRN, Pahwani, Ravi, MD, 3 mL at 04/05/21 1416   lidocaine (LIDODERM) 5 % 1 patch, 1 patch, Transdermal, Q24H, Vann, Jessica U, DO, 1 patch at 04/06/21 0950   LORazepam (ATIVAN) tablet 1 mg, 1 mg, Oral, Q6H PRN, Chotiner, Yevonne Aline, MD, 1 mg at 04/05/21 1153   methocarbamol (ROBAXIN) tablet 1,000 mg, 1,000 mg, Oral, QID, Norm Parcel, PA-C, 1,000 mg at 04/06/21 0844   morphine 2 MG/ML injection 1 mg, 1 mg, Intravenous, Q4H PRN, Norm Parcel, PA-C, 1 mg at 04/06/21 0853   [START ON 04/07/2021] multivitamin with minerals tablet 1 tablet, 1 tablet, Oral, Daily, Reome, Earle J,  RPH   Muscle Rub CREA, , Topical, PRN, Elgergawy, Silver Huguenin, MD, Given at 04/05/21 2030   nicotine (NICODERM CQ - dosed in mg/24 hours) patch 21 mg, 21 mg, Transdermal, Daily, Kinsinger, Arta Bruce, MD, 21 mg at 04/06/21 0844   nitroGLYCERIN (NITROSTAT) SL tablet 0.4 mg, 0.4 mg, Sublingual, Q5  min PRN, Elgergawy, Silver Huguenin, MD   [DISCONTINUED] ondansetron (ZOFRAN) tablet 4 mg, 4 mg, Oral, Q6H PRN **OR** ondansetron (ZOFRAN) injection 4 mg, 4 mg, Intravenous, Q6H PRN, Kinsinger, Arta Bruce, MD, 4 mg at 04/05/21 0408   oxyCODONE (Oxy IR/ROXICODONE) immediate release tablet 5-10 mg, 5-10 mg, Oral, Q6H PRN, Norm Parcel, PA-C, 10 mg at 04/05/21 0427   pantoprazole (PROTONIX) EC tablet 40 mg, 40 mg, Oral, BID, Hammons, Kimberly B, RPH, 40 mg at 04/06/21 0844   sodium chloride flush (NS) 0.9 % injection 10 mL, 10 mL, Intracatheter, Q12H, Mugweru, Jon, MD, 10 mL at 04/06/21 2482   thiamine tablet 100 mg, 100 mg, Oral, Daily, Karren Cobble, RPH, 100 mg at 50/03/70 4888   TPN CYCLIC-ADULT (ION), , Intravenous, Cyclic-See Admin Instructions, Reome, Earle J, RPH   zolpidem (AMBIEN) tablet 5 mg, 5 mg, Oral, QHS PRN, Elgergawy, Silver Huguenin, MD, 5 mg at 04/05/21 2313   Patients Current Diet:  Diet Order                  DIET SOFT Room service appropriate? No; Fluid consistency: Thin  Diet effective now                       Precautions / Restrictions Precautions Precautions: Fall Precaution Comments: abdominal incision, watch HR and BP Restrictions Weight Bearing Restrictions: No    Has the patient had 2 or more falls or a fall with injury in the past year? No   Prior Activity Level Community (5-7x/wk): Independent and driving   Prior Functional Level Self Care: Did the patient need help bathing, dressing, using the toilet or eating? Independent   Indoor Mobility: Did the patient need assistance with walking from room to room (with or without device)? Independent   Stairs: Did the  patient need assistance with internal or external stairs (with or without device)? Independent   Functional Cognition: Did the patient need help planning regular tasks such as shopping or remembering to take medications? Independent   Patient Information Are you of Hispanic, Latino/a,or Spanish origin?: A. No, not of Hispanic, Latino/a, or Spanish origin What is your race?: A. White Do you need or want an interpreter to communicate with a doctor or health care staff?: 0. No   Patient's Response To:  Health Literacy and Transportation Is the patient able to respond to health literacy and transportation needs?: Yes Health Literacy - How often do you need to have someone help you when you read instructions, pamphlets, or other written material from your doctor or pharmacy?: Never In the past 12 months, has lack of transportation kept you from medical appointments or from getting medications?: No In the past 12 months, has lack of transportation kept you from meetings, work, or from getting things needed for daily living?: No   Home Assistive Devices / [place] Devices/Equipment: Radio producer (specify quad or straight) (doesn't use cane at home) Home Equipment: Environmental consultant - 2 wheels, [place] - single point, Systems analyst, Shower seat - built in, Bedside commode   Prior Device Use: Indicate devices/aids used by the patient prior to current illness, exacerbation or injury? None of the above   Current Functional Level Cognition   Overall Cognitive Status: Impaired/Different from baseline Current Attention Level: Selective Orientation Level: Oriented X4 Following Commands: Follows one step commands consistently Safety/Judgement: Decreased awareness of deficits, Decreased awareness of safety General Comments: requires encouragement to stay out of bed    Extremity  Assessment (includes Sensation/Coordination)   Upper Extremity Assessment: Defer to OT evaluation LUE Deficits / Details: Pt  with slight tremor with movement. LUE Sensation: WNL LUE Coordination: decreased fine motor  Lower Extremity Assessment: Defer to PT evaluation     ADLs   Overall ADL's : Needs assistance/impaired Eating/Feeding: Minimal assistance, Sitting Eating/Feeding Details (indicate cue type and reason): Pt states his tremor keeps him from feeding self and he likes wife to feed him. Pt able to drink from cup with LUE without issue. Pt should be able to feed self. Encouraged wife and pt to allow for his independence. Will provide adaptive equipment if needed but do not feel it is needed a this time. Grooming: Wash/dry face, Wash/dry hands, Brushing hair, Standing, Min guard Grooming Details (indicate cue type and reason): performed standing at sink Upper Body Bathing: Set up, Supervision/ safety, Sitting Upper Body Bathing Details (indicate cue type and reason): performed sitting on eob Upper Body Dressing : Minimal assistance, Bed level Upper Body Dressing Details (indicate cue type and reason): not assessed, however, will infer min guard due to pain limited engagement. Lower Body Dressing: Moderate assistance, Cueing for compensatory techniques, Sit to/from stand Lower Body Dressing Details (indicate cue type and reason): Pt sat figure 4 today with only min assist to cross [name] and donned and doffed socks.  Pt stood with min assist to manage LE clothing. Toilet Transfer: Min guard, Buyer, retail Details (indicate cue type and reason): stand pivot transfer from EOB to recliner;pt with ostomy and condom catheter Toileting- Clothing Manipulation and Hygiene: Moderate assistance Toileting - Clothing Manipulation Details (indicate cue type and reason): modA to wash bottom while standing.  Pt has difficulty letting go of walker to clean self in standing. Functional mobility during ADLs: Minimal assistance, Rolling walker General ADL Comments: required seated rest break following standing at sink  for groomng     Mobility   Overal bed mobility: Modified Independent Bed Mobility: Sit to Supine, Supine to Sit Rolling: Min guard Sidelying to sit: Min guard, HOB elevated Supine to sit: Min guard Sit to supine: Min guard Sit to sidelying: Mod assist General bed mobility comments: able to get to eob with verbal cues to intiate     Transfers   Overall transfer level: Needs assistance Equipment used: Rolling walker (2 wheeled) Transfers: Sit to/from Stand Sit to Stand: Min guard Stand pivot transfers: Min guard General transfer comment: used rw for transfers     Ambulation / Gait / Stairs / Emergency planning/management officer   Ambulation/Gait Ambulation/Gait assistance: Counsellor (Feet): 110 Feet Assistive device: Rolling walker (2 wheeled) Gait Pattern/deviations: Step-through pattern, Trunk flexed General Gait Details: cue for proximity to RW and encouragement for increased distance Gait velocity: Decreased Gait velocity interpretation: <1.31 ft/sec, indicative of household ambulator     Posture / Balance Dynamic Sitting Balance Sitting balance - Comments: able to sit EOB without support Balance Overall balance assessment: Needs assistance Sitting-balance support: No upper extremity supported Sitting balance-Leahy Scale: Good Sitting balance - Comments: able to sit EOB without support Standing balance support: Single extremity supported, During functional activity Standing balance-Leahy Scale: Poor Standing balance comment: able to stand at sink for grooming     Special needs/care consideration New colostomy this admission Cyclic TNA Calorie Count                      Show:Clear all _0 Written_1 Templated_2 Copied   Added by: _3 Salley Slaughter, RN   _4 Hover  for details                                           [place] Nurse wound follow up Patient receiving care in [facility] [address], PA-C present to evaluate wound. NPWT discontinued today.  Wound  type: Surgical midline incision Wound bed: Clean, pink granulation tissue Measurements: 10.2 cm x 3.5 cm x 0.8 cm Drainage (amount, consistency, odor) Sanguinous in canister Periwound: intact Dressing procedure/placement/frequency: One piece of black foam dressing removed.  Placed a saline moistened 4 x 4 into the wound and covered with dry 4 x 4 and ABD pad. Change daily   WOC Nurse ostomy follow up Stoma type/location: LUQ colostomy Stomal assessment/size: 1 1/4" oval red budded just above the level of the skin. Sutures in place.  Peristomal assessment: intact Treatment options for stomal/peristomal skin: Barrier ring Output: emptied 125 cc of dark green/black stool, not as sticky as previously. Ostomy pouching: 1pc. convex Education provided: None today. Pouch will be due for change on Thurs or Friday of this week. Allow spouse to empty and change pouch.  Enrolled patient in [place] Start Discharge program: Yes (previously) WOC is signing off but will be available to this patient if needed. Please re-consult.    Cathlean Marseilles Tamala Julian, MSN, RN, CMSRN, AGCNS, [facility] Wound Treatment Associate Pager [phone]       Previous Home Environment  Living Arrangements: Spouse/significant other  Lives With: Spouse Available Help at Discharge: Family, Available 24 hours/day Type of Home: House Home Layout: One level Home Access: Stairs to enter Entrance Stairs-Rails: Right Entrance Stairs-Number of Steps: 3 Bathroom Shower/Tub: Multimedia programmer: Standard Bathroom Accessibility: Yes How Accessible: Accessible via walker [place]: No Additional Comments: wife reports being avaible 24/7 to assist   Discharge Living Setting Plans for Discharge Living Setting: Patient's home, Lives with (comment) (wife) Type of Home at Discharge: House Discharge Home Layout: One level Discharge Home Access: Stairs to enter Entrance Stairs-Rails: Right Entrance Stairs-Number  of Steps: 3 Discharge Bathroom Shower/Tub: Walk-in shower Discharge Bathroom Toilet: Standard Discharge Bathroom Accessibility: Yes How Accessible: Accessible via walker Does the patient have any problems obtaining your medications?: No   Social/Family/Support Systems Patient Roles: Spouse Contact Information: [place], spouse Anticipated Caregiver: wife Anticipated Caregiver's Contact Information: see contacts Ability/Limitations of Caregiver: none Caregiver Availability: 24/7 Discharge Plan Discussed with Primary Caregiver: Yes Is Caregiver In Agreement with Plan?: Yes Does Caregiver/Family have Issues with Lodging/Transportation while Pt is in Rehab?: No   Goals Patient/Family Goal for Rehab: supervision to min assist with PT and OT Expected length of stay: ELOS 10 to 12 days Pt/Family Agrees to Admission and willing to participate: Yes Program Orientation Provided & Reviewed with Pt/Caregiver Including Roles  & Responsibilities: Yes   Decrease burden of Care through IP rehab admission: n/a   Possible need for SNF placement upon discharge: not anticipated   Patient Condition: I have reviewed medical records from [facility], spoken with [place], and patient and spouse. I met with patient at the bedside for inpatient rehabilitation assessment.  Patient will benefit from ongoing PT and OT, can actively participate in 3 hours of therapy a day 5 days of the week, and can make measurable gains during the admission.  Patient will also benefit from the coordinated team approach during an Inpatient Acute Rehabilitation admission.  The patient will receive  intensive therapy as well as Rehabilitation physician, nursing, social worker, and care management interventions.  Due to bladder management, bowel management, safety, skin/wound care, disease management, medication administration, pain management, and patient education the patient requires 24 hour a day rehabilitation nursing.  The patient  is currently min to mod assist overall with mobility and basic ADLs.  Discharge setting and therapy post discharge at home with home health is anticipated.  Patient has agreed to participate in the Acute Inpatient Rehabilitation Program and will admit today.   Preadmission Screen Completed [id number] Saraann Enneking RN MSN with updates by Julious Payer, Audelia Acton, 04/06/2021 10:53 AM ______________________________________________________________________   Discussed status with Dr. Ranell Patrick on 04/06/2021 at 1054 and received approval for admission today.   Admission Coordinator:  Cleatrice Burke, RN, time 2761 Date 04/06/2021    Assessment/Plan: Diagnosis: Debility Does the need for close, 24 hr/day Medical supervision in concert with the patient's rehab needs make it unreasonable for this patient to be served in a less intensive setting? Yes Co-Morbidities requiring supervision/potential complications: AKI, VRE UTI, left knee effusion, right epididymitis, reactive complex hydrocele Due to bladder management, bowel management, safety, skin/wound care, disease management, medication administration, pain management, and patient education, does the patient require 24 hr/day rehab nursing? Yes Does the patient require coordinated care of a physician, rehab nurse, PT, OT, SLP and  to address physical and functional deficits in the context of the above medical diagnosis(es)? Yes Addressing deficits in the following areas: balance, endurance, locomotion, strength, transferring, bowel/bladder control, bathing, dressing, feeding, grooming, toileting, cognition, and psychosocial support Can the patient actively participate in an intensive therapy program of at least 3 hrs of therapy 5 days a week? Yes The potential for patient to make measurable gains while on inpatient rehab is excellent Anticipated functional outcomes upon discharge from inpatient rehab: supervision PT, supervision OT, supervision  SLP Estimated rehab length of stay to reach the above functional goals is: 5-7 days Anticipated discharge destination: Home 10. Overall Rehab/Functional Prognosis: excellent     MD Signature: Leeroy Cha, MD          Revision History                                    Note Details  Author Izora Ribas, MD File Time 04/06/2021 11:51 AM  Author Type Physician Status Signed  Last Editor Izora Ribas, MD Service Physical Medicine and [place] # [id number] Admit Date 04/06/2021

## 2021-04-06 NOTE — Progress Notes (Signed)
Inpatient Rehabilitation Admission Medication Review by a Pharmacist  A complete drug regimen review was completed for this patient to identify any potential clinically significant medication issues.  High Risk Drug Classes Is patient taking? Indication by Medication  Antipsychotic No   Anticoagulant Yes Apixaban: VTE ppx  Antibiotic No   Opioid Yes, as an intravenous medication Morphine: PRN pain control Oxycodone: PRN pain control  Antiplatelet No   Hypoglycemics/insulin Yes Insulin aspart: hyperglycemic control Insulin regular: TPN - hyperglycemic control  Vasoactive Medication Yes Amiodarone: Afib control Diltiazem: Afib control  Chemotherapy No   Other Yes Acetaminophen: pain control Dronabinol: appetite stimulant Folic acid: nutrition/supplementation Furosemide: HF, edema treatment Methocarbamol: muscle spasms Pantoprazole: GERD ppx Thiamine: nutrition/supplementation TPN: nutrition Zolpidem: PRN insomnia     Type of Medication Issue Identified Description of Issue Recommendation(s)  Drug Interaction(s) (clinically significant)     Duplicate Therapy     Allergy     No Medication Administration End Date     Incorrect Dose     Additional Drug Therapy Needed     Significant med changes from prior encounter (inform family/care partners about these prior to discharge).    Other       Clinically significant medication issues were identified that warrant physician communication and completion of prescribed/recommended actions by midnight of the next day:  No  Pharmacist comments:  - Sotalol, rosuvastatin, losartan, HCTZ, and Linzess had been held during prior admission and were not addressed at current discharge to CIR, can consider resuming during CIR admission if appropriate  Time spent performing this drug regimen review (minutes):  20 minutes   Thank you for allowing pharmacy to be a part of this patient's care.  Ardyth Harps, PharmD Clinical  Pharmacist

## 2021-04-06 NOTE — Progress Notes (Signed)
Report called to Pearline Cables, RN for patient to transfer to CIR [phone].

## 2021-04-06 NOTE — Progress Notes (Addendum)
Attestation: I personally saw the patient and performed a substantive portion of this encounter, including a complete performance of at least one of the key components (MDM, Hx and/or Exam), in conjunction with the Advanced Practice Provider Richard Miu, PA-C.  Ready for discharge.  Felicie Morn, MD    Progress Note  39 Days Post-Op  Subjective: No new complaints.  Still with low appetite. Good bowel function.  Objective: Vital signs in last 24 hours: Temp:  [98.1 F (36.7 C)-98.3 F (36.8 C)] 98.3 F (36.8 C) (10/19 0449) Pulse Rate:  [87-96] 91 (10/19 0449) Resp:  [17-20] 17 (10/19 0449) BP: (117-133)/(54-65) 125/65 (10/19 0449) SpO2:  [95 %-98 %] 97 % (10/19 0449) Last BM Date: 04/04/21  Intake/Output from previous day: 10/18 0701 - 10/19 0700 In: 720 [P.O.:720] Out: 3900 [Urine:3750; Stool:150] Intake/Output this shift: No intake/output data recorded.  PE: Gen:  Alert, NAD, pleasant Abd: Soft, ND, NT, +BS. Stoma appears viable with gas/stool present in ostomy appliance. Midline dressing clean and intact with beefy red granulation tissue   Lab Results:  Recent Labs    04/04/21 0423 04/05/21 0238  WBC 8.5 10.3  HGB 7.0* 9.0*  HCT 23.1* 28.7*  PLT 405* 369    BMET Recent Labs    04/04/21 0423  NA 128*  K 4.2  CL 97*  CO2 26  GLUCOSE 174*  BUN 25*  CREATININE 0.82  CALCIUM 8.0*    PT/INR No results for input(s): LABPROT, INR in the last 72 hours. CMP     Component Value Date/Time   NA 128 (L) 04/04/2021 0423   NA 134 09/30/2019 0959   NA 142 03/03/2016 0921   K 4.2 04/04/2021 0423   K 4.3 03/03/2016 0921   CL 97 (L) 04/04/2021 0423   CO2 26 04/04/2021 0423   CO2 25 03/03/2016 0921   GLUCOSE 174 (H) 04/04/2021 0423   GLUCOSE 89 03/03/2016 0921   BUN 25 (H) 04/04/2021 0423   BUN 11 09/30/2019 0959   BUN 13.6 03/03/2016 0921   CREATININE 0.82 04/04/2021 0423   CREATININE 1.2 03/03/2016 0921   CALCIUM 8.0 (L) 04/04/2021  0423   CALCIUM 9.6 03/03/2016 0921   PROT 5.4 (L) 04/04/2021 0423   PROT 7.3 03/03/2016 0921   ALBUMIN 1.7 (L) 04/04/2021 0423   ALBUMIN 3.8 03/03/2016 0921   AST 14 (L) 04/04/2021 0423   AST 36 (H) 03/03/2016 0921   ALT 21 04/04/2021 0423   ALT 31 03/03/2016 0921   ALKPHOS 65 04/04/2021 0423   ALKPHOS 116 03/03/2016 0921   BILITOT 0.3 04/04/2021 0423   BILITOT 0.71 03/03/2016 0921   GFRNONAA >60 04/04/2021 0423   GFRNONAA 67 07/25/2013 1712   GFRAA >60 10/03/2019 0930   GFRAA 77 07/25/2013 1712   Lipase     Component Value Date/Time   LIPASE 22 02/19/2021 1653       Studies/Results: No results found.  Anti-infectives: Anti-infectives (From admission, onward)    Start     Dose/Rate Route Frequency Ordered Stop   03/30/21 1130  linezolid (ZYVOX) tablet 600 mg        600 mg Oral Every 12 hours 03/30/21 1043 04/03/21 2143   03/09/21 0800  piperacillin-tazobactam (ZOSYN) IVPB 3.375 g        3.375 g 12.5 mL/hr over 240 Minutes Intravenous Every 8 hours 03/09/21 0706 03/25/21 2034   02/26/21 0100  doxycycline (VIBRAMYCIN) 100 mg in sodium chloride 0.9 % 250 mL IVPB  100 mg 125 mL/hr over 120 Minutes Intravenous 2 times daily 02/26/21 0005 03/08/21 2359   02/20/21 0300  piperacillin-tazobactam (ZOSYN) IVPB 3.375 g        3.375 g 12.5 mL/hr over 240 Minutes Intravenous Every 8 hours 02/19/21 1907 03/08/21 2359   02/19/21 1900  ceFEPIme (MAXIPIME) 2 g in sodium chloride 0.9 % 100 mL IVPB  Status:  Discontinued        2 g 200 mL/hr over 30 Minutes Intravenous  Once 02/19/21 1857 02/19/21 1906   02/19/21 1900  metroNIDAZOLE (FLAGYL) IVPB 500 mg  Status:  Discontinued        500 mg 100 mL/hr over 60 Minutes Intravenous  Once 02/19/21 1857 02/19/21 1906   02/19/21 1815  piperacillin-tazobactam (ZOSYN) IVPB 3.375 g        3.375 g 100 mL/hr over 30 Minutes Intravenous  Once 02/19/21 1809 02/19/21 1944        Assessment/Plan POD39, s/p ex lap, sigmoid colectomy, end  colostomy with enterorrhaphy and drainage of intraabdominal abscess - Dr. Kieth Brightly and Dr. Radene Knee 9/10 for Acute sigmoid diverticulitis with contained perforation and abscess - CT 9/16 demonstrates heterogenous left iliopsoas collection likely hematoma, ID following and recommending abx through 10/17  - Was started on reglan 9/20. Weaned yesterday and dc today - CT 10/4 with stable psoas collection - Continue to encourage PO, transition away from TPN as caloric intake allows.  Calorie count in process - WOCN following for new ostomy teaching, daily WTD to midline wound  - Encouraged ambulation (working with therapies), use IS, multimodal pain control   -surgically ready for inpatient rehab   FEN - soft diet, ensure, cyclic TPN VTE - SCDs, eliquis ID - Zosyn 9/4 - 10/12; PO Linezolid 10/12>>10/17   Per primary: VRE UTI - linezolid per ID  A fib - cards following  AKI - resolved ABL anemia -  stable HTN ETOH use L knee pain - per ortho  LOS: 45 days    Winferd Humphrey, [facility] 04/06/2021, 9:18 AM Please see Amion for pager number during day hours 7:00am-4:30pm

## 2021-04-06 NOTE — Progress Notes (Signed)
Patient transferred to CIR via WC at this time.  All personal belongings taken on cart and wife at patient's side.

## 2021-04-06 NOTE — Progress Notes (Signed)
Patient ID: Travis Conrad, male   DOB: August 24, 1950, 70 y.o.   MRN: [id number] Met with the patient and his wife to introduce self and the role of the nurse care manager. Reviewed history and secondary risk including diverticulitis, HTN, HLD and prediabetes. Discussed skin care/ostomy care. Patient's wife noted she had been practicing with ostomy care. Also reviewed medications and dietary modifications recommended. Continue to follow along to discharge to address educational needs and review skin care and ostomy care with the patient and wife. Collaborate with the team to facilitate preparation for discharge and smooth transition to the home. Margarito Liner

## 2021-04-06 NOTE — Progress Notes (Signed)
PHARMACY - TOTAL PARENTERAL NUTRITION CONSULT NOTE  Indication: Prolonged ileus  Patient Measurements: Height: 6' (182.9 cm) Weight: 93.4 kg (206 lb) IBW/kg (Calculated) : 77.6 TPN AdjBW (KG): 82.6 Body mass index is 27.94 kg/m.  Assessment:  65 YOM with lower abdominal pain/N/V found to have perforated diverticulitis. CT abd on 9/3 found to have active ileus/pSBO from diverticulitis/bowel inflammation. Pharmacy consulted to manage TPN for prolonged ileus. Of note patient has a history pf alcohol dependency and is at high risk for refeeding.   Glucose / Insulin: no hx DM, A1c 5.9% - CBGs controlled, running in lower 100s. Off steroids 10/5. Utilized 0 units SSI in past 24 hrs + 25 units insulin in TPN Electrolytes: Na 136>128, Mg down some to 1.7 (high UOP) Renal: Scr <1, BUN 25 stable. Lasix 20mg  PO daily Hepatic: LFTs / Tbili  WNL, TG 159, albumin 1.7  Intake / Output; MIVF: UOP 1.7 ml/kg/hr on Lasix, net -34.2L per charting - LBM 10/18, po intake 720 charted (soft diet) GI Imaging: - 9/3 CT abd: active ileus/pSBO - 9/16 CT abd: L-iliopsoas fluid collection (abscess or hematoma), resolution of prior diverticular abscess, still w/ evidence of ileus - 9/27 CT abd: continued retroperitoneal complex fluid collection, SBO - 10/4 CT: Left iliopsoas enlargement heterogeneity persists > may represent intramuscular hematoma, infection or mass.  Ieus, degree of small-bowel distention has decreased, pSBO cannot be excluded. - 10/7 KUB: Dilated loops of small bowel are slightly more prominent than on the prior study. Gas is noted within the colon.  GI Surgeries / Procedures: - 9/10 ex-lap, sigmoid colectomy w/ end colostomy creation, enterorrhaphy, drainage of intra-abd abscess, wound vac placement  Central access: PICC 02/24/21 TPN start date: 02/25/21  Nutritional Goals, RD Estimated Needs Total Energy Estimated Needs: 2000-2200 Total Protein Estimated Needs: 100-110 grams Total Fluid  Estimated Needs: >2L  Current Nutrition:  TPN Advanced to soft diet 10/12 - tolerating, 720 documented - Ensure Enlive po TID, each supplement provides 350 kcal and 20 grams of protein - Magic cup TID with meals, each supplement provides 290 kcal and 9 grams of protein - PROSource PLUS PO 87mls TID, each supplement provides 100 kcals and 15 grams of protein  10/18: 48 hour Calorie count: Pt continues to have an inadequate poor po intake. Pt with good supplement consumption per RN. Continue current nutrition plan of care at this time.  Total intake (10/17 through 10/18): 1660 Kcal po route  Plan:  Spoke with surgery Richard Miu). Patient stated his appetite/intake has been poor today. Currently doing a calorie count today. Will revisit the possibility of further rate reduction and increasing oral nutritional supplementation tomorrow.  - Full rate TPN provides 101 g AA, 305 g CHO (GIR 2.5-5)  and 62 g ILE (30%) for total of 2065 kCal, meeting 100% of patient's estimated needs - Supplements providing 2220kcal and 142g protein alone when all consumed. Discussed with RD. Meeting >60% needs with supplements. TPN cut in half 10/18 (to provide 50.76g protein and 993 kcal) Con't Concentrated TPN at 12-hr cycle but now Cycle 782 ml (1/2 amount)  over 12 hours - 36 ml/hr x 1 hr; then 71 ml/hr x 10 hrs; then 36 ml/hr x 1 hr  Electrolytes in TPN: Na to 150 mEq/L (minimized free water), K to 40 mEq/L, Ca 3 mEq/L, Mg 18 mEq/L, Phos 10 mmol/L; Cl:Ac 1:1   Continue sensitive SSI TID with meals + 25 units regular insulin in TPN Standard TPN labs Mon and Thurs and  prn--labs 10/20. Change thiamine, Multivitamins, and FA to po  Waleska Buttery BS, PharmD, BCPS Clinical Staff Pharmacist [email or web address] 04/06/2021 7:47 AM

## 2021-04-06 NOTE — Progress Notes (Signed)
Inpatient Rehabilitation Admissions Coordinator   CIR bed I available to admit patient to today. I met at bedside with patient and his wife. They are in agreement. I have alerted acute team and TOC. I will make the arrangements to admit today.  Danne Baxter, RN, MSN Rehab Admissions Coordinator [phone] 04/06/2021 10:52 AM

## 2021-04-06 NOTE — H&P (Signed)
Physical Medicine and Rehabilitation Admission H&P  CC: Debility  HPI: Travis Conrad. Travis Conrad is a 70 year old right-handed male with history of PAF status post ablation 10/03/2019 followed by Dr. [name] maintained on Eliquis, hyperlipidemia, hypertension, tobacco/alcohol use, benign essential tremors, prior diverticulitis.  Per chart review patient lives with spouse.  Independent prior to admission.  1 level home 3 steps to entry.  Presented 02/20/2021 with increasing abdominal pain nausea and vomiting times several days as well as decrease in appetite and decreased urination.  On presentation to the ED he was mildly hypotensive with systolic blood pressure in the 70s.  Noted WBC of 21,000 as well as AKI with creatinine 3.2, lipase 22, lactic acid 1.3.  CT of abdomen pelvis showed inflammation of the sigmoid colon and a small amount of extraluminal gas, consistent with perforated diverticulitis and abscess.  He was started on Zosyn in the ED as well as IV fluids and low-dose Levophed.  A nasogastric tube for gastric decompression.  Close monitoring of blood pressure as well as history of PAF requiring intravenous amiodarone with echocardiogram completed 02/23/2021 showing ejection fraction of 60 to 65%.  Patient was seen in consultation by interventional radiology 02/24/2021 for aspiration and drain placement of intra-abdominal fluid collection per Dr.Jon Mugweru and later underwent exploratory laparotomy, sigmoid colectomy with end colostomy creation drainage of intra-abdominal abscess with wound VAC placement 02/26/2021 per surgery Dr.  Kieth Brightly.  Hospital course complicated by bouts of delirium developed postoperative anemia required 1 unit packed red blood cells with latest hemoglobin of 9.  Patient was cleared to resume Eliquis as prior to admission for PAF.  Urology consulted 03/02/2021 due to right scrotal pain with scrotal ultrasound demonstrating findings consistent with right epididymitis and a reactive  complex hydrocele.  Developed VRE UTI placed on Zyvox through 04/04/2021 and maintained on contact precautions.   Developed left knee pain findings of positive effusion underwent left knee aspiration  injection with good toleration, fluid showed no crystals, gram stain with no organisms.  In regards to patient's AKI initially maintained on TPN renal function much improved latest creatinine 0.8 to monitoring of sodium 128.  Patient is currently tolerating a mechanical soft diet.  Therapy evaluations completed due to patient decreased functional mobility was admitted for a comprehensive rehab program.  Review of Systems  Constitutional:  Positive for malaise/fatigue.  HENT:  Negative for hearing loss.   Eyes:  Negative for blurred vision and double vision.  Respiratory:  Negative for cough and shortness of breath.   Cardiovascular:  Positive for palpitations and leg swelling. Negative for chest pain.  Gastrointestinal:  Positive for abdominal pain, nausea and vomiting.  Genitourinary:  Negative for dysuria, flank pain and hematuria.       Decreased urinary output  Musculoskeletal:  Positive for joint pain and myalgias.  Skin:  Negative for rash.  Neurological:  Positive for tremors.  All other systems reviewed and are negative. Past Medical History:  Diagnosis Date   Atrial fibrillation ([place])    Dysrhythmia    a-fib   High cholesterol    "RX made groin hurt" (08/14/2016)   Hypertension    Past Surgical History:  Procedure Laterality Date   ANKLE FRACTURE SURGERY Left ~ 2012   "put rod and pins in"   ATRIAL FIBRILLATION ABLATION N/A 10/03/2019   Procedure: ATRIAL FIBRILLATION ABLATION;  Surgeon: Constance Haw, MD;  Location: [facility];  Service: Cardiovascular;  Laterality: N/A;   COLECTOMY N/A 02/26/2021  Procedure: PARTIAL COLECTOMY AND COLOSTOMY;  Surgeon: Kinsinger, Arta Bruce, MD;  Location: [facility];  Service: General;  Laterality: N/A;   COLONOSCOPY W/ BIOPSIES AND  POLYPECTOMY  ~ 2000   FRACTURE SURGERY     LAPAROTOMY  02/26/2021   Procedure: EXPLORATORY LAPAROTOMY;  Surgeon: Kieth Brightly Arta Bruce, MD;  Location: [facility];  Service: General;;   ORIF FIBULA FRACTURE  06/24/2012   Procedure: OPEN REDUCTION INTERNAL FIXATION (ORIF) FIBULA FRACTURE;  Surgeon: [name], MD;  Location: [facility];  Service: Orthopedics;  Laterality: Left;  ORIF LEFT FIBULA,  POSSIBLE REPAIR OF SYNDESMOSIS    TEE WITHOUT CARDIOVERSION N/A 10/03/2019   Procedure: TRANSESOPHAGEAL ECHOCARDIOGRAM (TEE);  Surgeon: Constance Haw, MD;  Location: [facility];  Service: Cardiovascular;  Laterality: N/A;   Family History  Problem Relation Age of Onset   Other Brother    Social History:  reports that he has been smoking cigarettes. He has a 50.00 pack-year smoking history. He has never used smokeless tobacco. He reports that he does not currently use alcohol. He reports current drug use. Drugs: Cocaine and Marijuana. Allergies:  Allergies  Allergen Reactions   Atorvastatin Other (See Comments)    myalgias   Medications Prior to Admission  Medication Sig Dispense Refill   acetaminophen (TYLENOL) 325 MG tablet Take 2 tablets (650 mg total) by mouth every 6 (six) hours.     [START ON 04/07/2021] amiodarone (PACERONE) 200 MG tablet Take 1 tablet (200 mg total) by mouth daily.     apixaban (ELIQUIS) 5 MG TABS tablet Take 1 tablet (5 mg total) by mouth 2 (two) times daily. 60 tablet    calcium carbonate (TUMS - DOSED IN MG ELEMENTAL CALCIUM) 500 MG chewable tablet Chew 1 tablet by mouth 3 (three) times daily as needed for indigestion or heartburn.     diltiazem (CARDIZEM) 60 MG tablet Take 1 tablet (60 mg total) by mouth every 6 (six) hours.     docusate sodium (COLACE) 100 MG capsule Take 1 capsule (100 mg total) by mouth 2 (two) times daily. 10 capsule 0   dronabinol (MARINOL) 5 MG capsule Take 1 capsule (5 mg total) by mouth 2 (two) times daily before lunch and supper.      [START ON 01/75/1025] folic acid (FOLVITE) 1 MG tablet Take 1 tablet (1 mg total) by mouth daily.     [START ON 04/07/2021] furosemide (LASIX) 20 MG tablet Take 1 tablet (20 mg total) by mouth daily. 30 tablet    ipratropium-albuterol (DUONEB) 0.5-2.5 (3) MG/3ML SOLN Take 3 mLs by nebulization every 4 (four) hours as needed. 360 mL    [START ON 04/07/2021] lidocaine (LIDODERM) 5 % Place 1 patch onto the skin daily. Remove & Discard patch within 12 hours or as directed by MD 30 patch 0   methocarbamol (ROBAXIN) 500 MG tablet Take 2 tablets (1,000 mg total) by mouth 4 (four) times daily.     [START ON 04/07/2021] nicotine (NICODERM CQ - DOSED IN MG/24 HOURS) 14 mg/24hr patch Place 1 patch (14 mg total) onto the skin daily.     oxyCODONE (OXY IR/ROXICODONE) 5 MG immediate release tablet Take 1-2 tablets (5-10 mg total) by mouth every 6 (six) hours as needed for moderate pain or severe pain. 30 tablet 0   pantoprazole (PROTONIX) 40 MG tablet Take 1 tablet (40 mg total) by mouth 2 (two) times daily.     [START ON 04/07/2021] thiamine 100 MG tablet Take 1 tablet (100  mg total) by mouth daily.      Drug Regimen Review Drug regimen was reviewed and remains appropriate with no significant issues identified  Home: Home Living Family/patient expects to be discharged to:: Private residence Living Arrangements: Spouse/significant other Available Help at Discharge: Family, Available 24 hours/day Type of Home: House Home Access: Stairs to enter Technical brewer of Steps: 3 Entrance Stairs-Rails: Right Home Layout: One level Bathroom Shower/Tub: Multimedia programmer: Standard Bathroom Accessibility: Yes Home Equipment: Environmental consultant - 2 wheels, [place] - single point, Systems analyst, Shower seat - built in, Bedside commode Additional Comments: wife reports being avaible 24/7 to assist  Lives With: Spouse   Functional History: Prior Function Level of Independence: Independent   Functional  Status:  Mobility: Bed Mobility Overal bed mobility: Modified Independent Bed Mobility: Sit to Supine, Supine to Sit Rolling: Min guard Sidelying to sit: Min guard, HOB elevated Supine to sit: Min guard Sit to supine: Min guard Sit to sidelying: Mod assist General bed mobility comments: able to get to eob with verbal cues to intiate Transfers Overall transfer level: Needs assistance Equipment used: Rolling walker (2 wheeled) Transfers: Sit to/from Stand Sit to Stand: Min guard Stand pivot transfers: Min guard General transfer comment: used rw for transfers Ambulation/Gait Ambulation/Gait assistance: Min guard Gait Distance (Feet): 110 Feet Assistive device: Rolling walker (2 wheeled) Gait Pattern/deviations: Step-through pattern, Trunk flexed General Gait Details: cue for proximity to RW and encouragement for increased distance Gait velocity: Decreased Gait velocity interpretation: <1.31 ft/sec, indicative of household ambulator   ADL: ADL Overall ADL's : Needs assistance/impaired Eating/Feeding: Minimal assistance, Sitting Eating/Feeding Details (indicate cue type and reason): Pt states his tremor keeps him from feeding self and he likes wife to feed him. Pt able to drink from cup with LUE without issue. Pt should be able to feed self. Encouraged wife and pt to allow for his independence. Will provide adaptive equipment if needed but do not feel it is needed a this time. Grooming: Wash/dry face, Wash/dry hands, Brushing hair, Standing, Min guard Grooming Details (indicate cue type and reason): performed standing at sink Upper Body Bathing: Set up, Supervision/ safety, Sitting Upper Body Bathing Details (indicate cue type and reason): performed sitting on eob Upper Body Dressing : Minimal assistance, Bed level Upper Body Dressing Details (indicate cue type and reason): not assessed, however, will infer min guard due to pain limited engagement. Lower Body Dressing: Moderate  assistance, Cueing for compensatory techniques, Sit to/from stand Lower Body Dressing Details (indicate cue type and reason): Pt sat figure 4 today with only min assist to cross [name] and donned and doffed socks.  Pt stood with min assist to manage LE clothing. Toilet Transfer: Min guard, Buyer, retail Details (indicate cue type and reason): stand pivot transfer from EOB to recliner;pt with ostomy and condom catheter Toileting- Clothing Manipulation and Hygiene: Moderate assistance Toileting - Clothing Manipulation Details (indicate cue type and reason): modA to wash bottom while standing.  Pt has difficulty letting go of walker to clean self in standing. Functional mobility during ADLs: Minimal assistance, Rolling walker General ADL Comments: required seated rest break following standing at sink for groomng   Cognition: Cognition Overall Cognitive Status: Impaired/Different from baseline Orientation Level: Oriented X4 Cognition Arousal/Alertness: Awake/alert Behavior During Therapy: Flat affect Overall Cognitive Status: Impaired/Different from baseline Area of Impairment: Safety/judgement, Awareness, Problem solving Current Attention Level: Selective Memory: Decreased short-term memory Following Commands: Follows one step commands consistently Safety/Judgement: Decreased awareness of deficits, Decreased awareness  of safety Awareness: Intellectual Problem Solving: Slow processing General Comments: requires encouragement to stay out of bed  Physical Exam: Blood pressure 123/73, pulse 86, temperature 98.5 F (36.9 C), resp. rate 19, height 6' (1.829 m), weight 92.6 kg, SpO2 100 %. Gen: appears fatigued HEENT: oral mucosa pink and moist, NCAT Cardio: Reg rate Chest: normal effort, normal rate of breathing Extremities: no edema Abdominal:     Comments: Colostomy in place.  Neurological:     Comments: Patient is alert.  Mildly anxious.  Makes eye contact with examiner.   Follows commands.  Provides name and age. Left upper extremity resting tremor  Results for orders placed or performed during the hospital encounter of 04/06/21 (from the past 48 hour(s))  Glucose, capillary     Status: Abnormal   Collection Time: 04/06/21  5:01 PM  Result Value Ref Range   Glucose-Capillary 111 (H) 70 - 99 mg/dL    Comment: Glucose reference range applies only to samples taken after fasting for at least 8 hours.   No results found.     Medical Problem List and Plan: 1.  Severe sepsis/ debility secondary to perforated diverticulitis status post exploratory laparotomy sigmoid colectomy with end colostomy drainage of intra-abdominal abscess with wound VAC placement 02/26/2021  -patient may shower  -ELOS/Goals: 5-7 days S 2.  Antithrombotics: -DVT/anticoagulation:  Pharmaceutical: Other (comment) Eliquis  -antiplatelet therapy: N/A 3. Pain Management: Lidoderm patch, Robaxin 1000 mg 4 times daily, oxycodone as needed 4. Mood: Ativan 1 mg every 6 hours as needed  -antipsychotic agents: N/A 5. Neuropsych: This patient is capable of making decisions on his own behalf. 6. Skin/Wound Care: Routine skin checks/colostomy care 7. Fluids/Electrolytes/Nutrition: Routine in and outs with follow-up chemistries 8.  Postoperative anemia.   Follow-up CBC 9.  Paroxysmal atrial fibrillation.  Amiodarone 200 mg daily, Cardizem 60 mg every 6 hours.  Follow-up cardiology services 10.  VRE UTI.  Completed course of Zyvox 04/04/2021.  Contact precautions. 11.  AKI.  Resolved.  Follow-up chemistries 12.  Tremors.  Patient was on sotalol at home and currently on hold 13.  Hypertension.  Well controlled. Decrease Lasix to 10 mg daily given frequent urination.  Monitor with increased mobility. Check magnesium level tomorrow.  14.  Right epididymitis.  Resolved.  Completed course of doxycycline.  Follow-up urology services as needed 15.  Left knee arthritis/effusion.  Responded well to steroid  injection. 16.  History of alcohol tobacco abuse.  NicoDerm patch.  Provide counseling 17.  Anasarca.  Decreased nutritional storage.  Patient maintained on TPN at half rate.  Marinol added for appetite stimulant 18.  Tobacco abuse.  NicoDerm patch.  Counseling 19. Insomnia: start Rozarem HS.  20. Suboptimal magnesium levels: d/c colace and start magnesium gluconate 250mg  HS. Also receiving magnesium sulfate in TPN.   I have personally performed a face to face diagnostic evaluation, including, but not limited to relevant history and physical exam findings, of this patient and developed relevant assessment and plan.  Additionally, I have reviewed and concur with the physician assistant's documentation above.  Izora Ribas, MD 04/06/2021   Cathlyn Parsons, PA-C

## 2021-04-06 NOTE — Discharge Summary (Signed)
Physician Discharge Summary  Travis Conrad FXT:[id number] DOB: 1951/03/19 DOA: 02/19/2021  PCP: [name], MD  Admit date: 02/19/2021 Discharge date: 04/06/2021  Admitted From: home Discharge disposition: home   Recommendations for Outpatient Follow-Up:   Monitor BMP for Na May need to adjust of d/c lasix based on volume status   Discharge Diagnosis:   Principal Problem:   Diverticulitis of large intestine with perforation and abscess without bleeding Active Problems:   Diverticulitis of intestine with abscess   Atrial fibrillation, chronic ([place])   Essential hypertension   Diverticulitis   AKI (acute kidney injury) ([place])   Severe sepsis with acute organ dysfunction ([place])   Intra-abdominal abscess ([place])   Epididymitis    Discharge Condition: Improved.  Diet recommendation: soft diet- monitor calories   Code status: Full.   History of Present Illness:   70 year old male with medical history of atrial fibrillation, dyslipidemia, hypertension, diverticulosis presented with nausea, vomiting abdominal pain.  In the ED he was found to have BP of 84/65, heart rate 57, respiration 20/min, O2 sats 91%.  CT abdomen/pelvis showed acute sigmoid diverticulitis with small extraluminal gas and fluid collection in the sigmoid mesocolon, 3.5 x 1.5 x 2.7 cm consistent with perforation and abscess.  Dilated proximal and decompressed distal small bowel loops, possible ileus. 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  Developed postop ileus Urology and ID were consulted for epididymitis 9/19 developed postop anemia, required 1 unit PRBC Positive left knee pain, positive effusion, underwent left knee  aspiration injection with good toleration, fluid showed no crystals, gram stain with no organisms 9/26, NG tube was removed 9/27 patient had vomiting abdominal distention, x-ray showed recurrent ileus, NG tube was replaced and connected to intermittent suction 10/6 patient started on clear liquid diet, diet is being advanced slowly   Hospital Course by Problem:   Severe sepsis Secondary to perforated diverticulitis, s/p colectomy -Sepsis physiology has resolved -Left psoas collection, postop ileus -CT on 9/27 showed complex fluid collection along left iliopsoas, SBO with transition in mid jejunum -General surgery consulted IR for drainage of left psoas abscess, IR did not recommend aspirating at this point of time -Antibiotics managed per ID, completed Zosyn on 10/7 -CT scan abdomen on 03/22/2021 showed ileus with improved distention of small bowel, stable psoas collection -Patient with persistent/recurrent ileus, managed by general surgery -NG tube was discontinued and patient advanced to full liquid diet -He is tolerating Ensure, 3 times a day -Diet and oral intake has been improving, tolerating full liquid diet, to be advanced to soft diet -Plan to wean off TPN and start p.o. diet as per surgery -Started on Marinol for appetite stimulation   Postop anemia -S/p 2 units PRBC   Paroxysmal atrial fibrillation/hypertension -Patient was on amiodarone and metoprolol with good rate control -Continue apixaban for anticoagulation -p.o. Cardizem, amiodarone   VRE UTI -Urine culture growing vancomycin-resistant Enterococcus -Discussed with Dr. West Bali, she recommends starting Zyvox -Patient was treated with Zyvox, stopped on 04/04/2021    Right epididymitis -Resolved -Completed therapy with doxycycline   Acute kidney injury/hypokalemia/hyponatremia/hypophosphatemia -Continue TPN per protocol- wean to off as appetite improves   Alcohol withdrawal -Resolved   Left knee  arthritis -Clinically improved after steroid injection   Hypertension -Continue Cardizem   Acute  hypoxemic respiratory failure -Significantly improved -off O2   Anasarca -Volume status improving with TPN IV Lasix -IV Lasix discontinued and started on p.o. Lasix 20 mg daily -Net -34 L   Hyponatremia -Serum sodium is 128 -labs PRN    Medical Consultants:   Urology Ortho ID GS Cards    Discharge Exam:   Vitals:   04/05/21 2324 04/06/21 0449  BP: (!) 126/54 125/65  Pulse: 89 91  Resp: 19 17  Temp: 98.3 F (36.8 C) 98.3 F (36.8 C)  SpO2: 97% 97%   Vitals:   04/05/21 1737 04/05/21 1900 04/05/21 2324 04/06/21 0449  BP: (!) 117/56 (!) 125/55 (!) 126/54 125/65  Pulse:  96 89 91  Resp:  19 19 17   Temp:  98.1 F (36.7 C) 98.3 F (36.8 C) 98.3 F (36.8 C)  TempSrc:  Oral Oral Oral  SpO2:  95% 97% 97%  Weight:      Height:        General exam: Appears calm and comfortable.   The results of significant diagnostics from this hospitalization (including imaging, microbiology, ancillary and laboratory) are listed below for reference.     Procedures and Diagnostic Studies:   CT Abdomen Pelvis Wo Contrast  Result Date: 02/19/2021 CLINICAL DATA:  Constipation, abdominal pain, not urinating much, decreased renal function on labs today, suspected intra-abdominal infection/abscess, history atrial fibrillation, hypertension, smoker EXAM: CT ABDOMEN AND PELVIS WITHOUT CONTRAST TECHNIQUE: Multidetector CT imaging of the abdomen and pelvis was performed following the standard protocol without IV contrast. Sagittal and coronal MPR images reconstructed from axial data set. No oral contrast administered. 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 Adrenal glands, kidneys, and ureters  otherwise normal appearance. Bladder decompressed. Stomach/Bowel: Normal appendix. Scattered colonic diverticulosis. Wall thickening at sigmoid colon with infiltration of pericolic fat consistent with acute diverticulitis. Small extraluminal gas and fluid collection identified consistent with perforation and abscess, collection measuring 3.5 x 1.5 x 2.7 cm. Small duodenal diverticulum. Dilated proximal and decompressed distal small bowel loops, question ileus, no discrete transition zone identified. Stomach unremarkable. Vascular/Lymphatic: Atherosclerotic calcifications aorta and iliac arteries without aneurysm. Scattered pelvic phleboliths. Reproductive: Mild prostatic enlargement. Seminal vesicles unremarkable. Other: No free air or free fluid.  No hernia. Musculoskeletal: Bones demineralized. IMPRESSION: Acute sigmoid diverticulitis with a small extraluminal gas and fluid collection in the sigmoid mesocolon 3.5 x 1.5 x 2.7 cm consistent with perforation and abscess. Dilated proximal and decompressed distal small bowel loops, question ileus. Mild prostatic enlargement. 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 Aortic Atherosclerosis (ICD10-I70.0). Findings called to Dr.  Johnney Killian on 02/19/2021 at 1847 hours. Electronically Signed   By: Lavonia Dana M.D.   On: 02/19/2021 18:48   DG Chest Port 1 View  Result Date: 02/19/2021 CLINICAL DATA:  Abdominal pain and wheezing EXAM: PORTABLE CHEST 1 VIEW COMPARISON:  Portable exam 1824 hours compared to 06/28/2020 FINDINGS: Normal heart size, mediastinal contours, and pulmonary vascularity. Atherosclerotic calcification aorta. Lungs clear. No pulmonary infiltrate, pleural effusion, or pneumothorax. Osseous structures unremarkable. IMPRESSION: No acute abnormalities. Aortic Atherosclerosis (ICD10-I70.0). Electronically Signed   By: Lavonia Dana M.D.   On: 02/19/2021 18:49   DG Abd Portable 1V  Result Date: 02/20/2021 CLINICAL DATA:  Nasogastric tube placement EXAM:  PORTABLE ABDOMEN - 1 VIEW COMPARISON:  Portable exam 2000 hours compared to 1537 hours FINDINGS: Tip of nasogastric tube  projects over stomach, though the proximal side-port likely remains in the distal esophagus; recommend advancing tube 3 cm. Lung bases clear. Dilated small bowel loops in upper abdomen. IMPRESSION: Recommend advancing nasogastric tube 3 cm. Electronically Signed   By: Lavonia Dana M.D.   On: 02/20/2021 20:43   DG Abd Portable 1V  Result Date: 02/20/2021 CLINICAL DATA:  Oral gastric tube placement. EXAM: PORTABLE ABDOMEN - 1 VIEW COMPARISON:  Abdominal radiograph dated 02/20/2021. FINDINGS: An enteric tube has been retracted with the tip at the gastroesophageal junction and the side port in the mid to distal esophagus. IMPRESSION: Enteric tube with tip near the gastroesophageal junction. Electronically Signed   By: Zerita Boers M.D.   On: 02/20/2021 15:47   DG Abd Portable 1 View  Result Date: 02/20/2021 CLINICAL DATA:  Nasogastric tube placement EXAM: PORTABLE ABDOMEN - 1 VIEW COMPARISON:  Abdominal CT from yesterday FINDINGS: The enteric tube tip reaches the stomach with side port at the GE junction. Dilated small bowel in the upper abdomen which is known from prior CT. IMPRESSION: Enteric tube with tip at the stomach and side port at the GE junction. Electronically Signed   By: Monte Fantasia M.D.   On: 02/20/2021 06:28     Labs:   Basic Metabolic Panel: Recent Labs  Lab 03/31/21 0441 04/01/21 0329 04/02/21 0500 04/03/21 0500 04/04/21 0423  NA 129* 131* 136 129* 128*  K 4.0 3.9 4.3 4.5 4.2  CL 97* 98 103 98 97*  CO2 24 27 26 24 26   GLUCOSE 143* 146* 138* 100* 174*  BUN 24* 25* 27* 26* 25*  CREATININE 1.08 1.03 0.83 0.81 0.82  CALCIUM 8.0* 8.0* 7.9* 8.0* 8.0*  MG 2.1 2.1 2.2 2.1 1.8  PHOS 4.1 3.3 3.5 3.6 3.5   GFR Estimated Creatinine Clearance: 99.5 mL/min (by C-G formula based on SCr of 0.82 mg/dL). Liver Function Tests: Recent Labs  Lab 03/31/21 0441  04/03/21 0500 04/04/21 0423  AST 20 17 14*  ALT 29 22 21   ALKPHOS 75 65 65  BILITOT 0.8 0.3 0.3  PROT 5.7* 5.7* 5.4*  ALBUMIN 1.8* 1.8* 1.7*   No results for input(s): LIPASE, AMYLASE in the last 168 hours. No results for input(s): AMMONIA in the last 168 hours. Coagulation profile No results for input(s): INR, PROTIME in the last 168 hours.  CBC: Recent Labs  Lab 04/04/21 0423 04/05/21 0238  WBC 8.5 10.3  HGB 7.0* 9.0*  HCT 23.1* 28.7*  MCV 95.1 92.6  PLT 405* 369   Cardiac Enzymes: No results for input(s): CKTOTAL, CKMB, CKMBINDEX, TROPONINI in the last 168 hours. BNP: Invalid input(s): POCBNP CBG: Recent Labs  Lab 04/04/21 1628 04/05/21 0738 04/05/21 1129 04/05/21 1642 04/06/21 0746  GLUCAP 115* 121* 111* 107* 119*   D-Dimer No results for input(s): DDIMER in the last 72 hours. Hgb A1c No results for input(s): HGBA1C in the last 72 hours. Lipid Profile Recent Labs    04/04/21 0423  TRIG 159*   Thyroid function studies No results for input(s): TSH, T4TOTAL, T3FREE, THYROIDAB in the last 72 hours.  Invalid input(s): FREET3 Anemia work up No results for input(s): VITAMINB12, FOLATE, FERRITIN, TIBC, IRON, RETICCTPCT in the last 72 hours. Microbiology Recent Results (from the past 240 hour(s))  Urine Culture     Status: Abnormal   Collection Time: 03/28/21  8:11 AM   Specimen: Urine, Clean Catch  Result Value Ref Range Status   Specimen Description URINE, CLEAN CATCH  Final  Special Requests   Final    NONE Performed at [facility], [place] [address] [place], [place] [address]    Culture (A)  Final    >=100,000 COLONIES/mL VANCOMYCIN RESISTANT ENTEROCOCCUS   Report Status 03/30/2021 FINAL  Final   Organism ID, Bacteria VANCOMYCIN RESISTANT ENTEROCOCCUS (A)  Final      Susceptibility   Vancomycin resistant enterococcus - MIC*    AMPICILLIN >=32 RESISTANT Resistant     NITROFURANTOIN 32 SENSITIVE Sensitive     VANCOMYCIN >=32 RESISTANT  Resistant     LINEZOLID 2 SENSITIVE Sensitive     * >=100,000 COLONIES/mL VANCOMYCIN RESISTANT ENTEROCOCCUS     Discharge Instructions:   Discharge Instructions     Discharge instructions   Complete by: As directed    Soft diet   Discharge wound care:   Complete by: As directed    Daily: Saline wet to dry dressing to midline wound   Increase activity slowly   Complete by: As directed       Allergies as of 04/06/2021       Reactions   Atorvastatin Other (See Comments)   myalgias        Medication List     STOP taking these medications    diltiazem 240 MG 24 hr capsule Commonly known as: Dilt-XR Replaced by: diltiazem 60 MG tablet   HYDROcodone-acetaminophen 5-325 MG tablet Commonly known as: Norco   LINZESS PO   losartan-hydrochlorothiazide 100-25 MG tablet Commonly known as: HYZAAR   Pfizer-BioNTech COVID-19 Vacc 30 MCG/0.3ML injection Generic drug: COVID-19 mRNA vaccine (Pfizer)   rosuvastatin 20 MG tablet Commonly known as: CRESTOR   sotalol 120 MG tablet Commonly known as: BETAPACE   Xarelto 20 MG Tabs tablet Generic drug: rivaroxaban   ZZZQUIL PO       TAKE these medications    acetaminophen 325 MG tablet Commonly known as: TYLENOL Take 2 tablets (650 mg total) by mouth every 6 (six) hours.   amiodarone 200 MG tablet Commonly known as: PACERONE Take 1 tablet (200 mg total) by mouth daily. Start taking on: April 07, 2021   apixaban 5 MG Tabs tablet Commonly known as: ELIQUIS Take 1 tablet (5 mg total) by mouth 2 (two) times daily.   calcium carbonate 500 MG chewable tablet Commonly known as: TUMS - dosed in mg elemental calcium Chew 1 tablet by mouth 3 (three) times daily as needed for indigestion or heartburn.   diltiazem 60 MG tablet Commonly known as: CARDIZEM Take 1 tablet (60 mg total) by mouth every 6 (six) hours. Replaces: diltiazem 240 MG 24 hr capsule   docusate sodium 100 MG capsule Commonly known as: COLACE Take  1 capsule (100 mg total) by mouth 2 (two) times daily.   dronabinol 5 MG capsule Commonly known as: MARINOL Take 1 capsule (5 mg total) by mouth 2 (two) times daily before lunch and supper.   folic acid 1 MG tablet Commonly known as: FOLVITE Take 1 tablet (1 mg total) by mouth daily. Start taking on: April 07, 2021   furosemide 20 MG tablet Commonly known as: LASIX Take 1 tablet (20 mg total) by mouth daily. Start taking on: April 07, 2021   ipratropium-albuterol 0.5-2.5 (3) MG/3ML Soln Commonly known as: DUONEB Take 3 mLs by nebulization every 4 (four) hours as needed.   lidocaine 5 % Commonly known as: LIDODERM Place 1 patch onto the skin daily. Remove & Discard patch within 12 hours or as directed by MD Start  taking on: April 07, 2021   methocarbamol 500 MG tablet Commonly known as: ROBAXIN Take 2 tablets (1,000 mg total) by mouth 4 (four) times daily.   nicotine 14 mg/24hr patch Commonly known as: NICODERM CQ - dosed in mg/24 hours Place 1 patch (14 mg total) onto the skin daily. Start taking on: April 07, 2021   oxyCODONE 5 MG immediate release tablet Commonly known as: Oxy IR/ROXICODONE Take 1-2 tablets (5-10 mg total) by mouth every 6 (six) hours as needed for moderate pain or severe pain.   pantoprazole 40 MG tablet Commonly known as: PROTONIX Take 1 tablet (40 mg total) by mouth 2 (two) times daily.   thiamine 100 MG tablet Take 1 tablet (100 mg total) by mouth daily. Start taking on: April 07, 2021               Discharge Care Instructions  (From admission, onward)           Start     Ordered   04/06/21 0000  Discharge wound care:       Comments: Daily: Saline wet to dry dressing to midline wound   04/06/21 1024            Follow-up Information     [facility] Follow up.   Why: [facility] will contact you to schedule your first home visit. Contact information: P: [phone]        Kinsinger, Arta Bruce, MD. Schedule an appointment as soon as possible for a visit in 3 week(s).   Specialty: General Surgery Why: after discharge from rehab facility Contact information: [place] [address] [phone]                  Time coordinating discharge: 35 min  Signed:  Geradine Girt DO  [facility] 04/06/2021, 10:25 AM

## 2021-04-06 NOTE — Progress Notes (Signed)
Calorie Count Note  48 hour calorie count ordered.  Diet: Soft Supplements: Ensure TID, Magic Cup TID, ProSource Plus TID  Day 2 - 10/18: Lunch: 145 kcal, 5 grams protein Dinner: 33 kcal, 1 gram protein Supplements: 700 kcal, 40 grams protein (2 Ensures)  Day 3 - 10/19: Breakfast: 525 kcal, 8 grams protein Supplements: 350 kcal, 20 grams protein (1 Ensure)  Total intake: 1753 kcal (88% of minimum estimated needs)  74 grams of protein (74% of minimum estimated needs)  Nutrition Dx: Inadequate oral intake related to acute illness (diverticulitis) as evidenced by per patient/family report, NPO status. - progressing  Goal: Patient will meet greater than or equal to 90% of their needs. - progressing  Plan to discharge to rehab today.  Intervention:  Discontinue calorie count. Continue Ensure Enlive po TID, each supplement provides 350 kcal and 20 grams of protein Continue Magic cup TID with meals, each supplement provides 290 kcal and 9 grams of protein Continue PROSource PLUS PO 68mls TID, each supplement provides 100 kcals and 15 grams of protein Patient meeting most of nutrition needs PO - TPN can be weaned from a nutrition standpoint.  Derrel Nip, RD, LDN (she/her/hers) Registered Dietitian I After-Hours/Weekend Pager # in [place]

## 2021-04-06 NOTE — Consult Note (Addendum)
[facility] Nurse ostomy consult note Stoma type/location: LUQ colostomy Stomal assessment/size:  1 1/8" oval pink moist budded just above the the skin level.  Peristomal assessment: intact Treatment options for stomal/peristomal skin: Barrier ring Output: pouch was half full of soft brown stool Ostomy pouching: 1pc. Convex Education provided: Ongoing education for the spouse. She has been educated, can do the pouch change but is still nervous about it. Please allow the spouse to empty and participate in pouch changes as much as possible.  Enrolled patient in [place] program: Yes previously  WOC will not follow at this time but are available to this patient and medical team. Please re-consult if needed.  Cathlean Marseilles Tamala Julian, MSN, RN, [place], [name], [facility] Wound Treatment Associate Pager [phone]

## 2021-04-06 NOTE — TOC Benefit Eligibility Note (Signed)
Patient Teacher, English as a foreign language completed.    The patient is currently admitted and upon discharge could be taking Eliquis 5 mg.  The current 30 day co-pay is, $133.39 due to being in Coverage Gap (donut hole).   The patient is insured through [place] [employer] [place] Medicare Part D     Lyndel Safe, [place] Patient Advocate Specialist [place] Team Direct Number: [phone]  Fax: [phone]

## 2021-04-06 NOTE — Discharge Instructions (Addendum)
Information on my medicine - ELIQUIS (apixaban)  This medication education was reviewed with me or my healthcare representative as part of my discharge preparation.    Why was Eliquis prescribed for you? Eliquis was prescribed for you to reduce the risk of a blood clot forming that can cause a stroke if you have a medical condition called atrial fibrillation (a type of irregular heartbeat).  What do You need to know about Eliquis ? Take your Eliquis TWICE DAILY - one tablet in the morning and one tablet in the evening with or without food. If you have difficulty swallowing the tablet whole please discuss with your pharmacist how to take the medication safely.  Take Eliquis exactly as prescribed by your doctor and DO NOT stop taking Eliquis without talking to the doctor who prescribed the medication.  Stopping may increase your risk of developing a stroke.  Refill your prescription before you run out.  After discharge, you should have regular check-up appointments with your healthcare provider that is prescribing your Eliquis.  In the future your dose may need to be changed if your kidney function or weight changes by a significant amount or as you get older.  What do you do if you miss a dose? If you miss a dose, take it as soon as you remember on the same day and resume taking twice daily.  Do not take more than one dose of ELIQUIS at the same time to make up a missed dose.  Important Safety Information A possible side effect of Eliquis is bleeding. You should call your healthcare provider right away if you experience any of the following: Bleeding from an injury or your nose that does not stop. Unusual colored urine (red or dark brown) or unusual colored stools (red or black). Unusual bruising for unknown reasons. A serious fall or if you hit your head (even if there is no bleeding).  Some medicines may interact with Eliquis and might increase your risk of bleeding or clotting  while on Eliquis. To help avoid this, consult your healthcare provider or pharmacist prior to using any new prescription or non-prescription medications, including herbals, vitamins, non-steroidal anti-inflammatory drugs (NSAIDs) and supplements.  This website has more information on Eliquis (apixaban): http://[email or web address].com/eliquis/home Inpatient Rehab Discharge Instructions  Travis Conrad Discharge date and time: No discharge date for patient encounter.   Activities/Precautions/ Functional Status: Activity: activity as tolerated Diet: soft Wound Care: Routine colostomy care Functional status:  ___ No restrictions     ___ Walk up steps independently ___ 24/7 supervision/assistance   ___ Walk up steps with assistance ___ Intermittent supervision/assistance  ___ Bathe/dress independently ___ Walk with walker     __x_ Bathe/dress with assistance ___ Walk Independently    ___ Shower independently ___ Walk with assistance    ___ Shower with assistance ___ No alcohol     ___ Return to work/school ________  Special Instructions: No driving smoking or alcohol   Routine Colostomy care    COMMUNITY REFERRALS UPON DISCHARGE:    Home Health:   PT  OT, SP RN                Agency: [facility]  Agency/Supplier:NA   My questions have been answered and I understand these instructions. I will adhere to these goals and the provided educational materials after my discharge from the hospital.  Patient/Caregiver Signature _______________________________ Date __________  Clinician Signature _______________________________________ Date __________  Please bring this form and your medication list with you to all your follow-up doctor's appointments.

## 2021-04-07 DIAGNOSIS — R6520 Severe sepsis without septic shock: Secondary | ICD-10-CM | POA: Diagnosis not present

## 2021-04-07 DIAGNOSIS — A419 Sepsis, unspecified organism: Secondary | ICD-10-CM | POA: Diagnosis not present

## 2021-04-07 LAB — CBC WITH DIFFERENTIAL/PLATELET
Abs Immature Granulocytes: 0.07 10*3/uL (ref 0.00–0.07)
Basophils Absolute: 0 10*3/uL (ref 0.0–0.1)
Basophils Relative: 0 %
Eosinophils Absolute: 0.2 10*3/uL (ref 0.0–0.5)
Eosinophils Relative: 1 %
HCT: 29 % — ABNORMAL LOW (ref 39.0–52.0)
Hemoglobin: 9 g/dL — ABNORMAL LOW (ref 13.0–17.0)
Immature Granulocytes: 1 %
Lymphocytes Relative: 29 %
Lymphs Abs: 3.3 10*3/uL (ref 0.7–4.0)
MCH: 28.9 pg (ref 26.0–34.0)
MCHC: 31 g/dL (ref 30.0–36.0)
MCV: 93.2 fL (ref 80.0–100.0)
Monocytes Absolute: 1.1 10*3/uL — ABNORMAL HIGH (ref 0.1–1.0)
Monocytes Relative: 10 %
Neutro Abs: 6.6 10*3/uL (ref 1.7–7.7)
Neutrophils Relative %: 59 %
Platelets: 386 10*3/uL (ref 150–400)
RBC: 3.11 MIL/uL — ABNORMAL LOW (ref 4.22–5.81)
RDW: 16.3 % — ABNORMAL HIGH (ref 11.5–15.5)
WBC: 11.3 10*3/uL — ABNORMAL HIGH (ref 4.0–10.5)
nRBC: 0 % (ref 0.0–0.2)

## 2021-04-07 LAB — COMPREHENSIVE METABOLIC PANEL
ALT: 21 U/L (ref 0–44)
AST: 18 U/L (ref 15–41)
Albumin: 2 g/dL — ABNORMAL LOW (ref 3.5–5.0)
Alkaline Phosphatase: 80 U/L (ref 38–126)
Anion gap: 8 (ref 5–15)
BUN: 24 mg/dL — ABNORMAL HIGH (ref 8–23)
CO2: 28 mmol/L (ref 22–32)
Calcium: 8.6 mg/dL — ABNORMAL LOW (ref 8.9–10.3)
Chloride: 96 mmol/L — ABNORMAL LOW (ref 98–111)
Creatinine, Ser: 0.81 mg/dL (ref 0.61–1.24)
GFR, Estimated: >60 mL/min (ref >60)
Glucose, Bld: 142 mg/dL — ABNORMAL HIGH (ref 70–99)
Potassium: 4.2 mmol/L (ref 3.5–5.1)
Sodium: 132 mmol/L — ABNORMAL LOW (ref 135–145)
Total Bilirubin: 0.5 mg/dL (ref 0.3–1.2)
Total Protein: 6.1 g/dL — ABNORMAL LOW (ref 6.5–8.1)

## 2021-04-07 LAB — GLUCOSE, CAPILLARY
Glucose-Capillary: 149 mg/dL — ABNORMAL HIGH (ref 70–99)
Glucose-Capillary: 141 mg/dL — ABNORMAL HIGH (ref 70–99)
Glucose-Capillary: 123 mg/dL — ABNORMAL HIGH (ref 70–99)
Glucose-Capillary: 121 mg/dL — ABNORMAL HIGH (ref 70–99)

## 2021-04-07 LAB — MAGNESIUM: Magnesium: 2 mg/dL (ref 1.7–2.4)

## 2021-04-07 LAB — VITAMIN D 25 HYDROXY (VIT D DEFICIENCY, FRACTURES): Vit D, 25-Hydroxy: 14.65 ng/mL — ABNORMAL LOW (ref 30–100)

## 2021-04-07 MED ORDER — CHLORHEXIDINE GLUCONATE CLOTH 2 % EX PADS
6.0000 | MEDICATED_PAD | Freq: Every day | CUTANEOUS | Status: DC
  Administered 2021-04-07 – 2021-04-20 (×13): 6 via TOPICAL

## 2021-04-07 MED ORDER — SODIUM CHLORIDE 0.9% FLUSH: 10.0000 mL | Freq: Two times a day (BID) | INTRAVENOUS | Status: DC

## 2021-04-07 MED ORDER — ADULT MULTIVITAMIN W/MINERALS CH
1.0000 | ORAL_TABLET | Freq: Every day | ORAL | Status: DC
  Administered 2021-04-07 – 2021-04-21 (×15): 1 via ORAL
  Filled 2021-04-07 (×15): qty 1

## 2021-04-07 MED ORDER — CAMPHOR-MENTHOL 0.5-0.5 % EX LOTN
1.0000 "application " | TOPICAL_LOTION | CUTANEOUS | Status: DC | PRN
  Filled 2021-04-07: qty 222

## 2021-04-07 MED ORDER — VITAMIN D (ERGOCALCIFEROL) 1.25 MG (50000 UNIT) PO CAPS
50000.0000 [IU] | ORAL_CAPSULE | ORAL | Status: DC
  Administered 2021-04-07 – 2021-04-14 (×2): 50000 [IU] via ORAL
  Filled 2021-04-07 (×2): qty 1

## 2021-04-07 MED ORDER — SODIUM CHLORIDE 0.9% FLUSH: 10.0000 mL | INTRAVENOUS | Status: DC | PRN

## 2021-04-07 NOTE — Progress Notes (Signed)
[facility] Individual Statement of Services  Patient Name:  Travis Conrad  Date:  04/07/2021  Welcome to the [facility].  Our goal is to provide you with an individualized program based on your diagnosis and situation, designed to meet your specific needs.  With this comprehensive rehabilitation program, you will be expected to participate in at least 3 hours of rehabilitation therapies Monday-Friday, with modified therapy programming on the weekends.  Your rehabilitation program will include the following services:  Physical Therapy (PT), Occupational Therapy (OT), Speech Therapy (ST), 24 hour per day rehabilitation nursing, Neuropsychology, Care Coordinator, Rehabilitation Medicine, Nutrition Services, and Pharmacy Services  Weekly team conferences will be held on Tuesday to discuss your progress.  Your Inpatient Rehabilitation Care Coordinator will talk with you frequently to get your input and to update you on team discussions.  Team conferences with you and your family in attendance may also be held.  Expected length of stay: 7-10 DAYS  Overall anticipated outcome: SUPERVISION WITH CUES  Depending on your progress and recovery, your program may change. Your Inpatient Rehabilitation Care Coordinator will coordinate services and will keep you informed of any changes. Your Inpatient Rehabilitation Care Coordinator's name and contact numbers are listed  below.  The following services may also be recommended but are not provided by the [facility] [place] [facility] will be made to provide these services after discharge if needed.  Arrangements include referral to agencies that provide these services.  Your insurance has been verified to be:  [employer] primary doctor is:  [name]  Pertinent information will be shared with your  doctor and your insurance company.  Inpatient Rehabilitation Care Coordinator:  Ovidio Kin, [place] or Emilia Beck  Information discussed with and copy given to patient by: [name], 04/07/2021, 11:57 AM

## 2021-04-07 NOTE — Evaluation (Signed)
Physical Therapy Assessment and Plan  Patient Details  Name: Travis Conrad MRN: [id number] Date of Birth: 09-Mar-1951  PT Diagnosis: Abnormality of gait, Difficulty walking, Dizziness and giddiness, Edema, Impaired cognition, Muscle weakness, and Pain in joint Rehab Potential: Good ELOS: 7-10 days   Today's Date: 04/07/2021 PT Individual Time: 0800-0908 PT Individual Time Calculation (min): 68 min    Hospital Problem: Principal Problem:   Sepsis ([place])   Past Medical History:  Past Medical History:  Diagnosis Date   Atrial fibrillation ([name])    Dysrhythmia    a-fib   High cholesterol    "RX made groin hurt" (08/14/2016)   Hypertension    Past Surgical History:  Past Surgical History:  Procedure Laterality Date   ANKLE FRACTURE SURGERY Left ~ 2012   "put rod and pins in"   ATRIAL FIBRILLATION ABLATION N/A 10/03/2019   Procedure: ATRIAL FIBRILLATION ABLATION;  Surgeon: Constance Haw, MD;  Location: [place] [facility];  Service: Cardiovascular;  Laterality: N/A;   COLECTOMY N/A 02/26/2021   Procedure: PARTIAL COLECTOMY AND COLOSTOMY;  Surgeon: Kieth Brightly, Arta Bruce, MD;  Location: [place];  Service: General;  Laterality: N/A;   COLONOSCOPY W/ BIOPSIES AND POLYPECTOMY  ~ 2000   FRACTURE SURGERY     LAPAROTOMY  02/26/2021   Procedure: EXPLORATORY LAPAROTOMY;  Surgeon: Mickeal Skinner, MD;  Location: [place];  Service: General;;   ORIF FIBULA FRACTURE  06/24/2012   Procedure: OPEN REDUCTION INTERNAL FIXATION (ORIF) FIBULA FRACTURE;  Surgeon: [name], MD;  Location: [place];  Service: Orthopedics;  Laterality: Left;  ORIF LEFT FIBULA,  POSSIBLE REPAIR OF SYNDESMOSIS    TEE WITHOUT CARDIOVERSION N/A 10/03/2019   Procedure: TRANSESOPHAGEAL ECHOCARDIOGRAM (TEE);  Surgeon: Constance Haw, MD;  Location: [place] [facility];  Service: Cardiovascular;  Laterality: N/A;    Assessment & Plan Clinical Impression: Patient is a 70 y.o. year old male with history of PAF  status post ablation 10/03/2019 followed by Dr. [name] maintained on Eliquis, hyperlipidemia, hypertension, tobacco/alcohol use, benign essential tremors, prior diverticulitis.  Per chart review patient lives with spouse.  Independent prior to admission.  1 level home 3 steps to entry.  Presented 02/20/2021 with increasing abdominal pain nausea and vomiting times several days as well as decrease in appetite and decreased urination.  On presentation to the ED he was mildly hypotensive with systolic blood pressure in the 70s.  Noted WBC of 21,000 as well as AKI with creatinine 3.2, lipase 22, lactic acid 1.3.  CT of abdomen pelvis showed inflammation of the sigmoid colon and a small amount of extraluminal gas, consistent with perforated diverticulitis and abscess.  He was started on Zosyn in the ED as well as IV fluids and low-dose Levophed.  A nasogastric tube for gastric decompression.  Close monitoring of blood pressure as well as history of PAF requiring intravenous amiodarone with echocardiogram completed 02/23/2021 showing ejection fraction of 60 to 65%.  Patient was seen in consultation by interventional radiology 02/24/2021 for aspiration and drain placement of intra-abdominal fluid collection per Dr.Jon Mugweru and later underwent exploratory laparotomy, sigmoid colectomy with end colostomy creation drainage of intra-abdominal abscess with wound VAC placement 02/26/2021 per surgery Dr.  Kieth Brightly.  Hospital course complicated by bouts of delirium developed postoperative anemia required 1 unit packed red blood cells with latest hemoglobin of 9.  Patient was cleared to resume Eliquis as prior to admission for PAF.  Urology consulted 03/02/2021 due to right scrotal pain with scrotal ultrasound  demonstrating findings consistent with right epididymitis and a reactive complex hydrocele.  Developed VRE UTI placed on Zyvox through 04/04/2021 and maintained on contact precautions.   Developed left knee pain findings of  positive effusion underwent left knee aspiration  injection with good toleration, fluid showed no crystals, gram stain with no organisms.  In regards to patient's AKI initially maintained on TPN renal function much improved latest creatinine 0.8 to monitoring of sodium 128.  Patient is currently tolerating a mechanical soft diet.  Therapy evaluations completed due to patient decreased functional mobility was admitted for a comprehensive rehab program.  Patient currently requires min with mobility secondary to muscle weakness, decreased coordination and decreased motor planning, decreased visual acuity and decreased visual perceptual skills, decreased awareness, decreased problem solving, decreased safety awareness, decreased memory, and delayed processing, and decreased standing balance.  Prior to hospitalization, patient was independent  with mobility and lived with Spouse in a House home.  Home access is 3Stairs to enter.  Patient will benefit from skilled PT intervention to maximize safe functional mobility, minimize fall risk, and decrease caregiver burden for planned discharge home with intermittent assist.  Anticipate patient will benefit from follow up [facility] at discharge.  PT - End of Session Activity Tolerance: Tolerates 10 - 20 min activity with multiple rests Endurance Deficit: Yes Endurance Deficit Description: Requires frequent rest breaks due to deconditioning PT Assessment Rehab Potential (ACUTE/IP ONLY): Good PT Barriers to Discharge: Decreased caregiver support;Home environment access/layout;Wound Care;Behavior PT Patient demonstrates impairments in the following area(s): Balance;Behavior;Endurance;Motor;Pain;Safety;Skin Integrity PT Transfers Functional Problem(s): Bed Mobility;Bed to Chair;Car;Furniture PT Locomotion Functional Problem(s): Ambulation;Wheelchair Mobility;Stairs PT Plan PT Intensity: Minimum of 1-2 x/day ,45 to 90 minutes PT Frequency: 5 out of 7 days PT Duration  Estimated Length of Stay: 7-10 days PT Treatment/Interventions: Ambulation/gait training;Community reintegration;DME/adaptive equipment instruction;Neuromuscular re-education;Psychosocial support;UE/LE Strength taining/ROM;Stair training;Wheelchair propulsion/positioning;UE/LE Coordination activities;Therapeutic Activities;Skin care/wound management;Pain management;Discharge planning;Functional electrical stimulation;Balance/vestibular training;Cognitive remediation/compensation;Disease management/prevention;Functional mobility training;Patient/family education;Splinting/orthotics;Therapeutic Exercise;Visual/perceptual remediation/compensation PT Transfers Anticipated Outcome(s): S* PT Locomotion Anticipated Outcome(s): S* PT Recommendation Recommendations for Other Services: Neuropsych consult Follow Up Recommendations: Home health PT Patient destination: Home Equipment Recommended: To be determined   PT Evaluation Precautions/Restrictions Precautions Precautions: Fall Restrictions Weight Bearing Restrictions: No Pain Interference Pain Interference Pain Effect on Sleep: 1. Rarely or not at all Pain Interference with Therapy Activities: 3. Frequently Pain Interference with Day-to-Day Activities: 1. Rarely or not at all Home Living/Prior [place] Available Help at Discharge: Family;Available PRN/intermittently Type of Home: House Home Access: Stairs to enter [employer] of Steps: 3 Entrance Stairs-Rails: Right Home Layout: One level Bathroom Shower/Tub: Multimedia programmer: Standard Bathroom Accessibility: Yes Additional Comments: Pt reports wife not being able to assist 24/7  Lives With: Spouse Prior Function Level of Independence: Independent with basic ADLs;Independent with transfers;Independent with homemaking with ambulation  Able to Take Stairs?: Yes Driving: No Comments: Pt unable to recall majority of subjective information, wife not  present Vision/Perception  Vision - History Ability to See in Adequate Light: 2 Moderately impaired Vision - Assessment Additional Comments: 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, brown spots in R eye Perception Perception: Within Functional Limits Praxis Praxis: Intact  Cognition Overall Cognitive Status: Impaired/Different from baseline Arousal/Alertness: Awake/alert Orientation Level: Disoriented to time Safety/Judgment: Impaired Sensation Sensation Light Touch: Not tested Additional Comments: Pt very agitated and confused - unable to understand sensation questions and refused Coordination Gross Motor Movements are Fluid and Coordinated: No Coordination and Movement Description: Impacted by pain,  fatigue, and impaired vision Finger Nose Finger Test: Significant dymetria bilaterally, impaired vision L > R eye Heel Shin Test: NT Motor  Motor Motor: Within Functional Limits   Trunk/Postural Assessment  Cervical Assessment Cervical Assessment: Exceptions to [facility] (Forward head) Thoracic Assessment Thoracic Assessment: Exceptions to [facility] (kyphotic) Lumbar Assessment Lumbar Assessment: Exceptions to [facility] (Posterior pelvic tilt) Postural Control Postural Control: Within Functional Limits  Balance Balance Balance Assessed: Yes Static Sitting Balance Static Sitting - Balance Support: Feet supported;Bilateral upper extremity supported Static Sitting - Level of Assistance: 5: Stand by assistance Dynamic Sitting Balance Dynamic Sitting - Balance Support: Feet supported Dynamic Sitting - Level of Assistance: 5: Stand by assistance Dynamic Sitting - Balance Activities: Lateral lean/weight shifting;Forward lean/weight shifting Static Standing Balance Static Standing - Balance Support: During functional activity;Bilateral upper extremity supported Static Standing - Level of Assistance: 5: Stand by assistance Dynamic Standing Balance Dynamic Standing - Balance Support: During  functional activity;Bilateral upper extremity supported Dynamic Standing - Level of Assistance: 5: Stand by assistance Extremity Assessment  [name] Assessment [name] Assessment: Exceptions to [facility] [name] Strength [name] Overall Strength: Due to pain;Deficits Right Hip Flexion: 4/5 Right Hip Extension: 4/5 Right Hip ABduction: 4/5 Right Hip ADduction: 4/5 Right Knee Flexion: 4/5 Right Knee Extension: 4/5 Right Ankle Dorsiflexion: 4/5 Right Ankle Plantar Flexion: 4/5 [name] Assessment [name] Assessment: Exceptions to WFL [name] Strength [name] Overall Strength: Within Functional Limits for tasks assessed Left Hip Flexion: 4/5 Left Hip Extension: 4/5 Left Hip ABduction: 4/5 Left Hip ADduction: 4/5 Left Knee Flexion: 4/5 Left Knee Extension: 4/5 Left Ankle Dorsiflexion: 4/5 Left Ankle Plantar Flexion: 4/5  Care Tool Care Tool Bed Mobility Roll left and right activity   Roll left and right assist level: Contact Guard/Touching assist    Sit to lying activity   Sit to lying assist level: Contact Guard/Touching assist    Lying to sitting on side of bed activity   Lying to sitting on side of bed assist level: the ability to move from lying on the back to sitting on the side of the bed with no back support.: Contact Guard/Touching assist     Care Tool Transfers Sit to stand transfer   Sit to stand assist level: Contact Guard/Touching assist    Chair/bed transfer Chair/bed transfer activity did not occur: Safety/medical concerns (Fatigue, agitation, pain)       Psychologist, counselling transfer activity did not occur: Safety/medical concerns (Fatigue, agitation, pain)        Care Tool Locomotion Ambulation   Assist level: Contact Guard/Touching assist Assistive device: Walker-rolling Max distance: 10'  Walk 10 feet activity   Assist level: Contact Guard/Touching assist Assistive device: Walker-rolling   Walk 50 feet with 2 turns activity Walk 50 feet with 2 turns activity  did not occur: Safety/medical concerns (Fatigue, agitation, pain)      Walk 150 feet activity Walk 150 feet activity did not occur: Safety/medical concerns (Fatigue, agitation, pain)      Walk 10 feet on uneven surfaces activity Walk 10 feet on uneven surfaces activity did not occur: Safety/medical concerns (Fatigue, agitation, pain)      Stairs Stair activity did not occur: Safety/medical concerns (Fatigue, agitation, pain)        Walk up/down 1 step activity Walk up/down 1 step or curb (drop down) activity did not occur: Safety/medical concerns (Fatigue, agitation, pain)     Walk up/down 4 steps activity did not occuR: Safety/medical concerns (Fatigue,  agitation, pain)  Walk up/down 4 steps activity      Walk up/down 12 steps activity Walk up/down 12 steps activity did not occur: Safety/medical concerns (Fatigue, agitation, pain)      Pick up small objects from floor Pick up small object from the floor (from standing position) activity did not occur: Safety/medical concerns (Fatigue, agitation, pain)      Wheelchair Is the patient using a wheelchair?: Yes Type of Wheelchair: Manual   Wheelchair assist level: Dependent - Patient 0% Max wheelchair distance: 0  Wheel 50 feet with 2 turns activity   Assist Level: Dependent - Patient 0%  Wheel 150 feet activity   Assist Level: Dependent - Patient 0%    Refer to Care Plan for Long Term Goals  SHORT TERM GOAL WEEK 1 PT Short Term Goal 1 (Week 1): STG = LTG due to ELOS  Recommendations for other services: Neuropsych  Skilled Therapeutic Intervention Evaluation completed (see details above and below) with education on PT POC, CIR safety policies, 3 hour therapy requirement and goals. Pt received supine in bed asleep, difficult to arouse and disoriented on time of day. Upon telling pt it was 8am, pt disagreed and stated it was 9pm and he was getting ready for bed. Pt denied pain and was agreeable to PT once he was shown his  schedule and room blinds were raised to allow sunlight into room to reorient to time of day. Pt unable to provide accurate subjective exam due to poor short term memory (baseline) and decline in working memory. Pt's wife not present for subjective portion of eval so majority of info taken from acute eval when wife was present. Pt performed bed mobility w/CGA and max verbal cues for motor planning. Pt easily agitated and refused to allow therapist to perform sensory testing due to not understanding the task, despite repeated multimodal cues. Pt performed sit <> stand from bed and ambulated 10' w/RW and CGA. Noted kyphotic posture, shuffling gait and narrow BOS. Pt declined walking further due to "feeling buzzed like I have smoked marijuana", fatigue and blurry vision (baseline). Pt reported he is legally blind in L eye and has "brown spots" in R eye. Pt performed sit <> supine w/CGA but required 2 helpers to scoot to [facility] due to R knee effusion, pain and decreased B shoulder ROM. Pt's wife present for end of eval and observed significant tension between her and pt. Pt was left supine in bed, wife and Dr. Ranell Patrick present, all needs in reach. Safety plan updated.   Mobility Bed Mobility Bed Mobility: Rolling Right;Rolling Left;Supine to Sit;Sitting - Scoot to [employer] of Bed;Sit to Supine;Scooting to [facility] Rolling Right: Contact Guard/Touching assist Rolling Left: Contact Guard/Touching assist Supine to Sit: Contact Guard/Touching assist Sitting - Scoot to Edge of Bed: Contact Guard/Touching assist Sit to Supine: Contact Guard/Touching assist Scooting to HOB: 2 Helpers Transfers Transfers: Sit to Stand;Stand to [employer] Transfers Sit to Stand: Contact Guard/Touching assist Stand to Sit: Contact Guard/Touching assist Stand Pivot Transfers: Contact Guard/Touching assist Transfer (Assistive device): Rolling walker Locomotion  Gait Ambulation: Yes Gait Assistance: Contact Guard/Touching assist Gait  Distance (Feet): 10 Feet Assistive device: Rolling walker Gait Gait: Yes Gait Pattern: Impaired Gait Pattern: Decreased step length - right;Decreased step length - left;Decreased stride length;Antalgic;Trunk flexed;Poor foot clearance - right;Poor foot clearance - left Gait velocity: Decreased Stairs / Additional Locomotion Stairs: No Wheelchair Mobility Wheelchair Mobility: No   Discharge Criteria: Patient will be discharged from PT if patient refuses  treatment 3 consecutive times without medical reason, if treatment goals not met, if there is a change in medical status, if patient makes no progress towards goals or if patient is discharged from hospital.  The above assessment, treatment plan, treatment alternatives and goals were discussed and mutually agreed upon: by patient and by family  Cruzita Lederer Cori Henningsen, PT, DPT 04/07/2021, 9:45 AM

## 2021-04-07 NOTE — Progress Notes (Signed)
PHARMACY - TOTAL PARENTERAL NUTRITION CONSULT NOTE   Indication: Prolonged ileus  Patient Measurements: Height: 6' (182.9 cm) Weight: 90.3 kg (199 lb) IBW/kg (Calculated) : 77.6 TPN AdjBW (KG): 92.6 Body mass index is 26.99 kg/m.   RD Assessment:  Intervention:  Discontinue calorie count. Continue Ensure Enlive po TID, each supplement provides 350 kcal and 20 grams of protein Continue Magic cup TID with meals, each supplement provides 290 kcal and 9 grams of protein Continue PROSource PLUS PO 25mls TID, each supplement provides 100 kcals and 15 grams of protein Patient meeting most of nutrition needs PO - TPN can be weaned from a nutrition standpoint.  Current Nutrition:  Soft diet  Plan:  Spoke with Dietary and Physician regarding need to continue TPN. At this time, we will discontinue TPN along with labs. Please re-consult Pharmacy if TPN therapy warranted.   Thank you  Travis Conrad J Travis Conrad BS, PharmD, BCPS 04/07/2021,9:39 AM

## 2021-04-07 NOTE — Progress Notes (Signed)
Inpatient Rehabilitation  Patient information reviewed and entered into eRehab system by Jeree Delcid Shaquel Josephson, OTR/L.   Information including medical coding, functional ability and quality indicators will be reviewed and updated through discharge.    

## 2021-04-07 NOTE — Plan of Care (Signed)
  Problem: RH Balance Goal: LTG Patient will maintain dynamic standing balance (PT) Description: LTG:  Patient will maintain dynamic standing balance with assistance during mobility activities (PT) Flowsheets (Taken 04/07/2021 1603) LTG: Pt will maintain dynamic standing balance during mobility activities with:: (LRAD) Supervision/Verbal cueing   Problem: Sit to Stand Goal: LTG:  Patient will perform sit to stand with assistance level (PT) Description: LTG:  Patient will perform sit to stand with assistance level (PT) Flowsheets (Taken 04/07/2021 1603) LTG: PT will perform sit to stand in preparation for functional mobility with assistance level: (LRAD) Supervision/Verbal cueing   Problem: RH Bed Mobility Goal: LTG Patient will perform bed mobility with assist (PT) Description: LTG: Patient will perform bed mobility with assistance, with/without cues (PT). Flowsheets (Taken 04/07/2021 1603) LTG: Pt will perform bed mobility with assistance level of: Independent with assistive device    Problem: RH Bed to Chair Transfers Goal: LTG Patient will perform bed/chair transfers w/assist (PT) Description: LTG: Patient will perform bed to chair transfers with assistance (PT). Flowsheets (Taken 04/07/2021 1603) LTG: Pt will perform Bed to Chair Transfers with assistance level: (LRAD) Supervision/Verbal cueing   Problem: RH Car Transfers Goal: LTG Patient will perform car transfers with assist (PT) Description: LTG: Patient will perform car transfers with assistance (PT). Flowsheets (Taken 04/07/2021 1603) LTG: Pt will perform car transfers with assist:: (LRAD) Supervision/Verbal cueing   Problem: RH Furniture Transfers Goal: LTG Patient will perform furniture transfers w/assist (OT/PT) Description: LTG: Patient will perform furniture transfers  with assistance (OT/PT). Flowsheets (Taken 04/07/2021 1603) LTG: Pt will perform furniture transfers with assist:: (LRAD) Supervision/Verbal cueing    Problem: RH Ambulation Goal: LTG Patient will ambulate in controlled environment (PT) Description: LTG: Patient will ambulate in a controlled environment, # of feet with assistance (PT). Flowsheets (Taken 04/07/2021 1603) LTG: Pt will ambulate in controlled environ  assist needed:: (LRAD) Supervision/Verbal cueing LTG: Ambulation distance in controlled environment: 150' Goal: LTG Patient will ambulate in home environment (PT) Description: LTG: Patient will ambulate in home environment, # of feet with assistance (PT). Flowsheets (Taken 04/07/2021 1603) LTG: Pt will ambulate in home environ  assist needed:: (LRAD) Supervision/Verbal cueing LTG: Ambulation distance in home environment: 50'   Problem: RH Wheelchair Mobility Goal: LTG Patient will propel w/c in controlled environment (PT) Description: LTG: Patient will propel wheelchair in controlled environment, # of feet with assist (PT) Flowsheets (Taken 04/07/2021 1603) LTG: Pt will propel w/c in controlled environ  assist needed:: Supervision/Verbal cueing LTG: Propel w/c distance in controlled environment: 150'   Problem: RH Stairs Goal: LTG Patient will ambulate up and down stairs w/assist (PT) Description: LTG: Patient will ambulate up and down # of stairs with assistance (PT) Flowsheets (Taken 04/07/2021 1603) LTG: Pt will ambulate up/down stairs assist needed:: Supervision/Verbal cueing LTG: Pt will  ambulate up and down number of stairs: 4   Problem: RH Memory Goal: LTG Patient will demonstrate ability for day to day recall/carry over during activities of daily living with assistance level (PT) Description: LTG:  Patient will demonstrate ability for day to day recall/carry over during activities of daily living with assistance level (PT). Flowsheets (Taken 04/07/2021 1603) LTG:  Patient will demonstrate ability for day to day recall/carry over during activities of daily living with assistance level (PT): Minimal Assistance -  Patient > 75%

## 2021-04-07 NOTE — Progress Notes (Signed)
Inpatient Rehabilitation Care Coordinator Assessment and Plan Patient Details  Name: Travis Conrad MRN: [id number] Date of Birth: 04-24-1951  Today's Date: 04/07/2021  Hospital Problems: Principal Problem:   Sepsis [facility])  Past Medical History:  Past Medical History:  Diagnosis Date   Atrial fibrillation ([place])    Dysrhythmia    a-fib   High cholesterol    "RX made groin hurt" (08/14/2016)   Hypertension    Past Surgical History:  Past Surgical History:  Procedure Laterality Date   ANKLE FRACTURE SURGERY Left ~ 2012   "put rod and pins in"   ATRIAL FIBRILLATION ABLATION N/A 10/03/2019   Procedure: ATRIAL FIBRILLATION ABLATION;  Surgeon: Constance Haw, MD;  Location: [place] [facility];  Service: Cardiovascular;  Laterality: N/A;   COLECTOMY N/A 02/26/2021   Procedure: PARTIAL COLECTOMY AND COLOSTOMY;  Surgeon: Kieth Brightly, Arta Bruce, MD;  Location: [place];  Service: General;  Laterality: N/A;   COLONOSCOPY W/ BIOPSIES AND POLYPECTOMY  ~ 2000   FRACTURE SURGERY     LAPAROTOMY  02/26/2021   Procedure: EXPLORATORY LAPAROTOMY;  Surgeon: Mickeal Skinner, MD;  Location: [place];  Service: General;;   ORIF FIBULA FRACTURE  06/24/2012   Procedure: OPEN REDUCTION INTERNAL FIXATION (ORIF) FIBULA FRACTURE;  Surgeon: [name], MD;  Location: [place];  Service: Orthopedics;  Laterality: Left;  ORIF LEFT FIBULA,  POSSIBLE REPAIR OF SYNDESMOSIS    TEE WITHOUT CARDIOVERSION N/A 10/03/2019   Procedure: TRANSESOPHAGEAL ECHOCARDIOGRAM (TEE);  Surgeon: Constance Haw, MD;  Location: [place] [facility];  Service: Cardiovascular;  Laterality: N/A;   Social History:  reports that he has been smoking cigarettes. He has a 50.00 pack-year smoking history. He has never used smokeless tobacco. He reports that he does not currently use alcohol. He reports current drug use. Drugs: Cocaine and Marijuana.  Family / Support Systems Marital Status: Married Patient Roles: Spouse,  Parent Spouse/Significant Other: Jimmi [phone] Children: Two children one local and one in [place] Other Supports: friends Anticipated Caregiver: Wife Ability/Limitations of Caregiver: none Caregiver Availability: 24/7 Family Dynamics: Close knit family both pt and wife are grateful for their family. Wife ois here daily from 9:00-6:00 pm, discusse with her taking a break while he is here, once comfortable with staff  Social History Preferred language: English Religion: Baptist Cultural Background: No issues Education: [place] - How often do you need to have someone help you when you read instructions, pamphlets, or other written material from your doctor or pharmacy?: Never Writes: Yes Employment Status: Retired Public relations account executive Issues: No issues Guardian/Conservator: None-according to MD pt is capable of making his decisions here, wife will be here and pt wants her included if any eed to be made while here   Abuse/Neglect Abuse/Neglect Assessment Can Be Completed: Yes Physical Abuse: Denies Verbal Abuse: Denies Sexual Abuse: Denies Exploitation of patient/patient's resources: Denies Self-Neglect: Denies  Patient response to: Social Isolation - How often do you feel lonely or isolated from those around you?: Never  Emotional Status Pt's affect, behavior and adjustment status: Pt is hoping to get stronger and do well on rehab to finally be able to get home. He has been in the hospital for 7 weeks now. He is trying to stay positive and keep moving forward. wife reports it has been a long journey Recent Psychosocial Issues: other health issues but were managed before this Psychiatric History: No history pt is trying to get used to the new unit and to be  moving again. He probably would benefit from seeing neuro-psych while here since such long hospitalization. Substance Abuse History: Tobacco and soical ETOH wife reports this was blown out of proportion  when he first came in.  Patient / Family Perceptions, Expectations & Goals Pt/Family understanding of illness & functional limitations: Pt and wife can explain his health issues and set backs, both talk with the MD when rounding and feel they have a good understanding of his plan moving forward. Wife writes everything down and feels it helps her when their children ask what is going on. Premorbid pt/family roles/activities: Husband, father, retiree, friend, etc Anticipated changes in roles/activities/participation: resume Pt/family expectations/goals: Pt states: " I hope to get back to doing for myself, I know at first I will need my wife;s help, but hopefully not for long."  Wife states: " I am hopeful he will get stronger, I can;t physically lift him."  [employer]: None Premorbid Home Care/DME Agencies: Other (Comment) (has transport chair, rw, bsc, tub seat) Transportation available at discharge: wife pt did drive PTA Is the patient able to respond to transportation needs?: Yes In the past 12 months, has lack of transportation kept you from medical appointments or from getting medications?: No In the past 12 months, has lack of transportation kept you from meetings, work, or from getting things needed for daily living?: No Resource referrals recommended: Neuropsychology  Discharge Planning Living Arrangements: Spouse/significant other Support Systems: Spouse/significant other, Children, Friends/neighbors Type of Residence: Private residence Insurance Resources: Multimedia programmer (specify) Psychologist, prison and probation services) Financial Resources: [place] Referred: No Living Expenses: Own Money Management: Patient, Spouse Does the patient have any problems obtaining your medications?: No Home Management: wife Patient/Family Preliminary Plans: Return home with wife who is learning how to change his colostomy and dressings. She is here daily and willing to  participate when appropriate. Pt is reliant on her. Aware evaluations today and will work on discharge needs. Care Coordinator Barriers to Discharge: Insurance for SNF coverage Care Coordinator Anticipated Follow Up Needs: HH/OP  Clinical Impression Pleasant gentleman who has been through much with seven week long hospitalization. His wife has been there with him. Pt would benefit from seeing neuro-psych while here and will work on discharge needs.   [name] 04/07/2021, 11:53 AM

## 2021-04-07 NOTE — Evaluation (Signed)
Occupational Therapy Assessment and Plan  Patient Details  Name: Travis Conrad MRN: [id number] Date of Birth: [birth year]  OT Diagnosis: abnormal posture, altered mental status, cognitive deficits, muscle weakness (generalized), and pain in joint Rehab Potential: Rehab Potential (ACUTE ONLY): Good ELOS: 7-10 days   Today's Date: 04/07/2021 OT Individual Time: 1000-1100 OT Individual Time Calculation (min): 60 min     Hospital Problem: Principal Problem:   Sepsis ([place])  Past Medical History:  Past Medical History:  Diagnosis Date   Atrial fibrillation ([place])    Dysrhythmia    a-fib   High cholesterol    "RX made groin hurt" (08/14/2016)   Hypertension    Past Surgical History:  Past Surgical History:  Procedure Laterality Date   ANKLE FRACTURE SURGERY Left ~ 2012   "put rod and pins in"   ATRIAL FIBRILLATION ABLATION N/A 10/03/2019   Procedure: ATRIAL FIBRILLATION ABLATION;  Surgeon: Constance Haw, MD;  Location: [place] [facility];  Service: Cardiovascular;  Laterality: N/A;   COLECTOMY N/A 02/26/2021   Procedure: PARTIAL COLECTOMY AND COLOSTOMY;  Surgeon: Kieth Brightly, Arta Bruce, MD;  Location: [place];  Service: General;  Laterality: N/A;   COLONOSCOPY W/ BIOPSIES AND POLYPECTOMY  ~ 2000   FRACTURE SURGERY     LAPAROTOMY  02/26/2021   Procedure: EXPLORATORY LAPAROTOMY;  Surgeon: Mickeal Skinner, MD;  Location: [place];  Service: General;;   ORIF FIBULA FRACTURE  06/24/2012   Procedure: OPEN REDUCTION INTERNAL FIXATION (ORIF) FIBULA FRACTURE;  Surgeon: [name], MD;  Location: [place];  Service: Orthopedics;  Laterality: Left;  ORIF LEFT FIBULA,  POSSIBLE REPAIR OF SYNDESMOSIS    TEE WITHOUT CARDIOVERSION N/A 10/03/2019   Procedure: TRANSESOPHAGEAL ECHOCARDIOGRAM (TEE);  Surgeon: Constance Haw, MD;  Location: [place] [facility];  Service: Cardiovascular;  Laterality: N/A;    Assessment & Plan Clinical Impression: Patient is a 70 y.o. year old male with  recent admission to the hospital on 9/3 with h history of PAF status post ablation 10/03/2019 followed by Dr. [name] maintained on Eliquis, hyperlipidemia, hypertension, tobacco/alcohol use, benign essential tremors, prior diverticulitis.  Per chart review patient lives with spouse.Independent prior to admission.  1 level home 3 steps to entry.  Presented 02/20/2021 with increasing abdominal pain nausea and vomiting times several days as well as decrease in appetite and decreased urination.  On presentation to the ED he was mildly hypotensive with systolic blood pressure in the 70s.  Noted WBC of 21,000 as well as AKI with creatinine 3.2, lipase 22, lactic acid 1.3.  CT of abdomen pelvis showed inflammation of the sigmoid colon and a small amount of extraluminal gas, consistent with perforated diverticulitis and abscess.  He was started on Zosyn in the ED as well as IV fluids and low-dose Levophed.  A nasogastric tube for gastric decompression.  Close monitoring of blood pressure as well as history of PAF requiring intravenous amiodarone with echocardiogram completed 02/23/2021 showing ejection fraction of 60 to 65%.  Patient was seen in consultation by interventional radiology 02/24/2021 for aspiration and drain placement of intra-abdominal fluid collection per Dr.Jon Mugweru and later underwent exploratory laparotomy, sigmoid colectomy with end colostomy creation drainage of intra-abdominal abscess with wound VAC placement 02/26/2021 per surgery Dr.  Kieth Brightly.  Hospital course complicated by bouts of delirium developed postoperative anemia required 1 unit packed red blood cells with latest hemoglobin of 9.  Patient was cleared to resume Eliquis as prior to admission for PAF.  Urology consulted 03/02/2021  due to right scrotal pain with scrotal ultrasound demonstrating findings consistent with right epididymitis and a reactive complex hydrocele.  Developed VRE UTI placed on Zyvox through 04/04/2021 and maintained on  contact precautions.   Developed left knee pain findings of positive effusion underwent left knee aspiration  injection with good toleration, fluid showed no crystals, gram stain with no organisms.  In regards to patient's AKI initially maintained on TPN renal function much improved latest creatinine 0.8 to monitoring of sodium 128.[name]  Patient transferred to CIR on 04/06/2021 .    Patient currently requires min with basic self-care skills secondary to muscle weakness, decreased cardiorespiratoy endurance, decreased coordination,  , decreased attention, decreased awareness, decreased problem solving, decreased safety awareness, decreased memory, and delayed processing, and decreased sitting balance, decreased standing balance, decreased postural control, and decreased balance strategies.  Prior to hospitalization, patient could complete BADLs with independent .  Patient will benefit from skilled intervention to decrease level of assist with basic self-care skills and increase independence with basic self-care skills prior to discharge home with care partner.  Anticipate patient will require 24 hour supervision and follow up home health.  OT - End of Session Activity Tolerance: Tolerates 10 - 20 min activity with multiple rests Endurance Deficit: Yes Endurance Deficit Description: Requires frequent rest breaks due to deconditioning OT Assessment Rehab Potential (ACUTE ONLY): Good OT Patient demonstrates impairments in the following area(s): Balance;Behavior;Cognition;Endurance;Motor;Pain;Safety;Sensory;Vision;Perception OT Basic ADL's Functional Problem(s): Eating;Grooming;Bathing;Dressing;Toileting OT Transfers Functional Problem(s): Tub/Shower;Toilet OT Plan OT Intensity: Minimum of 1-2 x/day, 45 to 90 minutes OT Frequency: 5 out of 7 days OT Duration/Estimated Length of Stay: 7-10 days OT Treatment/Interventions: Balance/vestibular training;Cognitive remediation/compensation;Community  reintegration;Discharge planning;Disease mangement/prevention;DME/adaptive equipment instruction;Functional mobility training;Pain management;Patient/family education;Psychosocial support;Self Care/advanced ADL retraining;Skin care/wound managment;Therapeutic Activities;Therapeutic Exercise;UE/LE Strength taining/ROM;Visual/perceptual remediation/compensation;UE/LE Coordination activities;Wheelchair propulsion/positioning OT Self Feeding Anticipated Outcome(s): Supervision OT Basic Self-Care Anticipated Outcome(s): Supervision OT Toileting Anticipated Outcome(s): Supervision OT Bathroom Transfers Anticipated Outcome(s): Supervision OT Recommendation Patient destination: Home Follow Up Recommendations: Home health OT Equipment Recommended: To be determined   OT Evaluation Precautions/Restrictions  Precautions Precautions: Fall Precaution Comments: abdominal incision, watch HR and BP Restrictions Weight Bearing Restrictions: No General Chart Reviewed: Yes Vital Signs Therapy Vitals Pulse Rate: 90 Resp: 17 BP: 107/64 Patient Position (if appropriate): Lying Oxygen Therapy SpO2: 96 % O2 Device: Room Air Pain Pain Assessment Pain Scale: 0-10 Pain Score: 0-No pain Faces Pain Scale: Hurts a little bit (pt originally stated no pain, but with c/o L medial knee pain midsession.) Pain Location: Generalized Pain Onset: Gradual Pain Intervention(s): Distraction;Repositioned Home Living/Prior Functioning Home Living Family/patient expects to be discharged to:: Private residence Living Arrangements: Spouse/significant other Available Help at Discharge: Family, Available 24 hours/day Type of Home: House Home Access: Stairs to enter Technical brewer of Steps: 3 Entrance Stairs-Rails: Right Home Layout: One level Bathroom Shower/Tub: Multimedia programmer: Standard Bathroom Accessibility: Yes Additional Comments: Pt reports wife not being able to assist 24/7  Lives  With: Spouse IADL History Homemaking Responsibilities: No Current License: No (pt recently stopped driving) Occupation: Retired Type of Occupation: apartment repairs Leisure and Hobbies: watches TV on couch Prior Function Level of Independence: Independent with basic ADLs, Independent with transfers, Independent with homemaking with ambulation  Able to Take Stairs?: Yes Driving: No Vocation: Retired Comments:  (wife present - states that he sat on the couch most of the day) Vision Baseline Vision/History: 1 Wears glasses Ability to See in Adequate Light: 2 Moderately impaired Patient Visual Report: Blurring of vision  Vision Assessment?: No apparent visual deficits Perception  Perception: Within Functional Limits Praxis Praxis: Intact Cognition Overall Cognitive Status: Impaired/Different from baseline (wife reports cog status completely different since he's been in hospital) Arousal/Alertness: Awake/alert Orientation Level: Person;Place;Situation Person: Oriented Place: Oriented Situation: Oriented Year: 2021 (able to pick correct year with choice of 2) Month: October Day of Week: Correct Memory: Impaired Memory Impairment: Storage deficit;Decreased recall of new information;Decreased short term memory;Retrieval deficit Decreased Short Term Memory: Verbal basic;Functional basic Immediate Memory Recall: Sock;Blue;Bed Memory Recall Sock: Not able to recall (could recall with choice of 2) Memory Recall Blue: Without Cue Memory Recall Bed: Not able to recall (could not recall with choice of 2) Attention: Focused Focused Attention: Impaired Focused Attention Impairment: Verbal basic;Functional basic Awareness: Impaired Awareness Impairment: Intellectual impairment Problem Solving: Appears intact Safety/Judgment: Impaired Sensation Sensation Light Touch: Appears Intact Hot/Cold: Appears Intact Proprioception: Not tested (difficutly understanding directions) Stereognosis:  Not tested  Coordination Gross Motor Movements are Fluid and Coordinated: No Fine Motor Movements are Fluid and Coordinated: No Coordination and Movement Description: Impacted by pain, fatigue, and impaired vision Finger Nose Finger Test: Significant dymetria bilaterally, impaired vision L > R eye Heel Shin Test: NT 9 Hole Peg Test: NT Motor  Motor Motor: Within Functional Limits Motor - Skilled Clinical Observations: limited by pain in B shoulders  Trunk/Postural Assessment  Cervical Assessment Cervical Assessment: Exceptions to [facility] (forward head) Thoracic Assessment Thoracic Assessment: Exceptions to [facility] (rounded shoulders) Lumbar Assessment Lumbar Assessment: Exceptions to [facility] (posterior pelvic tilt) Postural Control Postural Control: Within Functional Limits  Balance Balance Balance Assessed: Yes Static Sitting Balance Static Sitting - Balance Support: Feet supported;Bilateral upper extremity supported Static Sitting - Level of Assistance: 5: Stand by assistance Dynamic Sitting Balance Dynamic Sitting - Balance Support: Feet supported Dynamic Sitting - Level of Assistance: 5: Stand by assistance Dynamic Sitting - Balance Activities: Lateral lean/weight shifting;Forward lean/weight shifting Sitting balance - Comments: able to sit EOB without support Static Standing Balance Static Standing - Balance Support: During functional activity;Bilateral upper extremity supported Static Standing - Level of Assistance: 5: Stand by assistance Dynamic Standing Balance Dynamic Standing - Balance Support: During functional activity;Bilateral upper extremity supported Dynamic Standing - Level of Assistance: 5: Stand by assistance Extremity/Trunk Assessment RUE Assessment RUE Assessment: Within Functional Limits General Strength Comments:  (MMT not performed d/t pain in shoulders with facial grimacing) LUE Assessment LUE Assessment: Within Functional Limits General Strength Comments:   (MMT not performed d/t c/o pain shoulders)  Care Tool Care Tool Self Care Eating        Oral Care    Oral Care Assist Level: Set up assist    Bathing   Body parts bathed by patient: Left upper leg;Right upper leg;Front perineal area;Left arm;Right arm;Face;Left lower leg;Right lower leg   Body parts n/a: Chest;Abdomen;Buttocks Assist Level: Minimal Assistance - Patient > 75%    Upper Body Dressing(including orthotics)   What is the patient wearing?: Pull over shirt   Assist Level: Contact Guard/Touching assist    Lower Body Dressing (excluding footwear)   What is the patient wearing?: Pants Assist for lower body dressing: Moderate Assistance - Patient 50 - 74%    Putting on/Taking off footwear   What is the patient wearing?: Non-skid slipper socks Assist for footwear: Maximal Assistance - Patient 25 - 49%       Care Tool Toileting Toileting activity Toileting Activity did not occur (Clothing management and hygiene only): N/A (no void or bm) (foley and ostomy)  Care Tool Bed Mobility Roll left and right activity   Roll left and right assist level: Supervision/Verbal cueing    Sit to lying activity   Sit to lying assist level: Supervision/Verbal cueing    Lying to sitting on side of bed activity   Lying to sitting on side of bed assist level: the ability to move from lying on the back to sitting on the side of the bed with no back support.: Contact Guard/Touching assist     Care Tool Transfers Sit to stand transfer   Sit to stand assist level: Minimal Assistance - Patient > 75%    Chair/bed transfer Chair/bed transfer activity did not occur: Safety/medical concerns (Fatigue, agitation, pain)       Toilet transfer   Assist Level: Minimal Assistance - Patient > 75%     Care Tool Cognition  Expression of Ideas and Wants Expression of Ideas and Wants: 3. Some difficulty - exhibits some difficulty with expressing needs and ideas (e.g, some words or finishing  thoughts) or speech is not clear  Understanding Verbal and Non-Verbal Content Understanding Verbal and Non-Verbal Content: 2. Sometimes understands - understands only basic conversations or simple, direct phrases. Frequently requires cues to understand   Memory/Recall Ability Memory/Recall Ability : Current season;That he or she is in a hospital/hospital unit   Refer to Care Plan for [place] 1 OT Short Term Goal 1 (Week 1): Pt will perform consistently perform sit > stands CGA  with LRAD OT Short Term Goal 2 (Week 1): Pt will don pants with MIN A OT Short Term Goal 3 (Week 1): Pt will tolerate sitting EOB/EOM for 6 min in prep for grooming with close (S)  Recommendations for other services: None    Skilled Therapeutic Intervention Pt & wife educated on OT role/purpose, CIR, ELOS, and recovery. Details of evaluation listed above and below. Pt with mild irritation at mention of "morning" by OTS d/t deficits in time of day awareness. BP assessed supine at 108/66 (79). Overall, pt can tolerate sitting EOB with feet supported for several minutes, requires setup for UB dressing, mod for LB dressing and performs sit > stands using RW with MIN A. Pt's wife able to provide information about PLOF during eval. Wife reports cognitive changes from baseline. Pt initially with no c/o pain - wife reports that he has been c/o pain but pt denies. Pt c/o pain mid-evaluation on medial side of R knee. Wife reports that their home is wc accessible and is one level. Pt completes bathing and dressing activities EOB with c/o dizziness. Sit > stand with min A to stand > pivot to wc for grooming at sink level. Able to complete functional mobility with RW to door from sink and back. Pt stating that he did not want to wear his clothes at bedtime - needed visual cuing to be reminded that it was day, not night. Pt left sitting up in wc, wife present, with alarm belt on and needs met.    ADL ADL Grooming: Setup;Minimal cueing Where Assessed-Grooming: Wheelchair;Sitting at sink Upper Body Bathing: Minimal assistance Where Assessed-Upper Body Bathing: Edge of bed Lower Body Bathing: Moderate assistance Where Assessed-Lower Body Bathing: Edge of bed Upper Body Dressing: Setup Where Assessed-Upper Body Dressing: Edge of bed Lower Body Dressing: Moderate assistance Where Assessed-Lower Body Dressing: Edge of bed Toileting: Unable to assess (foley & ostomy) Where Assessed-Toileting: Other (Comment) (N/A) Toilet Transfer: Minimal assistance Toilet Transfer Method: Ambulating;Stand pivot (RW)  Science writer: Radiographer, therapeutic: Unable to assess (safety/medical) ADL Comments:  (some ADLs not assessed d/t incision and ostomy) Mobility  Bed Mobility Bed Mobility: Rolling Right;Rolling Left;Supine to Sit;Sitting - Scoot to [employer] of Bed;Sit to Supine;Scooting to [facility] Rolling Right: Supervision/verbal cueing Rolling Left: Supervision/Verbal cueing Supine to Sit: Contact Guard/Touching assist Sitting - Scoot to Edge of Bed: Contact Guard/Touching assist Sit to Supine: Contact Guard/Touching assist Scooting to HOB: 2 Helpers Transfers Sit to Stand: Minimal Assistance - Patient > 75% Stand to Sit: Contact Guard/Touching assist   Discharge Criteria: Patient will be discharged from OT if patient refuses treatment 3 consecutive times without medical reason, if treatment goals not met, if there is a change in medical status, if patient makes no progress towards goals or if patient is discharged from hospital.  The above assessment, treatment plan, treatment alternatives and goals were discussed and mutually agreed upon: by patient and by family  Romel Dumond 04/07/2021, 1:05 PM

## 2021-04-07 NOTE — Evaluation (Signed)
Speech Language Pathology Assessment and Plan  Patient Details  Name: Travis Conrad MRN: [id number] Date of Birth: Jul 22, 1950  SLP Diagnosis: Cognitive Impairments  Rehab Potential: Good ELOS: 7-10 days    Today's Date: 04/07/2021 SLP Individual Time: 0905-1000 SLP Individual Time Calculation (min): 55 min   Hospital Problem: Principal Problem:   Sepsis ([place])  Past Medical History:  Past Medical History:  Diagnosis Date   Atrial fibrillation ([place])    Dysrhythmia    a-fib   High cholesterol    "RX made groin hurt" (08/14/2016)   Hypertension    Past Surgical History:  Past Surgical History:  Procedure Laterality Date   ANKLE FRACTURE SURGERY Left ~ 2012   "put rod and pins in"   ATRIAL FIBRILLATION ABLATION N/A 10/03/2019   Procedure: ATRIAL FIBRILLATION ABLATION;  Surgeon: Constance Haw, MD;  Location: [facility];  Service: Cardiovascular;  Laterality: N/A;   COLECTOMY N/A 02/26/2021   Procedure: PARTIAL COLECTOMY AND COLOSTOMY;  Surgeon: Kieth Brightly, Arta Bruce, MD;  Location: [facility];  Service: General;  Laterality: N/A;   COLONOSCOPY W/ BIOPSIES AND POLYPECTOMY  ~ 2000   FRACTURE SURGERY     LAPAROTOMY  02/26/2021   Procedure: EXPLORATORY LAPAROTOMY;  Surgeon: Mickeal Skinner, MD;  Location: [facility];  Service: General;;   ORIF FIBULA FRACTURE  06/24/2012   Procedure: OPEN REDUCTION INTERNAL FIXATION (ORIF) FIBULA FRACTURE;  Surgeon: [name], MD;  Location: [employer] [facility];  Service: Orthopedics;  Laterality: Left;  ORIF LEFT FIBULA,  POSSIBLE REPAIR OF SYNDESMOSIS    TEE WITHOUT CARDIOVERSION N/A 10/03/2019   Procedure: TRANSESOPHAGEAL ECHOCARDIOGRAM (TEE);  Surgeon: Constance Haw, MD;  Location: [facility];  Service: Cardiovascular;  Laterality: N/A;    Assessment / Plan / Recommendation  [name] is a 70 year old right-handed male with history of PAF status post ablation 10/03/2019 hyperlipidemia, hypertension, tobacco/alcohol  use, benign essential tremors, prior diverticulitis.   Presented 02/20/2021 with increasing abdominal pain nausea and vomiting times several days as well as decrease in appetite and decreased urination.  On presentation to the ED he was mildly hypotensive with systolic blood pressure in the 70s.  CT of abdomen pelvis showed inflammation of the sigmoid colon and a small amount of extraluminal gas, consistent with perforated diverticulitis and abscess.  A nasogastric tube for gastric decompression. Patient was seen in consultation by interventional radiology 02/24/2021 for aspiration and drain placement of intra-abdominal fluid collection later underwent exploratory laparotomy, sigmoid colectomy with end colostomy creation drainage of intra-abdominal abscess with wound VAC placement 02/26/2021. Hospital course complicated by bouts of delirium developed postoperative anemia required 1 unit packed red blood cells with latest hemoglobin of 9.   Developed VRE UTI placed on Zyvox through 04/04/2021 and maintained on contact precautions.  In regards to patient's AKI initially maintained on TPN renal function much improved latest creatinine 0.8 to monitoring of sodium 128.  Patient is currently tolerating a soft diet. Therapy evaluations completed due to patient decreased functional mobility was admitted for a comprehensive rehab program.  Clinical Impression Patient presents with moderately impaired cognitive function as per this testing. He participated in completing the Cognistat test and was in the "mild impairment" range for Orientation and Reasoning and "moderate impairment" range for Attention and Memory. Patient himself reported that he had some memory difficulties prior to this hospitalization but that "its worse" now. Patient exhibited difficulty with auditory comprehension of instructions however this appeared due to his poor attention and  working Marine scientist. He did not demonstrate adequate understanding of his deficits.  He was oriented to place, self, month but not year (stated 2021), date or day of week. He became confused regarding time of day, thinking it was 10 PM rather than AM. At this time, patient will require skilled SLP intervention to maximize his cognitive function prior to discharge home with family.  Skilled Therapeutic Interventions          SLE, Cognistat  SLP Assessment  Patient will need skilled Speech Lanaguage Pathology Services during CIR admission     Recommendations  Recommendations for Other Services: Neuropsych consult Patient destination: Home Follow up Recommendations: 24 hour supervision/assistance Equipment Recommended: None recommended by SLP    SLP Frequency 3 to 5 out of 7 days   SLP Duration  SLP Intensity  SLP Treatment/Interventions 7-10 days  Minumum of 1-2 x/day, 30 to 90 minutes  Cognitive remediation/compensation;Internal/external aids;Medication managment;Environmental controls;Functional tasks;Patient/family education    Pain Pain Assessment Pain Type: Acute pain Pain Location: Pelvis Pain Orientation: Left Pain Descriptors / Indicators: Grimacing;Discomfort;Cramping Pain Onset: Sudden Pain Intervention(s): Other (Comment) (RN already aware) Multiple Pain Sites: Yes 2nd Pain Site Pain Score: 6 Pain Type: Chronic pain Pain Location: Knee Pain Descriptors / Indicators: Aching;Discomfort Pain Frequency: Intermittent Pain Onset: Gradual  Prior Functioning Cognitive/Linguistic Baseline: Baseline deficits Baseline deficit details: patient reports baseline memory impairment but "its worse" now Type of Home: House  Lives With: Spouse Available Help at Discharge: Family;Available 24 hours/day Education: 12th grade Vocation: Retired (worked in apartment maintenance)  SLP Evaluation Cognition Overall Cognitive Status: Impaired/Different from baseline Arousal/Alertness: Awake/alert Orientation Level: Oriented to person;Oriented to place;Disoriented to  time Year: 2021 Month: October Day of Week: Incorrect Attention: Focused Focused Attention: Impaired Focused Attention Impairment: Verbal basic;Functional basic Memory: Impaired Memory Impairment: Retrieval deficit;Decreased recall of new information;Other (comment) (recalled 2/4 words with category cue after 4 minute delay and recalled other two words with 3-choice cue) Decreased Short Term Memory: Verbal basic Awareness: Impaired Awareness Impairment: Intellectual impairment Problem Solving: Impaired Problem Solving Impairment: Verbal complex Safety/Judgment: Impaired  Comprehension Auditory Comprehension Overall Auditory Comprehension: Other (comment) (appears to be impaired largely secondary to patient's decreased attention and recall) Yes/No Questions: Within Functional Limits Commands: Within Functional Limits Interfering Components: Processing speed;Working Marine scientist;Attention EffectiveTechniques: Extra processing time Visual Recognition/Discrimination Discrimination: Not tested Reading Comprehension Reading Status: Not tested Expression Expression Primary Mode of Expression: Verbal Verbal Expression Overall Verbal Expression: Appears within functional limits for tasks assessed Written Expression Written Expression: Not tested Oral Motor Oral Motor/Sensory Function Overall Oral Motor/Sensory Function: Within functional limits Motor Speech Overall Motor Speech: Appears within functional limits for tasks assessed Respiration: Within functional limits Resonance: Within functional limits Articulation: Within functional limitis Intelligibility: Intelligible Motor Planning: Witnin functional limits Motor Speech Errors: Not applicable  Care Tool Care Tool Cognition Ability to hear (with hearing aid or hearing appliances if normally used Ability to hear (with hearing aid or hearing appliances if normally used): 1. Minimal difficulty - difficulty in some environments (e.g.  when person speaks softly or setting is noisy)   Expression of Ideas and Wants Expression of Ideas and Wants: 3. Some difficulty - exhibits some difficulty with expressing needs and ideas (e.g, some words or finishing thoughts) or speech is not clear   Understanding Verbal and Non-Verbal Content Understanding Verbal and Non-Verbal Content: 2. Sometimes understands - understands only basic conversations or simple, direct phrases. Frequently requires cues to understand  Memory/Recall Ability Memory/Recall Ability : Current season;That he  or she is in a hospital/hospital unit     Short Term Goals: Week 1: SLP Short Term Goal 1 (Week 1): Patient will use visual aides to orient to time with minA cues to initiate use. SLP Short Term Goal 2 (Week 1): Patient will sustain attention to basic to mildly complex tasks with modA verbal and visual cues. SLP Short Term Goal 3 (Week 1): Patient will demonstrate adequate safety awareness during ADL's and simulated functional tasks with min-modA verbal and visual cues. SLP Short Term Goal 4 (Week 1): Patient will recall specific information (strategies, exercises, precautions, etc) from PT, OT, ST with modA verbal cues.  Refer to Care Plan for Long Term Goals  Recommendations for other services: Neuropsych  Discharge Criteria: Patient will be discharged from SLP if patient refuses treatment 3 consecutive times without medical reason, if treatment goals not met, if there is a change in medical status, if patient makes no progress towards goals or if patient is discharged from hospital.  The above assessment, treatment plan, treatment alternatives and goals were discussed and mutually agreed upon: by patient and by family  Sonia Baller, MA, CCC-SLP Speech Therapy

## 2021-04-07 NOTE — Progress Notes (Signed)
Initial Nutrition Assessment  DOCUMENTATION CODES:   Not applicable  INTERVENTION:  TPN discontinued.   Provide Ensure Enlive po TID, each supplement provides 350 kcal and 20 grams of protein  Provide 30 ml Prosource plus po TID, each supplement provides 100 kcal and 15 grams of protein.   Encourage adequate PO intake.   NUTRITION DIAGNOSIS:   Increased nutrient needs related to post-op healing as evidenced by estimated needs.  GOAL:   Patient will meet greater than or equal to 90% of their needs  MONITOR:   PO intake, Supplement acceptance, Labs, Weight trends, Skin, I & O's  REASON FOR ASSESSMENT:    (TPN)    ASSESSMENT:   70 year old male with history of PAF, hyperlipidemia, hypertension, tobacco/alcohol use, benign essential tremors, prior diverticulitis. Presented with increasing abdominal pain nausea and vomiting times several days. CT of abdomen pelvis showed inflammation of the sigmoid colon and a small amount of extraluminal gas, consistent with perforated diverticulitis and abscess. Aspiration and drain placement of intra-abdominal fluid collection and underwent exploratory laparotomy, sigmoid colectomy with end colostomy creation drainage of intra-abdominal abscess. TPN initiated 9/09.  Patient is currently tolerating a soft diet. Therapy evaluations completed due to patient decreased functional mobility was admitted for a comprehensive rehab program.  Calorie count completed yesterday with 88% of kcal needs met and 74% of protein needs met. Discussed nutrition plan with Pharmacy. Plans to discontinue TPN today as nutritional intake has improved. Pt currently has Ensure and Prosource plus ordered and has been consuming them. RD to continue with current orders to aid in caloric and protein needs. RD to continue to monitor for PO adequacy. Unable to complete Nutrition-Focused physical exam at this time.   Labs and medications reviewed.   Diet Order:   Diet Order              DIET SOFT Room service appropriate? No; Fluid consistency: Thin  Diet effective now                   EDUCATION NEEDS:   Not appropriate for education at this time  Skin:  Skin Assessment: Skin Integrity Issues: Skin Integrity Issues:: Incisions Incisions: abdomen  Last BM:  10/19  Height:   Ht Readings from Last 1 Encounters:  04/06/21 6' (1.829 m)    Weight:   Wt Readings from Last 1 Encounters:  04/07/21 90.3 kg    BMI:  Body mass index is 26.99 kg/m.  Estimated Nutritional Needs:   Kcal:  2100-2300  Protein:  105-115 grams  Fluid:  >/= 2 L/day  Corrin Parker, MS, RD, LDN RD pager number/after hours weekend pager number on Amion.

## 2021-04-07 NOTE — Progress Notes (Signed)
Occupational Therapy Session Note  Patient Details  Name: Travis Conrad MRN: [id number] Date of Birth: 04/10/1951  Today's Date: 04/07/2021 OT Individual Time: 1470-9295 OT Individual Time Calculation (min): 27 min    Short Term Goals: Week 1:  OT Short Term Goal 1 (Week 1): Pt will perform consistently perform sit > stands CGA  with LRAD OT Short Term Goal 2 (Week 1): Pt will don pants with MIN A OT Short Term Goal 3 (Week 1): Pt will tolerate sitting EOB/EOM for 6 min in prep for grooming with close (S)  Skilled Therapeutic Interventions/Progress Updates:     Pt received in bed with pain in R knee reported. Pt declines all interventions offered (ice/repositioning). Rest and squat pivots utilized for pain management with MIN A. Escorted pt total A for time management to tower hall for sunlight as pt reporting he is going "sit crazy"  Therapeutic exercise Pt completes 2x10-15 dowel rod therex for BUE shoulder strengthening required for BADLs/functional transfers as follows with demo cuing and 3 # dowel rod  Shoulder flex/ext, shoulder press,  Elbow flex/ext, chest press  Pt reporting mild pain with shoulder raise but improved with rest  Pt left at end of session in bed with exit alarm on, call light in reach and all needs met   Therapy Documentation Precautions:  Restrictions Weight Bearing Restrictions: No General:     Therapy/Group: Individual Therapy  Tonny Branch 04/07/2021, 6:54 AM

## 2021-04-07 NOTE — Progress Notes (Signed)
PROGRESS NOTE   Subjective/Complaints: C/o itchiness with rash in right lower extremity- already has hydrocortisone cream ordered and will order sarna lotion and consult dermatology and it appears like it could be drug-related.   ROS: +rash on right lower extremity  Objective:   No results found. Recent Labs    04/05/21 0238 04/07/21 0615  WBC 10.3 11.3*  HGB 9.0* 9.0*  HCT 28.7* 29.0*  PLT 369 386   Recent Labs    04/07/21 0615  NA 132*  K 4.2  CL 96*  CO2 28  GLUCOSE 142*  BUN 24*  CREATININE 0.81  CALCIUM 8.6*    Intake/Output Summary (Last 24 hours) at 04/07/2021 1645 Last data filed at 04/07/2021 1300 Gross per 24 hour  Intake 200 ml  Output 1850 ml  Net -1650 ml        Physical Exam: Vital Signs Blood pressure 107/64, pulse 90, temperature 98.3 F (36.8 C), temperature source Oral, resp. rate 17, height 6' (1.829 m), weight 90.3 kg, SpO2 96 %. Gen: no distress, normal appearing HEENT: oral mucosa pink and moist, NCAT Cardio: Reg rate Chest: normal effort, normal rate of breathing Abdominal:     Comments: Colostomy in place.  Neurological/MSK:    Comments: Patient is alert.  Mildly anxious.  Makes eye contact with examiner.  Follows commands.  Provides name and age. Left upper extremity resting tremor. 4/5 strength throughout lower extremities. Upper extremity MMT limited my pain in shoulders.  Skin: Petichiae on right lower extremity   Assessment/Plan: 1. Functional deficits which require 3+ hours per day of interdisciplinary therapy in a comprehensive inpatient rehab setting. Physiatrist is providing close team supervision and 24 hour management of active medical problems listed below. Physiatrist and rehab team continue to assess barriers to discharge/monitor patient progress toward functional and medical goals  Care Tool:  Bathing    Body parts bathed by patient: Left upper leg, Right  upper leg, Front perineal area, Left arm, Right arm, Face, Left lower leg, Right lower leg     Body parts n/a: Chest, Abdomen, Buttocks   Bathing assist Assist Level: Minimal Assistance - Patient > 75%     Upper Body Dressing/Undressing Upper body dressing   What is the patient wearing?: Pull over shirt    Upper body assist Assist Level: Contact Guard/Touching assist    Lower Body Dressing/Undressing Lower body dressing      What is the patient wearing?: Pants     Lower body assist Assist for lower body dressing: Moderate Assistance - Patient 50 - 74%     Toileting Toileting Toileting Activity did not occur Landscape architect and hygiene only): N/A (no void or bm) (foley and ostomy)  Toileting assist Assist for toileting: Maximal Assistance - Patient 25 - 49%     Transfers Chair/bed transfer  Transfers assist  Chair/bed transfer activity did not occur: Safety/medical concerns (Fatigue, agitation, pain)        Locomotion Ambulation   Ambulation assist      Assist level: Contact Guard/Touching assist Assistive device: Walker-rolling Max distance: 10'   Walk 10 feet activity   Assist     Assist level: Contact Guard/Touching assist  Assistive device: Walker-rolling   Walk 50 feet activity   Assist Walk 50 feet with 2 turns activity did not occur: Safety/medical concerns (Fatigue, agitation, pain)         Walk 150 feet activity   Assist Walk 150 feet activity did not occur: Safety/medical concerns (Fatigue, agitation, pain)         Walk 10 feet on uneven surface  activity   Assist Walk 10 feet on uneven surfaces activity did not occur: Safety/medical concerns (Fatigue, agitation, pain)         Wheelchair     Assist Is the patient using a wheelchair?: Yes Type of Wheelchair: Manual    Wheelchair assist level: Dependent - Patient 0% Max wheelchair distance: 0    Wheelchair 50 feet with 2 turns activity    Assist         Assist Level: Dependent - Patient 0%   Wheelchair 150 feet activity     Assist      Assist Level: Dependent - Patient 0%   Blood pressure 107/64, pulse 90, temperature 98.3 F (36.8 C), temperature source Oral, resp. rate 17, height 6' (1.829 m), weight 90.3 kg, SpO2 96 %.   Medical Problem List and Plan: 1.  Severe sepsis/ debility secondary to perforated diverticulitis status post exploratory laparotomy sigmoid colectomy with end colostomy drainage of intra-abdominal abscess with wound VAC placement 02/26/2021             -patient may shower             -ELOS/Goals: 5-7 days S  Initial CIR evaluations today 2.  Antithrombotics: -DVT/anticoagulation:  Pharmaceutical: Other (comment) Eliquis             -antiplatelet therapy: N/A 3. Pain: continue Lidoderm patch, Robaxin 1000 mg 4 times daily, oxycodone as needed 4. Mood: Ativan 1 mg every 6 hours as needed             -antipsychotic agents: N/A 5. Neuropsych: This patient is capable of making decisions on his own behalf. 6. Skin/Wound Care: Routine skin checks/colostomy care 7. Fluids/Electrolytes/Nutrition: Routine in and outs with follow-up chemistries 8.  Postoperative anemia.   Follow-up CBC 9.  Paroxysmal atrial fibrillation.  Amiodarone 200 mg daily, Cardizem 60 mg every 6 hours.  Follow-up cardiology services 10.  VRE UTI.  Completed course of Zyvox 04/04/2021.  Contact precautions. 11.  AKI.  Resolved.  Follow-up chemistries 12.  Tremors.  Patient was on sotalol at home and currently on hold 13.  Hypertension.  Well controlled. Decrease Lasix to 10 mg daily given frequent urination.  Monitor with increased mobility. Check magnesium level tomorrow.  14.  Right epididymitis.  Resolved.  Completed course of doxycycline.  Follow-up urology services as needed 15.  Left knee arthritis/effusion.  Responded well to steroid injection. 16.  History of alcohol tobacco abuse.  NicoDerm patch.  Provide counseling 17.   Anasarca.  Decreased nutritional storage.  Patient maintained on TPN at half rate.  Marinol added for appetite stimulant 18.  Tobacco abuse.  NicoDerm patch.  Counseling 19. Insomnia: start Rozarem HS. Resolved. 20. Suboptimal magnesium levels: d/c colace and start magnesium gluconate 250mg  HS. Also receiving magnesium sulfate in TPN.  21. Vitamin D deficiency: level 14 on 10/20. Ergocalciferol 50,000U once per week for 7 weeks ordered. 22. Inadequate oral intake: resolved. D/c TPN.    LOS: 1 days A FACE TO FACE EVALUATION WAS PERFORMED  Clide Deutscher Tyaisha Cullom 04/07/2021, 4:45 PM

## 2021-04-08 DIAGNOSIS — A419 Sepsis, unspecified organism: Secondary | ICD-10-CM | POA: Diagnosis not present

## 2021-04-08 DIAGNOSIS — I10 Essential (primary) hypertension: Secondary | ICD-10-CM

## 2021-04-08 DIAGNOSIS — D62 Acute posthemorrhagic anemia: Secondary | ICD-10-CM | POA: Diagnosis not present

## 2021-04-08 DIAGNOSIS — E871 Hypo-osmolality and hyponatremia: Secondary | ICD-10-CM

## 2021-04-08 LAB — GLUCOSE, CAPILLARY
Glucose-Capillary: 125 mg/dL — ABNORMAL HIGH (ref 70–99)
Glucose-Capillary: 112 mg/dL — ABNORMAL HIGH (ref 70–99)
Glucose-Capillary: 125 mg/dL — ABNORMAL HIGH (ref 70–99)

## 2021-04-08 LAB — CBC WITH DIFFERENTIAL/PLATELET
Abs Immature Granulocytes: 0.07 10*3/uL (ref 0.00–0.07)
Basophils Absolute: 0 10*3/uL (ref 0.0–0.1)
Basophils Relative: 0 %
Eosinophils Absolute: 0.2 10*3/uL (ref 0.0–0.5)
Eosinophils Relative: 2 %
HCT: 28.7 % — ABNORMAL LOW (ref 39.0–52.0)
Hemoglobin: 8.9 g/dL — ABNORMAL LOW (ref 13.0–17.0)
Immature Granulocytes: 1 %
Lymphocytes Relative: 31 %
Lymphs Abs: 3.2 10*3/uL (ref 0.7–4.0)
MCH: 28.8 pg (ref 26.0–34.0)
MCHC: 31 g/dL (ref 30.0–36.0)
MCV: 92.9 fL (ref 80.0–100.0)
Monocytes Absolute: 1.2 10*3/uL — ABNORMAL HIGH (ref 0.1–1.0)
Monocytes Relative: 12 %
Neutro Abs: 5.6 10*3/uL (ref 1.7–7.7)
Neutrophils Relative %: 54 %
Platelets: 378 10*3/uL (ref 150–400)
RBC: 3.09 MIL/uL — ABNORMAL LOW (ref 4.22–5.81)
RDW: 16.3 % — ABNORMAL HIGH (ref 11.5–15.5)
WBC: 10.4 10*3/uL (ref 4.0–10.5)
nRBC: 0 % (ref 0.0–0.2)

## 2021-04-08 MED ORDER — DICLOFENAC SODIUM 1 % EX GEL
2.0000 g | Freq: Four times a day (QID) | CUTANEOUS | Status: DC
  Administered 2021-04-08 – 2021-04-19 (×33): 2 g via TOPICAL
  Filled 2021-04-08: qty 100

## 2021-04-08 NOTE — Progress Notes (Signed)
PROGRESS NOTE   Subjective/Complaints: Patient seen sitting up in bed this morning.  He states he slept well overnight.  He is working with therapies.  He states he feels "crappy" this morning, but cannot identify a reason.    ROS: Denies CP, SOB, N/V/D, fevers, chills, abdominal discomfort.  Objective:   No results found. Recent Labs    04/07/21 0615 04/08/21 0510  WBC 11.3* 10.4  HGB 9.0* 8.9*  HCT 29.0* 28.7*  PLT 386 378    Recent Labs    04/07/21 0615  NA 132*  K 4.2  CL 96*  CO2 28  GLUCOSE 142*  BUN 24*  CREATININE 0.81  CALCIUM 8.6*     Intake/Output Summary (Last 24 hours) at 04/08/2021 1056 Last data filed at 04/08/2021 0902 Gross per 24 hour  Intake 238 ml  Output 1270 ml  Net -1032 ml         Physical Exam: Vital Signs Blood pressure 103/71, pulse 86, temperature 98.1 F (36.7 C), resp. rate 18, height 6' (1.829 m), weight 89.8 kg, SpO2 98 %. Constitutional: No distress . Vital signs reviewed. HENT: Normocephalic.  Atraumatic. Eyes: EOMI. No discharge. Cardiovascular: No JVD.  RRR. Respiratory: Normal effort.  No stridor.  Bilateral clear to auscultation. GI: Non-distended.  BS +.  + Colostomy. Skin: Warm and dry.  Intact. Psych: Normal mood.  Normal behavior. Musc: No edema in extremities.  No tenderness in extremities. Neuro: Alert Makes eye contact with examiner.   Follows commands.   Motor:  [name]: HF, KE 2+/5 (pain inhibition) [name]: 3/5 proximal to distal  Assessment/Plan: 1. Functional deficits which require 3+ hours per day of interdisciplinary therapy in a comprehensive inpatient rehab setting. Physiatrist is providing close team supervision and 24 hour management of active medical problems listed below. Physiatrist and rehab team continue to assess barriers to discharge/monitor patient progress toward functional and medical goals  Care Tool:  Bathing    Body parts  bathed by patient: Left upper leg, Right upper leg, Front perineal area, Left arm, Right arm, Face, Left lower leg, Right lower leg     Body parts n/a: Chest, Abdomen, Buttocks   Bathing assist Assist Level: Minimal Assistance - Patient > 75%     Upper Body Dressing/Undressing Upper body dressing   What is the patient wearing?: Pull over shirt    Upper body assist Assist Level: Contact Guard/Touching assist    Lower Body Dressing/Undressing Lower body dressing      What is the patient wearing?: Pants     Lower body assist Assist for lower body dressing: Maximal Assistance - Patient 25 - 49%     Toileting Toileting Toileting Activity did not occur (Clothing management and hygiene only): N/A (no void or bm) (foley and ostomy)  Toileting assist Assist for toileting: Dependent - Patient 0% (pt uses a urinal with help.)     Transfers Chair/bed transfer  Transfers assist  Chair/bed transfer activity did not occur: Safety/medical concerns (Fatigue, agitation, pain)        Locomotion Ambulation   Ambulation assist      Assist level: Contact Guard/Touching assist Assistive device: Walker-rolling Max distance: 10'  Walk 10 feet activity   Assist     Assist level: Contact Guard/Touching assist Assistive device: Walker-rolling   Walk 50 feet activity   Assist Walk 50 feet with 2 turns activity did not occur: Safety/medical concerns (Fatigue, agitation, pain)         Walk 150 feet activity   Assist Walk 150 feet activity did not occur: Safety/medical concerns (Fatigue, agitation, pain)         Walk 10 feet on uneven surface  activity   Assist Walk 10 feet on uneven surfaces activity did not occur: Safety/medical concerns (Fatigue, agitation, pain)         Wheelchair     Assist Is the patient using a wheelchair?: Yes Type of Wheelchair: Manual    Wheelchair assist level: Dependent - Patient 0% Max wheelchair distance: 0     Wheelchair 50 feet with 2 turns activity    Assist        Assist Level: Dependent - Patient 0%   Wheelchair 150 feet activity     Assist      Assist Level: Dependent - Patient 0%   Blood pressure 103/71, pulse 86, temperature 98.1 F (36.7 C), resp. rate 18, height 6' (1.829 m), weight 89.8 kg, SpO2 98 %.   Medical Problem List and Plan: 1.  Severe sepsis/ debility secondary to perforated diverticulitis status post exploratory laparotomy sigmoid colectomy with end colostomy drainage of intra-abdominal abscess with wound VAC placement 02/26/2021  Cont CIR 2.  Antithrombotics: -DVT/anticoagulation:  Pharmaceutical: Other (comment) Eliquis             -antiplatelet therapy: N/A 3. Pain: continue Lidoderm patch, Robaxin 1000 mg 4 times daily, oxycodone as needed  Controlled with meds on 10/21 4. Mood: Ativan 1 mg every 6 hours as needed             -antipsychotic agents: N/A 5. Neuropsych: This patient is capable of making decisions on his own behalf. 6. Skin/Wound Care: Routine skin checks/colostomy care 7. Fluids/Electrolytes/Nutrition: Routine in and outs 8.  Postoperative anemia.     Hemoglobin 8.9 on 10/21 9.  Paroxysmal atrial fibrillation.  Amiodarone 200 mg daily, Cardizem 60 mg every 6 hours.  Follow-up cardiology services  Monitor with increased exertion 10.  VRE UTI.  Completed course of Zyvox 04/04/2021.  Contact precautions. 11.  AKI.  Resolved.   12.  Tremors.  Patient was on sotalol at home and currently on hold 13.  Hypertension.   Decreased Lasix to 10 mg daily given frequent urination.    Soft on 10/21 14.  Right epididymitis.  Resolved.  Completed course of doxycycline.  Follow-up urology services as needed 15.  Left knee arthritis/effusion.  Responded well to steroid injection. 16.  History of alcohol tobacco abuse.  NicoDerm patch.  Provide counseling 17.  Anasarca.  Decreased nutritional storage.  Marinol added for appetite stimulant 18.   Tobacco abuse.  NicoDerm patch.  Counseling 19. Insomnia: start Rozarem HS. Resolved. 20. Suboptimal magnesium levels: d/c colace and started magnesium gluconate 250mg  HS.  21. Vitamin D deficiency: level 14 on 10/20. Ergocalciferol 50,000U once per week for 7 weeks ordered. 22. Inadequate oral intake: resolved. D/ced TPN. 23.  Hyponatremia  Sodium 132 on 10/20, labs ordered for Monday   LOS: 2 days A FACE TO FACE EVALUATION WAS PERFORMED  Omie Ferger Lorie Phenix 04/08/2021, 10:56 AM

## 2021-04-08 NOTE — Progress Notes (Signed)
Speech Language Pathology Daily Session Note  Patient Details  Name: Travis Conrad MRN: [id number] Date of Birth: [birth year]  Today's Date: 04/08/2021 SLP Individual Time: 1445-1530 SLP Individual Time Calculation (min): 45 min  Short Term Goals: Week 1: SLP Short Term Goal 1 (Week 1): Patient will use visual aides to orient to time with minA cues to initiate use. SLP Short Term Goal 2 (Week 1): Patient will sustain attention to basic to mildly complex tasks with modA verbal and visual cues. SLP Short Term Goal 3 (Week 1): Patient will demonstrate adequate safety awareness during ADL's and simulated functional tasks with min-modA verbal and visual cues. SLP Short Term Goal 4 (Week 1): Patient will recall specific information (strategies, exercises, precautions, etc) from PT, OT, ST with modA verbal cues.  Skilled Therapeutic Interventions:   Patient seen with wife present in room for skilled ST session focusing on cognitive function goals. He required some coaxing initially and cues from wife to turn TV off but his attention did improve overall. When presented with list of current medications, patient initially said, "I cant read that, I cant see well". Wife then gave him his reading glasses and he was able to read text without difficulty. He completed Daily Math Problems subtest from [facility] and received a score of 50%. Most of his errors were from careless mistakes in calculations but also patient exhibiting poor attention, would at times leave out or add words into sentences and wonder why they did not make sense. Patient left in bed with wife in room and all needs within reach. He continues to benefit from skilled SLP intervention to maximize cognitive function prior to discharge.  Pain Pain Assessment Pain Scale: 0-10 Pain Score: 0-No pain  Therapy/Group: Individual Therapy  Sonia Baller, MA, CCC-SLP Speech Therapy

## 2021-04-08 NOTE — Progress Notes (Signed)
Physical Therapy Session Note  Patient Details  Name: Travis Conrad MRN: [id number] Date of Birth: 1951-04-01  Today's Date: 04/08/2021 PT Individual Time: 0800-0900; 1311-1336 PT Individual Time Calculation (min): 60 min and 26 min  Short Term Goals: Week 1:  PT Short Term Goal 1 (Week 1): STG = LTG due to ELOS  Skilled Therapeutic Interventions/Progress Updates:  Session 1 Pt received supine in bed, reported 7/10 pain in R knee and had not received pain meds. Pt requested to be fed breakfast as he is "unable to feed himself". Nursing and SLP notified. Noted pt has resting tremor, not intention tremor. Nursing and therapist encouraged pt to feed himself and pt demonstrated he is able to w/set-up assist. Pt ate very little but refused to eat more. Supine <>sit EOB w/ CGA and pt performed lower body dressing w/ max A. Sit <>stand pivot from EOB to WC w/RW and CGA, pt yelling out in pain during entire transfer. Pt self-propelled 150' in [facility] w/S* around unit, navigating obstacles and negotiating turns w/min verbal cues due to impaired eyesight. Pt reported lightheadedness, BP at 101/66 mmHg. Pt encouraged to stay up in [facility] between therapy sessions to improve stamina, pt hesitantly verbalized agreement. Educated pt on performing ankle pumps while seated in WC for edema management and BP regulation. Pt was left seated in WC, all needs in reach.   Session 2 Pt received seated in WC in room, wife present. Pt reported 8/10 pain in R knee and stated he "felt terrible". BP at 101/66 mmHg and SpO2 at 97% on RA. Pt requested to return to bed and performed sit <>stand pivot w/min A and RW for tactile weight shift facilitation, pt hyperverbal about pain throughout transfer. Pt performed sit <>supine w/S* and pt's condom catheter fell off. Lengthy discussion regarding pt not needing catheter and encouraged him to use urinal, wife agreeable. Pt reported urgency to void and educated pt on using urinal in bed, pt  able to complete successfully. Pt was left supine in bed, all needs in reach. Nursing notified of catheter situation and pt's request for pain meds.   Therapy Documentation Precautions:  Precautions Precautions: Fall Precaution Comments: abdominal incision, watch HR and BP Restrictions Weight Bearing Restrictions: No   Therapy/Group: Individual Therapy Cruzita Lederer Nelissa Bolduc, PT, DPT  04/08/2021, 7:41 AM

## 2021-04-08 NOTE — Progress Notes (Signed)
Occupational Therapy Session Note  Patient Details  Name: Travis Conrad MRN: [id number] Date of Birth: 21-Mar-1951  Today's Date: 04/08/2021 OT Individual Time: 1050-1200 OT Individual Time Calculation (min): 70 min    Short Term Goals: Week 1:  OT Short Term Goal 1 (Week 1): Pt will perform consistently perform sit > stands CGA  with LRAD OT Short Term Goal 2 (Week 1): Pt will don pants with MIN A OT Short Term Goal 3 (Week 1): Pt will tolerate sitting EOB/EOM for 6 min in prep for grooming with close (S)  Skilled Therapeutic Interventions/Progress Updates:    Pt received in bed with his wife present.  He wanted watch Price is Right and told him I would turn it on for the second half of the show when he was doing arm exercises.   Pt able to come sit at EOB with CGA.   Pt then began vomiting, used 2 vomit bags. Had pt brush his teeth with set up.   Pt stated his R knee has been very painful. To provide R knee some support applied ACE wrap.  He was able to sit to stand with CGA with RW.   Pt yelled out in pain with WB on R leg, cued pt to shift to L leg. Pt able to tolerate well.  And then he was even able to step over to his w/c with CGA.  He did yell out in pain as he moved to sit due to pressure on R leg. Then had pt practice kicking out R leg and doing wc push ups 4x 2. He tends to be very self limiting and says he cant do things before trying.  Pt had to stand a second time to allow me to adjust his brief.  Pt able to rise to stand with CGA and stood with S using RW.  No c/o pain in standing.  Pt sat in wc to engage in AROM exercises of shoulders with arm circles, punches, and resisted arm resistance using gait belt. Worked on Upright posture.  LE exercises with towel glides on floor (no pain in R knee),  A/arom with leg extensions, hip abd/add, pressing foot agains belt.   Pt has low activity tolerance, but he did participate in the exercises. Pt wanted to go to bed but strongly  encouraged him to stay up in wc for lunch. Pt in wc with all needs met and belt alarm on.   Therapy Documentation Precautions:  Precautions Precautions: Fall Precaution Comments: abdominal incision, watch HR and BP Restrictions Weight Bearing Restrictions: No   Pain:   C/o pain in R knee with movement - applied ACE wrap for support  ADL: ADL Grooming: Setup, Minimal cueing Where Assessed-Grooming: Wheelchair, Sitting at sink Upper Body Bathing: Minimal assistance Where Assessed-Upper Body Bathing: Edge of bed Lower Body Bathing: Moderate assistance Where Assessed-Lower Body Bathing: Edge of bed Upper Body Dressing: Setup Where Assessed-Upper Body Dressing: Edge of bed Lower Body Dressing: Moderate assistance Where Assessed-Lower Body Dressing: Edge of bed Toileting: Unable to assess (foley & ostomy) Where Assessed-Toileting: Other (Comment) (N/A) Toilet Transfer: Minimal assistance Toilet Transfer Method: Ambulating, Stand pivot (RW) Toilet Transfer Equipment: Bedside commode Tub/Shower Transfer: Unable to assess (safety/medical) ADL Comments:  (some ADLs not assessed d/t incision and ostomy)   Therapy/Group: Individual Therapy  [place] 04/08/2021, 10:29 AM

## 2021-04-08 NOTE — IPOC Note (Signed)
Individualized overall Plan of Care [facility]) Patient Details Name: Travis Conrad MRN: [id number] DOB: 1950/08/06  Admitting Diagnosis: Sepsis [facility])  Hospital Problems: Principal Problem:   Sepsis ([place]) Active Problems:   Hyponatremia   Acute blood loss anemia     Functional Problem List: Nursing Medication Management, Safety, Bladder, Bowel, Nutrition, Endurance, Pain  PT Balance, Behavior, Endurance, Motor, Pain, Safety, Skin Integrity  OT Balance, Behavior, Cognition, Endurance, Motor, Pain, Safety, Sensory, Vision, Perception  SLP Cognition, Perception, Safety  TR         Basic ADL's: OT Eating, Grooming, Bathing, Dressing, Toileting     Advanced  ADL's: OT       Transfers: PT Bed Mobility, Bed to Chair, Car, Patent attorney, Agricultural engineer: PT Ambulation, Emergency planning/management officer, Stairs     Additional Impairments: OT    SLP None      TR      Anticipated Outcomes Item Anticipated Outcome  Self Feeding Supervision  Swallowing  N/A   Basic self-care  Media planner Transfers Supervision  Bowel/Bladder  manage bowel w min assist and bladder without assistance  Transfers  S*  Locomotion  S*  Communication  N/A  Cognition  supervision to minA for basic to mild complex cognition (memory, attention, awareness)  Pain  at or below level 4  Safety/Judgment  maintain safety w cues   Therapy Plan: PT Intensity: Minimum of 1-2 x/day ,45 to 90 minutes PT Frequency: 5 out of 7 days PT Duration Estimated Length of Stay: 7-10 days OT Intensity: Minimum of 1-2 x/day, 45 to 90 minutes OT Frequency: 5 out of 7 days OT Duration/Estimated Length of Stay: 7-10 days SLP Intensity: Minumum of 1-2 x/day, 30 to 90 minutes SLP Frequency: 3 to 5 out of 7 days SLP Duration/Estimated Length of Stay: 7-10 days    Team Interventions: Nursing Interventions Bladder Management, Disease Management/Prevention, Medication  Management, Discharge Planning, Pain Management, Bowel Management, Patient/Family Education  PT interventions Ambulation/gait training, Community reintegration, DME/adaptive equipment instruction, Neuromuscular re-education, Psychosocial support, UE/LE Strength taining/ROM, Stair training, Wheelchair propulsion/positioning, UE/LE Coordination activities, Therapeutic Activities, Skin care/wound management, Pain management, Discharge planning, Functional electrical stimulation, Training and development officer, Cognitive remediation/compensation, Disease management/prevention, Functional mobility training, Patient/family education, Splinting/orthotics, Therapeutic Exercise, Visual/perceptual remediation/compensation  OT Interventions Training and development officer, Cognitive remediation/compensation, Community reintegration, Discharge planning, Disease mangement/prevention, DME/adaptive equipment instruction, Functional mobility training, Pain management, Patient/family education, Psychosocial support, Self Care/advanced ADL retraining, Skin care/wound managment, Therapeutic Activities, Therapeutic Exercise, UE/LE Strength taining/ROM, Visual/perceptual remediation/compensation, UE/LE Coordination activities, Wheelchair propulsion/positioning  SLP Interventions Cognitive remediation/compensation, Internal/external aids, Medication managment, Environmental controls, Functional tasks, Patient/family education  TR Interventions    SW/CM Interventions Discharge Planning, Psychosocial Support, Patient/Family Education   Barriers to Discharge MD  Medical stability and colostomy  Nursing Home environment access/layout, Decreased caregiver support, Wound Care 1 level 3 ste w spouse  PT Decreased caregiver support, Home environment access/layout, Wound Care, Behavior    OT      SLP Other (comments) patient compliance with restrictions  SW Insurance for SNF coverage     Team Discharge Planning: Destination: PT-Home  ,OT- Home , SLP-Home Projected Follow-up: PT-Home health PT, OT-  Home health OT, SLP-24 hour supervision/assistance Projected Equipment Needs: PT-To be determined, OT- To be determined, SLP-None recommended by SLP Equipment Details: PT- , OT-  Patient/family involved in discharge planning: PT- Family member/caregiver, Patient,  OT-Patient, Family member/caregiver, SLP-Patient, Family member/caregiver  MD ELOS: 7-10 days.  Medical Rehab Prognosis:  Good Assessment: Travis Conrad. Travis Conrad is a 70 year old right-handed male with history of PAF status post ablation 10/03/2019 followed by Dr. [name] maintained on Eliquis, hyperlipidemia, hypertension, tobacco/alcohol use, benign essential tremors, prior diverticulitis.  Presented 02/20/2021 with increasing abdominal pain nausea and vomiting times several days as well as decrease in appetite and decreased urination.  On presentation to the ED he was mildly hypotensive with systolic blood pressure in the 70s.  Noted WBC of 21,000 as well as AKI with creatinine 3.2, lipase 22, lactic acid 1.3.  CT of abdomen pelvis showed inflammation of the sigmoid colon and a small amount of extraluminal gas, consistent with perforated diverticulitis and abscess.  He was started on Zosyn in the ED as well as IV fluids and low-dose Levophed.  A nasogastric tube for gastric decompression.  Close monitoring of blood pressure as well as history of PAF requiring intravenous amiodarone with echocardiogram completed 02/23/2021 showing ejection fraction of 60 to 65%.  Patient was seen in consultation by interventional radiology 02/24/2021 for aspiration and drain placement of intra-abdominal fluid collection per Dr.Jon Mugweru and later underwent exploratory laparotomy, sigmoid colectomy with end colostomy creation drainage of intra-abdominal abscess with wound VAC placement 02/26/2021 per surgery Dr.  Kieth Brightly.  Hospital course complicated by bouts of delirium developed postoperative anemia  required 1 unit packed red blood cells with latest hemoglobin of 9.  Patient was cleared to resume Eliquis as prior to admission for PAF.  Urology consulted 03/02/2021 due to right scrotal pain with scrotal ultrasound demonstrating findings consistent with right epididymitis and a reactive complex hydrocele.  Developed VRE UTI placed on Zyvox through 04/04/2021 and maintained on contact precautions.   Developed left knee pain findings of positive effusion underwent left knee aspiration  injection with good toleration, fluid showed no crystals, gram stain with no organisms.  In regards to patient's AKI initially maintained on TPN renal function much improved and TPN DC'd.  Patient is currently tolerating a mechanical soft diet.  Patient with resulting functional deficits with mobility, endurance.  We will set goals for supervision with PT/OT/SLP.  Due to the current state of emergency, patients may not be receiving their 3-hours of Medicare-mandated therapy.  See Team Conference Notes for weekly updates to the plan of care

## 2021-04-08 NOTE — Progress Notes (Signed)
Inpatient Rehabilitation Admission Medication Review by a Pharmacist - Update  A complete drug regimen review was completed for this patient to identify any potential clinically significant medication issues.  High Risk Drug Classes Is patient taking? Indication by Medication  Antipsychotic No   Anticoagulant Yes Apixaban: VTE ppx  Antibiotic No   Opioid Yes Oxycodone: PRN pain control  Antiplatelet No   Hypoglycemics/insulin Yes Insulin aspart: hyperglycemic control Insulin regular: TPN - hyperglycemic control  Vasoactive Medication Yes Amiodarone: Afib control Diltiazem: Afib control  Chemotherapy No   Other Yes Acetaminophen: pain control Dronabinol: appetite stimulant Folic acid: nutrition/supplementation Furosemide: HF, edema treatment Methocarbamol: muscle spasms Pantoprazole: GERD ppx Thiamine: nutrition/supplementation TPN: nutrition Zolpidem: PRN insomnia     Type of Medication Issue Identified Description of Issue Recommendation(s)  Drug Interaction(s) (clinically significant)     Duplicate Therapy     Allergy     No Medication Administration End Date     Incorrect Dose     Additional Drug Therapy Needed     Significant med changes from prior encounter (inform family/care partners about these prior to discharge).    Other       Clinically significant medication issues were identified that warrant physician communication and completion of prescribed/recommended actions by midnight of the next day:  No  Pharmacist comments:  - Sotalol, rosuvastatin, losartan, HCTZ, and Linzess had been held during prior admission and were not addressed at current discharge to CIR, can consider resuming during CIR admission if appropriate  Time spent performing this drug regimen review (minutes):  20 minutes   Thank you for allowing pharmacy to be a part of this patient's care.  Ardyth Harps, PharmD Clinical Pharmacist

## 2021-04-09 LAB — GLUCOSE, CAPILLARY
Glucose-Capillary: 125 mg/dL — ABNORMAL HIGH (ref 70–99)
Glucose-Capillary: 101 mg/dL — ABNORMAL HIGH (ref 70–99)
Glucose-Capillary: 130 mg/dL — ABNORMAL HIGH (ref 70–99)

## 2021-04-09 NOTE — Progress Notes (Signed)
Speech Language Pathology Daily Session Note  Patient Details  Name: Travis Conrad MRN: [id number] Date of Birth: 05-13-1951  Today's Date: 04/09/2021 SLP Individual Time: 1430-1530 SLP Individual Time Calculation (min): 60 min  Short Term Goals: Week 1: SLP Short Term Goal 1 (Week 1): Patient will use visual aides to orient to time with minA cues to initiate use. SLP Short Term Goal 2 (Week 1): Patient will sustain attention to basic to mildly complex tasks with modA verbal and visual cues. SLP Short Term Goal 3 (Week 1): Patient will demonstrate adequate safety awareness during ADL's and simulated functional tasks with min-modA verbal and visual cues. SLP Short Term Goal 4 (Week 1): Patient will recall specific information (strategies, exercises, precautions, etc) from PT, OT, ST with modA verbal cues.  Skilled Therapeutic Interventions:  Pt was seen for skilled ST targeting cognitive goals.  Upon arrival, pt was reclined in bed, awake, "feeling crappy,"  but willing to participate in treatment.   Pt was irritable due to pain in his knee but was redirectable when engaged in personally meaningful conversations.  Pt was oriented to month, situation, and place independently but needed min cues to reorient to exact date (day of the week, date, and year).  Throughout the session, pt would ask therapist to do things for him that he could easily do for himself (ie take the paper wrapper off the tip of his straw, adjust his pillows behind his head or blankets) but was receptive when gently encouraged to do things for himself.  Pt was left in bed with bed alarm set and call bell within reach.   Continue per current plan of care.    Pain Pain Assessment Pain Scale: 0-10 Pain Score: 9  Pain Type: Acute pain Pain Location: Knee Pain Orientation: Right Pain Descriptors / Indicators: Aching Pain Frequency: Intermittent Pain Onset: Gradual Patients Stated Pain Goal: 0 Pain Intervention(s): RN  made aware   Therapy/Group: Individual Therapy  Liannah Yarbough, Selinda Orion 04/09/2021, 3:52 PM

## 2021-04-09 NOTE — Progress Notes (Signed)
Pt continues to complain of right side knee pain. PRN meds constantly being given. Heat packs placed on knee with some relief.

## 2021-04-09 NOTE — Progress Notes (Signed)
Occupational Therapy Session Note  Patient Details  Name: Travis Conrad MRN: [id number] Date of Birth: [birth year]  Today's Date: 04/09/2021 OT Individual Time: 8588-5027 OT Individual Time Calculation (min): 58 min    Short Term Goals: Week 1:  OT Short Term Goal 1 (Week 1): Pt will perform consistently perform sit > stands CGA  with LRAD OT Short Term Goal 2 (Week 1): Pt will don pants with MIN A OT Short Term Goal 3 (Week 1): Pt will tolerate sitting EOB/EOM for 6 min in prep for grooming with close (S)  Skilled Therapeutic Interventions/Progress Updates:    Pt received semi-reclined in bed with nursing present, c/o ongoing R knee pain, RN applying lidocaine patch, agreeable to OOB therapy with encouragement. Session focus on self-care retraining, activity tolerance, BLE strengthening, functional transfers in prep for improved ADL/IADL/func mobility performance + decreased caregiver burden. Came to sitting EOB with CGA and increased time due to R knee pain, pt yelling out with mobility. Squat-pivot bed<> w/c with min A due to inability to tolerate WB on [name]. Sit to stand at sink with min A and total A to pull up shorts. Completed seated grooming at sink with distant S and cues to locate desired items on sink. Donned B teds and socks with total A.   Pt completed 1x20 B ankle pumps, and L seated marches and knee extensions to target BLE strengthening, decrease BLE edema, and improved OOB activity tolerance. Trialled elevated leg rest for [name], but pt reports no improvement in pain.   Pt initially agreeable to sit up in w/c until next therapy, but then last minute requesting to get back into bed "I can't sit here for an hour." Returned to supine with min A to manage [name].   Pt left with HOB at 30 degrees with bed alarm engaged, call bell in reach, and all immediate needs met. Required assist to locate urinal on his L and to problem solve LB clothing management to use urinal in  bed.   Therapy Documentation Precautions:  Precautions Precautions: Fall Precaution Comments: abdominal incision, watch HR and BP Restrictions Weight Bearing Restrictions: No  Pain: Pain Assessment Pain Scale: 0-10 Pain Score: 7  Pain Type: Acute pain Pain Location: Knee Pain Orientation: Right Pain Descriptors / Indicators: Aching;Throbbing Pain Frequency: Intermittent Pain Onset: Gradual Patients Stated Pain Goal: 0 Pain Intervention(s): Medication (See eMAR) ADL: See Care Tool for more details.   Therapy/Group: Individual Therapy  Volanda Napoleon MS, OTR/L  04/09/2021, 6:53 AM

## 2021-04-09 NOTE — Progress Notes (Signed)
PROGRESS NOTE   Subjective/Complaints: Right knee painful- has voltaren gel ordered- continue Sleepy today  ROS: Denies CP, SOB, N/V/D, fevers, chills, abdominal discomfort. +right knee pain  Objective:   No results found. Recent Labs    04/07/21 0615 04/08/21 0510  WBC 11.3* 10.4  HGB 9.0* 8.9*  HCT 29.0* 28.7*  PLT 386 378   Recent Labs    04/07/21 0615  NA 132*  K 4.2  CL 96*  CO2 28  GLUCOSE 142*  BUN 24*  CREATININE 0.81  CALCIUM 8.6*    Intake/Output Summary (Last 24 hours) at 04/09/2021 2001 Last data filed at 04/09/2021 1939 Gross per 24 hour  Intake 300 ml  Output 1100 ml  Net -800 ml        Physical Exam: Vital Signs Blood pressure 110/65, pulse 89, temperature 97.9 F (36.6 C), resp. rate 18, height 6' (1.829 m), weight 88.4 kg, SpO2 98 %. Gen: no distress, normal appearing HEENT: oral mucosa pink and moist, NCAT Cardio: Reg rate Chest: normal effort, normal rate of breathing GI: Non-distended.  BS +.  + Colostomy. Skin: Warm and dry.  Intact. Psych: Normal mood.  Normal behavior. Musc: No edema in extremities.  No tenderness in extremities. Neuro: Alert Makes eye contact with examiner.   Follows commands.   Motor:  [name]: HF, KE 2+/5 (pain inhibition) [name]: 3/5 proximal to distal  Assessment/Plan: 1. Functional deficits which require 3+ hours per day of interdisciplinary therapy in a comprehensive inpatient rehab setting. Physiatrist is providing close team supervision and 24 hour management of active medical problems listed below. Physiatrist and rehab team continue to assess barriers to discharge/monitor patient progress toward functional and medical goals  Care Tool:  Bathing    Body parts bathed by patient: Left upper leg, Right upper leg, Front perineal area, Left arm, Right arm, Face, Left lower leg, Right lower leg     Body parts n/a: Chest, Abdomen, Buttocks   Bathing  assist Assist Level: Minimal Assistance - Patient > 75%     Upper Body Dressing/Undressing Upper body dressing   What is the patient wearing?: Pull over shirt    Upper body assist Assist Level: Contact Guard/Touching assist    Lower Body Dressing/Undressing Lower body dressing      What is the patient wearing?: Pants     Lower body assist Assist for lower body dressing: Maximal Assistance - Patient 25 - 49%     Toileting Toileting Toileting Activity did not occur (Clothing management and hygiene only): N/A (no void or bm) (foley and ostomy)  Toileting assist Assist for toileting: Dependent - Patient 0% (pt uses a urinal with help.)     Transfers Chair/bed transfer  Transfers assist  Chair/bed transfer activity did not occur: Safety/medical concerns (Fatigue, agitation, pain)  Chair/bed transfer assist level: Minimal Assistance - Patient > 75%     Locomotion Ambulation   Ambulation assist      Assist level: Contact Guard/Touching assist Assistive device: Walker-rolling Max distance: 10'   Walk 10 feet activity   Assist     Assist level: Contact Guard/Touching assist Assistive device: Walker-rolling   Walk 50 feet activity  Assist Walk 50 feet with 2 turns activity did not occur: Safety/medical concerns (Fatigue, agitation, pain)         Walk 150 feet activity   Assist Walk 150 feet activity did not occur: Safety/medical concerns (Fatigue, agitation, pain)         Walk 10 feet on uneven surface  activity   Assist Walk 10 feet on uneven surfaces activity did not occur: Safety/medical concerns (Fatigue, agitation, pain)         Wheelchair     Assist Is the patient using a wheelchair?: Yes Type of Wheelchair: Manual    Wheelchair assist level: Supervision/Verbal cueing Max wheelchair distance: 150    Wheelchair 50 feet with 2 turns activity    Assist        Assist Level: Supervision/Verbal cueing   Wheelchair 150  feet activity     Assist      Assist Level: Supervision/Verbal cueing   Blood pressure 110/65, pulse 89, temperature 97.9 F (36.6 C), resp. rate 18, height 6' (1.829 m), weight 88.4 kg, SpO2 98 %.   Medical Problem List and Plan: 1.  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  Antithrombotics: -DVT/anticoagulation:  Pharmaceutical: Other (comment) Eliquis             -antiplatelet therapy: N/A 3. Pain: continue Lidoderm patch, Robaxin 1000 mg 4 times daily, oxycodone as needed 4. Mood: Ativan 1 mg every 6 hours as needed             -antipsychotic agents: N/A 5. Neuropsych: This patient is capable of making decisions on his own behalf. 6. Skin/Wound Care: Routine skin checks/colostomy care 7. Fluids/Electrolytes/Nutrition: Routine in and outs 8.  Postoperative anemia.     Hemoglobin 8.9 on 10/21 9.  Paroxysmal atrial fibrillation.  Continue Amiodarone 200 mg daily, Cardizem 60 mg every 6 hours.  Follow-up cardiology services  Monitor with increased exertion 10.  VRE UTI.  Completed course of Zyvox 04/04/2021.  Contact precautions. 11.  AKI.  Resolved.   12.  Tremors.  Patient was on sotalol at home and currently on hold 13.  Hypertension.   Continue Lasix to 10 mg daily given frequent urination.   14.  Right epididymitis.  Resolved.  Completed course of doxycycline.  Follow-up urology services as needed 15.  Left knee arthritis/effusion.  Responded well to steroid injection. 16.  History of alcohol tobacco abuse.  NicoDerm patch.  Provide counseling 17.  Anasarca.  Decreased nutritional storage.  Marinol added for appetite stimulant 18.  Tobacco abuse.  NicoDerm patch.  Counseling 19. Insomnia: start Rozarem HS. Resolved. 20. Suboptimal magnesium levels: d/c colace and started magnesium gluconate  250mg  HS.  21. Vitamin D deficiency: level 14 on 10/20. Ergocalciferol 50,000U once per week for 7 weeks ordered. 22. Inadequate oral intake: resolved. D/ced TPN. 23.  Hyponatremia  Sodium 132 on 10/20, labs ordered for Monday   LOS: 3 days A FACE TO FACE EVALUATION WAS PERFORMED  Travis Conrad 04/09/2021, 8:01 PM

## 2021-04-09 NOTE — Progress Notes (Signed)
Physical Therapy Session Note  Patient Details  Name: Travis Conrad MRN: [id number] Date of Birth: 1950-08-21  Today's Date: 04/09/2021 PT Missed Time: 16 Minutes Missed Time Reason: Patient unwilling to participate;Patient fatigue  Short Term Goals: Week 1:  PT Short Term Goal 1 (Week 1): STG = LTG due to ELOS  Skilled Therapeutic Interventions/Progress Updates:    Patient asleep in bed, very difficult to wake. Upon slightly waking up, patient frustrated that PT was in his room. He reports having just transferred back into bed and fallen asleep. RN reporting that patient has had trouble sleeping and would benefit from sleeping. Bed alarm on, call light within reach.   Therapy Documentation Precautions:  Precautions Precautions: Fall Precaution Comments: abdominal incision, watch HR and BP Restrictions Weight Bearing Restrictions: No    Therapy/Group: Individual Therapy  Karoline Caldwell, PT, DPT, CBIS  04/09/2021, 7:36 AM

## 2021-04-10 LAB — GLUCOSE, CAPILLARY
Glucose-Capillary: 121 mg/dL — ABNORMAL HIGH (ref 70–99)
Glucose-Capillary: 123 mg/dL — ABNORMAL HIGH (ref 70–99)
Glucose-Capillary: 116 mg/dL — ABNORMAL HIGH (ref 70–99)

## 2021-04-11 ENCOUNTER — Inpatient Hospital Stay (HOSPITAL_COMMUNITY): Payer: Medicare Other

## 2021-04-11 DIAGNOSIS — R5381 Other malaise: Secondary | ICD-10-CM | POA: Diagnosis not present

## 2021-04-11 DIAGNOSIS — R6520 Severe sepsis without septic shock: Secondary | ICD-10-CM | POA: Diagnosis not present

## 2021-04-11 DIAGNOSIS — D72829 Elevated white blood cell count, unspecified: Secondary | ICD-10-CM

## 2021-04-11 DIAGNOSIS — A419 Sepsis, unspecified organism: Secondary | ICD-10-CM | POA: Diagnosis not present

## 2021-04-11 LAB — URINALYSIS, ROUTINE W REFLEX MICROSCOPIC
Bilirubin Urine: NEGATIVE
Glucose, UA: NEGATIVE mg/dL
Ketones, ur: NEGATIVE mg/dL
Leukocytes,Ua: NEGATIVE
Nitrite: NEGATIVE
Protein, ur: NEGATIVE mg/dL
Specific Gravity, Urine: 1.016 (ref 1.005–1.030)
pH: 5 (ref 5.0–8.0)

## 2021-04-11 LAB — CBC
HCT: 31.1 % — ABNORMAL LOW (ref 39.0–52.0)
Hemoglobin: 9.6 g/dL — ABNORMAL LOW (ref 13.0–17.0)
MCH: 28.1 pg (ref 26.0–34.0)
MCHC: 30.9 g/dL (ref 30.0–36.0)
MCV: 90.9 fL (ref 80.0–100.0)
Platelets: 502 10*3/uL — ABNORMAL HIGH (ref 150–400)
RBC: 3.42 MIL/uL — ABNORMAL LOW (ref 4.22–5.81)
RDW: 15.5 % (ref 11.5–15.5)
WBC: 12.5 10*3/uL — ABNORMAL HIGH (ref 4.0–10.5)
nRBC: 0 % (ref 0.0–0.2)

## 2021-04-11 LAB — BASIC METABOLIC PANEL
Anion gap: 9 (ref 5–15)
BUN: 19 mg/dL (ref 8–23)
CO2: 28 mmol/L (ref 22–32)
Calcium: 9 mg/dL (ref 8.9–10.3)
Chloride: 93 mmol/L — ABNORMAL LOW (ref 98–111)
Creatinine, Ser: 0.89 mg/dL (ref 0.61–1.24)
GFR, Estimated: >60 mL/min (ref >60)
Glucose, Bld: 119 mg/dL — ABNORMAL HIGH (ref 70–99)
Potassium: 3.9 mmol/L (ref 3.5–5.1)
Sodium: 130 mmol/L — ABNORMAL LOW (ref 135–145)

## 2021-04-11 LAB — GLUCOSE, CAPILLARY
Glucose-Capillary: 133 mg/dL — ABNORMAL HIGH (ref 70–99)
Glucose-Capillary: 109 mg/dL — ABNORMAL HIGH (ref 70–99)
Glucose-Capillary: 104 mg/dL — ABNORMAL HIGH (ref 70–99)
Glucose-Capillary: 106 mg/dL — ABNORMAL HIGH (ref 70–99)

## 2021-04-11 NOTE — Progress Notes (Signed)
Occupational Therapy Session Note  Patient Details  Name: Travis Conrad MRN: [id number] Date of Birth: 05/25/1951  Today's Date: 04/11/2021 OT Individual Time: 4431-5400 OT Individual Time Calculation (min): 60 min    Short Term Goals: Week 1:  OT Short Term Goal 1 (Week 1): Pt will perform consistently perform sit > stands CGA  with LRAD OT Short Term Goal 2 (Week 1): Pt will don pants with MIN A OT Short Term Goal 3 (Week 1): Pt will tolerate sitting EOB/EOM for 6 min in prep for grooming with close (S)  Skilled Therapeutic Interventions/Progress Updates:    Pt received in bed and much more conversational and engaged than when I saw him on Friday. Pt agreeable to getting out of bed, bathing and dressing. He said his R knee pain continues to be "intense". Did notice his R knee is swollen compared to his L. (Informed case IT consultant). Encouraged pt to move slowly and try to put most of his weight on his L leg with sit to stands and standing tolerance. Pt able to sit to EOB with S and then engaged in UB self care EOB with min cues.  Worked on sit to stands several times with CGA to RW to doff wet briefs, use urinal, wash bottom (with A), don new briefs and pull up pants with A. Pt stated he could stand but he did not feel ready to let go with one hand to help wash bottom or pull up pants.  He did yell out in pain every time he stood and could only tolerate standing for less than 45 seconds.  He did wash most of his LB from sitting EOB and was able to get pants over feet with mod A.   Pt stated, "if my knee did not hurt, I think I could do everything myself". The knee pain is a major limiting factor.   Pt was able to stand pivot to wc with with RW with CGA.  Sat at sink to brush teeth, use shampoo cap, washed face. Pt resting in wc with all needs met and belt alarm on.  Call light in reach.  Informed his RN his colostomy bag was starting to get full.     Therapy  Documentation Precautions:  Precautions Precautions: Fall Precaution Comments: abdominal incision, watch HR and BP Restrictions Weight Bearing Restrictions: No   Pain: Pain Assessment Pain Score: 8  Pain Type: Acute pain Pain Location: Knee Pain Orientation: Right Pain Descriptors / Indicators: Aching Pain Onset: With Activity Pain Intervention(s): Rest ADL: ADL Grooming: Setup, Minimal cueing Where Assessed-Grooming: Wheelchair, Sitting at sink Upper Body Bathing: Setup, Minimal cueing Where Assessed-Upper Body Bathing: Edge of bed Lower Body Bathing: Minimal assistance Where Assessed-Lower Body Bathing: Edge of bed Upper Body Dressing: Setup Where Assessed-Upper Body Dressing: Edge of bed Lower Body Dressing: Moderate assistance Where Assessed-Lower Body Dressing: Edge of bed Toileting: Unable to assess (foley & ostomy) Where Assessed-Toileting: Other (Comment) (N/A) Toilet Transfer: Minimal assistance Toilet Transfer Method: Ambulating, Stand pivot (RW) Toilet Transfer Equipment: Bedside commode Tub/Shower Transfer: Unable to assess (safety/medical) ADL Comments:  (some ADLs not assessed d/t incision and ostomy)  Therapy/Group: Individual Therapy  [place] 04/11/2021, 9:20 AM

## 2021-04-11 NOTE — Progress Notes (Signed)
PROGRESS NOTE   Subjective/Complaints:  Pt reports R knee painful- using Kpad on it- somewhat helpful.   Colostomy working OK.   Bladder working OK.   ROS:  Pt denies SOB, abd pain, CP, N/V/C/D, and vision changes C/o R knee pain   Objective:   No results found. Recent Labs    04/11/21 0306  WBC 12.5*  HGB 9.6*  HCT 31.1*  PLT 502*   Recent Labs    04/11/21 1208  NA 130*  K 3.9  CL 93*  CO2 28  GLUCOSE 119*  BUN 19  CREATININE 0.89  CALCIUM 9.0    Intake/Output Summary (Last 24 hours) at 04/11/2021 1807 Last data filed at 04/11/2021 1335 Gross per 24 hour  Intake 120 ml  Output 350 ml  Net -230 ml        Physical Exam: Vital Signs Blood pressure 109/64, pulse 85, temperature (!) 97.3 F (36.3 C), resp. rate 16, height 6' (1.829 m), weight 89 kg, SpO2 99 %.    General: awake, alert, appropriate, laying supine in bed; NAD HENT: conjugate gaze; oropharynx moist CV: regular rate; no JVD Pulmonary: CTA B/L; no W/R/R- good air movement GI: soft, NT, ND, (+)BS- colostomy seen in place- mainly has air in pouch Psychiatric: appropriate; flat Neurological: alert  Skin: Warm and dry.  Intact.- didn't assess abd wound Musc: No edema in extremities.  No tenderness in extremities. Neuro: Alert Makes eye contact with examiner.   Follows commands.   Motor:  [name]: HF, KE 2+/5 (pain inhibition) [name]: 3/5 proximal to distal  Assessment/Plan: 1. Functional deficits which require 3+ hours per day of interdisciplinary therapy in a comprehensive inpatient rehab setting. Physiatrist is providing close team supervision and 24 hour management of active medical problems listed below. Physiatrist and rehab team continue to assess barriers to discharge/monitor patient progress toward functional and medical goals  Care Tool:  Bathing    Body parts bathed by patient: Left upper leg, Right upper leg, Front  perineal area, Left arm, Right arm, Face, Chest   Body parts bathed by helper: Right lower leg, Left lower leg Body parts n/a: Abdomen   Bathing assist Assist Level: Minimal Assistance - Patient > 75%     Upper Body Dressing/Undressing Upper body dressing   What is the patient wearing?: Pull over shirt    Upper body assist Assist Level: Set up assist    Lower Body Dressing/Undressing Lower body dressing      What is the patient wearing?: Pants, Incontinence brief     Lower body assist Assist for lower body dressing: Moderate Assistance - Patient 50 - 74%     Toileting Toileting Toileting Activity did not occur Landscape architect and hygiene only): N/A (no void or bm) (foley and ostomy)  Toileting assist Assist for toileting: Dependent - Patient 0% (pt uses a urinal with help.)     Transfers Chair/bed transfer  Transfers assist  Chair/bed transfer activity did not occur: Safety/medical concerns (Fatigue, agitation, pain)  Chair/bed transfer assist level: Minimal Assistance - Patient > 75%     Locomotion Ambulation   Ambulation assist      Assist level: Contact Guard/Touching assist  Assistive device: Walker-rolling Max distance: 10'   Walk 10 feet activity   Assist     Assist level: Contact Guard/Touching assist Assistive device: Walker-rolling   Walk 50 feet activity   Assist Walk 50 feet with 2 turns activity did not occur: Safety/medical concerns (Fatigue, agitation, pain)         Walk 150 feet activity   Assist Walk 150 feet activity did not occur: Safety/medical concerns (Fatigue, agitation, pain)         Walk 10 feet on uneven surface  activity   Assist Walk 10 feet on uneven surfaces activity did not occur: Safety/medical concerns (Fatigue, agitation, pain)         Wheelchair     Assist Is the patient using a wheelchair?: Yes Type of Wheelchair: Manual    Wheelchair assist level: Supervision/Verbal cueing Max  wheelchair distance: 150    Wheelchair 50 feet with 2 turns activity    Assist        Assist Level: Supervision/Verbal cueing   Wheelchair 150 feet activity     Assist      Assist Level: Supervision/Verbal cueing   Blood pressure 109/64, pulse 85, temperature (!) 97.3 F (36.3 C), resp. rate 16, height 6' (1.829 m), weight 89 kg, SpO2 99 %.   Medical Problem List and Plan: 1.  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  Antithrombotics: -DVT/anticoagulation:  Pharmaceutical: Other (comment) Eliquis             -antiplatelet therapy: N/A 3. Pain: continue Lidoderm patch, Robaxin 1000 mg 4 times daily, oxycodone as needed  10/24- using kpad for R knee pain- if cannot find source of leukocytosis, might need to get imaging of R knee? 4. Mood: Ativan 1 mg every 6 hours as needed             -antipsychotic agents: N/A 5. Neuropsych: This patient is capable of making decisions on his own behalf. 6. Skin/Wound Care: Routine skin checks/colostomy care 7. Fluids/Electrolytes/Nutrition: Routine in and outs 8.  Postoperative anemia.     Hemoglobin 8.9 on 10/21 9.  Paroxysmal atrial fibrillation.  Continue Amiodarone 200 mg daily, Cardizem 60 mg every 6 hours.  Follow-up cardiology services  Monitor with increased exertion 10.  VRE UTI.  Completed course of Zyvox 04/04/2021.  Contact precautions. 11.  AKI.  Resolved.   12.  Tremors.  Patient was on sotalol at home and currently on hold 13.  Hypertension.   Continue Lasix to 10 mg daily given frequent urination.   14.  Right epididymitis.  Resolved.  Completed course of doxycycline.  Follow-up urology services as needed 15.  Left knee arthritis/effusion.  Responded well to steroid injection. 16.  History of alcohol tobacco abuse.  NicoDerm  patch.  Provide counseling 17.  Anasarca.  Decreased nutritional storage.  Marinol added for appetite stimulant 18.  Tobacco abuse.  NicoDerm patch.  Counseling 19. Insomnia: start Rozarem HS. Resolved. 20. Suboptimal magnesium levels: d/c colace and started magnesium gluconate 250mg  HS.  21. Vitamin D deficiency: level 14 on 10/20. Ergocalciferol 50,000U once per week for 7 weeks ordered. 22. Inadequate oral intake: resolved. D/ced TPN. 23.  Hyponatremia  Sodium 132 on 10/20, labs ordered for Monday  24. R knee pain-   10/24- if cannot find source, of increased WBC, might need R knee imaging 25. New  leukocytosis  10/24- will check U/A and Cx and CXR- and recheck labs in AM- is afebrile-   LOS: 5 days A FACE TO FACE EVALUATION WAS PERFORMED  Alyna Stensland 04/11/2021, 6:07 PM

## 2021-04-11 NOTE — Progress Notes (Signed)
Physical Therapy Session Note  Patient Details  Name: Travis Conrad MRN: [id number] Date of Birth: 06-Jun-1951  Today's Date: 04/11/2021 PT Individual Time: 1000-1119 PT Individual Time Calculation (min): 79 min   Short Term Goals: Week 1:  PT Short Term Goal 1 (Week 1): STG = LTG due to ELOS  Skilled Therapeutic Interventions/Progress Updates:  Pt received seated in WC in room, reported 8/10 pain in R knee and repeatedly told therapist he "would not do anything" due to pain. After significant encouragement, pt agreeable to try light exercise. Nursing notified to administer more pain meds. Noted significant hamstring tightness bilaterally and therapist performed hamstring stretch to [name] for 90s, as long as pt could tolerate. Educated pt on importance of muscular mobility and impact muscular tightness has on pain, pt verbalized understanding.   Pt transported to day room w/total A for time management and attempted sit <>stand x5 from [facility], only successful on last attempt. Pt reported he "refused to stand" after 4th attempt, threapist provided extensive encouragement to try a 5th stand. Max verbal cues for sequencing, anterior weight shift and hand placement. Pt became very agitated, yelling at therapist saying he "told her he would not try again". Allowed several minutes of silence for pt to calm himself down and on 5th attempt, pt performed sit <>stand w/min A. Stand pivot to NuStep w/min A, min tactile cues for weight shift and hip guidance. Pt required mod A to place BLEs in pedals and performed 10 minutes on NuStep level 4 for improved BLE ROM, endurance, activity tolerance and global strength with several rest breaks. Pt hyperverbal throughout activity, cursing at therapist and yelling out in pain. Mod A to remove BLEs from pedals and pt performed sit <>stand pivot from NuStep to [facility] w/min A and RW. Pt transported back to room w/total A due to pt agitation and fatigue. Pt performed sit <> stand  pivot from WC to EOB w/min A, mod verbal cues for sequencing and min tactile cues for weightshift. Pt performed sit <>supine w/CGA but immediately began ordering therapist to "move him in the bed" and "move his legs for him". Informed pt to move his legs at much as possible himself, pt became very agitated and began cursing at therapist. Pt required max A to scoot to [facility] due to pt refusal to help. Pt was left supine in bed, all needs in reach.   Therapy Documentation Precautions:  Precautions Precautions: Fall Precaution Comments: abdominal incision, watch HR and BP Restrictions Weight Bearing Restrictions: No   Therapy/Group: Individual Therapy Cruzita Lederer Felice Deem, PT, DPT  04/11/2021, 7:48 AM

## 2021-04-11 NOTE — Progress Notes (Signed)
Speech Language Pathology Daily Session Note  Patient Details  Name: Travis Conrad MRN: [id number] Date of Birth: [birth year]  Today's Date: 04/11/2021 SLP Individual Time: 1401-1500 SLP Individual Time Calculation (min): 59 min  Short Term Goals: Week 1: SLP Short Term Goal 1 (Week 1): Patient will use visual aides to orient to time with minA cues to initiate use. SLP Short Term Goal 2 (Week 1): Patient will sustain attention to basic to mildly complex tasks with modA verbal and visual cues. SLP Short Term Goal 3 (Week 1): Patient will demonstrate adequate safety awareness during ADL's and simulated functional tasks with min-modA verbal and visual cues. SLP Short Term Goal 4 (Week 1): Patient will recall specific information (strategies, exercises, precautions, etc) from PT, OT, ST with modA verbal cues.  Skilled Therapeutic Interventions: Pt seen for skilled ST with focus on cognitive goals, pt wife present throughout. Pt c/o severe pain in both legs which has "crept up" into lower back. During rapport building conversation, pt with occasional inaccuracies requiring wife assistance to correct (stated he lived in [name], that they had been married 64 years vs 22 years). Pt and wife continue to endorse cognitive changes during prolonged hospital course. Pt completing portions of ALFA: "Counting Money" - 60%, "Using a calendar" - 80% and "Solving Daily Math Problems" - 80% (pt previously 50% accuracy on solving daily math problems portion).Portions of testing marked incorrect were primarily due to patient not wanting to complete them vs inaccurate responses. Pt with increase sustained attention to task provided min A cues and encouragement. Spoke with RN about pt request for pain medication, pt left in bed with alarm set and wife present for needs. Cont ST POC.  Pain Pain Assessment Pain Scale: 0-10 Pain Score: 6  Pain Type: Acute pain Pain Location: Back  Therapy/Group:  Individual Therapy  [name] 04/11/2021, 3:01 PM

## 2021-04-12 DIAGNOSIS — R5381 Other malaise: Secondary | ICD-10-CM | POA: Diagnosis not present

## 2021-04-12 DIAGNOSIS — D72829 Elevated white blood cell count, unspecified: Secondary | ICD-10-CM | POA: Diagnosis not present

## 2021-04-12 DIAGNOSIS — A419 Sepsis, unspecified organism: Secondary | ICD-10-CM | POA: Diagnosis not present

## 2021-04-12 DIAGNOSIS — R6520 Severe sepsis without septic shock: Secondary | ICD-10-CM | POA: Diagnosis not present

## 2021-04-12 LAB — CBC WITH DIFFERENTIAL/PLATELET
Abs Immature Granulocytes: 0.03 10*3/uL (ref 0.00–0.07)
Basophils Absolute: 0 10*3/uL (ref 0.0–0.1)
Basophils Relative: 0 %
Eosinophils Absolute: 0.3 10*3/uL (ref 0.0–0.5)
Eosinophils Relative: 3 %
HCT: 28.7 % — ABNORMAL LOW (ref 39.0–52.0)
Hemoglobin: 9.1 g/dL — ABNORMAL LOW (ref 13.0–17.0)
Immature Granulocytes: 0 %
Lymphocytes Relative: 27 %
Lymphs Abs: 2.7 10*3/uL (ref 0.7–4.0)
MCH: 28.4 pg (ref 26.0–34.0)
MCHC: 31.7 g/dL (ref 30.0–36.0)
MCV: 89.7 fL (ref 80.0–100.0)
Monocytes Absolute: 1.5 10*3/uL — ABNORMAL HIGH (ref 0.1–1.0)
Monocytes Relative: 14 %
Neutro Abs: 5.6 10*3/uL (ref 1.7–7.7)
Neutrophils Relative %: 56 %
Platelets: 500 10*3/uL — ABNORMAL HIGH (ref 150–400)
RBC: 3.2 MIL/uL — ABNORMAL LOW (ref 4.22–5.81)
RDW: 15.5 % (ref 11.5–15.5)
WBC: 10.1 10*3/uL (ref 4.0–10.5)
nRBC: 0 % (ref 0.0–0.2)

## 2021-04-12 LAB — GLUCOSE, CAPILLARY
Glucose-Capillary: 112 mg/dL — ABNORMAL HIGH (ref 70–99)
Glucose-Capillary: 100 mg/dL — ABNORMAL HIGH (ref 70–99)
Glucose-Capillary: 92 mg/dL (ref 70–99)
Glucose-Capillary: 90 mg/dL (ref 70–99)

## 2021-04-12 NOTE — Progress Notes (Signed)
Speech Language Pathology Daily Session Note  Patient Details  Name: Travis Conrad MRN: [id number] Date of Birth: 12-Sep-1950  Today's Date: 04/12/2021 SLP Individual Time: 1431-1515 SLP Individual Time Calculation (min): 44 min  Short Term Goals: Week 1: SLP Short Term Goal 1 (Week 1): Patient will use visual aides to orient to time with minA cues to initiate use. SLP Short Term Goal 2 (Week 1): Patient will sustain attention to basic to mildly complex tasks with modA verbal and visual cues. SLP Short Term Goal 3 (Week 1): Patient will demonstrate adequate safety awareness during ADL's and simulated functional tasks with min-modA verbal and visual cues. SLP Short Term Goal 4 (Week 1): Patient will recall specific information (strategies, exercises, precautions, etc) from PT, OT, ST with modA verbal cues.  Skilled Therapeutic Interventions: Pt seen for skilled ST with focus on cognitive goals, wife present throughout. Pt reporting severe knee pain after recent PT session which he states has "pissed me off". SLP facilitating medication task with both pt and wife with discussion on new meds vs old meds and use of TID pillbox wife has purchased. Pt currently has 2 prescriptions taken 4x daily, may need QID box if discharges with these meds. Pt reports he has alarms on his phone as a reminder to take medication. Pt states he got a new phone and doesn't know how to use it, is unable to make phone calls on it and doesn't have a house phone. When SLP expressing concern with pt inability to contact people if he is home alone, wife states their insurance has approved them for Life Alert buttons they will be ordering. SLP facilitating mildly complex problem ID and solution generating task by providing min A cues for detail. Pt able to utilize resources (wife, doctors, etc) appropriately during this task. Pt left in bed with alarm set and wife present for needs. Cont ST POC.  Pain Pain Assessment Pain  Scale: 0-10 Pain Score: 5  Pain Type: Acute pain Pain Location: Knee Pain Orientation: Right;Lateral Pain Intervention(s): Medication (See eMAR)  Therapy/Group: Individual Therapy  [name] 04/12/2021, 3:06 PM

## 2021-04-12 NOTE — Progress Notes (Signed)
Physical Therapy Session Note  Patient Details  Name: Travis Conrad MRN: [id number] Date of Birth: [birth year]  Today's Date: 04/12/2021 PT Individual Time: 0931-1040 PT Individual Time Calculation (min): 69 min   Short Term Goals: Week 1:  PT Short Term Goal 1 (Week 1): STG = LTG due to ELOS  Skilled Therapeutic Interventions/Progress Updates:  Pt received supine in bed asleep, easy to arouse. Pt lethargic throughout session. Pt reported pain in R knee as 7/10 and was premedicated. Donned ted hose w/total A and BP at 120/69 mmHG in supine. Pt performed supine <>sit w/S* and reported dizziness, BP at 124/69 mmHg seated EOB. Emphasis of session on slide board transfers for improved functional mobility and reduced strain on R knee. Provided visual and verbal demonstration of slide board transfer and pt able to teach back 50% to therapist. Pt performed lateral board transfer from bed to [employer] on L side w/min A for board set up. No verbal cues provided for hand placement, body mechanics or head-hip relationship as pt was very efficient w/transfer. Pt then self-propelled 150' in [facility] around unit, navigating obstacles and performing turns for improved cardiovascular endurance and UE strength. Pt was left seated in [facility], wife present, all needs in reach.   Therapy Documentation Precautions:  Precautions Precautions: Fall Precaution Comments: abdominal incision, watch HR and BP Restrictions Weight Bearing Restrictions: No   Therapy/Group: Individual Therapy Cruzita Lederer Tahjanae Blankenburg, PT, DPT  04/12/2021, 7:37 AM

## 2021-04-12 NOTE — Progress Notes (Signed)
Nutrition Follow-up  DOCUMENTATION CODES:   Not applicable  INTERVENTION:  Provide Ensure Enlive po TID, each supplement provides 350 kcal and 20 grams of protein   Provide 30 ml Prosource plus po TID, each supplement provides 100 kcal and 15 grams of protein.    Encourage adequate PO intake.   NUTRITION DIAGNOSIS:   Increased nutrient needs related to post-op healing as evidenced by estimated needs; ongoing  GOAL:   Patient will meet greater than or equal to 90% of their needs; progressing  MONITOR:   PO intake, Supplement acceptance, Labs, Weight trends, Skin, I & O's  REASON FOR ASSESSMENT:    (TPN)    ASSESSMENT:   70 year old male with history of PAF, hyperlipidemia, hypertension, tobacco/alcohol use, benign essential tremors, prior diverticulitis. Presented with increasing abdominal pain nausea and vomiting times several days. CT of abdomen pelvis showed inflammation of the sigmoid colon and a small amount of extraluminal gas, consistent with perforated diverticulitis and abscess. Aspiration and drain placement of intra-abdominal fluid collection and underwent exploratory laparotomy, sigmoid colectomy with end colostomy creation drainage of intra-abdominal abscess. TPN initiated 9/09.  Patient is currently tolerating a soft diet. Therapy evaluations completed due to patient decreased functional mobility was admitted for a comprehensive rehab program.  Meal completion has been varied from 0-100% with most recent intake at 50-100%. Family at bedside has been encouraging pt po intake at meals. Family reports pt has been more sleepy which affects appetite and intake at meals. Pt currently has Ensure and Prosource plus ordered with varied consumption. RD to continue with current orders to aid in caloric and protein needs.   Labs and medications reviewed.   Diet Order:   Diet Order             DIET SOFT Room service appropriate? No; Fluid consistency: Thin  Diet effective  now                   EDUCATION NEEDS:   Not appropriate for education at this time  Skin:  Skin Assessment: Reviewed RN Assessment Skin Integrity Issues:: Incisions Incisions: abdomen  Last BM:  10/25 colostomy  Height:   Ht Readings from Last 1 Encounters:  04/06/21 6' (1.829 m)    Weight:   Wt Readings from Last 1 Encounters:  04/12/21 89 kg   BMI:  Body mass index is 26.61 kg/m.  Estimated Nutritional Needs:   Kcal:  2100-2300  Protein:  105-115 grams  Fluid:  >/= 2 L/day  Corrin Parker, MS, RD, LDN RD pager number/after hours weekend pager number on Amion.

## 2021-04-12 NOTE — Progress Notes (Signed)
Occupational Therapy Session Note  Patient Details  Name: Travis Conrad MRN: [id number] Date of Birth: [birth year]  Today's Date: 04/12/2021 OT Individual Time: 1300-1345 OT Individual Time Calculation (min): 45 min    Short Term Goals: Week 1:  OT Short Term Goal 1 (Week 1): Pt will perform consistently perform sit > stands CGA  with LRAD OT Short Term Goal 2 (Week 1): Pt will don pants with MIN A OT Short Term Goal 3 (Week 1): Pt will tolerate sitting EOB/EOM for 6 min in prep for grooming with close (S)  Skilled Therapeutic Interventions/Progress Updates:    Patient in bed, notes pain in right knee but otherwise pain under control.  Supine to sitting edge of bed with CS, HOB elevated and using bed rails - reviewed with patient and wife standard bed vs hospital bed features and encouraged decreasing dependence.  Able to tolerate sitting edge of bed for 25 minutes with focus on trunk mobility, upper body and lower body conditioning activities with focus on pain control and strategies to increase comfort.  He is able to move to supine position with CS and increased time.  Min A to adjust in bed.   Completed both arm and leg exercises in supine position, min A to manage clothing and urinal for continent void.  He remained in bed at close of session, bed alarm set and call bell in hand.    Therapy Documentation Precautions:  Precautions Precautions: Fall Precaution Comments: abdominal incision, watch HR and BP Restrictions Weight Bearing Restrictions: No   Therapy/Group: Individual Therapy  Carlos Levering 04/12/2021, 7:41 AM

## 2021-04-12 NOTE — Progress Notes (Signed)
Occupational Therapy Session Note  Patient Details  Name: Travis Conrad MRN: [id number] Date of Birth: [birth year]  Today's Date: 04/12/2021 OT Individual Time: 1105-1200 OT Individual Time Calculation (min): 55 min    Short Term Goals: Week 1:  OT Short Term Goal 1 (Week 1): Pt will perform consistently perform sit > stands CGA  with LRAD OT Short Term Goal 2 (Week 1): Pt will don pants with MIN A OT Short Term Goal 3 (Week 1): Pt will tolerate sitting EOB/EOM for 6 min in prep for grooming with close (S)  Skilled Therapeutic Interventions/Progress Updates:   Pt received in wc with his wife present.  Pt stating he needed to get back to bed, but with strong encouragement had pt engage in session.   Set pt up at sink to bathe and dress.  Min cues for thoroughness. He continues to have R knee pain but stated it is slowly getting better.  He does need MAX encouragement to engage in sit to stands at sink and to stand. Had him focus on only putting weight on left leg, pt will moan in pain and act very disgruntled BUT he really only needs light CGA to rise to stand and to maintain standing for 30 seconds. He did engage in LB self care with only mod A.  Pt did acknowledge he moved fairly well and we focused on his success today.   Pt wanted to get in bed as he had been in wc since 9am.  Used slide board with only CGA to close S to move to bed.  S getting into supine.  Pt set up with all needs met. Bed alarm on.   Therapy Documentation Precautions:  Precautions Precautions: Fall Precaution Comments: abdominal incision, watch HR and BP Restrictions Weight Bearing Restrictions: No    Vital Signs: Therapy Vitals Temp: 98.1 F (36.7 C) Pulse Rate: 86 Resp: 18 BP: 112/63 Patient Position (if appropriate): Lying Oxygen Therapy SpO2: 97 % O2 Device: Room Air Pain:   ADL: ADL Grooming: Setup, Minimal cueing Where Assessed-Grooming: Wheelchair, Sitting at sink Upper Body Bathing:  Setup, Minimal cueing Where Assessed-Upper Body Bathing: Edge of bed Lower Body Bathing: Minimal assistance Where Assessed-Lower Body Bathing: Edge of bed Upper Body Dressing: Setup Where Assessed-Upper Body Dressing: Edge of bed Lower Body Dressing: Moderate assistance Where Assessed-Lower Body Dressing: Edge of bed Toileting: Unable to assess (foley & ostomy) Where Assessed-Toileting: Other (Comment) (N/A) Toilet Transfer: Minimal assistance Toilet Transfer Method: Ambulating, Stand pivot (RW) Toilet Transfer Equipment: Bedside commode Tub/Shower Transfer: Unable to assess (safety/medical) ADL Comments:  (some ADLs not assessed d/t incision and ostomy)  Therapy/Group: Individual Therapy  [place] 04/12/2021, 8:40 AM

## 2021-04-12 NOTE — Progress Notes (Signed)
PROGRESS NOTE   Subjective/Complaints:  Pt's reports R knee is feeling better- sleeping well- denies UTI Sx's.  U/A (-).     ROS:   Pt denies SOB, abd pain, CP, N/V/C/D, and vision changes    Objective:   DG CHEST PORT 1 VIEW  Result Date: 04/11/2021 CLINICAL DATA:  Leukocytosis EXAM: PORTABLE CHEST 1 VIEW COMPARISON:  03/29/2021 FINDINGS: Right upper extremity central venous catheter tip overlies the distal SVC. No focal opacity or pleural effusion. Normal cardiomediastinal silhouette with aortic atherosclerosis. No pneumothorax IMPRESSION: No active disease. Electronically Signed   By: Donavan Foil M.D.   On: 04/11/2021 21:42   Recent Labs    04/11/21 0306 04/12/21 0416  WBC 12.5* 10.1  HGB 9.6* 9.1*  HCT 31.1* 28.7*  PLT 502* 500*   Recent Labs    04/11/21 1208  NA 130*  K 3.9  CL 93*  CO2 28  GLUCOSE 119*  BUN 19  CREATININE 0.89  CALCIUM 9.0    Intake/Output Summary (Last 24 hours) at 04/12/2021 0956 Last data filed at 04/12/2021 0725 Gross per 24 hour  Intake 120 ml  Output 800 ml  Net -680 ml        Physical Exam: Vital Signs Blood pressure 112/63, pulse 86, temperature 98.1 F (36.7 C), resp. rate 18, height 6' (1.829 m), weight 89 kg, SpO2 97 %.     General: awake, alert, appropriate, sitting up in bed; NAD HENT: conjugate gaze; oropharynx moist CV: regular rate; no JVD Pulmonary: CTA B/L; no W/R/R- good air movement GI: soft, NT, ND, (+)BS- colostomy in place; vertical incision in place - less deep- packed Psychiatric: appropriate; flat Neurological: alert  Skin: Warm and dry.  Intact.- didn't assess abd wound Musc: No edema in extremities.  No tenderness in extremities. Neuro: Alert Makes eye contact with examiner.   Follows commands.   Motor:  [name]: HF, KE 2+/5 (pain inhibition) [name]: 3/5 proximal to distal  Assessment/Plan: 1. Functional deficits which require 3+ hours  per day of interdisciplinary therapy in a comprehensive inpatient rehab setting. Physiatrist is providing close team supervision and 24 hour management of active medical problems listed below. Physiatrist and rehab team continue to assess barriers to discharge/monitor patient progress toward functional and medical goals  Care Tool:  Bathing    Body parts bathed by patient: Left upper leg, Right upper leg, Front perineal area, Left arm, Right arm, Face, Chest   Body parts bathed by helper: Right lower leg, Left lower leg Body parts n/a: Abdomen   Bathing assist Assist Level: Minimal Assistance - Patient > 75%     Upper Body Dressing/Undressing Upper body dressing   What is the patient wearing?: Pull over shirt    Upper body assist Assist Level: Set up assist    Lower Body Dressing/Undressing Lower body dressing      What is the patient wearing?: Pants, Incontinence brief     Lower body assist Assist for lower body dressing: Moderate Assistance - Patient 50 - 74%     Toileting Toileting Toileting Activity did not occur (Clothing management and hygiene only): N/A (no void or bm) (foley and ostomy)  Toileting assist  Assist for toileting: Dependent - Patient 0% (pt uses a urinal with help.)     Transfers Chair/bed transfer  Transfers assist  Chair/bed transfer activity did not occur: Safety/medical concerns (Fatigue, agitation, pain)  Chair/bed transfer assist level: Minimal Assistance - Patient > 75%     Locomotion Ambulation   Ambulation assist      Assist level: Contact Guard/Touching assist Assistive device: Walker-rolling Max distance: 10'   Walk 10 feet activity   Assist     Assist level: Contact Guard/Touching assist Assistive device: Walker-rolling   Walk 50 feet activity   Assist Walk 50 feet with 2 turns activity did not occur: Safety/medical concerns (Fatigue, agitation, pain)         Walk 150 feet activity   Assist Walk 150 feet  activity did not occur: Safety/medical concerns (Fatigue, agitation, pain)         Walk 10 feet on uneven surface  activity   Assist Walk 10 feet on uneven surfaces activity did not occur: Safety/medical concerns (Fatigue, agitation, pain)         Wheelchair     Assist Is the patient using a wheelchair?: Yes Type of Wheelchair: Manual    Wheelchair assist level: Supervision/Verbal cueing Max wheelchair distance: 150    Wheelchair 50 feet with 2 turns activity    Assist        Assist Level: Supervision/Verbal cueing   Wheelchair 150 feet activity     Assist      Assist Level: Supervision/Verbal cueing   Blood pressure 112/63, pulse 86, temperature 98.1 F (36.7 C), resp. rate 18, height 6' (1.829 m), weight 89 kg, SpO2 97 %.   Medical Problem List and Plan: 1.  Severe sepsis/ debility secondary to perforated diverticulitis status post exploratory laparotomy sigmoid colectomy with end colostomy drainage of intra-abdominal abscess with wound VAC placement 02/26/2021  Con't PT/OT and CIR- team conference today to f/u on d/c date 2.  Antithrombotics: -DVT/anticoagulation:  Pharmaceutical: Other (comment) Eliquis             -antiplatelet therapy: N/A 3. Pain: continue Lidoderm patch, Robaxin 1000 mg 4 times daily, oxycodone as needed  10/24- using kpad for R knee pain- if cannot find source of leukocytosis, might need to get imaging of R knee?  10/25- WBC back to normal- R knee pain better- con't to monitor 4. Mood: Ativan 1 mg every 6 hours as needed             -antipsychotic agents: N/A 5. Neuropsych: This patient is capable of making decisions on his own behalf. 6. Skin/Wound Care: Routine skin checks/colostomy care 7. Fluids/Electrolytes/Nutrition: Routine in and outs 8.  Postoperative anemia.     Hemoglobin 8.9 on 10/21  10/25- Hb 9.1- con't to monitor 9.  Paroxysmal atrial fibrillation.  Continue Amiodarone 200 mg daily, Cardizem 60 mg every 6  hours.  Follow-up cardiology services  Monitor with increased exertion 10.  VRE UTI.  Completed course of Zyvox 04/04/2021.  Contact precautions. 11.  AKI.  Resolved.   12.  Tremors.  Patient was on sotalol at home and currently on hold 13.  Hypertension.   Continue Lasix to 10 mg daily given frequent urination.   14.  Right epididymitis.  Resolved.  Completed course of doxycycline.  Follow-up urology services as needed 15.  Left knee arthritis/effusion.  Responded well to steroid injection. 16.  History of alcohol tobacco abuse.  NicoDerm patch.  Provide counseling 17.  Anasarca.  Decreased nutritional  storage.  Marinol added for appetite stimulant 18.  Tobacco abuse.  NicoDerm patch.  Counseling 19. Insomnia: start Rozarem HS. Resolved. 20. Suboptimal magnesium levels: d/c colace and started magnesium gluconate 250mg  HS.  21. Vitamin D deficiency: level 14 on 10/20. Ergocalciferol 50,000U once per week for 7 weeks ordered. 22. Inadequate oral intake: resolved. D/ced TPN. 23.  Hyponatremia  Sodium 132 on 10/20, labs ordered for Monday  24. R knee pain-   10/24- if cannot find source, of increased WBC, might need R knee imaging  10/25- pain better after meds and kpad- con't to monitor 25. New leukocytosis  10/24- will check U/A and Cx and CXR- and recheck labs in AM- is afebrile-   10/25- CXR and U/A (-)- has resolved on f/u CBC- con't to monitor  LOS: 6 days A FACE TO FACE EVALUATION WAS PERFORMED  Keysean Savino 04/12/2021, 9:56 AM

## 2021-04-12 NOTE — Progress Notes (Signed)
Patient ID: Travis Conrad, male   DOB: 1950/08/01, 70 y.o.   MRN: [id number]  Met with pt and wife who is present in the room to update on team conference goals of supervision as long as he participates and works with therapies. He does report his knee is better today. Discussed if at home and not doing well can use sliding board with wheelchair. Pt feels he can manage and is looking forward to going home on 11/2. He has been in a hospital for a long time and ready to be at home. Pt tends to do what he wants too and wife did not mention anything about not going home. Discussed maybe she should take a break from here and give herself some time. She will think about it. Discussed equipment they have-transport chair, rolling walker, bedside commode and will get tub equipment on their own. Continue to work on discharge needs. Will have neuro-psych see this week.

## 2021-04-12 NOTE — Patient Care Conference (Signed)
Inpatient RehabilitationTeam Conference and Plan of Care Update Date: 04/12/2021   Time: 12:02 PM    Patient Name: Travis Conrad      Medical Record Number: [id number]  Date of Birth: December 01, 1950 Sex: Male         Room/Bed: 4W13C/4W13C-01 Payor Info: Payor: [place] / Plan: [employer] MEDICARE / Product Type: *No Product type* /    Admit Date/Time:  04/06/2021  1:37 PM  Primary Diagnosis:  Sepsis [facility])  Hospital Problems: Principal Problem:   Sepsis ([place]) Active Problems:   Hyponatremia   Acute blood loss anemia    Expected Discharge Date: Expected Discharge Date: 04/20/21  Team Members Present: Physician leading conference: Dr. Courtney Heys Social Worker Present: Ovidio Kin, LCSW Nurse Present: Dorien Chihuahua, RN PT Present: Francena Hanly, PT OT Present: Glean Salen, OT SLP Present: Sherren Kerns, SLP     Current Status/Progress Goal Weekly Team Focus  Bowel/Bladder   Continent of bladder, has episodes of spilling, Colostomy; LBM 10/24  Minimize spilling episodes. Educate pt on ostomy.  Continue education.   Swallow/Nutrition/ Hydration             ADL's   set up UB self care and grooming (improved engagement and cognition from admission), mod A with LB self care. mostly limited by R knee pain for standing tolerance and flexing R knee.  supervision overall  ADL training, activity tolerance, pt/family education, general strengthening   Mobility   Min-mod A for transfers, bed mobility, gait up to 10' w/RW. Very poor STM, easily agitated and unmotivated  S*  Activity tolerance, gait, stairs, transfers, pt/fam edu   Communication   comprehension affected by decreased attention  min A  attention to task   Safety/Cognition/ Behavioral Observations  min A  sup A  attention, problem solving, memory, awareness   Pain   Pain to right knee. Kpad and other prns available.  Pain <3/10  Assess Qshift and Prn   Skin   Incision mid abdomen, wet to dry.  Colostomy LLQ  Daily dressing changes with promotion of healing.  Assess Qshift and PRN     Discharge Planning:  Home with wife who can provide assist, but currently is fighting with his wife. easliy frustrated due to pain issues   Team Discussion: Patient is capable to doing more and managing care however he is dis-engaged, self limiting, dependent upon other's to address own care needs and has a low frustration level. Pain in right knee also limits progress and ambulation. Able to complete a slide board transfer well. UA and CXR negative; ? Xray of knee to r/o issue.  Patient on target to meet rehab goals: Goals for discharge set for supervision overall.   *See Care Plan and progress notes for long and short-term goals.   Revisions to Treatment Plan:  Stretching exercises for muscular tightness in IT band and contracted knees Working on attention, problem solving and awareness of deficits  Teaching Needs: Safety, transfers, incision care, colostomy care, medications, secondary risk management, dietary modifications, etc.  Current Barriers to Discharge: Decreased caregiver support, Home enviroment access/layout, and Behavior  Possible Resolutions to Barriers: Family education with patient and wife HH follow up services DME; [place] recommended Supplies for ostomy delivered to the home      Medical Summary Current Status: continent of urine; has ostomy training already- cursing with pain in R knee; anxiety interupts sleep; abdominal wound wet to dry with packing  Barriers to Discharge: Behavior;Decreased family/caregiver support;Other (  comments);Weight bearing restrictions;Wound care;Medical stability;Home enviroment access/layout  Barriers to Discharge Comments: colostomy- R knee pain- doesn't want to participate- talks down to wife; Possible Resolutions to [employer] Focus: SB transfers- won't WB on R knee- muscular tightness- won't tolerate stretching, in spite of hamstring/IT  band tightness- screaming/cussing at staff about therapy; improved engagement per PT- goals supervision at w/c level.  d/c 04/20/21   Continued Need for Acute Rehabilitation Level of Care: The patient requires daily medical management by a physician with specialized training in physical medicine and rehabilitation for the following reasons: Direction of a multidisciplinary physical rehabilitation program to maximize functional independence : Yes Medical management of patient stability for increased activity during participation in an intensive rehabilitation regime.: Yes Analysis of laboratory values and/or radiology reports with any subsequent need for medication adjustment and/or medical intervention. : Yes   I attest that I was present, lead the team conference, and concur with the assessment and plan of the team.   Dorien Chihuahua B 04/12/2021, 1:56 PM

## 2021-04-13 DIAGNOSIS — I10 Essential (primary) hypertension: Secondary | ICD-10-CM

## 2021-04-13 DIAGNOSIS — E871 Hypo-osmolality and hyponatremia: Secondary | ICD-10-CM | POA: Diagnosis not present

## 2021-04-13 DIAGNOSIS — D62 Acute posthemorrhagic anemia: Secondary | ICD-10-CM | POA: Diagnosis not present

## 2021-04-13 DIAGNOSIS — A419 Sepsis, unspecified organism: Secondary | ICD-10-CM | POA: Diagnosis not present

## 2021-04-13 LAB — GLUCOSE, CAPILLARY
Glucose-Capillary: 112 mg/dL — ABNORMAL HIGH (ref 70–99)
Glucose-Capillary: 109 mg/dL — ABNORMAL HIGH (ref 70–99)
Glucose-Capillary: 86 mg/dL (ref 70–99)

## 2021-04-13 NOTE — Progress Notes (Signed)
Speech Language Pathology Daily Session Note  Patient Details  Name: Travis Conrad MRN: [id number] Date of Birth: 1950/12/26  Today's Date: 04/13/2021 SLP Individual Time: 1345-1435 SLP Individual Time Calculation (min): 50 min  Short Term Goals: Week 1: SLP Short Term Goal 1 (Week 1): Patient will use visual aides to orient to time with minA cues to initiate use. SLP Short Term Goal 2 (Week 1): Patient will sustain attention to basic to mildly complex tasks with modA verbal and visual cues. SLP Short Term Goal 3 (Week 1): Patient will demonstrate adequate safety awareness during ADL's and simulated functional tasks with min-modA verbal and visual cues. SLP Short Term Goal 4 (Week 1): Patient will recall specific information (strategies, exercises, precautions, etc) from PT, OT, ST with modA verbal cues.  Skilled Therapeutic Interventions:   Patient seen for skilled ST session focusing on cognitive goals. Patient in bed, wife in room but was leaving to return home. Patient continues to require significant cues to attempt to engage in tasks and overall participation was poor today when SLP attempting structured tasks. In unstructured conversation, patient is much more engaged and pleasant. He reports "My memory has been going downhill for years" but is unable to state any compensations he makes for this or even demonstrating concern. He was able to recall arm stretches that were recommended and shown to him during an OT session. He did not recall any of the therapist's names or MD's name and when SLP asked him to point out his CIR MD, he said he didn't recognize anyone. Patient was pleased to demonstrate how he could sit edge of bed easier, "Want me to show you?" And he also is encouraged with his knee pain getting better every day. Patient left in bed with all needs within reach. He continues to benefit from skilled SLP intervention to maximize cognitive function prior to discharge.  Pain Pain  Assessment Pain Scale: 0-10 Pain Score: 0-No pain  Therapy/Group: Individual Therapy   Sonia Baller, MA, CCC-SLP Speech Therapy

## 2021-04-13 NOTE — Progress Notes (Signed)
Occupational Therapy Session Note  Patient Details  Name: Travis Conrad MRN: [id number] Date of Birth: 1950/06/24  Today's Date: 04/13/2021 OT Individual Time: 1300-1329 OT Individual Time Calculation (min): 29 min    Short Term Goals: Week 1:  OT Short Term Goal 1 (Week 1): Pt will perform consistently perform sit > stands CGA  with LRAD OT Short Term Goal 2 (Week 1): Pt will don pants with MIN A OT Short Term Goal 3 (Week 1): Pt will tolerate sitting EOB/EOM for 6 min in prep for grooming with close (S)  Skilled Therapeutic Interventions/Progress Updates:  Pt greeted  supine in bed   agreeable to OT intervention. Session focus on  BUE therex to increase stength and endurance for higher level functional mobility tasks. Pt completed therex as indicated below from bed level with level 3 theraband.  X10 shoulder flexion X10 bicep curls X10 punches  X10 chest pulls X10 tricep rows.  Issued pt HEP to increase carryover.  pt left supine in bed with all needs within reach and bed alarm activated.                  Therapy Documentation Precautions:  Precautions Precautions: Fall Precaution Comments: abdominal incision, watch HR and BP Restrictions Weight Bearing Restrictions: No    Pain: Pt reports unrated pain in LUE during therex, encouraged to take out theraband and provided rest breaks as needed.    Therapy/Group: Individual Therapy  Precious Haws 04/13/2021, 3:56 PM

## 2021-04-13 NOTE — Progress Notes (Signed)
Physical Therapy Session Note  Patient Details  Name: HEIDI LEMAY MRN: [id number] Date of Birth: August 26, 1950  Today's Date: 04/13/2021 PT Individual Time: 1000-1055 PT Individual Time Calculation (min): 55 min   Short Term Goals: Week 1:  PT Short Term Goal 1 (Week 1): STG = LTG due to ELOS  Skilled Therapeutic Interventions/Progress Updates:     Patient in bed with his wife at bedside upon PT arrival. Patient alert and agreeable to PT session. Patient reported 1-3/10 R knee pain during session, RN made aware and provided meds during session. PT provided repositioning, rest breaks, and distraction as pain interventions throughout session.   Patient reports improved R knee pain since yesterday, however, on observation, noted increased suprapatellar edema on medial side. Applied Kinesiotape over R knee edema using lymphatic drainage technique with the aim of edema  control. Prior to application, patient denied any history of skin irritation or allergy to adhesive. Educated patient on purpose of kinesiotape placement and signs symptoms of allergic reaction or irritation. Cleaned patient's skin and applied test strip at beginning of session and removed at end of session without sings of skin irritation. Instructed patient that the tape can be left on up to 3 days and can be worn in the shower. Instructed to removed the tape if peeling off, signs of skin irritation arise, or it has been on for >3 days. Informed patient that the tape is best removed in the shower or with a wet wash cloth. Patient stated understanding. Rehab team informed about tape placement to assist with monitoring patient's response.  Therapeutic Activity: Bed Mobility: Patient performed supine to/from sit with supervision and increased time for R lower extremity. Patient surprised by low pain level with movement this session.  Transfers: Patient self selected using the slide board to transfer to the w/c instead of performing  stand pivot. PT allowed SBT due to R knee edema. Educated on starting standing and gait progression tomorrow during PT due to improved pain management with mobility, patient in agreement.   Transported patient to the day room to discussed patient's goals, concerns with mobility, and limitations to progressing with mobility. Patient reports he is highly motivated to walk "everywhere" again and eventually without the RW. Educated on progression to meet this goal and present limitations such as activity tolerance, balance, and pain. Discussed engaging activities to promote mobility in therapies and at home. 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 Patient stated he has not been doing those things since he retired a few years ago. Educated on benefit of selecting engaging and stimulating activities to improve participation in activities of interest and promote mobility. Patient stated understanding.   Patient reported sudden onset of "light headedness" while sitting in the day room. Returned patient to his room via w/c with total A, Vitals WNL in sitting and symptoms resolved in 1-2 min.   Patient in w/c in the room, agreeable to wait 5 min in the w/c for OT to arrive for back-to-back sessions at end of session with breaks locked, chair alarm set, and all needs within reach.   Therapy Documentation Precautions:  Precautions Precautions: Fall Precaution Comments: abdominal incision, watch HR and BP Restrictions Weight Bearing Restrictions: No    Therapy/Group: Individual Therapy  Gursimran Litaker L Dymond Spreen PT, DPT  04/13/2021, 11:13 PM

## 2021-04-13 NOTE — Progress Notes (Signed)
PROGRESS NOTE   Subjective/Complaints: Patient seen sitting up in bed this morning.  He states he did not sleep well overnight, but for no particular reason.  He appears mildly agitated regarding his discharge date.  ROS: Denies CP, SOB, N/V/D  Objective:   DG CHEST PORT 1 VIEW  Result Date: 04/11/2021 CLINICAL DATA:  Leukocytosis EXAM: PORTABLE CHEST 1 VIEW COMPARISON:  03/29/2021 FINDINGS: Right upper extremity central venous catheter tip overlies the distal SVC. No focal opacity or pleural effusion. Normal cardiomediastinal silhouette with aortic atherosclerosis. No pneumothorax IMPRESSION: No active disease. Electronically Signed   By: Donavan Foil M.D.   On: 04/11/2021 21:42   Recent Labs    04/11/21 0306 04/12/21 0416  WBC 12.5* 10.1  HGB 9.6* 9.1*  HCT 31.1* 28.7*  PLT 502* 500*    Recent Labs    04/11/21 1208  NA 130*  K 3.9  CL 93*  CO2 28  GLUCOSE 119*  BUN 19  CREATININE 0.89  CALCIUM 9.0     Intake/Output Summary (Last 24 hours) at 04/13/2021 1057 Last data filed at 04/13/2021 0735 Gross per 24 hour  Intake 660 ml  Output 500 ml  Net 160 ml         Physical Exam: Vital Signs Blood pressure 119/62, pulse 80, temperature 97.7 F (36.5 C), resp. rate 17, height 6' (1.829 m), weight 89 kg, SpO2 97 %. Constitutional: No distress . Vital signs reviewed. HENT: Normocephalic.  Atraumatic. Eyes: EOMI. No discharge. Cardiovascular: No JVD.  RRR. Respiratory: Normal effort.  No stridor.  Bilateral clear to auscultation. GI: Non-distended.  BS +. Skin: Warm and dry.  Intact. Psych: Normal mood.  Normal behavior. Musc: No edema in extremities.  No tenderness in extremities. Neuro: Alert Makes eye contact with examiner.   Follows commands.   Motor:  [name]: HF, KE 2+/5 (pain inhibition), improving [name]: 3/5 proximal to distal  Assessment/Plan: 1. Functional deficits which require 3+ hours per day  of interdisciplinary therapy in a comprehensive inpatient rehab setting. Physiatrist is providing close team supervision and 24 hour management of active medical problems listed below. Physiatrist and rehab team continue to assess barriers to discharge/monitor patient progress toward functional and medical goals  Care Tool:  Bathing    Body parts bathed by patient: Left upper leg, Right upper leg, Front perineal area, Left arm, Right arm, Face, Chest   Body parts bathed by helper: Right lower leg, Left lower leg Body parts n/a: Abdomen   Bathing assist Assist Level: Minimal Assistance - Patient > 75%     Upper Body Dressing/Undressing Upper body dressing   What is the patient wearing?: Pull over shirt    Upper body assist Assist Level: Set up assist    Lower Body Dressing/Undressing Lower body dressing      What is the patient wearing?: Pants, Incontinence brief     Lower body assist Assist for lower body dressing: Moderate Assistance - Patient 50 - 74%     Toileting Toileting Toileting Activity did not occur (Clothing management and hygiene only): N/A (no void or bm) (foley and ostomy)  Toileting assist Assist for toileting: Dependent - Patient 0% (pt uses  a urinal with help.)     Transfers Chair/bed transfer  Transfers assist  Chair/bed transfer activity did not occur: Safety/medical concerns (Fatigue, agitation, pain)  Chair/bed transfer assist level: Minimal Assistance - Patient > 75% (Slide board transfer)     Locomotion Ambulation   Ambulation assist      Assist level: Contact Guard/Touching assist Assistive device: Walker-rolling Max distance: 10'   Walk 10 feet activity   Assist     Assist level: Contact Guard/Touching assist Assistive device: Walker-rolling   Walk 50 feet activity   Assist Walk 50 feet with 2 turns activity did not occur: Safety/medical concerns (Fatigue, agitation, pain)         Walk 150 feet activity   Assist  Walk 150 feet activity did not occur: Safety/medical concerns (Fatigue, agitation, pain)         Walk 10 feet on uneven surface  activity   Assist Walk 10 feet on uneven surfaces activity did not occur: Safety/medical concerns (Fatigue, agitation, pain)         Wheelchair     Assist Is the patient using a wheelchair?: Yes Type of Wheelchair: Manual    Wheelchair assist level: Supervision/Verbal cueing, Set up assist Max wheelchair distance: 150    Wheelchair 50 feet with 2 turns activity    Assist        Assist Level: Supervision/Verbal cueing   Wheelchair 150 feet activity     Assist      Assist Level: Supervision/Verbal cueing   Blood pressure 119/62, pulse 80, temperature 97.7 F (36.5 C), resp. rate 17, height 6' (1.829 m), weight 89 kg, SpO2 97 %.   Medical Problem List and Plan: 1.  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  Antithrombotics: -DVT/anticoagulation:  Pharmaceutical: Other (comment) Eliquis             -antiplatelet therapy: N/A 3. Pain: continue Lidoderm patch, Robaxin 1000 mg 4 times daily, oxycodone as needed Controlled with meds on 10/26 4. Mood: Ativan 1 mg every 6 hours as needed             -antipsychotic agents: N/A 5. Neuropsych: This patient is capable of making decisions on his own behalf. 6. Skin/Wound Care: Routine skin checks/colostomy care 7. Fluids/Electrolytes/Nutrition: Routine in and outs 8.  Postoperative anemia.     Hemoglobin 9.1 on 10/25 9.  Paroxysmal atrial fibrillation.  Continue Amiodarone 200 mg daily, Cardizem 60 mg every 6 hours.  Follow-up cardiology services  Monitor with increased exertion 10.  VRE UTI.  Completed course of Zyvox 04/04/2021.  Contact precautions. 11.  AKI.  Resolved.   12.  Tremors.  Patient was on  sotalol at home and currently on hold 13.  Hypertension.   Continue Lasix to 10 mg daily given frequent urination.   Controlled on 10/26 14.  Right epididymitis.  Resolved.  Completed course of doxycycline.  Follow-up urology services as needed 15.  Left knee arthritis/effusion.  Responded well to steroid injection. 16.  History of alcohol tobacco abuse.  NicoDerm patch.  Provide counseling 17.  Anasarca.  Decreased nutritional storage.  Marinol added for appetite stimulant 18.  Tobacco abuse.  NicoDerm patch.  Counseling 19. Insomnia: start Rozarem HS. Resolved. 20. Suboptimal magnesium levels: d/c colace and started magnesium gluconate 250mg  HS.  21. Vitamin D deficiency: level 14 on 10/20. Ergocalciferol 50,000U once per  week for 7 weeks ordered. 22. Inadequate oral intake: resolved. D/ced TPN. 23.  Hyponatremia  Sodium 130 on 10/24, labs ordered for tomorrow 24. R knee pain-   Improving 25. New leukocytosis: Resolved  U/A and Cx and CXR neg  LOS: 7 days A FACE TO FACE EVALUATION WAS PERFORMED  Travis Conrad Travis Conrad 04/13/2021, 10:57 AM

## 2021-04-13 NOTE — Progress Notes (Signed)
Occupational Therapy Session Note  Patient Details  Name: Travis Conrad MRN: [id number] Date of Birth: 11/08/1950  Today's Date: 04/13/2021 OT Individual Time: 0712-1975 and 1100-1130 OT Individual Time Calculation (min): 25 min and 30 min   Short Term Goals: Week 1:  OT Short Term Goal 1 (Week 1): Pt will perform consistently perform sit > stands CGA  with LRAD OT Short Term Goal 2 (Week 1): Pt will don pants with MIN A OT Short Term Goal 3 (Week 1): Pt will tolerate sitting EOB/EOM for 6 min in prep for grooming with close (S)  Skilled Therapeutic Interventions/Progress Updates:    Visit 1:  Pain: c/o pain in R knee, pt requested to not "work" on his leg as he feels it will get better if it is not aggravated. Pt agreeable to working on bed level exercises as he has an hour prior to next therapy.   Pt worked on UE and [name] AROM exercises while listening to music to help motivate him. Pt participated well in shoulder AROM movements, torso twists, L knee bends, ankle pumps.   Placed slide board under R leg and pt able to tolerate int/ext rotation and hip abd and add.  He attempted small knee flex and ext but unable to tolerate.  Discussed plans for next session to be sitting in wc to wash his hair at the sink.   Pt in bed with all needs met, bed alarm on.    Visit 2: Pain: no c/o R knee pain at rest, only with movement Pt received in wc with spouse present. Pt ready to get his hair washed. Used shampoo tray and pt helped to hold the straps of the tray to keep it in place. Washed and rinsed pt's hair and then he dried it. Completed oral care at the sink and then changed tshirts.  Pt requested to get back to bed. Set up with slide board and then pt moved using scoots vs slides into bed with Supervision. Moved to supine with S . No c/o knee pain.  Pt in bed with all needs met and his spouse present.   Therapy Documentation Precautions:  Precautions Precautions: Fall Precaution  Comments: abdominal incision, watch HR and BP Restrictions Weight Bearing Restrictions: No    Vital Signs: Therapy Vitals Temp: 97.7 F (36.5 C) Pulse Rate: 80 Resp: 17 BP: 119/62 Patient Position (if appropriate): Lying Oxygen Therapy SpO2: 97 % O2 Device: Room Air Pain:   ADL: ADL Grooming: Setup, Minimal cueing Where Assessed-Grooming: Wheelchair, Sitting at sink Upper Body Bathing: Setup, Minimal cueing Where Assessed-Upper Body Bathing: Edge of bed Lower Body Bathing: Minimal assistance Where Assessed-Lower Body Bathing: Edge of bed Upper Body Dressing: Setup Where Assessed-Upper Body Dressing: Edge of bed Lower Body Dressing: Moderate assistance Where Assessed-Lower Body Dressing: Edge of bed Toileting: Unable to assess (foley & ostomy) Where Assessed-Toileting: Other (Comment) (N/A) Toilet Transfer: Minimal assistance Toilet Transfer Method: Ambulating, Stand pivot (RW) Toilet Transfer Equipment: Bedside commode Tub/Shower Transfer: Unable to assess (safety/medical) ADL Comments:  (some ADLs not assessed d/t incision and ostomy)  Therapy/Group: Individual Therapy  [place] 04/13/2021, 8:23 AM

## 2021-04-14 DIAGNOSIS — L299 Pruritus, unspecified: Secondary | ICD-10-CM

## 2021-04-14 DIAGNOSIS — A419 Sepsis, unspecified organism: Secondary | ICD-10-CM | POA: Diagnosis not present

## 2021-04-14 DIAGNOSIS — M25561 Pain in right knee: Secondary | ICD-10-CM | POA: Diagnosis not present

## 2021-04-14 DIAGNOSIS — E871 Hypo-osmolality and hyponatremia: Secondary | ICD-10-CM | POA: Diagnosis not present

## 2021-04-14 LAB — URINE CULTURE: Culture: 60000 — AB

## 2021-04-14 LAB — GLUCOSE, CAPILLARY
Glucose-Capillary: 101 mg/dL — ABNORMAL HIGH (ref 70–99)
Glucose-Capillary: 90 mg/dL (ref 70–99)
Glucose-Capillary: 129 mg/dL — ABNORMAL HIGH (ref 70–99)

## 2021-04-14 MED ORDER — TRIAMCINOLONE ACETONIDE 40 MG/ML IJ SUSP
40.0000 mg | Freq: Once | INTRAMUSCULAR | Status: AC
  Administered 2021-04-14: 40 mg via INTRAMUSCULAR
  Filled 2021-04-14 (×4): qty 1

## 2021-04-14 MED ORDER — HYDROCERIN EX CREA
TOPICAL_CREAM | Freq: Two times a day (BID) | CUTANEOUS | Status: DC
  Administered 2021-04-21: 1 via TOPICAL
  Filled 2021-04-14: qty 113

## 2021-04-14 MED ORDER — PREDNISONE 20 MG PO TABS
40.0000 mg | ORAL_TABLET | Freq: Every day | ORAL | Status: AC
  Administered 2021-04-15 – 2021-04-17 (×3): 40 mg via ORAL
  Filled 2021-04-14 (×3): qty 2

## 2021-04-14 MED ORDER — LIDOCAINE HCL 1 % IJ SOLN
5.0000 mL | Freq: Once | INTRAMUSCULAR | Status: AC
  Administered 2021-04-14: 5 mL via INTRADERMAL
  Filled 2021-04-14 (×4): qty 5

## 2021-04-14 MED ORDER — CAMPHOR-MENTHOL 0.5-0.5 % EX LOTN
TOPICAL_LOTION | CUTANEOUS | Status: DC | PRN
  Filled 2021-04-14: qty 222

## 2021-04-14 NOTE — Progress Notes (Signed)
Physical Therapy Session Note  Patient Details  Name: Travis Conrad MRN: [id number] Date of Birth: 04-Jun-1951  Today's Date: 04/14/2021 PT Individual Time: 0801-0830; 9798-9211 PT Individual Time Calculation (min): 29 min and 27 min  Short Term Goals: Week 1:  PT Short Term Goal 1 (Week 1): STG = LTG due to ELOS  Skilled Therapeutic Interventions/Progress Updates:  Session 1 Pt received supine in bed, reported 3/10 pain in R knee and was agreeable to therapy. Nursing notified to administer pain meds. Emphasis of session on light stretching and pre-gait training for improved functional mobility. Pt performed supine <>sit slowly w/CGA. As knees became flexed over EOB, pt began to yell out in pain and reported a spike in his R knee pain to 8/10. Noted significant effusion in R popliteal region, immobile patella on R side. Dr. Dagoberto Ligas present for rounds and will administer steroid injection to R knee today. Instructed pt on performing gentle LAQ at EOB for gentle muscle stretch, pt able to tolerate 5-8 reps per side. Pt reported nausea and dizziness, vitals WNL and pt performed sit <>supine w/CGA. 2 helpers to scoot pt to [facility]. Pt was left supine in bed, all needs in reach.   Session 2 Pt received supine in bed, wife present. Pt reported 6/10 pain in R knee, was premedicated and had received steroid injection. Pt declined participating in therapy, as he was comfortable in bed. Session spent educating patient on importance of participating in therapy and unrealistic expectations following DC due to poor participation. Pt's wife present but very quiet throughout conversation. Informed pt on recommendation to DC at [facility] level, CSW notified to order WC. Pt unhappy with possibility of leaving at [facility] level, reminded pt he has not been ambulatory in 8+ weeks.Encouraged pt to participate in standing/walking activity during therapy tomorrow, pt stated he would think about it. Pt was left supine in bed, all needs  in reach.   Therapy Documentation Precautions:  Precautions Precautions: Fall Precaution Comments: abdominal incision, watch HR and BP Restrictions Weight Bearing Restrictions: No   Therapy/Group: Individual Therapy Cruzita Lederer Joe Tanney, PT, DPT  04/14/2021, 7:44 AM

## 2021-04-14 NOTE — Progress Notes (Signed)
Applied hypoallergenic bedding to help with itching. Lotion applied as ordered, patient states is helped a little. Call light and personal items all within reach.

## 2021-04-14 NOTE — Progress Notes (Signed)
Speech Language Pathology Weekly Progress and Session Note  Patient Details  Name: Travis Conrad MRN: [id number] Date of Birth: 1950-08-16  Beginning of progress report period: April 07, 2021 End of progress report period: April 14, 2021  Today's Date: 04/14/2021 SLP Individual Time: 1445-1530 SLP Individual Time Calculation (min): 45 min  Short Term Goals: Week 1: SLP Short Term Goal 1 (Week 1): Patient will use visual aides to orient to time with minA cues to initiate use. SLP Short Term Goal 1 - Progress (Week 1): Not met SLP Short Term Goal 2 (Week 1): Patient will sustain attention to basic to mildly complex tasks with modA verbal and visual cues. SLP Short Term Goal 2 - Progress (Week 1): Met SLP Short Term Goal 3 (Week 1): Patient will demonstrate adequate safety awareness during ADL's and simulated functional tasks with min-modA verbal and visual cues. SLP Short Term Goal 3 - Progress (Week 1): Met SLP Short Term Goal 4 (Week 1): Patient will recall specific information (strategies, exercises, precautions, etc) from PT, OT, ST with modA verbal cues. SLP Short Term Goal 4 - Progress (Week 1): Not met  New Short Term Goals: Week 2: SLP Short Term Goal 1 (Week 2): STG's=LTG's (discharge planned for 11/2)  Weekly Progress Updates: Patient has had limited progress with ST over the past week and has met 2 of 4 short term goals this reporting period. Progress has been limited due to poor participation and overall decreased receptiveness to participate in therapeutic tasks as they pertain to cognition. Although patient acknowledges memory changes in particular in which he reports have worsened since hospitalization, he has been inconsistently receptive to addressing via education opportunities and discussions on strategies to maximize functioning. Pt continues to require significant encouragement to participate and more likely to engage in functional unstructured conversational tasks  vs. structured tasks. Patient is currently completing cognitive-linguistic tasks with min-to-mod A verbal and visual cues although unsure if this is true representation of skill due to limited participation. Patient/family education is ongoing. Recommend continued skilled ST intervention to maximize cognitive function and functional independence prior to discharge. May need to consider discharge from [facility] services if participation remains limited.     Intensity: Minumum of 1-2 x/day, 30 to 90 minutes Frequency: 3 to 5 out of 7 days Duration/Length of Stay: 11/2 Treatment/Interventions: Cognitive remediation/compensation;Internal/external aids;Medication managment;Environmental controls;Functional tasks;Patient/family education   Daily Session Skilled Therapeutic Interventions: Pt received semi reclined in bed and agreeable to skilled ST intervention with focus on cognitive goals. Pt exhibited poor participation in therapeutic tasks and resistance to attempts to engage in unstructured conversation for rapport building. Pt expressed he is only interested in watching television and that's all he does at home. Although patient stated "I don't really care about this" he was accepting of general education on compensatory memory strategies including reinforcement of strategies that have been already implemented for patient in his room including visual calendar, daily schedule, written reminders, but would often state "my wife will just do that for me." Pt indicated he has not been in contact with family via phone since being in the hospital because "I don't know how to use phones." He stated he owns a cell phone at home but never learned how to use it. SLP expressed concern for not being able to contact anyone, especially in emergency situations. Pt appeared interested in having his spouse bring in phone for practice and SLP initiated conversation on considering developing a written list of important numbers for  safety upon discharge. SLP wrote patient visual reminder and placed it on window next to bed to request spouse to bring in phone. Patient was left in bed with alarm activated and immediate needs within reach at end of session. Continue per current plan of care.     General    Pain Pain Assessment Pain Score: 4   Therapy/Group: Individual Therapy  Janel Beane T Emaley Applin 04/14/2021, 4:10 PM

## 2021-04-14 NOTE — Progress Notes (Signed)
Occupational Therapy Weekly Progress Note  Patient Details  Name: Travis Conrad MRN: [id number] Date of Birth: January 19, 1951  Beginning of progress report period: April 07, 2021 End of progress report period: April 14, 2021  Today's Date: 04/14/2021 OT Individual Time: 0950-1100 OT Individual Time Calculation (min): 70 min    Patient has met 1 of 3 short term goals.  Pt is progressing in all areas but did not fully meet his goals due to ongoing R knee pain. Pt also occasionally c/o L knee pain.  He often anticipates pain and is self limiting, but he has gradually been able to do more with therapy.   Patient continues to demonstrate the following deficits: muscle weakness and muscle joint tightness, decreased cardiorespiratoy endurance, decreased attention, decreased awareness, decreased problem solving, decreased memory, and delayed processing, and decreased standing balance and decreased balance strategies and therefore will continue to benefit from skilled OT intervention to enhance overall performance with BADL and Reduce care partner burden.  Patient progressing toward most of his long term goals..  Plan of care revisions:  .  LB dressing downgraded to min A.  OT Short Term Goals Week 1:  OT Short Term Goal 1 (Week 1): Pt will perform consistently perform sit > stands CGA  with LRAD OT Short Term Goal 1 - Progress (Week 1): Progressing toward goal OT Short Term Goal 2 (Week 1): Pt will don pants with MIN A OT Short Term Goal 2 - Progress (Week 1): Progressing toward goal OT Short Term Goal 3 (Week 1): Pt will tolerate sitting EOB/EOM for 6 min in prep for grooming with close (S) OT Short Term Goal 3 - Progress (Week 1): Met Week 2:  OT Short Term Goal 1 (Week 2): STGs = LTGs (LB dressing downgraded to min A)  Skilled Therapeutic Interventions/Progress Updates:    Pt received in bed and stating "my R knee is throbbing and now my knee is now L knee is hurting". Worked with pt on  gentle LE AROM exercises in the bed to his tolerance, utilized techniques to help pt to not focus on his pain.  He then agreed to sit up on side of bed and he was able to engage in more LE exercises using towels under feet to "skate" his feet back and forth for hip and knee flexibility.   Pt worked on UE resisted exercises using the blue theraband:  arm pulls/rows,  tricep ext, bicep curls.   Pt then wanted to move back to supine.  He needed to scoot up toward pillow before laying down. Pt able to bear wt through B legs without pain.  From supine, arm pushed to ceiling for sh presses.   His wife arrived at this point.  Discussed the need for pt to take more initiative and try to do things himself vs asking for help, such as adjusting his own pillow or adjusting the HOB using the control. From supine, Pt was then able to flex and ext his R knee slightly more than at the start of the session.   Pt resting in bed with all needs met.    Therapy Documentation Precautions:  Precautions Precautions: Fall Precaution Comments: abdominal incision, watch HR and BP Restrictions Weight Bearing Restrictions: No   Pain: Pain Assessment Pain Score: 5  Pain Location: Generalized Pain Descriptors / Indicators: Aching;Tightness;Radiating;Sharp;Discomfort Pain Onset: On-going Patients Stated Pain Goal: 2 Pain Intervention(s): Medication (See eMAR);Rest;Repositioned;Relaxation ADL: ADL Grooming: Setup, Minimal cueing Where Assessed-Grooming: Wheelchair, Sitting at sink  Upper Body Bathing: Setup, Minimal cueing Where Assessed-Upper Body Bathing: Edge of bed Lower Body Bathing: Minimal assistance Where Assessed-Lower Body Bathing: Edge of bed Upper Body Dressing: Setup Where Assessed-Upper Body Dressing: Edge of bed Lower Body Dressing: Moderate assistance Where Assessed-Lower Body Dressing: Edge of bed Toileting: Unable to assess (foley & ostomy) Where Assessed-Toileting: Other (Comment) (N/A) Toilet  Transfer: Minimal assistance Toilet Transfer Method: Ambulating, Stand pivot (RW) Toilet Transfer Equipment: Bedside commode Tub/Shower Transfer: Unable to assess (safety/medical) ADL Comments:  (some ADLs not assessed d/t incision and ostomy)   Therapy/Group: Individual Therapy  [place] 04/14/2021, 12:11 PM

## 2021-04-14 NOTE — Progress Notes (Signed)
PROGRESS NOTE   Subjective/Complaints:  Pt reports scratching "all over"- less on legs- mainly arms, chest, abdomen due to "rash".   Also R knee is horrific pain- asking what else can do- ktaping not helping.     ROS:  Pt denies SOB, abd pain, CP, N/V/C/D, and vision changes   Objective:   No results found. Recent Labs    04/12/21 0416  WBC 10.1  HGB 9.1*  HCT 28.7*  PLT 500*   Recent Labs    04/11/21 1208  NA 130*  K 3.9  CL 93*  CO2 28  GLUCOSE 119*  BUN 19  CREATININE 0.89  CALCIUM 9.0    Intake/Output Summary (Last 24 hours) at 04/14/2021 1139 Last data filed at 04/14/2021 1005 Gross per 24 hour  Intake 180 ml  Output 700 ml  Net -520 ml        Physical Exam: Vital Signs Blood pressure 131/67, pulse 82, temperature 98.6 F (37 C), temperature source Oral, resp. rate 17, height 6' (1.829 m), weight 84.7 kg, SpO2 98 %.    General: awake, alert, appropriate, irritable- with PT; 2nd time wife at bedside;  NAD HENT: conjugate gaze; oropharynx moist CV: regular rate; no JVD Pulmonary: CTA B/L; no W/R/R- good air movement GI: soft, NT, ND, (+)BS- colostomy in place and abd wound C/D/I Psychiatric: appropriate Neurological: [name] Skin- not raised excoriation on arms, abd and torso- no signs of scabies or other bugs- VERY dry cracking skin Psych: Normal mood.  Normal behavior. Musc: R knee baker's cyst significant Neuro: Alert Makes eye contact with examiner.   Follows commands.   Motor:  [name]: HF, KE 2+/5 (pain inhibition), improving [name]: 3/5 proximal to distal  Assessment/Plan: 1. Functional deficits which require 3+ hours per day of interdisciplinary therapy in a comprehensive inpatient rehab setting. Physiatrist is providing close team supervision and 24 hour management of active medical problems listed below. Physiatrist and rehab team continue to assess barriers to discharge/monitor  patient progress toward functional and medical goals  Care Tool:  Bathing    Body parts bathed by patient: Left upper leg, Right upper leg, Front perineal area, Left arm, Right arm, Face, Chest   Body parts bathed by helper: Right lower leg, Left lower leg Body parts n/a: Abdomen   Bathing assist Assist Level: Minimal Assistance - Patient > 75%     Upper Body Dressing/Undressing Upper body dressing   What is the patient wearing?: Pull over shirt    Upper body assist Assist Level: Set up assist    Lower Body Dressing/Undressing Lower body dressing      What is the patient wearing?: Pants, Incontinence brief     Lower body assist Assist for lower body dressing: Moderate Assistance - Patient 50 - 74%     Toileting Toileting Toileting Activity did not occur Landscape architect and hygiene only): N/A (no void or bm) (foley and ostomy)  Toileting assist Assist for toileting: Dependent - Patient 0% (pt uses a urinal with help.)     Transfers Chair/bed transfer  Transfers assist  Chair/bed transfer activity did not occur: Safety/medical concerns (Fatigue, agitation, pain)  Chair/bed transfer assist level: Minimal Assistance -  Patient > 75% (Slide board transfer)     Locomotion Ambulation   Ambulation assist      Assist level: Contact Guard/Touching assist Assistive device: Walker-rolling Max distance: 10'   Walk 10 feet activity   Assist     Assist level: Contact Guard/Touching assist Assistive device: Walker-rolling   Walk 50 feet activity   Assist Walk 50 feet with 2 turns activity did not occur: Safety/medical concerns (Fatigue, agitation, pain)         Walk 150 feet activity   Assist Walk 150 feet activity did not occur: Safety/medical concerns (Fatigue, agitation, pain)         Walk 10 feet on uneven surface  activity   Assist Walk 10 feet on uneven surfaces activity did not occur: Safety/medical concerns (Fatigue, agitation,  pain)         Wheelchair     Assist Is the patient using a wheelchair?: Yes Type of Wheelchair: Manual    Wheelchair assist level: Supervision/Verbal cueing, Set up assist Max wheelchair distance: 150    Wheelchair 50 feet with 2 turns activity    Assist        Assist Level: Supervision/Verbal cueing   Wheelchair 150 feet activity     Assist      Assist Level: Supervision/Verbal cueing   Blood pressure 131/67, pulse 82, temperature 98.6 F (37 C), temperature source Oral, resp. rate 17, height 6' (1.829 m), weight 84.7 kg, SpO2 98 %.   Medical Problem List and Plan: 1.  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  2.  Antithrombotics: -DVT/anticoagulation:  Pharmaceutical: Other (comment) Eliquis             -antiplatelet therapy: N/A 3. Pain: continue Lidoderm patch, Robaxin 1000 mg 4 times daily, oxycodone as needed 10/27- will do Steroid injection of R knee today 4. Mood: Ativan 1 mg every 6 hours as needed             -antipsychotic agents: N/A 5. Neuropsych: This patient is capable of making decisions on his own behalf. 6. Skin/Wound Care: Routine skin checks/colostomy care 7. Fluids/Electrolytes/Nutrition: Routine in and outs 8.  Postoperative anemia.     Hemoglobin 9.1 on 10/25 9.  Paroxysmal atrial fibrillation.  Continue Amiodarone 200 mg daily, Cardizem 60 mg every 6 hours.  Follow-up cardiology services  Monitor with increased exertion 10.  VRE UTI.  Completed course of Zyvox 04/04/2021.  Contact precautions. 11.  AKI.  Resolved.   12.  Tremors.  Patient was on sotalol at home and currently on hold 13.  Hypertension.   Continue Lasix to 10 mg daily given frequent urination.    Controlled on 10/26 14.  Right epididymitis.  Resolved.  Completed course of doxycycline.  Follow-up urology services as needed 15.  Left knee arthritis/effusion.  Responded well to steroid injection. 16.  History of alcohol tobacco abuse.  NicoDerm patch.  Provide counseling 17.  Anasarca.  Decreased nutritional storage.  Marinol added for appetite stimulant 18.  Tobacco abuse.  NicoDerm patch.  Counseling 19. Insomnia: start Rozarem HS. Resolved. 20. Suboptimal magnesium levels: d/c colace and started magnesium gluconate 250mg  HS.  21. Vitamin D deficiency: level 14 on 10/20. Ergocalciferol 50,000U once per week for 7 weeks ordered. 22. Inadequate oral intake: resolved. D/ced TPN.  23.  Hyponatremia  Sodium 130 on 10/24, labs ordered for tomorrow 24. R knee pain-   10/27- will do steroid injection- very painful and limiting therapy-  25. New leukocytosis: Resolved  U/A and Cx and CXR neg 27. Skin rash  10/27- assessed with Linna Hoff and Anderson Malta- doesn't appear to be scabies- wil do prednisone 40 mg x 3 days and add menthol cream prn and eucerin BID.   R knee injection:  steroid injection was performed at R knee- using 1% plain Lidocaine and 40mg  /1cc of Kenalog. This was well tolerated.  Cleaned with betadine x3 and allowed to dry- then alcohol then injected using 27 gauge 1.5 inch needle- no bleeding or complications.    Informed pt,  Lidocaine will kick in 15 minutes- and wear off tonight- the steroid will kick in tomorrow within 24 hours and take up to 72 hours to fully kick in.   I spent a total of 46 minutes on total care today- >50% coordination of care- doing steroid injection, also d/w wife plan and assessing skin separately.   LOS: 8 days A FACE TO FACE EVALUATION WAS PERFORMED  Jeret Goyer 04/14/2021, 11:39 AM

## 2021-04-14 NOTE — Plan of Care (Signed)
  Problem: RH Dressing Goal: LTG Patient will perform lower body dressing w/assist (OT) Description: LTG: Patient will perform lower body dressing with assist, with/without cues in positioning using equipment (OT) Flowsheets (Taken 04/14/2021 1006) LTG: Pt will perform lower body dressing with assistance level of: (LTG downgraded as pt has increased knee pain with trying to don socks and shoes.) Minimal Assistance - Patient > 75% Note: LTG downgraded as pt has increased knee pain with trying to don socks and shoes.

## 2021-04-15 DIAGNOSIS — R5381 Other malaise: Secondary | ICD-10-CM | POA: Diagnosis not present

## 2021-04-15 DIAGNOSIS — A419 Sepsis, unspecified organism: Secondary | ICD-10-CM | POA: Diagnosis not present

## 2021-04-15 DIAGNOSIS — D72829 Elevated white blood cell count, unspecified: Secondary | ICD-10-CM | POA: Diagnosis not present

## 2021-04-15 DIAGNOSIS — M25561 Pain in right knee: Secondary | ICD-10-CM | POA: Diagnosis not present

## 2021-04-15 DIAGNOSIS — F4323 Adjustment disorder with mixed anxiety and depressed mood: Secondary | ICD-10-CM

## 2021-04-15 LAB — GLUCOSE, CAPILLARY
Glucose-Capillary: 124 mg/dL — ABNORMAL HIGH (ref 70–99)
Glucose-Capillary: 113 mg/dL — ABNORMAL HIGH (ref 70–99)
Glucose-Capillary: 118 mg/dL — ABNORMAL HIGH (ref 70–99)
Glucose-Capillary: 135 mg/dL — ABNORMAL HIGH (ref 70–99)

## 2021-04-15 NOTE — Progress Notes (Signed)
Physical Therapy Weekly Progress Note  Patient Details  Name: Travis Conrad MRN: [id number] Date of Birth: 20-Oct-1950  Beginning of progress report period: April 07, 2021 End of progress report period: April 15, 2021  Today's Date: 04/15/2021 PT Individual Time: 0801-0829; 8921-1941 PT Individual Time Calculation (min): 28 min and 24 min  Patient's short term goals are equal to his long term goals due to intial estimated LOS. LOS has been extended due to significant R knee pain interfering with his ability to tolerate weightbearing activity. Pt has made very little progress in CIR due to poor participation, agitation and pain. Pt and his wife have been educated on importance in participation and pt performing ADLs on his own for improved independence and functional gains during his stay. Pt continues to request his wife/nursing perform tasks for him and is not agreeable to therapy that involves ambulation. After receiving steroid injection to R knee yesterday, pt able to tolerate more gait activity but requires extensive encouragement to participate.   Patient continues to demonstrate the following deficits muscle weakness and muscle joint tightness, decreased cardiorespiratoy endurance, unbalanced muscle activation, decreased coordination, and decreased motor planning, decreased visual perceptual skills, decreased attention, decreased awareness, decreased problem solving, decreased safety awareness, decreased memory, and delayed processing, and decreased standing balance and decreased balance strategies and therefore will continue to benefit from skilled PT intervention to increase functional independence with mobility.  Patient progressing toward long term goals..  Continue plan of care.  PT Short Term Goals Week 1:  PT Short Term Goal 1 (Week 1): STG = LTG due to ELOS PT Short Term Goal 1 - Progress (Week 1): Progressing toward goal Week 2:  PT Short Term Goal 1 (Week 2): STG = LTG  due to LOS  Skilled Therapeutic Interventions/Progress Updates:  Session 1 Pt received supine in bed, reported significant decrease in R knee pain compared to yesterday and denied pain at rest. Emphasis of session on gait training and improved weightbearing tolerance. Pt performed bed mobility w/S* and removed pt's ted hose at EOB w/total A. Pt reported his ted hose had been left on for >48hrs, educated pt on importance of having them removed overnight. Donned socks w/total A and pt performed sit <>stand from EOB w/S*.   Gait training -Pt ambulated 10' and 20' w/RW and CGA. Noted kyphotic posture, mishandling of RW, decreased step clearance bilaterally and sustained knee flexion from initial contact to midstance bilaterally. Mod verbal cues for safe use of RW, increased step clearance and gaze focused ahead rather than at feet to facilitate upright posture. Pt unresponsive to cues. Pt reported 7/10 pain in R knee and requested to sit down. Stand <>sit w/mod A as pt refused to look behind him to locate chair and therapist utilized max tactile cues to guide hips to chair.   Pt transported back to room w/total A and encouraged to stay sitting up to increase endurance and stamina, pt verbalized understanding. Pt was left seated in WC in room, all needs in reach.   Session 2 Pt received supine in bed, wife present, denied pain at rest. Pt required heavy encouragement to participate in therapy. Pt performed supine <>sit and sit <> stands throughout session w/S*. Pt ambulated 10' to [facility] w/RW and CGA, required mod A for stand <>sit due to pt refusal to turn to look for chair or reach back. Pt ambulated 20' and 40' w/ RW and CGA. Noted similar gait pattern as detailed above in previous session. Pt  unresponsive to cues and became agitated. Encouraged pt to participate in fall festival, pt responded it "was stupid and the dog is ugly". Pt required extensive encouragement to perform second gait trial of 40', stating  therapist was "making him angry" and "he walked as far as he could already and you should stop pushing me". However, upon walking the 40' to room, pt began to state how he is only limited by his knee pain and "could do any thing he wants". Reminded pt of his deconditioning and encouraged him to continue to participate to improve endurance, strength and stamina. Pt performed sit <>supine w/S* and was left supine in bed, all needs in reach.   Therapy Documentation Precautions:  Precautions Precautions: Fall Precaution Comments: abdominal incision, watch HR and BP Restrictions Weight Bearing Restrictions: No   Therapy/Group: Individual Therapy  Cruzita Lederer Julen Rubert, PT, DPT 04/15/2021, 7:42 AM

## 2021-04-15 NOTE — Progress Notes (Signed)
Speech Language Pathology Daily Session Note  Patient Details  Name: Travis Conrad MRN: [id number] Date of Birth: [birth year]  Today's Date: 04/15/2021 SLP Individual Time: 1415-1515 SLP Individual Time Calculation (min): 60 min  Short Term Goals: Week 2: SLP Short Term Goal 1 (Week 2): STG's=LTG's (discharge planned for 11/2)  Skilled Therapeutic Interventions: Pt received semi reclined in bed and agreeable to skilled ST intervention with focus on cognitive goals. Spouse was present and participative throughout treatment and receptive to education to support cognitive functioning. Pt was more participative this date however required significant encouragement to motivate. Pt continues to endorse minimal motivation for anything besides watching television and doing yard work in which he is currently limited due to physical impairments and Afib. Pt agreeable to further discussion on compensatory memory strategies and training on phone use. Although patient does not have his personal cell phone with him, his spouse has the same phone (Android) and allowed pt to use for training purposes. SLP facilitated locating/navigating basic phone functions with min A progressing to sup A verbal cues for 1. turning on/off, 2. swiping up to access home screen, 3. locating call button, 4. contacts list, 5. initiating call, 6. ending call. Pt recalled and demonstrated use of the above functions x2 and requested to discontinue by expressing "that's enough of that." SLP proceeded to provide additional education on attention and memory strategies to maximize safety and functioning in home and community. Pt expressed he feels stressed because he is unsure if he will be able to physically get in/out of a vehicle at discharge which appeared to increase irritability. Spouse observed providing verbal encouragement and reassurance. Patient was left in bed with alarm activated and immediate needs within reach at end of session.  Continue per current plan of care pending pt patient participation and receptiveness to treatment.      Pain Pain Assessment Pain Scale: 0-10 Pain Score: 0-No pain  Therapy/Group: Individual Therapy  Patty Sermons 04/15/2021, 3:13 PM

## 2021-04-15 NOTE — Consult Note (Signed)
Neuropsychological Consultation   Patient:   Travis Conrad   DOB:   Aug 29, 1950  MR Number:  [id number]  Location:  [facility] [place] [id number] [place] [address] Dept: [phone] Loc: [phone]           Date of Service:   04/15/2021  Start Time:   10 AM End Time:   11 AM  Provider/Observer:  Ilean Skill, Psy.D.       Clinical Neuropsychologist       Billing Code/Service: 63785  Chief Complaint:    Travis Conrad is a 70 year old male with a history of PAF status post ablation on 10/02/2020 and followed by cardiology.  Patient has been maintained on Eliquis.  Patient with past medical history including hyperlipidemia, hypertension, tobacco/alcohol use, benign essential tremor and diverticulitis.  Patient presented on 02/20/2021 with increasing abdominal pain nausea and vomiting over the prior several days with decrease in appetite and decreased urination.  On presentation to ED he was mildly hypertensive and noted WBC of 21,000 as well as AKI.  CT of abdomen/pelvis showed inflammation in his colon and a small amount of extraluminal gas consistent with perforated diverticulitis and abscess.  Patient started on antibiotics and close monitoring of his blood pressure as well as consultation with IR for aspiration and drain placement and later underwent exploratory laparotomy etc. with wound VAC placement on 02/26/2021.  Hospital course included bouts of delirium and postoperative anemia.  Patient with decreased functional mobility and was admitted to the comprehensive rehabilitation program.  Reason for Service:  Patient was referred for neuropsychological consultation to address issues related to his prior cognitive and mental status changes as well as work on coping and adjustment issues.  Below see HPI for the current admission.  HPI: Travis Conrad is a 70 year old right-handed male with  history of PAF status post ablation 10/03/2019 followed by Dr. [name] maintained on Eliquis, hyperlipidemia, hypertension, tobacco/alcohol use, benign essential tremors, prior diverticulitis.  Per chart review patient lives with spouse.  Independent prior to admission.  1 level home 3 steps to entry.  Presented 02/20/2021 with increasing abdominal pain nausea and vomiting times several days as well as decrease in appetite and decreased urination.  On presentation to the ED he was mildly hypotensive with systolic blood pressure in the 70s.  Noted WBC of 21,000 as well as AKI with creatinine 3.2, lipase 22, lactic acid 1.3.  CT of abdomen pelvis showed inflammation of the sigmoid colon and a small amount of extraluminal gas, consistent with perforated diverticulitis and abscess.  He was started on Zosyn in the ED as well as IV fluids and low-dose Levophed.  A nasogastric tube for gastric decompression.  Close monitoring of blood pressure as well as history of PAF requiring intravenous amiodarone with echocardiogram completed 02/23/2021 showing ejection fraction of 60 to 65%.  Patient was seen in consultation by interventional radiology 02/24/2021 for aspiration and drain placement of intra-abdominal fluid collection per Dr.Jon Mugweru and later underwent exploratory laparotomy, sigmoid colectomy with end colostomy creation drainage of intra-abdominal abscess with wound VAC placement 02/26/2021 per surgery Dr.  Kieth Brightly.  Hospital course complicated by bouts of delirium developed postoperative anemia required 1 unit packed red blood cells with latest hemoglobin of 9.  Patient was cleared to resume Eliquis as prior to admission for PAF.  Urology consulted 03/02/2021 due to right scrotal pain with scrotal ultrasound demonstrating findings consistent with right epididymitis  and a reactive complex hydrocele.  Developed VRE UTI placed on Zyvox through 04/04/2021 and maintained on contact precautions.   Developed left knee pain  findings of positive effusion underwent left knee aspiration  injection with good toleration, fluid showed no crystals, gram stain with no organisms.  In regards to patient's AKI initially maintained on TPN renal function much improved latest creatinine 0.8 to monitoring of sodium 128.  Patient is currently tolerating a mechanical soft diet.  Therapy evaluations completed due to patient decreased functional mobility was admitted for a comprehensive rehab program.  Current Status:  Upon entering the room, the patient was slightly elevated in the supine position and raised the head of his bed somewhat during our visit.  He was alert and oriented and admitted to significant frustration and lack of motivation at times historically.  Patient reports that he had gotten to the point that he was essentially not giving effort in his life overall and being increasingly frustrated with his reduction in life functioning.  Patient has been prescribed lorazepam 1 mg every 6 hours or as needed as needed.  He reports that he has been very agitated at times and frustrated but is increasingly aware that he is in need of rehabilitative efforts and has begun to identify his desire to improve after seeing some functional gains.  He acknowledges that there are times when he is required others to give him a great deal of encouragement motivation and feels like their efforts have helped him do more than he would have done independently.  He reports that when he initially came to the CIR that he was frustrated and really did not want to stay on the unit and fully participate but now as he is ending his rehabilitative efforts is wanting to put more effort realizing how much his wife will have to do when he is discharged.  Patient reports that he has historically been rather abrupt with people and difficult to externally motivated.  He reports that his persistent loss of functioning and now significant medical complications have forced him  to realize that if he does not begin doing more and engaging more that he will continue to lose function.  As far as cognitive functioning, the patient was oriented with good mental status and cognition.  He reports that as he has been doing better medically that he is beginning to feel more like himself and is returning to baseline cognitively.  The patient displayed recall of recent events efficiently and there were no indications of significant cognitive impairments during our visit.  Behavioral Observation: Travis Conrad  presents as a 70 y.o.-year-old Right handed Caucasian Male who appeared his stated age. his dress was Appropriate and he was Well Groomed and his manners were Appropriate to the situation.  his participation was indicative of Appropriate and Redirectable behaviors.  There were physical disabilities noted.  he displayed an appropriate level of cooperation and motivation.     Interactions:    Active Appropriate  Attention:   within normal limits and attention span and concentration were age appropriate  Memory:   within normal limits; recent and remote memory intact  Visuo-spatial:  not examined  Speech (Volume):  normal  Speech:   normal; normal  Thought Process:  Coherent and Relevant  Though Content:  WNL; not suicidal and not homicidal  Orientation:   person, place, time/date, and situation  Judgment:   Fair  Planning:   Poor  Affect:    Irritable  Mood:  Dysphoric  Insight:   Fair  Intelligence:   normal  Medical History:   Past Medical History:  Diagnosis Date   Atrial fibrillation ([facility])    Dysrhythmia    a-fib   High cholesterol    "RX made groin hurt" (08/14/2016)   Hypertension          Patient Active Problem List   Diagnosis Date Noted   Adjustment disorder with mixed anxiety and depressed mood    Benign essential HTN    Hyponatremia    Acute blood loss anemia    Sepsis ([facility]) 04/06/2021   Intra-abdominal abscess ([facility])     Epididymitis    Diverticulitis 02/20/2021   AKI (acute kidney injury) ([facility]) 02/20/2021   Severe sepsis with acute organ dysfunction ([facility]) 02/20/2021   Diverticulitis of large intestine with perforation and abscess without bleeding 02/19/2021   Secondary hypercoagulable state ([facility]) 11/03/2019   Diverticulitis of intestine with abscess 11/19/2016   Atrial fibrillation, chronic ([facility]) 11/19/2016   Elevated cholesterol 11/19/2016   Essential hypertension 11/19/2016   Paroxysmal atrial fibrillation ([facility]) 08/14/2016   Psychiatric History:  Patient with no prior formal psychiatric history  Family Med/Psych History:  Family History  Problem Relation Age of Onset   Other Brother     Risk of Suicide/Violence: virtually non-existent patient denies any suicidal or homicidal ideation.  Impression/DX:  Travis Conrad is a 70 year old male with a history of PAF status post ablation on 10/02/2020 and followed by cardiology.  Patient has been maintained on Eliquis.  Patient with past medical history including hyperlipidemia, hypertension, tobacco/alcohol use, benign essential tremor and diverticulitis.  Patient presented on 02/20/2021 with increasing abdominal pain nausea and vomiting over the prior several days with decrease in appetite and decreased urination.  On presentation to ED he was mildly hypertensive and noted WBC of 21,000 as well as AKI.  CT of abdomen/pelvis showed inflammation in his colon and a small amount of extraluminal gas consistent with perforated diverticulitis and abscess.  Patient started on antibiotics and close monitoring of his blood pressure as well as consultation with IR for aspiration and drain placement and later underwent exploratory laparotomy etc. with wound VAC placement on 02/26/2021.  Hospital course included bouts of delirium and postoperative anemia.  Patient with decreased functional mobility and was admitted to the comprehensive rehabilitation program.  Upon entering  the room, the patient was slightly elevated in the supine position and raised the head of his bed somewhat during our visit.  He was alert and oriented and admitted to significant frustration and lack of motivation at times historically.  Patient reports that he had gotten to the point that he was essentially not giving effort in his life overall and being increasingly frustrated with his reduction in life functioning.  Patient has been prescribed lorazepam 1 mg every 6 hours or as needed as needed.  He reports that he has been very agitated at times and frustrated but is increasingly aware that he is in need of rehabilitative efforts and has begun to identify his desire to improve after seeing some functional gains.  He acknowledges that there are times when he is required others to give him a great deal of encouragement motivation and feels like their efforts have helped him do more than he would have done independently.  He reports that when he initially came to the CIR that he was frustrated and really did not want to stay on the unit and fully participate but now as he  is ending his rehabilitative efforts is wanting to put more effort realizing how much his wife will have to do when he is discharged.  Patient reports that he has historically been rather abrupt with people and difficult to externally motivated.  He reports that his persistent loss of functioning and now significant medical complications have forced him to realize that if he does not begin doing more and engaging more that he will continue to lose function.  As far as cognitive functioning, the patient was oriented with good mental status and cognition.  He reports that as he has been doing better medically that he is beginning to feel more like himself and is returning to baseline cognitively.  The patient displayed recall of recent events efficiently and there were no indications of significant cognitive impairments during our  visit.  Disposition/Plan:  Today we worked on coping and adjustment issues and working on Radiographer, therapeutic around dealing with his lack of motivation and efforts up to this point and strategies for increasing his engagement.  The patient was initially very abrupt and irritable but with time he opened up and became much more engaged in her conversations.  The visit ultimately became very positive in nature and he was fully engaged voicing his desire to become more deliberate if in his efforts for the final couple of days he has on the unit.  Diagnosis:    Leukocytosis - Plan: DG CHEST PORT 1 VIEW, DG CHEST PORT 1 VIEW         Electronically Signed   _______________________ Ilean Skill, Psy.D. Clinical Neuropsychologist

## 2021-04-15 NOTE — Progress Notes (Signed)
Patient ID: Travis Conrad, male   DOB: [birth year], 70 y.o.   MRN: [id number] Wife has cancelled the wheelchair and transfer board due to co-pay cost. Pt is doing better today with knee pain after injection yesterday and hopeful he will continue to feel better each day. His goal is to walk up the three steps he has to get into their home. He realizes he will need to push himself even if he does not feel like it to progress for the next few days before discharge 11/2

## 2021-04-15 NOTE — Progress Notes (Signed)
Occupational Therapy Session Note  Patient Details  Name: Travis Conrad MRN: [id number] Date of Birth: March 14, 1951  Today's Date: 04/15/2021 OT Individual Time: 3614-4315 OT Individual Time Calculation (min): 53 min    Short Term Goals: Week 2:  OT Short Term Goal 1 (Week 2): STGs = LTGs (LB dressing downgraded to min A)  Skilled Therapeutic Interventions/Progress Updates:  Pt greeted seated in w/c  agreeable to OT intervention. Pt reports R knee pain is a lot bettter. Session focus on BADL reeducation and functional SB transfers. Pt transported to sink in w/c with total A where pt completed seated grooming with supervision from w/c level. Pt transported to gym with total A where pt completed multiple SB tranfers from w/c<>EOM with CGA. Pt even practiced up hill transfers to EOM set at 24 inches as pt reports his bed at home is tall, pt states he will ask his wife to measure his bed at home. Pt requires MAX cues to carryover cues related to SB placement but by end of session able to place board from w/c and EOM with set- up assist. Discussed toilet transfers however per communication with OTR pt likely to use urinal at home and has ostomy for bowel purposes. Did have pt practice SB transfer to drop arm BSC from w/c as added challenge with pt completing transfer with CGA but needed MAX A to place board (d/t increased length of board). Pt did complete sit<>stand from w/c with CGA for clothing mgmt without realizing it, no c/o knee pain when pt stood. Pt requires repeated cues to carryover education as pt having difficulty sequencing steps for toileting even using urinal. Pt completed additional SB transfer from w/c>EOB with CGA pt MIN A to place board.  pt left  supine in bed with bed alarm activated and all needs within reach.                   Therapy Documentation Precautions:  Precautions Precautions: Fall Precaution Comments: abdominal incision, watch HR and BP Restrictions Weight  Bearing Restrictions: No  Pain: pt reports unrated pain in R knee occasionally during session, offered rest breaks and repositioning as pain mgmt, offered ice with pt declining.     Therapy/Group: Individual Therapy  Corinne Ports [facility] 04/15/2021, 10:00 AM

## 2021-04-15 NOTE — Progress Notes (Signed)
PROGRESS NOTE   Subjective/Complaints:  Pt reports R knee swelling feels better and knee overall a little better.  Itching also a lot better in RUE and a little overall.   Likes the cream from tubes "the best". For itching  ROS:   Pt denies SOB, abd pain, CP, N/V/C/D, and vision changes   Objective:   No results found. No results for input(s): WBC, HGB, HCT, PLT in the last 72 hours.  No results for input(s): NA, K, CL, CO2, GLUCOSE, BUN, CREATININE, CALCIUM in the last 72 hours.   Intake/Output Summary (Last 24 hours) at 04/15/2021 0924 Last data filed at 04/15/2021 0254 Gross per 24 hour  Intake 240 ml  Output 1250 ml  Net -1010 ml        Physical Exam: Vital Signs Blood pressure 120/62, pulse 86, temperature (!) 97.5 F (36.4 C), resp. rate 14, height 6' (1.829 m), weight 86.5 kg, SpO2 97 %.     General: awake, alert, appropriate, supine in bed; more polite; NAD HENT: conjugate gaze; oropharynx moist CV: regular rate; no JVD Pulmonary: CTA B/L; no W/R/R- good air movement GI: soft, NT, ND, (+)BS Psychiatric: appropriate- more polite/more interactive Neurological: alert  Skin- not raised excoriation on arms, abd and torso- no signs of scabies or other bugs- VERY dry cracking skin- looks somewhat better and less scratching seen Musc: R knee baker's cyst significant Neuro: Alert Makes eye contact with examiner.   Follows commands.   Motor:  [name]: HF, KE 2+/5 (pain inhibition), improving [name]: 3/5 proximal to distal  Assessment/Plan: 1. Functional deficits which require 3+ hours per day of interdisciplinary therapy in a comprehensive inpatient rehab setting. Physiatrist is providing close team supervision and 24 hour management of active medical problems listed below. Physiatrist and rehab team continue to assess barriers to discharge/monitor patient progress toward functional and medical goals  Care  Tool:  Bathing    Body parts bathed by patient: Left upper leg, Right upper leg, Front perineal area, Left arm, Right arm, Face, Chest   Body parts bathed by helper: Right lower leg, Left lower leg Body parts n/a: Abdomen   Bathing assist Assist Level: Minimal Assistance - Patient > 75%     Upper Body Dressing/Undressing Upper body dressing   What is the patient wearing?: Pull over shirt    Upper body assist Assist Level: Set up assist    Lower Body Dressing/Undressing Lower body dressing      What is the patient wearing?: Pants, Incontinence brief     Lower body assist Assist for lower body dressing: Moderate Assistance - Patient 50 - 74%     Toileting Toileting Toileting Activity did not occur Landscape architect and hygiene only): N/A (no void or bm) (foley and ostomy)  Toileting assist Assist for toileting: Dependent - Patient 0% (pt uses a urinal with help.)     Transfers Chair/bed transfer  Transfers assist  Chair/bed transfer activity did not occur: Safety/medical concerns (Fatigue, agitation, pain)  Chair/bed transfer assist level: Minimal Assistance - Patient > 75% (Slide board transfer)     Locomotion Ambulation   Ambulation assist      Assist level: Contact Guard/Touching  assist Assistive device: Walker-rolling Max distance: 10'   Walk 10 feet activity   Assist     Assist level: Contact Guard/Touching assist Assistive device: Walker-rolling   Walk 50 feet activity   Assist Walk 50 feet with 2 turns activity did not occur: Safety/medical concerns (Fatigue, agitation, pain)         Walk 150 feet activity   Assist Walk 150 feet activity did not occur: Safety/medical concerns (Fatigue, agitation, pain)         Walk 10 feet on uneven surface  activity   Assist Walk 10 feet on uneven surfaces activity did not occur: Safety/medical concerns (Fatigue, agitation, pain)         Wheelchair     Assist Is the patient  using a wheelchair?: Yes Type of Wheelchair: Manual    Wheelchair assist level: Supervision/Verbal cueing, Set up assist Max wheelchair distance: 150    Wheelchair 50 feet with 2 turns activity    Assist        Assist Level: Supervision/Verbal cueing   Wheelchair 150 feet activity     Assist      Assist Level: Supervision/Verbal cueing   Blood pressure 120/62, pulse 86, temperature (!) 97.5 F (36.4 C), resp. rate 14, height 6' (1.829 m), weight 86.5 kg, SpO2 97 %.   Medical Problem List and Plan: 1.  Severe sepsis/ debility secondary to perforated diverticulitis status post exploratory laparotomy sigmoid colectomy with end colostomy drainage of intra-abdominal abscess with wound VAC placement 02/26/2021  Con't PT and OT- CIR_ con't to encourage pt to work with therapy daily 2.  Antithrombotics: -DVT/anticoagulation:  Pharmaceutical: Other (comment) Eliquis             -antiplatelet therapy: N/A 3. Pain: continue Lidoderm patch, Robaxin 1000 mg 4 times daily, oxycodone as needed 10/27- will do Steroid injection of R knee today  10/28- pain a little to somewhat better per pt- less swelling- con't regimen 4. Mood: Ativan 1 mg every 6 hours as needed             -antipsychotic agents: N/A 5. Neuropsych: This patient is capable of making decisions on his own behalf. 6. Skin/Wound Care: Routine skin checks/colostomy care 7. Fluids/Electrolytes/Nutrition: Routine in and outs 8.  Postoperative anemia.     Hemoglobin 9.1 on 10/25 9.  Paroxysmal atrial fibrillation.  Continue Amiodarone 200 mg daily, Cardizem 60 mg every 6 hours.  Follow-up cardiology services  10/28- continues to see in RRR- con't regimen  Monitor with increased exertion 10.  VRE UTI.  Completed course of Zyvox 04/04/2021.  Contact precautions. 11.  AKI.  Resolved.   12.  Tremors.  Patient was on sotalol at home and currently on hold 13.  Hypertension.   Continue Lasix to 10 mg daily given frequent  urination.   Controlled on 10/26 14.  Right epididymitis.  Resolved.  Completed course of doxycycline.  Follow-up urology services as needed 15.  Left knee arthritis/effusion.  Responded well to steroid injection. 16.  History of alcohol tobacco abuse.  NicoDerm patch.  Provide counseling 17.  Anasarca.  Decreased nutritional storage.  Marinol added for appetite stimulant 18.  Tobacco abuse.  NicoDerm patch.  Counseling 19. Insomnia: start Rozarem HS. Resolved. 20. Suboptimal magnesium levels: d/c colace and started magnesium gluconate 250mg  HS.  21. Vitamin D deficiency: level 14 on 10/20. Ergocalciferol 50,000U once per week for 7 weeks ordered. 22. Inadequate oral intake: resolved. D/ced TPN. 23.  Hyponatremia  Sodium 130 on  10/24,   10/28- labs Monday 24. R knee pain-   10/27- will do steroid injection- very painful and limiting therapy-   10/28- doing better- con't regimen 25. New leukocytosis: Resolved  U/A and Cx and CXR neg 27. Skin rash  10/27- assessed with Linna Hoff and Anderson Malta- doesn't appear to be scabies- wil do prednisone 40 mg x 3 days and add menthol cream prn and eucerin BID.   10/28- itching better- might also need cortisone cream- will monitor  R knee injection:done 10/27  steroid injection was performed at R knee- using 1% plain Lidocaine and 40mg  /1cc of Kenalog. This was well tolerated.  Cleaned with betadine x3 and allowed to dry- then alcohol then injected using 27 gauge 1.5 inch needle- no bleeding or complications.    Informed pt,  Lidocaine will kick in 15 minutes- and wear off tonight- the steroid will kick in tomorrow within 24 hours and take up to 72 hours to fully kick in.     LOS: 9 days A FACE TO FACE EVALUATION WAS PERFORMED  Berline Semrad 04/15/2021, 9:24 AM

## 2021-04-16 LAB — GLUCOSE, CAPILLARY
Glucose-Capillary: 134 mg/dL — ABNORMAL HIGH (ref 70–99)
Glucose-Capillary: 148 mg/dL — ABNORMAL HIGH (ref 70–99)
Glucose-Capillary: 168 mg/dL — ABNORMAL HIGH (ref 70–99)
Glucose-Capillary: 143 mg/dL — ABNORMAL HIGH (ref 70–99)

## 2021-04-16 NOTE — Progress Notes (Signed)
Speech Language Pathology Discharge Summary  Patient Details  Name: Travis Conrad MRN: [id number] Date of Birth: 01-15-1951  Today's Date: 04/16/2021 SLP Individual Time: 1402-1430 SLP Individual Time Calculation (min): 28 min   Skilled Therapeutic Interventions:  Pt see for skilled ST with focus on cognitive skills, in bed and agreeable to therapeutic tasks. Pt reports understandable anxiety about discharge next week with regards to remaining physical impairments. SLP facilitating problem solving task for mobility, fall prevention and potential hazard recognition at home by providing Supervision level cues. Wife will be present 24/7 for supervision/assist. Pt endorses ongoing cognitive decline since prolonged hospitalization but feels as though being in home environment and continuing [facility] services will help. SLP answering all questions to satisfaction about POC and discharge. Pt left in bed with alarm set and all needs within reach.    Patient has met 6 of 6 long term goals.  Patient to discharge at overall Supervision;Min level.   Clinical Impression/Discharge Summary: Pt progress during CIR was inconsistent due to poor motivation at times but patient has met 6 out of 6 long term goals of treatment. Pt is at Supervision level for functional comprehension, problem solving and awareness, min A level for memory and attention. Pt demonstrates increased awareness of physical and cognitive impairments and how these will impact function at home. Pt's wife has been present for most tx sessions and provided extensive education on pts current cognitive deficits and effective compensatory strategies for discharge environment. Pt will require 24/7 supervision due to cognitive impairments which wife is able to provide. Pt lives a rather sedentary lifestyle, mostly watching TV and staying home. Wife is encouraged to engage patient is cognitively stimulating activities to preserve current function but wife  will be responsible for all higher level cognitive tasks (med Mudlogger, Comptroller, home management, etc). Recommend HHST services at discharge for further cognitive intervention.  Care Partner:  Caregiver Able to Provide Assistance: Yes  Type of Caregiver Assistance: Physical;Cognitive  Recommendation:  24 hour supervision/assistance;Home Health SLP  Rationale for SLP Follow Up: Maximize cognitive function and independence;Reduce caregiver burden   Reasons for discharge: Treatment goals met;Discharged from hospital   Patient/Family Agrees with Progress Made and Goals Achieved: Yes    [name] 04/16/2021, 2:20 PM

## 2021-04-16 NOTE — Progress Notes (Signed)
PROGRESS NOTE   Subjective/Complaints:  Pt reports still has R knee pain, but not "screaming anymore"- trying to participate in therapy, but stops 1/2 way through reps- explained he needs to push through.   Cut "back on scratching"- not itching as much.   ROS:   Pt denies SOB, abd pain, CP, N/V/C/D, and vision changes   Objective:   No results found. No results for input(s): WBC, HGB, HCT, PLT in the last 72 hours.  No results for input(s): NA, K, CL, CO2, GLUCOSE, BUN, CREATININE, CALCIUM in the last 72 hours.   Intake/Output Summary (Last 24 hours) at 04/16/2021 1042 Last data filed at 04/16/2021 0700 Gross per 24 hour  Intake 360 ml  Output 625 ml  Net -265 ml        Physical Exam: Vital Signs Blood pressure 123/67, pulse 76, temperature 98.1 F (36.7 C), temperature source Oral, resp. rate 16, height 6' (1.829 m), weight 84.3 kg, SpO2 100 %.      General: awake, alert, appropriate, laying in bed; less irritable; NAD HENT: conjugate gaze; oropharynx moist CV: regular rate; no JVD Pulmonary: CTA B/L; no W/R/R- good air movement GI: soft, NT, ND, (+)BS Psychiatric: appropriate; brighter affect Neurological: [name]  Skin- not raised excoriation on arms, abd and torso- no signs of scabies or other bugs- VERY dry cracking skin- looks somewhat better and less scratching seen Musc: R knee baker's cyst significant Neuro: Alert Makes eye contact with examiner.   Follows commands.   Motor:  [name]: HF, KE 2+/5 (pain inhibition), improving [name]: 3/5 proximal to distal  Assessment/Plan: 1. Functional deficits which require 3+ hours per day of interdisciplinary therapy in a comprehensive inpatient rehab setting. Physiatrist is providing close team supervision and 24 hour management of active medical problems listed below. Physiatrist and rehab team continue to assess barriers to discharge/monitor patient progress  toward functional and medical goals  Care Tool:  Bathing    Body parts bathed by patient: Left upper leg, Right upper leg, Front perineal area, Left arm, Right arm, Face, Chest   Body parts bathed by helper: Right lower leg, Left lower leg Body parts n/a: Abdomen   Bathing assist Assist Level: Minimal Assistance - Patient > 75%     Upper Body Dressing/Undressing Upper body dressing   What is the patient wearing?: Pull over shirt    Upper body assist Assist Level: Set up assist    Lower Body Dressing/Undressing Lower body dressing      What is the patient wearing?: Pants, Incontinence brief     Lower body assist Assist for lower body dressing: Moderate Assistance - Patient 50 - 74%     Toileting Toileting Toileting Activity did not occur Landscape architect and hygiene only): N/A (no void or bm) (foley and ostomy)  Toileting assist Assist for toileting: Supervision/Verbal cueing (urinal)     Transfers Chair/bed transfer  Transfers assist  Chair/bed transfer activity did not occur: Safety/medical concerns (Fatigue, agitation, pain)  Chair/bed transfer assist level: Contact Guard/Touching assist     Locomotion Ambulation   Ambulation assist      Assist level: Contact Guard/Touching assist Assistive device: Walker-rolling Max distance: 28'  Walk 10 feet activity   Assist     Assist level: Contact Guard/Touching assist Assistive device: Walker-rolling   Walk 50 feet activity   Assist Walk 50 feet with 2 turns activity did not occur: Safety/medical concerns (Fatigue, agitation, pain)         Walk 150 feet activity   Assist Walk 150 feet activity did not occur: Safety/medical concerns (Fatigue, agitation, pain)         Walk 10 feet on uneven surface  activity   Assist Walk 10 feet on uneven surfaces activity did not occur: Safety/medical concerns (Fatigue, agitation, pain)         Wheelchair     Assist Is the patient using  a wheelchair?: Yes Type of Wheelchair: Manual    Wheelchair assist level: Supervision/Verbal cueing, Set up assist Max wheelchair distance: 150    Wheelchair 50 feet with 2 turns activity    Assist        Assist Level: Supervision/Verbal cueing   Wheelchair 150 feet activity     Assist      Assist Level: Supervision/Verbal cueing   Blood pressure 123/67, pulse 76, temperature 98.1 F (36.7 C), temperature source Oral, resp. rate 16, height 6' (1.829 m), weight 84.3 kg, SpO2 100 %.   Medical Problem List and Plan: 1.  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  Antithrombotics: -DVT/anticoagulation:  Pharmaceutical: Other (comment) Eliquis             -antiplatelet therapy: N/A 3. Pain: continue Lidoderm patch, Robaxin 1000 mg 4 times daily, oxycodone as needed 10/27- will do Steroid injection of R knee today  10/28- pain a little to somewhat better per pt- less swelling- con't regimen  10/29- pain more reasonable per pt- con't regimen 4. Mood: Ativan 1 mg every 6 hours as needed             -antipsychotic agents: N/A 5. Neuropsych: This patient is capable of making decisions on his own behalf. 6. Skin/Wound Care: Routine skin checks/colostomy care 7. Fluids/Electrolytes/Nutrition: Routine in and outs 8.  Postoperative anemia.     Hemoglobin 9.1 on 10/25 9.  Paroxysmal atrial fibrillation.  Continue Amiodarone 200 mg daily, Cardizem 60 mg every 6 hours.  Follow-up cardiology services  10/28- continues to see in RRR- con't regimen  Monitor with increased exertion 10.  VRE UTI.  Completed course of Zyvox 04/04/2021.  Contact precautions. 11.  AKI.  Resolved.   12.  Tremors.  Patient was on sotalol at home and currently on hold 13.  Hypertension.    Continue Lasix to 10 mg daily given frequent urination.   10/29- controlled BP_ con't regimen 14.  Right epididymitis.  Resolved.  Completed course of doxycycline.  Follow-up urology services as needed 15.  Left knee arthritis/effusion.  Responded well to steroid injection. 16.  History of alcohol tobacco abuse.  NicoDerm patch.  Provide counseling 17.  Anasarca.  Decreased nutritional storage.  Marinol added for appetite stimulant 18.  Tobacco abuse.  NicoDerm patch.  Counseling 19. Insomnia: start Rozarem HS. Resolved. 20. Suboptimal magnesium levels: d/c colace and started magnesium gluconate 250mg  HS.  21. Vitamin D deficiency: level 14 on 10/20. Ergocalciferol 50,000U once per week for 7 weeks ordered. 22. Inadequate oral intake: resolved. D/ced TPN. 23.  Hyponatremia  Sodium 130 on 10/24,  10/28- labs Monday 24. R knee pain-   10/27- will do steroid injection- very painful and limiting therapy-   10/28- doing better- con't regimen 25. New leukocytosis: Resolved  U/A and Cx and CXR neg 27. Skin rash  10/27- assessed with Linna Hoff and Anderson Malta- doesn't appear to be scabies- wil do prednisone 40 mg x 3 days and add menthol cream prn and eucerin BID.   10/28- itching better- might also need cortisone cream- will monitor  10/29- itching and scratching amount much bette-r less excoriation- is healing- con't regimen  R knee injection:done 10/27  steroid injection was performed at R knee- using 1% plain Lidocaine and 40mg  /1cc of Kenalog. This was well tolerated.  Cleaned with betadine x3 and allowed to dry- then alcohol then injected using 27 gauge 1.5 inch needle- no bleeding or complications.    Informed pt,  Lidocaine will kick in 15 minutes- and wear off tonight- the steroid will kick in tomorrow within 24 hours and take up to 72 hours to fully kick in.     LOS: 10 days A FACE TO FACE EVALUATION WAS PERFORMED  Travis Conrad 04/16/2021, 10:42 AM

## 2021-04-16 NOTE — Plan of Care (Signed)
  Problem: RH Comprehension Communication Goal: LTG Patient will comprehend basic/complex auditory (SLP) Description: LTG: Patient will comprehend basic/complex auditory information with cues (SLP). Outcome: Completed/Met   Problem: RH Problem Solving Goal: LTG Patient will demonstrate problem solving for (SLP) Description: LTG:  Patient will demonstrate problem solving for basic/complex daily situations with cues  (SLP) Outcome: Completed/Met   Problem: RH Memory Goal: LTG Patient will demonstrate ability for day to day (SLP) Description: LTG:   Patient will demonstrate ability for day to day recall/carryover during cognitive/linguistic activities with assist  (SLP) Outcome: Completed/Met Goal: LTG Patient will use memory compensatory aids to (SLP) Description: LTG:  Patient will use memory compensatory aids to recall biographical/new, daily complex information with cues (SLP) Outcome: Completed/Met   Problem: RH Attention Goal: LTG Patient will demonstrate this level of attention during functional activites (SLP) Description: LTG:  Patient will will demonstrate this level of attention during functional activites (SLP) Outcome: Completed/Met   Problem: RH Awareness Goal: LTG: Patient will demonstrate awareness during functional activites type of (SLP) Description: LTG: Patient will demonstrate awareness during functional activites type of (SLP) Outcome: Completed/Met

## 2021-04-17 DIAGNOSIS — K5901 Slow transit constipation: Secondary | ICD-10-CM

## 2021-04-17 LAB — GLUCOSE, CAPILLARY
Glucose-Capillary: 125 mg/dL — ABNORMAL HIGH (ref 70–99)
Glucose-Capillary: 116 mg/dL — ABNORMAL HIGH (ref 70–99)
Glucose-Capillary: 143 mg/dL — ABNORMAL HIGH (ref 70–99)

## 2021-04-17 MED ORDER — POLYETHYLENE GLYCOL 3350 17 G PO PACK
17.0000 g | PACK | Freq: Once | ORAL | Status: AC
  Administered 2021-04-17: 17 g via ORAL
  Filled 2021-04-17: qty 1

## 2021-04-17 MED ORDER — SENNOSIDES-DOCUSATE SODIUM 8.6-50 MG PO TABS
2.0000 | ORAL_TABLET | Freq: Every day | ORAL | Status: DC
  Administered 2021-04-17: 2 via ORAL
  Filled 2021-04-17: qty 2

## 2021-04-17 MED ORDER — SORBITOL 70 % SOLN
60.0000 mL | Freq: Once | Status: AC
  Administered 2021-04-17: 60 mL via ORAL
  Filled 2021-04-17: qty 60

## 2021-04-17 NOTE — Progress Notes (Signed)
Occupational Therapy Session Note  Patient Details  Name: CEASER EBELING MRN: [id number] Date of Birth: 07-30-1950  Today's Date: 04/18/2021 OT Individual Time: 1450-1517 OT Individual Time Calculation (min): 27 min   Skilled Therapeutic Interventions/Progress Updates:    Pt greeted in bed, spouse Jimmy at bedside. Per spouse, pt just soiled his brief. Has been having urinary urgency. Pt able to bridge to remove pants + soiled brief. He rolled Rt>Lt with CGA to meet pericare needs, pt able to complete frontal hygiene with setup. Noted a red spot on his bottom, nursing informed. OT placed small foam dressing over site, educated both pt and spouse on concerns for skin integrity/potential bed sore. Discussed risks of pressure injury and advised pt to spend some of his day in the sidelying position. Pt adamant that he did not want to be in sidelying, becoming agitated when OT strongly encouraged this. Informed nursing. He donned pants bedlevel with Max A to thread over both feet, able to pull pants up from thigh level over hips with supervision utilizing bridging and rolling. Setup for hand hygiene. Pt remained in bed at close of session, all needs within reach, bed alarm set, spouse still at bedside.   Therapy Documentation Precautions:  Precautions Precautions: Fall Precaution Comments: abdominal incision, watch HR and BP Restrictions Weight Bearing Restrictions: No  Pain: Rt knee pain, pt medicated with topical analgesic   ADL: ADL Eating: Set up Grooming: Setup Where Assessed-Grooming: Wheelchair, Sitting at sink Upper Body Bathing: Setup Where Assessed-Upper Body Bathing: Sitting at sink Lower Body Bathing: Supervision/safety Where Assessed-Lower Body Bathing: Sitting at sink Upper Body Dressing: Setup Where Assessed-Upper Body Dressing: Wheelchair Lower Body Dressing: Minimal assistance Where Assessed-Lower Body Dressing: Wheelchair Toileting: Other (Comment) (pt does not use  a toilet due to ostomy and he only want to use the urinal) Where Assessed-Toileting: Other (Comment) (N/A) Toilet Transfer:  (Pt not toileting, but he can transfer to one of he chooses to urinate sitting on toilet) Toilet Transfer Method: Ambulating, Stand pivot (RW) Science writer: Bedside commode Tub/Shower Transfer: Unable to assess (safety/medical) ADL Comments:  (some ADLs not assessed d/t incision and ostomy)  Therapy/Group: Individual Therapy  Maudie Shingledecker A Dennice Tindol 04/18/2021, 3:40 PM

## 2021-04-17 NOTE — Progress Notes (Signed)
Slept after midnight. Colostomy bag emptied and changed X2 as patient complained of gas leaks. Was very ill-mannered verbally. Reoriented to be appropriate. Unwilling to be part of colostomy education. Claims wife knows how to do it.All medications served. Safety maintained.[name]

## 2021-04-17 NOTE — Progress Notes (Signed)
PROGRESS NOTE   Subjective/Complaints:  Pt reports stool too firm- blew off colostomy- blew seal- stool so firm-   Will increase bowel meds Pain and itching about the same.    ROS:   Pt denies SOB, abd pain, CP, N/V/ (+) C/D, and vision changes   Objective:   No results found. No results for input(s): WBC, HGB, HCT, PLT in the last 72 hours.  No results for input(s): NA, K, CL, CO2, GLUCOSE, BUN, CREATININE, CALCIUM in the last 72 hours.   Intake/Output Summary (Last 24 hours) at 04/17/2021 1458 Last data filed at 04/17/2021 1300 Gross per 24 hour  Intake 420 ml  Output 200 ml  Net 220 ml        Physical Exam: Vital Signs Blood pressure 121/68, pulse 69, temperature 97.8 F (36.6 C), resp. rate 18, height 6' (1.829 m), weight 84.5 kg, SpO2 98 %.      General: awake, alert, appropriate, irritable; not making eye contact; NAD HENT: conjugate gaze; oropharynx moist CV: regular rate; no JVD Pulmonary: CTA B/L; no W/R/R- good air movement GI: soft, NT: wound C/D/I; ND; (+) colostomy- firm stool!  and hypoactive BS-  Psychiatric: appropriate- irritable- not polite to staff Neurological: alert-  Skin- not raised excoriation on arms, abd and torso- no signs of scabies or other bugs- VERY dry cracking skin- looks somewhat better and less scratching seen Musc: R knee baker's cyst significant Neuro: Alert Makes eye contact with examiner.   Follows commands.   Motor:  [name]: HF, KE 2+/5 (pain inhibition), improving [name]: 3/5 proximal to distal  Assessment/Plan: 1. Functional deficits which require 3+ hours per day of interdisciplinary therapy in a comprehensive inpatient rehab setting. Physiatrist is providing close team supervision and 24 hour management of active medical problems listed below. Physiatrist and rehab team continue to assess barriers to discharge/monitor patient progress toward functional and  medical goals  Care Tool:  Bathing    Body parts bathed by patient: Left upper leg, Right upper leg, Front perineal area, Left arm, Right arm, Face, Chest   Body parts bathed by helper: Right lower leg, Left lower leg Body parts n/a: Abdomen   Bathing assist Assist Level: Minimal Assistance - Patient > 75%     Upper Body Dressing/Undressing Upper body dressing   What is the patient wearing?: Pull over shirt    Upper body assist Assist Level: Set up assist    Lower Body Dressing/Undressing Lower body dressing      What is the patient wearing?: Pants, Incontinence brief     Lower body assist Assist for lower body dressing: Moderate Assistance - Patient 50 - 74%     Toileting Toileting Toileting Activity did not occur Landscape architect and hygiene only): N/A (no void or bm) (foley and ostomy)  Toileting assist Assist for toileting: Supervision/Verbal cueing (urinal)     Transfers Chair/bed transfer  Transfers assist  Chair/bed transfer activity did not occur: Safety/medical concerns (Fatigue, agitation, pain)  Chair/bed transfer assist level: Contact Guard/Touching assist     Locomotion Ambulation   Ambulation assist      Assist level: Contact Guard/Touching assist Assistive device: Walker-rolling Max distance: 55'  Walk 10 feet activity   Assist     Assist level: Contact Guard/Touching assist Assistive device: Walker-rolling   Walk 50 feet activity   Assist Walk 50 feet with 2 turns activity did not occur: Safety/medical concerns (Fatigue, agitation, pain)         Walk 150 feet activity   Assist Walk 150 feet activity did not occur: Safety/medical concerns (Fatigue, agitation, pain)         Walk 10 feet on uneven surface  activity   Assist Walk 10 feet on uneven surfaces activity did not occur: Safety/medical concerns (Fatigue, agitation, pain)         Wheelchair     Assist Is the patient using a wheelchair?:  Yes Type of Wheelchair: Manual    Wheelchair assist level: Supervision/Verbal cueing, Set up assist Max wheelchair distance: 150    Wheelchair 50 feet with 2 turns activity    Assist        Assist Level: Supervision/Verbal cueing   Wheelchair 150 feet activity     Assist      Assist Level: Supervision/Verbal cueing   Blood pressure 121/68, pulse 69, temperature 97.8 F (36.6 C), resp. rate 18, height 6' (1.829 m), weight 84.5 kg, SpO2 98 %.   Medical Problem List and Plan: 1.  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  2.  Antithrombotics: -DVT/anticoagulation:  Pharmaceutical: Other (comment) Eliquis             -antiplatelet therapy: N/A 3. Pain: continue Lidoderm patch, Robaxin 1000 mg 4 times daily, oxycodone as needed 10/27- will do Steroid injection of R knee today  10/28- pain a little to somewhat better per pt- less swelling- con't regimen  10/29- pain more reasonable per pt- con't regimen 4. Mood: Ativan 1 mg every 6 hours as needed             -antipsychotic agents: N/A 5. Neuropsych: This patient is capable of making decisions on his own behalf. 6. Skin/Wound Care: Routine skin checks/colostomy care  10/30- pt refusing to do ANY colostomy care- will increase bowel meds and give Sorbitol x 60cc today and extra dose of miralax- since blew seal, was so constipated.  7. Fluids/Electrolytes/Nutrition: Routine in and outs 8.  Postoperative anemia.     Hemoglobin 9.1 on 10/25  10/30- labs iN AM 9.  Paroxysmal atrial fibrillation.  Continue Amiodarone 200 mg daily, Cardizem 60 mg every 6 hours.  Follow-up cardiology services  10/28- continues to see in RRR- con't regimen  Monitor with increased exertion 10.  VRE UTI.  Completed  course of Zyvox 04/04/2021.  Contact precautions. 11.  AKI.  Resolved.   12.  Tremors.  Patient was on sotalol at home and currently on hold 13.  Hypertension.   Continue Lasix to 10 mg daily given frequent urination.   10/30- BP controlled- con't regimen 14.  Right epididymitis.  Resolved.  Completed course of doxycycline.  Follow-up urology services as needed 15.  Left knee arthritis/effusion.  Responded well to steroid injection. 16.  History of alcohol tobacco abuse.  NicoDerm patch.  Provide counseling 17.  Anasarca.  Decreased nutritional storage.  Marinol added for appetite stimulant 18.  Tobacco abuse.  NicoDerm patch.  Counseling 19. Insomnia: start Rozarem HS. Resolved. 20. Suboptimal magnesium levels: d/c colace and started magnesium  gluconate 250mg  HS.  21. Vitamin D deficiency: level 14 on 10/20. Ergocalciferol 50,000U once per week for 7 weeks ordered. 22. Inadequate oral intake: resolved. D/ced TPN. 23.  Hyponatremia  Sodium 130 on 10/24,   10/28- labs Monday 24. R knee pain-   10/27- will do steroid injection- very painful and limiting therapy-   10/28- doing better- con't regimen 25. New leukocytosis: Resolved  U/A and Cx and CXR neg 27. Skin rash  10/27- assessed with Linna Hoff and Anderson Malta- doesn't appear to be scabies- wil do prednisone 40 mg x 3 days and add menthol cream prn and eucerin BID.   10/28- itching better- might also need cortisone cream- will monitor  10/29- itching and scratching amount much bette-r less excoriation- is healing- con't regimen  R knee injection:done 10/27  steroid injection was performed at R knee- using 1% plain Lidocaine and 40mg  /1cc of Kenalog. This was well tolerated.  Cleaned with betadine x3 and allowed to dry- then alcohol then injected using 27 gauge 1.5 inch needle- no bleeding or complications.    Informed pt,  Lidocaine will kick in 15 minutes- and wear off tonight- the steroid will kick in tomorrow within 24 hours and take  up to 72 hours to fully kick in.     LOS: 11 days A FACE TO FACE EVALUATION WAS PERFORMED  Koah Chisenhall 04/17/2021, 2:58 PM

## 2021-04-17 NOTE — Progress Notes (Signed)
Occupational Therapy Note  Patient Details  Name: Travis Conrad MRN: [id number] Date of Birth: [birth year]  Today's Date: 04/17/2021 OT Missed Time: 6 Minutes Missed Time Reason: Nursing care  Pt in bed upon arrival with wife present. Pt's wife removed sheet off pt and discovered the colostomy bag had not been properly sealed. Pt soiled and RN notified. Pt missed 45 mins skilled OT services 2/2 nursing care for colostomy care. Will check back as able.    Leotis Shames [facility] 04/17/2021, 10:35 AM

## 2021-04-18 LAB — COMPREHENSIVE METABOLIC PANEL
ALT: 49 U/L — ABNORMAL HIGH (ref 0–44)
AST: 35 U/L (ref 15–41)
Albumin: 2.6 g/dL — ABNORMAL LOW (ref 3.5–5.0)
Alkaline Phosphatase: 86 U/L (ref 38–126)
Anion gap: 7 (ref 5–15)
BUN: 25 mg/dL — ABNORMAL HIGH (ref 8–23)
CO2: 28 mmol/L (ref 22–32)
Calcium: 8.7 mg/dL — ABNORMAL LOW (ref 8.9–10.3)
Chloride: 96 mmol/L — ABNORMAL LOW (ref 98–111)
Creatinine, Ser: 1.05 mg/dL (ref 0.61–1.24)
GFR, Estimated: >60 mL/min (ref >60)
Glucose, Bld: 121 mg/dL — ABNORMAL HIGH (ref 70–99)
Potassium: 3.5 mmol/L (ref 3.5–5.1)
Sodium: 131 mmol/L — ABNORMAL LOW (ref 135–145)
Total Bilirubin: 0.4 mg/dL (ref 0.3–1.2)
Total Protein: 6.4 g/dL — ABNORMAL LOW (ref 6.5–8.1)

## 2021-04-18 LAB — CBC WITH DIFFERENTIAL/PLATELET
Abs Immature Granulocytes: 0.11 10*3/uL — ABNORMAL HIGH (ref 0.00–0.07)
Basophils Absolute: 0 10*3/uL (ref 0.0–0.1)
Basophils Relative: 0 %
Eosinophils Absolute: 0 10*3/uL (ref 0.0–0.5)
Eosinophils Relative: 0 %
HCT: 33 % — ABNORMAL LOW (ref 39.0–52.0)
Hemoglobin: 10.3 g/dL — ABNORMAL LOW (ref 13.0–17.0)
Immature Granulocytes: 1 %
Lymphocytes Relative: 32 %
Lymphs Abs: 3.7 10*3/uL (ref 0.7–4.0)
MCH: 27.8 pg (ref 26.0–34.0)
MCHC: 31.2 g/dL (ref 30.0–36.0)
MCV: 88.9 fL (ref 80.0–100.0)
Monocytes Absolute: 1.2 10*3/uL — ABNORMAL HIGH (ref 0.1–1.0)
Monocytes Relative: 10 %
Neutro Abs: 6.9 10*3/uL (ref 1.7–7.7)
Neutrophils Relative %: 57 %
Platelets: 606 10*3/uL — ABNORMAL HIGH (ref 150–400)
RBC: 3.71 MIL/uL — ABNORMAL LOW (ref 4.22–5.81)
RDW: 15.4 % (ref 11.5–15.5)
WBC: 11.9 10*3/uL — ABNORMAL HIGH (ref 4.0–10.5)
nRBC: 0 % (ref 0.0–0.2)

## 2021-04-18 LAB — GLUCOSE, CAPILLARY
Glucose-Capillary: 123 mg/dL — ABNORMAL HIGH (ref 70–99)
Glucose-Capillary: 128 mg/dL — ABNORMAL HIGH (ref 70–99)
Glucose-Capillary: 103 mg/dL — ABNORMAL HIGH (ref 70–99)
Glucose-Capillary: 118 mg/dL — ABNORMAL HIGH (ref 70–99)

## 2021-04-18 MED ORDER — NICOTINE 14 MG/24HR TD PT24
14.0000 mg | MEDICATED_PATCH | Freq: Every day | TRANSDERMAL | Status: DC
  Administered 2021-04-18 – 2021-04-21 (×4): 14 mg via TRANSDERMAL
  Filled 2021-04-18 (×4): qty 1

## 2021-04-18 MED ORDER — SENNOSIDES-DOCUSATE SODIUM 8.6-50 MG PO TABS
2.0000 | ORAL_TABLET | Freq: Two times a day (BID) | ORAL | Status: DC
  Administered 2021-04-18 – 2021-04-21 (×5): 2 via ORAL
  Filled 2021-04-18 (×6): qty 2

## 2021-04-18 NOTE — Progress Notes (Addendum)
PROGRESS NOTE   Subjective/Complaints:  +BM thru ostomy. C/o pain in right knee which is limiting. Had knee injection a few days ago  ROS: Patient denies fever, rash, sore throat, blurred vision, nausea, vomiting, diarrhea, cough, shortness of breath or chest pain,  headache, or mood change.    Objective:   No results found. Recent Labs    04/18/21 0618  WBC 11.9*  HGB 10.3*  HCT 33.0*  PLT 606*    Recent Labs    04/18/21 0618  NA 131*  K 3.5  CL 96*  CO2 28  GLUCOSE 121*  BUN 25*  CREATININE 1.05  CALCIUM 8.7*     Intake/Output Summary (Last 24 hours) at 04/18/2021 1230 Last data filed at 04/18/2021 0600 Gross per 24 hour  Intake 480 ml  Output 1050 ml  Net -570 ml        Physical Exam: Vital Signs Blood pressure 116/65, pulse 61, temperature 98 F (36.7 C), resp. rate 18, height 6' (1.829 m), weight 84.5 kg, SpO2 99 %.      Constitutional: No distress . Vital signs reviewed. HEENT: NCAT, EOMI, oral membranes moist Neck: supple Cardiovascular: RRR without murmur. No JVD    Respiratory/Chest: CTA Bilaterally without wheezes or rales. Normal effort    GI/Abdomen: BS +, non-tender, non-distended Ext: no clubbing, cyanosis, or edema Psych: pleasant, anxious Skin- not raised excoriation on arms, abd and torso- no signs of scabies or other bugs- VERY dry cracking skin- looks somewhat better and less scratching seen Musc: R knee baker's cyst significant Neuro: Alert Makes eye contact with examiner.   Follows commands.   Motor:  [name]: HF, KE 2+/5 still limited by pain [name]: 3/5 proximal to distal  Assessment/Plan: 1. Functional deficits which require 3+ hours per day of interdisciplinary therapy in a comprehensive inpatient rehab setting. Physiatrist is providing close team supervision and 24 hour management of active medical problems listed below. Physiatrist and rehab team continue to assess  barriers to discharge/monitor patient progress toward functional and medical goals  Care Tool:  Bathing    Body parts bathed by patient: Left upper leg, Right upper leg, Front perineal area, Left arm, Right arm, Face, Chest, Buttocks, Right lower leg, Left lower leg (except for feet)   Body parts bathed by helper: Right lower leg, Left lower leg Body parts n/a: Abdomen   Bathing assist Assist Level: Supervision/Verbal cueing     Upper Body Dressing/Undressing Upper body dressing   What is the patient wearing?: Pull over shirt    Upper body assist Assist Level: Set up assist    Lower Body Dressing/Undressing Lower body dressing      What is the patient wearing?: Pants, Incontinence brief     Lower body assist Assist for lower body dressing: Supervision/Verbal cueing     Toileting Toileting Toileting Activity did not occur (Clothing management and hygiene only): N/A (no void or bm) (foley and ostomy)  Toileting assist Assist for toileting: Supervision/Verbal cueing (urinal)     Transfers Chair/bed transfer  Transfers assist  Chair/bed transfer activity did not occur: Safety/medical concerns (Fatigue, agitation, pain)  Chair/bed transfer assist level: Supervision/Verbal cueing  Locomotion Ambulation   Ambulation assist      Assist level: Contact Guard/Touching assist Assistive device: Walker-rolling Max distance: 40'   Walk 10 feet activity   Assist     Assist level: Contact Guard/Touching assist Assistive device: Walker-rolling   Walk 50 feet activity   Assist Walk 50 feet with 2 turns activity did not occur: Safety/medical concerns (Fatigue, agitation, pain)         Walk 150 feet activity   Assist Walk 150 feet activity did not occur: Safety/medical concerns (Fatigue, agitation, pain)         Walk 10 feet on uneven surface  activity   Assist Walk 10 feet on uneven surfaces activity did not occur: Safety/medical concerns  (Fatigue, agitation, pain)         Wheelchair     Assist Is the patient using a wheelchair?: Yes Type of Wheelchair: Manual    Wheelchair assist level: Supervision/Verbal cueing, Set up assist Max wheelchair distance: 150    Wheelchair 50 feet with 2 turns activity    Assist        Assist Level: Supervision/Verbal cueing   Wheelchair 150 feet activity     Assist      Assist Level: Supervision/Verbal cueing   Blood pressure 116/65, pulse 61, temperature 98 F (36.7 C), resp. rate 18, height 6' (1.829 m), weight 84.5 kg, SpO2 99 %.   Medical Problem List and Plan: 1.  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  Antithrombotics: -DVT/anticoagulation:  Pharmaceutical: Other (comment) Eliquis             -antiplatelet therapy: N/A 3. Pain: continue Lidoderm patch, Robaxin 1000 mg 4 times daily, oxycodone as needed    10/31- pain in right knee still limiting 4. Mood: Ativan 1 mg every 6 hours as needed             -antipsychotic agents: N/A 5. Neuropsych: This patient is capable of making decisions on his own behalf. 6. Skin/Wound Care: Routine skin checks/colostomy care  10/30- pt refusing to do ANY colostomy care- will increase bowel meds and give Sorbitol x 60cc today and extra dose of miralax- since blew seal, was so constipated.   10/31/ large stool thru colostomy   -increase senna-s to bid, continue mg++ 7. Fluids/Electrolytes/Nutrition:   8.  Postoperative anemia.     Hemoglobin 9.1 on 10/25  10/31 10.3 9.  Paroxysmal atrial fibrillation.  Continue Amiodarone 200 mg daily, Cardizem 60 mg every 6 hours.  Follow-up cardiology services  In reg rhythm 10.  VRE UTI.  Completed course of Zyvox 04/04/2021.  Contact precautions. 11.  AKI.   BUN up, encourage fluids.   12.  Tremors.  Patient was on sotalol at home and currently on hold 13.  Hypertension.   Continue Lasix to 10 mg daily given frequent urination.   10/31- BP controlled- con't regimen 14.  Right epididymitis.  Resolved.  Completed course of doxycycline.  Follow-up urology services as needed 15.  Left knee arthritis/effusion.  Responded well to steroid injection. 16.  History of alcohol tobacco abuse.  NicoDerm patch.  Provide counseling 17.  Anasarca.  Decreased nutritional storage.  Marinol added for appetite stimulant 18.  Tobacco abuse.  NicoDerm patch.  Counseling 19. Insomnia: start Rozarem HS. Resolved. 20. Suboptimal magnesium levels: d/c colace and  started magnesium gluconate 250mg  HS.  21. Vitamin D deficiency: level 14 on 10/20. Ergocalciferol 50,000U once per week for 7 weeks ordered. 22. Inadequate oral intake: resolved. D/ced TPN. 23.  Hyponatremia  Sodium 130 on 10/24,   10/31 131 24. R knee pain-   10/27-  steroid injection-with benefit although pt says knee bothering him again today 10/31 25. New leukocytosis: Resolved  U/A and Cx and CXR neg 27. Skin rash  10/27- assessed with Linna Hoff and Anderson Malta- doesn't appear to be scabies- wil do prednisone 40 mg x 3 days and add menthol cream prn and eucerin BID.   10/28- itching better- might also need cortisone cream- will monitor  10/29- itching and scratching amount much bette-r less excoriation- is healing- con't regimen        LOS: 12 days A FACE TO [place] 04/18/2021, 12:30 PM

## 2021-04-18 NOTE — Progress Notes (Signed)
Physical Therapy Session Note  Patient Details  Name: Travis Conrad MRN: [id number] Date of Birth: 1951-04-13  Today's Date: 04/18/2021 PT Individual Time: 6659-9357 PT Individual Time Calculation (min): 61 min   Short Term Goals: Week 1:  PT Short Term Goal 1 (Week 1): STG = LTG due to ELOS PT Short Term Goal 1 - Progress (Week 1): Progressing toward goal Week 2:  PT Short Term Goal 1 (Week 2): STG = LTG due to LOS  Skilled Therapeutic Interventions/Progress Updates:  Pt received supine in bed, wife present. Pt reported urgency to void, informed pt to use bedside urinal. Pt reported increase in urinary urgency today, which is not baseline. Nursing notified. Pt performed bed mobility mod I and sit <>stand w/S* to RW. Stand pivot w/RW and S* to [facility] and pt self-propelled >200' w/S* to ortho gym, required few rest breaks due to fatigue. Pt encouraged to continue self-propelling when he goes home to improve cardiovascular endurance and UE strength, pt replied that his "wife would do everything for him and he will not stop her". Therapist continued to educate pt on benefits of exercise and improving independence, pt replied with "I know all this".   Pt performed car transfer w/S* and RW. Demonstrated ease getting into/out of car without pain in knee, min verbal cues for hand placement while getting out of car. Pt then self-propelled up and down ramp w/min A to avoid hitting rail on side. Min verbal cues to slow down WC while descending to reduce risk of tipping over. Pt performed sit <>stand and ascended/descended ramp w/RW and S* easily, pt surprised at his level of performance but reported increase in R knee pain and extreme urgency to void. Informed pt he had voided less than an hour prior, pt adamant that he needed to void again. Pt self-propelled >200' back to room w/S* and performed sit <>stand pivot from [facility] to bed w/S*. Supine <>sit mod I and pt voided very little in urinal. Noted his briefs  were very wet, nursing notified of situation. Pt was left supine in bed, handoff w/OT, all needs in reach.   Therapy Documentation Precautions:  Precautions Precautions: Fall Precaution Comments: abdominal incision, watch HR and BP Restrictions Weight Bearing Restrictions: No   Therapy/Group: Individual Therapy Cruzita Lederer Peachie Barkalow, PT, DPT  04/18/2021, 7:55 AM

## 2021-04-18 NOTE — Progress Notes (Addendum)
Inpatient Rehabilitation Discharge Medication Review by a Pharmacist  A complete drug regimen review was completed for this patient to identify any potential clinically significant medication issues.  High Risk Drug Classes Is patient taking? Indication by Medication  Antipsychotic No   Anticoagulant Yes Eliquis for Afib  Antibiotic No   Opioid Yes Oxycodone for pain  Antiplatelet No   Hypoglycemics/insulin No   Vasoactive Medication Yes Amiodarone / Diltiazem for Afib  Chemotherapy No   Other No      Type of Medication Issue Identified Description of Issue Recommendation(s)  Drug Interaction(s) (clinically significant)     Duplicate Therapy     Allergy     No Medication Administration End Date     Incorrect Dose     Additional Drug Therapy Needed     Significant med changes from prior encounter (inform family/care partners about these prior to discharge).    Other       Clinically significant medication issues were identified that warrant physician communication and completion of prescribed/recommended actions by midnight of the next day:  No  Pharmacist comments: Consider resuming Crestor 20 mg daily eventually  Time spent performing this drug regimen review (minutes):  20 minutes   Tad Moore 04/18/2021 8:38 AM

## 2021-04-18 NOTE — Progress Notes (Addendum)
Occupational Therapy Session Note  Patient Details  Name: Travis Conrad MRN: [id number] Date of Birth: 04-20-1951  Today's Date: 04/18/2021 OT Individual Time: 0930-1015 OT Individual Time Calculation (min): 45 min    Short Term Goals: Week 2:  OT Short Term Goal 1 (Week 2): STGs = LTGs (LB dressing downgraded to min A)  Skilled Therapeutic Interventions/Progress Updates:    Pt received in bed and stated, "my knee hurts worse today! I cant do much therapy" Reminded pt these are his last TWO days in rehab and he goes home on Wednesday so we need to be sure he can move safely with no lifting A as his wife should not be lifting him Pt grumbled a bit but was willing to participate. Once he got started, he actually needed very few cues and moved spontaneously more easily without focusing on his knee pain.  He sat to EOB independently, stood to RW with S, stepped 4 feet to wc with RW with S.  Pt placed at sink to engage in bathing at sink level. He would often say "oh I cant stand up" I would gently remind him he could and then he would do it!  Supervision with standing to wash his bottom and front perineal area and to manage clothing over hips.  Pt continues to need A with his socks but does not like the sock aid.   Pt sat in wc to engage in UE strengthening with use of blue theraband working on single and double arm rows for UB strength and AROM of shoulders using sh circles with arms forward and out to sides. He then ambulated 6 feet to sit on EOB and got back into bed with S only. Bed alarm set and all needs met.   Therapy Documentation Precautions:  Precautions Precautions: Fall Precaution Comments: abdominal incision, watch HR and BP Restrictions Weight Bearing Restrictions: No    Pain: Pain Assessment Pain Scale: 0-10 Pain Score: 7  Pain Type: Chronic pain Pain Location: Knee Pain Orientation: Right Pain Descriptors / Indicators: Aching Pain Frequency: Intermittent Pain  Onset: On-going Pain Intervention(s): Medication (See eMAR) ADL: ADL Eating: Set up Grooming: Setup Where Assessed-Grooming: Wheelchair, Sitting at sink Upper Body Bathing: Setup Where Assessed-Upper Body Bathing: Sitting at sink Lower Body Bathing: Supervision/safety Where Assessed-Lower Body Bathing: Sitting at sink Upper Body Dressing: Setup Where Assessed-Upper Body Dressing: Wheelchair Lower Body Dressing: Minimal assistance Where Assessed-Lower Body Dressing: Wheelchair Toileting: Other (Comment) (pt does not use a toilet due to ostomy and he only want to use the urinal) Where Assessed-Toileting: Other (Comment) (N/A) Toilet Transfer:  (Pt not toileting, but he can transfer to one of he chooses to urinate sitting on toilet) Toilet Transfer Method: Ambulating, Stand pivot (RW) Science writer: Bedside commode Tub/Shower Transfer: Unable to assess (safety/medical) ADL Comments:  (some ADLs not assessed d/t incision and ostomy)   Therapy/Group: Individual Therapy  [place] 04/18/2021, 11:35 AM

## 2021-04-18 NOTE — Progress Notes (Signed)
Abdominal wound care teaching performed with patient's spouse.  Spouse appears comfortable and knowledgeable with wound care.  Demonstration and teach back performed by spouse.and this RN. Wound appears pink and clean. No drainage or odor noted.

## 2021-04-18 NOTE — Progress Notes (Signed)
Physical Therapy Session Note  Patient Details  Name: Travis Conrad MRN: [id number] Date of Birth: 1950-11-16  Today's Date: 04/18/2021 PT Individual Time: 6553-7482 PT Individual Time Calculation (min): 42 min   Short Term Goals: Week 1:  PT Short Term Goal 1 (Week 1): STG = LTG due to ELOS PT Short Term Goal 1 - Progress (Week 1): Progressing toward goal Week 2:  PT Short Term Goal 1 (Week 2): STG = LTG due to LOS  Skilled Therapeutic Interventions/Progress Updates:    Patient received sitting up in bed, agreeable to PT. He initially denied pain, but then reported pain in R knee, up to 8/10. PT alerting RN for pain rx, provided rest breaks, distractions and repositioning to assist with pain management. He was able to come sit edge of bed with supervision. He reported feeling "funny and dizzy." BP 115/60. Patient standing with RW and CGA, BP 115/94. He was agreeable to continue with therapy. Transferring to wc via stand pivot with RW and CGA. Patient completing AM ADLs with set up assist. Easily frustrated when not able to see where his toothbrush/toothpaste was. PT transporting patient in wc to therapy gym for time management and energy conservation. Patient ambulated ~36ft with RW and CGA before reporting increase in R knee pain and requesting to finish session seated. UE therex with 4# dowel: bicep curls, chest press, shoulder press, trunk rotation 3x20 each. Patient returning to room in wc, ambulatory transfer to bed with RW and CGA. Bed alarm on, call light within reach.    Therapy Documentation Precautions:  Precautions Precautions: Fall Precaution Comments: abdominal incision, watch HR and BP Restrictions Weight Bearing Restrictions: No   Therapy/Group: Individual Therapy  Karoline Caldwell, PT, DPT, CBIS  04/18/2021, 7:53 AM

## 2021-04-18 NOTE — Progress Notes (Signed)
Nutrition Follow-up  DOCUMENTATION CODES:   Not applicable  INTERVENTION:  - Continue Ensure Enlive po TID, each supplement provides 350 kcal and 20 grams of protein  - Afternoon and evening snack to encourage adequate intake  - Discontinue 30 ml ProSource Plus TID  NUTRITION DIAGNOSIS:   Increased nutrient needs related to post-op healing as evidenced by estimated needs.  On-going  GOAL:   Patient will meet greater than or equal to 90% of their needs  Progressing, meeting needs through PO intake and nutrition supplements  MONITOR:   PO intake, Supplement acceptance, Labs, Weight trends, Skin, I & O's  REASON FOR ASSESSMENT:    (TPN)    ASSESSMENT:   70 year old male with history of PAF, hyperlipidemia, hypertension, tobacco/alcohol use, benign essential tremors, prior diverticulitis. Presented with increasing abdominal pain nausea and vomiting times several days. CT of abdomen pelvis showed inflammation of the sigmoid colon and a small amount of extraluminal gas, consistent with perforated diverticulitis and abscess. Aspiration and drain placement of intra-abdominal fluid collection and underwent exploratory laparotomy, sigmoid colectomy with end colostomy creation drainage of intra-abdominal abscess. TPN initiated 9/09.  Patient is currently tolerating a soft diet. Therapy evaluations completed due to patient decreased functional mobility was admitted for a comprehensive rehab program.  Pt reports eating 33% of meals. Noted documented meal completions with sporadic intake x3 days. He does not enjoy the taste or texture of the foods he is receiving. His wife states that his taste buds change from meal to meal. Observed multiple Prousource packets on bedside table. Pt has not been taking them however agrees to try snacks in the afternoon and evening. At home he usually eats smaller meals throughout the day and thinks that he may do better if intake is spread out throughout the  day.   Medications: marinol, folvite, SSI, magonate, MVI with minerals, protonix, senokot, thiamine, Vitamin D  Labs: sodium 131, BUN 25, ALT 49  Diet Order:   Diet Order             DIET SOFT Room service appropriate? No; Fluid consistency: Thin  Diet effective now                   EDUCATION NEEDS:   Not appropriate for education at this time  Skin:  Skin Assessment: Skin Integrity Issues: Skin Integrity Issues:: Incisions Incisions: abdomen  Last BM:  04/17/21 colostomy  Height:   Ht Readings from Last 1 Encounters:  04/06/21 6' (1.829 m)    Weight:   Wt Readings from Last 1 Encounters:  04/17/21 84.5 kg    BMI:  Body mass index is 25.27 kg/m.  Estimated Nutritional Needs:   Kcal:  2100-2300  Protein:  105-115 grams  Fluid:  >/= 2 L/day  Clayborne Dana, RDN, LDN Clinical Nutrition

## 2021-04-18 NOTE — Discharge Summary (Signed)
Physician Discharge Summary  Patient ID: Travis Conrad MRN: [id number] DOB/AGE: 24-Mar-1951 70 y.o.  Admit date: 04/06/2021 Discharge date: 04/21/2021  Discharge Diagnoses:  Principal Problem:   Sepsis [facility]) Active Problems:   Hyponatremia   Acute blood loss anemia   Benign essential HTN   Adjustment disorder with mixed anxiety and depressed mood PAF VRE UTI Right epididymitis Left knee arthritis/effusion History of alcohol tobacco abuse Decreased nutritional storage Skin rash Right knee effusion  Discharged Condition: Stable  Significant Diagnostic Studies: DG Knee 1-2 Views Right  Result Date: 04/19/2021 CLINICAL DATA:  Lateral knee pain over the last few days. No specific injury. EXAM: RIGHT KNEE - 1-2 VIEW COMPARISON:  None. FINDINGS: Moderate knee joint effusion. No weight-bearing compartment joint space narrowing or osteophyte formation. Patellofemoral compartment appears unremarkable. No focal bone lesion. Regional arterial calcification is noted. IMPRESSION: Joint effusion. No evidence of joint space narrowing, osteophyte formation or focal lesion otherwise. Regional arterial calcification. Electronically Signed   By: Nelson Chimes M.D.   On: 04/19/2021 14:12   CT ABDOMEN PELVIS W CONTRAST  Result Date: 03/22/2021 CLINICAL DATA:  Abdominal infection suspected. EXAM: CT ABDOMEN AND PELVIS WITH CONTRAST TECHNIQUE: Multidetector CT imaging of the abdomen and pelvis was performed using the standard protocol following bolus administration of intravenous contrast. CONTRAST:  173mL OMNIPAQUE IOHEXOL 350 MG/ML SOLN COMPARISON:  CT abdomen and pelvis 03/04/2021. FINDINGS: Lower chest: Moderate right and small left pleural effusions are present, increased from prior. There is compressive atelectasis in the bilateral lower lobes. Hepatobiliary: No focal liver abnormality is seen. No gallstones, gallbladder wall thickening, or biliary dilatation. Pancreas: Unremarkable. No pancreatic  ductal dilatation or surrounding inflammatory changes. Spleen: Normal in size without focal abnormality. Adrenals/Urinary Tract: Subcentimeter left adrenal nodule is unchanged. Right adrenal gland within normal limits. There is mild nonspecific bilateral perinephric fat stranding which appears similar to the prior examination. There is no hydronephrosis. Bladder is within normal limits. Stomach/Bowel: Tip of nasogastric tube is in the stomach. Air contrast level is noted in the stomach. There is some distended small bowel loops proximally measuring up to 4 cm. These have decreased in size when compared to the prior examination. Ileal loops are decompressed. No definite transition point visualized. Colon is nondilated. There is colonic diverticulosis without evidence for diverticulitis. Left lower quadrant colostomy appears within normal limits. The appendix is within normal limits. There is also some contrast within rectal stump without extravasation. Vascular/Lymphatic: Aortic atherosclerosis. No enlarged abdominal or pelvic lymph nodes. Reproductive: Prostate gland is enlarged, unchanged from prior examination. Other: Left ileus psoas heterogeneity and enlargement is again seen. Size is unchanged, but there is no longer fluid fluid level identified. Underlying lesion would be could difficult to exclude. There is no ascites or free air. No pelvic fluid collection identified. Percutaneous drainage catheter has been removed. There is no focal abdominal wall hernia. Diffuse body wall edema is unchanged. Musculoskeletal: Lipoma in the anterior right thigh musculature is unchanged. Degenerative changes affect the spine. IMPRESSION: 1. Left iliopsoas enlargement heterogeneity persists, with fluid fluid level is no longer identified. Findings may represent intramuscular hematoma, infection or mass. 2. Prominent proximal small bowel without definite transition point favored as ileus. Degree of small-bowel distention has  decreased. Partial small bowel obstruction cannot be entirely excluded. 3. No pelvic fluid collection. Percutaneous drainage catheter has been removed. 4. Left lower quadrant colostomy appears uncomplicated. 5. Bilateral pleural effusions have increased. 6. Stable body wall edema. 7.  Aortic Atherosclerosis (ICD10-I70.0). Electronically Signed  By: Ronney Asters M.D.   On: 03/22/2021 20:13   DG CHEST PORT 1 VIEW  Result Date: 04/11/2021 CLINICAL DATA:  Leukocytosis EXAM: PORTABLE CHEST 1 VIEW COMPARISON:  03/29/2021 FINDINGS: Right upper extremity central venous catheter tip overlies the distal SVC. No focal opacity or pleural effusion. Normal cardiomediastinal silhouette with aortic atherosclerosis. No pneumothorax IMPRESSION: No active disease. Electronically Signed   By: Donavan Foil M.D.   On: 04/11/2021 21:42   DG CHEST PORT 1 VIEW  Result Date: 03/29/2021 CLINICAL DATA:  Chest pain EXAM: PORTABLE CHEST 1 VIEW COMPARISON:  03/25/2021 FINDINGS: Right upper extremity PICC tip overlies the superior cavoatrial junction. Unchanged cardiomediastinal silhouette. There are faint patchy bilateral airspace opacities. There is no large pleural effusion or visible pneumothorax. No acute osseous abnormality. IMPRESSION: Faint patchy bilateral airspace opacities, concerning for developing of multifocal infectious/inflammatory process. Recommend radiographic follow-up. Electronically Signed   By: Maurine Simmering M.D.   On: 03/29/2021 13:55   DG CHEST PORT 1 VIEW  Result Date: 03/25/2021 CLINICAL DATA:  Small bowel obstruction.  Colostomy present. EXAM: PORTABLE CHEST 1 VIEW; portable one-view abdomen COMPARISON:  One-view abdomen 03/17/2021 one-view chest x-ray 03/20/2021 FINDINGS: Heart size is normal. Interstitial and airspace opacities are again noted at the lung bases, right greater than left. Right PICC line is in place. The NG tube was removed. Dilated loops of small bowel are slightly more prominent than on  the prior study. Gas is noted within the colon. Degenerative changes are present in the lumbar spine. IMPRESSION: 1. Stable interstitial and airspace opacities at the lung bases, right greater than left. 2. Dilated loops of small bowel are slightly more prominent than on the prior study. Gas is noted within the colon. Electronically Signed   By: San Morelle M.D.   On: 03/25/2021 14:35   DG Abd Portable 1V  Result Date: 03/28/2021 CLINICAL DATA:  Postoperative ileus. EXAM: PORTABLE ABDOMEN - 1 VIEW COMPARISON:  March 25, 2021. FINDINGS: The bowel gas pattern is normal. No radio-opaque calculi or other significant radiographic abnormality are seen. IMPRESSION: Negative. Electronically Signed   By: Marijo Conception M.D.   On: 03/28/2021 09:49   DG Abd Portable 1V  Result Date: 03/25/2021 CLINICAL DATA:  Small bowel obstruction.  Colostomy present. EXAM: PORTABLE CHEST 1 VIEW; portable one-view abdomen COMPARISON:  One-view abdomen 03/17/2021 one-view chest x-ray 03/20/2021 FINDINGS: Heart size is normal. Interstitial and airspace opacities are again noted at the lung bases, right greater than left. Right PICC line is in place. The NG tube was removed. Dilated loops of small bowel are slightly more prominent than on the prior study. Gas is noted within the colon. Degenerative changes are present in the lumbar spine. IMPRESSION: 1. Stable interstitial and airspace opacities at the lung bases, right greater than left. 2. Dilated loops of small bowel are slightly more prominent than on the prior study. Gas is noted within the colon. Electronically Signed   By: San Morelle M.D.   On: 03/25/2021 14:35   VAS [place] UPPER EXTREMITY VENOUS DUPLEX  Result Date: 03/24/2021 UPPER VENOUS STUDY  Patient Name:  ZIYON SOLTAU  Date of Exam:   03/24/2021 Medical Rec #: [id number]         Accession #:    [id number] Date of Birth: 11/14/1950        Patient Gender: M Patient Age:   23 years Exam Location:   [facility] Procedure:      VAS [place] UPPER  EXTREMITY VENOUS DUPLEX Referring Phys: RAVI PAHWANI --------------------------------------------------------------------------------  Indications: Edema Risk Factors: None identified. Limitations: Poor ultrasound/tissue interface, bandages and line. Comparison Study: No prior studies. Performing Technologist: Oliver Hum RVT  Examination Guidelines: A complete evaluation includes B-mode imaging, spectral Doppler, color Doppler, and power Doppler as needed of all accessible portions of each vessel. Bilateral testing is considered an integral part of a complete examination. Limited examinations for reoccurring indications may be performed as noted.  Right Findings: +----------+------------+---------+-----------+----------+-------+ RIGHT     CompressiblePhasicitySpontaneousPropertiesSummary +----------+------------+---------+-----------+----------+-------+ IJV           Full       Yes       Yes                      +----------+------------+---------+-----------+----------+-------+ Subclavian    Full       Yes       Yes                      +----------+------------+---------+-----------+----------+-------+ Axillary      Full       Yes       Yes                      +----------+------------+---------+-----------+----------+-------+ Brachial      Full       Yes       Yes                      +----------+------------+---------+-----------+----------+-------+ Radial        Full                                          +----------+------------+---------+-----------+----------+-------+ Ulnar         Full                                          +----------+------------+---------+-----------+----------+-------+ Cephalic      Full                                          +----------+------------+---------+-----------+----------+-------+ Basilic       Full                                           +----------+------------+---------+-----------+----------+-------+  Left Findings: +----------+------------+---------+-----------+----------+-------+ LEFT      CompressiblePhasicitySpontaneousPropertiesSummary +----------+------------+---------+-----------+----------+-------+ Subclavian    Full       Yes       Yes                      +----------+------------+---------+-----------+----------+-------+  Summary:  Right: No evidence of deep vein thrombosis in the upper extremity. No evidence of superficial vein thrombosis in the upper extremity.  Left: No evidence of thrombosis in the subclavian.  *See table(s) above for measurements and observations.  Diagnosing physician: Jamelle Haring Electronically signed by Jamelle Haring on 03/24/2021 at 6:24:59 PM.    Final     Labs:  Basic Metabolic Panel: Recent Labs  Lab 04/18/21 0618  NA 131*  K  3.5  CL 96*  CO2 28  GLUCOSE 121*  BUN 25*  CREATININE 1.05  CALCIUM 8.7*    CBC: Recent Labs  Lab 04/18/21 0618  WBC 11.9*  NEUTROABS 6.9  HGB 10.3*  HCT 33.0*  MCV 88.9  PLT 606*    CBG: Recent Labs  Lab 04/19/21 2057 04/20/21 0609 04/20/21 1125 04/20/21 1628 04/20/21 2130  GLUCAP 129* 161* 153* 163* 175*   Family history.  Positive for hypertension as well as hyperlipidemia.  Denies any colon cancer esophageal cancer or rectal cancer  Brief HPI:   Travis Conrad is a 70 y.o. right-handed male with history of PAF status post ablation 10/03/2019 followed by Dr. [name] maintained on Eliquis, hyperlipidemia hypertension, tobacco/alcohol use, benign essential tremors and prior diverticulitis.  Per chart review lives with spouse independent prior to admission.  Presented 02/20/2021 with increasing abdominal pain nausea and vomiting times several days as well as decrease in appetite and decreased urination.  On presentation to the ED he was mildly hypotensive with systolic blood pressure in the 70s.  Noted WBC of 21,000 as well as AKI  with creatinine 3.2 lipase 22 lactic acid 1.3.  CT of abdomen pelvis showed inflammation of the sigmoid colon and a small amount of extraluminal gas consistent with perforated diverticulitis and abscess.  He was started on Zosyn in the ED as well as IV fluids and low-dose Levophed.  A nasogastric tube was placed for gastric decompression.  Close monitoring of blood pressure with history of PAF requiring intravenous amiodarone with echocardiogram completed 02/23/2021 showing ejection fraction of 60 to 65%.  Patient was seen in consultation by interventional radiology 02/24/2021 for aspiration and drain placement of intra-abdominal fluid collection per Dr. 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 Kieth Brightly.  Hospital course complicated by bouts of delirium developed postoperative anemia required 1 unit packed red blood cells with latest hemoglobin of 9.  He was cleared to resume Eliquis as prior to admission for PAF.  Urology consulted 03/02/2021 due to right scrotal pain with scrotal ultrasound demonstrating findings consistent with right epididymitis and a reactive complex hydrocele.  Developed VRE UTI placed on Zyvox through 04/04/2021 and maintained on contact precautions.  Developed left knee pain findings of positive effusion underwent left knee aspiration injection with good toleration fluid showed no crystals, gram stain with no organisms.  In regards to patient's AKI initially maintained on TPN renal function much improved latest creatinine 0.8 monitoring of sodium 128.  Therapy evaluations completed due to patient decreased functional mobility was admitted for a comprehensive rehab program.   Hospital Course: ANCELMO HUNT was admitted to rehab 04/06/2021 for inpatient therapies to consist of PT, ST and OT at least three hours five days a week. Past admission physiatrist,  therapy team and rehab RN have worked together to provide customized collaborative inpatient rehab.  Pertaining to patient's severe sepsis debility related to perforated diverticulitis status post exploratory laparotomy sigmoid colectomy with end colostomy drainage of intra-abdominal abscess wound VAC placement 02/26/2021.  Colostomy education provided he would follow-up with general surgery.  He remained on Eliquis for history of PAF cardiac rate controlled follow-up cardiology services.  Pain managed with use of Lidoderm patch as well as scheduled Robaxin with oxycodone as needed.  Postoperative anemia 9.1 no bleeding episodes.  He did complete a course of Zyvox for VRE UTI contact precautions remaining afebrile.  AKI  resolved monitoring of renal function.  Patient did have a history of essential tremors maintained on sotalol prior to admission currently on hold.  His blood pressure remained controlled and monitored.  Hospital course follow-up urology services for right epididymitis resolved completed course of doxycycline.  No dysuria or hematuria.  He did have a history of alcohol tobacco use NicoDerm patches advise providing counsel guards to cessation of these products.  Decreased nutritional storage initially on TPN diet slowly advanced.  In regards to patient's right knee arthritis effusion responded little to steroid injection he was participating with therapies with much encouragement.  X-ray showed joint effusion no evidence of joint space narrowing, osteophyte formation or lesion.  Uric acid level 5.1, sedimentation rate 35.  Orthopedic services was consulted he did receive aspiration of the right knee cultures were sent off results pending.  Patient did develop a nonspecific skin rash was not raised responded well to slow prednisone taper.   Blood pressures were monitored on TID basis and controlled     Rehab course: During patient's stay in rehab weekly team conferences were held to monitor  patient's progress, set goals and discuss barriers to discharge. At admission, patient required minimal guard 110 feet rolling walker minimal guard stand pivot transfers minimal guard sit to supine  Physical exam.  Blood pressure 123/73 pulse 86 temperature 98.5 respirations 19 oxygen saturations 100% room air Constitutional.  No acute distress HEENT Head.  Normocephalic and atraumatic Eyes.  Pupils round and reactive to light no discharge without nystagmus Neck.  Supple nontender no JVD without thyromegaly Cardiac regular rate rhythm any extra sounds or murmur heard Abdomen.  Soft nontender positive bowel sounds without rebound colostomy in place Neurologic.  Alert mildly anxious makes eye contact with examiner.  Oriented x3 follows commands.  He did have a mild left upper extremity resting tremor  He/She  has had improvement in activity tolerance, balance, postural control as well as ability to compensate for deficits. He/She has had improvement in functional use RUE/LUE  and [name]/[name] as well as improvement in awareness.  Sessions focused on basic ADLs reeducation functional sliding board transfers.  Completed seating grooming with supervision from wheelchair level.  Transported to the gym with total assist for a completed multiple sliding board transfers from wheelchair edge of bed contact-guard.  Patient practiced uphill transfers at edge of bed 24 inches as patient reports his bed at home is tall and was able to complete with assistance.  Discussed toilet transfers however patient continue to 1 use urinal.  He did complete sit to stand from wheelchair contact-guard for clothing management.  In regards to patient's functional mobility little progress made in CIR due to patient's poor participation ongoing education with his wife.  He did receive assistance by neuropsychology and patient's work with therapies.  Ambulates 20 feet rolling walker contact-guard assist.  He had some decreased step clearance  bilaterally sustaining knee flexion from initial contact and mid stance bilaterally.  Speech therapy did follow-up with working with compensatory memory strategies and training on phone use however patient continued to adamantly refuse most therapies.       Disposition: Discharged home   Diet: Mechanical soft  Special Instructions: No driving smoking or alcohol  Routine colostomy care  Medications at discharge 1.  Tylenol as needed 2.  Amiodarone 200 mg p.o. daily 3.  Eliquis 5 mg p.o. twice daily 4.  Voltaren gel 2 g 4 times daily to affected area 5.  Cardizem 60 mg every  6 hours 6.  Folic acid 1 mg p.o. daily 7.  Lasix 10 mg p.o. daily 8.  Lidoderm patch change as directed 9.  Magnesium gluconate 250 mg p.o. nightly 10.  Robaxin 1000 mg p.o. 4 times daily 11.  Multivitamin daily 12.  NicoDerm patch taper as directed 13.  Oxycodone 5 to 10 mg every 6 hours as needed pain 14.  Protonix 40 mg p.o. twice daily 15.  Rozerem 8 mg p.o. nightly 16.  Senokot S2 tablets nightly 17.  Vitamin D 50,000 units every 7 days  30-35 minutes were spent completing discharge summary and discharge planning     Follow-up Information     Kinsinger, Arta Bruce, MD Follow up.   Specialty: General Surgery Why: Call for appointment Contact information: [place] [address] [phone]         Raynelle Bring, MD Follow up.   Specialty: Urology Why: Call for appointment Contact information: [place] [address] [phone]         Constance Haw, MD Follow up.   Specialty: Cardiology Why: Call for appointment as needed Contact information: [address] [place] [address] [phone]         Meredith Staggers, MD Follow up.   Specialty: Physical Medicine and Rehabilitation Why: No formal follow-up needed Contact information: [address] [place] [address] [phone]                  Signed: Cathlyn Parsons 04/21/2021, 5:51 AM

## 2021-04-18 NOTE — Progress Notes (Signed)
Inpatient Rehabilitation Care Coordinator Discharge Note   Patient Details  Name: Travis Conrad MRN: [id number] Date of Birth: 17-Feb-1951   Discharge location: HOME WITH WIFE WHO IS ABLE TO ASSIST WITH HIS CARE  Length of Stay:  15 Days  Discharge activity level: SUPERVISION-MIN ASSIST LEVEL  Home/community participation: ACTIVE  Patient response GY:BWLSLH Literacy - How often do you need to have someone help you when you read instructions, pamphlets, or other written material from your doctor or pharmacy?: Never  Patient response TD:SKAJGO Isolation - How often do you feel lonely or isolated from those around you?: Never  Services provided included: MD, RD, PT, OT, SLP, RN, CM, Pharmacy, Neuropsych, SW  Financial Services:  Charity fundraiser Utilized: [place] offered to/list presented to: PT AND WIFE  Follow-up services arranged:  Home Health, Patient/Family request agency [place]: [facility]-PT,OT, RN  HAS ALL EQUIPMENT FROM PREVIOUS ADMITS OR IS BORROWING     HH/DME Requested Agency: [facility]  Patient response to transportation need: Is the patient able to respond to transportation needs?: Yes In the past 12 months, has lack of transportation kept you from medical appointments or from getting medications?: No In the past 12 months, has lack of transportation kept you from meetings, work, or from getting things needed for daily living?: No    Comments (or additional information): WIFE WAS [facility]. AT TIMES PT WAS NOT POLITE TO WIFE AND STAFF. FEEL READY TO GO HOME AND COMFORTABLE WITH HIS CARE.  Patient/Family verbalized understanding of follow-up arrangements:  Yes  Individual responsible for coordination of the follow-up plan: [name] [phone]  Confirmed correct DME delivered: [name] 04/18/2021    [name]

## 2021-04-19 ENCOUNTER — Inpatient Hospital Stay (HOSPITAL_COMMUNITY): Payer: Medicare Other

## 2021-04-19 ENCOUNTER — Other Ambulatory Visit (HOSPITAL_COMMUNITY): Payer: Self-pay

## 2021-04-19 ENCOUNTER — Encounter (HOSPITAL_COMMUNITY): Payer: Self-pay | Admitting: Physical Medicine & Rehabilitation

## 2021-04-19 LAB — SEDIMENTATION RATE: Sed Rate: 35 mm/hr — ABNORMAL HIGH (ref 0–16)

## 2021-04-19 LAB — GLUCOSE, CAPILLARY
Glucose-Capillary: 108 mg/dL — ABNORMAL HIGH (ref 70–99)
Glucose-Capillary: 130 mg/dL — ABNORMAL HIGH (ref 70–99)
Glucose-Capillary: 128 mg/dL — ABNORMAL HIGH (ref 70–99)
Glucose-Capillary: 129 mg/dL — ABNORMAL HIGH (ref 70–99)

## 2021-04-19 LAB — URIC ACID: Uric Acid, Serum: 5.1 mg/dL (ref 3.7–8.6)

## 2021-04-19 MED ORDER — APIXABAN 5 MG PO TABS
5.0000 mg | ORAL_TABLET | Freq: Two times a day (BID) | ORAL | 0 refills | Status: DC
  Filled 2021-04-19: qty 60, 30d supply, fill #0

## 2021-04-19 MED ORDER — NITROGLYCERIN 0.4 MG SL SUBL
0.4000 mg | SUBLINGUAL_TABLET | SUBLINGUAL | 0 refills | Status: AC | PRN
  Filled 2021-04-19: qty 25, 8d supply, fill #0

## 2021-04-19 MED ORDER — DICLOFENAC SODIUM 1 % EX GEL
2.0000 g | Freq: Four times a day (QID) | CUTANEOUS | 0 refills | Status: DC
  Filled 2021-04-19: qty 200, 28d supply, fill #0

## 2021-04-19 MED ORDER — METHOCARBAMOL 500 MG PO TABS
1000.0000 mg | ORAL_TABLET | Freq: Four times a day (QID) | ORAL | 0 refills | Status: DC
  Filled 2021-04-19: qty 240, 30d supply, fill #0

## 2021-04-19 MED ORDER — OXYCODONE HCL 5 MG PO TABS
5.0000 mg | ORAL_TABLET | Freq: Four times a day (QID) | ORAL | 0 refills | Status: DC | PRN
  Filled 2021-04-19: qty 30, 4d supply, fill #0

## 2021-04-19 MED ORDER — PREDNISONE 20 MG PO TABS
20.0000 mg | ORAL_TABLET | Freq: Every day | ORAL | Status: DC
  Administered 2021-04-20 – 2021-04-21 (×2): 20 mg via ORAL
  Filled 2021-04-19 (×2): qty 1

## 2021-04-19 MED ORDER — VITAMIN D (ERGOCALCIFEROL) 1.25 MG (50000 UNIT) PO CAPS
50000.0000 [IU] | ORAL_CAPSULE | ORAL | 0 refills | Status: DC
  Filled 2021-04-19: qty 5, 35d supply, fill #0

## 2021-04-19 MED ORDER — FUROSEMIDE 20 MG PO TABS
10.0000 mg | ORAL_TABLET | Freq: Every day | ORAL | 0 refills | Status: DC
  Filled 2021-04-19: qty 30, 60d supply, fill #0

## 2021-04-19 MED ORDER — FOLIC ACID 1 MG PO TABS
1.0000 mg | ORAL_TABLET | Freq: Every day | ORAL | 0 refills | Status: DC
  Filled 2021-04-19: qty 30, 30d supply, fill #0

## 2021-04-19 MED ORDER — SENNOSIDES-DOCUSATE SODIUM 8.6-50 MG PO TABS: 2.0000 | ORAL_TABLET | Freq: Two times a day (BID) | ORAL | Status: DC

## 2021-04-19 MED ORDER — NICOTINE 7 MG/24HR TD PT24: MEDICATED_PATCH | TRANSDERMAL | 0 refills | Status: DC

## 2021-04-19 MED ORDER — OXYCODONE HCL 5 MG PO TABS
10.0000 mg | ORAL_TABLET | ORAL | Status: DC | PRN
  Administered 2021-04-19 – 2021-04-20 (×5): 10 mg via ORAL
  Filled 2021-04-19 (×4): qty 2

## 2021-04-19 MED ORDER — MAGNESIUM OXIDE 400 MG PO TABS
200.0000 mg | ORAL_TABLET | Freq: Every day | ORAL | 0 refills | Status: DC
  Filled 2021-04-19: qty 30, 60d supply, fill #0

## 2021-04-19 MED ORDER — AMIODARONE HCL 200 MG PO TABS
200.0000 mg | ORAL_TABLET | Freq: Every day | ORAL | 0 refills | Status: DC
Start: 2021-04-19 — End: 2021-05-17
  Filled 2021-04-19: qty 30, 30d supply, fill #0

## 2021-04-19 MED ORDER — RAMELTEON 8 MG PO TABS
8.0000 mg | ORAL_TABLET | Freq: Every day | ORAL | 0 refills | Status: DC
  Filled 2021-04-19: qty 30, 30d supply, fill #0

## 2021-04-19 MED ORDER — PANTOPRAZOLE SODIUM 40 MG PO TBEC
40.0000 mg | DELAYED_RELEASE_TABLET | Freq: Two times a day (BID) | ORAL | 0 refills | Status: DC
  Filled 2021-04-19: qty 60, 30d supply, fill #0

## 2021-04-19 MED ORDER — ADULT MULTIVITAMIN W/MINERALS CH: 1.0000 | ORAL_TABLET | Freq: Every day | ORAL | Status: DC

## 2021-04-19 MED ORDER — PREDNISONE 20 MG PO TABS
20.0000 mg | ORAL_TABLET | Freq: Once | ORAL | Status: AC
  Administered 2021-04-19: 20 mg via ORAL
  Filled 2021-04-19: qty 1

## 2021-04-19 MED ORDER — DILTIAZEM HCL 60 MG PO TABS
60.0000 mg | ORAL_TABLET | Freq: Four times a day (QID) | ORAL | 0 refills | Status: DC
  Filled 2021-04-19: qty 120, 30d supply, fill #0

## 2021-04-19 MED ORDER — NICOTINE 14 MG/24HR TD PT24
MEDICATED_PATCH | TRANSDERMAL | 0 refills | Status: DC
  Filled 2021-04-19: qty 28, fill #0

## 2021-04-19 MED ORDER — LIDOCAINE 5 % EX PTCH
1.0000 | MEDICATED_PATCH | CUTANEOUS | 0 refills | Status: DC
  Filled 2021-04-19: qty 30, 30d supply, fill #0

## 2021-04-19 NOTE — Progress Notes (Signed)
Occupational Therapy Session Note  Patient Details  Name: Travis Conrad MRN: [id number] Date of Birth: 02/05/1951  Today's Date: 04/19/2021 OT Individual Time: 2409-7353 OT Individual Time Calculation (min): 40 min  and Today's Date: 04/19/2021 OT Missed Time: 20 Minutes Missed Time Reason: Pain   Short Term Goals: Week 2:  OT Short Term Goal 1 (Week 2): STGs = LTGs (LB dressing downgraded to min A)  Skilled Therapeutic Interventions/Progress Updates:    Pt received in bed stating "my knee is worse than ever!"   "I cant do anything today!"  Pt's wife present and reviewed with her how well he did yesterday with all mobility and self care at a supervision level.  Discussed practicing all self care at a seated level and using slide board for the days at home his knee is acting up.   Attempted several times to have pt come to edge of bed either using a gait belt to move his R leg or the leg lifter or my manual help. Each and every time we tried to move his leg he screamed out in pain. His brief was wet, so some rolling and bridging had to be done to facilitate movement to change clothing.  Pt c/o of pain with all movements but when his back started itching, he asked his wife to put lotion on his back and then he spontaneously rolled bending his R knee so she could reach his back.  No c/o pain.  Did notice edema on top of R knee and notified PA.   Pt resting in bed with wife attending to him. Stopped therapy early as pt is not able to participate.   Therapy Documentation Precautions:  Precautions Precautions: Fall Precaution Comments: abdominal incision, watch HR and BP Restrictions Weight Bearing Restrictions: No     Pain: Pain Assessment Pain Score: 10-Worst pain ever Pain Type: Acute pain Pain Location: Knee Pain Orientation: Right Pain Descriptors / Indicators: Throbbing Pain Onset: With Activity (pt UNABLE to tolerate any movement of his knee even with support) Pain  Intervention(s): Other (Comment) (PA made aware) ADL: ADL Eating: Set up Grooming: Setup Where Assessed-Grooming: Wheelchair, Sitting at sink Upper Body Bathing: Setup Where Assessed-Upper Body Bathing: Sitting at sink Lower Body Bathing: Supervision/safety Where Assessed-Lower Body Bathing: Sitting at sink Upper Body Dressing: Setup Where Assessed-Upper Body Dressing: Wheelchair Lower Body Dressing: Minimal assistance Where Assessed-Lower Body Dressing: Wheelchair Toileting: Other (Comment) (pt does not use a toilet due to ostomy and he only want to use the urinal) Where Assessed-Toileting: Other (Comment) (N/A) Toilet Transfer:  (Pt not toileting, but he can transfer to one of he chooses to urinate sitting on toilet) Toilet Transfer Method: Ambulating, Stand pivot (RW) Toilet Transfer Equipment: Bedside commode Tub/Shower Transfer: Unable to assess (safety/medical) ADL Comments:  (some ADLs not assessed d/t incision and ostomy)  Therapy/Group: Individual Therapy  [name] 04/19/2021, 10:13 AM

## 2021-04-19 NOTE — Progress Notes (Signed)
                                                       PROGRESS NOTE   Subjective/Complaints:  Pt states that right knee pain is "awful" and that it comes and goes but is as bad as ever this morning.   ROS: Patient denies fever, rash, sore throat, blurred vision, nausea, vomiting, diarrhea, cough, shortness of breath or chest pain,  headache, or mood change.    Objective:   No results found. Recent Labs    04/18/21 0618  WBC 11.9*  HGB 10.3*  HCT 33.0*  PLT 606*    Recent Labs    04/18/21 0618  NA 131*  K 3.5  CL 96*  CO2 28  GLUCOSE 121*  BUN 25*  CREATININE 1.05  CALCIUM 8.7*     Intake/Output Summary (Last 24 hours) at 04/19/2021 1136 Last data filed at 04/19/2021 0800 Gross per 24 hour  Intake --  Output 500 ml  Net -500 ml        Physical Exam: Vital Signs Blood pressure 133/67, pulse (!) 101, temperature 97.7 F (36.5 C), resp. rate 18, height 6' (1.829 m), weight 84.5 kg, SpO2 97 %.  Constitutional: No distress . Vital signs reviewed. HEENT: NCAT, EOMI, oral membranes moist Neck: supple Cardiovascular: RRR without murmur. No JVD    Respiratory/Chest: CTA Bilaterally without wheezes or rales. Normal effort    GI/Abdomen: BS +, non-tender, non-distended Ext: no clubbing, cyanosis, or edema Psych: anxious, perseverates on right knee pain Skin- not raised excoriation on arms, abd and torso- no signs of scabies or other bugs- VERY dry cracking skin- looks somewhat better and less scratching seen Musc: sig tender to touch along right femoral condyle. Knee not impressive for effusion. Mild warmth, no redness Neuro: Alert Makes eye contact with examiner.   Follows commands.   Motor:  [name]: HF, KE 2+/5 remains limited by pain [name]: 3/5 proximal to distal  Assessment/Plan: 1. Functional deficits which require 3+ hours per day of interdisciplinary therapy in a comprehensive inpatient rehab setting. Physiatrist is providing close team supervision and 24  hour management of active medical problems listed below. Physiatrist and rehab team continue to assess barriers to discharge/monitor patient progress toward functional and medical goals  Care Tool:  Bathing    Body parts bathed by patient: Left upper leg, Right upper leg, Front perineal area, Left arm, Right arm, Face, Chest, Buttocks, Right lower leg, Left lower leg (except for feet)   Body parts bathed by helper: Right lower leg, Left lower leg Body parts n/a: Abdomen   Bathing assist Assist Level: Supervision/Verbal cueing     Upper Body Dressing/Undressing Upper body dressing   What is the patient wearing?: Pull over shirt    Upper body assist Assist Level: Set up assist    Lower Body Dressing/Undressing Lower body dressing      What is the patient wearing?: Pants, Incontinence brief     Lower body assist Assist for lower body dressing: Supervision/Verbal cueing     Toileting Toileting Toileting Activity did not occur (Clothing management and hygiene only): N/A (no void or bm) (foley and ostomy)  Toileting assist Assist for toileting: Supervision/Verbal cueing (urinal)     Transfers Chair/bed transfer  Transfers assist  Chair/bed transfer activity did not   occur: Safety/medical concerns (Fatigue, agitation, pain)  Chair/bed transfer assist level: Supervision/Verbal cueing     Locomotion Ambulation   Ambulation assist      Assist level: Supervision/Verbal cueing Assistive device: Walker-rolling Max distance: 10'   Walk 10 feet activity   Assist     Assist level: Supervision/Verbal cueing Assistive device: Walker-rolling   Walk 50 feet activity   Assist Walk 50 feet with 2 turns activity did not occur: Safety/medical concerns (Fatigue, agitation, pain)         Walk 150 feet activity   Assist Walk 150 feet activity did not occur: Safety/medical concerns (Fatigue, agitation, pain)         Walk 10 feet on uneven surface   activity   Assist Walk 10 feet on uneven surfaces activity did not occur: Safety/medical concerns (Fatigue, agitation, pain)         Wheelchair     Assist Is the patient using a wheelchair?: Yes Type of Wheelchair: Manual    Wheelchair assist level: Supervision/Verbal cueing, Set up assist Max wheelchair distance: 150    Wheelchair 50 feet with 2 turns activity    Assist        Assist Level: Supervision/Verbal cueing   Wheelchair 150 feet activity     Assist      Assist Level: Supervision/Verbal cueing   Blood pressure 133/67, pulse (!) 101, temperature 97.7 F (36.5 C), resp. rate 18, height 6' (1.829 m), weight 84.5 kg, SpO2 97 %.   Medical Problem List and Plan: 1.  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 Interdisciplinary team conference today to discuss goals, barriers to discharge, and dc planning.   2.  Antithrombotics: -DVT/anticoagulation:  Pharmaceutical: Other (comment) Eliquis             -antiplatelet therapy: N/A 3. Pain: continue Lidoderm patch, Robaxin 1000 mg 4 times daily, oxycodone as needed    11/1- pain in right knee still limiting 4. Mood: Ativan 1 mg every 6 hours as needed             -antipsychotic agents: N/A 5. Neuropsych: This patient is capable of making decisions on his own behalf. 6. Skin/Wound Care: Routine skin checks/colostomy care  10/30- pt refusing to do ANY colostomy care- will increase bowel meds and give Sorbitol x 60cc today and extra dose of miralax- since blew seal, was so constipated.   10/31/ large stool thru colostomy   -increased senna-s to bid, continue mg++ 7. Fluids/Electrolytes/Nutrition:   8.  Postoperative anemia.     Hemoglobin 9.1 on 10/25  10/31 10.3 9.  Paroxysmal  atrial fibrillation.  Continue Amiodarone 200 mg daily, Cardizem 60 mg every 6 hours.  Follow-up cardiology services  In reg rhythm 10.  VRE UTI.  Completed course of Zyvox 04/04/2021.  Contact precautions. 11.  AKI.  BUN up, encourage fluids.   12.  Tremors.  Patient was on sotalol at home and currently on hold 13.  Hypertension.   Continue Lasix to 10 mg daily given frequent urination.   11/1- BP controlled- con't regimen 14.  Right epididymitis.  Resolved.  Completed course of doxycycline.  Follow-up urology services as needed 15.  Left knee arthritis/effusion.  Responded well to steroid injection. 16.  History of alcohol tobacco abuse.  NicoDerm patch.  Provide counseling  17.  Anasarca.  Decreased nutritional storage.  Marinol added for appetite stimulant 18.  Tobacco abuse.  NicoDerm patch.  Counseling 19. Insomnia: start Rozarem HS. Resolved. 20. Suboptimal magnesium levels: d/c colace and started magnesium gluconate 250mg HS.  21. Vitamin D deficiency: level 14 on 10/20. Ergocalciferol 50,000U once per week for 7 weeks ordered. 22. Inadequate oral intake: resolved. D/ced TPN. 23.  Hyponatremia  Sodium 130 on 10/24,   10/31 131 24. R knee pain-   11/1 pain "increased" per patient. TTT along lateral femoral condyle. Seems to perseverate on it a good bit. ESR elevated but much lower than September results. Uric acid unremarkable. ?behavioral component  -xray pending  -prednisone 20mg x 1 given to treat ?gout 25. New leukocytosis: Resolved  U/A and Cx and CXR neg 27. Skin rash  10/27- assessed with Dan and Jennifer- doesn't appear to be scabies- wil do prednisone 40 mg x 3 days and add menthol cream prn and eucerin BID.   10/28- itching better- might also need cortisone cream- will monitor  10/29- itching and scratching amount much bette-r less excoriation- is healing- con't regimen        LOS: 13 days A FACE TO FACE EVALUATION WAS PERFORMED   T  04/19/2021,  11:36 AM     

## 2021-04-19 NOTE — Patient Care Conference (Signed)
Inpatient RehabilitationTeam Conference and Plan of Care Update Date: 04/19/2021   Time: 12:22 PM    Patient Name: Travis Conrad      Medical Record Number: [id number]  Date of Birth: Jun 27, 1950 Sex: Male         Room/Bed: 4W13C/4W13C-01 Payor Info: Payor: [place] / Plan: BCBS MEDICARE / Product Type: *No Product type* /    Admit Date/Time:  04/06/2021  1:37 PM  Primary Diagnosis:  Sepsis [facility])  Hospital Problems: Principal Problem:   Sepsis ([place]) Active Problems:   Hyponatremia   Acute blood loss anemia   Benign essential HTN   Adjustment disorder with mixed anxiety and depressed mood    Expected Discharge Date: Expected Discharge Date: 04/20/21  Team Members Present: Physician leading conference: Dr. Alger Simons Social Worker Present: Ovidio Kin, LCSW Nurse Present: Dorthula Nettles, RN PT Present: Francena Hanly, PT OT Present: Meriel Pica, OT SLP Present: Sherren Kerns, SLP PPS Coordinator present : Gunnar Fusi, SLP     Current Status/Progress Goal Weekly Team Focus  Bowel/Bladder   cont of bladder, colostomy LBM 04/19/2021  educated patient on colostomy  contine education   Swallow/Nutrition/ Hydration             ADL's   Supervision with bathing, transfers, standing, UB dressing, donning pants, min to don socks  supervision overall, except for min A with LB dressing to don socks.  ADL training, activity tolerance, general strengthening, pt/fam education   Mobility   S* transfers, bed mobility, gait up to 40' w/RW w/lots of encouragement. Very unmotivated  S*  Pt/family edu, activity tolerance, transfers   Communication             Safety/Cognition/ Behavioral Observations            Pain   pain to right knee, refuses kpad. PRNS given  pain < 3/10  assess qshift and prn   Skin   incision to ostomy and abdomen wet to dry, colostomy  daily dressing changes  assess qshift and prn     Discharge Planning:  Wife has been  through family education, pt complaining knee pain. Home health arranged and equipment needs met. Pt reliant on wife to do more than he needs done-ie feeding   Team Discussion: Patient still self-limiting, dependent on others. Knee xray ordered. Labs look good. No signs of gout. Gave prednisone dose today. Treating UTI, colostomy in place. Decreased awareness and memory. Discharging home with wife. Patient on target to meet rehab goals: Pain is inconsistent. Got out of bed with RW and walked to W/C with no complaints of pain. Was incontinent of bladder during session. Behavior is inconsistent. More agitated when wife is present. Able to walk about 72f. Asks a lot of questions and needs a lot of cues  *See Care Plan and progress notes for long and short-term goals.   Revisions to Treatment Plan:  X-ray of knee ordered, working on attention, awareness, and memory  Teaching Needs: Medications, incision care, colostomy care, pain management, medications, safety, transfers, etc.  Current Barriers to Discharge: Decreased caregiver support, Home enviroment access/layout, Wound care, Weight, and Behavior  Possible Resolutions to Barriers: Family education with patient and spouse HH follow up services Has needed DME Supplies for ostomy care delivered to home     Medical Summary Current Status: increased right knee pain. perseverative on pain. knee injection without improvement. xrays ordered today, labs  Barriers to Discharge: Behavior;Medical stability   Possible Resolutions  to Barriers/Weekly Focus: work up right knee, xrays pending. empiric gout rx, bracing?   Continued Need for Acute Rehabilitation Level of Care: The patient requires daily medical management by a physician with specialized training in physical medicine and rehabilitation for the following reasons: Direction of a multidisciplinary physical rehabilitation program to maximize functional independence : Yes Medical management  of patient stability for increased activity during participation in an intensive rehabilitation regime.: Yes Analysis of laboratory values and/or radiology reports with any subsequent need for medication adjustment and/or medical intervention. : Yes   I attest that I was present, lead the team conference, and concur with the assessment and plan of the team.   Cristi Loron 04/19/2021, 2:39 PM

## 2021-04-19 NOTE — Progress Notes (Signed)
Physical Therapy Session Note  Patient Details  Name: Travis Conrad MRN: [id number] Date of Birth: 02-12-1951  Today's Date: 04/19/2021 PT Individual Time: 1131-1155 PT Individual Time Calculation (min): 24 min   Short Term Goals: Week 1:  PT Short Term Goal 1 (Week 1): STG = LTG due to ELOS PT Short Term Goal 1 - Progress (Week 1): Progressing toward goal Week 2:  PT Short Term Goal 1 (Week 2): STG = LTG due to LOS  Skilled Therapeutic Interventions/Progress Updates:  Pt received supine in bed, wife present, reported pain in R knee as 10/10 and was premedicated. Offered positional changes, pt refused all therapy. Emphasis of session on establishing HEP for pt and wife to complete at home. Provided pt w/handout of supine and standing exercises and encouraged pt to begin walking program at home. Pt not agreeable to walking program and verbalized dissatisfaction w/exercises. Pt has refused all education regarding mobility and exercise throughout stay in CIR. Pt was left supine in bed, all needs in reach.   Therapy Documentation Precautions:  Precautions Precautions: Fall Precaution Comments: abdominal incision Restrictions Weight Bearing Restrictions: No  Therapy/Group: Individual Therapy Cruzita Lederer Reesa Gotschall, PT, DPT  04/19/2021, 1:02 PM

## 2021-04-19 NOTE — Progress Notes (Addendum)
Patient ID: Travis Conrad, male   DOB: 1950-11-24, 70 y.o.   MRN: [id number]  Met with pt and wife to give team conference update and issues with pt's knee. MD has ordered x-ray and is giving him predinsone. Still aiming for discharge tomorrow. Will be up to MD regarding this. Both are aware of this and hope his knee feels better tomorrow. Wife hopes this can be dealt with here before he goes home.

## 2021-04-19 NOTE — Progress Notes (Signed)
Occupational Therapy Session Note  Patient Details  Name: Travis Conrad MRN: [id number] Date of Birth: 09/22/1950  Today's Date: 04/19/2021 OT Individual Time: 1415-1510 OT Individual Time Calculation (min): 55 min   Missed 50 minutes of therapy today due to xray at start of first session, attempted to make up during 2nd session but patient with limited participation due to fatigue and ongoing pain in right knee  Short Term Goals: Week 1:  OT Short Term Goal 1 (Week 1): Pt will perform consistently perform sit > stands CGA  with LRAD OT Short Term Goal 1 - Progress (Week 1): Progressing toward goal OT Short Term Goal 2 (Week 1): Pt will don pants with MIN A OT Short Term Goal 2 - Progress (Week 1): Progressing toward goal OT Short Term Goal 3 (Week 1): Pt will tolerate sitting EOB/EOM for 6 min in prep for grooming with close (S) OT Short Term Goal 3 - Progress (Week 1): Met Week 2:  OT Short Term Goal 1 (Week 2): STGs = LTGs (LB dressing downgraded to min A)  Skilled Therapeutic Interventions/Progress Updates:    Session 1:  at start of session, tech came to take patient to xray - will make up time as able upon his return.   Session 2:  patient in bed upon return agreeable to completing bed level activity but adamantly refuses to attempt rolling in bed or moving to sitting position.  He completed oral care with set up, voided with urinal x2 set up.  Reviewed pain management techniques all of which he refused with exception of using cane to self assist right knee into flexion which he did moving approx 10 degrees for 10-15 reps, repeating at times during session without cues.  He completed right ankle self ROM and hip mobility with limited knee activity.  He did note less throbbing of right knee, but continues with pain limiting activity.  He completed left leg AROM activities, bilateral arm AROM activities but notes fatigue.  Discussed limiting excessive external rotation at hip with  support of towel which he agrees to.  Ended session with patient in bed, bed alarm set, call bell in hand.    Therapy Documentation Precautions:  Precautions Precautions: Fall Precaution Comments: abdominal incision, watch HR and BP Restrictions Weight Bearing Restrictions: No   Therapy/Group: Individual Therapy  Carlos Levering 04/19/2021, 7:46 AM

## 2021-04-19 NOTE — Progress Notes (Signed)
Occupational Therapy Discharge Summary  Patient Details  Name: Travis Conrad MRN: [id number] Date of Birth: July 04, 1950    Patient has met 6 of 6 long term goals due to improved activity tolerance, improved balance, and ability to compensate for deficits.  Patient to discharge at overall Supervision level.  Patient's care partner is independent to provide the necessary physical and cognitive assistance at discharge.    Reasons goals not met: n/a  Recommendation:  Patient will benefit from ongoing skilled OT services in home health setting to continue to advance functional skills in the area of BADL and Reduce care partner burden.  Equipment: No equipment provided - pt has a BSC at home if he chooses to use it in the future  Reasons for discharge: treatment goals met  Patient/family agrees with progress made and goals achieved: Yes  OT Discharge Precautions/Restrictions  Precautions Precaution Comments: abdominal incision Restrictions Weight Bearing Restrictions: No   ADL ADL Eating: Set up Grooming: Setup Where Assessed-Grooming: Wheelchair, Sitting at sink Upper Body Bathing: Setup Where Assessed-Upper Body Bathing: Sitting at sink Lower Body Bathing: Supervision/safety Where Assessed-Lower Body Bathing: Sitting at sink Upper Body Dressing: Setup Where Assessed-Upper Body Dressing: Wheelchair Lower Body Dressing: Minimal assistance Where Assessed-Lower Body Dressing: Wheelchair Toileting: Other (Comment) (pt does not use a toilet due to ostomy and he only want to use the urinal) Where Assessed-Toileting: Other (Comment) (N/A) Toilet Transfer:  (Pt not toileting, but he can transfer to one of he chooses to urinate sitting on toilet) Toilet Transfer Method: Ambulating, Stand pivot (RW) Science writer: Bedside commode Tub/Shower Transfer: Unable to assess (safety/medical) ADL Comments:  (some ADLs not assessed d/t incision and ostomy) Vision Baseline  Vision/History: 1 Wears glasses Patient Visual Report: Blurring of vision Additional Comments: pt continues to have blurry vision, legally blind in L eye Perception  Perception: Within Functional Limits Praxis Praxis: Intact Cognition Overall Cognitive Status: Impaired/Different from baseline Arousal/Alertness: Awake/alert Orientation Level: Oriented X4 Year: 2021 Month: October Day of Week: Correct Memory: Impaired Memory Impairment: Retrieval deficit;Decreased recall of new information;Other (comment) Decreased Short Term Memory: Verbal basic Immediate Memory Recall: Sock;Blue;Bed Memory Recall Sock: Without Cue Memory Recall Blue: Without Cue Memory Recall Bed: Without Cue Awareness: Impaired Awareness Impairment: Emergent impairment Problem Solving: Impaired Problem Solving Impairment: Functional basic Sensation Sensation Light Touch: Appears Intact Hot/Cold: Appears Intact Proprioception: Appears Intact Stereognosis: Appears Intact Coordination Gross Motor Movements are Fluid and Coordinated: No Fine Motor Movements are Fluid and Coordinated: No Coordination and Movement Description: Impacted by pain, fatigue, and impaired vision Motor  Motor Motor - Discharge Observations: Limited by pain in R knee Mobility    Close S with bed mobility, sit to stand and transfers Trunk/Postural Assessment  Postural Control Postural Control: Within Functional Limits  Balance Static Standing Balance Static Standing - Level of Assistance: 6: Modified independent (Device/Increase time) Dynamic Standing Balance Dynamic Standing - Level of Assistance: 5: Stand by assistance Extremity/Trunk Assessment RUE Assessment RUE Assessment: Within Functional Limits General Strength Comments: sh flexion to 150 due to rotator cuff impairment LUE Assessment LUE Assessment: Within Functional Limits   Shahrukh Pasch 04/19/2021, 12:40 PM

## 2021-04-19 NOTE — Progress Notes (Signed)
Physical Therapy Discharge Summary  Patient Details  Name: Travis Conrad MRN: [id number] Date of Birth: 1951-02-26    Patient has met 8 of 10 long term goals due to improved activity tolerance, improved balance, increased strength, decreased pain, and improved awareness.  Patient to discharge at  [facility] and ambulatory  level Supervision.   Patient's care partner requires assistance from family to provide the necessary physical and cognitive assistance at discharge. Patient and wife have refused majority of education during stay in CIR and have shown no signs of learning.   Reasons goals not met: Pt only able to ambulate up to 9' w/RW and S*, not reaching his goals of 52' and 49' for community and household ambulation due to R knee pain.   Recommendation:  Patient will benefit from ongoing skilled PT services in home health setting to continue to advance safe functional mobility, address ongoing impairments in cardiovascular endurance, balance, BLE ROM, global strength and minimize fall risk.  Equipment: Pt received RW in acute care, declined WC   Reasons for discharge: treatment goals met and discharge from hospital  Patient/family agrees with progress made and goals achieved: Yes   PT Discharge Precautions/Restrictions Precautions Precautions: Fall Restrictions Weight Bearing Restrictions: No Pain Interference Pain Interference Pain Effect on Sleep: 4. Almost constantly Pain Interference with Therapy Activities: 4. Almost constantly Pain Interference with Day-to-Day Activities: 4. Almost constantly Vision/Perception  Vision - History Ability to See in Adequate Light: 2 Moderately impaired Perception Perception: Within Functional Limits Praxis Praxis: Intact  Cognition Overall Cognitive Status: Impaired/Different from baseline Arousal/Alertness: Awake/alert Orientation Level: Oriented X4 Safety/Judgment: Impaired Comments: Pt demonstrates deficits in memory and spatial  awareness, poor vision Sensation Sensation Light Touch: Appears Intact Coordination Gross Motor Movements are Fluid and Coordinated: No Coordination and Movement Description: Impacted by pain, fatigue, and impaired vision Finger Nose Finger Test: Significant dymetria bilaterally, impaired vision L > R eye Motor  Motor Motor: Within Functional Limits Motor - Discharge Observations: Limited by pain in R knee  Mobility Bed Mobility Bed Mobility: Supine to Sit;Sitting - Scoot to [employer] of Bed;Sit to Supine Supine to Sit: Independent with assistive device Sitting - Scoot to Edge of Bed: Independent with assistive device Sit to Supine: Independent with assistive device Transfers Transfers: Sit to Stand;Stand to Sit;Stand Pivot Transfers Sit to Stand: Supervision/Verbal cueing Stand to Sit: Supervision/Verbal cueing Stand Pivot Transfers: Supervision/Verbal cueing Stand Pivot Transfer Details: Verbal cues for safe use of DME/AE Transfer (Assistive device): Rolling walker Locomotion  Gait Ambulation: Yes Gait Assistance: Supervision/Verbal cueing Gait Distance (Feet): 40 Feet Assistive device: Rolling walker Gait Assistance Details: Verbal cues for safe use of DME/AE;Verbal cues for sequencing;Verbal cues for technique;Verbal cues for gait pattern Gait Gait: Yes Gait Pattern: Impaired Gait Pattern: Decreased step length - right;Decreased step length - left;Decreased stride length;Antalgic;Trunk flexed;Poor foot clearance - right;Poor foot clearance - left Gait velocity: Decreased Stairs / Additional Locomotion Stairs: No Ramp: Supervision/Verbal cueing Wheelchair Mobility Wheelchair Mobility: Yes Wheelchair Assistance: Chartered loss adjuster: Both upper extremities Wheelchair Parts Management: Needs assistance Distance: 150'  Trunk/Postural Assessment  Cervical Assessment Cervical Assessment: Exceptions to [facility] (Forward head) Thoracic  Assessment Thoracic Assessment: Exceptions to [facility] (Kyphotic) Lumbar Assessment Lumbar Assessment: Exceptions to [facility] (Posterior pelvic tilt) Postural Control Postural Control: Within Functional Limits  Balance Balance Balance Assessed: Yes Static Sitting Balance Static Sitting - Balance Support: Feet supported;Bilateral upper extremity supported Static Sitting - Level of Assistance: 7: Independent Dynamic Sitting Balance Dynamic Sitting - Balance Support:  Feet supported Dynamic Sitting - Level of Assistance: 7: Independent Dynamic Sitting - Balance Activities: Lateral lean/weight shifting;Forward lean/weight shifting Static Standing Balance Static Standing - Balance Support: During functional activity;Bilateral upper extremity supported Static Standing - Level of Assistance: 6: Modified independent (Device/Increase time) Dynamic Standing Balance Dynamic Standing - Balance Support: During functional activity;Bilateral upper extremity supported Dynamic Standing - Level of Assistance: 5: Stand by assistance Dynamic Standing - Balance Activities: Lateral lean/weight shifting;Forward lean/weight shifting;Reaching for objects Extremity Assessment  [name] Assessment [name] Assessment: Exceptions to [facility] [name] Strength [name] Overall Strength: Due to pain;Deficits Right Hip Flexion: 4+/5 Right Hip Extension: 4+/5 Right Hip ABduction: 4+/5 Right Hip ADduction: 4+/5 Right Knee Flexion: 3-/5 Right Knee Extension: 3-/5 Right Ankle Dorsiflexion: 4+/5 Right Ankle Plantar Flexion: 4+/5 [name] Assessment [name] Assessment: Exceptions to [facility] [name] Strength [name] Overall Strength: Within Functional Limits for tasks assessed Left Hip Flexion: 4+/5 Left Hip Extension: 4+/5 Left Hip ABduction: 4+/5 Left Hip ADduction: 4+/5 Left Knee Flexion: 4+/5 Left Knee Extension: 4+/5 Left Ankle Dorsiflexion: 4+/5 Left Ankle Plantar Flexion: 4+/5   Keyan Folson E Josi Roediger, PT, DPT 04/21/2021, 7:45 AM

## 2021-04-20 ENCOUNTER — Other Ambulatory Visit (HOSPITAL_COMMUNITY): Payer: Self-pay

## 2021-04-20 LAB — SYNOVIAL CELL COUNT + DIFF, W/ CRYSTALS
Crystals, Fluid: NONE SEEN
Eosinophils-Synovial: 0 % (ref 0–1)
Lymphocytes-Synovial Fld: 26 % — ABNORMAL HIGH (ref 0–20)
Monocyte-Macrophage-Synovial Fluid: 28 % — ABNORMAL LOW (ref 50–90)
Neutrophil, Synovial: 46 % — ABNORMAL HIGH (ref 0–25)
WBC, Synovial: 122 /mm3 (ref 0–200)

## 2021-04-20 LAB — GLUCOSE, CAPILLARY
Glucose-Capillary: 161 mg/dL — ABNORMAL HIGH (ref 70–99)
Glucose-Capillary: 153 mg/dL — ABNORMAL HIGH (ref 70–99)
Glucose-Capillary: 163 mg/dL — ABNORMAL HIGH (ref 70–99)
Glucose-Capillary: 175 mg/dL — ABNORMAL HIGH (ref 70–99)

## 2021-04-20 MED ORDER — METHYLPREDNISOLONE ACETATE 40 MG/ML IJ SUSP
40.0000 mg | Freq: Once | INTRAMUSCULAR | Status: DC
  Filled 2021-04-20: qty 1

## 2021-04-20 MED ORDER — BUPIVACAINE HCL (PF) 0.5 % IJ SOLN
10.0000 mL | Freq: Once | INTRAMUSCULAR | Status: AC
  Administered 2021-04-20: 10 mL
  Filled 2021-04-20: qty 10

## 2021-04-20 NOTE — Consult Note (Signed)
Reason for Consult:Right knee pain Referring Physician: Oval Linsey Time called: 0803 Time at bedside: Blue Bell is an 70 y.o. male.  HPI: Rykin has been in [place] for about 2 weeks. Ever since he's been in the hospital he's been having pain in one or both of his knees. He denies prior hx/o similar or hx/o gout. The left knee was aspirated in late September and was non-diagnostic though the aspiration was therapeutic. He underwent steroid injection in the right knee last week with brief and mild improvement.  Past Medical History:  Diagnosis Date   Atrial fibrillation ([place])    Dysrhythmia    a-fib   High cholesterol    "RX made groin hurt" (08/14/2016)   Hypertension     Past Surgical History:  Procedure Laterality Date   ANKLE FRACTURE SURGERY Left ~ 2012   "put rod and pins in"   ATRIAL FIBRILLATION ABLATION N/A 10/03/2019   Procedure: ATRIAL FIBRILLATION ABLATION;  Surgeon: Constance Haw, MD;  Location: [place] [facility];  Service: Cardiovascular;  Laterality: N/A;   COLECTOMY N/A 02/26/2021   Procedure: PARTIAL COLECTOMY AND COLOSTOMY;  Surgeon: Kieth Brightly, Arta Bruce, MD;  Location: [place];  Service: General;  Laterality: N/A;   COLONOSCOPY W/ BIOPSIES AND POLYPECTOMY  ~ 2000   FRACTURE SURGERY     LAPAROTOMY  02/26/2021   Procedure: EXPLORATORY LAPAROTOMY;  Surgeon: Mickeal Skinner, MD;  Location: [place];  Service: General;;   ORIF FIBULA FRACTURE  06/24/2012   Procedure: OPEN REDUCTION INTERNAL FIXATION (ORIF) FIBULA FRACTURE;  Surgeon: [name], MD;  Location: [place];  Service: Orthopedics;  Laterality: Left;  ORIF LEFT FIBULA,  POSSIBLE REPAIR OF SYNDESMOSIS    TEE WITHOUT CARDIOVERSION N/A 10/03/2019   Procedure: TRANSESOPHAGEAL ECHOCARDIOGRAM (TEE);  Surgeon: Constance Haw, MD;  Location: [place] [facility];  Service: Cardiovascular;  Laterality: N/A;    Family History  Problem Relation Age of Onset   Other Brother     Social History:   reports that he has been smoking cigarettes. He has a 50.00 pack-year smoking history. He has never used smokeless tobacco. He reports that he does not currently use alcohol. He reports current drug use. Drugs: Cocaine and Marijuana.  Allergies:  Allergies  Allergen Reactions   Atorvastatin Other (See Comments)    myalgias    Medications: I have reviewed the patient's current medications.  Results for orders placed or performed during the hospital encounter of 04/06/21 (from the past 48 hour(s))  Glucose, capillary     Status: Abnormal   Collection Time: 04/18/21  4:52 PM  Result Value Ref Range   Glucose-Capillary 103 (H) 70 - 99 mg/dL    Comment: Glucose reference range applies only to samples taken after fasting for at least 8 hours.  Glucose, capillary     Status: Abnormal   Collection Time: 04/18/21  8:49 PM  Result Value Ref Range   Glucose-Capillary 118 (H) 70 - 99 mg/dL    Comment: Glucose reference range applies only to samples taken after fasting for at least 8 hours.  Glucose, capillary     Status: Abnormal   Collection Time: 04/19/21  6:18 AM  Result Value Ref Range   Glucose-Capillary 108 (H) 70 - 99 mg/dL    Comment: Glucose reference range applies only to samples taken after fasting for at least 8 hours.  Uric acid     Status: None   Collection Time: 04/19/21  8:40  AM  Result Value Ref Range   Uric Acid, Serum 5.1 3.7 - 8.6 mg/dL    Comment: Performed at [facility], [place] [address] [place], [place] [address]  Sedimentation rate     Status: Abnormal   Collection Time: 04/19/21  8:40 AM  Result Value Ref Range   Sed Rate 35 (H) 0 - 16 mm/hr    Comment: Performed at [place] [address] [place], [place] [address]  Glucose, capillary     Status: Abnormal   Collection Time: 04/19/21 11:19 AM  Result Value Ref Range   Glucose-Capillary 130 (H) 70 - 99 mg/dL    Comment: Glucose reference range applies only to samples taken after fasting for at  least 8 hours.  Glucose, capillary     Status: Abnormal   Collection Time: 04/19/21  4:58 PM  Result Value Ref Range   Glucose-Capillary 128 (H) 70 - 99 mg/dL    Comment: Glucose reference range applies only to samples taken after fasting for at least 8 hours.  Glucose, capillary     Status: Abnormal   Collection Time: 04/19/21  8:57 PM  Result Value Ref Range   Glucose-Capillary 129 (H) 70 - 99 mg/dL    Comment: Glucose reference range applies only to samples taken after fasting for at least 8 hours.  Glucose, capillary     Status: Abnormal   Collection Time: 04/20/21  6:09 AM  Result Value Ref Range   Glucose-Capillary 161 (H) 70 - 99 mg/dL    Comment: Glucose reference range applies only to samples taken after fasting for at least 8 hours.  Glucose, capillary     Status: Abnormal   Collection Time: 04/20/21 11:25 AM  Result Value Ref Range   Glucose-Capillary 153 (H) 70 - 99 mg/dL    Comment: Glucose reference range applies only to samples taken after fasting for at least 8 hours.   Comment 1 Notify RN     DG Knee 1-2 Views Right  Result Date: 04/19/2021 CLINICAL DATA:  Lateral knee pain over the last few days. No specific injury. EXAM: RIGHT KNEE - 1-2 VIEW COMPARISON:  None. FINDINGS: Moderate knee joint effusion. No weight-bearing compartment joint space narrowing or osteophyte formation. Patellofemoral compartment appears unremarkable. No focal bone lesion. Regional arterial calcification is noted. IMPRESSION: Joint effusion. No evidence of joint space narrowing, osteophyte formation or focal lesion otherwise. Regional arterial calcification. Electronically Signed   By: Nelson Chimes M.D.   On: 04/19/2021 14:12    Review of Systems  HENT:  Negative for ear discharge, ear pain, hearing loss and tinnitus.   Eyes:  Negative for photophobia and pain.  Respiratory:  Negative for cough and shortness of breath.   Cardiovascular:  Negative for chest pain.  Gastrointestinal:  Negative  for abdominal pain, nausea and vomiting.  Genitourinary:  Negative for dysuria, flank pain, frequency and urgency.  Musculoskeletal:  Positive for arthralgias (Right knee). Negative for back pain, myalgias and neck pain.  Neurological:  Negative for dizziness and headaches.  Hematological:  Does not bruise/bleed easily.  Psychiatric/Behavioral:  The patient is not nervous/anxious.   Blood pressure 100/71, pulse 87, temperature 97.6 F (36.4 C), resp. rate 17, height 6' (1.829 m), weight 81.5 kg, SpO2 99 %. Physical Exam Constitutional:      General: He is not in acute distress.    Appearance: He is well-developed. He is not diaphoretic.  HENT:     Head: Normocephalic and atraumatic.  Eyes:     General: No scleral icterus.       Right eye: No discharge.        Left eye: No discharge.     Conjunctiva/sclera: Conjunctivae normal.  Cardiovascular:     Rate and Rhythm: Normal rate and regular rhythm.  Pulmonary:     Effort: Pulmonary effort is normal. No respiratory distress.  Musculoskeletal:     Cervical back: Normal range of motion.     Comments: [name] No traumatic wounds, ecchymosis, or rash  Mild diffuse TTP, not very well reproducible  Mild knee effusion  Knee stable to varus/ valgus and anterior/posterior stress  Sens DPN, SPN, TN intact  Motor EHL, ext, flex, evers 5/5  DP 2+, PT 1+, No significant edema  Skin:    General: Skin is warm and dry.  Neurological:     Mental Status: He is alert.  Psychiatric:        Mood and Affect: Mood normal.        Behavior: Behavior normal.    Assessment/Plan: Right knee effusion -- Will perform aspiration and injection with Marcaine. Too early for another steroid dose. Given pain and arthritis on x-ray recommended several joint surgeons in town for follow up.    Lisette Abu, PA-C Orthopedic Surgery [phone] 04/20/2021, 2:50 PM

## 2021-04-20 NOTE — Procedures (Signed)
Procedure: Right knee aspiration and injection   Indication: Right knee effusion(s)   Surgeon: Silvestre Gunner, PA-C   Assist: None   Anesthesia: Topical refrigerant   EBL: None   Complications: None   Findings: After risks/benefits explained patient desires to undergo procedure. Consent obtained and time out performed. The right knee was sterilely prepped and aspirated. 2ml clear yellow fluid obtained. 75ml 0.5% Marcaine instilled. Pt tolerated the procedure well.       Lisette Abu, PA-C Orthopedic Surgery [phone]

## 2021-04-20 NOTE — Progress Notes (Signed)
PROGRESS NOTE   Subjective/Complaints:  Right knee remains very tender. Received prednisone 21m x 2 without relief. "Ice doesn't help". Pain is lateral along right knee.   ROS: Patient denies fever, rash, sore throat, blurred vision, nausea, vomiting, diarrhea, cough, shortness of breath or chest pain, headache, or mood change.    Objective:   DG Knee 1-2 Views Right  Result Date: 04/19/2021 CLINICAL DATA:  Lateral knee pain over the last few days. No specific injury. EXAM: RIGHT KNEE - 1-2 VIEW COMPARISON:  None. FINDINGS: Moderate knee joint effusion. No weight-bearing compartment joint space narrowing or osteophyte formation. Patellofemoral compartment appears unremarkable. No focal bone lesion. Regional arterial calcification is noted. IMPRESSION: Joint effusion. No evidence of joint space narrowing, osteophyte formation or focal lesion otherwise. Regional arterial calcification. Electronically Signed   By: MNelson ChimesM.D.   On: 04/19/2021 14:12   Recent Labs    04/18/21 0618  WBC 11.9*  HGB 10.3*  HCT 33.0*  PLT 606*    Recent Labs    04/18/21 0618  NA 131*  K 3.5  CL 96*  CO2 28  GLUCOSE 121*  BUN 25*  CREATININE 1.05  CALCIUM 8.7*     Intake/Output Summary (Last 24 hours) at 04/20/2021 0817 Last data filed at 04/20/2021 0546 Gross per 24 hour  Intake 240 ml  Output 1100 ml  Net -860 ml        Physical Exam: Vital Signs Blood pressure 105/79, pulse 99, temperature 97.9 F (36.6 C), resp. rate 18, height 6' (1.829 m), weight 81.5 kg, SpO2 98 %.  Constitutional: No distress . Vital signs reviewed. HEENT: NCAT, EOMI, oral membranes moist Neck: supple Cardiovascular: RRR without murmur. No JVD    Respiratory/Chest: CTA Bilaterally without wheezes or rales. Normal effort    GI/Abdomen: BS +, non-tender, non-distended Ext: no clubbing, cyanosis, or edema Psych: pleasant and cooperative  Skin- warm,  dry Musc: sig tenderness focused along right lateral femoral condyle, fibula. There is an effusion above the patella. Cannot tolerate ROM.  Nor abnormal warmth or discoloration/redness Neuro: Alert Makes eye contact with examiner.   Follows commands.   Motor:  [name]: HF, KE 2+/5 remains limited by pain [name]: 3/5 proximal to distal  Assessment/Plan: 1. Functional deficits which require 3+ hours per day of interdisciplinary therapy in a comprehensive inpatient rehab setting. Physiatrist is providing close team supervision and 24 hour management of active medical problems listed below. Physiatrist and rehab team continue to assess barriers to discharge/monitor patient progress toward functional and medical goals  Care Tool:  Bathing    Body parts bathed by patient: Left upper leg, Right upper leg, Front perineal area, Left arm, Right arm, Face, Chest, Buttocks, Right lower leg, Left lower leg (except for feet)   Body parts bathed by helper: Right lower leg, Left lower leg Body parts n/a: Abdomen   Bathing assist Assist Level: Supervision/Verbal cueing     Upper Body Dressing/Undressing Upper body dressing   What is the patient wearing?: Pull over shirt    Upper body assist Assist Level: Set up assist    Lower Body Dressing/Undressing Lower body dressing      What  is the patient wearing?: Pants, Incontinence brief     Lower body assist Assist for lower body dressing: Supervision/Verbal cueing     Toileting Toileting Toileting Activity did not occur (Clothing management and hygiene only): N/A (no void or bm) (foley and ostomy)  Toileting assist Assist for toileting: Supervision/Verbal cueing (urinal)     Transfers Chair/bed transfer  Transfers assist  Chair/bed transfer activity did not occur: Safety/medical concerns (Fatigue, agitation, pain)  Chair/bed transfer assist level: Supervision/Verbal cueing     Locomotion Ambulation   Ambulation assist      Assist  level: Supervision/Verbal cueing Assistive device: Walker-rolling Max distance: 40'   Walk 10 feet activity   Assist     Assist level: Supervision/Verbal cueing Assistive device: Walker-rolling   Walk 50 feet activity   Assist Walk 50 feet with 2 turns activity did not occur: Safety/medical concerns (Pain, agitation)         Walk 150 feet activity   Assist Walk 150 feet activity did not occur: Safety/medical concerns (Pain, agitation)         Walk 10 feet on uneven surface  activity   Assist Walk 10 feet on uneven surfaces activity did not occur: Safety/medical concerns (Pain, agitation)         Wheelchair     Assist Is the patient using a wheelchair?: Yes Type of Wheelchair: Manual    Wheelchair assist level: Set up assist, Supervision/Verbal cueing Max wheelchair distance: 150    Wheelchair 50 feet with 2 turns activity    Assist        Assist Level: Supervision/Verbal cueing   Wheelchair 150 feet activity     Assist      Assist Level: Supervision/Verbal cueing   Blood pressure 105/79, pulse 99, temperature 97.9 F (36.6 C), resp. rate 18, height 6' (1.829 m), weight 81.5 kg, SpO2 98 %.   Medical Problem List and Plan: 1.  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  Antithrombotics: -DVT/anticoagulation:  Pharmaceutical: Other (comment) Eliquis             -antiplatelet therapy: N/A 3. Pain: continue Lidoderm patch, Robaxin 1000 mg 4 times daily, oxycodone as needed    11/1- pain in right knee still limiting 4. Mood: Ativan 1 mg every 6 hours as needed             -antipsychotic agents: N/A 5. Neuropsych: This patient is capable of making decisions on his own behalf. 6. Skin/Wound Care: Routine skin checks/colostomy care  10/30-  pt refusing to do ANY colostomy care- will increase bowel meds and give Sorbitol x 60cc today and extra dose of miralax- since blew seal, was so constipated.   10/31/ large stool thru colostomy   -increased senna-s to bid, continue mg++ 7. Fluids/Electrolytes/Nutrition:   8.  Postoperative anemia.     Hemoglobin 9.1 on 10/25  10/31 10.3 9.  Paroxysmal atrial fibrillation.  Continue Amiodarone 200 mg daily, Cardizem 60 mg every 6 hours.  Follow-up cardiology services  In reg rhythm 10.  VRE UTI.  Completed course of Zyvox 04/04/2021.  Contact precautions. 11.  AKI.  BUN up, encourage fluids.   12.  Tremors.  Patient was on sotalol at home and currently on hold 13.  Hypertension.   Continue Lasix to 10 mg daily given frequent urination.   11/2-  BP controlled- con't regimen 14.  Right epididymitis.  Resolved.  Completed course of doxycycline.  Follow-up urology services as needed 15.  Left knee arthritis/effusion.  Responded well to steroid injection. 16.  History of alcohol tobacco abuse.  NicoDerm patch.  Provide counseling 17.  Anasarca.  Decreased nutritional storage.  Marinol added for appetite stimulant 18.  Tobacco abuse.  NicoDerm patch.  Counseling 19. Insomnia: start Rozarem HS. Resolved. 20. Suboptimal magnesium levels: d/c colace and started magnesium gluconate 212m HS.  21. Vitamin D deficiency: level 14 on 10/20. Ergocalciferol 50,000U once per week for 7 weeks ordered. 22. Inadequate oral intake: resolved. D/ced TPN. 23.  Hyponatremia  Sodium 130 on 10/24,   10/31 131 24. R knee pain- limiting ROM, mobility now  11/2 xray demonstrates effusion, ESR elevated but lower than September reading, UA normal range  -pain didn't respond to po prednisone  -received steroid injection 10/27 without relief  -will ask ortho for input, aspiration 25. New leukocytosis: Resolved  U/A and Cx and CXR neg 27. Skin rash  10/27- assessed with DLinna Hoffand JAnderson [place] doesn't appear to be scabies-  wil do prednisone 40 mg x 3 days and add menthol cream prn and eucerin BID.   10/28- itching better- might also need cortisone cream- will monitor  10/29- itching and scratching amount much bette-r less excoriation- is healing- con't regimen        LOS: 14 days A FACE TO FACE EVALUATION WAS PERFORMED  ZMeredith Staggers11/07/2020, 8:17 AM

## 2021-04-21 LAB — GLUCOSE, CAPILLARY: Glucose-Capillary: 127 mg/dL — ABNORMAL HIGH (ref 70–99)

## 2021-04-21 NOTE — Progress Notes (Signed)
Patient ID: Travis Conrad, male   DOB: 30-Sep-1950, 70 y.o.   MRN: [id number] Follow up with the patient regarding pending discharge. Patient given supplies for ostomy care and abd incision dressing changes. Wife has reported that they have received additional ostomy care supplies at the home. Patient noted he had felt ready for discharge however given limited activity over the past week and treatment of knee pain yesterday; he is apprehensive about the discharge. Waiting on wife to come in to pick him up. Reported he felt ok getting up and transferring to a wheelchair however is not sure he will be up and walking around any time soon. Discussed short distance moves, to the bathroom to urinate or sink and he noted he plans to continue to use the urinal. Patient will have [facility] follow up services for PT, OT and a RN. Margarito Liner

## 2021-04-21 NOTE — Progress Notes (Signed)
INPATIENT REHABILITATION DISCHARGE NOTE   Discharge instructions by: Linna Hoff, PA-C  Verbalized understanding:yes  Skin care/Wound care healing? Midline incision healing instructions on wound care given to wife   Pain: knee pain 4/10  IV's: no IV's   Tubes/Drains: none  O2: room air   Safety instructions:given to patient and wife   Patient belongings: taken home by wife   Discharged BM:ZTAE   Discharged via: wheelchair   Notes:

## 2021-04-21 NOTE — Progress Notes (Signed)
PROGRESS NOTE   Subjective/Complaints:  Right knee aspirated yesterday. Still tender but seems better. Apprehensive about ambulation, wb [name]  ROS: Patient denies fever, rash, sore throat, blurred vision, nausea, vomiting, diarrhea, cough, shortness of breath or chest pain,  headache, or mood change.     Objective:   DG Knee 1-2 Views Right  Result Date: 04/19/2021 CLINICAL DATA:  Lateral knee pain over the last few days. No specific injury. EXAM: RIGHT KNEE - 1-2 VIEW COMPARISON:  None. FINDINGS: Moderate knee joint effusion. No weight-bearing compartment joint space narrowing or osteophyte formation. Patellofemoral compartment appears unremarkable. No focal bone lesion. Regional arterial calcification is noted. IMPRESSION: Joint effusion. No evidence of joint space narrowing, osteophyte formation or focal lesion otherwise. Regional arterial calcification. Electronically Signed   By: Nelson Chimes M.D.   On: 04/19/2021 14:12   No results for input(s): WBC, HGB, HCT, PLT in the last 72 hours.   No results for input(s): NA, K, CL, CO2, GLUCOSE, BUN, CREATININE, CALCIUM in the last 72 hours.    Intake/Output Summary (Last 24 hours) at 04/21/2021 0923 Last data filed at 04/20/2021 2142 Gross per 24 hour  Intake 120 ml  Output 100 ml  Net 20 ml        Physical Exam: Vital Signs Blood pressure 111/68, pulse 93, temperature 97.9 F (36.6 C), resp. rate 16, height 6' (1.829 m), weight 81.1 kg, SpO2 98 %.  Constitutional: No distress . Vital signs reviewed. HEENT: NCAT, EOMI, oral membranes moist Neck: supple Cardiovascular: RRR without murmur. No JVD    Respiratory/Chest: CTA Bilaterally without wheezes or rales. Normal effort    GI/Abdomen: BS +, non-tender, non-distended, ostomy Ext: no clubbing, cyanosis, or edema Psych: anxious  Skin- warm, dry Musc: right knee with decreased effusion. Still tender laterally Neuro:  Alert Makes eye contact with examiner.   Follows commands.   Motor:  [name]: HF, KE 2+-3/5 remains limited by pain but better when distracted [name]: 3/5 proximal to distal  Assessment/Plan: 1. Functional deficits which require 3+ hours per day of interdisciplinary therapy in a comprehensive inpatient rehab setting. Physiatrist is providing close team supervision and 24 hour management of active medical problems listed below. Physiatrist and rehab team continue to assess barriers to discharge/monitor patient progress toward functional and medical goals  Care Tool:  Bathing    Body parts bathed by patient: Left upper leg, Right upper leg, Front perineal area, Left arm, Right arm, Face, Chest, Buttocks, Right lower leg, Left lower leg (except for feet)   Body parts bathed by helper: Right lower leg, Left lower leg Body parts n/a: Abdomen   Bathing assist Assist Level: Supervision/Verbal cueing     Upper Body Dressing/Undressing Upper body dressing   What is the patient wearing?: Pull over shirt    Upper body assist Assist Level: Set up assist    Lower Body Dressing/Undressing Lower body dressing      What is the patient wearing?: Pants, Incontinence brief     Lower body assist Assist for lower body dressing: Supervision/Verbal cueing     Toileting Toileting Toileting Activity did not occur (Clothing management and hygiene only): N/A (no void or bm) (  foley and ostomy)  Toileting assist Assist for toileting: Supervision/Verbal cueing (urinal)     Transfers Chair/bed transfer  Transfers assist  Chair/bed transfer activity did not occur: Safety/medical concerns (Fatigue, agitation, pain)  Chair/bed transfer assist level: Supervision/Verbal cueing     Locomotion Ambulation   Ambulation assist      Assist level: Supervision/Verbal cueing Assistive device: Walker-rolling Max distance: 40'   Walk 10 feet activity   Assist     Assist level: Supervision/Verbal  cueing Assistive device: Walker-rolling   Walk 50 feet activity   Assist Walk 50 feet with 2 turns activity did not occur: Safety/medical concerns (Pain, agitation)         Walk 150 feet activity   Assist Walk 150 feet activity did not occur: Safety/medical concerns (Pain, agitation)         Walk 10 feet on uneven surface  activity   Assist Walk 10 feet on uneven surfaces activity did not occur: Safety/medical concerns (Pain, agitation)         Wheelchair     Assist Is the patient using a wheelchair?: Yes Type of Wheelchair: Manual    Wheelchair assist level: Set up assist, Supervision/Verbal cueing Max wheelchair distance: 150    Wheelchair 50 feet with 2 turns activity    Assist        Assist Level: Supervision/Verbal cueing   Wheelchair 150 feet activity     Assist      Assist Level: Supervision/Verbal cueing   Blood pressure 111/68, pulse 93, temperature 97.9 F (36.6 C), resp. rate 16, height 6' (1.829 m), weight 81.1 kg, SpO2 98 %.   Medical Problem List and Plan: 1.  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  Antithrombotics: -DVT/anticoagulation:  Pharmaceutical: Other (comment) Eliquis             -antiplatelet therapy: N/A 3. Pain: continue Lidoderm patch, Robaxin 1000 mg 4 times daily, oxycodone as needed   4. Mood: Ativan 1 mg every 6 hours as needed             -antipsychotic agents: N/A 5. Neuropsych: This patient is capable of making decisions on his own behalf. 6. Skin/Wound Care: Routine skin checks/colostomy care  10/30- pt refusing to do ANY colostomy care- will increase bowel meds and give Sorbitol x 60cc today and extra dose of miralax- since blew seal, was so constipated.    senna-s  bid, continue  mg++ 7. Fluids/Electrolytes/Nutrition:   8.  Postoperative anemia.     Hemoglobin 9.1 on 10/25  10/31 10.3 9.  Paroxysmal atrial fibrillation.  Continue Amiodarone 200 mg daily, Cardizem 60 mg every 6 hours.  Follow-up cardiology services  In reg rhythm 10.  VRE UTI.  Completed course of Zyvox 04/04/2021.  Contact precautions. 11.  AKI.  BUN up, encourage fluids.   12.  Tremors.  Patient was on sotalol at home and currently on hold 13.  Hypertension.   Continue Lasix to 10 mg daily given frequent urination.   11/3  BP controlled- con't regimen 14.  Right epididymitis.  Resolved.  Completed course of doxycycline.  Follow-up urology services as needed 15.  Left knee arthritis/effusion.  Responded well to steroid injection. 16.  History of alcohol tobacco abuse.  NicoDerm patch.  Provide counseling 17.  Anasarca.  Decreased nutritional storage.  Marinol added for appetite stimulant 18.  Tobacco abuse.  NicoDerm patch.  Counseling 19. Insomnia: start Rozarem HS. Resolved. 20. Suboptimal magnesium levels: d/c colace and started magnesium gluconate 250mg  HS.  21. Vitamin D deficiency: level 14 on 10/20. Ergocalciferol 50,000U once per week for 7 weeks ordered. 22. Inadequate oral intake: resolved. D/ced TPN. 23.  Hyponatremia  Sodium 130 on 10/24,   10/31 131 24. R knee OA with effusion--s/p aspiration  -aspirate unremarkable, cx pending  -appreciate ortho assist  -outpt f/u with orthopedics recommended  -[name] strengthening exercises  -pain control. 25. New leukocytosis: Resolved  U/A and Cx and CXR neg 27. Skin rash  resolved        LOS: 15 days A FACE TO FACE EVALUATION WAS PERFORMED  Meredith Staggers 04/21/2021, 9:23 AM

## 2021-04-21 NOTE — Plan of Care (Signed)
  Problem: RH Ambulation Goal: LTG Patient will ambulate in controlled environment (PT) Description: LTG: Patient will ambulate in a controlled environment, # of feet with assistance (PT). Outcome: Adequate for Discharge Goal: LTG Patient will ambulate in home environment (PT) Description: LTG: Patient will ambulate in home environment, # of feet with assistance (PT). Outcome: Adequate for Discharge   Problem: RH Balance Goal: LTG Patient will maintain dynamic standing balance (PT) Description: LTG:  Patient will maintain dynamic standing balance with assistance during mobility activities (PT) Outcome: Completed/Met   Problem: Sit to Stand Goal: LTG:  Patient will perform sit to stand with assistance level (PT) Description: LTG:  Patient will perform sit to stand with assistance level (PT) Outcome: Completed/Met   Problem: RH Bed Mobility Goal: LTG Patient will perform bed mobility with assist (PT) Description: LTG: Patient will perform bed mobility with assistance, with/without cues (PT). Outcome: Completed/Met   Problem: RH Bed to Chair Transfers Goal: LTG Patient will perform bed/chair transfers w/assist (PT) Description: LTG: Patient will perform bed to chair transfers with assistance (PT). Outcome: Completed/Met   Problem: RH Car Transfers Goal: LTG Patient will perform car transfers with assist (PT) Description: LTG: Patient will perform car transfers with assistance (PT). Outcome: Completed/Met   Problem: RH Furniture Transfers Goal: LTG Patient will perform furniture transfers w/assist (OT/PT) Description: LTG: Patient will perform furniture transfers  with assistance (OT/PT). Outcome: Completed/Met   Problem: RH Wheelchair Mobility Goal: LTG Patient will propel w/c in controlled environment (PT) Description: LTG: Patient will propel wheelchair in controlled environment, # of feet with assist (PT) Outcome: Completed/Met   Problem: RH Memory Goal: LTG Patient will  demonstrate ability for day to day recall/carry over during activities of daily living with assistance level (PT) Description: LTG:  Patient will demonstrate ability for day to day recall/carry over during activities of daily living with assistance level (PT). Outcome: Completed/Met

## 2021-04-22 ENCOUNTER — Encounter: Payer: Self-pay | Admitting: Physical Medicine and Rehabilitation

## 2021-04-23 LAB — BODY FLUID CULTURE W GRAM STAIN
Culture: NO GROWTH
Gram Stain: NONE SEEN

## 2021-04-25 ENCOUNTER — Telehealth: Payer: Self-pay | Admitting: Internal Medicine

## 2021-04-25 NOTE — Telephone Encounter (Signed)
Spoke with pt's wife and was wanting an appt the patient was hospitalized for 2 months and medications have been changed  and was only given 1 mo supply Appt made with Dr [name] for 05/26/21 at 10:40 am

## 2021-04-25 NOTE — Telephone Encounter (Signed)
  Pt's wife said when pt was released from the hospital almost all of her meds was changed, she would like to discuss it with the nurse

## 2021-04-28 ENCOUNTER — Telehealth (HOSPITAL_COMMUNITY): Payer: Self-pay | Admitting: Pharmacist

## 2021-04-28 ENCOUNTER — Other Ambulatory Visit (HOSPITAL_COMMUNITY): Payer: Self-pay

## 2021-04-28 NOTE — Telephone Encounter (Signed)
LVM

## 2021-05-02 ENCOUNTER — Telehealth (HOSPITAL_COMMUNITY): Payer: Self-pay | Admitting: Pharmacist

## 2021-05-02 NOTE — Telephone Encounter (Signed)
2nd attempt to reach

## 2021-05-03 ENCOUNTER — Inpatient Hospital Stay: Payer: Medicare Other | Admitting: Physical Medicine and Rehabilitation

## 2021-05-03 ENCOUNTER — Other Ambulatory Visit (HOSPITAL_COMMUNITY): Payer: Self-pay

## 2021-05-04 ENCOUNTER — Telehealth (HOSPITAL_COMMUNITY): Payer: Self-pay | Admitting: Pharmacist

## 2021-05-04 NOTE — Telephone Encounter (Signed)
Three attempts

## 2021-05-16 ENCOUNTER — Other Ambulatory Visit: Payer: Self-pay

## 2021-05-16 ENCOUNTER — Telehealth: Payer: Self-pay

## 2021-05-16 MED ORDER — APIXABAN 5 MG PO TABS: 5.0000 mg | ORAL_TABLET | Freq: Two times a day (BID) | ORAL | 0 refills | Status: DC

## 2021-05-16 NOTE — Telephone Encounter (Signed)
Spoke with the pts wife, Jimmi at [phone] re: moving his appt with [name] for 05/26/21 and she requests for him to see Dr. [name].. she says she was told that who he need to see when he was on the hospital... I made him an appt with A Tillery PA for 05/25/21 but I urged her to bring him in this week re: med changes and need for refills but she declined... he just gad GI surgery and is on need of more time to rest according to her... I sent in his Eliquis so no gap in treatment..she will call back if she feels he needs to come in sooner and she says they have the rest of his meds for now.   I will also forward to the [facility] that has been trying to reach them since he was D/C from the hosp.

## 2021-05-17 DIAGNOSIS — E7800 Pure hypercholesterolemia, unspecified: Secondary | ICD-10-CM

## 2021-05-17 DIAGNOSIS — G250 Essential tremor: Secondary | ICD-10-CM

## 2021-05-17 DIAGNOSIS — Z9181 History of falling: Secondary | ICD-10-CM

## 2021-05-17 DIAGNOSIS — Z8619 Personal history of other infectious and parasitic diseases: Secondary | ICD-10-CM

## 2021-05-17 DIAGNOSIS — I10 Essential (primary) hypertension: Secondary | ICD-10-CM | POA: Diagnosis not present

## 2021-05-17 DIAGNOSIS — F4323 Adjustment disorder with mixed anxiety and depressed mood: Secondary | ICD-10-CM

## 2021-05-17 DIAGNOSIS — F1010 Alcohol abuse, uncomplicated: Secondary | ICD-10-CM

## 2021-05-17 DIAGNOSIS — M25461 Effusion, right knee: Secondary | ICD-10-CM

## 2021-05-17 DIAGNOSIS — Z48815 Encounter for surgical aftercare following surgery on the digestive system: Secondary | ICD-10-CM | POA: Diagnosis not present

## 2021-05-17 DIAGNOSIS — Z8719 Personal history of other diseases of the digestive system: Secondary | ICD-10-CM

## 2021-05-17 DIAGNOSIS — E871 Hypo-osmolality and hyponatremia: Secondary | ICD-10-CM

## 2021-05-17 DIAGNOSIS — Z87440 Personal history of urinary (tract) infections: Secondary | ICD-10-CM

## 2021-05-17 DIAGNOSIS — M1712 Unilateral primary osteoarthritis, left knee: Secondary | ICD-10-CM

## 2021-05-17 DIAGNOSIS — R21 Rash and other nonspecific skin eruption: Secondary | ICD-10-CM

## 2021-05-17 DIAGNOSIS — E559 Vitamin D deficiency, unspecified: Secondary | ICD-10-CM

## 2021-05-17 DIAGNOSIS — I480 Paroxysmal atrial fibrillation: Secondary | ICD-10-CM

## 2021-05-17 DIAGNOSIS — Z7901 Long term (current) use of anticoagulants: Secondary | ICD-10-CM

## 2021-05-17 DIAGNOSIS — D62 Acute posthemorrhagic anemia: Secondary | ICD-10-CM | POA: Diagnosis not present

## 2021-05-17 DIAGNOSIS — F17210 Nicotine dependence, cigarettes, uncomplicated: Secondary | ICD-10-CM

## 2021-05-17 DIAGNOSIS — Z433 Encounter for attention to colostomy: Secondary | ICD-10-CM | POA: Diagnosis not present

## 2021-05-17 DIAGNOSIS — M25462 Effusion, left knee: Secondary | ICD-10-CM

## 2021-05-17 MED ORDER — AMIODARONE HCL 200 MG PO TABS: 200.0000 mg | ORAL_TABLET | Freq: Every day | ORAL | 0 refills | Status: DC

## 2021-05-24 NOTE — Progress Notes (Signed)
PCP:  Pcp, No Primary Cardiologist: [name], MD Electrophysiologist: [name], MD   Travis Conrad is a 70 y.o. male seen today for [name], MD for routine electrophysiology followup.   Had complicated admission 9/3 -> 04/06/2021. In the ED he was found to have BP of 84/65, heart rate 57, respiration 20/min, O2 sats 91%.  CT abdomen/pelvis showed acute sigmoid diverticulitis with small extraluminal gas and fluid collection in the sigmoid mesocolon, 3.5 x 1.5 x 2.7 cm consistent with perforation and abscess.  Dilated proximal and decompressed distal small bowel loops, possible ileus. 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  Developed postop ileus Urology and ID were consulted for epididymitis 9/19 developed postop anemia, required 1 unit PRBC Positive left knee pain, positive effusion, underwent left knee aspiration injection with good toleration, fluid showed no crystals, gram stain with no organisms 9/26, NG tube was removed 9/27 patient had vomiting abdominal distention, x-ray showed recurrent ileus, NG tube was replaced and connected to intermittent suction 10/6 patient started on clear liquid diet, diet is being advanced slowly  Went to CIR and discharged 04/21/2021   Since discharge patient reports doing well.  Sotalol was changed out for amiodarone during the acute phase of his illness above. He states he has not felt any AF breakthroughs since discharge. He had not felt them since healing from his ablation prior to this admission. Currently, he denies chest pain, palpitations, dyspnea, PND, orthopnea, nausea, vomiting, dizziness, syncope, edema, weight gain, or early  satiety.  Past Medical History:  Diagnosis Date   Atrial fibrillation ([place])    Dysrhythmia    a-fib   High cholesterol    "RX made groin hurt" (08/14/2016)   Hypertension    Past Surgical History:  Procedure Laterality Date   ANKLE FRACTURE SURGERY Left ~ 2012   "put rod and pins in"   ATRIAL FIBRILLATION ABLATION N/A 10/03/2019   Procedure: ATRIAL FIBRILLATION ABLATION;  Surgeon: Constance Haw, MD;  Location: [facility];  Service: Cardiovascular;  Laterality: N/A;   COLECTOMY N/A 02/26/2021   Procedure: PARTIAL COLECTOMY AND COLOSTOMY;  Surgeon: Kieth Brightly, Arta Bruce, MD;  Location: [facility];  Service: General;  Laterality: N/A;   COLONOSCOPY W/ BIOPSIES AND POLYPECTOMY  ~ 2000   FRACTURE SURGERY     LAPAROTOMY  02/26/2021   Procedure: EXPLORATORY LAPAROTOMY;  Surgeon: Mickeal Skinner, MD;  Location: [facility];  Service: General;;   ORIF FIBULA FRACTURE  06/24/2012   Procedure: OPEN REDUCTION INTERNAL FIXATION (ORIF) FIBULA FRACTURE;  Surgeon: [name], MD;  Location: [place];  Service: Orthopedics;  Laterality: Left;  ORIF LEFT FIBULA,  POSSIBLE REPAIR OF SYNDESMOSIS    TEE WITHOUT CARDIOVERSION N/A 10/03/2019   Procedure: TRANSESOPHAGEAL ECHOCARDIOGRAM (TEE);  Surgeon: Constance Haw, MD;  Location: [facility];  Service: Cardiovascular;  Laterality: N/A;    Current Outpatient Medications  Medication Sig Dispense Refill   amiodarone (PACERONE) 200 MG tablet Take 1 tablet (200 mg total) by mouth daily. 30 tablet 0   apixaban (ELIQUIS) 5 MG TABS tablet Take 1 tablet (5 mg total) by mouth 2 (two) times daily. 60 tablet 0   diltiazem (CARDIZEM) 60 MG tablet Take 1 tablet (60 mg total) by mouth every  6 (six) hours. 120 tablet 0   docusate sodium (COLACE) 100 MG capsule Take 1 capsule (100 mg total) by mouth 2 (two) times daily. 10 capsule 0   magnesium oxide (MAG-OX) 400 MG tablet Take 1/2 tablet (200 mg total) by mouth at bedtime. 30 tablet 0    methocarbamol (ROBAXIN) 500 MG tablet Take 2 tablets (1,000 mg total) by mouth 4 (four) times daily. 240 tablet 0   nitroGLYCERIN (NITROSTAT) 0.4 MG SL tablet Place 1 tablet (0.4 mg total) under the tongue every 5 (five) minutes as needed for chest pain. 25 tablet 0   ramelteon (ROZEREM) 8 MG tablet Take 1 tablet (8 mg total) by mouth at bedtime. 30 tablet 0   No current facility-administered medications for this visit.    Allergies  Allergen Reactions   Atorvastatin Other (See Comments)    myalgias    Social History   Socioeconomic History   Marital status: Married    Spouse name: Not on file   Number of children: Not on file   Years of education: Not on file   Highest education level: Not on file  Occupational History   Not on file  Tobacco Use   Smoking status: Every Day    Packs/day: 1.00    Years: 50.00    Pack years: 50.00    Types: Cigarettes   Smokeless tobacco: Never   Tobacco comments:    1/2-1ppd  Vaping Use   Vaping Use: Never used  Substance and Sexual Activity   Alcohol use: Not Currently    Comment: 1/2 gallon of whiskey a week   Drug use: Yes    Types: Cocaine, Marijuana    Comment: 08/14/2016 "last cocaine was in early 1990s; smoked pot yesterday"   Sexual activity: Not Currently  Other Topics Concern   Not on file  Social History Narrative   Not on file   Social Determinants of Health   Financial Resource Strain: Not on file  Food Insecurity: Not on file  Transportation Needs: Not on file  Physical Activity: Not on file  Stress: Not on file  Social Connections: Not on file  Intimate Partner Violence: Not on file     Review of Systems: All other systems reviewed and are otherwise negative except as noted above.  Physical Exam: Vitals:   05/25/21 1018  BP: 114/62  Pulse: 83  SpO2: 97%  Weight: 170 lb (77.1 kg)  Height: 6' (1.829 m)    GEN- The patient is well appearing, alert and oriented x 3 today.   HEENT: normocephalic,  atraumatic; sclera clear, conjunctiva pink; hearing intact; oropharynx clear; neck supple, no JVP Lymph- no cervical lymphadenopathy Lungs- Clear to ausculation bilaterally, normal work of breathing.  No wheezes, rales, rhonchi Heart- Regular rate and rhythm, no murmurs, rubs or gallops, PMI not laterally displaced GI- soft, non-tender, non-distended, bowel sounds present, no hepatosplenomegaly Extremities- no clubbing, cyanosis, or edema; DP/PT/radial pulses 2+ bilaterally MS- no significant deformity or atrophy Skin- warm and dry, no rash or lesion Psych- euthymic mood, full affect Neuro- strength and sensation are intact  EKG is ordered. Personal review today shows NSR at 77 bpm.   Additional studies reviewed include: Previous EP and admission notes.   Assessment and Plan:  1.  Paroxysmal atrial fibrillation:  Currently on Xarelto, diltiazem, amiodarone  CHA2DS2-VASc of 2.  Status post AF ablation 10/03/2018. Continue amiodarone 200 mg daily. Surveillance labs today. We discussed risks of long term amiodarone use. With overall quiescent AF  post ablation, could consider bringing off amiodarone and following. Would prefer to keep for now to let him get even further out from his admission.    2. HTN Stable on current regimen   Follow up with Dr. [name] in 3 months to consider stopping amiodarone and monitoring rhythm off AADs.   Shirley Friar, PA-C  05/25/21 10:31 AM

## 2021-05-25 ENCOUNTER — Other Ambulatory Visit: Payer: Self-pay

## 2021-05-25 ENCOUNTER — Ambulatory Visit: Payer: Medicare Other | Admitting: Student

## 2021-05-25 ENCOUNTER — Encounter: Payer: Self-pay | Admitting: Student

## 2021-05-25 VITALS — BP 114/62 | HR 83 | Ht 72.0 in | Wt 170.0 lb

## 2021-05-25 DIAGNOSIS — I10 Essential (primary) hypertension: Secondary | ICD-10-CM

## 2021-05-25 DIAGNOSIS — I480 Paroxysmal atrial fibrillation: Secondary | ICD-10-CM | POA: Diagnosis not present

## 2021-05-25 LAB — COMPREHENSIVE METABOLIC PANEL
ALT: 9 IU/L (ref 0–44)
AST: 15 IU/L (ref 0–40)
Albumin/Globulin Ratio: 1.3 (ref 1.2–2.2)
Albumin: 3.5 g/dL — ABNORMAL LOW (ref 3.8–4.8)
Alkaline Phosphatase: 194 IU/L — ABNORMAL HIGH (ref 44–121)
BUN/Creatinine Ratio: 12 (ref 10–24)
BUN: 13 mg/dL (ref 8–27)
Bilirubin Total: <0.2 mg/dL (ref 0.0–1.2)
CO2: 26 mmol/L (ref 20–29)
Calcium: 9.4 mg/dL (ref 8.6–10.2)
Chloride: 97 mmol/L (ref 96–106)
Creatinine, Ser: 1.06 mg/dL (ref 0.76–1.27)
Globulin, Total: 2.7 g/dL (ref 1.5–4.5)
Glucose: 105 mg/dL — ABNORMAL HIGH (ref 70–99)
Potassium: 4.5 mmol/L (ref 3.5–5.2)
Sodium: 138 mmol/L (ref 134–144)
Total Protein: 6.2 g/dL (ref 6.0–8.5)
eGFR: 75 mL/min/{1.73_m2} (ref >59)

## 2021-05-25 LAB — TSH: TSH: 3.78 u[IU]/mL (ref 0.450–4.500)

## 2021-05-25 LAB — CBC
Hematocrit: 41.8 % (ref 37.5–51.0)
Hemoglobin: 13.7 g/dL (ref 13.0–17.7)
MCH: 26.2 pg — ABNORMAL LOW (ref 26.6–33.0)
MCHC: 32.8 g/dL (ref 31.5–35.7)
MCV: 80 fL (ref 79–97)
Platelets: 438 10*3/uL (ref 150–450)
RBC: 5.23 x10E6/uL (ref 4.14–5.80)
RDW: 15.7 % — ABNORMAL HIGH (ref 11.6–15.4)
WBC: 14 10*3/uL — ABNORMAL HIGH (ref 3.4–10.8)

## 2021-05-25 NOTE — Patient Instructions (Signed)
Medication Instructions:  Your physician recommends that you continue on your current medications as directed. Please refer to the Current Medication list given to you today.  *If you need a refill on your cardiac medications before your next appointment, please call your pharmacy*   Lab Work: TODAY: CMET, CBC, TSH  If you have labs (blood work) drawn today and your tests are completely normal, you will receive your results only by: [place] (if you have MyChart) OR A paper copy in the mail If you have any lab test that is abnormal or we need to change your treatment, we will call you to review the results.   Follow-Up: At [facility], you and your health needs are our priority.  As part of our continuing mission to provide you with exceptional heart care, we have created designated Provider Care Teams.  These Care Teams include your primary Cardiologist (physician) and Advanced Practice Providers (APPs -  Physician Assistants and Nurse Practitioners) who all work together to provide you with the care you need, when you need it.  Your next appointment:   3 month(s)  The format for your next appointment:   In Person  Provider:   You may see [name], MD or one of the following Advanced Practice Providers on your designated Care Team:   Tommye Standard, [place] Legrand Como "[facility]" [place], [place]

## 2021-05-26 ENCOUNTER — Ambulatory Visit: Payer: Medicare Other | Admitting: Internal Medicine

## 2021-06-08 NOTE — Addendum Note (Signed)
Addended by: Drue Novel I on: 06/08/2021 03:32 PM   Modules accepted: Orders

## 2021-06-16 NOTE — Addendum Note (Signed)
Addended by: Maren Beach, Terrye Dombrosky A on: 06/16/2021 09:27 AM   Modules accepted: Orders

## 2021-06-17 ENCOUNTER — Other Ambulatory Visit: Payer: Self-pay | Admitting: Cardiology

## 2021-06-17 ENCOUNTER — Other Ambulatory Visit: Payer: Self-pay

## 2021-06-17 ENCOUNTER — Other Ambulatory Visit: Payer: Self-pay | Admitting: Internal Medicine

## 2021-06-17 MED ORDER — DILTIAZEM HCL ER COATED BEADS 120 MG PO CP24: 120.0000 mg | ORAL_CAPSULE | Freq: Every day | ORAL | 1 refills | Status: DC

## 2021-06-17 MED ORDER — APIXABAN 5 MG PO TABS
ORAL_TABLET | ORAL | 6 refills | Status: DC
Start: 2021-06-17 — End: 2021-09-22

## 2021-06-17 MED ORDER — AMIODARONE HCL 200 MG PO TABS: 200.0000 mg | ORAL_TABLET | Freq: Every day | ORAL | 3 refills | Status: DC

## 2021-06-17 NOTE — Telephone Encounter (Signed)
Eliquis 5 mg refill request received. Patient is 70 years old, weight- 77.1 kg, Crea- 1.06 on 05/25/21 , Diagnosis- PAF, and last seen by Dr. Chalmers Cater on 05/25/21. Dose is appropriate based on dosing criteria. Will send in refill to requested pharmacy.

## 2021-06-17 NOTE — Telephone Encounter (Signed)
Spoke to pt's Wife. He is currently taking Diltiazem 60mg  twice daily. I explained to her that per AT I was sending in a new RX for Diltiazem 120mg  ER.

## 2021-06-17 NOTE — Telephone Encounter (Signed)
Eliquis 5 mg refill request received. Patient is 70 years old, weight-77.1 kg, Crea- 1.06 on 05/25/21, Diagnosis-PAF, and last seen by Dr. Chalmers Cater on 05/25/21. Dose is appropriate based on dosing criteria. Will send in refill to requested pharmacy.

## 2021-06-17 NOTE — Addendum Note (Signed)
Addended by: Dede Query R on: 06/17/2021 02:32 PM   Modules accepted: Orders

## 2021-08-05 ENCOUNTER — Ambulatory Visit (HOSPITAL_COMMUNITY)
Admission: RE | Admit: 2021-08-05 | Discharge: 2021-08-05 | Disposition: A | Discharge status: Home / Self Care | Payer: Medicare Other | Source: Ambulatory Visit | Attending: Nurse Practitioner | Admitting: Nurse Practitioner

## 2021-08-05 DIAGNOSIS — L259 Unspecified contact dermatitis, unspecified cause: Secondary | ICD-10-CM | POA: Insufficient documentation

## 2021-08-05 DIAGNOSIS — K9400 Colostomy complication, unspecified: Secondary | ICD-10-CM | POA: Diagnosis not present

## 2021-08-05 DIAGNOSIS — L24B1 Irritant contact dermatitis related to digestive stoma or fistula: Secondary | ICD-10-CM | POA: Diagnosis not present

## 2021-08-05 DIAGNOSIS — Z433 Encounter for attention to colostomy: Secondary | ICD-10-CM | POA: Diagnosis present

## 2021-08-05 DIAGNOSIS — R21 Rash and other nonspecific skin eruption: Secondary | ICD-10-CM | POA: Diagnosis not present

## 2021-08-05 DIAGNOSIS — K435 Parastomal hernia without obstruction or  gangrene: Secondary | ICD-10-CM | POA: Diagnosis not present

## 2021-08-05 NOTE — Discharge Instructions (Signed)
See back Tuesday 10:30 Today, we are switching to Coloplast 1 piece convex pouch Use fungal powder and skin prep to skin around stoma Barrier ring Dry powder shave to abdominal hair as needed.  Recommended:  Generic FLonase spray.  2 squirts to irritated skin. Allow to dry for 30 seconds to a minute Fungal powder next Seal in with skin prep Barrier ring and pouch Add barrier strips to perimeter If pouch is too uncomfortable can switch back to [place] but protect skin

## 2021-08-05 NOTE — Progress Notes (Signed)
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   Skin:  Positive for rash.       pMARSI beneath pouch noted today  (See photo)  Has been lifting barrier to scratch beneath pouch  All other systems reviewed and are negative. Vital signs:  Pulse 86    Temp 97.9 F (36.6 C) (Oral)    Resp 18    SpO2 94%  Exam:  Physical Exam Vitals reviewed.  Constitutional:      Appearance: Normal appearance.  Cardiovascular:     Rate and Rhythm: Normal rate and regular rhythm.  Pulmonary:     Effort: Pulmonary effort is normal.     Breath sounds: Normal breath sounds.  Abdominal:     Palpations: Abdomen is soft.     Comments: LLQ colostomy with parastomal hernia and contact dermatitis Midline surgical incision, healed  Skin:    Findings: Rash present.     Comments: Contact dermatitis to peristomal skin.  States he gets a skin flare up off and on.  Red patchy and itchy.  Present to posterior thighs today.   Neurological:     Mental Status: He is alert and oriented to person, place, and time. Mental status is at baseline.  Psychiatric:        Mood and Affect: Mood normal.        Behavior: Behavior normal.        Thought Content: Thought content normal.    Stoma type/location:  LLQ colostomy Stomal assessment/size:  1 3/8" round, slightly budded Peristomal assessment:  Midline abdominal wound has closed.  Scar remains but is not in pouching area. Rounded abdomen parastomal hernia Full thickness tissue loss peristomal medical adhesive related skin injury (PMaRSI)  See photo in chart. It is the pattern of adhesive to her Hollister pouch. This is  new onset. Has not had reaction before this.  It is red, weeping and itching.  No satellite lesions to suggest fungal overgrowth.  We will protect skin with powder and skin prep.  He will be in a 1piece convex coloplast pouch.   I have provided samples to try  Treatment options for stomal/peristomal skin: powder, skin prep, barrier ring.  Convex pouch with barrier strips and an ostomy belt.  I have recommended flonase spray to be applied to peristomal skin and demonstrated this. This topical steroid may be of benefit. Abdominal hair is clipped today too as this made pouch removal uncomfortable Output: soft brown stool Ostomy pouching: 1pc. Convex (see above)   Education provided:  See above   Wife provides all of ostomy care.  Patient states his vision is poor and he cannot perform  I have encouraged patient to attempt emptying with his wife assistance and begin to take a role in self care. He reluctantly agrees.  If wife is unable to care for pouch, she has a daughter out of town that could help as well.     Impression/dx  Contact dermatitis Parastomal hernia Discussion  New pouch,  pMARSI and ways to treat skin, stoma belt for hernia Plan  See back in 4 days to reassess  Visit time: 75 minutes.   Domenic Moras FNP-BC

## 2021-08-09 ENCOUNTER — Ambulatory Visit (HOSPITAL_COMMUNITY): Payer: Medicare Other

## 2021-08-12 ENCOUNTER — Ambulatory Visit (HOSPITAL_COMMUNITY)
Admission: RE | Admit: 2021-08-12 | Discharge: 2021-08-12 | Disposition: A | Discharge status: Home / Self Care | Payer: Medicare Other | Source: Ambulatory Visit | Attending: Nurse Practitioner | Admitting: Nurse Practitioner

## 2021-08-12 ENCOUNTER — Ambulatory Visit (HOSPITAL_COMMUNITY): Payer: Medicare Other

## 2021-08-12 ENCOUNTER — Other Ambulatory Visit: Payer: Self-pay

## 2021-08-12 DIAGNOSIS — L24B1 Irritant contact dermatitis related to digestive stoma or fistula: Secondary | ICD-10-CM | POA: Diagnosis not present

## 2021-08-12 DIAGNOSIS — L24B3 Irritant contact dermatitis related to fecal or urinary stoma or fistula: Secondary | ICD-10-CM | POA: Insufficient documentation

## 2021-08-12 DIAGNOSIS — K9409 Other complications of colostomy: Secondary | ICD-10-CM | POA: Diagnosis not present

## 2021-08-12 DIAGNOSIS — K9400 Colostomy complication, unspecified: Secondary | ICD-10-CM | POA: Diagnosis not present

## 2021-08-12 NOTE — Progress Notes (Signed)
[facility]   Reason for visit:  Reassess pMARSI and skin irritation.  Switched to 1piece convex pouch last visit. Wife did not use coloplast pouches.  Stated she did not like them as well.  She is hopeful the crusting with powder and skin prep has protected skin.  Has not worn ostomy belt  Patient states, "I havenot been very active"  "I will wear it if I  need it"  HPI:  Perforated diverticulitis with LLQ colostomy ROS  Review of Systems  Constitutional: Negative.   Eyes:  Positive for visual disturbance.       Has upcoming cataract removal.  States he cannot participate in care due to vision at this time.    Gastrointestinal:        Midline abdominal wound, healed with scar in close proximity to pouching area LLQ colostomy Contact dermatitis improving to pouching area (See photo)   Skin:  Positive for rash.       Itching to skin, chronic per wife.  On back and stomach, legs.  He sometimes lifts the pouch barrier to scratch beneath.  Skin has improved, see photos. pMARSI to outer perimeter of pouching area.  Improved.  Using stoma powder and skin prep Using barrier strips to pouch perimeter.   Allergic/Immunologic:       Chronic itching to skin  Psychiatric/Behavioral: Negative.    All other systems reviewed and are negative. Vital signs:  BP (!) 153/62 (BP Location: Right Arm)    Pulse 87    Temp 97.6 F (36.4 C) (Oral)    Resp 18    SpO2 98%  Exam:  Physical Exam Vitals reviewed.  Constitutional:      Appearance: Normal appearance. He is normal weight.  Eyes:     General: Visual field deficit present.  Neurological:     Mental Status: He is alert.    Stoma type/location:  LLQ colostomy Stomal assessment/size:  1 1/4" pink and moist Peristomal assessment:  peristomal Medical adhesive Related skin injury (pMARSI) present, improving with crusting Treatment options for stomal/peristomal skin: crusting with powder and skin prep. Barrier ring.  Has not used flonase.   Output: soft brown stool Ostomy pouching: 1pc.convex (Hollister) pouch with barrier ring and barrier strips.  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  Will call [facility] to update orders.     Impression/dx  Contact dermatitis colostomy Discussion  Ongoing pouching.  Minimize lifting pouch to scratch Plan  Update supply company Call clinic as needed    Visit time: 50 minutes.   Domenic Moras FNP-BC

## 2021-08-12 NOTE — Discharge Instructions (Signed)
Continue 1 piece Hollister convex pouch Update [facility]

## 2021-09-17 ENCOUNTER — Other Ambulatory Visit: Payer: Self-pay | Admitting: Student

## 2021-09-22 ENCOUNTER — Encounter: Payer: Self-pay | Admitting: Cardiology

## 2021-09-22 ENCOUNTER — Ambulatory Visit: Payer: Medicare Other | Admitting: Cardiology

## 2021-09-22 VITALS — BP 124/72 | HR 68 | Ht 72.0 in | Wt 201.4 lb

## 2021-09-22 DIAGNOSIS — R0602 Shortness of breath: Secondary | ICD-10-CM

## 2021-09-22 DIAGNOSIS — R609 Edema, unspecified: Secondary | ICD-10-CM

## 2021-09-22 MED ORDER — AMIODARONE HCL 200 MG PO TABS: 200.0000 mg | ORAL_TABLET | Freq: Every day | ORAL | 3 refills | Status: DC

## 2021-09-22 MED ORDER — DILTIAZEM HCL ER COATED BEADS 120 MG PO CP24: ORAL_CAPSULE | ORAL | 1 refills | Status: DC

## 2021-09-22 MED ORDER — APIXABAN 5 MG PO TABS: ORAL_TABLET | ORAL | 5 refills | Status: DC

## 2021-09-22 NOTE — Addendum Note (Signed)
Addended by: Stanton Kidney on: 09/22/2021 10:40 AM ? ? Modules accepted: Orders ? ?

## 2021-09-22 NOTE — Patient Instructions (Signed)
Medication Instructions:  ?Your physician recommends that you continue on your current medications as directed. Please refer to the Current Medication list given to you today. ? ?*If you need a refill on your cardiac medications before your next appointment, please call your pharmacy* ? ? ?Lab Work: ?Today: BNP ? ?If you have labs (blood work) drawn today and your tests are completely normal, you will receive your results only by: ?MyChart Message (if you have MyChart) OR ?A paper copy in the mail ?If you have any lab test that is abnormal or we need to change your treatment, we will call you to review the results. ? ? ?Testing/Procedures: ?None ordered ? ? ?Follow-Up: ?At [facility], you and your health needs are our priority.  As part of our continuing mission to provide you with exceptional heart care, we have created designated Provider Care Teams.  These Care Teams include your primary Cardiologist (physician) and Advanced Practice Providers (APPs -  Physician Assistants and Nurse Practitioners) who all work together to provide you with the care you need, when you need it. ? ?Your next appointment:   ?6 month(s) ? ?The format for your next appointment:   ?In Person ? ?Provider:   ?You will see one of the following Advanced Practice Providers on your designated Care Team:   ?Tommye Standard, PA-C ?Legrand Como "[facility]" [place], PA-C ? ? ? ?Thank you for choosing [facility]!! ? ? ?Trinidad Curet, RN ?[phone] ? ?

## 2021-09-22 NOTE — Progress Notes (Signed)
?  ?Electrophysiology Office Note ? ? ?Date:  09/22/2021  ? ?ID:  Travis Conrad, DOB September 21, 1950, MRN [id number] ? ?PCP:  Pcp, No  ?Cardiologist:  [name] ?Primary Electrophysiologist:  Waylen Depaolo [name], MD   ? ?Chief Complaint: AF ?  ?History of Present Illness: ?Travis Conrad is a 72 y.o. male who is being seen today for the evaluation of AF at the request of [name]. Presenting today for electrophysiology evaluation. ? ?He has a history significant for atrial fibrillation and hypertension.  He is status post ablation 10/03/2019.  After his ablation he continued to have short episodes of atrial fibrillation.  He was initially on sotalol but he has been since been switched to amiodarone. ? ?He had a complicated admission 02/21/7095 through 04/06/2021.  He was found to have a pelvic abscess and eventually underwent sigmoid colectomy and diverting colostomy.  He developed postop ileus.  He had knee pain and was found to have an effusion.  He was discharged to [facility] and discharged from [facility] 04/21/2021. ? ?Today, denies symptoms of palpitations, chest pain, shortness of breath, orthopnea, PND, lower extremity edema, claudication, dizziness, presyncope, syncope, bleeding, or neurologic sequela. The patient is tolerating medications without difficulties.  He is currently feeling well.  He has not noted any further episodes of atrial fibrillation.  He does state that he has gained some weight since last being seen and feels somewhat bloated.  Aside from that, he is continuing to be evaluated by general surgery for timing of ostomy reversal. ? ? ?Past Medical History:  ?Diagnosis Date  ? Atrial fibrillation ([place])   ? Dysrhythmia   ? a-fib  ? High cholesterol   ? "RX made groin hurt" (08/14/2016)  ? Hypertension   ? ?Past Surgical History:  ?Procedure Laterality Date  ? ANKLE FRACTURE SURGERY Left ~ 2012  ? "put rod and pins in"  ? ATRIAL FIBRILLATION ABLATION N/A 10/03/2019  ? Procedure: ATRIAL FIBRILLATION ABLATION;   Surgeon: Constance Haw, MD;  Location: [place] [facility];  Service: Cardiovascular;  Laterality: N/A;  ? COLECTOMY N/A 02/26/2021  ? Procedure: PARTIAL COLECTOMY AND COLOSTOMY;  Surgeon: Kinsinger, Arta Bruce, MD;  Location: [place];  Service: General;  Laterality: N/A;  ? COLONOSCOPY W/ BIOPSIES AND POLYPECTOMY  ~ 2000  ? FRACTURE SURGERY    ? LAPAROTOMY  02/26/2021  ? Procedure: EXPLORATORY LAPAROTOMY;  Surgeon: Kinsinger, Arta Bruce, MD;  Location: [place];  Service: General;;  ? ORIF FIBULA FRACTURE  06/24/2012  ? Procedure: OPEN REDUCTION INTERNAL FIXATION (ORIF) FIBULA FRACTURE;  Surgeon: [name], MD;  Location: [place];  Service: Orthopedics;  Laterality: Left;  ORIF LEFT FIBULA,  POSSIBLE REPAIR OF SYNDESMOSIS   ? TEE WITHOUT CARDIOVERSION N/A 10/03/2019  ? Procedure: TRANSESOPHAGEAL ECHOCARDIOGRAM (TEE);  Surgeon: Constance Haw, MD;  Location: [place] [facility];  Service: Cardiovascular;  Laterality: N/A;  ? ? ? ?Current Outpatient Medications  ?Medication Sig Dispense Refill  ? amiodarone (PACERONE) 200 MG tablet Take 1 tablet (200 mg total) by mouth daily. 90 tablet 3  ? apixaban (ELIQUIS) 5 MG TABS tablet TAKE 1 TABLET(5 MG) BY MOUTH TWICE DAILY 60 tablet 6  ? diltiazem (CARDIZEM CD) 120 MG 24 hr capsule TAKE 1 CAPSULE(120 MG) BY MOUTH DAILY 90 capsule 1  ? docusate sodium (COLACE) 100 MG capsule Take 1 capsule (100 mg total) by mouth 2 (two) times daily. 10 capsule 0  ? nitroGLYCERIN (NITROSTAT) 0.4 MG SL tablet Place 1 tablet (  0.4 mg total) under the tongue every 5 (five) minutes as needed for chest pain. 25 tablet 0  ? ?No current facility-administered medications for this visit.  ? ? ?Allergies:   Atorvastatin  ? ?Social History:  The patient  reports that he quit smoking about 7 months ago. His smoking use included cigarettes. He has a 50.00 pack-year smoking history. He has never used smokeless tobacco. He reports that he does not currently use alcohol. He reports current drug use.  Drugs: Cocaine and Marijuana.  ? ?Family History:  The patient's family history includes Alzheimer's disease in his brother; Cancer in his brother and brother; Heart disease in his paternal uncle, paternal uncle, paternal uncle, paternal uncle, paternal uncle, and paternal uncle; Parkinson's disease in his paternal uncle.  ? ?ROS:  Please see the history of present illness.   Otherwise, review of systems is positive for none.   All other systems are reviewed and negative.  ? ?PHYSICAL EXAM: ?VS:  BP 124/72   Pulse 68   Ht 6' (1.829 m)   Wt 201 lb 6.4 oz (91.4 kg)   SpO2 96%   BMI 27.31 kg/m?  , BMI Body mass index is 27.31 kg/m?. ?GEN: Well nourished, well developed, in no acute distress  ?HEENT: normal  ?Neck: no JVD, carotid bruits, or masses ?Cardiac: RRR; no murmurs, rubs, or gallops,no edema  ?Respiratory:  clear to auscultation bilaterally, normal work of breathing ?GI: soft, nontender, nondistended, + BS ?MS: no deformity or atrophy  ?Skin: warm and dry ?Neuro:  Strength and sensation are intact ?Psych: euthymic mood, full affect ? ?EKG:  EKG is ordered today. ?Personal review of the ekg ordered shows sinus rhythm, rate 68 ? ?Recent Labs: ?04/07/2021: Magnesium 2.0 ?05/25/2021: ALT 9; BUN 13; Creatinine, Ser 1.06; Hemoglobin 13.7; Platelets 438; Potassium 4.5; Sodium 138; TSH 3.780  ? ? ?Lipid Panel  ?   ?Component Value Date/Time  ? CHOL 102 09/30/2019 0959  ? TRIG 159 (H) 04/04/2021 0423  ? HDL 35 (L) 09/30/2019 0959  ? CHOLHDL 2.9 09/30/2019 0959  ? CHOLHDL 4.9 06/04/2015 1022  ? VLDL 33 (H) 06/04/2015 1022  ? [place] 32 09/30/2019 0959  ? ? ? ?Wt Readings from Last 3 Encounters:  ?09/22/21 201 lb 6.4 oz (91.4 kg)  ?05/25/21 170 lb (77.1 kg)  ?04/21/21 178 lb 12.7 oz (81.1 kg)  ?  ? ? ?Other studies Reviewed: ?Additional studies/ records that were reviewed today include: TTE 08/15/16  ?Review of the above records today demonstrates:  ?- Left ventricle: The cavity size was normal. Wall thickness was  ?   normal. Systolic function was normal. The estimated ejection  ?  fraction was in the range of 60% to 65%. Left ventricular  ?  diastolic function parameters were normal.  ?- Aortic valve: Trileaflet. Sclerosis without stenosis. There was  ?  mild regurgitation.  ?- Mitral valve: Mildly thickened leaflets . There was trivial  ?  regurgitation.  ?- Left atrium: Severely dilated.  ?- Right ventricle: The cavity size was normal. Systolic function is  ?  mildly reduced.  ?- Inferior vena cava: The vessel was normal in size. The  ?  respirophasic diameter changes were in the normal range (= 50%),  ?  consistent with normal central venous pressure. ? ?Cardiac Monitor 06/25/19 personally reviewed ?Sinus rhythm, atrial fibrllation, atrial flutter.   Symptoms of palpitations not associated with arrhythmias ? ?ASSESSMENT AND PLAN: ? ?1.  Paroxysmal atrial fibrillation: Currently on Eliquis 5 mg  twice daily, amiodarone 200 mg daily, diltiazem 120 mg daily.  High risk medication monitoring for amiodarone.  CHA2DS2-VASc of 2.  Is in sinus rhythm.  Most recent labs show no liver or thyroid issues.  He does have weight gain and some shortness of breath.  We Delayne Sanzo check a BNP.  He may wish to get off of his amiodarone.  He Afua Hoots need his abdominal issues addressed prior to that. ? ?2.  Hypertension: Currently well controlled ? ?3.  Secondary hypercoagulable state: Currently on Eliquis for atrial fibrillation as above ? ?Current medicines are reviewed at length with the patient today.   ?The patient does not have concerns regarding his medicines.  The following changes were made today: None ? ?Labs/ tests ordered today include:  ?Orders Placed This Encounter  ?Procedures  ? Pro b natriuretic peptide (BNP)  ? EKG 12-Lead  ? ? ? ?Disposition:   FU with EP app 6 months ? ?Signed, ?Madox Corkins [name], MD  ?09/22/2021 10:28 AM    ? ?[facility] ?[address] ?[address] ?[place] [address] ?[phone]  (office) ?[phone] (fax) ?

## 2021-09-23 LAB — PRO B NATRIURETIC PEPTIDE: NT-Pro BNP: 175 pg/mL (ref 0–376)

## 2021-12-07 ENCOUNTER — Telehealth: Payer: Self-pay | Admitting: Internal Medicine

## 2021-12-07 NOTE — Telephone Encounter (Signed)
Pt in chair at Dentist office, needs 1 tooth extraction.  Pt on Eliqius.  Per Doreene Adas, NP, 1 tooth extraction can be done without holding Eliquis.  Pt doesn't need ABX and and advised to use less Epinephrine as possible as pt has hx of A-fib.  Per Dentist office, they are not using Epinephrine.   Will route to Dentist office at [phone] per request.

## 2021-12-07 NOTE — Telephone Encounter (Signed)
Thank you :)

## 2021-12-07 NOTE — Telephone Encounter (Signed)
   Pre-operative Risk Assessment    Patient Name: Travis Conrad  DOB: [birth year] MRN: [id number]     Request for Surgical Clearance    Procedure:  Dental Extraction - Amount of Teeth to be Pulled:  1  Date of Surgery:  Clearance 12/07/21                                 Surgeon:  Dr Herold Harms  Surgeon's Group or Practice Name:  [facility]  Phone number:  [phone] Fax number:  [phone]   Type of Clearance Requested:   - Medical    Type of Anesthesia:   Citaents plain 4%   Additional requests/questions:      SignedMilbert Coulter   12/07/2021, 2:14 PM

## 2022-01-13 ENCOUNTER — Other Ambulatory Visit: Payer: Self-pay | Admitting: Student

## 2022-01-13 DIAGNOSIS — I480 Paroxysmal atrial fibrillation: Secondary | ICD-10-CM

## 2022-01-13 NOTE — Telephone Encounter (Signed)
Prescription refill request for Eliquis received. Indication: Afib  Last office visit:09/22/21 ([place])  Scr: 1.06 (05/25/21) Age: 71 Weight: 91.4kg  Appropriate dose and refill sent to requested pharmacy.

## 2022-02-10 ENCOUNTER — Telehealth: Payer: Self-pay | Admitting: Cardiology

## 2022-02-10 NOTE — Telephone Encounter (Signed)
Pt c/o medication issue:  1. Name of Medication:   ELIQUIS 5 MG TABS tablet    2. How are you currently taking this medication (dosage and times per day)?   TAKE 1 TABLET(5 MG) BY MOUTH TWICE DAILY 3. Are you having a reaction (difficulty breathing--STAT)? No  4. What is your medication issue? Spouse would like to know if medication can to changed to once daily. She states that pt is bruising easily. They would like a callback regarding this matter. Please advise

## 2022-02-10 NOTE — Telephone Encounter (Signed)
No the medication cannot be changed to once a day. This would put patient at a higher risk of stroke or clot. Unfortunately, while being on blood thinners, patient will bruise easier. It would be only concerning if those bruises do not heal over time or start to spread larger and larger.

## 2022-02-10 NOTE — Telephone Encounter (Signed)
Patient's spouse made aware that patient continue Eliquis BID for stroke prevention. Instructed for patient to let us know if he has S/Sx of bleeding or bruises that do not heal over time or start to spread larger. She verbalized understanding and thanked me foe the call.

## 2022-03-28 ENCOUNTER — Ambulatory Visit (HOSPITAL_COMMUNITY)
Admission: RE | Admit: 2022-03-28 | Discharge: 2022-03-28 | Disposition: A | Discharge status: Home / Self Care | Payer: Medicare Other | Source: Ambulatory Visit | Attending: Nurse Practitioner | Admitting: Nurse Practitioner

## 2022-03-28 DIAGNOSIS — Z433 Encounter for attention to colostomy: Secondary | ICD-10-CM | POA: Diagnosis not present

## 2022-03-28 DIAGNOSIS — L24B3 Irritant contact dermatitis related to fecal or urinary stoma or fistula: Secondary | ICD-10-CM

## 2022-03-28 DIAGNOSIS — Z933 Colostomy status: Secondary | ICD-10-CM | POA: Diagnosis present

## 2022-03-28 NOTE — Discharge Instructions (Signed)
Switching to coloplast 2 piece flexible A4425 coloplast Mio flexible pouch  A4410 mio flexible barrier Barrier ring and flonase spray to skin

## 2022-03-28 NOTE — Progress Notes (Signed)
[facility]   Reason for visit:  LLQ colostomy.  Ongoing pMARSI.  The adhesive from the [place] continues to irritate his peristomal skin, along the edge of the pouching area HPI:  Perforated diverticulum with LLQ colostomy ROS  Review of Systems  Gastrointestinal:        LLQ colostomy  Skin:  Positive for rash.       Contact dermatitis Peristomal MEDICAL ADHESIVE RELATED SKIN INJURY  Psychiatric/Behavioral:         Discouraged with itching and irritation from pouch  All other systems reviewed and are negative.  Vital signs:  BP (!) 143/68 (BP Location: Right Arm)   Pulse 63   Temp 97.9 F (36.6 C) (Oral)   Resp 18   SpO2 97%  Exam:  Physical Exam Vitals reviewed.  Constitutional:      Appearance: Normal appearance.  Abdominal:     Palpations: Abdomen is soft.  Skin:    Findings: Rash present.     Comments: Irritation to peristomal skin at pouching perimeter  Neurological:     Mental Status: He is alert and oriented to person, place, and time.  Psychiatric:        Mood and Affect: Mood normal.        Behavior: Behavior normal.     Stoma type/location:  LLQ colostomy Stomal assessment/size:  11/4" pink and moist  budded Peristomal assessment:  erythema and tenderness to pouching area where adhesive touches.   Treatment options for stomal/peristomal skin: tried coloplast convex last time (no adhesive) but found the pouch to be uncomfortable. Has been trying other options of pouches with little success.  Patient admits that if the pouch begins to lift, he starts picking at it.  Output: soft brown stool Going to try the coloplast flexible 2 piece flat pouch.  No adhesive Ostomy pouching: 2pc. Flexible Mio pouch system with barrier ring.  Apply topical spray steroid to irritation, skin prep and barrier ring.   Education provided:  Pouch change performed  they are to call me and let me know if they want to switch to this system.  I give 2 additional  pouch sets.     Impression/dx  Medical adhesive related skin injury pMARSI from ostomy appliance.  Discussion  Switch to pouch with no adhesive backing.  Plan  See back in one week.     Visit time: 55 minutes.   Domenic Moras FNP-BC

## 2022-03-31 DIAGNOSIS — Z433 Encounter for attention to colostomy: Secondary | ICD-10-CM | POA: Insufficient documentation

## 2022-03-31 DIAGNOSIS — L24B3 Irritant contact dermatitis related to fecal or urinary stoma or fistula: Secondary | ICD-10-CM

## 2022-03-31 HISTORY — DX: Irritant contact dermatitis related to fecal or urinary stoma or fistula: L24.B3

## 2022-04-04 ENCOUNTER — Other Ambulatory Visit (HOSPITAL_COMMUNITY): Payer: Self-pay | Admitting: Nurse Practitioner

## 2022-04-04 ENCOUNTER — Ambulatory Visit (HOSPITAL_COMMUNITY)
Admission: RE | Admit: 2022-04-04 | Discharge: 2022-04-04 | Disposition: A | Discharge status: Home / Self Care | Payer: Medicare Other | Source: Ambulatory Visit | Attending: Nurse Practitioner | Admitting: Nurse Practitioner

## 2022-04-04 DIAGNOSIS — R21 Rash and other nonspecific skin eruption: Secondary | ICD-10-CM | POA: Diagnosis not present

## 2022-04-04 DIAGNOSIS — Z433 Encounter for attention to colostomy: Secondary | ICD-10-CM | POA: Diagnosis not present

## 2022-04-04 DIAGNOSIS — L24B3 Irritant contact dermatitis related to fecal or urinary stoma or fistula: Secondary | ICD-10-CM | POA: Diagnosis not present

## 2022-04-04 DIAGNOSIS — L259 Unspecified contact dermatitis, unspecified cause: Secondary | ICD-10-CM | POA: Diagnosis not present

## 2022-04-04 NOTE — Discharge Instructions (Signed)
Continue coloplast Flexible 2 piece Will order flat and convex Sending home with belt Continue flonase to redness

## 2022-04-04 NOTE — Progress Notes (Signed)
[facility]   Reason for visit:  LLQ colostomy.  Medical adhesive related skin injury, improving after switch to pouch system without tape border.  HPI:  Perforated diverticulum with LLQ colostomy ROS  Review of Systems  Gastrointestinal:        LLQ colostomy  Skin:  Positive for rash.       Breakdown to peristomal skin at pouching perimeter, improving  All other systems reviewed and are negative.  Vital signs:  BP (!) 148/70 (BP Location: Right Arm)   Pulse (!) 59   Temp 97.8 F (36.6 C) (Oral)   Resp 20   SpO2 96%  Exam:  Physical Exam Vitals reviewed.  Constitutional:      Appearance: Normal appearance.  Abdominal:     Palpations: Abdomen is soft.  Skin:    Findings: Rash present.     Comments: Medical Adhesive related skin injury (MARSI) to pouching perimeter  Neurological:     Mental Status: He is alert and oriented to person, place, and time.  Psychiatric:        Mood and Affect: Mood normal.        Behavior: Behavior normal.     Stoma type/location:  LLQ colostomy Stomal assessment/size:  1 1/4" pink and moist Peristomal assessment:  skin is intact around stoma, improved erythema to pouching perimeter.  Tapeless border and topical steroid spray being used.  Treatment options for stomal/peristomal skin: barrier ring and 2 piece flexible pouch coloplast. Wear time improved to 3 days.  Output: soft brown stool Ostomy pouching: 2pc. Flexible coloplast  Education provided:  Will enroll with [employer]    Impression/dx  Contact dermatitis colostomy Discussion  Continue topical steroid spray (triamcinolone nasal spray)  Plan  See back in one week    Visit time: 55 minutes.   Domenic Moras FNP-BC

## 2022-04-11 NOTE — Progress Notes (Unsigned)
Office Visit    Patient Name: Travis Conrad Date of Encounter: 04/12/2022  Primary Care Provider:  Pcp, No Primary Cardiologist:  [name], MD Primary Electrophysiologist: [name], MD  Chief Complaint    Travis Conrad is a 71 y.o. male with PMH of PAF s/p ablation 09/2019 (on Eliquis), HTN, HLD, tobacco abuse, EtOH abuse, diverticulitis s/p perforation and abscess treated with sigmoid colectomy 03/2021 who presents today for 1 year follow-up of atrial fibrillation.   Past Medical History    Past Medical History:  Diagnosis Date   Atrial fibrillation ([place])    Dysrhythmia    a-fib   High cholesterol    "RX made groin hurt" (08/14/2016)   Hypertension    Past Surgical History:  Procedure Laterality Date   ANKLE FRACTURE SURGERY Left ~ 2012   "put rod and pins in"   ATRIAL FIBRILLATION ABLATION N/A 10/03/2019   Procedure: ATRIAL FIBRILLATION ABLATION;  Surgeon: Constance Haw, MD;  Location: [place] [facility];  Service: Cardiovascular;  Laterality: N/A;   COLECTOMY N/A 02/26/2021   Procedure: PARTIAL COLECTOMY AND COLOSTOMY;  Surgeon: Kieth Brightly, Arta Bruce, MD;  Location: [place];  Service: General;  Laterality: N/A;   COLONOSCOPY W/ BIOPSIES AND POLYPECTOMY  ~ 2000   FRACTURE SURGERY     LAPAROTOMY  02/26/2021   Procedure: EXPLORATORY LAPAROTOMY;  Surgeon: Mickeal Skinner, MD;  Location: [place];  Service: General;;   ORIF FIBULA FRACTURE  06/24/2012   Procedure: OPEN REDUCTION INTERNAL FIXATION (ORIF) FIBULA FRACTURE;  Surgeon: [name], MD;  Location: [place];  Service: Orthopedics;  Laterality: Left;  ORIF LEFT FIBULA,  POSSIBLE REPAIR OF SYNDESMOSIS    TEE WITHOUT CARDIOVERSION N/A 10/03/2019   Procedure: TRANSESOPHAGEAL ECHOCARDIOGRAM (TEE);  Surgeon: Constance Haw, MD;  Location: [place] [facility];  Service: Cardiovascular;  Laterality: N/A;    Allergies  Allergies  Allergen Reactions   Atorvastatin Other (See Comments)     myalgias    History of Present Illness    Travis Conrad  is a 71 year old male with the above mention past medical history who presents today for follow-up of atrial fibrillation.  Travis Conrad was initially seen by [name] in 2015 for management of atrial fibrillation.  He was treated with sotalol and had significant improvement of burden.  Patient continued to consume a pint of whiskey per day and was encouraged to decrease consumption.  CT of the chest was completed in 2017 showing atherosclerosis and plaque in coronary arteries.  Patient was asymptomatic at the time and continued to have complaints of palpitations.  He wore an event monitor in 2021 that revealed recurrent atrial fibrillation and patient was referred to EP for evaluation.  CT 08/18/19 prior to ablation and calcium score 1980 with negative FFR in circumflex and proximal mid LAD but RCA and distal LAD not intepretable  FFR 0.82 OM1 and 0.87 in distal circumflex  Recommendation for ablation was made and patient underwent procedure 09/2019 that was successful.  He remained fairly asymptomatic with occasional palpitations.  He was admitted 02/19/2021 with vomiting and abdominal pain with atrial flutter with RVR and hypotension.  He was found to have diverticulitis with perforation and sepsis.  He was treated with amiodarone and underwent colectomy with postop ileus.  This was complicated also with postop anemia that required 1 unit PRBC.  He was transferred to [facility] and discharge 04/2021.  He was seen in follow-up by  EP and patient elected to continue with amiodarone due to suppression of atrial fibrillation. He was last seen in follow-up by Dr. [name] on 09/2021 and patient denied any recurrence of atrial fibrillation.  He was noted to have gained some weight and was continuing to be evaluated by general surgery for reversal of his ostomy.  The decision was made to discontinue amiodarone once abdominal issues are resolved.  Travis Conrad presents  today for annual follow-up of atrial fibrillation with his wife.  Since last being seen in the office patient reports that he is doing well with no recurrence of atrial fibrillation or chest pain.  He is currently waiting to meet with general surgeon regarding reversing his ostomy.  He is tolerating amiodarone without any adverse reactions.  He was euvolemic on examination today.  Patient's blood pressure today was well controlled at 132/76 and heart rate was 50 bpm but patient recently took medication prior to visit.  Patient denies chest pain, palpitations, dyspnea, PND, orthopnea, nausea, vomiting, dizziness, syncope, edema, weight gain, or early satiety.   Home Medications    Current Outpatient Medications  Medication Sig Dispense Refill   amiodarone (PACERONE) 200 MG tablet Take 1 tablet (200 mg total) by mouth daily. 90 tablet 3   diltiazem (CARDIZEM CD) 120 MG 24 hr capsule TAKE 1 CAPSULE(120 MG) BY MOUTH DAILY 90 capsule 1   docusate sodium (COLACE) 100 MG capsule Take 1 capsule (100 mg total) by mouth 2 (two) times daily. 10 capsule 0   ELIQUIS 5 MG TABS tablet TAKE 1 TABLET(5 MG) BY MOUTH TWICE DAILY 60 tablet 5   nitroGLYCERIN (NITROSTAT) 0.4 MG SL tablet Place 1 tablet (0.4 mg total) under the tongue every 5 (five) minutes as needed for chest pain. 25 tablet 0   No current facility-administered medications for this visit.     Review of Systems  Please see the history of present illness.    All other systems reviewed and are otherwise negative except as noted above.  Physical Exam    Wt Readings from Last 3 Encounters:  04/12/22 206 lb 12.8 oz (93.8 kg)  09/22/21 201 lb 6.4 oz (91.4 kg)  05/25/21 170 lb (77.1 kg)   VS: Vitals:   04/12/22 1028  BP: 132/76  Pulse: (!) 50  SpO2: 97%  ,Body mass index is 28.05 kg/m.  Constitutional:      Appearance: Healthy appearance. Not in distress.  Neck:     Vascular: JVD normal.  Pulmonary:     Effort: Pulmonary effort is normal.      Breath sounds: No wheezing. No rales. Diminished in the bases Cardiovascular:     Normal rate. Regular rhythm. Normal S1. Normal S2.      Murmurs: There is no murmur.  Edema:    Peripheral edema absent.  Abdominal:     Palpations: Abdomen is soft non tender. There is no hepatomegaly.  Skin:    General: Skin is warm and dry.  Neurological:     General: No focal deficit present.     Mental Status: Alert and oriented to person, place and time.     Cranial Nerves: Cranial nerves are intact.  EKG/LABS/Other Studies Reviewed    ECG personally reviewed by me today -none completed today  Risk Assessment/Calculations:    CHA2DS2-VASc Score = 2   This indicates a 2.2% annual risk of stroke. The patient's score is based upon: CHF History: 0 HTN History: 1 Diabetes History: 0 Stroke History: 0 Vascular Disease  History: 0 Age Score: 1 Gender Score: 0           Lab Results  Component Value Date   WBC 14.0 (H) 05/25/2021   HGB 13.7 05/25/2021   HCT 41.8 05/25/2021   MCV 80 05/25/2021   PLT 438 05/25/2021   Lab Results  Component Value Date   CREATININE 1.06 05/25/2021   BUN 13 05/25/2021   NA 138 05/25/2021   K 4.5 05/25/2021   CL 97 05/25/2021   CO2 26 05/25/2021   Lab Results  Component Value Date   ALT 9 05/25/2021   AST 15 05/25/2021   ALKPHOS 194 (H) 05/25/2021   BILITOT <0.2 05/25/2021   Lab Results  Component Value Date   CHOL 102 09/30/2019   HDL 35 (L) 09/30/2019   LDLCALC 32 09/30/2019   TRIG 159 (H) 04/04/2021   CHOLHDL 2.9 09/30/2019    Lab Results  Component Value Date   HGBA1C 5.9 (H) 02/25/2021    Assessment & Plan    1.  Paroxysmal atrial fibrillation: -s/p AF ablation 09/2019 with no recurrence or episodes -Rate controlled currently on amiodarone 200 mg daily and Cardizem 1 to 20 mg daily -Thyroid and liver functions recently monitored -Patient reports no recurrence since hospital discharge. -Currently on Eliquis 5 mg twice  daily -Current dose appropriate based on patient's age and weight. -We will obtain CBC, BMET, TSH today  2.  Nonobstructive CAD: -CTA completed 2021 score 1980 with negative FFR in circumflex and proximal mid LAD but RCA and distal LAD not intepretable  -Today patient reports no angina or equivalent  3.  Essential hypertension: -Blood pressure today was well controlled at 132/76 -Continue Cardizem as noted above  4.  Secondary hypercoagulable state: -Patient denies any bleeding incidents -Continue Eliquis 5 mg twice daily     Disposition: Follow-up with [name], MD or APP in 12 months    Medication Adjustments/Labs and Tests Ordered: Current medicines are reviewed at length with the patient today.  Concerns regarding medicines are outlined above.   Signed, Mable Fill, Marissa Nestle, NP 04/12/2022, 12:26 PM [facility]  Note:  This document was prepared using Dragon voice recognition software and may include unintentional dictation errors.

## 2022-04-12 ENCOUNTER — Ambulatory Visit: Payer: Medicare Other | Attending: Nurse Practitioner | Admitting: Nurse Practitioner

## 2022-04-12 ENCOUNTER — Encounter: Payer: Self-pay | Admitting: Nurse Practitioner

## 2022-04-12 VITALS — BP 132/76 | HR 50 | Wt 206.8 lb

## 2022-04-12 DIAGNOSIS — D6869 Other thrombophilia: Secondary | ICD-10-CM | POA: Diagnosis not present

## 2022-04-12 DIAGNOSIS — I480 Paroxysmal atrial fibrillation: Secondary | ICD-10-CM | POA: Diagnosis not present

## 2022-04-12 DIAGNOSIS — I2510 Atherosclerotic heart disease of native coronary artery without angina pectoris: Secondary | ICD-10-CM | POA: Diagnosis not present

## 2022-04-12 DIAGNOSIS — I10 Essential (primary) hypertension: Secondary | ICD-10-CM

## 2022-04-12 NOTE — Patient Instructions (Signed)
Medication Instructions:  Your physician recommends that you continue on your current medications as directed. Please refer to the Current Medication list given to you today.  *If you need a refill on your cardiac medications before your next appointment, please call your pharmacy*   Lab Work: BMET, CBC, LFT, TSH If you have labs (blood work) drawn today and your tests are completely normal, you will receive your results only by: [place] (if you have MyChart) OR A paper copy in the mail If you have any lab test that is abnormal or we need to change your treatment, we will call you to review the results.   Follow-Up: At [facility], you and your health needs are our priority.  As part of our continuing mission to provide you with exceptional heart care, we have created designated Provider Care Teams.  These Care Teams include your primary Cardiologist (physician) and Advanced Practice Providers (APPs -  Physician Assistants and Nurse Practitioners) who all work together to provide you with the care you need, when you need it.   Your next appointment:   1 year(s)  The format for your next appointment:   In Person  Provider:   [name], MD      Important Information About [place]

## 2022-04-13 ENCOUNTER — Telehealth: Payer: Self-pay

## 2022-04-13 DIAGNOSIS — E871 Hypo-osmolality and hyponatremia: Secondary | ICD-10-CM

## 2022-04-13 DIAGNOSIS — I480 Paroxysmal atrial fibrillation: Secondary | ICD-10-CM

## 2022-04-13 DIAGNOSIS — I482 Chronic atrial fibrillation, unspecified: Secondary | ICD-10-CM

## 2022-04-13 LAB — CBC
Hematocrit: 45.1 % (ref 37.5–51.0)
Hemoglobin: 15.2 g/dL (ref 13.0–17.7)
MCH: 30.6 pg (ref 26.6–33.0)
MCHC: 33.7 g/dL (ref 31.5–35.7)
MCV: 91 fL (ref 79–97)
Platelets: 272 10*3/uL (ref 150–450)
RBC: 4.96 x10E6/uL (ref 4.14–5.80)
RDW: 13.6 % (ref 11.6–15.4)
WBC: 9.9 10*3/uL (ref 3.4–10.8)

## 2022-04-13 LAB — HEPATIC FUNCTION PANEL
ALT: 20 IU/L (ref 0–44)
AST: 23 IU/L (ref 0–40)
Albumin: 4.3 g/dL (ref 3.8–4.8)
Alkaline Phosphatase: 129 IU/L — ABNORMAL HIGH (ref 44–121)
Bilirubin Total: 0.4 mg/dL (ref 0.0–1.2)
Bilirubin, Direct: 0.13 mg/dL (ref 0.00–0.40)
Total Protein: 7 g/dL (ref 6.0–8.5)

## 2022-04-13 LAB — BASIC METABOLIC PANEL
BUN/Creatinine Ratio: 8 — ABNORMAL LOW (ref 10–24)
BUN: 11 mg/dL (ref 8–27)
CO2: 24 mmol/L (ref 20–29)
Calcium: 9.5 mg/dL (ref 8.6–10.2)
Chloride: 101 mmol/L (ref 96–106)
Creatinine, Ser: 1.4 mg/dL — ABNORMAL HIGH (ref 0.76–1.27)
Glucose: 89 mg/dL (ref 70–99)
Potassium: 4.9 mmol/L (ref 3.5–5.2)
Sodium: 139 mmol/L (ref 134–144)
eGFR: 54 mL/min/{1.73_m2} — ABNORMAL LOW (ref >59)

## 2022-04-13 LAB — TSH: TSH: 2.18 u[IU]/mL (ref 0.450–4.500)

## 2022-04-13 NOTE — Telephone Encounter (Signed)
-----   Message from Marylu Lund., NP sent at 04/13/2022  1:12 PM EDT ----- Please let Travis Conrad know that his renal function has increased since his previous blood work in December.  Please encourage patient to stay hydrated and we will recheck BMET in 1 month. Please continue current Eliquis dose as prescribed   CBC and blood counts counts are normal and your alkaline phosphate has decreased since previous check.  TSH is also normal.  We will continue to monitor while on amiodarone.  Please let me know if you have any additional questions.

## 2022-04-14 NOTE — Telephone Encounter (Signed)
Left message for return call.

## 2022-04-14 NOTE — Telephone Encounter (Signed)
Spoke with patient's wife Jimmi. Reviewed labs and and  instructions from Ambrose Pancoast, NP regarding staying hydrated and getting repeat lab work in 4 weeks. Patient will come in for BMET on 05/08/22.  Wife verbalized understanding.

## 2022-04-14 NOTE — Telephone Encounter (Signed)
-----   Message from Marylu Lund., NP sent at 04/13/2022  1:12 PM EDT ----- Please let Travis Conrad know that his renal function has increased since his previous blood work in December.  Please encourage patient to stay hydrated and we will recheck BMET in 1 month. Please continue current Eliquis dose as prescribed   CBC and blood counts counts are normal and your alkaline phosphate has decreased since previous check.  TSH is also normal.  We will continue to monitor while on amiodarone.  Please let me know if you have any additional questions.

## 2022-04-14 NOTE — Telephone Encounter (Signed)
Call transferred to nurse

## 2022-05-03 ENCOUNTER — Encounter (HOSPITAL_COMMUNITY): Payer: Self-pay | Admitting: Nurse Practitioner

## 2022-05-08 ENCOUNTER — Ambulatory Visit: Payer: Medicare Other | Attending: Nurse Practitioner

## 2022-05-08 DIAGNOSIS — I480 Paroxysmal atrial fibrillation: Secondary | ICD-10-CM

## 2022-05-08 DIAGNOSIS — E871 Hypo-osmolality and hyponatremia: Secondary | ICD-10-CM

## 2022-05-08 DIAGNOSIS — I482 Chronic atrial fibrillation, unspecified: Secondary | ICD-10-CM

## 2022-05-09 ENCOUNTER — Other Ambulatory Visit: Payer: Self-pay

## 2022-05-09 DIAGNOSIS — N179 Acute kidney failure, unspecified: Secondary | ICD-10-CM

## 2022-05-09 LAB — BASIC METABOLIC PANEL
BUN/Creatinine Ratio: 9 — ABNORMAL LOW (ref 10–24)
BUN: 12 mg/dL (ref 8–27)
CO2: 25 mmol/L (ref 20–29)
Calcium: 9.7 mg/dL (ref 8.6–10.2)
Chloride: 102 mmol/L (ref 96–106)
Creatinine, Ser: 1.38 mg/dL — ABNORMAL HIGH (ref 0.76–1.27)
Glucose: 123 mg/dL — ABNORMAL HIGH (ref 70–99)
Potassium: 4.6 mmol/L (ref 3.5–5.2)
Sodium: 140 mmol/L (ref 134–144)
eGFR: 55 mL/min/{1.73_m2} — ABNORMAL LOW (ref >59)

## 2022-05-29 DIAGNOSIS — R7989 Other specified abnormal findings of blood chemistry: Secondary | ICD-10-CM

## 2022-06-26 ENCOUNTER — Ambulatory Visit: Payer: Medicare Other | Admitting: Student

## 2022-07-27 ENCOUNTER — Encounter (HOSPITAL_COMMUNITY): Payer: Self-pay | Admitting: *Deleted

## 2022-08-02 ENCOUNTER — Telehealth: Payer: Self-pay

## 2022-08-02 DIAGNOSIS — R931 Abnormal findings on diagnostic imaging of heart and coronary circulation: Secondary | ICD-10-CM

## 2022-08-02 NOTE — Telephone Encounter (Signed)
..     Pre-operative Risk Assessment    Patient Name: Travis Conrad  DOB: 01/24/1951 MRN: [id number]      Request for Surgical Clearance    Procedure:   COLOSTOMY REVERSAL SURGERY  Date of Surgery:  Clearance TBD                                 Surgeon:  DR Gurney Maxin Surgeon's Group or Practice Name:  [facility] Phone number:  [phone] Fax number:  [phone]   Type of Clearance Requested:   - Medical  - Pharmacy:  Hold Apixaban (Eliquis)     Type of Anesthesia:  General    Additional requests/questions:    Gwenlyn Found   08/02/2022, 1:55 PM

## 2022-08-02 NOTE — Telephone Encounter (Signed)
Pharmacy please advise on holding Eliquis prior to colostomy reversal surgery scheduled for TBD. Thank you.

## 2022-08-03 DIAGNOSIS — I2510 Atherosclerotic heart disease of native coronary artery without angina pectoris: Secondary | ICD-10-CM

## 2022-08-03 DIAGNOSIS — R931 Abnormal findings on diagnostic imaging of heart and coronary circulation: Secondary | ICD-10-CM | POA: Insufficient documentation

## 2022-08-03 HISTORY — DX: Atherosclerotic heart disease of native coronary artery without angina pectoris: I25.10

## 2022-08-03 NOTE — Telephone Encounter (Signed)
   Name: Travis Conrad  DOB: 03-10-1951  MRN: [id number]  Primary Cardiologist: [name], MD   Preoperative team, please contact this patient and set up a phone call appointment for further preoperative risk assessment. Please obtain consent and complete medication review. Thank you for your help.  I confirm that guidance regarding antiplatelet and oral anticoagulation therapy has been completed and, if necessary, noted below.  Per office protocol, patient can hold Eliquis for 2-3 days prior to procedure.       Ledora Bottcher, PA 08/03/2022, 12:46 PM [facility]

## 2022-08-03 NOTE — Telephone Encounter (Signed)
Patient with diagnosis of afib on Eliquis for anticoagulation.    Procedure: colostomy reversal surgery  Date of procedure: TBD  CHA2DS2-VASc Score = 3  This indicates a 3.2% annual risk of stroke. The patient's score is based upon: CHF History: 0 HTN History: 1 Diabetes History: 0 Stroke History: 0 Vascular Disease History: 1 Age Score: 1 Gender Score: 0   CrCl 75m/min Platelet count 272K  Per office protocol, patient can hold Eliquis for 2-3 days prior to procedure.    **This guidance is not considered finalized until pre-operative APP has relayed final recommendations.**

## 2022-08-03 NOTE — Telephone Encounter (Signed)
Tried to call the pt to set up tele pre op appt but no answer

## 2022-08-04 ENCOUNTER — Telehealth: Payer: Self-pay | Admitting: *Deleted

## 2022-08-04 NOTE — Telephone Encounter (Signed)
Pt's wife is returning call and is requesting return call.

## 2022-08-04 NOTE — Telephone Encounter (Signed)
I s/w [name]. Pt has been scheduled for tele pre op appt 08/28/22 @ 10 am. Med rec and consent are done.     Patient Consent for Virtual Visit        Travis Conrad has provided verbal consent on 08/04/2022 for a virtual visit (video or telephone).   CONSENT FOR VIRTUAL VISIT FOR:  Travis Conrad  By participating in this virtual visit I agree to the following:  I hereby voluntarily request, consent and authorize [place] [facility] and its employed or contracted physicians, physician assistants, nurse practitioners or other licensed health care professionals (the Practitioner), to provide me with telemedicine health care services (the "Services") as deemed necessary by the treating Practitioner. I acknowledge and consent to receive the Services by the Practitioner via telemedicine. I understand that the telemedicine visit will involve communicating with the Practitioner through live audiovisual communication technology and the disclosure of certain medical information by electronic transmission. I acknowledge that I have been given the opportunity to request an in-person assessment or other available alternative prior to the telemedicine visit and am voluntarily participating in the telemedicine visit.  I understand that I have the right to withhold or withdraw my consent to the use of telemedicine in the course of my care at any time, without affecting my right to future care or treatment, and that the Practitioner or I may terminate the telemedicine visit at any time. I understand that I have the right to inspect all information obtained and/or recorded in the course of the telemedicine visit and may receive copies of available information for a reasonable fee.  I understand that some of the potential risks of receiving the Services via telemedicine include:  Delay or interruption in medical evaluation due to technological equipment failure or disruption; Information transmitted  may not be sufficient (e.g. poor resolution of images) to allow for appropriate medical decision making by the Practitioner; and/or  In rare instances, security protocols could fail, causing a breach of personal health information.  Furthermore, I acknowledge that it is my responsibility to provide information about my medical history, conditions and care that is complete and accurate to the best of my ability. I acknowledge that Practitioner's advice, recommendations, and/or decision may be based on factors not within their control, such as incomplete or inaccurate data provided by me or distortions of diagnostic images or specimens that may result from electronic transmissions. I understand that the practice of medicine is not an exact science and that Practitioner makes no warranties or guarantees regarding treatment outcomes. I acknowledge that a copy of this consent can be made available to me via my patient portal ([facility]), or I can request a printed copy by calling the office of [facility].    I understand that my insurance will be billed for this visit.   I have read or had this consent read to me. I understand the contents of this consent, which adequately explains the benefits and risks of the Services being provided via telemedicine.  I have been provided ample opportunity to ask questions regarding this consent and the Services and have had my questions answered to my satisfaction. I give my informed consent for the services to be provided through the use of telemedicine in my medical care

## 2022-08-04 NOTE — Telephone Encounter (Signed)
I s/w [name]. Pt has been scheduled for tele pre op appt 08/28/22 @ 10 am. Med rec and consent are done.

## 2022-08-28 ENCOUNTER — Ambulatory Visit: Payer: Medicare Other | Attending: Cardiovascular Disease

## 2022-08-28 DIAGNOSIS — Z01810 Encounter for preprocedural cardiovascular examination: Secondary | ICD-10-CM

## 2022-08-28 NOTE — Progress Notes (Signed)
Virtual Visit via Telephone Note   Because of Travis Conrad co-morbid illnesses, he is at least at moderate risk for complications without adequate follow up.  This format is felt to be most appropriate for this patient at this time.  The patient did not have access to video technology/had technical difficulties with video requiring transitioning to audio format only (telephone).  All issues noted in this document were discussed and addressed.  No physical exam could be performed with this format.  Please refer to the patient's chart for his consent to telehealth for [id number].  Evaluation Performed:  Preoperative cardiovascular risk assessment _____________   Date:  08/28/2022   Patient ID:  Travis Conrad, DOB 1951/02/18, MRN [id number] Patient Location:  Home Provider location:   Office  Primary Care Provider:  Pcp, No Primary Cardiologist:  Travis Carnes, MD  Chief Complaint / Patient Profile   72 y.o. y/o male with a h/o PAF s/p ablation 09/2019 (on Eliquis), HTN, HLD, tobacco abuse, EtOH abuse, diverticulitis s/p perforation and abscess treated with sigmoid colectomy  who is pending colostomy reversal surgery and presents today for telephonic preoperative cardiovascular risk assessment.  History of Present Illness    Travis Conrad is a 72 y.o. male who presents via audio/video conferencing for a telehealth visit today.  Pt was last seen in cardiology clinic on 04/12/2022 during by Travis Pancoast, NP.  At that time Travis Conrad was doing well with no recurrence of atrial fibrillation.  The patient is now pending procedure as outlined above. Since his last visit, he is doing well with no new cardiac complaints at this time.  He is staying active and walking in his backyard which is over an acre.   He denies chest pain, shortness of breath, lower extremity edema, fatigue, palpitations, melena, hematuria, hemoptysis, diaphoresis, weakness, presyncope, syncope,  orthopnea, and PND.    Past Medical History    Past Medical History:  Diagnosis Date   Atrial fibrillation ([place])    Dysrhythmia    a-fib   High cholesterol    "RX made groin hurt" (08/14/2016)   Hypertension    Past Surgical History:  Procedure Laterality Date   ANKLE FRACTURE SURGERY Left ~ 2012   "put rod and pins in"   ATRIAL FIBRILLATION ABLATION N/A 10/03/2019   Procedure: ATRIAL FIBRILLATION ABLATION;  Surgeon: Travis Haw, MD;  Location: [facility];  Service: Cardiovascular;  Laterality: N/A;   COLECTOMY N/A 02/26/2021   Procedure: PARTIAL COLECTOMY AND COLOSTOMY;  Surgeon: Travis Brightly, Arta Bruce, MD;  Location: [facility];  Service: General;  Laterality: N/A;   COLONOSCOPY W/ BIOPSIES AND POLYPECTOMY  ~ 2000   FRACTURE SURGERY     LAPAROTOMY  02/26/2021   Procedure: EXPLORATORY LAPAROTOMY;  Surgeon: Travis Skinner, MD;  Location: [facility];  Service: General;;   ORIF FIBULA FRACTURE  06/24/2012   Procedure: OPEN REDUCTION INTERNAL FIXATION (ORIF) FIBULA FRACTURE;  Surgeon: Travis Box, MD;  Location: [facility];  Service: Orthopedics;  Laterality: Left;  ORIF LEFT FIBULA,  POSSIBLE REPAIR OF SYNDESMOSIS    TEE WITHOUT CARDIOVERSION N/A 10/03/2019   Procedure: TRANSESOPHAGEAL ECHOCARDIOGRAM (TEE);  Surgeon: Travis Haw, MD;  Location: [facility];  Service: Cardiovascular;  Laterality: N/A;    Allergies  Allergies  Allergen Reactions   Atorvastatin Other (See Comments)    myalgias    Home Medications    Prior to Admission medications   Medication Sig Start  Date End Date Taking? Authorizing Provider  amiodarone (PACERONE) 200 MG tablet Take 1 tablet (200 mg total) by mouth daily. 09/22/21   Conrad, Travis Hassell Done, MD  diltiazem (CARDIZEM CD) 120 MG 24 hr capsule TAKE 1 CAPSULE(120 MG) BY MOUTH DAILY 09/22/21   Conrad, Ocie Doyne, MD  docusate sodium (COLACE) 100 MG capsule Take 1 capsule (100 mg total) by mouth 2 (two) times daily. Patient not  taking: Reported on 08/04/2022 04/06/21   Travis Girt, DO  ELIQUIS 5 MG TABS tablet TAKE 1 TABLET(5 MG) BY MOUTH TWICE DAILY 01/13/22   Conrad, Ocie Doyne, MD  nitroGLYCERIN (NITROSTAT) 0.4 MG SL tablet Place 1 tablet (0.4 mg total) under the tongue every 5 (five) minutes as needed for chest pain. Patient not taking: Reported on 08/04/2022 04/19/21   Travis Conrad    Physical Exam    Vital Signs:  Travis Conrad does not have vital signs available for review today.  Given telephonic nature of communication, physical exam is limited. AAOx3. NAD. Normal affect.  Speech and respirations are unlabored.  Accessory Clinical Findings    None  Assessment & Plan    1.  Preoperative Cardiovascular Risk Assessment:  Mr. Zagorski perioperative risk of a major cardiac event is 0.9% according to the Revised Cardiac Risk Index (RCRI).  Therefore, he is at low risk for perioperative complications.   His functional capacity is fair at 4.31 METs according to the Duke Activity Status Index (DASI). Recommendations: According to ACC/AHA guidelines, no further cardiovascular testing needed.  The patient may proceed to surgery at acceptable risk.   Antiplatelet and/or Anticoagulation Recommendations:  Eliquis (Apixaban) can be held for 2-3 days prior to surgery.  Please resume post op when felt to be safe.      The patient was advised that if he develops new symptoms prior to surgery to contact our office to arrange for a follow-up visit, and he verbalized understanding.  A copy of this note Travis be routed to requesting surgeon.  Time:   Today, I have spent 8 minutes with the patient with telehealth technology discussing medical history, symptoms, and management plan.     Travis Conrad, Travis Nestle, NP  08/28/2022, 7:34 AM

## 2022-08-31 ENCOUNTER — Other Ambulatory Visit: Payer: Self-pay | Admitting: General Surgery

## 2022-08-31 DIAGNOSIS — Z933 Colostomy status: Secondary | ICD-10-CM

## 2022-09-15 ENCOUNTER — Ambulatory Visit
Admission: RE | Admit: 2022-09-15 | Discharge: 2022-09-15 | Disposition: A | Discharge status: Home / Self Care | Payer: Medicare Other | Source: Ambulatory Visit | Attending: General Surgery | Admitting: General Surgery

## 2022-09-15 ENCOUNTER — Other Ambulatory Visit: Payer: Self-pay | Admitting: General Surgery

## 2022-09-15 DIAGNOSIS — Z933 Colostomy status: Secondary | ICD-10-CM

## 2022-10-03 ENCOUNTER — Telehealth: Payer: Self-pay | Admitting: *Deleted

## 2022-10-03 NOTE — Telephone Encounter (Signed)
Patient with diagnosis of atrial fibrillation on Eliquis for anticoagulation.    Procedure: colonoscopy/endoscopy Date of procedure: 10/25/22   CHA2DS2-VASc Score = 3   This indicates a 3.2% annual risk of stroke. The patient's score is based upon: CHF History: 0 HTN History: 1 Diabetes History: 0 Stroke History: 0 Vascular Disease History: 1 Age Score: 1 Gender Score: 0    CrCl 64 Platelet count 272  Per office protocol, patient can hold Eliquis for 2 days prior to procedure.   Patient will not need bridging with Lovenox (enoxaparin) around procedure.  **This guidance is not considered finalized until pre-operative APP has relayed final recommendations.**

## 2022-10-03 NOTE — Telephone Encounter (Signed)
   Pre-operative Risk Assessment    Patient Name: Travis Conrad  DOB: [birth year] MRN: [id number]      Request for Surgical Clearance    Procedure:   COLONOSCOPY/ENDOSCOPY  Date of Surgery:  Clearance 10/25/22                                 Surgeon:  DR. Bosie Clos Surgeon's Group or Practice Name:  [facility] Phone number:  [phone] Fax number:  [phone]   Type of Clearance Requested:   - Medical  - Pharmacy:  Hold Apixaban (Eliquis)     Type of Anesthesia:   PROPOFOL   Additional requests/questions:    Elpidio Anis   10/03/2022, 10:12 AM

## 2022-10-04 ENCOUNTER — Telehealth: Payer: Self-pay | Admitting: *Deleted

## 2022-10-04 NOTE — Telephone Encounter (Signed)
   Name: Travis Conrad  DOB: 30-Sep-1950  MRN: [id number]  Primary Cardiologist: Dietrich Pates, MD  Chart reviewed as part of pre-operative protocol coverage. Because of [name] past medical history and time since last visit, he will require a follow-up telephone visit in order to better assess preoperative cardiovascular risk.  Pre-op covering staff: - Please schedule appointment and call patient to inform them. If patient already had an upcoming appointment within acceptable timeframe, please add "pre-op clearance" to the appointment notes so provider is aware. - Please contact requesting surgeon's office via preferred method (i.e, phone, fax) to inform them of need for appointment prior to surgery.  Per office protocol, patient can hold Eliquis for 2 days prior to procedure.   Patient will not need bridging with Lovenox (enoxaparin) around procedure.  Sharlene Dory, PA-C  10/04/2022, 7:51 AM

## 2022-10-04 NOTE — Telephone Encounter (Signed)
  Patient Consent for Virtual Visit         Travis Conrad has provided verbal consent on 10/04/2022 for a virtual visit (video or telephone).   CONSENT FOR VIRTUAL VISIT FOR:  Travis Conrad  By participating in this virtual visit I agree to the following:  I hereby voluntarily request, consent and authorize [facility] and its employed or contracted physicians, physician assistants, nurse practitioners or other licensed health care professionals (the Practitioner), to provide me with telemedicine health care services (the "Services") as deemed necessary by the treating Practitioner. I acknowledge and consent to receive the Services by the Practitioner via telemedicine. I understand that the telemedicine visit will involve communicating with the Practitioner through live audiovisual communication technology and the disclosure of certain medical information by electronic transmission. I acknowledge that I have been given the opportunity to request an in-person assessment or other available alternative prior to the telemedicine visit and am voluntarily participating in the telemedicine visit.  I understand that I have the right to withhold or withdraw my consent to the use of telemedicine in the course of my care at any time, without affecting my right to future care or treatment, and that the Practitioner or I may terminate the telemedicine visit at any time. I understand that I have the right to inspect all information obtained and/or recorded in the course of the telemedicine visit and may receive copies of available information for a reasonable fee.  I understand that some of the potential risks of receiving the Services via telemedicine include:  Delay or interruption in medical evaluation due to technological equipment failure or disruption; Information transmitted may not be sufficient (e.g. poor resolution of images) to allow for appropriate medical decision making by the  Practitioner; and/or  In rare instances, security protocols could fail, causing a breach of personal health information.  Furthermore, I acknowledge that it is my responsibility to provide information about my medical history, conditions and care that is complete and accurate to the best of my ability. I acknowledge that Practitioner's advice, recommendations, and/or decision may be based on factors not within their control, such as incomplete or inaccurate data provided by me or distortions of diagnostic images or specimens that may result from electronic transmissions. I understand that the practice of medicine is not an exact science and that Practitioner makes no warranties or guarantees regarding treatment outcomes. I acknowledge that a copy of this consent can be made available to me via my patient portal [facility] MyChart), or I can request a printed copy by calling the office of [facility].    I understand that my insurance will be billed for this visit.   I have read or had this consent read to me. I understand the contents of this consent, which adequately explains the benefits and risks of the Services being provided via telemedicine.  I have been provided ample opportunity to ask questions regarding this consent and the Services and have had my questions answered to my satisfaction. I give my informed consent for the services to be provided through the use of telemedicine in my medical care

## 2022-10-15 ENCOUNTER — Other Ambulatory Visit: Payer: Self-pay | Admitting: Cardiology

## 2022-10-15 DIAGNOSIS — I480 Paroxysmal atrial fibrillation: Secondary | ICD-10-CM

## 2022-10-15 NOTE — Progress Notes (Unsigned)
Virtual Visit via Telephone Note   Because of Travis Conrad co-morbid illnesses, he is at least at moderate risk for complications without adequate follow up.  This format is felt to be most appropriate for this patient at this time.  The patient did not have access to video technology/had technical difficulties with video requiring transitioning to audio format only (telephone).  All issues noted in this document were discussed and addressed.  No physical exam could be performed with this format.  Please refer to the patient's chart for his consent to telehealth for [facility].  Evaluation Performed:  Preoperative cardiovascular risk assessment _____________   Date:  10/15/2022   Patient ID:  Travis Conrad, DOB Jul 28, 1950, MRN [id number] Patient Location:  Home Provider location:   Office  Primary Care Provider:  Pcp, No Primary Cardiologist:  Dietrich Pates, MD  Chief Complaint / Patient Profile   72 y.o. y/o male with a h/o AF s/p ablation 09/2019 (on Eliquis), HTN, HLD, tobacco abuse, EtOH abuse, diverticulitis s/p perforation and abscess treated with sigmoid colectomy 03/2021  who is pending colonoscopy/endoscopy and presents today for telephonic preoperative cardiovascular risk assessment.  History of Present Illness    Travis Conrad is a 72 y.o. male who presents via audio/video conferencing for a telehealth visit today.  Pt was last seen in cardiology clinic on 04/12/2022 by Robin Searing, NP.  At that time KEOLA HENINGER was doing well with no new cardiac complaints..  The patient is now pending procedure as outlined above. Since his last visit, he is doing well with no new cardiac complaints.  He denies chest pain, shortness of breath, lower extremity edema, fatigue, palpitations, melena, hematuria, hemoptysis, diaphoresis, weakness, presyncope, syncope, orthopnea, and PND.    Past Medical History    Past Medical History:  Diagnosis Date   Atrial  fibrillation (HCC)    Dysrhythmia    a-fib   High cholesterol    "RX made groin hurt" (08/14/2016)   Hypertension    Past Surgical History:  Procedure Laterality Date   ANKLE FRACTURE SURGERY Left ~ 2012   "put rod and pins in"   ATRIAL FIBRILLATION ABLATION N/A 10/03/2019   Procedure: ATRIAL FIBRILLATION ABLATION;  Surgeon: Regan Lemming, MD;  [facility] INVASIVE CV LAB;  Service: Cardiovascular;  Laterality: N/A;   COLECTOMY N/A 02/26/2021   Procedure: PARTIAL COLECTOMY AND COLOSTOMY;  Surgeon: Sheliah Hatch, [name], MD;  Location: [facility] OR;  Service: General;  Laterality: N/A;   COLONOSCOPY W/ BIOPSIES AND POLYPECTOMY  ~ 2000   FRACTURE SURGERY     LAPAROTOMY  02/26/2021   Procedure: EXPLORATORY LAPAROTOMY;  Surgeon: Rodman Pickle, MD;  Location: [facility] OR;  Service: General;;   ORIF FIBULA FRACTURE  06/24/2012   Procedure: OPEN REDUCTION INTERNAL FIXATION (ORIF) FIBULA FRACTURE;  Surgeon: Budd Palmer, MD;  Location: [facility] OR;  Service: Orthopedics;  Laterality: Left;  ORIF LEFT FIBULA,  POSSIBLE REPAIR OF SYNDESMOSIS    TEE WITHOUT CARDIOVERSION N/A 10/03/2019   Procedure: TRANSESOPHAGEAL ECHOCARDIOGRAM (TEE);  Surgeon: Regan Lemming, MD;  Location: [facility] INVASIVE CV LAB;  Service: Cardiovascular;  Laterality: N/A;    Allergies  Allergies  Allergen Reactions   Atorvastatin Other (See Comments)    myalgias    Home Medications    Prior to Admission medications   Medication Sig Start Date End Date Taking? Authorizing Provider  amiodarone (PACERONE) 200 MG tablet Take 1 tablet (200 mg total) by mouth daily. 09/22/21  Camnitz, Will Daphine Deutscher, MD  diltiazem (CARDIZEM CD) 120 MG 24 hr capsule TAKE 1 CAPSULE(120 MG) BY MOUTH DAILY 09/22/21   Camnitz, Will Daphine Deutscher, MD  ELIQUIS 5 MG TABS tablet TAKE 1 TABLET(5 MG) BY MOUTH TWICE DAILY 01/13/22   Camnitz, Andree Coss, MD  ergocalciferol (VITAMIN D2) 1.25 MG (50000 UT) capsule Take 50,000 Units by mouth once a week.    [provider]  nitroGLYCERIN (NITROSTAT) 0.4 MG SL tablet Place 1 tablet (0.4 mg total) under the tongue every 5 (five) minutes as needed for chest pain. 04/19/21   Angiulli, Mcarthur Rossetti, PA-C  tamsulosin (FLOMAX) 0.4 MG CAPS capsule Take 0.4 mg by mouth daily.    [provider]  zolpidem (AMBIEN) 5 MG tablet Take 5 mg by mouth at bedtime.    [provider]    Physical Exam    Vital Signs:  DYLAND PANUCO does not have vital signs available for review today.  Given telephonic nature of communication, physical exam is limited. AAOx3. NAD. Normal affect.  Speech and respirations are unlabored.  Accessory Clinical Findings    None  Assessment & Plan    1.  Preoperative Cardiovascular Risk Assessment:  -Patient's RCRI score is 0.4%  The patient affirms he has been doing well without any new cardiac symptoms. They are able to achieve 5 METS without cardiac limitations. Therefore, based on ACC/AHA guidelines, the patient would be at acceptable risk for the planned procedure without further cardiovascular testing. The patient was advised that if he develops new symptoms prior to surgery to contact our office to arrange for a follow-up visit, and he verbalized understanding.   The patient was advised that if he develops new symptoms prior to surgery to contact our office to arrange for a follow-up visit, and he verbalized understanding.   Per office protocol, patient can hold Eliquis for 2 days prior to procedure.   Patient will not need bridging with Lovenox (enoxaparin) around procedure.   A copy of this note will be routed to requesting surgeon.  Time:   Today, I have spent 6 minutes with the patient with telehealth technology discussing medical history, symptoms, and management plan.     Napoleon Form, Leodis Rains, NP  10/15/2022, 5:56 PM

## 2022-10-16 ENCOUNTER — Ambulatory Visit: Payer: Medicare Other | Attending: Cardiovascular Disease

## 2022-10-16 DIAGNOSIS — Z01810 Encounter for preprocedural cardiovascular examination: Secondary | ICD-10-CM

## 2022-10-16 NOTE — Telephone Encounter (Signed)
Eliquis 5mg  refill request received. Patient is 72 years old, weight-93.8kg, Crea-1.38 on 05/08/22, Diagnosis-Afib, and last seen by Robin Searing on 04/12/22. Dose is appropriate based on dosing criteria. Will send in refill to requested pharmacy.

## 2022-10-19 ENCOUNTER — Other Ambulatory Visit: Payer: Self-pay | Admitting: Cardiology

## 2022-10-19 DIAGNOSIS — I480 Paroxysmal atrial fibrillation: Secondary | ICD-10-CM

## 2022-10-20 NOTE — Telephone Encounter (Signed)
Eliquis 5mg refill request received. Patient is 71 years old, weight-93.8kg, Crea-1.38 on 05/08/22, Diagnosis-Afib, and last seen by Ernest Dick on 04/12/22. Dose is appropriate based on dosing criteria. Will send in refill to requested pharmacy.   

## 2022-11-03 ENCOUNTER — Telehealth: Payer: Self-pay | Admitting: Internal Medicine

## 2022-11-03 NOTE — Telephone Encounter (Signed)
Adelina Mings from [facility] called stating we gave the patient clearance for Coloscopy. Adelina Mings wanted to make sure we are aware that the patient will be having an ostomy reversal. Adelina Mings stated she is faxing over the correct paperwork for clearance.

## 2022-11-08 NOTE — Telephone Encounter (Signed)
I will fax notes to requesting office.  ?

## 2022-11-08 NOTE — Telephone Encounter (Signed)
I have review the chart further and there seems to be a clearance request in the chart for the colostomy reversal, see clearance 08/02/22. I will forward to pre op APP for review.

## 2022-12-14 ENCOUNTER — Other Ambulatory Visit: Payer: Self-pay | Admitting: Cardiology

## 2022-12-14 ENCOUNTER — Ambulatory Visit: Payer: Self-pay | Admitting: General Surgery

## 2022-12-14 DIAGNOSIS — Z433 Encounter for attention to colostomy: Secondary | ICD-10-CM

## 2022-12-27 NOTE — Progress Notes (Addendum)
Anesthesia Review:  PCP: [facility] [address]  Cardiologist : Dietrich Pates- Cardicac clearance 10/16/22 telephone LOV Robin Searing, NP 04/12/22  Chest x-ray : EKG : 01/01/23  Echo : 2022  Afib ablaton - 2021  CT Cors- 2021  Stress test: Cardiac Cath :  Activity level:  can do a flight of stairs without difficutly  Sleep Study/ CPAP : none  Fasting Blood Sugar :      / Checks Blood Sugar -- times a day:   Blood Thinner/ Instructions /Last Dose: ASA / Instructions/ Last Dose :    Eliquis - stop 2 days prior per pt and wife   PT has copy of bowel prep instrucitons from surgeon office.

## 2022-12-29 NOTE — Patient Instructions (Signed)
SURGICAL WAITING ROOM VISITATION  Patients having surgery or a procedure may have no more than 2 support people in the waiting area - these visitors may rotate.    Children under the age of 59 must have an adult with them who is not the patient.  Due to an increase in RSV and influenza rates and associated hospitalizations, children ages 3 and under may not visit patients in [facility] hospitals.  If the patient needs to stay at the hospital during part of their recovery, the visitor guidelines for inpatient rooms apply. Pre-op nurse will coordinate an appropriate time for 1 support person to accompany patient in pre-op.  This support person may not rotate.    Please refer to the [facility] website for the visitor guidelines for Inpatients (after your surgery is over and you are in a regular room).       Your procedure is scheduled on:  01/08/23    Report to [facility] Main Entrance    Report to admitting at  0800 AM   Call this number if you have problems the morning of surgery [phone]    Clear liquid diet on day of bowel prep.     After Midnight you may have the following liquids until __ 0700____ AM/ PM DAY OF SURGERY  Water Non-Citrus Juices (without pulp, NO RED-Apple, White grape, White cranberry) Black Coffee (NO MILK/CREAM OR CREAMERS, sugar ok)  Clear Tea (NO MILK/CREAM OR CREAMERS, sugar ok) regular and decaf                             Plain Jell-O (NO RED)                                           Fruit ices (not with fruit pulp, NO RED)                                     Popsicles (NO RED)                                                               Sports drinks like Gatorade (NO RED)              Drink 2 Ensure/G2 drinks AT 10:00 PM the night before surgery.        The day of surgery:  Drink ONE (1) Pre-Surgery Clear Ensure or G2 at  0700 AM  ( have completed by ) the morning of surgery. Drink in one sitting. Do not sip.  This drink was given  to you during your hospital  pre-op appointment visit. Nothing else to drink after completing the  Pre-Surgery Clear Ensure or G2.          If you have questions, please contact your surgeon's office.   FOLLOW BOWEL PREP AND ANY ADDITIONAL PRE OP INSTRUCTIONS YOU RECEIVED FROM YOUR SURGEON'S OFFICE!!!     Oral Hygiene is also important to reduce your risk of infection.  Remember - BRUSH YOUR TEETH THE MORNING OF SURGERY WITH YOUR REGULAR TOOTHPASTE  DENTURES WILL BE REMOVED PRIOR TO SURGERY PLEASE DO NOT APPLY "Poly grip" OR ADHESIVES!!!   Do NOT smoke after Midnight   Take these medicines the morning of surgery with A SIP OF WATER:  amiodarone, cardizem , flomax   DO NOT TAKE ANY ORAL DIABETIC MEDICATIONS DAY OF YOUR SURGERY  Bring CPAP mask and tubing day of surgery.                              You may not have any metal on your body including hair pins, jewelry, and body piercing             Do not wear make-up, lotions, powders, perfumes/cologne, or deodorant  Do not wear nail polish including gel and S&S, artificial/acrylic nails, or any other type of covering on natural nails including finger and toenails. If you have artificial nails, gel coating, etc. that needs to be removed by a nail salon please have this removed prior to surgery or surgery may need to be canceled/ delayed if the surgeon/ anesthesia feels like they are unable to be safely monitored.   Do not shave  48 hours prior to surgery.               Men may shave face and neck.   Do not bring valuables to the hospital. [facility] IS NOT             RESPONSIBLE   FOR VALUABLES.   Contacts, glasses, dentures or bridgework may not be worn into surgery.   Bring small overnight bag day of surgery.   DO NOT BRING YOUR HOME MEDICATIONS TO THE HOSPITAL. PHARMACY WILL DISPENSE MEDICATIONS LISTED ON YOUR MEDICATION LIST TO YOU DURING YOUR ADMISSION IN THE HOSPITAL!    Patients  discharged on the day of surgery will not be allowed to drive home.  Someone NEEDS to stay with you for the first 24 hours after anesthesia.   Special Instructions: Bring a copy of your healthcare power of attorney and living will documents the day of surgery if you haven't scanned them before.              Please read over the following fact sheets you were given: IF YOU HAVE QUESTIONS ABOUT YOUR PRE-OP INSTRUCTIONS PLEASE CALL [phone]   If you received a COVID test during your pre-op visit  it is requested that you wear a mask when out in public, stay away from anyone that may not be feeling well and notify your surgeon if you develop symptoms. If you test positive for Covid or have been in contact with anyone that has tested positive in the last 10 days please notify you surgeon.    [facility] - Preparing for Surgery Before surgery, you can play an important role.  Because skin is not sterile, your skin needs to be as free of germs as possible.  You can reduce the number of germs on your skin by washing with CHG (chlorahexidine gluconate) soap before surgery.  CHG is an antiseptic cleaner which kills germs and bonds with the skin to continue killing germs even after washing. Please DO NOT use if you have an allergy to CHG or antibacterial soaps.  If your skin becomes reddened/irritated stop using the CHG and inform your nurse when you arrive at Short Stay. Do not shave (including legs  and underarms) for at least 48 hours prior to the first CHG shower.  You may shave your face/neck. Please follow these instructions carefully:  1.  Shower with CHG Soap the night before surgery and the  morning of Surgery.  2.  If you choose to wash your hair, wash your hair first as usual with your  normal  shampoo.  3.  After you shampoo, rinse your hair and body thoroughly to remove the  shampoo.                           4.  Use CHG as you would any other liquid soap.  You can apply chg directly  to the  skin and wash                       Gently with a scrungie or clean washcloth.  5.  Apply the CHG Soap to your body ONLY FROM THE NECK DOWN.   Do not use on face/ open                           Wound or open sores. Avoid contact with eyes, ears mouth and genitals (private parts).                       Wash face,  Genitals (private parts) with your normal soap.             6.  Wash thoroughly, paying special attention to the area where your surgery  will be performed.  7.  Thoroughly rinse your body with warm water from the neck down.  8.  DO NOT shower/wash with your normal soap after using and rinsing off  the CHG Soap.                9.  Pat yourself dry with a clean towel.            10.  Wear clean pajamas.            11.  Place clean sheets on your bed the night of your first shower and do not  sleep with pets. Day of Surgery : Do not apply any lotions/deodorants the morning of surgery.  Please wear clean clothes to the hospital/surgery center.  FAILURE TO FOLLOW THESE INSTRUCTIONS MAY RESULT IN THE CANCELLATION OF YOUR SURGERY PATIENT SIGNATURE_________________________________  NURSE SIGNATURE__________________________________  ________________________________________________________________________

## 2023-01-01 ENCOUNTER — Encounter (HOSPITAL_COMMUNITY): Payer: Self-pay

## 2023-01-01 ENCOUNTER — Encounter (HOSPITAL_COMMUNITY)
Admission: RE | Admit: 2023-01-01 | Discharge: 2023-01-01 | Disposition: A | Discharge status: Home / Self Care | Payer: Medicare Other | Source: Ambulatory Visit | Attending: General Surgery | Admitting: General Surgery

## 2023-01-01 ENCOUNTER — Other Ambulatory Visit: Payer: Self-pay

## 2023-01-01 VITALS — BP 118/67 | HR 60 | Temp 98.0°F | Resp 16 | Ht 72.0 in | Wt 212.0 lb

## 2023-01-01 DIAGNOSIS — K5792 Diverticulitis of intestine, part unspecified, without perforation or abscess without bleeding: Secondary | ICD-10-CM | POA: Insufficient documentation

## 2023-01-01 DIAGNOSIS — Z433 Encounter for attention to colostomy: Secondary | ICD-10-CM | POA: Diagnosis not present

## 2023-01-01 DIAGNOSIS — I129 Hypertensive chronic kidney disease with stage 1 through stage 4 chronic kidney disease, or unspecified chronic kidney disease: Secondary | ICD-10-CM | POA: Insufficient documentation

## 2023-01-01 DIAGNOSIS — R7303 Prediabetes: Secondary | ICD-10-CM | POA: Insufficient documentation

## 2023-01-01 DIAGNOSIS — I4891 Unspecified atrial fibrillation: Secondary | ICD-10-CM | POA: Insufficient documentation

## 2023-01-01 DIAGNOSIS — Z01818 Encounter for other preprocedural examination: Secondary | ICD-10-CM | POA: Diagnosis not present

## 2023-01-01 DIAGNOSIS — N183 Chronic kidney disease, stage 3 unspecified: Secondary | ICD-10-CM | POA: Diagnosis not present

## 2023-01-01 DIAGNOSIS — Z87891 Personal history of nicotine dependence: Secondary | ICD-10-CM | POA: Diagnosis not present

## 2023-01-01 HISTORY — DX: Prediabetes: R73.03

## 2023-01-01 HISTORY — DX: Unspecified osteoarthritis, unspecified site: M19.90

## 2023-01-01 HISTORY — DX: Chronic kidney disease, unspecified: N18.9

## 2023-01-01 LAB — CBC
HCT: 44.8 % (ref 39.0–52.0)
Hemoglobin: 14.9 g/dL (ref 13.0–17.0)
MCH: 31.4 pg (ref 26.0–34.0)
MCHC: 33.3 g/dL (ref 30.0–36.0)
MCV: 94.5 fL (ref 80.0–100.0)
Platelets: 261 10*3/uL (ref 150–400)
RBC: 4.74 MIL/uL (ref 4.22–5.81)
RDW: 13.9 % (ref 11.5–15.5)
WBC: 10.3 10*3/uL (ref 4.0–10.5)
nRBC: 0 % (ref 0.0–0.2)

## 2023-01-01 LAB — BASIC METABOLIC PANEL
Anion gap: 8 (ref 5–15)
BUN: 17 mg/dL (ref 8–23)
CO2: 24 mmol/L (ref 22–32)
Calcium: 9 mg/dL (ref 8.9–10.3)
Chloride: 105 mmol/L (ref 98–111)
Creatinine, Ser: 1.41 mg/dL — ABNORMAL HIGH (ref 0.61–1.24)
GFR, Estimated: 53 mL/min — ABNORMAL LOW (ref >60)
Glucose, Bld: 104 mg/dL — ABNORMAL HIGH (ref 70–99)
Potassium: 4.4 mmol/L (ref 3.5–5.1)
Sodium: 137 mmol/L (ref 135–145)

## 2023-01-02 LAB — HEMOGLOBIN A1C
Hgb A1c MFr Bld: 6.2 % — ABNORMAL HIGH (ref 4.8–5.6)
Mean Plasma Glucose: 131 mg/dL

## 2023-01-02 NOTE — Anesthesia Preprocedure Evaluation (Addendum)
Anesthesia Evaluation  Patient identified by MRN, date of birth, ID band Patient awake    Reviewed: Allergy & Precautions, H&P , NPO status , Patient's Chart, lab work & pertinent test results  Airway Mallampati: II  TM Distance: >3 FB Neck ROM: Full    Dental no notable dental hx.    Pulmonary neg pulmonary ROS, former smoker   Pulmonary exam normal breath sounds clear to auscultation       Cardiovascular hypertension, Normal cardiovascular exam+ dysrhythmias Atrial Fibrillation  Rhythm:Regular Rate:Normal     Neuro/Psych negative neurological ROS  negative psych ROS   GI/Hepatic negative GI ROS, Neg liver ROS,,,  Endo/Other  negative endocrine ROS    Renal/GU Renal InsufficiencyRenal disease  negative genitourinary   Musculoskeletal negative musculoskeletal ROS (+)    Abdominal   Peds negative pediatric ROS (+)  Hematology negative hematology ROS (+)   Anesthesia Other Findings   Reproductive/Obstetrics negative OB ROS                             Anesthesia Physical Anesthesia Plan  ASA: 3  Anesthesia Plan: General   Post-op Pain Management: Tylenol PO (pre-op)*   Induction: Intravenous  PONV Risk Score and Plan: 2 and Ondansetron, Dexamethasone and Treatment may vary due to age or medical condition  Airway Management Planned: Oral ETT  Additional Equipment:   Intra-op Plan:   Post-operative Plan: Extubation in OR  Informed Consent: I have reviewed the patients History and Physical, chart, labs and discussed the procedure including the risks, benefits and alternatives for the proposed anesthesia with the patient or authorized representative who has indicated his/her understanding and acceptance.     Dental advisory given  Plan Discussed with: CRNA and Surgeon  Anesthesia Plan Comments: (See PAT note 01/01/2023)       Anesthesia Quick Evaluation

## 2023-01-02 NOTE — Progress Notes (Signed)
**Note Travis-Identified via Obfuscation** Anesthesia Chart Review   Case: [id number] Date/Time: 01/08/23 0945   Procedure: LAPAROSCOPIC COLOSTOMY REVERSAL WITH COLORECTAL ANASTOMOSIS   Anesthesia type: General   Pre-op diagnosis: diverticulitis   Location: [facility] ROOM 05 / [facility]   Surgeons: Travis Conrad, Travis Blanch, MD       DISCUSSION:71 y.o. former smoker with h/o HTN, CKD Stage III, atrial fibrillation, diverticulitis s/p perforation and abscess treated with sigmoid colectomy 03/2021 scheduled for above procedure 01/08/23 with Dr. Feliciana Conrad.   Pt last seen by cardiology 10/16/22. Per OV note, "-Patient's RCRI score is 0.4%   The patient affirms he has been doing well without any new cardiac symptoms. They are able to achieve 5 METS without cardiac limitations. Therefore, based on ACC/AHA guidelines, the patient would be at acceptable risk for the planned procedure without further cardiovascular testing. The patient was advised that if he develops new symptoms prior to surgery to contact our office to arrange for a follow-up visit, and he verbalized understanding. Per office protocol, patient can hold Eliquis for 2 days prior to procedure.   Patient will not need bridging with Lovenox (enoxaparin) around procedure."  VS: BP 118/67   Pulse 60   Temp 36.7 C (Oral)   Resp 16   Ht 6' (1.829 m)   Wt 96.2 kg   SpO2 96%   BMI 28.75 kg/m   PROVIDERS: Travis Clark, MD is PCP   Cardiologist : Travis Pates, MD  LABS: Labs reviewed: Acceptable for surgery. (all labs ordered are listed, but only abnormal results are displayed)  Labs Reviewed  HEMOGLOBIN A1C - Abnormal; Notable for the following components:      Result Value   Hgb A1c MFr Bld 6.2 (*)    All other components within normal limits  BASIC METABOLIC PANEL - Abnormal; Notable for the following components:   Glucose, Bld 104 (*)    Creatinine, Ser 1.41 (*)    GFR, Estimated 53 (*)    All other components within normal limits  CBC  TYPE AND SCREEN      IMAGES:   EKG:   CV: Echo 02/23/21 1. Left ventricular ejection fraction, by estimation, is 60 to 65%. The  left ventricle has normal function. The left ventricle has no regional  wall motion abnormalities. There is moderate left ventricular hypertrophy  of the basal-septal segment. Left  ventricular diastolic parameters were normal.   2. Right ventricular systolic function is normal. The right ventricular  size is normal. Tricuspid regurgitation signal is inadequate for assessing  PA pressure.   3. The mitral valve is normal in structure. Trivial mitral valve  regurgitation. No evidence of mitral stenosis.   4. The aortic valve is normal in structure. Aortic valve regurgitation is  not visualized. No aortic stenosis is present.  Past Medical History:  Diagnosis Date   Arthritis    Atrial fibrillation (HCC)    Chronic kidney disease    stage 3   Dysrhythmia    a-fib   High cholesterol    "RX made groin hurt" (08/14/2016)   Hypertension    Pre-diabetes     Past Surgical History:  Procedure Laterality Date   ANKLE FRACTURE SURGERY Left ~ 2012   "put rod and pins in"   ATRIAL FIBRILLATION ABLATION N/A 10/03/2019   Procedure: ATRIAL FIBRILLATION ABLATION;  Surgeon: Travis Lemming, MD;  [facility] INVASIVE CV LAB;  Service: Cardiovascular;  Laterality: N/A;   COLECTOMY N/A 02/26/2021   Procedure: PARTIAL COLECTOMY AND COLOSTOMY;  Surgeon: Travis Conrad, Travis Blanch, MD;  Location: [facility] OR;  Service: General;  Laterality: N/A;   COLONOSCOPY W/ BIOPSIES AND POLYPECTOMY  ~ 2000   FRACTURE SURGERY     LAPAROTOMY  02/26/2021   Procedure: EXPLORATORY LAPAROTOMY;  Surgeon: Travis Conrad Travis Blanch, MD;  Location: [facility] OR;  Service: General;;   ORIF FIBULA FRACTURE  06/24/2012   Procedure: OPEN REDUCTION INTERNAL FIXATION (ORIF) FIBULA FRACTURE;  Surgeon: Travis Palmer, MD;  Location: [facility] OR;  Service: Orthopedics;  Laterality: Left;  ORIF LEFT FIBULA,  POSSIBLE REPAIR OF SYNDESMOSIS     TEE WITHOUT CARDIOVERSION N/A 10/03/2019   Procedure: TRANSESOPHAGEAL ECHOCARDIOGRAM (TEE);  Surgeon: Travis Lemming, MD;  Location: [facility] INVASIVE CV LAB;  Service: Cardiovascular;  Laterality: N/A;    MEDICATIONS:  amiodarone (PACERONE) 200 MG tablet   apixaban (ELIQUIS) 5 MG TABS tablet   aspirin EC 325 MG tablet   diclofenac Sodium (VOLTAREN) 1 % GEL   diltiazem (CARDIZEM CD) 120 MG 24 hr capsule   doxylamine, Sleep, (UNISOM) 25 MG tablet   ezetimibe (ZETIA) 10 MG tablet   fluticasone (FLONASE) 50 MCG/ACT nasal spray   Melatonin 10 MG TBCR   nitroGLYCERIN (NITROSTAT) 0.4 MG SL tablet   OVER THE COUNTER MEDICATION   OVER THE COUNTER MEDICATION   OVER THE COUNTER MEDICATION   OVER THE COUNTER MEDICATION   Polyvinyl Alcohol-Povidone (REFRESH OP)   tamsulosin (FLOMAX) 0.4 MG CAPS capsule   triamcinolone cream (KENALOG) 0.1 %   zolpidem (AMBIEN) 10 MG tablet   No current facility-administered medications for this encounter.     Travis Cipro Ward, PA-C [facility] Pre-Surgical Testing [phone]

## 2023-01-08 ENCOUNTER — Inpatient Hospital Stay (HOSPITAL_COMMUNITY): Payer: Medicare Other | Admitting: Physician Assistant

## 2023-01-08 ENCOUNTER — Other Ambulatory Visit: Payer: Self-pay

## 2023-01-08 ENCOUNTER — Inpatient Hospital Stay (HOSPITAL_COMMUNITY)
Admission: RE | Admit: 2023-01-08 | Discharge: 2023-01-13 | DRG: 331 | Disposition: A | Discharge status: Home / Self Care | Payer: Medicare Other | Source: Ambulatory Visit | Attending: General Surgery | Admitting: General Surgery

## 2023-01-08 ENCOUNTER — Encounter (HOSPITAL_COMMUNITY)
Admission: RE | Disposition: A | Discharge status: Home / Self Care | Payer: Self-pay | Source: Ambulatory Visit | Attending: General Surgery

## 2023-01-08 ENCOUNTER — Encounter (HOSPITAL_COMMUNITY): Payer: Self-pay | Admitting: General Surgery

## 2023-01-08 ENCOUNTER — Inpatient Hospital Stay (HOSPITAL_COMMUNITY): Payer: Medicare Other | Admitting: Anesthesiology

## 2023-01-08 DIAGNOSIS — M1990 Unspecified osteoarthritis, unspecified site: Secondary | ICD-10-CM | POA: Diagnosis present

## 2023-01-08 DIAGNOSIS — Z7901 Long term (current) use of anticoagulants: Secondary | ICD-10-CM

## 2023-01-08 DIAGNOSIS — I10 Essential (primary) hypertension: Secondary | ICD-10-CM

## 2023-01-08 DIAGNOSIS — Z8719 Personal history of other diseases of the digestive system: Secondary | ICD-10-CM | POA: Diagnosis not present

## 2023-01-08 DIAGNOSIS — Z87891 Personal history of nicotine dependence: Secondary | ICD-10-CM | POA: Diagnosis not present

## 2023-01-08 DIAGNOSIS — K660 Peritoneal adhesions (postprocedural) (postinfection): Secondary | ICD-10-CM

## 2023-01-08 DIAGNOSIS — K435 Parastomal hernia without obstruction or  gangrene: Secondary | ICD-10-CM | POA: Diagnosis present

## 2023-01-08 DIAGNOSIS — Z888 Allergy status to other drugs, medicaments and biological substances status: Secondary | ICD-10-CM | POA: Diagnosis not present

## 2023-01-08 DIAGNOSIS — K432 Incisional hernia without obstruction or gangrene: Secondary | ICD-10-CM | POA: Diagnosis present

## 2023-01-08 DIAGNOSIS — F419 Anxiety disorder, unspecified: Secondary | ICD-10-CM | POA: Diagnosis present

## 2023-01-08 DIAGNOSIS — Z79899 Other long term (current) drug therapy: Secondary | ICD-10-CM

## 2023-01-08 DIAGNOSIS — I480 Paroxysmal atrial fibrillation: Secondary | ICD-10-CM

## 2023-01-08 DIAGNOSIS — Z433 Encounter for attention to colostomy: Principal | ICD-10-CM

## 2023-01-08 DIAGNOSIS — Z01818 Encounter for other preprocedural examination: Secondary | ICD-10-CM

## 2023-01-08 DIAGNOSIS — L309 Dermatitis, unspecified: Secondary | ICD-10-CM | POA: Diagnosis present

## 2023-01-08 DIAGNOSIS — Z9049 Acquired absence of other specified parts of digestive tract: Secondary | ICD-10-CM

## 2023-01-08 HISTORY — PX: COLOSTOMY TAKEDOWN: SHX5258

## 2023-01-08 LAB — TYPE AND SCREEN
ABO/RH(D): A POS
Antibody Screen: NEGATIVE

## 2023-01-08 LAB — CBC
HCT: 42.7 % (ref 39.0–52.0)
Hemoglobin: 14.3 g/dL (ref 13.0–17.0)
MCH: 31.4 pg (ref 26.0–34.0)
MCHC: 33.5 g/dL (ref 30.0–36.0)
MCV: 93.8 fL (ref 80.0–100.0)
Platelets: 262 10*3/uL (ref 150–400)
RBC: 4.55 MIL/uL (ref 4.22–5.81)
RDW: 13.9 % (ref 11.5–15.5)
WBC: 18.8 10*3/uL — ABNORMAL HIGH (ref 4.0–10.5)
nRBC: 0 % (ref 0.0–0.2)

## 2023-01-08 LAB — CREATININE, SERUM
Creatinine, Ser: 1.58 mg/dL — ABNORMAL HIGH (ref 0.61–1.24)
GFR, Estimated: 46 mL/min — ABNORMAL LOW (ref >60)

## 2023-01-08 SURGERY — CLOSURE, COLOSTOMY, LAPAROSCOPIC
Anesthesia: General

## 2023-01-08 MED ORDER — PHENYLEPHRINE HCL (PRESSORS) 10 MG/ML IV SOLN
INTRAVENOUS | Status: AC
  Filled 2023-01-08: qty 1

## 2023-01-08 MED ORDER — SODIUM CHLORIDE 0.9 % IV SOLN
2.0000 g | INTRAVENOUS | Status: AC
  Administered 2023-01-08: 2 g via INTRAVENOUS
  Filled 2023-01-08: qty 2

## 2023-01-08 MED ORDER — DEXAMETHASONE SODIUM PHOSPHATE 10 MG/ML IJ SOLN
INTRAMUSCULAR | Status: AC
  Filled 2023-01-08: qty 1

## 2023-01-08 MED ORDER — 0.9 % SODIUM CHLORIDE (POUR BTL) OPTIME
TOPICAL | Status: DC | PRN
  Administered 2023-01-08: 1000 mL

## 2023-01-08 MED ORDER — BUPIVACAINE-EPINEPHRINE 0.25% -1:200000 IJ SOLN
INTRAMUSCULAR | Status: AC
  Filled 2023-01-08: qty 1

## 2023-01-08 MED ORDER — LIDOCAINE HCL (PF) 2 % IJ SOLN
INTRAMUSCULAR | Status: DC | PRN
  Administered 2023-01-08: 1.5 mg/kg/h via INTRADERMAL

## 2023-01-08 MED ORDER — ZOLPIDEM TARTRATE 5 MG PO TABS
10.0000 mg | ORAL_TABLET | Freq: Every day | ORAL | Status: DC
  Administered 2023-01-08 – 2023-01-12 (×5): 10 mg via ORAL
  Filled 2023-01-08 (×5): qty 2

## 2023-01-08 MED ORDER — ROCURONIUM BROMIDE 100 MG/10ML IV SOLN
INTRAVENOUS | Status: DC | PRN
  Administered 2023-01-08: 80 mg via INTRAVENOUS
  Administered 2023-01-08: 10 mg via INTRAVENOUS

## 2023-01-08 MED ORDER — ONDANSETRON HCL 4 MG PO TABS: 4.0000 mg | ORAL_TABLET | Freq: Four times a day (QID) | ORAL | Status: DC | PRN

## 2023-01-08 MED ORDER — HEPARIN SODIUM (PORCINE) 5000 UNIT/ML IJ SOLN
5000.0000 [IU] | Freq: Once | INTRAMUSCULAR | Status: AC
  Administered 2023-01-08: 5000 [IU] via SUBCUTANEOUS
  Filled 2023-01-08: qty 1

## 2023-01-08 MED ORDER — ACETAMINOPHEN 500 MG PO TABS: 1000.0000 mg | ORAL_TABLET | Freq: Once | ORAL | Status: DC

## 2023-01-08 MED ORDER — HYDROMORPHONE HCL 2 MG/ML IJ SOLN
INTRAMUSCULAR | Status: AC
  Filled 2023-01-08: qty 1

## 2023-01-08 MED ORDER — HYDROMORPHONE HCL 1 MG/ML IJ SOLN
INTRAMUSCULAR | Status: DC | PRN
  Administered 2023-01-08: 1 mg via INTRAVENOUS
  Administered 2023-01-08: .5 mg via INTRAVENOUS

## 2023-01-08 MED ORDER — PROPOFOL 10 MG/ML IV BOLUS
INTRAVENOUS | Status: DC | PRN
Start: 2023-01-08 — End: 2023-01-08
  Administered 2023-01-08: 200 mg via INTRAVENOUS

## 2023-01-08 MED ORDER — NEOMYCIN SULFATE 500 MG PO TABS
1000.0000 mg | ORAL_TABLET | ORAL | Status: DC
  Filled 2023-01-08: qty 2

## 2023-01-08 MED ORDER — ENOXAPARIN SODIUM 40 MG/0.4ML IJ SOSY
40.0000 mg | PREFILLED_SYRINGE | INTRAMUSCULAR | Status: DC
  Administered 2023-01-09 – 2023-01-13 (×5): 40 mg via SUBCUTANEOUS
  Filled 2023-01-08 (×5): qty 0.4

## 2023-01-08 MED ORDER — PROPOFOL 10 MG/ML IV BOLUS
INTRAVENOUS | Status: AC
  Filled 2023-01-08: qty 20

## 2023-01-08 MED ORDER — ONDANSETRON HCL 4 MG/2ML IJ SOLN
INTRAMUSCULAR | Status: DC | PRN
  Administered 2023-01-08: 4 mg via INTRAVENOUS

## 2023-01-08 MED ORDER — LACTATED RINGERS IV SOLN: INTRAVENOUS | Status: DC | PRN

## 2023-01-08 MED ORDER — MIDAZOLAM HCL 5 MG/5ML IJ SOLN
INTRAMUSCULAR | Status: DC | PRN
  Administered 2023-01-08: 2 mg via INTRAVENOUS

## 2023-01-08 MED ORDER — SUGAMMADEX SODIUM 200 MG/2ML IV SOLN
INTRAVENOUS | Status: DC | PRN
  Administered 2023-01-08: 200 mg via INTRAVENOUS

## 2023-01-08 MED ORDER — DOXYLAMINE SUCCINATE (SLEEP) 25 MG PO TABS
25.0000 mg | ORAL_TABLET | Freq: Every day | ORAL | Status: DC
  Administered 2023-01-08 – 2023-01-12 (×5): 25 mg via ORAL
  Filled 2023-01-08 (×5): qty 1

## 2023-01-08 MED ORDER — ALVIMOPAN 12 MG PO CAPS
12.0000 mg | ORAL_CAPSULE | ORAL | Status: AC
  Administered 2023-01-08: 12 mg via ORAL
  Filled 2023-01-08: qty 1

## 2023-01-08 MED ORDER — ROCURONIUM BROMIDE 10 MG/ML (PF) SYRINGE
PREFILLED_SYRINGE | INTRAVENOUS | Status: AC
  Filled 2023-01-08: qty 10

## 2023-01-08 MED ORDER — DEXAMETHASONE SODIUM PHOSPHATE 10 MG/ML IJ SOLN
INTRAMUSCULAR | Status: DC | PRN
  Administered 2023-01-08: 8 mg via INTRAVENOUS

## 2023-01-08 MED ORDER — BUPIVACAINE LIPOSOME 1.3 % IJ SUSP
INTRAMUSCULAR | Status: AC
  Filled 2023-01-08: qty 20

## 2023-01-08 MED ORDER — ORAL CARE MOUTH RINSE: 15.0000 mL | Freq: Once | OROMUCOSAL | Status: AC

## 2023-01-08 MED ORDER — LACTATED RINGERS IV SOLN: INTRAVENOUS | Status: DC

## 2023-01-08 MED ORDER — OXYCODONE HCL 5 MG PO TABS
5.0000 mg | ORAL_TABLET | Freq: Four times a day (QID) | ORAL | Status: DC | PRN
  Administered 2023-01-08 – 2023-01-12 (×10): 5 mg via ORAL
  Filled 2023-01-08 (×11): qty 1

## 2023-01-08 MED ORDER — DILTIAZEM HCL ER COATED BEADS 120 MG PO CP24
120.0000 mg | ORAL_CAPSULE | Freq: Every day | ORAL | Status: DC
  Administered 2023-01-08 – 2023-01-13 (×6): 120 mg via ORAL
  Filled 2023-01-08 (×6): qty 1

## 2023-01-08 MED ORDER — LIDOCAINE HCL (CARDIAC) PF 100 MG/5ML IV SOSY
PREFILLED_SYRINGE | INTRAVENOUS | Status: DC | PRN
  Administered 2023-01-08: 60 mg via INTRAVENOUS

## 2023-01-08 MED ORDER — BUPIVACAINE LIPOSOME 1.3 % IJ SUSP: 20.0000 mL | Freq: Once | INTRAMUSCULAR | Status: DC

## 2023-01-08 MED ORDER — METRONIDAZOLE 500 MG PO TABS
1000.0000 mg | ORAL_TABLET | ORAL | Status: DC
  Filled 2023-01-08: qty 2

## 2023-01-08 MED ORDER — METOPROLOL TARTRATE 5 MG/5ML IV SOLN: 5.0000 mg | Freq: Four times a day (QID) | INTRAVENOUS | Status: DC | PRN

## 2023-01-08 MED ORDER — FLUTICASONE PROPIONATE 50 MCG/ACT NA SUSP: 1.0000 | NASAL | Status: DC | PRN

## 2023-01-08 MED ORDER — MELATONIN 5 MG PO TABS
10.0000 mg | ORAL_TABLET | Freq: Every evening | ORAL | Status: DC | PRN
  Administered 2023-01-09 – 2023-01-13 (×3): 10 mg via ORAL
  Filled 2023-01-08 (×3): qty 2

## 2023-01-08 MED ORDER — GABAPENTIN 300 MG PO CAPS
300.0000 mg | ORAL_CAPSULE | ORAL | Status: AC
  Administered 2023-01-08: 300 mg via ORAL
  Filled 2023-01-08: qty 1

## 2023-01-08 MED ORDER — LIDOCAINE HCL (PF) 2 % IJ SOLN
INTRAMUSCULAR | Status: AC
  Filled 2023-01-08: qty 10

## 2023-01-08 MED ORDER — SODIUM CHLORIDE 0.45 % IV SOLN: INTRAVENOUS | Status: DC

## 2023-01-08 MED ORDER — ACETAMINOPHEN 500 MG PO TABS
1000.0000 mg | ORAL_TABLET | ORAL | Status: AC
  Administered 2023-01-08: 1000 mg via ORAL
  Filled 2023-01-08: qty 2

## 2023-01-08 MED ORDER — BUPIVACAINE-EPINEPHRINE 0.25% -1:200000 IJ SOLN
INTRAMUSCULAR | Status: DC | PRN
  Administered 2023-01-08: 30 mL

## 2023-01-08 MED ORDER — ENSURE PRE-SURGERY PO LIQD
296.0000 mL | Freq: Once | ORAL | Status: DC
  Filled 2023-01-08: qty 296

## 2023-01-08 MED ORDER — BUPIVACAINE LIPOSOME 1.3 % IJ SUSP
INTRAMUSCULAR | Status: DC | PRN
  Administered 2023-01-08: 20 mL

## 2023-01-08 MED ORDER — FENTANYL CITRATE (PF) 100 MCG/2ML IJ SOLN
INTRAMUSCULAR | Status: DC | PRN
  Administered 2023-01-08: 100 ug via INTRAVENOUS
  Administered 2023-01-08: 50 ug via INTRAVENOUS

## 2023-01-08 MED ORDER — MORPHINE SULFATE (PF) 2 MG/ML IV SOLN
2.0000 mg | INTRAVENOUS | Status: DC | PRN
  Administered 2023-01-08 – 2023-01-09 (×2): 2 mg via INTRAVENOUS
  Administered 2023-01-09: 4 mg via INTRAVENOUS
  Administered 2023-01-11: 2 mg via INTRAVENOUS
  Administered 2023-01-11: 4 mg via INTRAVENOUS
  Administered 2023-01-12: 2 mg via INTRAVENOUS
  Filled 2023-01-08: qty 2
  Filled 2023-01-08 (×3): qty 1
  Filled 2023-01-08: qty 2
  Filled 2023-01-08: qty 1

## 2023-01-08 MED ORDER — ENSURE PRE-SURGERY PO LIQD
592.0000 mL | Freq: Once | ORAL | Status: DC
  Filled 2023-01-08: qty 592

## 2023-01-08 MED ORDER — MIDAZOLAM HCL 2 MG/2ML IJ SOLN
INTRAMUSCULAR | Status: AC
  Filled 2023-01-08: qty 2

## 2023-01-08 MED ORDER — AMIODARONE HCL 200 MG PO TABS
200.0000 mg | ORAL_TABLET | Freq: Two times a day (BID) | ORAL | Status: DC
  Administered 2023-01-08 – 2023-01-13 (×11): 200 mg via ORAL
  Filled 2023-01-08 (×11): qty 1

## 2023-01-08 MED ORDER — FENTANYL CITRATE (PF) 250 MCG/5ML IJ SOLN
INTRAMUSCULAR | Status: AC
  Filled 2023-01-08: qty 5

## 2023-01-08 MED ORDER — CHLORHEXIDINE GLUCONATE 0.12 % MT SOLN
15.0000 mL | Freq: Once | OROMUCOSAL | Status: AC
  Administered 2023-01-08: 15 mL via OROMUCOSAL

## 2023-01-08 MED ORDER — ONDANSETRON HCL 4 MG/2ML IJ SOLN: 4.0000 mg | Freq: Four times a day (QID) | INTRAMUSCULAR | Status: DC | PRN

## 2023-01-08 MED ORDER — ALVIMOPAN 12 MG PO CAPS
12.0000 mg | ORAL_CAPSULE | Freq: Two times a day (BID) | ORAL | Status: DC
  Administered 2023-01-09 – 2023-01-10 (×3): 12 mg via ORAL
  Filled 2023-01-08 (×4): qty 1

## 2023-01-08 MED ORDER — ENSURE SURGERY PO LIQD
237.0000 mL | Freq: Two times a day (BID) | ORAL | Status: DC
  Administered 2023-01-08 – 2023-01-12 (×7): 237 mL via ORAL

## 2023-01-08 MED ORDER — CHLORHEXIDINE GLUCONATE 4 % EX SOLN: 1.0000 | Freq: Once | CUTANEOUS | Status: DC

## 2023-01-08 MED ORDER — TAMSULOSIN HCL 0.4 MG PO CAPS
0.4000 mg | ORAL_CAPSULE | Freq: Every day | ORAL | Status: DC
  Administered 2023-01-08 – 2023-01-13 (×6): 0.4 mg via ORAL
  Filled 2023-01-08 (×6): qty 1

## 2023-01-08 MED ORDER — ONDANSETRON HCL 4 MG/2ML IJ SOLN
INTRAMUSCULAR | Status: AC
  Filled 2023-01-08: qty 2

## 2023-01-08 MED ORDER — LACTATED RINGERS IR SOLN
Status: DC | PRN
  Administered 2023-01-08: 1000 mL

## 2023-01-08 SURGICAL SUPPLY — 75 items
APL PRP STRL LF DISP 70% ISPRP (MISCELLANEOUS) ×1
APL SKNCLS STERI-STRIP NONHPOA (GAUZE/BANDAGES/DRESSINGS)
APPLIER CLIP 5 13 M/L LIGAMAX5 (MISCELLANEOUS)
APPLIER CLIP ROT 10 11.4 M/L (STAPLE)
APR CLP MED LRG 11.4X10 (STAPLE)
APR CLP MED LRG 5 ANG JAW (MISCELLANEOUS)
BAG COUNTER SPONGE SURGICOUNT (BAG) IMPLANT
BAG SPNG CNTER NS LX DISP (BAG)
BENZOIN TINCTURE PRP APPL 2/3 (GAUZE/BANDAGES/DRESSINGS) IMPLANT
BLADE EXTENDED COATED 6.5IN (ELECTRODE) IMPLANT
BNDG ADH 1X3 SHEER STRL LF (GAUZE/BANDAGES/DRESSINGS) IMPLANT
BNDG ADH THN 3X1 STRL LF (GAUZE/BANDAGES/DRESSINGS)
CABLE HIGH FREQUENCY MONO STRZ (ELECTRODE) IMPLANT
CELLS DAT CNTRL 66122 CELL SVR (MISCELLANEOUS) IMPLANT
CHLORAPREP W/TINT 26 (MISCELLANEOUS) ×2 IMPLANT
CLIP APPLIE 5 13 M/L LIGAMAX5 (MISCELLANEOUS) IMPLANT
CLIP APPLIE ROT 10 11.4 M/L (STAPLE) IMPLANT
COVER SURGICAL LIGHT HANDLE (MISCELLANEOUS) ×2 IMPLANT
DRAIN CHANNEL 19F RND (DRAIN) IMPLANT
DRSG OPSITE POSTOP 4X10 (GAUZE/BANDAGES/DRESSINGS) IMPLANT
DRSG OPSITE POSTOP 4X6 (GAUZE/BANDAGES/DRESSINGS) IMPLANT
DRSG OPSITE POSTOP 4X8 (GAUZE/BANDAGES/DRESSINGS) IMPLANT
ELECT REM PT RETURN 15FT ADLT (MISCELLANEOUS) ×2 IMPLANT
ENDOLOOP SUT PDS II 0 18 (SUTURE) IMPLANT
GAUZE SPONGE 4X4 12PLY STRL (GAUZE/BANDAGES/DRESSINGS) IMPLANT
GLOVE BIOGEL PI IND STRL 7.0 (GLOVE) ×4 IMPLANT
GLOVE SURG SS PI 7.0 STRL IVOR (GLOVE) ×4 IMPLANT
GOWN STRL REUS W/ TWL LRG LVL3 (GOWN DISPOSABLE) ×4 IMPLANT
GOWN STRL REUS W/TWL LRG LVL3 (GOWN DISPOSABLE) ×2
IRRIG SUCT STRYKERFLOW 2 WTIP (MISCELLANEOUS) ×1
IRRIGATION SUCT STRKRFLW 2 WTP (MISCELLANEOUS) ×2 IMPLANT
KIT TURNOVER KIT A (KITS) IMPLANT
PACK COLON (CUSTOM PROCEDURE TRAY) ×2 IMPLANT
PAD POSITIONING PINK XL (MISCELLANEOUS) IMPLANT
PENCIL SMOKE EVACUATOR (MISCELLANEOUS) ×2 IMPLANT
PROTECTOR NERVE ULNAR (MISCELLANEOUS) IMPLANT
RELOAD STAPLE 60 2.6 WHT THN (STAPLE) IMPLANT
RELOAD STAPLE 60 3.6 BLU REG (STAPLE) IMPLANT
RELOAD STAPLER BLUE 60MM (STAPLE) ×1 IMPLANT
RELOAD STAPLER WHITE 60MM (STAPLE) IMPLANT
RETRACTOR WND ALEXIS 18 MED (MISCELLANEOUS) IMPLANT
RTRCTR WOUND ALEXIS 18CM MED (MISCELLANEOUS)
SCISSORS LAP 5X35 DISP (ENDOMECHANICALS) ×2 IMPLANT
SET TUBE SMOKE EVAC HIGH FLOW (TUBING) ×2 IMPLANT
SHEARS HARMONIC ACE PLUS 36CM (ENDOMECHANICALS) ×2 IMPLANT
SLEEVE Z-THREAD 5X100MM (TROCAR) ×4 IMPLANT
SPIKE FLUID TRANSFER (MISCELLANEOUS) ×2 IMPLANT
STAPLE ECHEON FLEX 60 POW ENDO (STAPLE) IMPLANT
STAPLER ECHELON LONG 60 440 (INSTRUMENTS) IMPLANT
STAPLER ECHELON POWER CIR 29 (STAPLE) IMPLANT
STAPLER RELOAD BLUE 60MM (STAPLE) ×1
STAPLER RELOAD WHITE 60MM (STAPLE)
STAPLER VISISTAT 35W (STAPLE) IMPLANT
STRIP CLOSURE SKIN 1/2X4 (GAUZE/BANDAGES/DRESSINGS) IMPLANT
SUT PDS AB 0 CT1 36 (SUTURE) IMPLANT
SUT PROLENE 2 0 KS (SUTURE) IMPLANT
SUT SILK 2 0 (SUTURE) ×1
SUT SILK 2 0 SH CR/8 (SUTURE) ×2 IMPLANT
SUT SILK 2-0 18XBRD TIE 12 (SUTURE) ×2 IMPLANT
SUT SILK 3 0 (SUTURE) ×1
SUT SILK 3 0 SH 30 (SUTURE) IMPLANT
SUT SILK 3 0 SH CR/8 (SUTURE) ×2 IMPLANT
SUT SILK 3-0 18XBRD TIE 12 (SUTURE) ×2 IMPLANT
SUT VIC AB 2-0 SH 27 (SUTURE) ×1
SUT VIC AB 2-0 SH 27X BRD (SUTURE) ×2 IMPLANT
SUT VICRYL 0 ENDOLOOP (SUTURE) IMPLANT
SYS LAPSCP GELPORT 120MM (MISCELLANEOUS)
SYS WOUND ALEXIS 18CM MED (MISCELLANEOUS) ×1
SYSTEM LAPSCP GELPORT 120MM (MISCELLANEOUS) IMPLANT
SYSTEM WOUND ALEXIS 18CM MED (MISCELLANEOUS) IMPLANT
TAPE CLOTH 4X10 WHT NS (GAUZE/BANDAGES/DRESSINGS) IMPLANT
TOWEL OR NON WOVEN STRL DISP B (DISPOSABLE) ×2 IMPLANT
TRAY FOLEY MTR SLVR 16FR STAT (SET/KITS/TRAYS/PACK) IMPLANT
TROCAR ADV FIXATION 12X100MM (TROCAR) ×2 IMPLANT
TROCAR Z-THREAD OPTICAL 5X100M (TROCAR) ×2 IMPLANT

## 2023-01-08 NOTE — Op Note (Signed)
Preoperative diagnosis: colostomy care  Postoperative diagnosis: same   Procedure: laparoscopic colostomy takedown with colorectal anastomosis, lap partial colon resection, lap lysis of adhesions  Surgeon: Feliciana Rossetti, M.D.  Asst: Saunders Glance, PAC  Anesthesia: GETA  Indications for procedure: Travis Conrad is a 72 y.o. year old male with symptoms of colostomy after diverticulitis complication. He presents today for reversal.  Description of procedure: The patient was brought into the operative suite. Anesthesia was administered with General endotracheal anesthesia. WHO checklist was applied. The patient was then placed in lithotomy position. The area was prepped and draped in the usual sterile fashion.  Next, a small transverse incision was made in the right upper quadrant.  5 mm trocar was used to gain access to the peritoneal cavity with an optical entry technique.  Pneumo insufflation started with a high flow and low opening pressure.  Upon initial visualization of the abdomen, there were multiple adhesions of the omentum to the abdominal wall.  There is a small incisional hernia as well as a parastomal hernia.  One 5 mm trocar was placed in the right mid abdomen and one 12 mm trocar was placed in the right lower abdomen.  Sharp dissection was used to dissect the omentum away from the abdominal wall.  Sharp dissection was then used to dissect the small intestine away from the abdominal wall and left sidewall and pelvis well.  Colostomy was inspected.  There is omentum going up into the parastomal hernia sac.  This was dissected bluntly and with sharp dissection.  The colon going up to the colostomy was isolated from the fascia and 360 degrees.  Next, patient was placed in Trendelenburg.  Further dissection of small intestine allowed small intestine to sit in the upper abdomen.  This allowed visualization of the rectal stump.  Section as well as Enseal were used to dissect the stump away  from the pelvic wall.  Got down to the true peritoneal reflection.  Total lysis of adhesions time was 60 minutes. There was one serosal tear repaired with 2 3-0 silk Lembert sutures.  Next, cautery was used to dissected the colostomy away from the skin and subcutaneous tissues.  This was continued down to the level of the fascia.  The colon and colostomy were completely separated from the fascia and subcutaneous tissues and 360 degrees.  Next, a pursestring device was used in the last centimeter of the colostomy was resected with cautery.  A 2-0 Prolene was used to create a pursestring in the distal end of the colon.  A 29 French dilator was placed into the distal end of the colon. A 29 Fr EEA anvil was placed into the colon and pursestring tied around it. The colon was placed back into the abdomen. A Wound protector was placed into the colostomy hole.  Puja went under and placed a dilator into the anus and up to the rectum. There was more than 30 cm of colon/rectum in the area and there was some difficulty getting to the staple line. Therefore a 60 mm blue load echelon stapler was used to divide the last 5 cm of the colon/rectum stump. This allowed a dilator to reach the staple end of the stump. An end to end anastomosis was created with 29 mm EEA stapler. Leak test was performed and no bubbles were seen. The 5 cm colon resection was removed via the wound protector site.  Colorectal change over was performed. The fascia of the previous colostomy site was closed with  running 0 PDS. A 3-0 vicryl was used to pursestring to partially close the wound at this site. The laparoscopic sites were closed with 4-0 monocryl interrupted subcuticular stitch. Dermabond was put in place for dressing. The patient awoke from anesthesia and was brought to PACU in stable condition.  Findings: adhesive disease to the anterior abdomen, small bowel adhesed to the left side wall and pelvis.  Specimen: distal sigmoid  colon  Implant: none   Blood loss: 100 ml  Local anesthesia:  50 ml Exparel:Marcaine Mix  Complications: none  Feliciana Rossetti, M.D. General, Bariatric, & Minimally Invasive Surgery [facility]

## 2023-01-08 NOTE — H&P (Signed)
Chief Complaint: New Consultation (Wound recheck: discuss colostomy reversal, s/p partial colectomy )   History of Present Illness: Travis Conrad is a 72 y.o. male who is seen today for colostomy care.  He had dermatitis over his lower midline scar and it became oozy last month but has improved with nystatin powder. He feels a bulge around his colostomy. He is walking around his house often. His wife is concerned about infectious risk after the prolonged hospitalization in 2022 and keeps him near the house most of the time.  Review of Systems: A complete review of systems was obtained from the patient. I have reviewed this information and discussed as appropriate with the patient. See HPI as well for other ROS.  Review of Systems  Constitutional: Negative.  HENT: Negative.  Eyes: Negative.  Respiratory: Negative.  Cardiovascular: Negative.  Gastrointestinal: Negative.  Genitourinary: Negative.  Musculoskeletal: Negative.  Skin: Negative.  Neurological: Negative.  Endo/Heme/Allergies: Negative.  Psychiatric/Behavioral: Negative.    Medical History: Past Medical History:  Diagnosis Date  Anxiety  Arthritis   There is no problem list on file for this patient.  No past surgical history on file.   Allergies  Allergen Reactions  Atorvastatin Itching and Other (See Comments)  myalgias   Current Outpatient Medications on File Prior to Visit  Medication Sig Dispense Refill  AMIOdarone (PACERONE) 200 MG tablet  apixaban (ELIQUIS) 5 mg tablet Take 1 tablet (5 mg total) by mouth 2 (two) times daily  diltiazem (CARDIZEM) 60 MG tablet Take by mouth  docusate (COLACE) 100 MG capsule Take 1 capsule (100 mg total) by mouth 2 (two) times daily  nystatin (MYCOSTATIN) 100,000 unit/gram powder Apply topically 2 (two) times daily 30 g 0   No current facility-administered medications on file prior to visit.   Family History  Problem Relation Age of Onset  Hyperlipidemia (Elevated  cholesterol) Mother    Social History   Tobacco Use  Smoking Status Former  Types: Cigarettes  Quit date: 02/15/2021  Years since quitting: 1.4  Smokeless Tobacco Never    Social History   Socioeconomic History  Marital status: Married  Tobacco Use  Smoking status: Former  Types: Cigarettes  Quit date: 02/15/2021  Years since quitting: 1.4  Smokeless tobacco: Never  Substance and Sexual Activity  Alcohol use: Yes  Drug use: Not Currently   Objective:   Vitals:  08/02/22 1052  BP: 124/62  Pulse: 80  Temp: 36.7 C (98 F)  SpO2: 98%  Weight: 95.3 kg (210 lb)   Body mass index is 28.48 kg/m.  Physical Exam Constitutional:  Appearance: Normal appearance.  HENT:  Head: Normocephalic and atraumatic.  Pulmonary:  Effort: Pulmonary effort is normal.  Abdominal:  Comments: Left sided ostomy pink and patent, small parastomal hernia, lower midline wound with dermatitis, no active drainage, no openings  Musculoskeletal:  General: Normal range of motion.  Cervical back: Normal range of motion.  Neurological:  General: No focal deficit present.  Mental Status: He is alert and oriented to person, place, and time. Mental status is at baseline.  Psychiatric:  Mood and Affect: Mood normal.  Behavior: Behavior normal.  Thought Content: Thought content normal.     Labs, Imaging and Diagnostic Testing:  I reviewed notes by Robin Searing  Assessment and Plan:   Diagnoses and all orders for this visit:  Colostomy care (CMS-HCC)  Diverticulitis of large intestine with perforation without bleeding  Parastomal hernia without obstruction or gangrene  Other orders - polyethylene glycol (MIRALAX)  powder; Take 238 g by mouth once for 1 dose Take according to your procedure prep instructions. - bisacodyL (DULCOLAX) 5 mg EC tablet; Take 4 tablets (20 mg total) by mouth once daily as needed for Constipation for up to 1 dose - metroNIDAZOLE (FLAGYL) 500 MG tablet; Take 2  tablets (1,000 mg total) by mouth 3 (three) times daily for 3 doses Take according to your procedure colon prep instructions - neomycin 500 mg tablet; Take 2 tablets (1,000 mg total) by mouth 3 (three) times daily for 3 doses Take according to your procedure colon prep instructions  He had a prolonged hospital course after sigmoid resection for diverticulitis. He was malnourished but his energy has gotten better this year. He recently saw his cardiologist. He would like to pursue reversal.  The anatomy & physiology of the digestive tract was discussed. The pathophysiology was discussed. Possibility of remaining with an ostomy permanently was discussed. I offered ostomy takedown. Laparoscopic & open techniques were discussed.   Risks such as bleeding, infection, abscess, leak, reoperation, possible re-ostomy, injury to other organs, need for repair of tissues / organs, need for further treatment, hernia, heart attack, death, and other risks were discussed. I noted a good likelihood this will help address the problem. Goals of post-operative recovery were discussed as well. We will work to minimize complications. Questions were answered. The patient expresses understanding & wishes to proceed with surgery.

## 2023-01-08 NOTE — Anesthesia Postprocedure Evaluation (Signed)
Anesthesia Post Note  Patient: Travis Conrad  Procedure(s) Performed: LAPAROSCOPIC COLOSTOMY REVERSAL WITH COLORECTAL ANASTOMOSIS, LYSIS OF ADHESIONS, PARTICIAL COLON RESECTION     Patient location during evaluation: PACU Anesthesia Type: General Level of consciousness: awake and alert Pain management: pain level controlled Vital Signs Assessment: post-procedure vital signs reviewed and stable Respiratory status: spontaneous breathing, nonlabored ventilation, respiratory function stable and patient connected to nasal cannula oxygen Cardiovascular status: blood pressure returned to baseline and stable Postop Assessment: no apparent nausea or vomiting Anesthetic complications: no  No notable events documented.  Last Vitals:  Vitals:   01/08/23 1347 01/08/23 1400  BP:  119/66  Pulse: (!) 58 60  Resp: 11 16  Temp:    SpO2: (!) 89% 99%    Last Pain:  Vitals:   01/08/23 1400  TempSrc:   PainSc: 0-No pain                 Laterrance Nauta S

## 2023-01-08 NOTE — Transfer of Care (Signed)
Immediate Anesthesia Transfer of Care Note  Patient: NASER SCHULD  Procedure(s) Performed: LAPAROSCOPIC COLOSTOMY REVERSAL WITH COLORECTAL ANASTOMOSIS, LYSIS OF ADHESIONS, PARTICIAL COLON RESECTION  Patient Location: PACU  Anesthesia Type:General  Level of Consciousness: awake, oriented, drowsy, and patient cooperative  Airway & Oxygen Therapy: Patient Spontanous Breathing and Patient connected to face mask oxygen  Post-op Assessment: Report given to RN, Post -op Vital signs reviewed and stable, and Patient moving all extremities  Post vital signs: Reviewed and stable  Last Vitals:  Vitals Value Taken Time  BP 120/57 01/08/23 1310  Temp    Pulse 58 01/08/23 1313  Resp 14 01/08/23 1313  SpO2 94 % 01/08/23 1313  Vitals shown include unfiled device data.  Last Pain:  Vitals:   01/08/23 0827  TempSrc:   PainSc: 0-No pain         Complications: No notable events documented.

## 2023-01-08 NOTE — Anesthesia Procedure Notes (Signed)
Procedure Name: Intubation Date/Time: 01/08/2023 10:01 AM  Performed by: Johnette Abraham, CRNAPre-anesthesia Checklist: Patient identified, Emergency Drugs available, Suction available and Patient being monitored Patient Re-evaluated:Patient Re-evaluated prior to induction Oxygen Delivery Method: Circle System Utilized Preoxygenation: Pre-oxygenation with 100% oxygen Induction Type: IV induction Ventilation: Mask ventilation without difficulty Laryngoscope Size: Mac and 4 Grade View: Grade II Tube type: Oral Tube size: 8.0 mm Number of attempts: 1 Airway Equipment and Method: Stylet and Oral airway Placement Confirmation: ETT inserted through vocal cords under direct vision, positive ETCO2 and breath sounds checked- equal and bilateral Secured at: 24 cm Tube secured with: Tape Dental Injury: Teeth and Oropharynx as per pre-operative assessment  Comments: Intubation preformed by EMT student Cloretta Ned). Atraumatic DVL x1

## 2023-01-09 ENCOUNTER — Encounter (HOSPITAL_COMMUNITY): Payer: Self-pay | Admitting: General Surgery

## 2023-01-09 LAB — CBC
HCT: 43.2 % (ref 39.0–52.0)
Hemoglobin: 14.4 g/dL (ref 13.0–17.0)
MCH: 31.1 pg (ref 26.0–34.0)
MCHC: 33.3 g/dL (ref 30.0–36.0)
MCV: 93.3 fL (ref 80.0–100.0)
Platelets: 253 10*3/uL (ref 150–400)
RBC: 4.63 MIL/uL (ref 4.22–5.81)
RDW: 14.1 % (ref 11.5–15.5)
WBC: 21 10*3/uL — ABNORMAL HIGH (ref 4.0–10.5)
nRBC: 0 % (ref 0.0–0.2)

## 2023-01-09 LAB — BASIC METABOLIC PANEL
Anion gap: 9 (ref 5–15)
BUN: 17 mg/dL (ref 8–23)
CO2: 22 mmol/L (ref 22–32)
Calcium: 8.7 mg/dL — ABNORMAL LOW (ref 8.9–10.3)
Chloride: 102 mmol/L (ref 98–111)
Creatinine, Ser: 1.41 mg/dL — ABNORMAL HIGH (ref 0.61–1.24)
GFR, Estimated: 53 mL/min — ABNORMAL LOW (ref >60)
Glucose, Bld: 141 mg/dL — ABNORMAL HIGH (ref 70–99)
Potassium: 4.7 mmol/L (ref 3.5–5.1)
Sodium: 133 mmol/L — ABNORMAL LOW (ref 135–145)

## 2023-01-09 NOTE — Progress Notes (Signed)
   01/09/23 1409  TOC Brief Assessment  Insurance and Status Reviewed  Patient has primary care physician Yes  Home environment has been reviewed home with spouse  Prior level of function: independent  Prior/Current Home Services No current home services  Social Determinants of Health Reivew SDOH reviewed no interventions necessary  Readmission risk has been reviewed Yes  Transition of care needs no transition of care needs at this time

## 2023-01-09 NOTE — Progress Notes (Signed)
1 Day Post-Op  Subjective: Pain moderately controlled, tolerating water, +flatus and small BM overnight, ambulated in the hall  ROS: See above, otherwise other systems negative  Objective: Vital signs in last 24 hours: Temp:  [97.4 F (36.3 C)-98 F (36.7 C)] 98 F (36.7 C) (07/23 0849) Pulse Rate:  [57-65] 64 (07/23 0849) Resp:  [10-19] 17 (07/23 0849) BP: (105-139)/(57-79) 122/67 (07/23 0849) SpO2:  [89 %-100 %] 97 % (07/23 0849) Weight:  [100.7 kg] 100.7 kg (07/23 0500) Last BM Date : 01/07/23  Intake/Output from previous day: 07/22 0701 - 07/23 0700 In: 4029.5 [P.O.:920; I.V.:3009.5; IV Piggyback:100] Out: 1900 [Urine:1800; Blood:100] Intake/Output this shift: Total I/O In: 120 [P.O.:120] Out: 450 [Urine:450]  PE: Gen: NAD R: nonlabored  Lab Results:  Recent Labs    01/08/23 1336 01/09/23 0446  WBC 18.8* 21.0*  HGB 14.3 14.4  HCT 42.7 43.2  PLT 262 253   BMET Recent Labs    01/08/23 1336 01/09/23 0446  NA  --  133*  K  --  4.7  CL  --  102  CO2  --  22  GLUCOSE  --  141*  BUN  --  17  CREATININE 1.58* 1.41*  CALCIUM  --  8.7*   PT/INR No results for input(s): "LABPROT", "INR" in the last 72 hours. CMP     Component Value Date/Time   NA 133 (L) 01/09/2023 0446   NA 140 05/08/2022 1017   NA 142 03/03/2016 0921   K 4.7 01/09/2023 0446   K 4.3 03/03/2016 0921   CL 102 01/09/2023 0446   CO2 22 01/09/2023 0446   CO2 25 03/03/2016 0921   GLUCOSE 141 (H) 01/09/2023 0446   GLUCOSE 89 03/03/2016 0921   BUN 17 01/09/2023 0446   BUN 12 05/08/2022 1017   BUN 13.6 03/03/2016 0921   CREATININE 1.41 (H) 01/09/2023 0446   CREATININE 1.2 03/03/2016 0921   CALCIUM 8.7 (L) 01/09/2023 0446   CALCIUM 9.6 03/03/2016 0921   PROT 7.0 04/12/2022 1111   PROT 7.3 03/03/2016 0921   ALBUMIN 4.3 04/12/2022 1111   ALBUMIN 3.8 03/03/2016 0921   AST 23 04/12/2022 1111   AST 36 (H) 03/03/2016 0921   ALT 20 04/12/2022 1111   ALT 31 03/03/2016 0921   ALKPHOS  129 (H) 04/12/2022 1111   ALKPHOS 116 03/03/2016 0921   BILITOT 0.4 04/12/2022 1111   BILITOT 0.71 03/03/2016 0921   GFRNONAA 53 (L) 01/09/2023 0446   GFRNONAA 67 07/25/2013 1712   GFRAA >60 10/03/2019 0930   GFRAA 77 07/25/2013 1712   Lipase     Component Value Date/Time   LIPASE 22 02/19/2021 1653    Studies/Results: No results found.  Anti-infectives: Anti-infectives (From admission, onward)    Start     Dose/Rate Route Frequency Ordered Stop   01/08/23 1400  neomycin (MYCIFRADIN) tablet 1,000 mg  Status:  Discontinued       Placed in "And" Linked Group   1,000 mg Oral 3 times per day 01/08/23 0822 01/08/23 1431   01/08/23 1400  metroNIDAZOLE (FLAGYL) tablet 1,000 mg  Status:  Discontinued       Placed in "And" Linked Group   1,000 mg Oral 3 times per day 01/08/23 0822 01/08/23 1431   01/08/23 0840  cefoTEtan (CEFOTAN) 2 g in sodium chloride 0.9 % 100 mL IVPB        2 g 200 mL/hr over 30 Minutes Intravenous On call to O.R. 01/08/23 1610  01/08/23 1032       Assessment/Plan POD 1 lap colostomy reversal -dc foley   FEN - full liquids VTE - lovenox ID - periop antibiotics Dispo - inpatient for post op care  I reviewed last 24 h vitals and pain scores, last 48 h intake and output, last 24 h labs and trends, and last 24 h imaging results.   LOS: 1 day   [name] 01/09/2023, 11:21 AM Please see Amion for pager number during day hours 7:00am-4:30pm or 7:00am -11:30am on weekends

## 2023-01-09 NOTE — Progress Notes (Signed)
Mobility Specialist - Progress Note   01/09/23 0932  Mobility  Activity Ambulated independently in hallway  Level of Assistance Independent  Assistive Device None  Distance Ambulated (ft) 500 ft  Activity Response Tolerated well  Mobility Referral Yes  $Mobility charge 1 Mobility  Mobility Specialist Start Time (ACUTE ONLY) [phone]  Mobility Specialist Stop Time (ACUTE ONLY) 0932  Mobility Specialist Time Calculation (min) (ACUTE ONLY) 15 min   Pt received in recliner and agreeable to mobility. During ambulation, pt c/o feeling dizzy. Despite feeling dizzy pt still eager to ambulate. No complaints during session. Pt to bed after session with all needs met & wife in room.  [facility]

## 2023-01-09 NOTE — Plan of Care (Signed)
  Problem: Education: Goal: Understanding of discharge needs will improve Outcome: Progressing Goal: Verbalization of understanding of the causes of altered bowel function will improve Outcome: Progressing   Problem: Activity: Goal: Ability to tolerate increased activity will improve Outcome: Progressing   Problem: Bowel/Gastric: Goal: Gastrointestinal status for postoperative course will improve Outcome: Progressing   Problem: Health Behavior/Discharge Planning: Goal: Identification of community resources to assist with postoperative recovery needs will improve Outcome: Progressing

## 2023-01-10 LAB — CBC
HCT: 40 % (ref 39.0–52.0)
Hemoglobin: 13.3 g/dL (ref 13.0–17.0)
MCH: 31.5 pg (ref 26.0–34.0)
MCHC: 33.3 g/dL (ref 30.0–36.0)
MCV: 94.8 fL (ref 80.0–100.0)
Platelets: 238 10*3/uL (ref 150–400)
RBC: 4.22 MIL/uL (ref 4.22–5.81)
RDW: 14.4 % (ref 11.5–15.5)
WBC: 14.8 10*3/uL — ABNORMAL HIGH (ref 4.0–10.5)
nRBC: 0 % (ref 0.0–0.2)

## 2023-01-10 LAB — BASIC METABOLIC PANEL
Anion gap: 5 (ref 5–15)
BUN: 21 mg/dL (ref 8–23)
CO2: 25 mmol/L (ref 22–32)
Calcium: 8.8 mg/dL — ABNORMAL LOW (ref 8.9–10.3)
Chloride: 105 mmol/L (ref 98–111)
Creatinine, Ser: 1.34 mg/dL — ABNORMAL HIGH (ref 0.61–1.24)
GFR, Estimated: 56 mL/min — ABNORMAL LOW (ref >60)
Glucose, Bld: 114 mg/dL — ABNORMAL HIGH (ref 70–99)
Potassium: 4.3 mmol/L (ref 3.5–5.1)
Sodium: 135 mmol/L (ref 135–145)

## 2023-01-10 LAB — SURGICAL PATHOLOGY

## 2023-01-10 NOTE — Progress Notes (Signed)
**Note Travis-Identified via Obfuscation**   2 Days Post-Op   Chief Complaint/Subjective: Multiple small bloody BMs, +flatus, tolerating liquid diet but not much appetite  Objective: Vital signs in last 24 hours: Temp:  [97.5 F (36.4 C)-98.3 F (36.8 C)] 97.8 F (36.6 C) (07/24 0517) Pulse Rate:  [63-66] 64 (07/24 0517) Resp:  [16-18] 16 (07/24 0517) BP: (115-157)/(65-81) 127/74 (07/24 0517) SpO2:  [97 %] 97 % (07/24 0517) Last BM Date : 01/07/23 Intake/Output from previous day: 07/23 0701 - 07/24 0700 In: 1020 [P.O.:1020] Out: 2900 [Urine:2900]  PE: Gen: NAd Resp: nonlabored Card: RRR Abd: soft, incisions c/d/i  Lab Results:  Recent Labs    01/09/23 0446 01/10/23 0445  WBC 21.0* 14.8*  HGB 14.4 13.3  HCT 43.2 40.0  PLT 253 238   Recent Labs    01/09/23 0446 01/10/23 0445  NA 133* 135  K 4.7 4.3  CL 102 105  CO2 22 25  GLUCOSE 141* 114*  BUN 17 21  CREATININE 1.41* 1.34*  CALCIUM 8.7* 8.8*   No results for input(s): "LABPROT", "INR" in the last 72 hours.    Component Value Date/Time   NA 135 01/10/2023 0445   NA 140 05/08/2022 1017   NA 142 03/03/2016 0921   K 4.3 01/10/2023 0445   K 4.3 03/03/2016 0921   CL 105 01/10/2023 0445   CO2 25 01/10/2023 0445   CO2 25 03/03/2016 0921   GLUCOSE 114 (H) 01/10/2023 0445   GLUCOSE 89 03/03/2016 0921   BUN 21 01/10/2023 0445   BUN 12 05/08/2022 1017   BUN 13.6 03/03/2016 0921   CREATININE 1.34 (H) 01/10/2023 0445   CREATININE 1.2 03/03/2016 0921   CALCIUM 8.8 (L) 01/10/2023 0445   CALCIUM 9.6 03/03/2016 0921   PROT 7.0 04/12/2022 1111   PROT 7.3 03/03/2016 0921   ALBUMIN 4.3 04/12/2022 1111   ALBUMIN 3.8 03/03/2016 0921   AST 23 04/12/2022 1111   AST 36 (H) 03/03/2016 0921   ALT 20 04/12/2022 1111   ALT 31 03/03/2016 0921   ALKPHOS 129 (H) 04/12/2022 1111   ALKPHOS 116 03/03/2016 0921   BILITOT 0.4 04/12/2022 1111   BILITOT 0.71 03/03/2016 0921   GFRNONAA 56 (L) 01/10/2023 0445   GFRNONAA 67 07/25/2013 1712   GFRAA >60 10/03/2019 0930    GFRAA 77 07/25/2013 1712    Assessment/Plan  s/p Procedure(s): LAPAROSCOPIC COLOSTOMY REVERSAL WITH COLORECTAL ANASTOMOSIS, LYSIS OF ADHESIONS, PARTICIAL COLON RESECTION 01/08/2023    FEN - adv HH diet VTE - lovenox ID - periop abx Disposition - inpatient, watch for continued bloody output after surgery   LOS: 2 days   I reviewed last 24 h vitals and pain scores, last 48 h intake and output, last 24 h labs and trends, and last 24 h imaging results.  This care required moderate level of medical decision making.   [facility] 01/10/2023, 8:33 AM Please see Amion for pager number during day hours 7:00am-4:30pm or 7:00am -11:30am on weekends

## 2023-01-10 NOTE — Progress Notes (Signed)
Mobility Specialist - Progress Note   01/10/23 0901  Mobility  Activity Ambulated independently in hallway  Level of Assistance Independent  Assistive Device None  Distance Ambulated (ft) 500 ft  Activity Response Tolerated well  Mobility Referral Yes  $Mobility charge 1 Mobility  Mobility Specialist Start Time (ACUTE ONLY) 0835  Mobility Specialist Stop Time (ACUTE ONLY) 0844  Mobility Specialist Time Calculation (min) (ACUTE ONLY) 9 min   Pt received in bed and agreeable to mobility. Pt had x2 instances of LOB that were self corrected. No complaints during session. Pt to bed after session with all needs met.    [facility]

## 2023-01-10 NOTE — Plan of Care (Signed)

## 2023-01-11 LAB — BASIC METABOLIC PANEL
Anion gap: 8 (ref 5–15)
BUN: 24 mg/dL — ABNORMAL HIGH (ref 8–23)
CO2: 27 mmol/L (ref 22–32)
Chloride: 103 mmol/L (ref 98–111)
Creatinine, Ser: 1.34 mg/dL — ABNORMAL HIGH (ref 0.61–1.24)
GFR, Estimated: 56 mL/min — ABNORMAL LOW (ref >60)
Glucose, Bld: 101 mg/dL — ABNORMAL HIGH (ref 70–99)
Potassium: 4 mmol/L (ref 3.5–5.1)

## 2023-01-11 LAB — CBC
HCT: 39.1 % (ref 39.0–52.0)
Hemoglobin: 12.6 g/dL — ABNORMAL LOW (ref 13.0–17.0)
MCH: 30.6 pg (ref 26.0–34.0)
MCHC: 32.2 g/dL (ref 30.0–36.0)
MCV: 94.9 fL (ref 80.0–100.0)
Platelets: 227 10*3/uL (ref 150–400)
RBC: 4.12 MIL/uL — ABNORMAL LOW (ref 4.22–5.81)
nRBC: 0 % (ref 0.0–0.2)

## 2023-01-11 MED ORDER — ACETAMINOPHEN 325 MG PO TABS
650.0000 mg | ORAL_TABLET | Freq: Four times a day (QID) | ORAL | Status: DC | PRN
  Administered 2023-01-11 – 2023-01-13 (×3): 650 mg via ORAL
  Filled 2023-01-11 (×3): qty 2

## 2023-01-11 MED ORDER — HYDROMORPHONE HCL 1 MG/ML IJ SOLN
1.0000 mg | Freq: Once | INTRAMUSCULAR | Status: AC
  Administered 2023-01-11: 1 mg via INTRAVENOUS
  Filled 2023-01-11: qty 1

## 2023-01-11 NOTE — Progress Notes (Signed)
3 Days Post-Op  Subjective: Pain controlled this morning, tolerated food yesterday, continues to have loose BMs  ROS: See above, otherwise other systems negative  Objective: Vital signs in last 24 hours: Temp:  [97.4 F (36.3 C)-97.9 F (36.6 C)] 97.7 F (36.5 C) (07/25 1332) Pulse Rate:  [58-66] 59 (07/25 1332) Resp:  [16-18] 18 (07/25 1332) BP: (112-139)/(61-74) 114/61 (07/25 1332) SpO2:  [96 %-98 %] 97 % (07/25 1332) Weight:  [102.9 kg] 102.9 kg (07/25 0500) Last BM Date : 01/11/23  Intake/Output from previous day: 07/24 0701 - 07/25 0700 In: 1200 [P.O.:1200] Out: 2150 [Urine:2150] Intake/Output this shift: Total I/O In: 240 [P.O.:240] Out: 300 [Urine:300]  PE: Gen: NAD R: nonlabored CV: RRR Ab: soft, NT, ND, left wound packed  Lab Results:  Recent Labs    01/10/23 0445 01/11/23 0425  WBC 14.8* 13.2*  HGB 13.3 12.6*  HCT 40.0 39.1  PLT 238 227   BMET Recent Labs    01/10/23 0445 01/11/23 0425  NA 135 138  K 4.3 4.0  CL 105 103  CO2 25 27  GLUCOSE 114* 101*  BUN 21 24*  CREATININE 1.34* 1.34*  CALCIUM 8.8* 8.7*   PT/INR No results for input(s): "LABPROT", "INR" in the last 72 hours. CMP     Component Value Date/Time   NA 138 01/11/2023 0425   NA 140 05/08/2022 1017   NA 142 03/03/2016 0921   K 4.0 01/11/2023 0425   K 4.3 03/03/2016 0921   CL 103 01/11/2023 0425   CO2 27 01/11/2023 0425   CO2 25 03/03/2016 0921   GLUCOSE 101 (H) 01/11/2023 0425   GLUCOSE 89 03/03/2016 0921   BUN 24 (H) 01/11/2023 0425   BUN 12 05/08/2022 1017   BUN 13.6 03/03/2016 0921   CREATININE 1.34 (H) 01/11/2023 0425   CREATININE 1.2 03/03/2016 0921   CALCIUM 8.7 (L) 01/11/2023 0425   CALCIUM 9.6 03/03/2016 0921   PROT 7.0 04/12/2022 1111   PROT 7.3 03/03/2016 0921   ALBUMIN 4.3 04/12/2022 1111   ALBUMIN 3.8 03/03/2016 0921   AST 23 04/12/2022 1111   AST 36 (H) 03/03/2016 0921   ALT 20 04/12/2022 1111   ALT 31 03/03/2016 0921   ALKPHOS 129 (H)  04/12/2022 1111   ALKPHOS 116 03/03/2016 0921   BILITOT 0.4 04/12/2022 1111   BILITOT 0.71 03/03/2016 0921   GFRNONAA 56 (L) 01/11/2023 0425   GFRNONAA 67 07/25/2013 1712   GFRAA >60 10/03/2019 0930   GFRAA 77 07/25/2013 1712   Lipase     Component Value Date/Time   LIPASE 22 02/19/2021 1653    Studies/Results: No results found.  Anti-infectives: Anti-infectives (From admission, onward)    Start     Dose/Rate Route Frequency Ordered Stop   01/08/23 1400  neomycin (MYCIFRADIN) tablet 1,000 mg  Status:  Discontinued       Placed in "And" Linked Group   1,000 mg Oral 3 times per day 01/08/23 0822 01/08/23 1431   01/08/23 1400  metroNIDAZOLE (FLAGYL) tablet 1,000 mg  Status:  Discontinued       Placed in "And" Linked Group   1,000 mg Oral 3 times per day 01/08/23 0822 01/08/23 1431   01/08/23 0840  cefoTEtan (CEFOTAN) 2 g in sodium chloride 0.9 % 100 mL IVPB        2 g 200 mL/hr over 30 Minutes Intravenous On call to O.R. 01/08/23 0822 01/08/23 1032       Assessment/Plan POD 3  lap colostomy reversal   FEN - heart healthy diet VTE - lovenox ID - no issues Dispo - continue to ambulate  I reviewed last 24 h vitals and pain scores, last 48 h intake and output, last 24 h labs and trends, and last 24 h imaging results.  This care required moderate level of medical decision making.    LOS: 3 days   [name] [facility] Surgery 01/11/2023, 1:35 PM Please see Amion for pager number during day hours 7:00am-4:30pm or 7:00am -11:30am on weekends

## 2023-01-11 NOTE — Plan of Care (Signed)
  Problem: Education: Goal: Verbalization of understanding of the causes of altered bowel function will improve Outcome: Progressing   Problem: Activity: Goal: Ability to tolerate increased activity will improve Outcome: Progressing   Problem: Bowel/Gastric: Goal: Gastrointestinal status for postoperative course will improve Outcome: Progressing   Problem: Clinical Measurements: Goal: Postoperative complications will be avoided or minimized Outcome: Progressing   Problem: Skin Integrity: Goal: Will show signs of wound healing Outcome: Progressing

## 2023-01-12 MED ORDER — HYDROMORPHONE HCL 1 MG/ML IJ SOLN
0.5000 mg | INTRAMUSCULAR | Status: DC | PRN
  Administered 2023-01-12: 1 mg via INTRAVENOUS
  Administered 2023-01-12: 0.5 mg via INTRAVENOUS
  Administered 2023-01-13: 1 mg via INTRAVENOUS
  Filled 2023-01-12 (×3): qty 1

## 2023-01-12 MED ORDER — GABAPENTIN 100 MG PO CAPS
200.0000 mg | ORAL_CAPSULE | Freq: Three times a day (TID) | ORAL | Status: DC
  Administered 2023-01-12 – 2023-01-13 (×4): 200 mg via ORAL
  Filled 2023-01-12 (×4): qty 2

## 2023-01-12 MED ORDER — METHOCARBAMOL 500 MG PO TABS
500.0000 mg | ORAL_TABLET | Freq: Three times a day (TID) | ORAL | Status: DC | PRN
  Administered 2023-01-12 – 2023-01-13 (×4): 500 mg via ORAL
  Filled 2023-01-12 (×4): qty 1

## 2023-01-12 NOTE — Progress Notes (Signed)
4 Days Post-Op  Subjective: Pain issues yesterday over left incision. Responded to pain medications but does recur. Worse with movement. Eating well, BMs loose  ROS: See above, otherwise other systems negative  Objective: Vital signs in last 24 hours: Temp:  [97.7 F (36.5 C)-98.4 F (36.9 C)] 98.2 F (36.8 C) (07/26 0500) Pulse Rate:  [59-64] 62 (07/26 0500) Resp:  [18] 18 (07/26 0500) BP: (114-121)/(61-66) 121/66 (07/26 0500) SpO2:  [95 %-97 %] 95 % (07/26 0500) Weight:  [99.9 kg] 99.9 kg (07/26 0500) Last BM Date : 01/11/23  Intake/Output from previous day: 07/25 0701 - 07/26 0700 In: 720 [P.O.:720] Out: 1500 [Urine:1500] Intake/Output this shift: No intake/output data recorded.  PE: Gen: NAD Resp: nonlabored CV: RRR Abd: soft, incision intact, no erythema  Lab Results:  Recent Labs    01/11/23 0425 01/12/23 0428  WBC 13.2* 11.2*  HGB 12.6* 12.9*  HCT 39.1 39.3  PLT 227 237   BMET Recent Labs    01/11/23 0425 01/12/23 0428  NA 138 136  K 4.0 3.7  CL 103 101  CO2 27 25  GLUCOSE 101* 113*  BUN 24* 24*  CREATININE 1.34* 1.33*  CALCIUM 8.7* 8.5*   PT/INR No results for input(s): "LABPROT", "INR" in the last 72 hours. CMP     Component Value Date/Time   NA 136 01/12/2023 0428   NA 140 05/08/2022 1017   NA 142 03/03/2016 0921   K 3.7 01/12/2023 0428   K 4.3 03/03/2016 0921   CL 101 01/12/2023 0428   CO2 25 01/12/2023 0428   CO2 25 03/03/2016 0921   GLUCOSE 113 (H) 01/12/2023 0428   GLUCOSE 89 03/03/2016 0921   BUN 24 (H) 01/12/2023 0428   BUN 12 05/08/2022 1017   BUN 13.6 03/03/2016 0921   CREATININE 1.33 (H) 01/12/2023 0428   CREATININE 1.2 03/03/2016 0921   CALCIUM 8.5 (L) 01/12/2023 0428   CALCIUM 9.6 03/03/2016 0921   PROT 7.0 04/12/2022 1111   PROT 7.3 03/03/2016 0921   ALBUMIN 4.3 04/12/2022 1111   ALBUMIN 3.8 03/03/2016 0921   AST 23 04/12/2022 1111   AST 36 (H) 03/03/2016 0921   ALT 20 04/12/2022 1111   ALT 31 03/03/2016  0921   ALKPHOS 129 (H) 04/12/2022 1111   ALKPHOS 116 03/03/2016 0921   BILITOT 0.4 04/12/2022 1111   BILITOT 0.71 03/03/2016 0921   GFRNONAA 57 (L) 01/12/2023 0428   GFRNONAA 67 07/25/2013 1712   GFRAA >60 10/03/2019 0930   GFRAA 77 07/25/2013 1712   Lipase     Component Value Date/Time   LIPASE 22 02/19/2021 1653    Studies/Results: No results found.  Anti-infectives: Anti-infectives (From admission, onward)    Start     Dose/Rate Route Frequency Ordered Stop   01/08/23 1400  neomycin (MYCIFRADIN) tablet 1,000 mg  Status:  Discontinued       Placed in "And" Linked Group   1,000 mg Oral 3 times per day 01/08/23 0822 01/08/23 1431   01/08/23 1400  metroNIDAZOLE (FLAGYL) tablet 1,000 mg  Status:  Discontinued       Placed in "And" Linked Group   1,000 mg Oral 3 times per day 01/08/23 0822 01/08/23 1431   01/08/23 0840  cefoTEtan (CEFOTAN) 2 g in sodium chloride 0.9 % 100 mL IVPB        2 g 200 mL/hr over 30 Minutes Intravenous On call to O.R. 01/08/23 1308 01/08/23 1032  Assessment/Plan POD 4 lap colostomy reversal   FEN - heart healthy diet VTE - lovenox ID - no issues Dispo - pain issues, add gabapentin and robaxin, continue tylenol, oxycodone, dilaudid for breakthrough  I reviewed last 24 h vitals and pain scores, last 48 h intake and output, last 24 h labs and trends, and last 24 h imaging results.  This care required moderate level of medical decision making.    LOS: 4 days   [name] [facility] Surgery 01/12/2023, 8:12 AM Please see Amion for pager number during day hours 7:00am-4:30pm or 7:00am -11:30am on weekends

## 2023-01-12 NOTE — Plan of Care (Signed)
  Problem: Education: Goal: Understanding of discharge needs will improve Outcome: Progressing   Problem: Activity: Goal: Ability to tolerate increased activity will improve Outcome: Progressing   Problem: Health Behavior/Discharge Planning: Goal: Identification of community resources to assist with postoperative recovery needs will improve Outcome: Progressing

## 2023-01-13 MED ORDER — OXYCODONE HCL 5 MG PO TABS: 5.0000 mg | ORAL_TABLET | ORAL | Status: DC | PRN

## 2023-01-13 MED ORDER — OXYCODONE HCL 5 MG PO TABS: 5.0000 mg | ORAL_TABLET | ORAL | 0 refills | Status: DC | PRN

## 2023-01-13 MED ORDER — GABAPENTIN 100 MG PO CAPS: 200.0000 mg | ORAL_CAPSULE | Freq: Three times a day (TID) | ORAL | 0 refills | Status: DC

## 2023-01-13 MED ORDER — METHOCARBAMOL 500 MG PO TABS: 500.0000 mg | ORAL_TABLET | Freq: Three times a day (TID) | ORAL | 0 refills | Status: DC | PRN

## 2023-01-13 MED ORDER — ACETAMINOPHEN 325 MG PO TABS: 650.0000 mg | ORAL_TABLET | Freq: Four times a day (QID) | ORAL | 0 refills | Status: AC | PRN

## 2023-01-13 NOTE — Plan of Care (Signed)
Problem: Education: Goal: Understanding of discharge needs will improve Outcome: Adequate for Discharge Goal: Verbalization of understanding of the causes of altered bowel function will improve Outcome: Adequate for Discharge   Problem: Activity: Goal: Ability to tolerate increased activity will improve Outcome: Adequate for Discharge   Problem: Bowel/Gastric: Goal: Gastrointestinal status for postoperative course will improve Outcome: Adequate for Discharge   Problem: Health Behavior/Discharge Planning: Goal: Identification of community resources to assist with postoperative recovery needs will improve Outcome: Adequate for Discharge   Problem: Nutritional: Goal: Will attain and maintain optimal nutritional status will improve Outcome: Adequate for Discharge   Problem: Clinical Measurements: Goal: Postoperative complications will be avoided or minimized Outcome: Adequate for Discharge   Problem: Respiratory: Goal: Respiratory status will improve Outcome: Adequate for Discharge   Problem: Skin Integrity: Goal: Will show signs of wound healing Outcome: Adequate for Discharge   Problem: Education: Goal: Knowledge of General Education information will improve Description: Including pain rating scale, medication(s)/side effects and non-pharmacologic comfort measures Outcome: Adequate for Discharge   Problem: Health Behavior/Discharge Planning: Goal: Ability to manage health-related needs will improve Outcome: Adequate for Discharge   Problem: Clinical Measurements: Goal: Ability to maintain clinical measurements within normal limits will improve Outcome: Adequate for Discharge Goal: Will remain free from infection Outcome: Adequate for Discharge Goal: Diagnostic test results will improve Outcome: Adequate for Discharge Goal: Respiratory complications will improve Outcome: Adequate for Discharge Goal: Cardiovascular complication will be avoided Outcome: Adequate for  Discharge   Problem: Activity: Goal: Risk for activity intolerance will decrease Outcome: Adequate for Discharge   Problem: Nutrition: Goal: Adequate nutrition will be maintained Outcome: Adequate for Discharge   Problem: Coping: Goal: Level of anxiety will decrease Outcome: Adequate for Discharge   Problem: Elimination: Goal: Will not experience complications related to bowel motility Outcome: Adequate for Discharge Goal: Will not experience complications related to urinary retention Outcome: Adequate for Discharge   Problem: Pain Managment: Goal: General experience of comfort will improve Outcome: Adequate for Discharge   Problem: Safety: Goal: Ability to remain free from injury will improve Outcome: Adequate for Discharge   Problem: Skin Integrity: Goal: Risk for impaired skin integrity will decrease Outcome: Adequate for Discharge   Problem: Education: Goal: Knowledge of General Education information will improve Description: Including pain rating scale, medication(s)/side effects and non-pharmacologic comfort measures Outcome: Adequate for Discharge   Problem: Health Behavior/Discharge Planning: Goal: Ability to manage health-related needs will improve Outcome: Adequate for Discharge   Problem: Clinical Measurements: Goal: Ability to maintain clinical measurements within normal limits will improve Outcome: Adequate for Discharge Goal: Will remain free from infection Outcome: Adequate for Discharge Goal: Diagnostic test results will improve Outcome: Adequate for Discharge Goal: Respiratory complications will improve Outcome: Adequate for Discharge Goal: Cardiovascular complication will be avoided Outcome: Adequate for Discharge   Problem: Activity: Goal: Risk for activity intolerance will decrease Outcome: Adequate for Discharge   Problem: Nutrition: Goal: Adequate nutrition will be maintained Outcome: Adequate for Discharge   Problem: Coping: Goal:  Level of anxiety will decrease Outcome: Adequate for Discharge   Problem: Elimination: Goal: Will not experience complications related to bowel motility Outcome: Adequate for Discharge Goal: Will not experience complications related to urinary retention Outcome: Adequate for Discharge   Problem: Pain Managment: Goal: General experience of comfort will improve Outcome: Adequate for Discharge   Problem: Safety: Goal: Ability to remain free from injury will improve Outcome: Adequate for Discharge   Problem: Skin Integrity: Goal: Risk for impaired skin integrity will  decrease Outcome: Adequate for Discharge

## 2023-01-13 NOTE — Progress Notes (Signed)
D/C instructions reviewed w/ patient and wife.Teach back method used. Pt & wife verbalized an understanding. No other questions at this time. PIV's removed as documented. Pt to car via w/c. PT home w/ wife via POV

## 2023-01-13 NOTE — Discharge Summary (Signed)
Physician Discharge Summary  Travis Conrad AVW:[id number] DOB: 1950/08/08 DOA: 01/08/2023  PCP: [name], MD  Admit date: 01/08/2023 Discharge date:  01/13/2023   Recommendations for Outpatient Follow-up:   (include homehealth, outpatient follow-up instructions, specific recommendations for PCP to follow-up on, etc.)   Follow-up Information     [name], [name], MD. Schedule an appointment as soon as possible for a visit in 1 month(s).   Specialty: General Surgery Contact information: [address] [place] [address] [phone]                Discharge Diagnoses:  Principal Problem:   Colostomy care Travis Conrad)   Surgical Procedure: lap colostomy reversal  Discharge Condition: Good Disposition: Home  Diet recommendation: heart healthy diet   Hospital Course:  72 yo male underwent colostomy reversal. He was admitted post op. He had return of bowel function POD 1. He tolerated a reg diet POD 2. He had bad pain issues POD 3-4 which was controlled with multimodal therapy and discharged home POD 5.  Discharge Instructions  Discharge Instructions     Call MD for:  difficulty breathing, headache or visual disturbances   Complete by: As directed    Call MD for:  persistant nausea and vomiting   Complete by: As directed    Call MD for:  redness, tenderness, or signs of infection (pain, swelling, redness, odor or green/yellow discharge around incision site)   Complete by: As directed    Call MD for:  severe uncontrolled pain   Complete by: As directed    Call MD for:  temperature >100.4   Complete by: As directed    Diet - low sodium heart healthy   Complete by: As directed    Discharge wound care:   Complete by: As directed    Ok to shower, place dry gauze over top left open wound daily   Increase activity slowly   Complete by: As directed       Allergies as of 01/13/2023       Reactions   Statins    myalgias   Tape     Skin tears         Medication List     TAKE these medications    acetaminophen 325 MG tablet Commonly known as: TYLENOL Take 2 tablets (650 mg total) by mouth every 6 (six) hours as needed for mild pain.   amiodarone 200 MG tablet Commonly known as: PACERONE TAKE 1 TABLET(200 MG) BY MOUTH DAILY   aspirin EC 325 MG tablet Take 975 mg by mouth every 6 (six) hours as needed for moderate pain.   diclofenac Sodium 1 % Gel Commonly known as: VOLTAREN Apply 1 Application topically 4 (four) times daily as needed (pain).   diltiazem 120 MG 24 hr capsule Commonly known as: CARDIZEM CD TAKE 1 CAPSULE(120 MG) BY MOUTH DAILY   doxylamine (Sleep) 25 MG tablet Commonly known as: UNISOM Take 25 mg by mouth at bedtime. (1st)   Eliquis 5 MG Tabs tablet Generic drug: apixaban TAKE 1 TABLET(5 MG) BY MOUTH TWICE DAILY   ezetimibe 10 MG tablet Commonly known as: ZETIA Take 10 mg by mouth daily.   fluticasone 50 MCG/ACT nasal spray Commonly known as: FLONASE 1 spray See admin instructions. 1 spray around stoem of colostomy bag as needed for a rash   gabapentin 100 MG capsule Commonly known as: NEURONTIN Take 2 capsules (200 mg total) by mouth 3 (three) times daily.  Melatonin 10 MG Tbcr Take 10 mg by mouth at bedtime as needed (sleep).   methocarbamol 500 MG tablet Commonly known as: ROBAXIN Take 1 tablet (500 mg total) by mouth every 8 (eight) hours as needed for muscle spasms.   nitroGLYCERIN 0.4 MG SL tablet Commonly known as: NITROSTAT Place 1 tablet (0.4 mg total) under the tongue every 5 (five) minutes as needed for chest pain.   OVER THE COUNTER MEDICATION Take 2 tablets by mouth at bedtime. Vicks Triple Action ZZZ otc sleep aid (3rd)   OVER THE COUNTER MEDICATION Take 2 capsules by mouth at bedtime. ZZZquil pure Zs sleep aid (2nd)   OVER THE COUNTER MEDICATION Take 1 tablet by mouth at bedtime as needed (sleep). 3 AM otc melatonin sleep aid   OVER THE COUNTER  MEDICATION Take 1 tablet by mouth at bedtime as needed (sleep). Nature Made Back to sleep otc sleep aid   oxyCODONE 5 MG immediate release tablet Commonly known as: Oxy IR/ROXICODONE Take 1 tablet (5 mg total) by mouth every 4 (four) hours as needed for moderate pain.   REFRESH OP Place 1 drop into both eyes daily as needed (dry eye).   tamsulosin 0.4 MG Caps capsule Commonly known as: FLOMAX Take 0.4 mg by mouth daily.   triamcinolone cream 0.1 % Commonly known as: KENALOG Apply 1 Application topically 3 (three) times daily as needed (rash).   zolpidem 10 MG tablet Commonly known as: AMBIEN Take 10 mg by mouth at bedtime.               Discharge Care Instructions  (From admission, onward)           Start     Ordered   01/13/23 0000  Discharge wound care:       Comments: Ok to shower, place dry gauze over top left open wound daily   01/13/23 0827            Follow-up Information     Jasun Gasparini, [name], MD. Schedule an appointment as soon as possible for a visit in 1 month(s).   Specialty: General Surgery Contact information: [address] [place] [address] [phone]                  The results of significant diagnostics from this hospitalization (including imaging, microbiology, ancillary and laboratory) are listed below for reference.    Significant Diagnostic Studies: No results found.  Labs: Basic Metabolic Panel: Recent Labs  Lab 01/08/23 1336 01/09/23 0446 01/10/23 0445 01/11/23 0425 01/12/23 0428  NA  --  133* 135 138 136  K  --  4.7 4.3 4.0 3.7  CL  --  102 105 103 101  CO2  --  22 25 27 25   GLUCOSE  --  141* 114* 101* 113*  BUN  --  17 21 24* 24*  CREATININE 1.58* 1.41* 1.34* 1.34* 1.33*  CALCIUM  --  8.7* 8.8* 8.7* 8.5*   Liver Function Tests: No results for input(s): "AST", "ALT", "ALKPHOS", "BILITOT", "PROT", "ALBUMIN" in the last 168 hours.  CBC: Recent Labs  Lab 01/08/23 1336  01/09/23 0446 01/10/23 0445 01/11/23 0425 01/12/23 0428  WBC 18.8* 21.0* 14.8* 13.2* 11.2*  HGB 14.3 14.4 13.3 12.6* 12.9*  HCT 42.7 43.2 40.0 39.1 39.3  MCV 93.8 93.3 94.8 94.9 94.7  PLT 262 253 238 227 237    CBG: No results for input(s): "GLUCAP" in the last 168 hours.  Principal Problem:  Colostomy care [facility])   Time coordinating discharge: 15 min

## 2023-01-26 LAB — LAB REPORT - SCANNED: EGFR: 48

## 2023-03-24 NOTE — Progress Notes (Unsigned)
Cardiology Office Note:  .   Date:  03/24/2023  ID:  Travis Conrad, DOB [birth year], MRN [id number] PCP: [name], MD  [facility] Providers Cardiologist:  Dietrich Pates, MD Electrophysiologist:  Will Jorja Loa, MD {  History of Present Illness: .   Travis Conrad is a 72 y.o. male w/PMHx of HTN, AFib  complicated admission 02/19/2021 through 04/06/2021. He was found to have a pelvic abscess and eventually underwent sigmoid colectomy and diverting colostomy. He developed postop ileus. He had knee pain and was found to have an effusion. He was [facility] to [facility] and discharged from [facility] 04/21/2021.   He saw Dr. Elberta Fortis 09/22/21, no symptoms of AFib, continued to f/u with GI/docs for timing of ostomy reversal.  He was a bit bloated, had some weight gain, SOB, planned for BNP  Saw E. DIck, NP 03/2023, ding well, no changes were made.  Underwent ostomy reversal discharged 01/13/23, on POD #5  Discussed perhaps looking towards d/c amiodarone once he is past his abdominal issues, surgeries.  Today's visit is scheduled as a 6 mo visit  ROS: ***  *** AF burden *** amio labs *** eliquis, bleeding, dose, labs *** symptoms   Arrhythmia/AAD hx PVI ablation 10/03/19 Sotalol stopped during hospitalization with diverticular perf, hypotension w/rapid AF >> amio Amiodarone started 2022    Studies Reviewed: [name]    EKG done today and reviewed by myself:  ***   02/23/21 TTE 1. Left ventricular ejection fraction, by estimation, is 60 to 65%. The  left ventricle has normal function. The left ventricle has no regional  wall motion abnormalities. There is moderate left ventricular hypertrophy  of the basal-septal segment. Left  ventricular diastolic parameters were normal.   2. Right ventricular systolic function is normal. The right ventricular  size is normal. Tricuspid regurgitation signal is inadequate for assessing  PA pressure.   3. The mitral valve is normal in  structure. Trivial mitral valve  regurgitation. No evidence of mitral stenosis.   4. The aortic valve is normal in structure. Aortic valve regurgitation is  not visualized. No aortic stenosis is present.    10/03/19 EPS/ablation CONCLUSIONS: 1. Sinus rhythm upon presentation.   2. Successful electrical isolation and anatomical encircling of all four pulmonary veins with radiofrequency current. 3. No inducible arrhythmias following ablation both on and off of Isuprel 4. No early apparent complications.   Risk Assessment/Calculations:    Physical Exam:   VS:  There were no vitals taken for this visit.   Wt Readings from Last 3 Encounters:  01/13/23 220 lb 0.3 oz (99.8 kg)  01/01/23 212 lb (96.2 kg)  04/12/22 206 lb 12.8 oz (93.8 kg)    GEN: Well nourished, well developed in no acute distress NECK: No JVD; No carotid bruits CARDIAC: ***RRR, no murmurs, rubs, gallops RESPIRATORY:  *** CTA b/l without rales, wheezing or rhonchi  ABDOMEN: Soft, non-tender, non-distended EXTREMITIES:  No edema; No deformity     ASSESSMENT AND PLAN: .    Paroxysmal AFib CHA2DS2Vasc is 2, on Eliquis, *** appropriately dosed Chronic amiodarone *** burden by symptoms ***   HTN ***  Secondary hypercoagulable state 2/2 AFib     {Are you ordering a CV Procedure (e.g. stress test, cath, DCCV, TEE, etc)?   Press F2        :[id number]}     Dispo: ***  Signed, Sheilah Pigeon, PA-C

## 2023-03-26 ENCOUNTER — Ambulatory Visit: Payer: Medicare Other | Attending: Physician Assistant | Admitting: Physician Assistant

## 2023-03-26 ENCOUNTER — Encounter: Payer: Self-pay | Admitting: Physician Assistant

## 2023-03-26 VITALS — BP 130/62 | HR 67 | Ht 72.0 in | Wt 214.4 lb

## 2023-03-26 DIAGNOSIS — Z79899 Other long term (current) drug therapy: Secondary | ICD-10-CM

## 2023-03-26 DIAGNOSIS — I10 Essential (primary) hypertension: Secondary | ICD-10-CM

## 2023-03-26 DIAGNOSIS — I480 Paroxysmal atrial fibrillation: Secondary | ICD-10-CM | POA: Diagnosis not present

## 2023-03-26 DIAGNOSIS — D6869 Other thrombophilia: Secondary | ICD-10-CM | POA: Diagnosis not present

## 2023-03-26 MED ORDER — AMIODARONE HCL 200 MG PO TABS: 100.0000 mg | ORAL_TABLET | Freq: Every day | ORAL | 3 refills | Status: DC

## 2023-03-26 NOTE — Patient Instructions (Signed)
Medication Instructions:   START TAKING :  AMIODARONE 100 MG ONCE A DAY   *If you need a refill on your cardiac medications before your next appointment, please call your pharmacy*   Lab Work:  LFT AND TSH TODAY  If you have labs (blood work) drawn today and your tests are completely normal, you will receive your results only by: MyChart Message (if you have MyChart) OR A paper copy in the mail If you have any lab test that is abnormal or we need to change your treatment, we will call you to review the results.   Testing/Procedures: NONE ORDERED  TODAY     Follow-Up: At [facility], you and your health needs are our priority.  As part of our continuing mission to provide you with exceptional heart care, we have created designated Provider Care Teams.  These Care Teams include your primary Cardiologist (physician) and Advanced Practice Providers (APPs -  Physician Assistants and Nurse Practitioners) who all work together to provide you with the care you need, when you need it.  We recommend signing up for the patient portal called "MyChart".  Sign up information is provided on this After Visit Summary.  MyChart is used to connect with patients for Virtual Visits (Telemedicine).  Patients are able to view lab/test results, encounter notes, upcoming appointments, etc.  Non-urgent messages can be sent to your provider as well.   To learn more about what you can do with MyChart, go to [email or web address].    Your next appointment:   2-3 month(s)  Provider:   You may see Will Jorja Loa, MD or one of the following Advanced Practice Providers on your designated Care Team:   Francis Dowse, [place] Casimiro Needle "Mardelle Matte" Lanna Poche, [place]  Other Instructions

## 2023-03-27 LAB — HEPATIC FUNCTION PANEL
ALT: 19 [IU]/L (ref 0–44)
AST: 20 [IU]/L (ref 0–40)
Albumin: 4 g/dL (ref 3.8–4.8)
Alkaline Phosphatase: 82 [IU]/L (ref 44–121)
Bilirubin Total: 0.4 mg/dL (ref 0.0–1.2)
Bilirubin, Direct: 0.14 mg/dL (ref 0.00–0.40)
Total Protein: 6.4 g/dL (ref 6.0–8.5)

## 2023-03-27 LAB — TSH: TSH: 0.653 u[IU]/mL (ref 0.450–4.500)

## 2023-04-09 ENCOUNTER — Ambulatory Visit: Payer: Medicare Other | Attending: Nurse Practitioner | Admitting: Physician Assistant

## 2023-04-09 ENCOUNTER — Encounter: Payer: Self-pay | Admitting: Physician Assistant

## 2023-04-09 VITALS — BP 130/60 | HR 60 | Ht 72.0 in | Wt 211.8 lb

## 2023-04-09 DIAGNOSIS — I480 Paroxysmal atrial fibrillation: Secondary | ICD-10-CM

## 2023-04-09 DIAGNOSIS — I2510 Atherosclerotic heart disease of native coronary artery without angina pectoris: Secondary | ICD-10-CM

## 2023-04-09 DIAGNOSIS — I10 Essential (primary) hypertension: Secondary | ICD-10-CM

## 2023-04-09 DIAGNOSIS — E7800 Pure hypercholesterolemia, unspecified: Secondary | ICD-10-CM | POA: Diagnosis not present

## 2023-04-09 DIAGNOSIS — N1831 Chronic kidney disease, stage 3a: Secondary | ICD-10-CM | POA: Insufficient documentation

## 2023-04-09 NOTE — Assessment & Plan Note (Signed)
Blood pressure controlled on Cardizem CD 120mg  daily. -Continue Cardizem CD 120mg  daily.

## 2023-04-09 NOTE — Patient Instructions (Addendum)
Medication Instructions:  Your physician recommends that you continue on your current medications as directed. Please refer to the Current Medication list given to you today.  *If you need a refill on your cardiac medications before your next appointment, please call your pharmacy*   Lab Work: None ordered today but when your PCP does your fasting labs, please ask them to fax [place] a copy to [phone].   If you have labs (blood work) drawn today and your tests are completely normal, you will receive your results only by: MyChart Message (if you have MyChart) OR A paper copy in the mail If you have any lab test that is abnormal or we need to change your treatment, we will call you to review the results.   Testing/Procedures: None ordered   Follow-Up: At [facility], you and your health needs are our priority.  As part of our continuing mission to provide you with exceptional heart care, we have created designated Provider Care Teams.  These Care Teams include your primary Cardiologist (physician) and Advanced Practice Providers (APPs -  Physician Assistants and Nurse Practitioners) who all work together to provide you with the care you need, when you need it.  We recommend signing up for the patient portal called "MyChart".  Sign up information is provided on this After Visit Summary.  MyChart is used to connect with patients for Virtual Visits (Telemedicine).  Patients are able to view lab/test results, encounter notes, upcoming appointments, etc.  Non-urgent messages can be sent to your provider as well.   To learn more about what you can do with MyChart, go to [email or web address].    Your next appointment:   12 month(s)  Provider:   Dietrich Pates, MD  or Tereso Newcomer, PA-C         Other Instructions

## 2023-04-09 NOTE — Assessment & Plan Note (Signed)
Followed by nephrology with stable kidney function (Creatinine 1.52, GFR 48). -Continue follow up with nephrology.

## 2023-04-09 NOTE — Assessment & Plan Note (Signed)
Intol of statins due to myalgias. Primary care recently placed him on Zetia. -Continue Zetia 10mg  daily. -Obtain next lipid panel from primary care. -If LDL > 70, refer to PharmD for PCSK9 inhibitor.

## 2023-04-09 NOTE — Progress Notes (Signed)
Cardiology Office Note:    Date:  04/09/2023  ID:  Travis Conrad, DOB [birth year], MRN [id number] PCP: [name], MD  [facility] Providers Cardiologist:  Dietrich Pates, MD Electrophysiologist:  Regan Lemming, MD       Patient Profile:      Paroxysmal atrial fibrillation S/p PVI ablation 09/2019 ([place]) TTE 02/23/21: EF 60-65, no RWMA, mod LVH, NL RVSF, trivial MR Amiodarone Rx (started 02/2021 due to recurrent AF in setting of sepsis, diverticulitis) Coronary artery disease  Pre PVI ablation CT 08/16/19: CAC score 1980 (94th percentile); LCx and LAD disease - FFR normal  Hypertension  Hyperlipidemia  Chronic kidney disease  Diverticulitis s/p partial colectomy in 02/2021 Tobacco use ETOH abuse          History of Present Illness:  Discussed the use of AI scribe software for clinical note transcription with the patient, who gave verbal consent to proceed.  Travis Conrad is a 72 y.o. male who returns for follow up of CAD, AFib. He was last seen in 03/2022 with Travis Searing, NP. He has follow up with Travis Dowse, PA-C with EP 03/26/23. His Amiodarone was reduced to 100 mg once daily. He will follow up with EP again in Jan 2025 to determine if Amiodarone can be DC'd. He is here with his wife. He has not had recurrent symptoms of palpitations. He has not had chest pain, shortness of breath, syncope, orthopnea, edema. He has quit smoking and drinking alcohol. He does bruise easily. He has not had melena, hematochezia.      Review of Systems  Hematologic/Lymphatic: Bruises/bleeds easily.  Gastrointestinal:  Negative for hematochezia and melena.  Genitourinary:  Negative for hematuria.  See HPI    Studies Reviewed:        Results   LABS Creatinine: 1.52 mg/dL (40/03/2724) GFR: 48 DG/UYQ/0.34V (01/29/2023)      Risk Assessment/Calculations:    CHA2DS2-VASc Score = 3   This indicates a 3.2% annual risk of stroke. The patient's score is based  upon: CHF History: 0 HTN History: 1 Diabetes History: 0 Stroke History: 0 Vascular Disease History: 1 Age Score: 1 Gender Score: 0            Physical Exam:   VS:  BP 130/60   Pulse 60   Ht 6' (1.829 m)   Wt 211 lb 12.8 oz (96.1 kg)   SpO2 96%   BMI 28.73 kg/m    Wt Readings from Last 3 Encounters:  04/09/23 211 lb 12.8 oz (96.1 kg)  03/26/23 214 lb 6.4 oz (97.3 kg)  01/13/23 220 lb 0.3 oz (99.8 kg)    Constitutional:      Appearance: Healthy appearance. Not in distress.  Neck:     Vascular: No JVR.  Pulmonary:     Breath sounds: Normal breath sounds. No wheezing. No rales.  Cardiovascular:     Normal rate. Regular rhythm.     Murmurs: There is no murmur.  Edema:    Peripheral edema absent.  Abdominal:     Palpations: Abdomen is soft.      Assessment and Plan:   Assessment & Plan Coronary artery disease involving native coronary artery of native heart without angina pectoris CT in 2021 with coronary artery calcium score 1980 placing him in the 94th percentile. No current symptoms of angina. On Zetia 10mg  daily for cholesterol management. He is not on daily antiplatelet therapy as he is on Eliquis. -Continue Zetia 10mg  daily. -Continue NTG prn  chest pain -F/u 1 year.  Paroxysmal atrial fibrillation (HCC) Status post PVI ablation in April 2021 with recurrence in September 2022 during sepsis and diverticulitis. Currently in sinus rhythm. He is following closely with EP with a plan to eventually DC Amiodarone.  -Continue Amiodarone 100mg  daily, Eliquis 5mg  twice daily, and Cardizem CD 120mg  daily.  -Follow up with electrophysiology in January 2025. Pure hypercholesterolemia Intol of statins due to myalgias. Primary care recently placed him on Zetia. -Continue Zetia 10mg  daily. -Obtain next lipid panel from primary care. -If LDL > 70, refer to PharmD for PCSK9 inhibitor. Essential hypertension Blood pressure controlled on Cardizem CD 120mg  daily. -Continue  Cardizem CD 120mg  daily. Stage 3a chronic kidney disease (HCC) Followed by nephrology with stable kidney function (Creatinine 1.52, GFR 48). -Continue follow up with nephrology.       Dispo:  Return in about 1 year (around 04/08/2024) for Routine Follow Up, w/ Dr. Tenny Craw, or Tereso Newcomer, PA-C.  Signed, Tereso Newcomer, PA-C

## 2023-04-09 NOTE — Assessment & Plan Note (Signed)
Status post PVI ablation in April 2021 with recurrence in September 2022 during sepsis and diverticulitis. Currently in sinus rhythm. He is following closely with EP with a plan to eventually DC Amiodarone.  -Continue Amiodarone 100mg  daily, Eliquis 5mg  twice daily, and Cardizem CD 120mg  daily.  -Follow up with electrophysiology in January 2025.

## 2023-04-09 NOTE — Assessment & Plan Note (Signed)
CT in 2021 with coronary artery calcium score 1980 placing him in the 94th percentile. No current symptoms of angina. On Zetia 10mg  daily for cholesterol management. He is not on daily antiplatelet therapy as he is on Eliquis. -Continue Zetia 10mg  daily. -Continue NTG prn chest pain -F/u 1 year.

## 2023-04-13 ENCOUNTER — Ambulatory Visit: Payer: Medicare Other | Admitting: Nurse Practitioner

## 2023-04-30 ENCOUNTER — Other Ambulatory Visit: Payer: Self-pay | Admitting: Nurse Practitioner

## 2023-04-30 DIAGNOSIS — I480 Paroxysmal atrial fibrillation: Secondary | ICD-10-CM

## 2023-04-30 NOTE — Telephone Encounter (Signed)
Eliquis 5mg  refill request received. Patient is 72 years old, weight-96.1kg, Crea-1.33 on 01/12/23, Diagnosis-Afib, and last seen by Tereso Newcomer on 04/09/23. Dose is appropriate based on dosing criteria. Will send in refill to requested pharmacy.

## 2023-05-26 ENCOUNTER — Other Ambulatory Visit: Payer: Self-pay | Admitting: Cardiology

## 2023-06-25 NOTE — Progress Notes (Signed)
  Electrophysiology Office Note:   Date:  06/26/2023  ID:  Travis Conrad, DOB 02/26/1951, MRN [id number]  Primary Cardiologist: Vina Gull, MD Electrophysiologist: Soyla Gladis Norton, MD      History of Present Illness:   Travis Conrad is a 73 y.o. male with h/o HTN and AF seen today for routine electrophysiology followup.      Complicated admission 02/19/2021 through 04/06/2021. He was found to have a pelvic abscess and eventually underwent sigmoid colectomy and diverting colostomy. He developed postop ileus. He had knee pain and was found to have an effusion. He was discharged to [facility] and discharged from CIR 04/21/2021.    He saw Dr. Norton 09/22/21, no symptoms of AFib, continued to f/u with GI/docs for timing of ostomy reversal.  He was a bit bloated, had some weight gain, SOB, planned for BNP   Saw E. DIck, NP 03/2023, ding well, no changes were made.   Underwent ostomy reversal discharged 01/13/23, on POD #5      Since last being seen in our clinic the patient reports doing very well. Maintaining NSR by symptoms on lower dose of amiodarone . Does have easy bruising and high cost of Eliquis  (Averages about $140 a month). Remains interested in [place]. Currently,  he denies chest pain, palpitations, dyspnea, PND, orthopnea, nausea, vomiting, dizziness, syncope, edema, weight gain, or early satiety.   Review of systems complete and found to be negative unless listed in HPI.   EP Information / Studies Reviewed:    EKG is not ordered today. EKG from 01/01/2023 reviewed which showed NSR at 60 bpm       Arrhythmia/AAD hx PVI ablation 10/03/19 Sotalol  stopped during hospitalization with diverticular perf, hypotension w/rapid AF >> amio Amiodarone  started 2022  02/23/21 Echo LVEF 60-65%, Mod LVH, norml RV, trivial MR    10/03/19 EPS/ablation CONCLUSIONS: 1. Sinus rhythm upon presentation.   2. Successful electrical isolation and anatomical encircling of all four pulmonary veins  with radiofrequency current. 3. No inducible arrhythmias following ablation both on and off of Isuprel 4. No early apparent complications.  Physical Exam:   VS:  BP (!) 146/70   Pulse 78   Ht 6' (1.829 m)   Wt 214 lb (97.1 kg)   SpO2 98%   BMI 29.02 kg/m    Wt Readings from Last 3 Encounters:  06/26/23 214 lb (97.1 kg)  04/09/23 211 lb 12.8 oz (96.1 kg)  03/26/23 214 lb 6.4 oz (97.3 kg)     GEN: No acute distress NECK: No JVD; No carotid bruits CARDIAC: Regular rate and rhythm, no murmurs, rubs, gallops RESPIRATORY:  Clear to auscultation without rales, wheezing or rhonchi  ABDOMEN: Soft, non-tender, non-distended EXTREMITIES:  No edema; No deformity   ASSESSMENT AND PLAN:    Paroxysmal AF Regular by exam and no symptoms by history.  Continue eliquis  5 mg BID for CHA2DS2VASc of at least 2 Continue amiodarone  100 mg daily Surveillance lab today.  He is interested in [place] due to frequent and easy bruising, as well as high cost of Eliquis . Will have see one of our Implanters.   HTN Stable on current regimen   Secondary hypercoagulable state Pt on Eliquis  as above     Follow up with Dr. Kennyth to consider [facility]. Otherwise, 6 months with EP APP.   Signed, Travis Prentice Passey, PA-C

## 2023-06-26 ENCOUNTER — Ambulatory Visit: Payer: Medicare Other | Attending: Student | Admitting: Student

## 2023-06-26 ENCOUNTER — Encounter: Payer: Self-pay | Admitting: Student

## 2023-06-26 VITALS — BP 146/70 | HR 78 | Ht 72.0 in | Wt 214.0 lb

## 2023-06-26 DIAGNOSIS — I10 Essential (primary) hypertension: Secondary | ICD-10-CM | POA: Diagnosis not present

## 2023-06-26 DIAGNOSIS — N1831 Chronic kidney disease, stage 3a: Secondary | ICD-10-CM | POA: Diagnosis not present

## 2023-06-26 DIAGNOSIS — I480 Paroxysmal atrial fibrillation: Secondary | ICD-10-CM

## 2023-06-26 DIAGNOSIS — Z79899 Other long term (current) drug therapy: Secondary | ICD-10-CM

## 2023-06-26 LAB — COMPREHENSIVE METABOLIC PANEL
ALT: 15 [IU]/L (ref 0–44)
AST: 14 [IU]/L (ref 0–40)
Albumin: 4.2 g/dL (ref 3.8–4.8)
Alkaline Phosphatase: 113 [IU]/L (ref 44–121)
BUN/Creatinine Ratio: 15 (ref 10–24)
BUN: 17 mg/dL (ref 8–27)
Bilirubin Total: 0.4 mg/dL (ref 0.0–1.2)
CO2: 26 mmol/L (ref 20–29)
Calcium: 9.6 mg/dL (ref 8.6–10.2)
Chloride: 103 mmol/L (ref 96–106)
Creatinine, Ser: 1.17 mg/dL (ref 0.76–1.27)
Globulin, Total: 2.3 g/dL (ref 1.5–4.5)
Glucose: 110 mg/dL — ABNORMAL HIGH (ref 70–99)
Potassium: 5 mmol/L (ref 3.5–5.2)
Sodium: 140 mmol/L (ref 134–144)
Total Protein: 6.5 g/dL (ref 6.0–8.5)
eGFR: 66 mL/min/{1.73_m2} (ref >59)

## 2023-06-26 LAB — T4, FREE: Free T4: 2.84 ng/dL — ABNORMAL HIGH (ref 0.82–1.77)

## 2023-06-26 LAB — TSH: TSH: <0.005 u[IU]/mL — ABNORMAL LOW (ref 0.450–4.500)

## 2023-06-26 NOTE — Patient Instructions (Signed)
 Medication Instructions:  Your physician recommends that you continue on your current medications as directed. Please refer to the Current Medication list given to you today.  *If you need a refill on your cardiac medications before your next appointment, please call your pharmacy*  Lab Work: CMET, TSH, FreeT4-TODAY If you have labs (blood work) drawn today and your tests are completely normal, you will receive your results only by: MyChart Message (if you have MyChart) OR A paper copy in the mail If you have any lab test that is abnormal or we need to change your treatment, we will call you to review the results.  Follow-Up: At [facility], you and your health needs are our priority.  As part of our continuing mission to provide you with exceptional heart care, we have created designated Provider Care Teams.  These Care Teams include your primary Cardiologist (physician) and Advanced Practice Providers (APPs -  Physician Assistants and Nurse Practitioners) who all work together to provide you with the care you need, when you need it.  Your next appointment:   6 month(s)  Provider:   Ozell Jodie Passey, PA-C    Schedule 6-8 week follow up with Dr Kennyth to discuss [facility]

## 2023-07-06 ENCOUNTER — Telehealth: Payer: Self-pay | Admitting: Cardiology

## 2023-07-06 NOTE — Telephone Encounter (Signed)
Calling to confirm that this dx code is active I25.10  And to see if ezetimibe (ZETIA) 10 MG tablet  can be consider to a statin. Please advise

## 2023-07-09 NOTE — Telephone Encounter (Signed)
Forwarding back to medical records as I cannot speak with insurance ([employer]).  Forwarding to Dr. Charlott Rakes nurse to put pt back on their radar for an overdue follow up appointment

## 2023-07-09 NOTE — Telephone Encounter (Signed)
Pt saw APP/ Lorin Picket 03/2023 and recall is in to see Dr Tenny Craw Fall 2025.. will keep follow up for call from [facility].

## 2023-07-13 ENCOUNTER — Telehealth: Payer: Self-pay | Admitting: Cardiology

## 2023-07-13 NOTE — Telephone Encounter (Signed)
Calling to verify a patient diagnose.  Please advise

## 2023-07-18 NOTE — Telephone Encounter (Signed)
Travis Conrad of [facility]) [phone]  Threasa Alpha A5 days ago   [phone]  Incoming call   Called and LM for [name] with [employer] since our Med Rec dept says they no longer manage these calls... I asked her to call me back and to see if she is able to obtain a signed release of information for the pt... not sure if this is needed but need to be sure following HIPAA rules.   I also LM for the pt to alert him that they are asking for a diagnosis on him and to see if he can verbally allow me to talk with them when they call back.   Pt has recall with Dr Tenny Craw Fall 2025... she prefers to stagger appts with EP and he is seeing Dr Elberta Fortis 08/2023.

## 2023-07-19 NOTE — Telephone Encounter (Signed)
Patient is returning call and is requesting call back.

## 2023-07-23 NOTE — Telephone Encounter (Signed)
Left another message for the pt to cal back.

## 2023-07-24 NOTE — Telephone Encounter (Signed)
Pts spouse on DPR says they rea not sure why the Ins Co would need anything from [place] but she gives [place] permission if we feel best for the pt to give them the information they are needing if it has to do with billing and it will benefit the pt.

## 2023-07-24 NOTE — Telephone Encounter (Signed)
Pt spouse returning call  

## 2023-07-24 NOTE — Telephone Encounter (Signed)
Left another message for Travis Conrad with [employer].. will close this encounter for now until she returns my call.

## 2023-07-26 NOTE — Progress Notes (Signed)
Left another msg for pt to return call

## 2023-07-27 ENCOUNTER — Telehealth: Payer: Self-pay | Admitting: Internal Medicine

## 2023-07-27 NOTE — Telephone Encounter (Signed)
Wife is returning call. Please advise

## 2023-07-27 NOTE — Telephone Encounter (Signed)
 Pt c/o medication issue:  1. Name of Medication: Statin medications.   2. How are you currently taking this medication (dosage and times per day)?   3. Are you having a reaction (difficulty breathing--STAT)?   4. What is your medication issue? [employer] Pharmacy Department is requesting call back to get more information on allergy patient seems to have to statin medications. Please advise.

## 2023-07-27 NOTE — Telephone Encounter (Signed)
 Pt c/o medication issue:   1. Name of Medication: Statin medications.    2. How are you currently taking this medication (dosage and times per day)?    3. Are you having a reaction (difficulty breathing--STAT)?    4. What is your medication issue? [employer] Pharmacy Department is requesting call back to get more information on allergy patient seems to have to statin medications. Please advise.     Called and left VMM for patient to call back to confirm previous statins tried and type of reactions since office notes stated intolerant of statins due to athralgias.

## 2023-07-27 NOTE — Telephone Encounter (Signed)
 Called and spoke with patient's wife regarding statin allergy.  She states that he had generalized body itching with generalized arthralgias and groin pain. She states that's why he was started on the Zetia instead for his high cholesterol.

## 2023-07-27 NOTE — Telephone Encounter (Signed)
 Pt c/o medication issue:   1. Name of Medication: Statin medications.    2. How are you currently taking this medication (dosage and times per day)?    3. Are you having a reaction (difficulty breathing--STAT)?    4. What is your medication issue? [employer] Pharmacy Department is requesting call back to get more information on allergy patient seems to have to statin medications. Please advise.        Note   [facility]aur[facility] [phone]  Lendia Bake hour ago (8:57 AM)   Ext [phone]    Florence Credit at [employer] [place]  and explained that patient had significant reaction to taking Lipitor and due to that reaction was switched over to Zetia which he seems to be tolerating at this time. Explainid that per his wife he had generalized body itching, general athralgias and groin pain.  She stated she would sent over a fax to having the doctor update his records for his next office visit to include documentation of drug induced intolerances.

## 2023-08-10 NOTE — Telephone Encounter (Signed)
 Spoke to [employer].   Aware not sure if we have received a fax but will forward to [facility] & assist to address this next week. (They understand we did not order the Zetia, but need clarification because last OV states: Pure hypercholesterolemia Intol of statins due to myalgias   Aware forwarding to his team to address. She did say that when we fax back, fax to [phone]

## 2023-08-10 NOTE — Telephone Encounter (Signed)
 Calling to see if the fax was received. If so ask fax paperwork to [phone]. Please advise

## 2023-08-11 NOTE — Telephone Encounter (Signed)
 Since primary care is the prescriber, [employer] really should get in touch with them to get records updated.  Ezetimibe (Zetia) is not a statin and intolerance to statins should not be a concern for Ezetimibe. Tereso Newcomer, PA-C    08/11/2023 12:07 PM

## 2023-08-17 ENCOUNTER — Ambulatory Visit: Payer: Medicare Other | Admitting: Cardiology

## 2023-08-17 ENCOUNTER — Telehealth: Payer: Self-pay | Admitting: Physician Assistant

## 2023-08-17 NOTE — Telephone Encounter (Signed)
 Dedra with [employer] called in asking if we received fax again about statins and Zetia medications. Please advise.

## 2023-08-21 NOTE — Telephone Encounter (Signed)
 Returned call again, voicemail automatically comes on.  Had to leave another message.

## 2023-08-21 NOTE — Telephone Encounter (Signed)
 Returned call to [employer] with [employer].  Had to leave a detailed message and advised if she still needed further review, to call the office back.

## 2023-08-21 NOTE — Telephone Encounter (Signed)
 Dedra with [employer] is returning [place] phone call. Requesting call back.

## 2023-08-30 ENCOUNTER — Emergency Department (HOSPITAL_BASED_OUTPATIENT_CLINIC_OR_DEPARTMENT_OTHER)
Admission: EM | Admit: 2023-08-30 | Discharge: 2023-08-30 | Disposition: A | Discharge status: Home / Self Care | Attending: Emergency Medicine | Admitting: Emergency Medicine

## 2023-08-30 ENCOUNTER — Other Ambulatory Visit: Payer: Self-pay

## 2023-08-30 ENCOUNTER — Encounter (HOSPITAL_BASED_OUTPATIENT_CLINIC_OR_DEPARTMENT_OTHER): Payer: Self-pay | Admitting: *Deleted

## 2023-08-30 DIAGNOSIS — M25572 Pain in left ankle and joints of left foot: Secondary | ICD-10-CM | POA: Diagnosis present

## 2023-08-30 DIAGNOSIS — Z7901 Long term (current) use of anticoagulants: Secondary | ICD-10-CM | POA: Diagnosis not present

## 2023-08-30 MED ORDER — OXYCODONE HCL 5 MG PO TABS
5.0000 mg | ORAL_TABLET | Freq: Once | ORAL | Status: AC
  Administered 2023-08-30: 5 mg via ORAL
  Filled 2023-08-30: qty 1

## 2023-08-30 MED ORDER — ACETAMINOPHEN 500 MG PO TABS
1000.0000 mg | ORAL_TABLET | Freq: Once | ORAL | Status: AC
  Administered 2023-08-30: 1000 mg via ORAL
  Filled 2023-08-30: qty 2

## 2023-08-30 MED ORDER — DIAZEPAM 5 MG PO TABS
5.0000 mg | ORAL_TABLET | Freq: Once | ORAL | Status: AC
  Administered 2023-08-30: 5 mg via ORAL
  Filled 2023-08-30: qty 1

## 2023-08-30 MED ORDER — KETOROLAC TROMETHAMINE 15 MG/ML IJ SOLN
15.0000 mg | Freq: Once | INTRAMUSCULAR | Status: AC
  Administered 2023-08-30: 15 mg via INTRAMUSCULAR
  Filled 2023-08-30: qty 1

## 2023-08-30 NOTE — ED Notes (Signed)
 Pt discharged home after verbalizing understanding of discharge instructions; nad noted.

## 2023-08-30 NOTE — ED Triage Notes (Signed)
 Pt is here for severe pain in left ankle since mid morning today, not associated with any trauma.  Pt has been having issues with pain in left side and was recently seen [facility] who told pt that he had peripheral neuropathy.

## 2023-08-30 NOTE — Discharge Instructions (Signed)
 Follow up with your doctor in the office.

## 2023-08-30 NOTE — ED Provider Notes (Signed)
 [place] [facility]ERGENCY DEPARTMENT AT [facility] Provider Note   CSN: [id number] Arrival date & time: 08/30/23  1254     History  Chief Complaint  Patient presents with   Ankle Pain    Travis Conrad is a 73 y.o. male.  73 yo M with a chief complaints of left ankle pain.  Patient has had trouble with left-sided pain off and on for some time.  Had recently seen an orthopedist and then was referred to a neurosurgeon and then had recently had what sounds like an EMG performed.  Patient states that he is having worsening pain today.  Has been having some sharp pains that seem to come and go.  Makes him suddenly uncomfortable and then goes away.  Occurred to the dorsum of the foot yesterday and now is feeling it more on the lateral ankle.  Seems occur more with rest.  Has not had any issues when he is up and walking.  Denies trauma to the area.  Denies swelling.   Ankle Pain      Home Medications Prior to Admission medications   Medication Sig Start Date End Date Taking? Authorizing Provider  acetaminophen (TYLENOL) 325 MG tablet Take 2 tablets (650 mg total) by mouth every 6 (six) hours as needed for mild pain. 01/13/23   Kinsinger, [name], MD  amiodarone (PACERONE) 200 MG tablet Take 0.5 tablets (100 mg total) by mouth daily. 03/26/23   Sheilah Pigeon, PA-C  apixaban (ELIQUIS) 5 MG TABS tablet TAKE 1 TABLET(5 MG) BY MOUTH TWICE DAILY 04/30/23   Tereso Newcomer T, PA-C  diltiazem (CARDIZEM CD) 120 MG 24 hr capsule TAKE 1 CAPSULE(120 MG) BY MOUTH DAILY 05/29/23   Camnitz, Andree Coss, MD  ezetimibe (ZETIA) 10 MG tablet Take 10 mg by mouth daily.    [provider]  Melatonin 10 MG TBCR Take 10 mg by mouth at bedtime as needed (sleep).    [provider]  nitroGLYCERIN (NITROSTAT) 0.4 MG SL tablet Place 1 tablet (0.4 mg total) under the tongue every 5 (five) minutes as needed for chest pain. 04/19/21   Angiulli, Mcarthur Rossetti, PA-C  OVER THE COUNTER MEDICATION  Take 2 capsules by mouth at bedtime. ZZZquil pure Zs sleep aid (2nd)    [provider]  OVER THE COUNTER MEDICATION Take 1 tablet by mouth at bedtime as needed (sleep). 3 AM otc melatonin sleep aid    [provider]  OVER THE COUNTER MEDICATION Take 1 tablet by mouth at bedtime as needed (sleep). Nature Made Back to sleep otc sleep aid    [provider]  OVER THE COUNTER MEDICATION Take 2 tablets by mouth at bedtime Vicks Triple ZZZ o-t-c sleep aid  (3rd)    [provider]  tamsulosin (FLOMAX) 0.4 MG CAPS capsule Take 0.4 mg by mouth daily.    [provider]  zolpidem (AMBIEN) 10 MG tablet Take 10 mg by mouth at bedtime.    [provider]      Allergies    Statins and Tape    Review of Systems   Review of Systems  Physical Exam Updated Vital Signs BP (!) 143/73   Pulse 86   Temp 97.8 F (36.6 C) (Oral)   Resp 16   SpO2 95%  Physical Exam Vitals and nursing note reviewed.  Constitutional:      Appearance: He is well-developed.  HENT:     Head: Normocephalic and atraumatic.  Eyes:  Pupils: Pupils are equal, round, and reactive to light.  Neck:     Vascular: No JVD.  Cardiovascular:     Rate and Rhythm: Normal rate and regular rhythm.     Heart sounds: No murmur heard.    No friction rub. No gallop.  Pulmonary:     Effort: No respiratory distress.     Breath sounds: No wheezing.  Abdominal:     General: There is no distension.     Tenderness: There is no abdominal tenderness. There is no guarding or rebound.  Musculoskeletal:        General: Normal range of motion.     Cervical back: Normal range of motion and neck supple.     Comments: No obvious palpable area of tenderness.  Pulse motor and sensation intact.  No erythema no warmth no edema.  Skin:    Coloration: Skin is not pale.     Findings: No rash.  Neurological:     Mental Status: He is alert and oriented to person, place, and time.  Psychiatric:         Behavior: Behavior normal.     ED Results / Procedures / Treatments   Labs (all labs ordered are listed, but only abnormal results are displayed) Labs Reviewed - No data to display  EKG None  Radiology No results found.  Procedures Procedures    Medications Ordered in ED Medications  acetaminophen (TYLENOL) tablet 1,000 mg (has no administration in time range)  ketorolac (TORADOL) 15 MG/ML injection 15 mg (has no administration in time range)  oxyCODONE (Oxy IR/ROXICODONE) immediate release tablet 5 mg (has no administration in time range)  diazepam (VALIUM) tablet 5 mg (has no administration in time range)    ED Course/ Medical Decision Making/ A&P                                 Medical Decision Making Risk OTC drugs. Prescription drug management.   73 yo M with a chief complaints of left ankle pain.  The patient tells me that he has had months of left-sided discomfort.  He has seen rheumatology and neurosurgery and orthopedics for this.  He feels like his left foot and ankle pain have gotten a bit worse over the past 24 hours.  He feels like he needs a pain prescription for this.  I will treat his pain here.  Encouraged him to follow-up with his family doctor or specialist for further treatment of his pain.   1:37 PM:  I have discussed the diagnosis/risks/treatment options with the patient.  Evaluation and diagnostic testing in the emergency department does not suggest an emergent condition requiring admission or immediate intervention beyond what has been performed at this time.  They will follow up with PCP. We also discussed returning to the ED immediately if new or worsening sx occur. We discussed the sx which are most concerning (e.g., sudden worsening pain, fever, inability to tolerate by mouth) that necessitate immediate return. Medications administered to the patient during their visit and any new prescriptions provided to the patient are listed  below.  Medications given during this visit Medications  acetaminophen (TYLENOL) tablet 1,000 mg (has no administration in time range)  ketorolac (TORADOL) 15 MG/ML injection 15 mg (has no administration in time range)  oxyCODONE (Oxy IR/ROXICODONE) immediate release tablet 5 mg (has no administration in time range)  diazepam (VALIUM) tablet 5 mg (has no administration  in time range)     The patient appears reasonably screen and/or stabilized for discharge and I doubt any other medical condition or other [facility] requiring further screening, evaluation, or treatment in the ED at this time prior to discharge.          Final Clinical Impression(s) / ED Diagnoses Final diagnoses:  Acute left ankle pain    Rx / DC Orders ED Discharge Orders     None         Melene Plan, DO 08/30/23 1337

## 2023-09-04 ENCOUNTER — Ambulatory Visit: Payer: Medicare Other | Attending: Cardiology | Admitting: Cardiology

## 2023-09-04 ENCOUNTER — Encounter: Payer: Self-pay | Admitting: Cardiology

## 2023-09-04 VITALS — BP 120/64 | HR 61 | Ht 72.0 in | Wt 211.0 lb

## 2023-09-04 DIAGNOSIS — I480 Paroxysmal atrial fibrillation: Secondary | ICD-10-CM | POA: Diagnosis not present

## 2023-09-04 DIAGNOSIS — D6869 Other thrombophilia: Secondary | ICD-10-CM

## 2023-09-04 DIAGNOSIS — I10 Essential (primary) hypertension: Secondary | ICD-10-CM | POA: Diagnosis not present

## 2023-09-04 NOTE — Progress Notes (Signed)
  Electrophysiology Office Note:   Date:  09/04/2023  ID:  Travis Conrad, DOB April 27, 1951, MRN [id number]  Primary Cardiologist: Dietrich Pates, MD Primary Heart Failure: None Electrophysiologist: Versia Mignogna Jorja Loa, MD      History of Present Illness:   Travis Conrad is a 73 y.o. male with h/o hypertension, atrial fibrillation seen today for routine electrophysiology followup.   Since last being seen in our clinic the patient reports doing overall well.  He has no chest pain or shortness of breath.  He is in sinus rhythm and has not noted further episodes of atrial fibrillation.  He has reduced his amiodarone down to 100 mg daily.  he denies chest pain, palpitations, dyspnea, PND, orthopnea, nausea, vomiting, dizziness, syncope, edema, weight gain, or early satiety.   Review of systems complete and found to be negative unless listed in HPI.   EP Information / Studies Reviewed:    EKG is ordered today. Personal review as below.  EKG Interpretation Date/Time:  Tuesday September 04 2023 09:12:02 EDT Ventricular Rate:  61 PR Interval:  194 QRS Duration:  84 QT Interval:  412 QTC Calculation: 414 R Axis:   69  Text Interpretation: Sinus rhythm with Premature atrial complexes When compared with ECG of 01-Jan-2023 10:54, Premature atrial complexes are now Present Confirmed by Tige Meas ([id number]) on 09/04/2023 9:33:13 AM     Risk Assessment/Calculations:    CHA2DS2-VASc Score = 2   This indicates a 2.2% annual risk of stroke. The patient's score is based upon: CHF History: 0 HTN History: 1 Diabetes History: 0 Stroke History: 0 Vascular Disease History: 0 Age Score: 1 Gender Score: 0             Physical Exam:   VS:  BP 120/64 (BP Location: Left Arm, Patient Position: Sitting, Cuff Size: Large)   Pulse 61   Ht 6' (1.829 m)   Wt 211 lb (95.7 kg)   SpO2 95%   BMI 28.62 kg/m    Wt Readings from Last 3 Encounters:  09/04/23 211 lb (95.7 kg)  06/26/23 214 lb (97.1 kg)   04/09/23 211 lb 12.8 oz (96.1 kg)     GEN: Well nourished, well developed in no acute distress NECK: No JVD; No carotid bruits CARDIAC: Regular rate and rhythm, no murmurs, rubs, gallops RESPIRATORY:  Clear to auscultation without rales, wheezing or rhonchi  ABDOMEN: Soft, non-tender, non-distended EXTREMITIES:  No edema; No deformity   ASSESSMENT AND PLAN:    1.  Paroxysmal atrial fibrillation: Currently on amiodarone 100 mg daily.  He has had continued episodes of atrial fibrillation.  For now, he is happy with his current amiodarone regimen.  We did discuss the option of ablation as well as watchman.  For now, he is happy with his care.  2.  Hypertension: Currently well-controlled  3.  Secondary hypercoagulable state: Currently on Eliquis 5 mg twice daily for atrial fibrillation  Follow up with Afib Clinic in 6 months  Signed, Julionna Marczak Jorja Loa, MD

## 2023-09-04 NOTE — Patient Instructions (Signed)
 Medication Instructions:  Your physician recommends that you continue on your current medications as directed. Please refer to the Current Medication list given to you today.  *If you need a refill on your cardiac medications before your next appointment, please call your pharmacy*   Lab Work: None ordered   Testing/Procedures: None ordered   Follow-Up: At [facility], you and your health needs are our priority.  As part of our continuing mission to provide you with exceptional heart care, we have created designated Provider Care Teams.  These Care Teams include your primary Cardiologist (physician) and Advanced Practice Providers (APPs -  Physician Assistants and Nurse Practitioners) who all work together to provide you with the care you need, when you need it.  Your next appointment:   6 month(s)  The format for your next appointment:   In Person  Provider:   You will follow up in the Atrial Fibrillation Clinic located at [facility]. Your provider will be: Clint R. Fenton, PA-C or Lake Bells, PA-C{   Thank you for choosing [facility]!!   Dory Horn, RN [phone]  Other Instructions

## 2023-10-31 ENCOUNTER — Other Ambulatory Visit: Payer: Self-pay | Admitting: Physician Assistant

## 2023-10-31 DIAGNOSIS — I480 Paroxysmal atrial fibrillation: Secondary | ICD-10-CM

## 2023-10-31 NOTE — Telephone Encounter (Signed)
 Eliquis  5mg  refill request received. Patient is 73 years old, weight-95.7kg, Crea-1.17 on 06/26/23, Diagnosis-Afib, and last seen by Dr. Lawana Pray on 09/04/23. Dose is appropriate based on dosing criteria. Will send in refill to requested pharmacy.

## 2023-12-25 ENCOUNTER — Ambulatory Visit: Admitting: Neurology

## 2024-01-01 LAB — LAB REPORT - SCANNED: EGFR: 54

## 2024-01-02 ENCOUNTER — Encounter: Payer: Self-pay | Admitting: Nephrology

## 2024-01-08 DIAGNOSIS — M720 Palmar fascial fibromatosis [Dupuytren]: Secondary | ICD-10-CM | POA: Insufficient documentation

## 2024-01-08 DIAGNOSIS — R768 Other specified abnormal immunological findings in serum: Secondary | ICD-10-CM | POA: Insufficient documentation

## 2024-01-08 DIAGNOSIS — R209 Unspecified disturbances of skin sensation: Secondary | ICD-10-CM | POA: Insufficient documentation

## 2024-01-08 DIAGNOSIS — M5430 Sciatica, unspecified side: Secondary | ICD-10-CM | POA: Insufficient documentation

## 2024-01-11 ENCOUNTER — Encounter: Payer: Self-pay | Admitting: Neurology

## 2024-01-11 ENCOUNTER — Ambulatory Visit
Admission: RE | Admit: 2024-01-11 | Discharge: 2024-01-11 | Disposition: A | Discharge status: Home / Self Care | Source: Ambulatory Visit | Attending: Neurology | Admitting: Neurology

## 2024-01-11 ENCOUNTER — Ambulatory Visit: Admitting: Neurology

## 2024-01-11 VITALS — BP 133/89 | HR 61 | Ht 72.0 in | Wt 220.0 lb

## 2024-01-11 DIAGNOSIS — M25551 Pain in right hip: Secondary | ICD-10-CM

## 2024-01-11 DIAGNOSIS — M5412 Radiculopathy, cervical region: Secondary | ICD-10-CM

## 2024-01-11 DIAGNOSIS — M542 Cervicalgia: Secondary | ICD-10-CM

## 2024-01-11 DIAGNOSIS — M25552 Pain in left hip: Secondary | ICD-10-CM

## 2024-01-11 DIAGNOSIS — R269 Unspecified abnormalities of gait and mobility: Secondary | ICD-10-CM | POA: Diagnosis not present

## 2024-01-11 DIAGNOSIS — R4189 Other symptoms and signs involving cognitive functions and awareness: Secondary | ICD-10-CM

## 2024-01-11 MED ORDER — GABAPENTIN 300 MG PO CAPS: 600.0000 mg | ORAL_CAPSULE | Freq: Three times a day (TID) | ORAL | 11 refills | Status: AC | PRN

## 2024-01-11 MED ORDER — DULOXETINE HCL 30 MG PO CPEP: 30.0000 mg | ORAL_CAPSULE | Freq: Every day | ORAL | 6 refills | Status: DC

## 2024-01-11 NOTE — Progress Notes (Signed)
 Chief Complaint  Patient presents with   RM 14    Patient is here with his wife for referral for peripheral neuropathy. Patient's wife states he has seen pcp, ortho, neurosurgery, EMG done, and now referred to neurology. Wife brought MRI lumbar spine results. Patient's wife states he has symptoms from the left side of the head behind ear down. Patient states right now he has pain behind the ear down to his shoulder and into the hand. He also has constant pain in L buttock. In past days he had pain radiating down into foot, improved w/ gabapentin .    ASSESSMENT AND PLAN  Travis Conrad is a 73 y.o. male   History of heavy alcohol abuse, smoking, Sepsis due to sigmoid diverticulitis, perforation, abscess, status post colectomy in September 2022, Slow worsening memory loss since, strong family history of dementia, MoCA examination 17/30  MRI of the brain for evaluation of his cognitive complaints  Laboratory evaluation to rule out treatable etiology, patient would like to have Alzheimer's related profile due to mother's history of Alzheimer's disease  Acute onset of left neck pain radiating pain to left upper extremity  MRI of the cervical spine to rule out cervical radiculopathy, Chronic low back pain bilateral hip pain,  Would not explain by his MRI findings, most noticeable at L5-S1 with moderate right foraminal narrowing, no canal stenosis  X-ray of bilateral hip   DIAGNOSTIC DATA (LABS, IMAGING, TESTING) - I reviewed patient records, labs, notes, testing and imaging myself where available.   MEDICAL HISTORY:  Travis Conrad is a 73 year old male, accompanied by his wife, seen in request by neurosurgeon Dr. Gillie Conrad, for evaluation of neck pain radiating pain to shoulder, chronic low back pain, his primary care is Dr. Ninetta Conrad at [facility] at [facility] point  History is obtained from the patient and review of electronic medical records. I personally reviewed pertinent  available imaging films in PACS.   PMHx of  A fib Chronic kidney disease HLD HTN CAD Left ankle surgery Hx of Colectomy, transient colostomy in 2022,  Smoke 1ppd Hard liquor before colectomy in 2022.  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, mother suffered Alzheimer's dementia  He used to drink about 500 cc of hard liquor on a daily basis, smoked a pack a day  Hospital admission in September 2022 for sepsis, blood pressure to 84/65, acute sigmoid diverticulitis, with extraluminal gas and fluid collection, consistent with perforation and abscess, ileus,  He underwent sigmoid colectomy on February 25, 2021, diverting colostomy, also complicated by postoperative anemia required blood transfusion,  Eventually had colostomy reversal in July 2024  He quit smoking and drinking since then, noted to have significant memory loss since there was prolonged hospital course, he lost his driver license many years ago due to drive under influence of alcohol, have not driven for years, rely on his wife to provide history  Since 2022 hospital admission, he also presented with acute left lower back pain, radiating pain to left lower extremity, that was helped by gabapentin  300 mg 3 times daily, with recent flareup of left foot pain, left hip pain despite same dose of gabapentin   In addition, since June 2025, he also complains of fairly acute onset of the left neck pain, radiating pain to left shoulder, left hand muscle spasm, left fourth finger standing  He has gait abnormality due to left knee pain, wife also reported that he  has slow worsening memory loss, agitations, MoCA examination 17/30 today,  Personally reviewed MRI of lumbar from [facility] orthopedic specialist October 2024, multilevel degenerative changes, most noticeable at L5-S1, severe right and moderate left facet arthropathy, moderate right, mild left foraminal stenosis, no evidence  of canal stenosis,  Lab since 2024, normal TSH, LFTs, CMP  He had a EMG nerve conduction study by Travis Conrad, reported peripheral neuropathy,  PHYSICAL EXAM:   Vitals:   01/11/24 0908  BP: 133/89  Pulse: 61  Weight: 220 lb (99.8 kg)  Height: 6' (1.829 m)     Body mass index is 29.84 kg/m.  PHYSICAL EXAMNIATION:  Gen: NAD, conversant, well nourised, well groomed                     Cardiovascular: Regular rate rhythm, no peripheral edema, warm, nontender. Eyes: Conjunctivae clear without exudates or hemorrhage Neck: Supple, no carotid bruits. Pulmonary: Clear to auscultation bilaterally   NEUROLOGICAL EXAM:  MENTAL STATUS: Speech/cognition: Anxious looking elderly male, tends to get frustrated, slow reaction time, rely on his wife to provide history,      01/11/2024    9:00 AM  Montreal Cognitive Assessment   Visuospatial/ Executive (0/5) 3  Naming (0/3) 3  Attention: Read list of digits (0/2) 2  Attention: Read list of letters (0/1) 1  Attention: Serial 7 subtraction starting at 100 (0/3) 1  Language: Repeat phrase (0/2) 1  Language : Fluency (0/1) 0  Abstraction (0/2) 2  Delayed Recall (0/5) 0  Orientation (0/6) 4  Total 17    CRANIAL NERVES: CN II: Visual fields are full to confrontation. Pupils are round equal and briskly reactive to light. CN III, IV, VI: extraocular movement are normal. No ptosis. CN V: Facial sensation is intact to light touch CN VII: Face is symmetric with normal eye closure  CN VIII: Hearing is normal to causal conversation. CN IX, X: Phonation is normal. CN XI: Head turning and shoulder shrug are intact  MOTOR: mild left shoulder abduction and extension weakness,   REFLEXES: Reflexes are 1 and symmetric at the biceps, triceps, knees, and absent ankles. Plantar responses are flexor.  SENSORY: mildly length dependent sensory changes.  COORDINATION: There is no trunk or limb dysmetria noted.  GAIT/STANCE: Push-up to get up from  seated position, antalgic, dragging left leg  REVIEW OF SYSTEMS:  Full 14 system review of systems performed and notable only for as above All other review of systems were negative.   ALLERGIES: Allergies  Allergen Reactions   Statins Other (See Comments)     Generalized body itching, generalized myalgias and groin pain.   Tape     Skin tears     HOME MEDICATIONS: Current Outpatient Medications  Medication Sig Dispense Refill   acetaminophen  (TYLENOL ) 325 MG tablet Take 2 tablets (650 mg total) by mouth every 6 (six) hours as needed for mild pain. 50 tablet 0   amiodarone  (PACERONE ) 200 MG tablet Take 0.5 tablets (100 mg total) by mouth daily. 45 tablet 3   diltiazem  (CARDIZEM  CD) 120 MG 24 hr capsule TAKE 1 CAPSULE(120 MG) BY MOUTH DAILY 90 capsule 3   ELIQUIS  5 MG TABS tablet TAKE 1 TABLET(5 MG) BY MOUTH TWICE DAILY 60 tablet 5   ezetimibe (ZETIA) 10 MG tablet Take 10 mg by mouth daily.     gabapentin  (NEURONTIN ) 300 MG capsule Take 300 mg by mouth 3 (three) times daily.     Melatonin 10 MG TBCR Take 10  mg by mouth at bedtime as needed (sleep).     nitroGLYCERIN  (NITROSTAT ) 0.4 MG SL tablet Place 1 tablet (0.4 mg total) under the tongue every 5 (five) minutes as needed for chest pain. 25 tablet 0   OVER THE COUNTER MEDICATION Take 1 tablet by mouth at bedtime as needed (sleep). Nature Made Back to sleep otc sleep aid     OVER THE COUNTER MEDICATION Take 2 tablets by mouth at bedtime Vicks Triple ZZZ o-t-c sleep aid  (3rd)     tamsulosin  (FLOMAX ) 0.4 MG CAPS capsule Take 0.4 mg by mouth daily.     zolpidem  (AMBIEN ) 10 MG tablet Take 10 mg by mouth at bedtime.     OVER THE COUNTER MEDICATION Take 2 capsules by mouth at bedtime. ZZZquil pure Zs sleep aid (2nd)     OVER THE COUNTER MEDICATION Take 1 tablet by mouth at bedtime as needed (sleep). 3 AM otc melatonin sleep aid     No current facility-administered medications for this visit.    PAST MEDICAL HISTORY: Past Medical  History:  Diagnosis Date   Arthritis    CAD (coronary artery disease) 08/03/2022   Pre PVI ablation CT 08/16/19: CAC score 1980 (94th percentile); LCx and LAD disease - FFR normal      Chronic kidney disease    stage 3   High cholesterol    RX made groin hurt (08/14/2016)   Hypertension    Paroxysmal atrial fibrillation (HCC) 08/14/2016   -S/p PVI ablation 09/2019 (Camnitz)  -TTE 02/23/21: EF 60-65, no RWMA, mod LVH, NL RVSF, trivial MR  -Amiodarone  Rx (started 02/2021 due to recurrent AF in setting of sepsis, diverticulitis)     Pre-diabetes     PAST SURGICAL HISTORY: Past Surgical History:  Procedure Laterality Date   ANKLE FRACTURE SURGERY Left ~ 2012   put rod and pins in   ATRIAL FIBRILLATION ABLATION N/A 10/03/2019   Procedure: ATRIAL FIBRILLATION ABLATION;  Surgeon: Inocencio Soyla Lunger, MD;  Location: [facility] INVASIVE CV LAB;  Service: Cardiovascular;  Laterality: N/A;   COLECTOMY N/A 02/26/2021   Procedure: PARTIAL COLECTOMY AND COLOSTOMY;  Surgeon: Stevie, Herlene Righter, MD;  Location: [facility] OR;  Service: General;  Laterality: N/A;   COLONOSCOPY W/ BIOPSIES AND POLYPECTOMY  ~ 2000   COLOSTOMY TAKEDOWN N/A 01/08/2023   Procedure: LAPAROSCOPIC COLOSTOMY REVERSAL WITH COLORECTAL ANASTOMOSIS, LYSIS OF ADHESIONS, PARTICIAL COLON RESECTION;  Surgeon: Kinsinger, Herlene Righter, MD;  Location: [facility] ORS;  Service: General;  Laterality: N/A;   FRACTURE SURGERY     LAPAROTOMY  02/26/2021   Procedure: EXPLORATORY LAPAROTOMY;  Surgeon: Stevie Herlene Righter, MD;  Location: [facility] OR;  Service: General;;   ORIF FIBULA FRACTURE  06/24/2012   Procedure: OPEN REDUCTION INTERNAL FIXATION (ORIF) FIBULA FRACTURE;  Surgeon: Ozell VEAR Bruch, MD;  Location: [facility] OR;  Service: Orthopedics;  Laterality: Left;  ORIF LEFT FIBULA,  POSSIBLE REPAIR OF SYNDESMOSIS    TEE WITHOUT CARDIOVERSION N/A 10/03/2019   Procedure: TRANSESOPHAGEAL ECHOCARDIOGRAM (TEE);  Surgeon: Inocencio Soyla Lunger, MD;  Location: [facility] INVASIVE CV LAB;   Service: Cardiovascular;  Laterality: N/A;    FAMILY HISTORY: Family History  Problem Relation Age of Onset   Alzheimer's disease Mother    Alzheimer's disease Brother    Cancer Brother    Cancer Brother    Heart disease Paternal Uncle    Parkinson's disease Paternal Uncle    Heart disease Paternal Uncle    Heart disease Paternal Uncle    Heart disease Paternal Uncle  Heart disease Paternal Uncle    Heart disease Paternal Uncle    Neuropathy Neg Hx     SOCIAL HISTORY: Social History   Socioeconomic History   Marital status: Married    Spouse name: Not on file   Number of children: Not on file   Years of education: Not on file   Highest education level: Not on file  Occupational History   Not on file  Tobacco Use   Smoking status: Former    Current packs/day: 0.00    Average packs/day: 1 pack/day for 50.0 years (50.0 ttl pk-yrs)    Types: Cigarettes    Start date: 02/18/1971    Quit date: 02/17/2021    Years since quitting: 2.8   Smokeless tobacco: Never   Tobacco comments:    1/2-1ppd  Vaping Use   Vaping status: Never Used  Substance and Sexual Activity   Alcohol use: Not Currently    Comment: 1/2 gallon of whiskey a week, stopped in 2022   Drug use: Not Currently    Types: Cocaine, Marijuana    Comment: last duse 40 years ago   Sexual activity: Not Currently  Other Topics Concern   Not on file  Social History Narrative   Lives at home with wife and daughter is there part time   Left handed   Caffeine : 1 per day    Social Drivers of Health   Financial Resource Strain: Not on file  Food Insecurity: No Food Insecurity (01/10/2023)   Hunger Vital Sign    Worried About Running Out of Food in the Last Year: Never true    Ran Out of Food in the Last Year: Never true  Transportation Needs: No Transportation Needs (01/10/2023)   PRAPARE - Administrator, Civil Service (Medical): No    Lack of Transportation (Non-Medical): No  Physical Activity: Not  on file  Stress: Not on file  Social Connections: Not on file  Intimate Partner Violence: Not At Risk (01/10/2023)   Humiliation, Afraid, Rape, and Kick questionnaire    Fear of Current or Ex-Partner: No    Emotionally Abused: No    Physically Abused: No    Sexually Abused: No      Modena Callander, M.D. Ph.D.  [facility] [address], [address]  [place], [place] [address] Ph: [phone] Fax: [phone]  CC:  Travis Duncans, MD [address] [place],  [place] [address]    Travis Conrad at [facility] at [employer] [phone] Fax [phone]

## 2024-01-16 ENCOUNTER — Telehealth: Payer: Self-pay | Admitting: Neurology

## 2024-01-16 NOTE — Telephone Encounter (Signed)
 [employer] medicare shara: [id number] exp. 01/16/24-02/14/24 sent to [facility] [phone]

## 2024-01-23 ENCOUNTER — Ambulatory Visit: Payer: Self-pay | Admitting: Neurology

## 2024-01-23 LAB — IMMUNOFIXATION ELECTROPHORESIS
IgA/Immunoglobulin A, Serum: 364 mg/dL (ref 61–437)
IgG (Immunoglobin G), Serum: 823 mg/dL (ref 603–1613)
IgM (Immunoglobulin M), Srm: 100 mg/dL (ref 15–143)
Total Protein: 7.1 g/dL (ref 6.0–8.5)

## 2024-01-23 LAB — CBC WITH DIFFERENTIAL/PLATELET
Basophils Absolute: 0.1 x10E3/uL (ref 0.0–0.2)
Basos: 1 %
EOS (ABSOLUTE): 0.3 x10E3/uL (ref 0.0–0.4)
Eos: 3 %
Hematocrit: 49.5 % (ref 37.5–51.0)
Hemoglobin: 15.9 g/dL (ref 13.0–17.7)
Immature Grans (Abs): 0 x10E3/uL (ref 0.0–0.1)
Immature Granulocytes: 0 %
Lymphocytes Absolute: 2.4 x10E3/uL (ref 0.7–3.1)
Lymphs: 22 %
MCH: 30.6 pg (ref 26.6–33.0)
MCHC: 32.1 g/dL (ref 31.5–35.7)
MCV: 95 fL (ref 79–97)
Monocytes Absolute: 0.7 x10E3/uL (ref 0.1–0.9)
Monocytes: 7 %
Neutrophils Absolute: 7.3 x10E3/uL — ABNORMAL HIGH (ref 1.4–7.0)
Neutrophils: 67 %
Platelets: 208 x10E3/uL (ref 150–450)
RBC: 5.2 x10E6/uL (ref 4.14–5.80)
RDW: 14.5 % (ref 11.6–15.4)
WBC: 10.7 x10E3/uL (ref 3.4–10.8)

## 2024-01-23 LAB — COMPREHENSIVE METABOLIC PANEL WITH GFR
ALT: 14 IU/L (ref 0–44)
AST: 18 IU/L (ref 0–40)
Albumin: 4.5 g/dL (ref 3.8–4.8)
Alkaline Phosphatase: 106 IU/L (ref 44–121)
BUN/Creatinine Ratio: 9 — ABNORMAL LOW (ref 10–24)
BUN: 12 mg/dL (ref 8–27)
Bilirubin Total: 0.4 mg/dL (ref 0.0–1.2)
CO2: 22 mmol/L (ref 20–29)
Calcium: 9.6 mg/dL (ref 8.6–10.2)
Chloride: 102 mmol/L (ref 96–106)
Creatinine, Ser: 1.36 mg/dL — ABNORMAL HIGH (ref 0.76–1.27)
Globulin, Total: 2.6 g/dL (ref 1.5–4.5)
Glucose: 99 mg/dL (ref 70–99)
Potassium: 5 mmol/L (ref 3.5–5.2)
Sodium: 139 mmol/L (ref 134–144)
eGFR: 55 mL/min/1.73 — ABNORMAL LOW (ref >59)

## 2024-01-23 LAB — CK: Total CK: 147 U/L (ref 41–331)

## 2024-01-23 LAB — APOE ALZHEIMER'S RISK

## 2024-01-23 LAB — C-REACTIVE PROTEIN: CRP: 17 mg/L — ABNORMAL HIGH (ref 0–10)

## 2024-01-23 LAB — RPR: RPR Ser Ql: NONREACTIVE

## 2024-01-23 LAB — TSH: TSH: 2.53 u[IU]/mL (ref 0.450–4.500)

## 2024-01-23 LAB — VITAMIN D 25 HYDROXY (VIT D DEFICIENCY, FRACTURES): Vit D, 25-Hydroxy: 24 ng/mL — ABNORMAL LOW (ref 30.0–100.0)

## 2024-01-23 LAB — VITAMIN B12: Vitamin B-12: 270 pg/mL (ref 232–1245)

## 2024-01-23 LAB — SEDIMENTATION RATE: Sed Rate: 14 mm/h (ref 0–30)

## 2024-01-23 LAB — HGB A1C W/O EAG: Hgb A1c MFr Bld: 6.2 % — ABNORMAL HIGH (ref 4.8–5.6)

## 2024-01-23 LAB — ANA W/REFLEX IF POSITIVE: Anti Nuclear Antibody (ANA): NEGATIVE

## 2024-01-23 LAB — HIV ANTIBODY (ROUTINE TESTING W REFLEX): HIV Screen 4th Generation wRfx: NONREACTIVE

## 2024-01-28 ENCOUNTER — Ambulatory Visit
Admission: RE | Admit: 2024-01-28 | Discharge: 2024-01-28 | Disposition: A | Discharge status: Home / Self Care | Source: Ambulatory Visit | Attending: Neurology | Admitting: Neurology

## 2024-01-28 DIAGNOSIS — M542 Cervicalgia: Secondary | ICD-10-CM | POA: Diagnosis not present

## 2024-01-28 DIAGNOSIS — M5412 Radiculopathy, cervical region: Secondary | ICD-10-CM | POA: Diagnosis not present

## 2024-01-28 DIAGNOSIS — M25552 Pain in left hip: Secondary | ICD-10-CM

## 2024-01-28 DIAGNOSIS — R269 Unspecified abnormalities of gait and mobility: Secondary | ICD-10-CM | POA: Diagnosis not present

## 2024-01-28 DIAGNOSIS — R4189 Other symptoms and signs involving cognitive functions and awareness: Secondary | ICD-10-CM

## 2024-01-28 DIAGNOSIS — M25551 Pain in right hip: Secondary | ICD-10-CM

## 2024-02-06 ENCOUNTER — Telehealth: Admitting: Neurology

## 2024-02-06 DIAGNOSIS — R269 Unspecified abnormalities of gait and mobility: Secondary | ICD-10-CM

## 2024-02-06 DIAGNOSIS — M542 Cervicalgia: Secondary | ICD-10-CM

## 2024-02-06 DIAGNOSIS — R4189 Other symptoms and signs involving cognitive functions and awareness: Secondary | ICD-10-CM | POA: Diagnosis not present

## 2024-02-06 MED ORDER — DULOXETINE HCL 60 MG PO CPEP: 60.0000 mg | ORAL_CAPSULE | Freq: Every day | ORAL | 5 refills | Status: AC

## 2024-02-06 NOTE — Progress Notes (Signed)
 No chief complaint on file.   Travis AND PLAN  LEGION DISCHER is a 73 y.o. male   History of heavy alcohol abuse, smoking, Sepsis due to sigmoid diverticulitis, perforation, abscess, status post colectomy in September 2022, Slow worsening memory loss since, strong family history of dementia, MoCA examination 17/30  MRI of the brain showed no acute abnormality mild small vessel disease, generalized atrophy more pronounced in the mesial temporal lobe,  Laboratory evaluation showed no treatable etiology for his complaints of memory loss,  APO E profile showed 1 copy of E4  His s memory loss likely combination of underlying central nervous system degenerative disorder, with chronic alcohol abuse, medical condition,  Acute onset of left neck pain radiating pain to left upper extremity  MRI of the cervical spine on January 29, 2024 showed multilevel degenerative changes, C5-6 moderate severe right foraminal narrowing, C6-7, moderate left foraminal narrowing, this might explain his neck pain radiating to the left upper extremity,  Chronic low back pain bilateral hip pain,  Would not explain by his MRI findings, most noticeable at L5-S1 with moderate right foraminal narrowing, no canal stenosis  X-ray of bilateral hip in August 2025 showed no acute abnormality,  Encouraging moderate exercise  Higher dose of gabapentin  up to 1800 mg a day, and Cymbalta  30 mg was helping, will further increase Cymbalta  to 60 mg daily  Continue follow-up with primary care, not a surgical candidate based on imaging study, if complains of worsening pain, may consider physical therapy, pain management  DIAGNOSTIC DATA (LABS, IMAGING, TESTING) - I reviewed patient records, labs, notes, testing and imaging myself where available.   MEDICAL HISTORY:  Travis Conrad is a 73 year old male, accompanied by his wife, seen in request by neurosurgeon Dr. Gillie Duncans, for evaluation of neck pain radiating pain to  shoulder, chronic low back pain, his primary care is Dr. Ninetta Amour at [facility] point  History is obtained from the patient and review of electronic medical records. I personally reviewed pertinent available imaging films in PACS.   PMHx of  A fib Chronic kidney disease HLD HTN CAD Left ankle surgery Hx of Colectomy, transient colostomy in 2022,  Smoke 1ppd Hard liquor before colectomy in 2022.  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, mother suffered Alzheimer's dementia  He used to drink about 500 cc of hard liquor on a daily basis, smoked a pack a day  Hospital admission in September 2022 for sepsis, blood pressure to 84/65, acute sigmoid diverticulitis, with extraluminal gas and fluid collection, consistent with perforation and abscess, ileus,  He underwent sigmoid colectomy on February 25, 2021, diverting colostomy, also complicated by postoperative anemia required blood transfusion,  Eventually had colostomy reversal in July 2024  He quit smoking and drinking since then, noted to have significant memory loss since there was prolonged hospital course, he lost his driver license many years ago due to drive under influence of alcohol, have not driven for years, rely on his wife to provide history  Since 2022 hospital admission, he also presented with acute left lower back pain, radiating pain to left lower extremity, that was helped by gabapentin  300 mg 3 times daily, with recent flareup of left foot pain, left hip pain despite same dose of gabapentin   In addition, since June 2025, he also complains of fairly acute onset of the left neck pain, radiating pain to left shoulder, left hand muscle  spasm, left fourth finger standing  He has gait abnormality due to left knee pain, wife also reported that he has slow worsening memory loss, agitations, MoCA examination 17/30 today,  Personally reviewed MRI of lumbar from  [facility] orthopedic specialist October 2024, multilevel degenerative changes, most noticeable at L5-S1, severe right and moderate left facet arthropathy, moderate right, mild left foraminal stenosis, no evidence of canal stenosis,  Lab since 2024, normal TSH, LFTs, CMP  He had a EMG nerve conduction study by Dr.Metha, reported peripheral neuropathy,   Virtual Visit via video UPDATE February 06, 2024 I discussed the limitations of evaluation and management by telemedicine and the availability of in person appointments. The patient expressed understanding and agreed to proceed  Location: Provider: [facility] office; Patient: Home  I connected with Lynwood MARLA Pouch  on February 06, 2024 by a video enabled telemedicine application and verified that I am speaking with the correct person using two identifiers.  UPDATED HiSTORY He is overall stable continue complains of neck pain radiating to left upper extremity, left knee pain, still able to do some yard work  We reviewed MRI via virtual sharing, mild generalized atrophy, more pronounced at bilateral mesial temporal lobe, mild small vessel disease  MRI of cervical spine, multilevel degenerative changes, most noticeable at lower cervical region C5-6, moderate left foraminal narrowing C4-5, moderate right foraminal narrowing, no cord compression  Laboratory evaluation for his complaints of memory loss showed mildly decreased vitamin D , otherwise normal TSH B12, RPR C-reactive protein, APO E 1 copy of E4   Observations/Objective: I have reviewed problem lists, medications, allergies. Awake, alert, oriented to history taking and casual conversation, facial symmetric, moving 4 extremity without difficulty, pushing up from seated position, steady    REVIEW OF SYSTEMS:  Full 14 system review of systems performed and notable only for as above All other review of systems were negative.   ALLERGIES: Allergies  Allergen Reactions   Statins Other (See  Comments)     Generalized body itching, generalized myalgias and groin pain.   Tape     Skin tears     HOME MEDICATIONS: Current Outpatient Medications  Medication Sig Dispense Refill   acetaminophen  (TYLENOL ) 325 MG tablet Take 2 tablets (650 mg total) by mouth every 6 (six) hours as needed for mild pain. 50 tablet 0   amiodarone  (PACERONE ) 200 MG tablet Take 0.5 tablets (100 mg total) by mouth daily. 45 tablet 3   diltiazem  (CARDIZEM  CD) 120 MG 24 hr capsule TAKE 1 CAPSULE(120 MG) BY MOUTH DAILY 90 capsule 3   DULoxetine  (CYMBALTA ) 30 MG capsule Take 1 capsule (30 mg total) by mouth daily. 30 capsule 6   ELIQUIS  5 MG TABS tablet TAKE 1 TABLET(5 MG) BY MOUTH TWICE DAILY 60 tablet 5   ezetimibe (ZETIA) 10 MG tablet Take 10 mg by mouth daily.     gabapentin  (NEURONTIN ) 300 MG capsule Take 2 capsules (600 mg total) by mouth 3 (three) times daily as needed. 180 capsule 11   Melatonin 10 MG TBCR Take 10 mg by mouth at bedtime as needed (sleep).     nitroGLYCERIN  (NITROSTAT ) 0.4 MG SL tablet Place 1 tablet (0.4 mg total) under the tongue every 5 (five) minutes as needed for chest pain. 25 tablet 0   OVER THE COUNTER MEDICATION Take 2 capsules by mouth at bedtime. ZZZquil pure Zs sleep aid (2nd)     OVER THE COUNTER MEDICATION Take 1 tablet by mouth at bedtime as needed (sleep). 3 AM  otc melatonin sleep aid     OVER THE COUNTER MEDICATION Take 1 tablet by mouth at bedtime as needed (sleep). Nature Made Back to sleep otc sleep aid     OVER THE COUNTER MEDICATION Take 2 tablets by mouth at bedtime Vicks Triple ZZZ o-t-c sleep aid  (3rd)     tamsulosin  (FLOMAX ) 0.4 MG CAPS capsule Take 0.4 mg by mouth daily.     zolpidem  (AMBIEN ) 10 MG tablet Take 10 mg by mouth at bedtime.     No current facility-administered medications for this visit.    PAST MEDICAL HISTORY: Past Medical History:  Diagnosis Date   Arthritis    CAD (coronary artery disease) 08/03/2022   Pre PVI ablation CT 08/16/19: CAC  score 1980 (94th percentile); LCx and LAD disease - FFR normal      Chronic kidney disease    stage 3   High cholesterol    RX made groin hurt (08/14/2016)   Hypertension    Paroxysmal atrial fibrillation (HCC) 08/14/2016   -S/p PVI ablation 09/2019 ([facility])  -TTE 02/23/21: EF 60-65, no RWMA, mod LVH, NL RVSF, trivial MR  -Amiodarone  Rx (started 02/2021 due to recurrent AF in setting of sepsis, diverticulitis)     Pre-diabetes     PAST SURGICAL HISTORY: Past Surgical History:  Procedure Laterality Date   ANKLE FRACTURE SURGERY Left ~ 2012   put rod and pins in   ATRIAL FIBRILLATION ABLATION N/A 10/03/2019   Procedure: ATRIAL FIBRILLATION ABLATION;  Surgeon: Inocencio Soyla Lunger, MD;  Location: [facility] INVASIVE CV LAB;  Service: Cardiovascular;  Laterality: N/A;   COLECTOMY N/A 02/26/2021   Procedure: PARTIAL COLECTOMY AND COLOSTOMY;  Surgeon: Stevie, Herlene Righter, MD;  Location: [facility] OR;  Service: General;  Laterality: N/A;   COLONOSCOPY W/ BIOPSIES AND POLYPECTOMY  ~ 2000   COLOSTOMY TAKEDOWN N/A 01/08/2023   Procedure: LAPAROSCOPIC COLOSTOMY REVERSAL WITH COLORECTAL ANASTOMOSIS, LYSIS OF ADHESIONS, PARTICIAL COLON RESECTION;  Surgeon: Kinsinger, Herlene Righter, MD;  Location: [facility] ORS;  Service: General;  Laterality: N/A;   FRACTURE SURGERY     LAPAROTOMY  02/26/2021   Procedure: EXPLORATORY LAPAROTOMY;  Surgeon: Stevie Herlene Righter, MD;  Location: [facility] OR;  Service: General;;   ORIF FIBULA FRACTURE  06/24/2012   Procedure: OPEN REDUCTION INTERNAL FIXATION (ORIF) FIBULA FRACTURE;  Surgeon: Ozell VEAR Bruch, MD;  Location: [facility] OR;  Service: Orthopedics;  Laterality: Left;  ORIF LEFT FIBULA,  POSSIBLE REPAIR OF SYNDESMOSIS    TEE WITHOUT CARDIOVERSION N/A 10/03/2019   Procedure: TRANSESOPHAGEAL ECHOCARDIOGRAM (TEE);  Surgeon: Inocencio Soyla Lunger, MD;  Location: [facility] INVASIVE CV LAB;  Service: Cardiovascular;  Laterality: N/A;    FAMILY HISTORY: Family History  Problem Relation Age of Onset   Alzheimer's  disease Mother    Alzheimer's disease Brother    Cancer Brother    Cancer Brother    Heart disease Paternal Uncle    Parkinson's disease Paternal Uncle    Heart disease Paternal Uncle    Heart disease Paternal Uncle    Heart disease Paternal Uncle    Heart disease Paternal Uncle    Heart disease Paternal Uncle    Neuropathy Neg Hx     SOCIAL HISTORY: Social History   Socioeconomic History   Marital status: Married    Spouse name: Not on file   Number of children: Not on file   Years of education: Not on file   Highest education level: Not on file  Occupational History   Not on file  Tobacco Use   Smoking status: Former    Current packs/day: 0.00    Average packs/day: 1 pack/day for 50.0 years (50.0 ttl pk-yrs)    Types: Cigarettes    Start date: 02/18/1971    Quit date: 02/17/2021    Years since quitting: 2.9   Smokeless tobacco: Never   Tobacco comments:    1/2-1ppd  Vaping Use   Vaping status: Never Used  Substance and Sexual Activity   Alcohol use: Not Currently    Comment: 1/2 gallon of whiskey a week, stopped in 2022   Drug use: Not Currently    Types: Cocaine, Marijuana    Comment: last duse 40 years ago   Sexual activity: Not Currently  Other Topics Concern   Not on file  Social History Narrative   Lives at home with wife and daughter is there part time   Left handed   Caffeine : 1 per day    Social Drivers of Health   Financial Resource Strain: Not on file  Food Insecurity: No Food Insecurity (01/10/2023)   Hunger Vital Sign    Worried About Running Out of Food in the Last Year: Never true    Ran Out of Food in the Last Year: Never true  Transportation Needs: No Transportation Needs (01/10/2023)   PRAPARE - Administrator, Civil Service (Medical): No    Lack of Transportation (Non-Medical): No  Physical Activity: Not on file  Stress: Not on file  Social Connections: Not on file  Intimate Partner Violence: Not At Risk (01/10/2023)    Humiliation, Afraid, Rape, and Kick questionnaire    Fear of Current or Ex-Partner: No    Emotionally Abused: No    Physically Abused: No    Sexually Abused: No      Modena Callander, M.D. Ph.D.  [facility] [address] [place], [place] [address] Ph: [phone] Fax: [phone]  CC:  Mansfield Vanleer, MD [address] [place],  [place] [address]    Ninetta Amour at [facility] [employer] [phone] Fax [phone]

## 2024-02-21 LAB — LAB REPORT - SCANNED
A1c: 6.5
EGFR: 54

## 2024-02-25 ENCOUNTER — Other Ambulatory Visit: Payer: Self-pay | Admitting: Cardiology

## 2024-03-06 ENCOUNTER — Ambulatory Visit (HOSPITAL_COMMUNITY)
Admission: RE | Admit: 2024-03-06 | Discharge: 2024-03-06 | Disposition: A | Discharge status: Home / Self Care | Source: Ambulatory Visit | Attending: Internal Medicine | Admitting: Internal Medicine

## 2024-03-06 ENCOUNTER — Encounter (HOSPITAL_COMMUNITY): Payer: Self-pay | Admitting: Internal Medicine

## 2024-03-06 VITALS — BP 146/76 | HR 76 | Ht 72.0 in | Wt 219.8 lb

## 2024-03-06 DIAGNOSIS — I480 Paroxysmal atrial fibrillation: Secondary | ICD-10-CM | POA: Diagnosis not present

## 2024-03-06 DIAGNOSIS — Z79899 Other long term (current) drug therapy: Secondary | ICD-10-CM

## 2024-03-06 DIAGNOSIS — D6869 Other thrombophilia: Secondary | ICD-10-CM

## 2024-03-06 DIAGNOSIS — Z5181 Encounter for therapeutic drug level monitoring: Secondary | ICD-10-CM

## 2024-03-06 MED ORDER — AMIODARONE HCL 200 MG PO TABS: 100.0000 mg | ORAL_TABLET | Freq: Every day | ORAL | Status: AC

## 2024-03-06 NOTE — Progress Notes (Signed)
 Primary Care Physician: Chinnasamy, Krishnamani, MD Primary Cardiologist: Dr Okey Primary Electrophysiologist: Dr Taylor/Dr Inocencio Referring Physician: Dr Waddell Travis Conrad Trudy is a 73 y.o. male with a history of HTN, alcohol abuse, tobacco use, atrial flutter, and paroxysmal atrial fibrillation who presents for follow up in the [facility]. Patient has been maintained on sotalol  but unfortunately continued to have symptomatic episodes of atrial fibrillation and atrial flutter. Patient is on Xarelto  for a CHADS2VASC score of 2. He underwent afib ablation with Dr Inocencio on 10/03/19.  On follow up 03/06/24, patient is here for amiodarone  surveillance. He has had overall very low Afib burden since last office visit with Dr. Inocencio. He is taking amiodarone  100 mg daily.   Today, he denies symptoms of chest pain, shortness of breath, orthopnea, PND, lower extremity edema, dizziness, presyncope, syncope, snoring, daytime somnolence, bleeding, or neurologic sequela. The patient is tolerating medications without difficulties and is otherwise without complaint today.    Atrial Fibrillation Risk Factors:  he does not have symptoms or diagnosis of sleep apnea. he does not have a history of rheumatic fever.   he has a BMI of Body mass index is 29.81 kg/m.SABRA Filed Weights   03/06/24 0910  Weight: 99.7 kg     Family History  Problem Relation Age of Onset   Alzheimer's disease Mother    Alzheimer's disease Brother    Cancer Brother    Cancer Brother    Heart disease Paternal Uncle    Parkinson's disease Paternal Uncle    Heart disease Paternal Uncle    Heart disease Paternal Uncle    Heart disease Paternal Uncle    Heart disease Paternal Uncle    Heart disease Paternal Uncle    Neuropathy Neg Hx      Atrial Fibrillation Management history:  Previous antiarrhythmic drugs: sotalol , amiodarone  Previous cardioversions: none Previous ablations:  10/03/19 CHADS2VASC score: 2 Anticoagulation history: Xarelto , Eliquis    Past Medical History:  Diagnosis Date   Arthritis    CAD (coronary artery disease) 08/03/2022   Pre PVI ablation CT 08/16/19: CAC score 1980 (94th percentile); LCx and LAD disease - FFR normal      Chronic kidney disease    stage 3   High cholesterol    RX made groin hurt (08/14/2016)   Hypertension    Paroxysmal atrial fibrillation (HCC) 08/14/2016   -S/p PVI ablation 09/2019 ([facility])  -TTE 02/23/21: EF 60-65, no RWMA, mod LVH, NL RVSF, trivial MR  -Amiodarone  Rx (started 02/2021 due to recurrent AF in setting of sepsis, diverticulitis)     Pre-diabetes    Past Surgical History:  Procedure Laterality Date   ANKLE FRACTURE SURGERY Left ~ 2012   put rod and pins in   ATRIAL FIBRILLATION ABLATION N/A 10/03/2019   Procedure: ATRIAL FIBRILLATION ABLATION;  Surgeon: Inocencio Soyla Lunger, MD;  [facility]: [facility] INVAS[facility];  Service: Cardiovascular;  Laterality: N/A;   COLECTOMY N/A 02/26/2021   Procedure: PARTIAL COLECTOMY AND COLOSTOMY;  Surgeon: Stevie, Herlene Righter, MD;  Location: [facility] OR;  Service: General;  Laterality: N/A;   COLONOSCOPY W/ BIOPSIES AND POLYPECTOMY  ~ 2000   COLOSTOMY TAKEDOWN N/A 01/08/2023   Procedure: LAPAROSCOPIC COLOSTOMY REVERSAL WITH COLORECTAL ANASTOMOSIS, LYSIS OF ADHESIONS, PARTICIAL COLON RESECTION;  Surgeon: Kinsinger, Herlene Righter, MD;  Location: [facility];  Service: General;  Laterality: N/A;   FRACTURE SURGERY     LAPAROTOMY  02/26/2021   Procedure: EXPLORATORY LAPAROTOMY;  Surgeon: Stevie Herlene  Beverley, MD;  Location: [facility] OR;  Service: General;;   ORIF FIBULA FRACTURE  06/24/2012   Procedure: OPEN REDUCTION INTERNAL FIXATION (ORIF) FIBULA FRACTURE;  Surgeon: Ozell VEAR Bruch, MD;  Location: [facility] OR;  Service: Orthopedics;  Laterality: Left;  ORIF LEFT FIBULA,  POSSIBLE REPAIR OF SYNDESMOSIS    TEE WITHOUT CARDIOVERSION N/A 10/03/2019   Procedure: TRANSESOPHAGEAL ECHOCARDIOGRAM (TEE);  Surgeon:  Inocencio Soyla Lunger, MD;  Location: [facility] INVASIVE CV LAB;  Service: Cardiovascular;  Laterality: N/A;    Current Outpatient Medications  Medication Sig Dispense Refill   acetaminophen  (TYLENOL ) 325 MG tablet Take 2 tablets (650 mg total) by mouth every 6 (six) hours as needed for mild pain. 50 tablet 0   diltiazem  (CARDIZEM  CD) 120 MG 24 hr capsule TAKE 1 CAPSULE(120 MG) BY MOUTH DAILY 90 capsule 3   DULoxetine  (CYMBALTA ) 60 MG capsule Take 1 capsule (60 mg total) by mouth daily. 30 capsule 5   ELIQUIS  5 MG TABS tablet TAKE 1 TABLET(5 MG) BY MOUTH TWICE DAILY 60 tablet 5   ezetimibe (ZETIA) 10 MG tablet Take 10 mg by mouth daily.     gabapentin  (NEURONTIN ) 300 MG capsule Take 2 capsules (600 mg total) by mouth 3 (three) times daily as needed. 180 capsule 11   Melatonin 10 MG TBCR Take 10 mg by mouth at bedtime as needed (sleep).     nitroGLYCERIN  (NITROSTAT ) 0.4 MG SL tablet Place 1 tablet (0.4 mg total) under the tongue every 5 (five) minutes as needed for chest pain. 25 tablet 0   OVER THE COUNTER MEDICATION Take 2 capsules by mouth at bedtime. ZZZquil pure Zs sleep aid (2nd)     OVER THE COUNTER MEDICATION Take 1 tablet by mouth at bedtime as needed (sleep). Nature Made Back to sleep otc sleep aid     OVER THE COUNTER MEDICATION Take 2 tablets by mouth at bedtime Vicks Triple ZZZ o-t-c sleep aid  (3rd)     tamsulosin  (FLOMAX ) 0.4 MG CAPS capsule Take 0.4 mg by mouth daily.     zolpidem  (AMBIEN ) 10 MG tablet Take 10 mg by mouth at bedtime.     amiodarone  (PACERONE ) 200 MG tablet Take 0.5 tablets (100 mg total) by mouth daily.     No current facility-administered medications for this encounter.    Allergies  Allergen Reactions   Statins Other (See Comments)     Generalized body itching, generalized myalgias and groin pain.   Tape     Skin tears    ROS- All systems are reviewed and negative except as per the HPI above.  Physical Exam: Vitals:   03/06/24 0910  BP: (!) 146/76   Pulse: 76  Weight: 99.7 kg  Height: 6' (1.829 m)    GEN- The patient is well appearing, alert and oriented x 3 today.   Neck - no JVD or carotid bruit noted Lungs- Clear to ausculation bilaterally, normal work of breathing Heart- Regular rate and rhythm, no murmurs, rubs or gallops, PMI not laterally displaced Extremities- no clubbing, cyanosis, or edema Skin - no rash or ecchymosis noted   Wt Readings from Last 3 Encounters:  03/06/24 99.7 kg  01/11/24 99.8 kg  09/04/23 95.7 kg    EKG today demonstrates  Vent. rate 76 BPM PR interval 202 ms QRS duration 86 ms QT/QTcB 386/434 ms P-R-T axes 90 70 77 Normal sinus rhythm Normal ECG When compared with ECG of 04-Sep-2023 09:12, Premature atrial complexes are no longer Present  Echo 02/23/21: 1.  Left ventricular ejection fraction, by estimation, is 60 to 65%. The  left ventricle has normal function. The left ventricle has no regional  wall motion abnormalities. There is moderate left ventricular hypertrophy  of the basal-septal segment. Left  ventricular diastolic parameters were normal.   2. Right ventricular systolic function is normal. The right ventricular  size is normal. Tricuspid regurgitation signal is inadequate for assessing  PA pressure.   3. The mitral valve is normal in structure. Trivial mitral valve  regurgitation. No evidence of mitral stenosis.   4. The aortic valve is normal in structure. Aortic valve regurgitation is  not visualized. No aortic stenosis is present.   Epic records are reviewed at length today  CHA2DS2-VASc Score = 2  The patient's score is based upon: CHF History: 0 HTN History: 1 Diabetes History: 0 Stroke History: 0 Vascular Disease History: 0 Age Score: 1 Gender Score: 0      ASSESSMENT AND PLAN: 1. Paroxysmal Atrial Fibrillation/atrial flutter The patient's CHA2DS2-VASc score is 2, indicating a 2.2% annual risk of stroke.   S/p afib ablation on 10/03/19.   Patient is  currently in NSR. He is overall happy with current management and is not interested in a repeat ablation at this time.  High risk medication monitoring (ICD10: [id number]) Patient requires ongoing monitoring for anti-arrhythmic medication which has the potential to cause life threatening arrhythmias or AV block. Qtc stable. Continue amiodarone  100 mg daily. Cmet and TSH from July are stable.   2. Secondary Hypercoagulable State (ICD10:  D68.69) The patient is at significant risk for stroke/thromboembolism based upon his CHA2DS2-VASc Score of 2.  Continue Rivaroxaban  (Xarelto ).  Continue Eliquis .   3. HTN Stable today.   Follow up 6 months for amiodarone  surveillance.   Dorn Heinrich, PA-C Afib Clinic [facility] [address] [place], [place] [address] [phone] 03/06/2024 9:29 AM

## 2024-03-07 ENCOUNTER — Ambulatory Visit: Admitting: Internal Medicine

## 2024-04-02 ENCOUNTER — Ambulatory Visit: Admitting: Internal Medicine

## 2024-04-02 ENCOUNTER — Encounter: Payer: Self-pay | Admitting: Internal Medicine

## 2024-04-02 VITALS — BP 116/62 | HR 68 | Temp 98.2°F | Ht 72.0 in | Wt 217.4 lb

## 2024-04-02 DIAGNOSIS — Z7189 Other specified counseling: Secondary | ICD-10-CM

## 2024-04-02 DIAGNOSIS — E1165 Type 2 diabetes mellitus with hyperglycemia: Secondary | ICD-10-CM | POA: Diagnosis not present

## 2024-04-02 DIAGNOSIS — Z5987 Material hardship due to limited financial resources, not elsewhere classified: Secondary | ICD-10-CM

## 2024-04-02 DIAGNOSIS — M1990 Unspecified osteoarthritis, unspecified site: Secondary | ICD-10-CM

## 2024-04-02 DIAGNOSIS — F5101 Primary insomnia: Secondary | ICD-10-CM | POA: Diagnosis not present

## 2024-04-02 DIAGNOSIS — I480 Paroxysmal atrial fibrillation: Secondary | ICD-10-CM

## 2024-04-02 DIAGNOSIS — Z79899 Other long term (current) drug therapy: Secondary | ICD-10-CM

## 2024-04-02 DIAGNOSIS — N1831 Chronic kidney disease, stage 3a: Secondary | ICD-10-CM

## 2024-04-02 DIAGNOSIS — F1027 Alcohol dependence with alcohol-induced persisting dementia: Secondary | ICD-10-CM

## 2024-04-02 DIAGNOSIS — R269 Unspecified abnormalities of gait and mobility: Secondary | ICD-10-CM

## 2024-04-02 DIAGNOSIS — G629 Polyneuropathy, unspecified: Secondary | ICD-10-CM

## 2024-04-02 DIAGNOSIS — E7800 Pure hypercholesterolemia, unspecified: Secondary | ICD-10-CM

## 2024-04-02 MED ORDER — TIRZEPATIDE 2.5 MG/0.5ML ~~LOC~~ SOAJ: 2.5000 mg | SUBCUTANEOUS | 11 refills | Status: AC

## 2024-04-02 MED ORDER — ZOLPIDEM TARTRATE 10 MG PO TABS: 10.0000 mg | ORAL_TABLET | Freq: Every day | ORAL | 5 refills | Status: AC

## 2024-04-02 NOTE — Assessment & Plan Note (Signed)
 Moderate memory loss with multiple other comorbidities and limited finances make it challenging for patient; they declined by multidisciplinary care recommendations for sports medicine, orthopedic. They may cut back on nephrology.   They plan continue(s) with neurology.  They declined sleep medicine or ENT recommendations.

## 2024-04-02 NOTE — Assessment & Plan Note (Signed)
 Chronic insomnia is managed with Ambien  and melatonin. Risks of Ambien  dependence and potential for worsening memory loss were discussed. He reports difficulty sleeping without Ambien . Establish a controlled substance contract for Ambien  prescription and discuss potential reduction of Ambien  dose to 5 mg in the future. Consider referral to a sleep specialist if he is willing and ENT- but he declined both due to cost.    Complex contract signed with both patient and caregiver wife agreeing to significant risks of continued Ambien  and assurances given about safety- patient will not be able to drive and doors locked at night due to wandering risks.

## 2024-04-02 NOTE — Assessment & Plan Note (Signed)
 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 Continue current management with the nephrologist and consider reducing nephrology visits to once a year if well-managed, in coordination with the nephrologist.

## 2024-04-02 NOTE — Assessment & Plan Note (Signed)
 Atrial fibrillation is managed with Eliquis  and amiodarone , with regular monitoring of thyroid  function due to amiodarone  use. Continue Eliquis  5 mg twice daily and amiodarone  200 mg daily. Monitor thyroid  function every six months.

## 2024-04-02 NOTE — Assessment & Plan Note (Signed)
 Chronic polyneuropathy is managed with gabapentin  and duloxetine .

## 2024-04-02 NOTE — Assessment & Plan Note (Signed)
 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 Discussed risks including vision loss, cardiovascular events, and neuropathy. He has a sweet tooth and consumes sugary drinks. Discuss starting Mounjaro for diabetes management, weight loss, and cognitive benefits, pending insurance approval. Refer to a clinical pharmacist to explore cost-saving options for medications.

## 2024-04-02 NOTE — Assessment & Plan Note (Signed)
 Hyperlipidemia is not at goal despite the current medication regimen. Recent lab work indicates cholesterol levels are not adequately controlled. Continue current lipid-lowering therapy and monitor lipid levels regularly.

## 2024-04-02 NOTE — Progress Notes (Signed)
 [facility]  Phone: [phone]  - Medical Office Visit -  Visit Date: 04/02/2024 Patient: Travis Conrad   DOB: 05-Jul-1950   73 y.o. Male  MRN: [id number] Patient Care Team: Jesus Bernardino MATSU, MD as PCP - General (Internal Medicine) Okey Vina GAILS, MD as PCP - Cardiology (Cardiology) Inocencio Soyla Lunger, MD as PCP - Electrophysiology (Cardiology) Today's Health Care Provider: Bernardino MATSU Jesus, MD  ===========================================    Chief Complaint / Reason for Visit: Establish Care (Establishing Care. Disbanding from [facility] due to patient preference. New bilateral tinnitus for several months)  Background: 73 y.o. male who has Paroxysmal atrial fibrillation (HCC); Hypercholesterolemia without hypertriglyceridemia; Essential hypertension; Diverticulitis; Epididymitis; Hyponatremia; Adjustment disorder with mixed anxiety and depressed mood; CAD (coronary artery disease); Stage 3a chronic kidney disease (HCC); Contracture of palmar fascia; Rheumatoid factor positive; Sciatica; Skin sensation disturbance; Left cervical radiculopathy; Gait abnormality; Bilateral hip pain; Cognitive impairment; Polyneuropathy; Arthritis; High risk medication use; Material hardship due to limited financial resources; Coordination of complex care; Moderate dementia associated with alcoholism, without behavioral disturbance, psychotic disturbance, mood disturbance, or anxiety (HCC); Primary insomnia; and Type 2 diabetes mellitus with hyperglycemia, without long-term current use of insulin  (HCC) on their problem list. Discussed the use of AI scribe software for clinical note transcription with the patient, who gave verbal consent to proceed.  History of Present Illness 73 year old male with early dementia who presents with worsening memory loss. He is accompanied by his wife/caregiver.   They have no savings per patient and survive on ssi only, and want to limit medical  expenses.  He has been experiencing worsening memory loss, which he attributes to a severe infection following a colonoscopy three years ago. His memory issues began after a prolonged hospital stay due to an intestinal rupture and subsequent sepsis. He describes his memory as 'foggy' and states that he is 'real forgetful now.' His wife confirms that his memory has been deteriorating since the hospitalization. His family history includes Alzheimer's disease in his mother, which raises his concern about his memory loss worsening and its impact on his daily life.  He following with neurology which attributes to history alcohol use and sepsis history and notes that its progressive.  He has a history of a ruptured colon, which led to sepsis and required a colostomy, later reversed two years post-surgery. He was hospitalized for two months during this period. He is currently on Eliquis  for atrial fibrillation and has a history of hypercoagulable state during sepsis. He also takes amiodarone  for atrial fibrillation and reports regular thyroid  monitoring due to this medication.  He experiences arthritis-related knee pain, which he describes as persistent despite taking acetaminophen  and other pain medications. He has tried various treatments, including physical therapy and steroid injections, without significant relief.  He has seen sports medicine and orthopedic and does not wish to return - citing that their efforts didn't help and financial limitations.  He also reports ringing in his ears and occasional neck pain, which he attributes to nerve issues. He has a history of polyneuropathy and sciatica, with occasional left-sided neck pain. He takes duloxetine  and gabapentin  for nerve pain, which he reports has improved significantly. He also mentions a gait abnormality, which his wife attributes to a long-standing leg length discrepancy and arthritis in his knee.  He has a history of chronic kidney disease, stage  2-3, with a GFR of 54. He sees a nephrologist regularly and is on Flomax  for kidney-related issues. Recent  lab work indicates the onset of diabetes, which he describes as 'barely' present, and he acknowledges a sweet tooth.  He reports significant sleep disturbances, requiring Ambien  and melatonin to sleep. He describes waking frequently at night and using a pee bottle in the past due to frequent urination. No history of sleep apnea testing.  Problem overviews updated today: Problem  Polyneuropathy  Arthritis  High Risk Medication Use  Material Hardship Due to Limited Financial Resources  Coordination of Complex Care  Moderate Dementia Associated With Alcoholism, Without Behavioral Disturbance, Psychotic Disturbance, Mood Disturbance, Or Anxiety (Hcc)  Primary Insomnia  Type 2 Diabetes Mellitus With Hyperglycemia, Without Long-Term Current Use of Insulin  (Hcc)  Left Cervical Radiculopathy  Diverticulitis  Hypercholesterolemia Without Hypertriglyceridemia  Irritant Contact Dermatitis Associated With Fecal Stoma (Resolved)  Colostomy Care (Hcc) (Resolved)  Acute Blood Loss Anemia (Resolved)  Sepsis (Hcc) (Resolved)  Intra-Abdominal Abscess (Hcc) (Resolved)  Severe Sepsis With Acute Organ Dysfunction (Hcc) (Resolved)  Secondary Hypercoagulable State (Resolved)  Diverticulitis of Intestine With Abscess (Resolved)    Medications updated/reviewed: Current Outpatient Medications on File Prior to Visit  Medication Sig   acetaminophen  (TYLENOL ) 325 MG tablet Take 2 tablets (650 mg total) by mouth every 6 (six) hours as needed for mild pain.   amiodarone  (PACERONE ) 200 MG tablet Take 0.5 tablets (100 mg total) by mouth daily.   diltiazem  (CARDIZEM  CD) 120 MG 24 hr capsule TAKE 1 CAPSULE(120 MG) BY MOUTH DAILY   DULoxetine  (CYMBALTA ) 60 MG capsule Take 1 capsule (60 mg total) by mouth daily.   ELIQUIS  5 MG TABS tablet TAKE 1 TABLET(5 MG) BY MOUTH TWICE DAILY   ezetimibe (ZETIA) 10 MG tablet  Take 10 mg by mouth daily.   gabapentin  (NEURONTIN ) 300 MG capsule Take 2 capsules (600 mg total) by mouth 3 (three) times daily as needed.   Melatonin 10 MG TBCR Take 10 mg by mouth at bedtime as needed (sleep).   nitroGLYCERIN  (NITROSTAT ) 0.4 MG SL tablet Place 1 tablet (0.4 mg total) under the tongue every 5 (five) minutes as needed for chest pain.   OVER THE COUNTER MEDICATION Take 2 capsules by mouth at bedtime. ZZZquil pure Zs sleep aid (2nd)   OVER THE COUNTER MEDICATION Take 1 tablet by mouth at bedtime as needed (sleep). Nature Made Back to sleep otc sleep aid   OVER THE COUNTER MEDICATION Take 2 tablets by mouth at bedtime Vicks Triple ZZZ o-t-c sleep aid  (3rd)   tamsulosin  (FLOMAX ) 0.4 MG CAPS capsule Take 0.4 mg by mouth daily.   No current facility-administered medications on file prior to visit.   Medications Discontinued During This Encounter  Medication Reason   DULoxetine  (CYMBALTA ) 30 MG capsule    zolpidem  (AMBIEN ) 10 MG tablet Reorder   Current Meds  Medication Sig   acetaminophen  (TYLENOL ) 325 MG tablet Take 2 tablets (650 mg total) by mouth every 6 (six) hours as needed for mild pain.   amiodarone  (PACERONE ) 200 MG tablet Take 0.5 tablets (100 mg total) by mouth daily.   diltiazem  (CARDIZEM  CD) 120 MG 24 hr capsule TAKE 1 CAPSULE(120 MG) BY MOUTH DAILY   DULoxetine  (CYMBALTA ) 60 MG capsule Take 1 capsule (60 mg total) by mouth daily.   ELIQUIS  5 MG TABS tablet TAKE 1 TABLET(5 MG) BY MOUTH TWICE DAILY   ezetimibe (ZETIA) 10 MG tablet Take 10 mg by mouth daily.   gabapentin  (NEURONTIN ) 300 MG capsule Take 2 capsules (600 mg total) by mouth 3 (three) times daily as  needed.   Melatonin 10 MG TBCR Take 10 mg by mouth at bedtime as needed (sleep).   nitroGLYCERIN  (NITROSTAT ) 0.4 MG SL tablet Place 1 tablet (0.4 mg total) under the tongue every 5 (five) minutes as needed for chest pain.   OVER THE COUNTER MEDICATION Take 2 capsules by mouth at bedtime. ZZZquil pure Zs  sleep aid (2nd)   OVER THE COUNTER MEDICATION Take 1 tablet by mouth at bedtime as needed (sleep). Nature Made Back to sleep otc sleep aid   OVER THE COUNTER MEDICATION Take 2 tablets by mouth at bedtime Vicks Triple ZZZ o-t-c sleep aid  (3rd)   tamsulosin  (FLOMAX ) 0.4 MG CAPS capsule Take 0.4 mg by mouth daily.   tirzepatide (MOUNJARO) 2.5 MG/0.5ML Pen Inject 2.5 mg into the skin once a week.   [DISCONTINUED] DULoxetine  (CYMBALTA ) 30 MG capsule Take 30 mg by mouth daily.   [DISCONTINUED] zolpidem  (AMBIEN ) 10 MG tablet Take 10 mg by mouth at bedtime.    Allergies:  Statins and Tape Past Medical History:  has a past medical history of Acute blood loss anemia, Arthritis, CAD (coronary artery disease) (08/03/2022), Chronic kidney disease, Diverticulitis of intestine with abscess (11/19/2016), High cholesterol, Hypertension, Intra-abdominal abscess (HCC), Irritant contact dermatitis associated with fecal stoma (03/31/2022), Paroxysmal atrial fibrillation (HCC) (08/14/2016), Pre-diabetes, Secondary hypercoagulable state (11/03/2019), Sepsis (HCC) (04/06/2021), and Severe sepsis with acute organ dysfunction (HCC) (02/20/2021). Past Surgical History:   has a past surgical history that includes ORIF fibula fracture (06/24/2012); Fracture surgery; Colonoscopy w/ biopsies and polypectomy (~ 2000); Ankle fracture surgery (Left, ~ 2012); ATRIAL FIBRILLATION ABLATION (N/A, 10/03/2019); TEE without cardioversion (N/A, 10/03/2019); Colectomy (N/A, 02/26/2021); laparotomy (02/26/2021); and Colostomy takedown (N/A, 01/08/2023). Social History:   reports that he quit smoking about 3 years ago. His smoking use included cigarettes. He started smoking about 53 years ago. He has a 50 pack-year smoking history. He has never used smokeless tobacco. He reports that he does not currently use alcohol. He reports that he does not currently use drugs after having used the following drugs: Cocaine and Marijuana. Family History:  family  history includes Alzheimer's disease in his brother and mother; Cancer in his brother and brother; Heart disease in his paternal uncle, paternal uncle, paternal uncle, paternal uncle, paternal uncle, and paternal uncle; Parkinson's disease in his paternal uncle. Depression Screen and Health Maintenance:     No data to display         Health Maintenance  Topic Date Due   Medicare Annual Wellness (AWV)  Never done   FOOT EXAM  Never done   OPHTHALMOLOGY EXAM  Never done   Diabetic kidney evaluation - Urine ACR  Never done   Hepatitis C Screening  Never done   DTaP/Tdap/Td (1 - Tdap) Never done   Pneumococcal Vaccine: 50+ Years (1 of 2 - PCV) Never done   Zoster Vaccines- Shingrix (1 of 2) Never done   Colonoscopy  Never done   Lung Cancer Screening  06/28/2021   Influenza Vaccine  Never done   COVID-19 Vaccine (5 - 2025-26 season) 02/18/2024   HEMOGLOBIN A1C  07/13/2024   Diabetic kidney evaluation - eGFR measurement  01/10/2025   Meningococcal B Vaccine  Aged Out   Immunization History  Administered Date(s) Administered   PFIZER(Purple Top)SARS-COV-2 Vaccination 09/12/2019, 10/08/2019, 04/05/2020     Objective   Physical ExamBP 116/62   Pulse 68   Temp 98.2 F (36.8 C)   Ht 6' (1.829 m)   Wt 217 lb  6.4 oz (98.6 kg)   SpO2 96%   BMI 29.48 kg/m  Wt Readings from Last 10 Encounters:  04/02/24 217 lb 6.4 oz (98.6 kg)  03/06/24 219 lb 12.8 oz (99.7 kg)  01/11/24 220 lb (99.8 kg)  09/04/23 211 lb (95.7 kg)  06/26/23 214 lb (97.1 kg)  04/09/23 211 lb 12.8 oz (96.1 kg)  03/26/23 214 lb 6.4 oz (97.3 kg)  01/13/23 220 lb 0.3 oz (99.8 kg)  01/01/23 212 lb (96.2 kg)  04/12/22 206 lb 12.8 oz (93.8 kg)  Vital signs reviewed.  Nursing notes reviewed. Weight trend reviewed. General Appearance:  Well developed, well nourished, well-groomed, healthy-appearing male with Body mass index is 29.48 kg/m. No acute distress appreciable.   Skin: Clear and well-hydrated. Pulmonary:   Normal work of breathing at rest, no respiratory distress apparent. SpO2: 96 %  Musculoskeletal: He demonstrates smooth and coordinated movements throughout all major joints.All extremities are intact.  Neurological:  Awake, alert, oriented, and engaged.  No obvious focal neurological deficits or cognitive impairments.  Sensorium seems unclouded.  Psychiatric:  Appropriate mood, pleasant and cooperative demeanor, cheerful and engaged during the exam  Reviewed Results & Data Results LABS GFR: 54 Thyroid  function: Normal (July 2025) Cholesterol: Elevated Kidney function: Stage 2-3 kidney disease Diabetes confirmation: Confirmed diabetes (September 2025)  RADIOLOGY MRI: Cervical spine degeneration (August 2025)  No results found for any visits on 04/02/24.  Office Visit on 01/11/2024  Component Date Value   Vitamin B-12 01/11/2024 270    RPR Ser Ql 01/11/2024 Non Reactive    CRP 01/11/2024 17 (H)    TSH 01/11/2024 2.530    Glucose 01/11/2024 99    BUN 01/11/2024 12    Creatinine, Ser 01/11/2024 1.36 (H)    eGFR 01/11/2024 55 (L)    BUN/Creatinine Ratio 01/11/2024 9 (L)    Sodium 01/11/2024 139    Potassium 01/11/2024 5.0    Chloride 01/11/2024 102    CO2 01/11/2024 22    Calcium  01/11/2024 9.6    Albumin 01/11/2024 4.5    Globulin, Total 01/11/2024 2.6    Bilirubin Total 01/11/2024 0.4    Alkaline Phosphatase 01/11/2024 106    AST 01/11/2024 18    ALT 01/11/2024 14    WBC 01/11/2024 10.7    RBC 01/11/2024 5.20    Hemoglobin 01/11/2024 15.9    Hematocrit 01/11/2024 49.5    MCV 01/11/2024 95    MCH 01/11/2024 30.6    MCHC 01/11/2024 32.1    RDW 01/11/2024 14.5    Platelets 01/11/2024 208    Neutrophils 01/11/2024 67    Lymphs 01/11/2024 22    Monocytes 01/11/2024 7    Eos 01/11/2024 3    Basos 01/11/2024 1    Neutrophils Absolute 01/11/2024 7.3 (H)    Lymphocytes Absolute 01/11/2024 2.4    Monocytes Absolute 01/11/2024 0.7    EOS (ABSOLUTE) 01/11/2024 0.3     Basophils Absolute 01/11/2024 0.1    Immature Granulocytes 01/11/2024 0    Immature Grans (Abs) 01/11/2024 0.0    Vit D, 25-Hydroxy 01/11/2024 24.0 (L)    Anti Nuclear Antibody (A* 01/11/2024 Negative    Total CK 01/11/2024 147    HIV Screen 4th Generatio* 01/11/2024 Non Reactive    Sed Rate 01/11/2024 14    Hgb A1c MFr Bld 01/11/2024 6.2 (H)    Total Protein 01/11/2024 7.1    Immunofixation Result, S* 01/11/2024 Comment (A)    IgG (Immunoglobin G), Se* 01/11/2024 823    IgA/Immunoglobulin  A, Se* 01/11/2024 364    IgM (Immunoglobulin M), * 01/11/2024 100    Methodology: 01/11/2024 Comment    APO E Genotyping Result: 01/11/2024 E3/E3    Interpretation: 01/11/2024 Comment    Comment: 01/11/2024 Comment   Scanned Document on 01/02/2024  Component Date Value   EGFR 01/01/2024 54.0   Office Visit on 06/26/2023  Component Date Value   Glucose 06/26/2023 110 (H)    BUN 06/26/2023 17    Creatinine, Ser 06/26/2023 1.17    eGFR 06/26/2023 66    BUN/Creatinine Ratio 06/26/2023 15    Sodium 06/26/2023 140    Potassium 06/26/2023 5.0    Chloride 06/26/2023 103    CO2 06/26/2023 26    Calcium  06/26/2023 9.6    Total Protein 06/26/2023 6.5    Albumin 06/26/2023 4.2    Globulin, Total 06/26/2023 2.3    Bilirubin Total 06/26/2023 0.4    Alkaline Phosphatase 06/26/2023 113    AST 06/26/2023 14    ALT 06/26/2023 15    TSH 06/26/2023 <0.005 (L)    Free T4 06/26/2023 2.84 (H)    No image results found.   MR CERVICAL SPINE WO CONTRAST Result Date: 01/29/2024  [facility] [address] [place], [place] [address] [phone] NEUROIMAGING REPORT STUDY DATE: 01/28/2024 PATIENT NAME: REHAN HOLNESS DOB: 10-05-1950 MRN: [id number] EXAM: MRI of the cervical spine ORDERING CLINICIAN: Modena Callander MD, PhD CLINICAL HISTORY: 73 year old man with neck pain, cervical radiculopathy and gait disturbance COMPARISON FILMS: None TECHNIQUE: MRI of the cervical spine was obtained  utilizing 3 mm sagittal slices from the posterior fossa down to the T3-4 level with T1, T2 and inversion recovery views. In addition 4 mm axial slices from C2-3 down to T1-2 level were included with T2 and gradient echo views. CONTRAST: None IMAGING SITE: [facility] [place] [facility] FINDINGS: :  On sagittal images, the spine is imaged from above the cervicomedullary junction to T2.   The visible brain and the cervicomedullary junction appears normal.  Paravertebral soft tissue appears normal.  The spinal cord is of normal caliber and signal.   The vertebral bodies are normally aligned.   Endplate degenerative changes are noted at C4-C5, C5-C6 and C6-C7.  Otherwise, vertebral bodies have normal signal.  The discs and interspaces were further evaluated on axial views from C2 to T1 as follows: C2-C3: There is a small right disc osteophyte complex causing mild right foraminal narrowing but no spinal stenosis or nerve root compression. C3-C4: There are small disc osteophyte complexes causing moderate right and mild left foraminal narrowing but no stenosis or nerve root compression. C4-C5: There is reduced disc height, endplate degenerative changes, disc bulging, right disc osteophyte complex and minimal ligamenta flava hypertrophy.  This causes moderately severe right foraminal narrowing with some potential for right C5 nerve root compression.  There is mild foraminal narrowing on the left.  No spinal stenosis. C5-C6: There is reduced disc height, endplate degenerative changes, disc bulging, uncovertebral spurring, mild facet hypertrophy combining to cause mild right and moderate left bilateral foraminal narrowing but no spinal stenosis or nerve root compression C6-C7: There is disc bulging, endplate degenerative change and minimal uncovertebral spurring causing mild to moderate left and minimal right foraminal narrowing.  There is no spinal stenosis or nerve root compression. C7-T1: This level is unremarkable.   This MRI of  the cervical spine without contrast shows the following: The spinal cord appears normal. There is no spinal stenosis. Multilevel degenerative changes with  varying degrees of foraminal narrowing as detailed above.  At C4-C5 there is moderately severe right foraminal narrowing with some potential for right C5 nerve root compression.  At C5-C6 there is moderate left foraminal narrowing though there does not appear to be nerve root compression. INTERPRETING PHYSICIAN: Richard A. Vear, MD, PhD, FAAN Certified in  Neuroimaging by [employer]   MR BRAIN WO CONTRAST Result Date: 01/29/2024  [facility] [address] [place], [place] [address] [phone] NEUROIMAGING REPORT STUDY DATE: 01/28/2024 PATIENT NAME: LAKENDRICK PARADIS DOB: 11-Feb-1951 MRN: [id number] EXAM: MRI Brain without contrast ORDERING CLINICIAN: Modena Callander MD, PhD CLINICAL HISTORY: 73 year old man with gait disturbance and cognitive disturbance COMPARISON FILMS: CT 06/28/2020 TECHNIQUE: MRI of the brain without contrast was obtained utilizing 5 mm axial slices with T1, T2, T2 flair, SWI and diffusion weighted views.  T1 sagittal and T2 coronal views were obtained. CONTRAST: none IMAGING SITE: [facility], [address] [place], [place] FINDINGS: On sagittal images, the spinal cord is imaged caudally to C3 and is normal in caliber.   The contents of the posterior fossa are of normal size and position.   The pituitary gland and optic chiasm appear normal.  There is mild generalized cortical atrophy, mildly more pronounced in the medial temporal lobes.  Mildly progressed compared to the 06/28/2020 CT scan.  There are no abnormal extra-axial collections of fluid.  The cerebellum and brainstem appears normal.   The deep gray matter appears normal.  In the cerebral hemispheres, there are scattered T2/FLAIR hyperintense foci predominantly in the subcortical and deep white matter.  None of these appear to be  acute..  Diffusion weighted images are normal.  Susceptibility weighted images are normal.  There have been bilateral lens replacements.  Otherwise, the orbits appear normal.   The VIIth/VIIIth nerve complex appears normal.  The mastoid air cells appear normal.  Mild mucoperiosteal thickening is noted in the left hemisphenoid and a couple of the ethmoid air cells.  The other paranasal sinuses appear normal.  Flow voids are identified within the major intracerebral arteries.     This MRI of the brain without contrast shows the following: Mild generalized cortical atrophy, a little more pronounced in the medial temporal lobes.  The atrophy has slightly progressed compared to the CT scan from 2022. Scattered T2/FLAIR hyperintense foci in the cerebral hemispheres consistent with mild chronic microvascular ischemic change.  None of the foci appear to be acute. No acute findings. INTERPRETING PHYSICIAN: Richard A. Vear, MD, PhD, FAAN Certified in  Neuroimaging by [employer]    DG HIPS BILAT WITH PELVIS 3-4 VIEWS Result Date: 01/18/2024 CLINICAL DATA:  Bilateral hip pain for several years, initial encounter EXAM: DG HIP (WITH OR WITHOUT PELVIS) 3V BILAT COMPARISON:  None Available. FINDINGS: Pelvic ring is intact. No acute fracture or dislocation is noted. No soft tissue abnormality is seen. IMPRESSION: No acute abnormality noted. Electronically Signed   By: Oneil Devonshire M.D.   On: 01/18/2024 01:11    No results found.    ASSESSMENT & PLAN   Assessment & Plan Arthritis Chronic osteoarthritis of the knee causes significant pain. Previous treatments including physical therapy and injections were ineffective. He reports high tolerance to pain medications. Consider referral back to an orthopedist if pain becomes unmanageable and continue current pain management strategies. Type 2 diabetes mellitus with hyperglycemia, without long-term current use of insulin  (HCC) Recently diagnosed with  mild type 2 diabetes mellitus. Discussed risks  including vision loss, cardiovascular events, and neuropathy. He has a sweet tooth and consumes sugary drinks. Discuss starting Mounjaro for diabetes management, weight loss, and cognitive benefits, pending insurance approval. Refer to a clinical pharmacist to explore cost-saving options for medications. Primary insomnia High risk medication use Chronic insomnia is managed with Ambien  and melatonin. Risks of Ambien  dependence and potential for worsening memory loss were discussed. He reports difficulty sleeping without Ambien . Establish a controlled substance contract for Ambien  prescription and discuss potential reduction of Ambien  dose to 5 mg in the future. Consider referral to a sleep specialist if he is willing and ENT- but he declined both due to cost.    Complex contract signed with both patient and caregiver wife agreeing to significant risks of continued Ambien  and assurances given about safety- patient will not be able to drive and doors locked at night due to wandering risks. Moderate dementia associated with alcoholism, without behavioral disturbance, psychotic disturbance, mood disturbance, or anxiety (HCC) Dementia is worsening, likely due to past sepsis and chronic alcohol use, with deteriorating memory loss possibly linked to a central nervous system degenerative disorder. There is a family history of Alzheimer's. Sleep apnea may contribute to worsening dementia. He declined a sleep study at this time. Discussed potential referral to ENT for nasal evaluation and possible coblation procedure to improve breathing and sleep quality as sleep apnea has not been assessed and he snores.  17/30 on last moca from neurology and gross interaction today seems appropriate to that score. Paroxysmal atrial fibrillation (HCC) Atrial fibrillation is managed with Eliquis  and amiodarone , with regular monitoring of thyroid  function due to amiodarone  use. Continue  Eliquis  5 mg twice daily and amiodarone  200 mg daily. Monitor thyroid  function every six months. Stage 3a chronic kidney disease (HCC) Chronic kidney disease stage 2-3 with GFR of 54 is likely related to past sepsis and is managed by a nephrologist with regular follow-ups. Continue current management with the nephrologist and consider reducing nephrology visits to once a year if well-managed, in coordination with the nephrologist. Hypercholesterolemia without hypertriglyceridemia Hyperlipidemia is not at goal despite the current medication regimen. Recent lab work indicates cholesterol levels are not adequately controlled. Continue current lipid-lowering therapy and monitor lipid levels regularly. Polyneuropathy Chronic polyneuropathy is managed with gabapentin  and duloxetine . Gait abnormality Gait abnormality is possibly related to past medical history including leg length discrepancy and arthritis. No recent falls reported. Monitor gait stability and reassess if symptoms worsen. Material hardship due to limited financial resources Coordination of complex care Moderate memory loss with multiple other comorbidities and limited finances make it challenging for patient; they declined by multidisciplinary care recommendations for sports medicine, orthopedic. They may cut back on nephrology.   They plan continue(s) with neurology.  They declined sleep medicine or ENT recommendations.   ORDER ASSOCIATIONS  #   DIAGNOSIS / CONDITION ICD-10 ENCOUNTER ORDER     ICD-10-CM   1. Arthritis  M19.90     2. Type 2 diabetes mellitus with hyperglycemia, without long-term current use of insulin  (HCC)  E11.65 tirzepatide (MOUNJARO) 2.5 MG/0.5ML Pen    AMB Referral [facility]    3. Primary insomnia  F51.01 AMB Referral [facility]    zolpidem  (AMBIEN ) 10 MG tablet    4. Moderate dementia associated with alcoholism, without behavioral disturbance, psychotic disturbance, mood disturbance, or  anxiety (HCC)  F10.27     5. Paroxysmal atrial fibrillation (HCC)  I48.0     6. Stage 3a chronic kidney disease (  HCC)  N18.31     7. Hypercholesterolemia without hypertriglyceridemia  E78.00     8. Polyneuropathy  G62.9     9. Gait abnormality  R26.9     10. Coordination of complex care  Z71.89     11. Material hardship due to limited financial resources  Z59.87     12. High risk medication use  Z79.899      ED Discharge Orders          Ordered    tirzepatide [facility]) 2.5 MG/0.5ML Pen  Weekly       Note to Pharmacy: Provider change - new pcp   04/02/24 1100    AMB Referral [facility]        04/02/24 1100    zolpidem  (AMBIEN ) 10 MG tablet  Daily at bedtime        04/02/24 1100          Diagnoses and all orders for this visit: Arthritis Type 2 diabetes mellitus with hyperglycemia, without long-term current use of insulin  (HCC) -     tirzepatide (MOUNJARO) 2.5 MG/0.5ML Pen; Inject 2.5 mg into the skin once a week. -     AMB Referral [facility] Primary insomnia -     AMB Referral [facility] -     zolpidem  (AMBIEN ) 10 MG tablet; Take 1 tablet (10 mg total) by mouth at bedtime. Moderate dementia associated with alcoholism, without behavioral disturbance, psychotic disturbance, mood disturbance, or anxiety (HCC) Paroxysmal atrial fibrillation (HCC) Stage 3a chronic kidney disease (HCC) Hypercholesterolemia without hypertriglyceridemia Polyneuropathy Gait abnormality Coordination of complex care Material hardship due to limited financial resources High risk medication use     Recommended follow up: 1-3 months, but due to financial concern(s), they only agreed to 6 months requirement for continued Ambien . Future Appointments  Date Time Provider Department Center  05/22/2024 10:30 AM Onita Duos, MD [facility] None  09/04/2024  9:30 AM Terra Fairy PARAS, PA-C [facility]  10/01/2024  9:00 AM Jesus Bernardino MATSU, MD [facility]              Additional notes: This document was synthesized by artificial intelligence ([employer]) using HIPAA-compliant recording of the clinical interaction;   We discussed the use of AI scribe software for clinical note transcription with the patient, who gave verbal consent to proceed.    Additional Info: This encounter employed state-of-the-art, real-time, collaborative documentation. The patient actively reviewed and assisted in updating their electronic medical record on a shared screen, ensuring transparency and facilitating joint problem-solving for the problem list, overview, and plan. This approach promotes accurate, informed care. The treatment plan was discussed and reviewed in detail, including medication safety, potential side effects, and all patient questions. We confirmed understanding and comfort with the plan. Follow-up instructions were established, including contacting the office for any concerns, returning if symptoms worsen, persist, or new symptoms develop, and precautions for potential emergency department visits.  Initial Appointment Goals:  This initial visit focused on establishing a foundation for the patient's care. We collaboratively reviewed his medical history and medications in detail, updating the chart as shown in the encounter. Given the extensive information, we prioritized addressing his most pressing concerns, which he reported were: Establish Care (Establishing Care. Disbanding from [facility] due to patient preference. New bilateral tinnitus for several months)  While the complexity of the patient's medical picture may necessitate further evaluation in subsequent visits, we were able to develop a preliminary care plan together. To  expedite a comprehensive plan at the next visit, we encouraged the patient to gather relevant medical records from previous providers. This collaborative approach will ensure a more complete understanding of the patient's health and  inform the development of a personalized care plan. We look forward to continuing the conversation and working together with the patient on achieving his health goals.   Collaborative Documentation:  Today's encounter utilized real-time, dynamic patient engagement.  Patients actively participate by directly reviewing and assisting in updating their medical records through a shared screen. This transparency empowers patients to visually confirm chart updates made by the healthcare provider.  This collaborative approach facilitates problem management as we jointly update the problem list, problem overview, and assessment/plan. Ultimately, this process enhances chart accuracy and completeness, fostering shared decision-making, patient education, and informed consent for tests and treatments.  Collaborative Treatment Planning:  Treatment plans were discussed and reviewed in detail.  Explained medication safety and potential side effects.  Encouraged participation and answered all patient questions, confirming understanding and comfort with the plan. Encouraged patient to contact our office if they have any questions or concerns. Agreed on patient returning to office if symptoms worsen, persist, or new symptoms develop.  ----------------------------------------------------- Bernardino KANDICE Cone, MD  04/02/2024 6:31 PM  [facility]:  [phone]

## 2024-04-02 NOTE — Assessment & Plan Note (Signed)
 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 There is a family history of Alzheimer's. Sleep apnea may contribute to worsening dementia. He declined a sleep study at this time. Discussed potential referral to ENT for nasal evaluation and possible coblation procedure to improve breathing and sleep quality as sleep apnea has not been assessed and he snores.  17/30 on last moca from neurology and gross interaction today seems appropriate to that score.

## 2024-04-02 NOTE — Assessment & Plan Note (Signed)
 Chronic osteoarthritis of the knee causes significant pain. Previous treatments including physical therapy and injections were ineffective. He reports high tolerance to pain medications. Consider referral back to an orthopedist if pain becomes unmanageable and continue current pain management strategies.

## 2024-04-02 NOTE — Assessment & Plan Note (Signed)
 Gait abnormality is possibly related to past medical history including leg length discrepancy and arthritis. No recent falls reported. Monitor gait stability and reassess if symptoms worsen.

## 2024-04-02 NOTE — Patient Instructions (Addendum)
 VISIT SUMMARY: During today's visit, we discussed your worsening memory loss, which you believe started after a severe infection following a colonoscopy three years ago. We also reviewed your other health concerns, including insomnia, atrial fibrillation, chronic kidney disease, type 2 diabetes, hyperlipidemia, polyneuropathy, osteoarthritis of the knee, and gait abnormality. Your wife accompanied you and provided additional insights into your symptoms.  YOUR PLAN: -DEMENTIA: Dementia is a condition that affects your memory and thinking skills. Your memory loss is worsening, possibly due to past sepsis and chronic alcohol use. We discussed the potential impact of sleep apnea on your dementia and the possibility of a referral to an ENT specialist for a nasal evaluation to improve your breathing and sleep quality.  -INSOMNIA: Insomnia is difficulty falling or staying asleep. You are currently managing it with Ambien  and melatonin. We discussed the risks of Ambien  dependence and the potential for reducing your dose to 5 mg in the future. We also talked about the possibility of seeing a sleep specialist.  -ATRIAL FIBRILLATION: Atrial fibrillation is an irregular and often rapid heart rate. You are managing it with Eliquis  and amiodarone , and we will continue to monitor your thyroid  function every six months due to the amiodarone .  -CHRONIC KIDNEY DISEASE STAGE 2-3: Chronic kidney disease means your kidneys are not working as well as they should. Your condition is being managed by a nephrologist, and we will continue with the current management plan. If your condition remains stable, we may consider reducing your nephrology visits to once a year.  -TYPE 2 DIABETES MELLITUS: Type 2 diabetes is a condition where your blood sugar levels are too high. We discussed the risks associated with diabetes and the possibility of starting Mounjaro for better management, pending insurance approval. We will also refer you  to a clinical pharmacist to explore cost-saving options for your medications.  -HYPERLIPIDEMIA: Hyperlipidemia means you have high levels of fats in your blood. Your cholesterol levels are not yet at the desired goal, so we will continue your current medication and monitor your lipid levels regularly.  -POLYNEUROPATHY: Polyneuropathy is a condition that affects multiple nerves in your body. You are managing it with gabapentin  and duloxetine , which have been effective.  -OSTEOARTHRITIS OF KNEE: Osteoarthritis is a condition that causes pain and stiffness in your joints. Your knee pain is persistent despite various treatments. If the pain becomes unmanageable, we may refer you back to an orthopedist.  -GAIT ABNORMALITY: Gait abnormality means you have an unusual walking pattern. This may be related to your leg length discrepancy and arthritis. We will monitor your gait stability and reassess if your symptoms worsen.  INSTRUCTIONS: Please follow up with your nephrologist as scheduled and consider reducing visits to once a year i f your condition remains stable. We will monitor your thyroid  function every six months due to your amiodarone  use. If you are willing, consider seeing a sleep specialist for your insomnia. We will also refer you to a clinical pharmacist to explore cost-saving options for your diabetes medication. If your knee pain becomes unmanageable, we may refer you back to an orthopedist.  Welcome aboard!   Today's visit was a valuable first step in understanding your health and starting your personalized care journey. We discussed your medical history and medications in detail. Given the extensive information, we prioritized addressing your most pressing concerns.  We understood those concerns to be:  Establish Care (Establishing Care. Disbanding from [facility] due to patient preference. New bilateral tinnitus for several months)  Building a Complete Picture  To create the most  effective care plan possible, we may need additional information from previous providers. We encouraged you to gather any relevant medical records for your next visit. This will help us  build a more complete picture and develop a personalized plan together. In the meantime, we'll address your immediate concerns and provide resources to help you manage all of your medical issues.  We encourage you to use MyChart to review these efforts, and to help us  find and correct any omissions or errors in your medical chart.  Managing Your Health Over Time  Managing every aspect of your health in a single visit isn't always feasible, but that's okay.  We addressed your most pressing concerns today and charted a course for future care. Acute conditions or preventive care measures may require further attention.  We encourage you to schedule a follow-up visit at your earliest convenience to discuss any unresolved issues.  We strongly encourage participation in annual preventive care visits to help us  develop a more thorough understanding of your health and to help you maintain optimal wellness - please inquire about scheduling your next one with us  at your earliest convenience.  Your Satisfaction Matters  It was a pleasure seeing you today!  Your health and satisfaction will always be my top priorities. If you believe your experience today was worthy of a 5-star rating, I'd be grateful for your feedback!  Bernardino KANDICE Cone, MD   Next Steps  Schedule Follow-Up:  We recommend a follow-up appointment in No follow-ups on file. If your condition worsens before then, please call us  or seek emergency care. Preventive Care:  Don't forget to schedule your annual preventive care visit!  This important checkup is typically covered by insurance and helps identify potential health issues early.  Typically its 100% insurance covered with no co-pay and helps to get surveillance labwork paid for through your insurance provider.   Sometimes it even lowers your insurance premiums to participate. Medical Information Release:  For any relevant medical information we don't have, please sign a release form so we can obtain it for your records. Lab & X-ray Appointments:  Scheduled any incomplete lab tests today or call us  to schedule.  X-Rays can be done without an appointment at [facility] at [facility] ([address]), M-F 8:30am-noon or 1pm-5pm.  Just tell them you're there for X-rays ordered by Dr. Cone.  We'll receive the results and contact you by phone or MyChart to discuss next steps.  Bring to Your Next Appointment  Medications: Please bring all your medication bottles to your next appointment to ensure we have an accurate record of your prescriptions. Health Diaries: If you're monitoring any health conditions at home, keeping a diary of your readings can be very helpful for discussions at your next appointment.  Reviewing Your Records  Please Review this early draft of your clinical notes below and the final encounter summary tomorrow on MyChart after its been completed.   Arthritis  Type 2 diabetes mellitus with hyperglycemia, without long-term current use of insulin  (HCC) -     Tirzepatide; Inject 2.5 mg into the skin once a week.  Dispense: 2 mL; Refill: 11 -     AMB Referral [facility] Care Management  Primary insomnia -     AMB Referral [facility] Care Management -     Zolpidem  Tartrate; Take 1 tablet (10 mg total) by mouth at bedtime.  Dispense: 30 tablet; Refill: 5  Moderate  dementia associated with alcoholism, without behavioral disturbance, psychotic disturbance, mood disturbance, or anxiety (HCC)  Paroxysmal atrial fibrillation (HCC)  Stage 3a chronic kidney disease (HCC)  Hypercholesterolemia without hypertriglyceridemia  Polyneuropathy  Gait abnormality  Coordination of complex care  Material hardship due to limited financial resources     Getting Answers and Following  Up  Simple Questions & Concerns: For quick questions or basic follow-up after your visit, reach us  at [phone] or MyChart messaging. Complex Concerns: If your concern is more complex, scheduling an appointment might be best. Discuss this with the staff to find the most suitable option. Lab & Imaging Results: We'll contact you directly if results are abnormal or you don't use MyChart. Most normal results will be on MyChart within 2-3 business days, with a review message from Dr. Jesus. Haven't heard back in 2 weeks? Need results sooner? Contact us  at [phone]. Referrals: Our referral coordinator will manage specialist referrals. The specialist's office should contact you within 2 weeks to schedule an appointment. Call us  if you haven't heard from them after 2 weeks.  Staying Connected  MyChart: Activate your MyChart for the fastest way to access results and message us . See the last page of this paperwork for instructions.  Billing  X-ray & Lab Orders: These are billed by separate companies. Contact the invoicing company directly for questions or concerns. Visit Charges: Discuss any billing inquiries with our administrative services team.  Feedback & Satisfaction  Share Your Experience: We strive for your satisfaction! If you have any complaints, please let Dr. Jesus know directly or contact our Practice Administrators, Manuelita Rubin or [employer], by asking at the front desk.  Scheduling Tips  Shorter Wait Times: 8 am and 1 pm appointments often have the quickest wait times. Longer Appointments: If you need more time during your visit, talk to the front desk. Due to insurance regulations, multiple back-to-back appointments might be necessary.     Nasal Obstruction and Turbinate Reduction This information is provided to help you understand nasal obstruction caused by enlarged turbinates and the treatment options available with ENT specialists. Understanding Your Nasal  Passages     Nasal turbinates are structures inside your nose that help warm, filter, and humidify the air you breathe. When these become enlarged (turbinate hypertrophy), they can block airflow and cause significant breathing problems.  Do You Have These Symptoms? Common Symptoms Impact on Daily Life  ? Chronic nasal congestion or stuffiness ? Difficulty breathing through your nose ? Breathing through your mouth, especially at night ? Snoring or disrupted sleep ? Recurrent sinus infections ? Reduced sense of smell or taste  ? Sleep disturbances or fatigue ? Dry mouth upon waking ? Headaches, especially in morning ? Difficulty with physical activity ? Persistent use of nasal sprays with limited relief ? Decreased quality of life   Common Causes    Allergies (seasonal or year-round)    Environmental irritants (smoke, pollution)    Chronic sinusitis    Anatomical variations    Medication side effects When Medical Management Isn't Enough First-Line Treatments Second-Line Treatments Specialist Treatments   Nasal saline irrigation  Nasal steroid sprays  Antihistamines  Allergy management  Environmental controls   Prescription nasal sprays  Oral steroids (short course)  Immunotherapy  Extended allergy testing  Sleep positioning aids   Nasal endoscopy evaluation  Coblation turbinate reduction  Other turbinate procedures  Septoplasty (if needed)  Complete airway assessment   About Coblation Turbinate Reduction PROCEDURE OVERVIEW: Coblation Turbinate Reduction  is a minimally invasive procedure performed by ENT specialists to reduce enlarged nasal turbinates. Unlike traditional surgery, coblation uses low-temperature radiofrequency energy with saline to gently reduce tissue size while preserving the important functions of the turbinates.    Benefits Patients Typically Experience Benefit What to Realistically Expect  Improved Nasal Breathing Most patients report significant  improvement in their ability to breathe through the nose, especially at night. The degree of improvement varies by individual, with many experiencing 70-90% improvement in nasal airflow.  Better Sleep Quality Patients often report reduced snoring, less nighttime awakenings, and feeling more rested in the morning. Partners frequently notice the difference in sleep breathing patterns.  Reduced Medication Dependence Many patients are able to reduce their reliance on nasal sprays and decongestants. Some may still need occasional use during allergy seasons or upper respiratory infections.  Improved Exercise Tolerance Better nasal breathing often translates to improved comfort during physical activity. Patients frequently report they no longer need to gasp for air through their mouth during exercise.  Decreased Sinus Pressure Patients with chronic sinus pressure or fullness often experience relief as the improved airflow allows better sinus drainage. This may reduce the frequency of sinus infections for some patients.  Convenience Factors The procedure is typically performed in-office under local anesthesia, takes only 10-15 minutes, and most patients return to normal activities within 1-2 days. No external incisions are made.  Patient Experiences     For years, I couldn't breathe through my nose, especially at night. After the procedure, I noticed improvement within a couple of weeks. Now I can actually sleep without a mouth guard and wake up without a dry throat. It wasn't a miracle cure--I still have some congestion during allergy season--but the difference in my daily quality of life has been substantial. -- Patient, 41, procedure performed 14 months ago       The procedure itself was quick and only mildly uncomfortable. Recovery was easier than I expected--just some stuffiness for a few days. The biggest change has been during exercise. I can finally breathe through my nose while running, which has  made my workouts much more comfortable. -- Patient, 35, procedure performed 8 months ago  Timeline of Improvement First Week Initial healing phase. Some congestion and mild discomfort is normal. Patients may not notice significant breathing improvement yet.  2-3 Weeks Most patients begin to notice improved breathing as initial swelling subsides. This is when many first experience the benefits of the procedure.  1-2 Months Continued improvement as internal healing completes. The majority of patients experience their maximum benefit during this period.  Long-term Benefits typically last several years. Some patients may experience gradual return of symptoms over time, particularly if allergies or other underlying causes are not well controlled.  What to Expect After Referral Initial ENT Visit Evaluation Procedure (if appropriate) Follow-up   Discussion of symptoms  Medical history review  Physical examination  Nasal endoscopy   Assessment of turbinate size  Airflow evaluation  Rule out other causes  Treatment options discussed   Local anesthesia  Brief in-office procedure  Minimal discomfort  Same-day return home   1-2 week checkup  Assessment of improvement  Additional care if needed  Long-term management plan   Recovery Process RECOVERY EXPECTATIONS: Most patients experience mild congestion for a few days after the procedure. Nasal saline sprays and avoiding strenuous activity for 1 week are typically recommended. Full benefits may take 2-4 weeks as healing completes.  When to Consider Specialist Referral Consider ENT Referral  If:   Nasal obstruction persists despite 4-6 weeks of medical therapy  Patient requires daily nasal decongestants (risk of rebound congestion)  Symptoms significantly impact sleep, daily activities, or quality of life  Physical examination shows significant turbinate hypertrophy  Patient experiences recurrent sinusitis (3+ episodes per year)  Chronic mouth  breathing is causing dental or oral issues   Insurance Information Most insurance plans cover this procedure when medically necessary. Prior authorization may be required. Our office staff can help coordinate referrals and insurance verification. Patient Questions & Concerns Is the procedure painful? Most patients report minimal discomfort during and after the procedure. Will I need to miss work? Most patients return to work within 1-2 [email or web address] How long do results last? Results typically last several years, though some patients may need follow-up treatment. Will my insurance cover it? Most insurance plans cover this procedure when medically necessary. Important Warning Signs CONTACT YOUR ENT SPECIALIST IMMEDIATELY IF YOU EXPERIENCE: Heavy bleeding that doesn't stop with gentle pressure Severe pain not controlled by prescribed medication Signs of infection (fever, increasing pain, foul discharge) Difficulty breathing     REFERRAL INFORMATION   Referral Process Our office will submit the referral electronically. Please allow 3-5 business days for the ENT office to contact you for scheduling.   What to [employer] of current medications  Previous imaging results (if any)  Allergy testing results (if completed)  Diary of failed efforts to improve nasal airflow.  Designer, fashion/clothing of Otolaryngology: [email or web address]    Allergy & Asthma Network: www.allergyasthmanetwork.org    American Sleep Association: www.sleepassociation.org

## 2024-04-04 ENCOUNTER — Telehealth: Payer: Self-pay

## 2024-04-04 NOTE — Progress Notes (Signed)
 Complex Care Management Note Care Guide Note  04/04/2024 Name: Travis Conrad MRN: [id number] DOB: 1951/01/22   Complex Care Management Outreach Attempts: An unsuccessful telephone outreach was attempted today to offer the patient information about available complex care management services.  Follow Up Plan:  Additional outreach attempts will be made to offer the patient complex care management information and services.   Encounter Outcome:  No Answer  .Debbe Fuse [facility], [facility] Guide  Direct Dial: [phone]  Fax [phone]

## 2024-04-07 NOTE — Progress Notes (Unsigned)
 Complex Care Management Note Care Guide Note  04/07/2024 Name: Travis Conrad MRN: [id number] DOB: [birth year]   Complex Care Management Outreach Attempts: A second unsuccessful outreach was attempted today to offer the patient with information about available complex care management services.  Follow Up Plan:  Additional outreach attempts will be made to offer the patient complex care management information and services.   Encounter Outcome:  No Answer  Thedford Franks, CMA [place]  [facility], [facility] Guide Direct Dial: [phone]  Fax: [phone] Website: [email or web address]

## 2024-04-08 NOTE — Progress Notes (Signed)
 Complex Care Management Note Care Guide Note  04/08/2024 Name: Travis Conrad MRN: [id number] DOB: [birth year]   Complex Care Management Outreach Attempts: A third unsuccessful outreach was attempted today to offer the patient with information about available complex care management services.  Follow Up Plan:  No further outreach attempts will be made at this time. We have been unable to contact the patient to offer or enroll patient in complex care management services.  Encounter Outcome:  No Answer  Thedford Franks, CMA [place]  [facility], [facility] Guide Direct Dial: [phone]  Fax: [phone] Website: [email or web address]

## 2024-04-19 ENCOUNTER — Other Ambulatory Visit: Payer: Self-pay | Admitting: Physician Assistant

## 2024-04-19 DIAGNOSIS — I480 Paroxysmal atrial fibrillation: Secondary | ICD-10-CM

## 2024-04-21 NOTE — Telephone Encounter (Signed)
 Pt last saw Fairy Heinrich, PA on 03/06/24, last labs 01/11/24 Creat 1.36, age 73, weight 98.6kg, based on specified criteria pt is on appropriate dosage of Eliquis  5mg  BID for afib.  Will refill rx.

## 2024-04-27 ENCOUNTER — Ambulatory Visit: Payer: Self-pay | Admitting: Internal Medicine

## 2024-04-28 ENCOUNTER — Telehealth: Payer: Self-pay | Admitting: Neurology

## 2024-04-28 NOTE — Telephone Encounter (Signed)
 Dr. Onita- are you ok with him following with PCP and here prn? Last saw you 02/06/24 and next f/u scheduled for 05/22/24 currently.  You prescribe: gabapentin  300mg , 2 capsules TID prn and duloxetine  60mg , 1 capsule daily.

## 2024-04-28 NOTE — Telephone Encounter (Signed)
 Pt's wife is asking if since pt's pcp can fill the prescribed medication Dr Onita wants him on, is the upcoming appointment needed with her, please call.

## 2024-04-28 NOTE — Telephone Encounter (Signed)
 Continue follow-up with primary care, not a surgical candidate based on imaging study, if complains of worsening pain, may consider physical therapy, pain management  Ok to cancel follow up visit, that was the decision at last visit, Ok for pcp continue gabapentin 

## 2024-04-29 NOTE — Telephone Encounter (Signed)
 Call to patient, no answer left voicemail and sent MyChart message as well

## 2024-05-22 ENCOUNTER — Ambulatory Visit: Admitting: Neurology

## 2024-06-18 ENCOUNTER — Other Ambulatory Visit: Payer: Self-pay | Admitting: Cardiology

## 2024-06-20 MED ORDER — DILTIAZEM HCL ER COATED BEADS 120 MG PO CP24: ORAL_CAPSULE | ORAL | 0 refills | Status: AC

## 2024-07-02 LAB — LAB REPORT - SCANNED
Albumin, Urine POC: 22.3
Creatinine, POC: 239.2 mg/dL
EGFR: 56
Microalb Creat Ratio: 9

## 2024-09-04 ENCOUNTER — Ambulatory Visit (HOSPITAL_COMMUNITY): Admitting: Internal Medicine

## 2024-10-01 ENCOUNTER — Ambulatory Visit: Admitting: Internal Medicine
